# Patient Record
Sex: Female | Born: 1935 | Race: White | Hispanic: No | Smoking: Former smoker
Health system: Southern US, Community
[De-identification: ages and names within clinical notes are randomized; demographics above are authoritative.]

## PROBLEM LIST (undated history)

## (undated) DIAGNOSIS — G2581 Restless legs syndrome: Secondary | ICD-10-CM

## (undated) DIAGNOSIS — Z8719 Personal history of other diseases of the digestive system: Secondary | ICD-10-CM

## (undated) DIAGNOSIS — G629 Polyneuropathy, unspecified: Secondary | ICD-10-CM

## (undated) DIAGNOSIS — I2510 Atherosclerotic heart disease of native coronary artery without angina pectoris: Secondary | ICD-10-CM

## (undated) DIAGNOSIS — I219 Acute myocardial infarction, unspecified: Secondary | ICD-10-CM

## (undated) DIAGNOSIS — H409 Unspecified glaucoma: Secondary | ICD-10-CM

## (undated) DIAGNOSIS — E785 Hyperlipidemia, unspecified: Secondary | ICD-10-CM

## (undated) DIAGNOSIS — E039 Hypothyroidism, unspecified: Secondary | ICD-10-CM

## (undated) DIAGNOSIS — I509 Heart failure, unspecified: Secondary | ICD-10-CM

## (undated) DIAGNOSIS — M1990 Unspecified osteoarthritis, unspecified site: Secondary | ICD-10-CM

## (undated) DIAGNOSIS — N6019 Diffuse cystic mastopathy of unspecified breast: Secondary | ICD-10-CM

## (undated) DIAGNOSIS — I4891 Unspecified atrial fibrillation: Secondary | ICD-10-CM

## (undated) DIAGNOSIS — I255 Ischemic cardiomyopathy: Secondary | ICD-10-CM

## (undated) DIAGNOSIS — L309 Dermatitis, unspecified: Secondary | ICD-10-CM

## (undated) DIAGNOSIS — I10 Essential (primary) hypertension: Secondary | ICD-10-CM

## (undated) DIAGNOSIS — I499 Cardiac arrhythmia, unspecified: Secondary | ICD-10-CM

## (undated) DIAGNOSIS — I5042 Chronic combined systolic (congestive) and diastolic (congestive) heart failure: Secondary | ICD-10-CM

## (undated) DIAGNOSIS — K2970 Gastritis, unspecified, without bleeding: Secondary | ICD-10-CM

## (undated) DIAGNOSIS — N393 Stress incontinence (female) (male): Secondary | ICD-10-CM

## (undated) DIAGNOSIS — M810 Age-related osteoporosis without current pathological fracture: Secondary | ICD-10-CM

## (undated) DIAGNOSIS — G4700 Insomnia, unspecified: Secondary | ICD-10-CM

## (undated) DIAGNOSIS — R519 Headache, unspecified: Secondary | ICD-10-CM

## (undated) DIAGNOSIS — K219 Gastro-esophageal reflux disease without esophagitis: Secondary | ICD-10-CM

## (undated) DIAGNOSIS — C8590 Non-Hodgkin lymphoma, unspecified, unspecified site: Secondary | ICD-10-CM

## (undated) DIAGNOSIS — B029 Zoster without complications: Secondary | ICD-10-CM

## (undated) DIAGNOSIS — D126 Benign neoplasm of colon, unspecified: Secondary | ICD-10-CM

## (undated) DIAGNOSIS — K635 Polyp of colon: Secondary | ICD-10-CM

## (undated) DIAGNOSIS — N952 Postmenopausal atrophic vaginitis: Secondary | ICD-10-CM

## (undated) DIAGNOSIS — F419 Anxiety disorder, unspecified: Secondary | ICD-10-CM

## (undated) HISTORY — DX: Ischemic cardiomyopathy: I25.5

## (undated) HISTORY — DX: Zoster without complications: B02.9

## (undated) HISTORY — DX: Chronic combined systolic (congestive) and diastolic (congestive) heart failure: I50.42

## (undated) HISTORY — DX: Unspecified glaucoma: H40.9

## (undated) HISTORY — DX: Hyperlipidemia, unspecified: E78.5

## (undated) HISTORY — DX: Atherosclerotic heart disease of native coronary artery without angina pectoris: I25.10

## (undated) HISTORY — DX: Polyp of colon: K63.5

## (undated) HISTORY — DX: Unspecified atrial fibrillation: I48.91

## (undated) HISTORY — PX: COLONOSCOPY: SHX174

## (undated) HISTORY — DX: Essential (primary) hypertension: I10

## (undated) HISTORY — DX: Stress incontinence (female) (male): N39.3

## (undated) HISTORY — PX: PTCA: SHX146

## (undated) HISTORY — PX: CORONARY ARTERY BYPASS GRAFT: SHX141

## (undated) HISTORY — PX: TOTAL ABDOMINAL HYSTERECTOMY W/ BILATERAL SALPINGOOPHORECTOMY: SHX83

## (undated) HISTORY — PX: OTHER SURGICAL HISTORY: SHX169

## (undated) HISTORY — DX: Postmenopausal atrophic vaginitis: N95.2

## (undated) HISTORY — DX: Gastro-esophageal reflux disease without esophagitis: K21.9

## (undated) HISTORY — DX: Restless legs syndrome: G25.81

## (undated) HISTORY — DX: Insomnia, unspecified: G47.00

## (undated) HISTORY — PX: MOHS SURGERY: SUR867

## (undated) HISTORY — PX: CORONARY ANGIOPLASTY WITH STENT PLACEMENT: SHX49

## (undated) HISTORY — DX: Non-Hodgkin lymphoma, unspecified, unspecified site: C85.90

## (undated) HISTORY — PX: APPENDECTOMY: SHX54

## (undated) HISTORY — DX: Personal history of other diseases of the digestive system: Z87.19

## (undated) HISTORY — DX: Unspecified osteoarthritis, unspecified site: M19.90

## (undated) HISTORY — DX: Diffuse cystic mastopathy of unspecified breast: N60.19

---
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
## 2003-08-22 DIAGNOSIS — C8590 Non-Hodgkin lymphoma, unspecified, unspecified site: Secondary | ICD-10-CM

## 2003-08-22 HISTORY — DX: Non-Hodgkin lymphoma, unspecified, unspecified site: C85.90

## 2004-05-21 ENCOUNTER — Ambulatory Visit: Payer: Self-pay | Admitting: Oncology

## 2004-06-21 ENCOUNTER — Ambulatory Visit: Payer: Self-pay | Admitting: Oncology

## 2004-07-21 ENCOUNTER — Ambulatory Visit: Payer: Self-pay | Admitting: Oncology

## 2004-08-21 ENCOUNTER — Ambulatory Visit: Payer: Self-pay | Admitting: Oncology

## 2004-10-18 ENCOUNTER — Ambulatory Visit: Payer: Self-pay | Admitting: Oncology

## 2004-10-25 ENCOUNTER — Ambulatory Visit: Payer: Self-pay | Admitting: Oncology

## 2004-11-19 ENCOUNTER — Ambulatory Visit: Payer: Self-pay | Admitting: Oncology

## 2004-12-23 ENCOUNTER — Ambulatory Visit: Payer: Self-pay | Admitting: Unknown Physician Specialty

## 2005-01-24 ENCOUNTER — Ambulatory Visit: Payer: Self-pay | Admitting: Oncology

## 2005-02-18 ENCOUNTER — Ambulatory Visit: Payer: Self-pay | Admitting: Oncology

## 2005-03-03 ENCOUNTER — Ambulatory Visit: Payer: Self-pay | Admitting: Internal Medicine

## 2005-04-13 ENCOUNTER — Ambulatory Visit: Payer: Self-pay | Admitting: Internal Medicine

## 2005-04-18 ENCOUNTER — Ambulatory Visit: Payer: Self-pay | Admitting: Oncology

## 2005-04-21 ENCOUNTER — Ambulatory Visit: Payer: Self-pay | Admitting: Oncology

## 2005-05-21 ENCOUNTER — Ambulatory Visit: Payer: Self-pay | Admitting: Oncology

## 2005-06-21 ENCOUNTER — Ambulatory Visit: Payer: Self-pay | Admitting: Oncology

## 2005-07-21 ENCOUNTER — Ambulatory Visit: Payer: Self-pay | Admitting: Oncology

## 2005-10-03 ENCOUNTER — Ambulatory Visit: Payer: Self-pay | Admitting: Oncology

## 2005-10-19 ENCOUNTER — Ambulatory Visit: Payer: Self-pay | Admitting: Oncology

## 2005-11-20 ENCOUNTER — Ambulatory Visit: Payer: Self-pay | Admitting: Oncology

## 2005-12-19 ENCOUNTER — Ambulatory Visit: Payer: Self-pay | Admitting: Oncology

## 2006-01-19 ENCOUNTER — Ambulatory Visit: Payer: Self-pay | Admitting: Oncology

## 2006-03-23 ENCOUNTER — Ambulatory Visit: Payer: Self-pay | Admitting: Oncology

## 2006-04-18 ENCOUNTER — Ambulatory Visit: Payer: Self-pay | Admitting: Internal Medicine

## 2006-04-21 ENCOUNTER — Ambulatory Visit: Payer: Self-pay | Admitting: Oncology

## 2006-04-26 ENCOUNTER — Ambulatory Visit: Payer: Self-pay | Admitting: Internal Medicine

## 2006-06-08 ENCOUNTER — Ambulatory Visit: Payer: Self-pay | Admitting: Oncology

## 2006-06-14 ENCOUNTER — Ambulatory Visit: Payer: Self-pay | Admitting: Oncology

## 2006-06-21 ENCOUNTER — Ambulatory Visit: Payer: Self-pay | Admitting: Oncology

## 2006-09-07 ENCOUNTER — Ambulatory Visit: Payer: Self-pay | Admitting: Oncology

## 2006-09-21 ENCOUNTER — Ambulatory Visit: Payer: Self-pay | Admitting: Oncology

## 2006-10-20 ENCOUNTER — Ambulatory Visit: Payer: Self-pay | Admitting: Oncology

## 2006-10-24 ENCOUNTER — Encounter: Payer: Self-pay | Admitting: Oncology

## 2006-11-20 ENCOUNTER — Encounter: Payer: Self-pay | Admitting: Oncology

## 2006-12-10 ENCOUNTER — Ambulatory Visit: Payer: Self-pay | Admitting: Neurosurgery

## 2006-12-18 ENCOUNTER — Ambulatory Visit: Payer: Self-pay | Admitting: Neurosurgery

## 2007-02-13 ENCOUNTER — Ambulatory Visit: Payer: Self-pay | Admitting: Oncology

## 2007-02-19 ENCOUNTER — Ambulatory Visit: Payer: Self-pay | Admitting: Oncology

## 2007-04-30 ENCOUNTER — Ambulatory Visit: Payer: Self-pay | Admitting: Internal Medicine

## 2007-05-22 ENCOUNTER — Ambulatory Visit: Payer: Self-pay | Admitting: Oncology

## 2007-06-05 ENCOUNTER — Ambulatory Visit: Payer: Self-pay | Admitting: Oncology

## 2007-06-07 ENCOUNTER — Ambulatory Visit: Payer: Self-pay | Admitting: Oncology

## 2007-06-22 ENCOUNTER — Ambulatory Visit: Payer: Self-pay | Admitting: Oncology

## 2007-06-25 ENCOUNTER — Ambulatory Visit: Payer: Self-pay | Admitting: Neurosurgery

## 2007-11-20 ENCOUNTER — Ambulatory Visit: Payer: Self-pay | Admitting: Oncology

## 2007-12-06 ENCOUNTER — Ambulatory Visit: Payer: Self-pay | Admitting: Oncology

## 2007-12-20 ENCOUNTER — Ambulatory Visit: Payer: Self-pay | Admitting: Oncology

## 2008-01-20 ENCOUNTER — Ambulatory Visit: Payer: Self-pay | Admitting: Oncology

## 2008-03-21 ENCOUNTER — Ambulatory Visit: Payer: Self-pay | Admitting: Oncology

## 2008-04-13 ENCOUNTER — Ambulatory Visit: Payer: Self-pay | Admitting: Oncology

## 2008-04-21 ENCOUNTER — Ambulatory Visit: Payer: Self-pay | Admitting: Oncology

## 2008-05-01 ENCOUNTER — Ambulatory Visit: Payer: Self-pay | Admitting: Internal Medicine

## 2008-07-21 ENCOUNTER — Ambulatory Visit: Payer: Self-pay | Admitting: Oncology

## 2008-07-29 ENCOUNTER — Ambulatory Visit: Payer: Self-pay | Admitting: Oncology

## 2008-08-03 ENCOUNTER — Ambulatory Visit: Payer: Self-pay | Admitting: Oncology

## 2008-08-21 ENCOUNTER — Ambulatory Visit: Payer: Self-pay | Admitting: Oncology

## 2008-09-04 ENCOUNTER — Ambulatory Visit: Payer: Self-pay | Admitting: Oncology

## 2008-09-10 ENCOUNTER — Ambulatory Visit: Payer: Self-pay | Admitting: Unknown Physician Specialty

## 2008-09-21 ENCOUNTER — Ambulatory Visit: Payer: Self-pay | Admitting: Oncology

## 2008-09-24 ENCOUNTER — Ambulatory Visit: Payer: Self-pay | Admitting: Oncology

## 2008-10-19 ENCOUNTER — Ambulatory Visit: Payer: Self-pay | Admitting: Oncology

## 2008-11-19 ENCOUNTER — Ambulatory Visit: Payer: Self-pay | Admitting: Oncology

## 2008-12-19 ENCOUNTER — Ambulatory Visit: Payer: Self-pay | Admitting: Oncology

## 2009-02-18 ENCOUNTER — Ambulatory Visit: Payer: Self-pay | Admitting: Oncology

## 2009-03-01 ENCOUNTER — Ambulatory Visit: Payer: Self-pay | Admitting: Oncology

## 2009-03-03 ENCOUNTER — Ambulatory Visit: Payer: Self-pay | Admitting: Oncology

## 2009-03-21 ENCOUNTER — Ambulatory Visit: Payer: Self-pay | Admitting: Oncology

## 2009-03-25 ENCOUNTER — Ambulatory Visit: Payer: Self-pay | Admitting: Unknown Physician Specialty

## 2009-05-24 ENCOUNTER — Ambulatory Visit: Payer: Self-pay | Admitting: Internal Medicine

## 2009-06-21 ENCOUNTER — Ambulatory Visit: Payer: Self-pay | Admitting: Oncology

## 2009-07-01 ENCOUNTER — Ambulatory Visit: Payer: Self-pay | Admitting: Oncology

## 2009-07-21 ENCOUNTER — Ambulatory Visit: Payer: Self-pay | Admitting: Oncology

## 2009-10-19 ENCOUNTER — Ambulatory Visit: Payer: Self-pay | Admitting: Oncology

## 2009-10-29 ENCOUNTER — Ambulatory Visit: Payer: Self-pay | Admitting: Oncology

## 2009-11-19 ENCOUNTER — Ambulatory Visit: Payer: Self-pay | Admitting: Oncology

## 2010-01-19 ENCOUNTER — Ambulatory Visit: Payer: Self-pay | Admitting: Oncology

## 2010-02-14 ENCOUNTER — Ambulatory Visit: Payer: Self-pay | Admitting: Oncology

## 2010-02-17 ENCOUNTER — Ambulatory Visit: Payer: Self-pay | Admitting: Oncology

## 2010-02-18 ENCOUNTER — Ambulatory Visit: Payer: Self-pay | Admitting: Oncology

## 2010-05-21 ENCOUNTER — Ambulatory Visit: Payer: Self-pay | Admitting: Oncology

## 2010-05-26 ENCOUNTER — Ambulatory Visit: Payer: Self-pay | Admitting: Internal Medicine

## 2010-06-03 ENCOUNTER — Ambulatory Visit: Payer: Self-pay | Admitting: Oncology

## 2010-06-21 ENCOUNTER — Ambulatory Visit: Payer: Self-pay | Admitting: Oncology

## 2011-01-19 ENCOUNTER — Ambulatory Visit: Payer: Self-pay | Admitting: Oncology

## 2011-01-20 ENCOUNTER — Ambulatory Visit: Payer: Self-pay | Admitting: Oncology

## 2011-06-02 ENCOUNTER — Ambulatory Visit: Payer: Self-pay | Admitting: Internal Medicine

## 2011-07-18 ENCOUNTER — Ambulatory Visit: Payer: Self-pay | Admitting: Oncology

## 2011-07-20 ENCOUNTER — Ambulatory Visit: Payer: Self-pay | Admitting: Oncology

## 2011-07-22 ENCOUNTER — Ambulatory Visit: Payer: Self-pay | Admitting: Oncology

## 2011-07-24 HISTORY — PX: LYMPH NODE BIOPSY: SHX201

## 2011-08-22 ENCOUNTER — Ambulatory Visit: Payer: Self-pay | Admitting: Oncology

## 2011-08-30 LAB — CBC CANCER CENTER
Basophil %: 0.8 %
Eosinophil #: 0.3 x10 3/mm (ref 0.0–0.7)
HGB: 13.6 g/dL (ref 12.0–16.0)
Lymphocyte #: 2.2 x10 3/mm (ref 1.0–3.6)
Lymphocyte %: 38.9 %
Neutrophil #: 2.7 x10 3/mm (ref 1.4–6.5)
Neutrophil %: 48.3 %
Platelet: 140 x10 3/mm — ABNORMAL LOW (ref 150–440)
RBC: 4.13 10*6/uL (ref 3.80–5.20)
WBC: 5.7 x10 3/mm (ref 3.6–11.0)

## 2011-08-30 LAB — COMPREHENSIVE METABOLIC PANEL
Albumin: 3.8 g/dL (ref 3.4–5.0)
BUN: 14 mg/dL (ref 7–18)
Bilirubin,Total: 0.5 mg/dL (ref 0.2–1.0)
Chloride: 106 mmol/L (ref 98–107)
Creatinine: 0.67 mg/dL (ref 0.60–1.30)
EGFR (African American): >60
Glucose: 141 mg/dL — ABNORMAL HIGH (ref 65–99)
SGOT(AST): 13 U/L — ABNORMAL LOW (ref 15–37)
SGPT (ALT): 17 U/L
Sodium: 143 mmol/L (ref 136–145)
Total Protein: 6.3 g/dL — ABNORMAL LOW (ref 6.4–8.2)

## 2011-09-06 LAB — CBC CANCER CENTER
Basophil #: 0 x10 3/mm (ref 0.0–0.1)
Eosinophil #: 0.2 x10 3/mm (ref 0.0–0.7)
HCT: 40.8 % (ref 35.0–47.0)
Lymphocyte #: 2.3 x10 3/mm (ref 1.0–3.6)
Lymphocyte %: 34.3 %
MCH: 32.7 pg (ref 26.0–34.0)
MCHC: 34.4 g/dL (ref 32.0–36.0)
MCV: 95 fL (ref 80–100)
Monocyte #: 0.7 x10 3/mm (ref 0.0–0.7)
Neutrophil #: 3.5 x10 3/mm (ref 1.4–6.5)
Platelet: 161 x10 3/mm (ref 150–440)
RDW: 12.9 % (ref 11.5–14.5)

## 2011-09-13 LAB — CBC CANCER CENTER
Basophil #: 0.1 x10 3/mm (ref 0.0–0.1)
Eosinophil %: 2.9 %
HGB: 13.8 g/dL (ref 12.0–16.0)
Lymphocyte #: 1.9 x10 3/mm (ref 1.0–3.6)
Lymphocyte %: 27.9 %
MCH: 33.5 pg (ref 26.0–34.0)
MCV: 96 fL (ref 80–100)
Monocyte #: 0.5 x10 3/mm (ref 0.0–0.7)
Platelet: 140 x10 3/mm — ABNORMAL LOW (ref 150–440)
RDW: 13.4 % (ref 11.5–14.5)
WBC: 6.7 x10 3/mm (ref 3.6–11.0)

## 2011-09-20 LAB — CBC CANCER CENTER
Basophil #: 0 x10 3/mm (ref 0.0–0.1)
Basophil %: 0.7 %
Eosinophil #: 0.2 x10 3/mm (ref 0.0–0.7)
Eosinophil %: 3.1 %
HCT: 39.2 % (ref 35.0–47.0)
HGB: 13.8 g/dL (ref 12.0–16.0)
Lymphocyte #: 1.8 x10 3/mm (ref 1.0–3.6)
Lymphocyte %: 28.9 %
MCH: 33.6 pg (ref 26.0–34.0)
MCHC: 35.3 g/dL (ref 32.0–36.0)
MCV: 95 fL (ref 80–100)
Monocyte #: 0.6 x10 3/mm (ref 0.0–0.7)
Neutrophil #: 3.5 x10 3/mm (ref 1.4–6.5)
Neutrophil %: 58 %
Platelet: 133 x10 3/mm — ABNORMAL LOW (ref 150–440)
RBC: 4.12 10*6/uL (ref 3.80–5.20)
RDW: 13.3 % (ref 11.5–14.5)

## 2011-09-22 ENCOUNTER — Ambulatory Visit: Payer: Self-pay | Admitting: Oncology

## 2011-11-01 ENCOUNTER — Ambulatory Visit: Payer: Self-pay | Admitting: Oncology

## 2011-11-01 LAB — CBC CANCER CENTER
Basophil #: 0 x10 3/mm (ref 0.0–0.1)
Eosinophil #: 0.2 x10 3/mm (ref 0.0–0.7)
HCT: 38.8 % (ref 35.0–47.0)
HGB: 13.6 g/dL (ref 12.0–16.0)
Lymphocyte %: 34.4 %
MCH: 33.6 pg (ref 26.0–34.0)
MCHC: 35 g/dL (ref 32.0–36.0)
MCV: 96 fL (ref 80–100)
Neutrophil %: 54.9 %
Platelet: 151 x10 3/mm (ref 150–440)
RBC: 4.03 10*6/uL (ref 3.80–5.20)

## 2011-11-01 LAB — COMPREHENSIVE METABOLIC PANEL
Albumin: 3.9 g/dL (ref 3.4–5.0)
Anion Gap: 9 (ref 7–16)
Bilirubin,Total: 0.6 mg/dL (ref 0.2–1.0)
Calcium, Total: 8.7 mg/dL (ref 8.5–10.1)
Chloride: 107 mmol/L (ref 98–107)
EGFR (African American): >60
Glucose: 141 mg/dL — ABNORMAL HIGH (ref 65–99)
Osmolality: 290 (ref 275–301)
Potassium: 3.7 mmol/L (ref 3.5–5.1)
SGOT(AST): 15 U/L (ref 15–37)
Sodium: 143 mmol/L (ref 136–145)

## 2011-11-01 LAB — TSH: Thyroid Stimulating Horm: 2.49 u[IU]/mL

## 2011-11-01 LAB — MAGNESIUM: Magnesium: 2.1 mg/dL

## 2011-11-01 LAB — SEDIMENTATION RATE: Erythrocyte Sed Rate: 4 mm/hr (ref 0–30)

## 2011-11-20 ENCOUNTER — Ambulatory Visit: Payer: Self-pay | Admitting: Oncology

## 2011-12-20 ENCOUNTER — Ambulatory Visit: Payer: Self-pay | Admitting: Oncology

## 2012-01-03 LAB — COMPREHENSIVE METABOLIC PANEL
Albumin: 3.8 g/dL (ref 3.4–5.0)
Alkaline Phosphatase: 66 U/L (ref 50–136)
Anion Gap: 5 — ABNORMAL LOW (ref 7–16)
BUN: 14 mg/dL (ref 7–18)
Calcium, Total: 8.5 mg/dL (ref 8.5–10.1)
Co2: 27 mmol/L (ref 21–32)
Creatinine: 0.73 mg/dL (ref 0.60–1.30)
EGFR (African American): >60
EGFR (Non-African Amer.): >60
Osmolality: 287 (ref 275–301)
SGOT(AST): 19 U/L (ref 15–37)
SGPT (ALT): 19 U/L
Total Protein: 6.2 g/dL — ABNORMAL LOW (ref 6.4–8.2)

## 2012-01-03 LAB — CBC CANCER CENTER
Basophil #: 0.1 x10 3/mm (ref 0.0–0.1)
Eosinophil %: 3.7 %
HCT: 39.5 % (ref 35.0–47.0)
HGB: 13.8 g/dL (ref 12.0–16.0)
Lymphocyte #: 1.5 x10 3/mm (ref 1.0–3.6)
MCH: 33.3 pg (ref 26.0–34.0)
MCHC: 35 g/dL (ref 32.0–36.0)
MCV: 95 fL (ref 80–100)
Monocyte #: 0.4 x10 3/mm (ref 0.2–0.9)
Neutrophil #: 2.6 x10 3/mm (ref 1.4–6.5)
Neutrophil %: 55.2 %
Platelet: 137 x10 3/mm — ABNORMAL LOW (ref 150–440)
RBC: 4.15 10*6/uL (ref 3.80–5.20)
RDW: 12.6 % (ref 11.5–14.5)
WBC: 4.7 x10 3/mm (ref 3.6–11.0)

## 2012-01-20 ENCOUNTER — Ambulatory Visit: Payer: Self-pay | Admitting: Oncology

## 2012-02-23 ENCOUNTER — Ambulatory Visit: Payer: Self-pay | Admitting: Internal Medicine

## 2012-05-06 ENCOUNTER — Ambulatory Visit: Payer: Self-pay | Admitting: Oncology

## 2012-05-08 ENCOUNTER — Ambulatory Visit: Payer: Self-pay | Admitting: Oncology

## 2012-05-08 LAB — CBC CANCER CENTER
Basophil %: 1 %
Eosinophil #: 0.1 x10 3/mm (ref 0.0–0.7)
Eosinophil %: 2.6 %
HCT: 40.7 % (ref 35.0–47.0)
HGB: 13.8 g/dL (ref 12.0–16.0)
Lymphocyte #: 1.6 x10 3/mm (ref 1.0–3.6)
Lymphocyte %: 29.2 %
MCH: 32.8 pg (ref 26.0–34.0)
MCHC: 33.9 g/dL (ref 32.0–36.0)
MCV: 97 fL (ref 80–100)
Monocyte #: 0.4 x10 3/mm (ref 0.2–0.9)
Monocyte %: 6.9 %
Neutrophil #: 3.3 x10 3/mm (ref 1.4–6.5)
Neutrophil %: 60.3 %
Platelet: 134 x10 3/mm — ABNORMAL LOW (ref 150–440)
RBC: 4.21 10*6/uL (ref 3.80–5.20)
RDW: 13 % (ref 11.5–14.5)
WBC: 5.4 x10 3/mm (ref 3.6–11.0)

## 2012-05-08 LAB — COMPREHENSIVE METABOLIC PANEL
Anion Gap: 6 — ABNORMAL LOW (ref 7–16)
BUN: 18 mg/dL (ref 7–18)
Calcium, Total: 8.7 mg/dL (ref 8.5–10.1)
Chloride: 107 mmol/L (ref 98–107)
Co2: 29 mmol/L (ref 21–32)
EGFR (Non-African Amer.): 54 — ABNORMAL LOW
Osmolality: 288 (ref 275–301)
Potassium: 3.5 mmol/L (ref 3.5–5.1)
SGPT (ALT): 21 U/L (ref 12–78)
Sodium: 142 mmol/L (ref 136–145)

## 2012-05-08 LAB — LACTATE DEHYDROGENASE: LDH: 153 U/L (ref 81–234)

## 2012-05-21 ENCOUNTER — Ambulatory Visit: Payer: Self-pay | Admitting: Oncology

## 2012-06-26 ENCOUNTER — Ambulatory Visit: Payer: Self-pay | Admitting: Internal Medicine

## 2012-08-07 ENCOUNTER — Ambulatory Visit: Payer: Self-pay | Admitting: Oncology

## 2012-08-07 LAB — CBC CANCER CENTER
Basophil #: 0.1 x10 3/mm (ref 0.0–0.1)
Eosinophil #: 0.5 x10 3/mm (ref 0.0–0.7)
Eosinophil %: 7.8 %
Lymphocyte #: 2.2 x10 3/mm (ref 1.0–3.6)
MCHC: 34.9 g/dL (ref 32.0–36.0)
MCV: 96 fL (ref 80–100)
Monocyte %: 7.9 %
Neutrophil #: 3 x10 3/mm (ref 1.4–6.5)
Platelet: 119 x10 3/mm — ABNORMAL LOW (ref 150–440)
RBC: 4.36 10*6/uL (ref 3.80–5.20)
WBC: 6.3 x10 3/mm (ref 3.6–11.0)

## 2012-08-07 LAB — COMPREHENSIVE METABOLIC PANEL
Anion Gap: 10 (ref 7–16)
BUN: 16 mg/dL (ref 7–18)
Bilirubin,Total: 0.7 mg/dL (ref 0.2–1.0)
Calcium, Total: 8.5 mg/dL (ref 8.5–10.1)
Chloride: 105 mmol/L (ref 98–107)
EGFR (African American): >60
Potassium: 3.4 mmol/L — ABNORMAL LOW (ref 3.5–5.1)
SGOT(AST): 14 U/L — ABNORMAL LOW (ref 15–37)
Total Protein: 6.3 g/dL — ABNORMAL LOW (ref 6.4–8.2)

## 2012-08-07 LAB — LACTATE DEHYDROGENASE: LDH: 169 U/L (ref 81–246)

## 2012-08-18 DIAGNOSIS — I219 Acute myocardial infarction, unspecified: Secondary | ICD-10-CM

## 2012-08-18 HISTORY — DX: Acute myocardial infarction, unspecified: I21.9

## 2012-08-19 ENCOUNTER — Inpatient Hospital Stay: Payer: Self-pay | Admitting: Cardiovascular Disease

## 2012-08-19 DIAGNOSIS — I2510 Atherosclerotic heart disease of native coronary artery without angina pectoris: Secondary | ICD-10-CM

## 2012-08-19 DIAGNOSIS — I2109 ST elevation (STEMI) myocardial infarction involving other coronary artery of anterior wall: Secondary | ICD-10-CM

## 2012-08-19 HISTORY — PX: CARDIAC CATHETERIZATION: SHX172

## 2012-08-19 LAB — COMPREHENSIVE METABOLIC PANEL
Albumin: 4 g/dL (ref 3.4–5.0)
Alkaline Phosphatase: 79 U/L (ref 50–136)
Anion Gap: 11 (ref 7–16)
BUN: 18 mg/dL (ref 7–18)
Calcium, Total: 9.1 mg/dL (ref 8.5–10.1)
Chloride: 101 mmol/L (ref 98–107)
Co2: 23 mmol/L (ref 21–32)
Glucose: 151 mg/dL — ABNORMAL HIGH (ref 65–99)
Osmolality: 275 (ref 275–301)
Potassium: 3.6 mmol/L (ref 3.5–5.1)
SGOT(AST): 104 U/L — ABNORMAL HIGH (ref 15–37)
SGPT (ALT): 36 U/L (ref 12–78)
Total Protein: 6.8 g/dL (ref 6.4–8.2)

## 2012-08-19 LAB — CBC WITH DIFFERENTIAL/PLATELET
Basophil #: 0 10*3/uL (ref 0.0–0.1)
Basophil %: 0.4 %
Eosinophil %: 0.1 %
HGB: 13.4 g/dL (ref 12.0–16.0)
Lymphocyte %: 19.7 %
MCH: 33.4 pg (ref 26.0–34.0)
Monocyte #: 1 x10 3/mm — ABNORMAL HIGH (ref 0.2–0.9)
Monocyte %: 9 %
Neutrophil %: 70.8 %
Platelet: 144 10*3/uL — ABNORMAL LOW (ref 150–440)
RBC: 4.02 10*6/uL (ref 3.80–5.20)
WBC: 11 10*3/uL (ref 3.6–11.0)

## 2012-08-19 LAB — CBC
HGB: 15.3 g/dL (ref 12.0–16.0)
MCHC: 34.2 g/dL (ref 32.0–36.0)
RDW: 13.3 % (ref 11.5–14.5)
WBC: 16 10*3/uL — ABNORMAL HIGH (ref 3.6–11.0)

## 2012-08-19 LAB — TROPONIN I: Troponin-I: 18 ng/mL — ABNORMAL HIGH

## 2012-08-19 LAB — CK TOTAL AND CKMB (NOT AT ARMC)
CK-MB: 19.4 ng/mL — ABNORMAL HIGH (ref 0.5–3.6)
CK-MB: 17 ng/mL — ABNORMAL HIGH (ref 0.5–3.6)
CK-MB: 16.2 ng/mL — ABNORMAL HIGH (ref 0.5–3.6)

## 2012-08-20 ENCOUNTER — Other Ambulatory Visit: Payer: Self-pay | Admitting: Cardiovascular Disease

## 2012-08-20 DIAGNOSIS — I2510 Atherosclerotic heart disease of native coronary artery without angina pectoris: Secondary | ICD-10-CM

## 2012-08-20 HISTORY — DX: Atherosclerotic heart disease of native coronary artery without angina pectoris: I25.10

## 2012-08-20 LAB — BASIC METABOLIC PANEL
Anion Gap: 8 (ref 7–16)
BUN: 14 mg/dL (ref 7–18)
Calcium, Total: 7.9 mg/dL — ABNORMAL LOW (ref 8.5–10.1)
Chloride: 101 mmol/L (ref 98–107)
Co2: 24 mmol/L (ref 21–32)
Creatinine: 0.76 mg/dL (ref 0.60–1.30)
Glucose: 113 mg/dL — ABNORMAL HIGH (ref 65–99)
Osmolality: 268 (ref 275–301)
Sodium: 133 mmol/L — ABNORMAL LOW (ref 136–145)

## 2012-08-21 ENCOUNTER — Ambulatory Visit: Payer: Self-pay | Admitting: Oncology

## 2012-08-21 LAB — CBC WITH DIFFERENTIAL/PLATELET
Eosinophil #: 0 10*3/uL (ref 0.0–0.7)
HGB: 12.2 g/dL (ref 12.0–16.0)
Lymphocyte %: 22.1 %
MCHC: 35.6 g/dL (ref 32.0–36.0)
Monocyte %: 12.8 %
RBC: 3.63 10*6/uL — ABNORMAL LOW (ref 3.80–5.20)
RDW: 13.1 % (ref 11.5–14.5)

## 2012-08-21 LAB — BASIC METABOLIC PANEL
Anion Gap: 6 — ABNORMAL LOW (ref 7–16)
BUN: 9 mg/dL (ref 7–18)
Calcium, Total: 8.4 mg/dL — ABNORMAL LOW (ref 8.5–10.1)
Chloride: 109 mmol/L — ABNORMAL HIGH (ref 98–107)
EGFR (Non-African Amer.): >60
Sodium: 142 mmol/L (ref 136–145)

## 2012-08-22 DIAGNOSIS — I2109 ST elevation (STEMI) myocardial infarction involving other coronary artery of anterior wall: Secondary | ICD-10-CM

## 2012-08-22 DIAGNOSIS — I369 Nonrheumatic tricuspid valve disorder, unspecified: Secondary | ICD-10-CM

## 2012-08-22 LAB — CBC WITH DIFFERENTIAL/PLATELET
Basophil #: 0 10*3/uL (ref 0.0–0.1)
Basophil %: 0.5 %
Eosinophil #: 0.1 10*3/uL (ref 0.0–0.7)
HGB: 12.2 g/dL (ref 12.0–16.0)
Lymphocyte #: 1.6 10*3/uL (ref 1.0–3.6)
Monocyte #: 0.7 x10 3/mm (ref 0.2–0.9)
Monocyte %: 10.5 %
Neutrophil #: 3.9 10*3/uL (ref 1.4–6.5)
Neutrophil %: 61.3 %
Platelet: 139 10*3/uL — ABNORMAL LOW (ref 150–440)
RBC: 3.64 10*6/uL — ABNORMAL LOW (ref 3.80–5.20)
RDW: 13 % (ref 11.5–14.5)

## 2012-08-23 ENCOUNTER — Encounter: Payer: Self-pay | Admitting: *Deleted

## 2012-08-23 ENCOUNTER — Other Ambulatory Visit: Payer: Self-pay | Admitting: Cardiovascular Disease

## 2012-08-23 ENCOUNTER — Telehealth: Payer: Self-pay

## 2012-08-23 LAB — BASIC METABOLIC PANEL
BUN: 8 mg/dL (ref 7–18)
Calcium, Total: 8.3 mg/dL — ABNORMAL LOW (ref 8.5–10.1)
Chloride: 108 mmol/L — ABNORMAL HIGH (ref 98–107)
Creatinine: 0.73 mg/dL (ref 0.60–1.30)
EGFR (African American): >60
EGFR (Non-African Amer.): >60
Glucose: 106 mg/dL — ABNORMAL HIGH (ref 65–99)

## 2012-08-23 MED ORDER — SPIRONOLACTONE 25 MG PO TABS: 25.0000 mg | ORAL_TABLET | Freq: Every day | ORAL | Status: DC

## 2012-08-23 MED ORDER — AMIODARONE HCL 200 MG PO TABS: 200.0000 mg | ORAL_TABLET | Freq: Two times a day (BID) | ORAL | Status: DC

## 2012-08-23 MED ORDER — LISINOPRIL 2.5 MG PO TABS: 2.5000 mg | ORAL_TABLET | Freq: Every day | ORAL | Status: DC

## 2012-08-23 MED ORDER — ATORVASTATIN CALCIUM 40 MG PO TABS: 40.0000 mg | ORAL_TABLET | Freq: Every day | ORAL | Status: DC

## 2012-08-23 MED ORDER — AMIODARONE HCL 200 MG PO TABS: 200.0000 mg | ORAL_TABLET | Freq: Every day | ORAL | Status: DC

## 2012-08-23 MED ORDER — TICAGRELOR 90 MG PO TABS: 90.0000 mg | ORAL_TABLET | Freq: Two times a day (BID) | ORAL | Status: DC

## 2012-08-23 MED ORDER — CARVEDILOL 3.125 MG PO TABS: 3.1250 mg | ORAL_TABLET | Freq: Two times a day (BID) | ORAL | Status: DC

## 2012-08-23 NOTE — Telephone Encounter (Signed)
TCM call D/c today 08/23/12 Will try #1 TCM 08/26/12

## 2012-08-23 NOTE — Telephone Encounter (Signed)
Message copied by [place], Anavictoria Wilk E on Fri Aug 23, 2012 12:40 PM ------      Message from: Oneida Arenas      Created: Fri Aug 23, 2012 12:35 PM      Regarding: tcm       Pt being dx today from [facility]

## 2012-08-24 ENCOUNTER — Ambulatory Visit: Payer: Self-pay | Admitting: Oncology

## 2012-08-25 ENCOUNTER — Telehealth: Payer: Self-pay | Admitting: Nurse Practitioner

## 2012-08-25 NOTE — Telephone Encounter (Signed)
Received a call from Jeanne Pittman, son of Jeanne Pittman on the evening of 1/4.  Pt was recently d/c'd following MI (per pt, records not available at time of phone call), and was placed on coreg 3.125mg  bid and lisinopril 2.5mg  daily.  Her BP has been running in the 90's and she has been feeling fatigued and dizzy.  I recommended that they reduce her coreg to 1/2 tab bid and that if her systolic bp is <46mmHg prior to a dose, that that dose be held.  Cont lisinopril as Rx for now, though this may need to be d/c'd if pressures remain soft.  Son verbalized understanding and will call back if he has any additional questions.

## 2012-08-26 ENCOUNTER — Encounter: Payer: Self-pay | Admitting: Cardiovascular Disease

## 2012-08-26 ENCOUNTER — Ambulatory Visit (INDEPENDENT_AMBULATORY_CARE_PROVIDER_SITE_OTHER): Payer: Medicare Other | Admitting: Cardiovascular Disease

## 2012-08-26 VITALS — BP 140/70 | HR 68 | Ht 62.0 in | Wt 127.2 lb

## 2012-08-26 DIAGNOSIS — I509 Heart failure, unspecified: Secondary | ICD-10-CM

## 2012-08-26 DIAGNOSIS — I2510 Atherosclerotic heart disease of native coronary artery without angina pectoris: Secondary | ICD-10-CM

## 2012-08-26 DIAGNOSIS — I5022 Chronic systolic (congestive) heart failure: Secondary | ICD-10-CM

## 2012-08-26 DIAGNOSIS — I4891 Unspecified atrial fibrillation: Secondary | ICD-10-CM

## 2012-08-26 MED ORDER — FUROSEMIDE 20 MG PO TABS: 20.0000 mg | ORAL_TABLET | Freq: Every day | ORAL | Status: DC

## 2012-08-26 MED ORDER — LOSARTAN POTASSIUM 25 MG PO TABS: 12.5000 mg | ORAL_TABLET | Freq: Every day | ORAL | Status: DC

## 2012-08-26 NOTE — Telephone Encounter (Signed)
TCM call: dtr in law says pt is "not feeling well" C/O orthopnea, palpitations and diarrhea x 2 days afebrile BP is better since decreasing coreg by 1/2 (See [facility] note 08/24/12) BP this am=123/52 HR=57-65 BPM Has appt Wednesday 1/8 for hospital f/u Says pt has to "prop up" at hs to keep from feeling as if she is "sufficating" Unsure as to if pt in atrial fib or NSR Advised to come in today versus waiting until Wednesday 1/8 appt made with Dr. Kirke Corin today at 1100 dtr in law verb understanding

## 2012-08-26 NOTE — Telephone Encounter (Signed)
Pt daughter in law calling concerning pt BP. Pt is also experiencing SOB.

## 2012-08-26 NOTE — Patient Instructions (Addendum)
Labs today.  Stop Lisinopril.  Start Losartan 12.5 mg (1/2 tablet of 25 mg) once daily.  Start Furosemide (Lasix) 20 mg once daily.  Follow up in 1 week.

## 2012-08-27 ENCOUNTER — Encounter: Payer: Self-pay | Admitting: Cardiovascular Disease

## 2012-08-27 DIAGNOSIS — I5022 Chronic systolic (congestive) heart failure: Secondary | ICD-10-CM | POA: Insufficient documentation

## 2012-08-27 DIAGNOSIS — I2510 Atherosclerotic heart disease of native coronary artery without angina pectoris: Secondary | ICD-10-CM | POA: Insufficient documentation

## 2012-08-27 DIAGNOSIS — I4891 Unspecified atrial fibrillation: Secondary | ICD-10-CM | POA: Insufficient documentation

## 2012-08-27 LAB — CBC WITH DIFFERENTIAL/PLATELET
Basos: 1 % (ref 0–3)
Eos: 3 % (ref 0–5)
Eosinophils Absolute: 0.2 10*3/uL (ref 0.0–0.4)
HCT: 39.4 % (ref 34.0–46.6)
Immature Grans (Abs): 0.1 10*3/uL (ref 0.0–0.1)
Lymphocytes Absolute: 1.7 10*3/uL (ref 0.7–3.1)
MCH: 32.5 pg (ref 26.6–33.0)
MCV: 95 fL (ref 79–97)
Monocytes Absolute: 0.6 10*3/uL (ref 0.1–0.9)
Neutrophils Relative %: 60 % (ref 40–74)
RBC: 4.16 x10E6/uL (ref 3.77–5.28)
RDW: 14 % (ref 12.3–15.4)

## 2012-08-27 LAB — BASIC METABOLIC PANEL
BUN/Creatinine Ratio: 10 — ABNORMAL LOW (ref 11–26)
Calcium: 9.3 mg/dL (ref 8.6–10.2)
Creatinine, Ser: 0.9 mg/dL (ref 0.57–1.00)
GFR calc non Af Amer: 63 mL/min/{1.73_m2} (ref >59)
Potassium: 5.2 mmol/L (ref 3.5–5.2)
Sodium: 140 mmol/L (ref 134–144)

## 2012-08-27 LAB — HEPATIC FUNCTION PANEL
Albumin: 4.2 g/dL (ref 3.5–4.8)
Alkaline Phosphatase: 69 IU/L (ref 39–117)
Bilirubin, Direct: 0.21 mg/dL (ref 0.00–0.40)
Total Protein: 6.3 g/dL (ref 6.0–8.5)

## 2012-08-27 NOTE — Assessment & Plan Note (Signed)
She is having symptoms of orthopnea and PND waking her up from sleep and associated with back discomfort similar to her presentation with myocardial infarction. I suspect that this is due to fluid overload. Thus, I will start her today on furosemide 20 mg once daily. I will also check routine labs today given that she is on spironolactone.  Ejection fraction was 25-35%. I will try to slowly advance her heart failure medications. She is having dry cough likely related to lisinopril. Thus, I will switch this to losartan 12.5 mg once daily.

## 2012-08-27 NOTE — Assessment & Plan Note (Addendum)
The patient suffered a major anterior myocardial infarction with significant LV systolic dysfunction. Continue dual antiplatelet therapy. She seems to be having mostly nocturnal angina likely due to fluid overload. She also seems to be depressed which is likely related to her recent myocardial infarction. If there's no improvement upon followup, an SSRI will be considered.

## 2012-08-27 NOTE — Assessment & Plan Note (Signed)
She is currently in sinus rhythm on amiodarone. She is complaining of nausea which might be related to amiodarone. I will check her liver profile to make sure that there is no evidence of liver toxicity. I would consider decreasing the dose of amiodarone 200 mg once daily upon followup. The patient was not started on anticoagulation due to the need for dual antiplatelet therapy and presence of mild thrombocytopenia. If she develops recurrent atrial fibrillation or if there is evidence of left ventricular thrombus, I will switch her to warfarin in the near future.

## 2012-08-27 NOTE — Progress Notes (Signed)
HPI  This is a pleasant 78 year old female who is here today for followup visit. She presented on December 30 with chest pain of 2 days' duration. She was noted to have anterior ST elevation with Q waves. She was in A. fib with RVR and was and cardiogenic shock requiring levophed. I performed emergent cardiac catheterization which showed an occluded mid LAD and 95% stenosis in the proximal RCA. 2 drug-eluting stents were placed in the mid LAD and 1 drug-eluting stent to the proximal RCA. She was given IV amiodarone and subsequently converted to sinus rhythm after RCA PCI. Ejection fraction was 25-35% with akinesis of the mid distal anterior, apical and distal inferior wall. She had sluggish flow in the LAD post PCI likely due to late presentation. She did reasonably well. She had recurrent atrial fibrillation that required IV amiodarone again. Since hospital discharge, she continues to complain of sudden episodes of dyspnea, palpitations and back discomfort happening mainly while she is asleep around 2 or 3:00 in the morning. This wakes her up from sleep. She also has to urinate frequently at night. She is still very weak. Her blood pressure was running low and the dose of Coreg was decreased to half a tablet twice daily.  Allergies  Allergen Reactions  . Ace Inhibitors   . Benadryl (Diphenhydramine Hcl)   . Codeine   . Hydrocodone      Current Outpatient Prescriptions on File Prior to Visit  Medication Sig Dispense Refill  . amiodarone (PACERONE) 200 MG tablet Take 1 tablet (200 mg total) by mouth 2 (two) times daily.  60 tablet  6  . aspirin 81 MG tablet Take 81 mg by mouth daily.      [name] atorvastatin (LIPITOR) 40 MG tablet Take 1 tablet (40 mg total) by mouth at bedtime.  30 tablet  6  . carvedilol (COREG) 3.125 MG tablet Takes 1/2 tablet twice daily.      [name] LORazepam (ATIVAN) 0.5 MG tablet Take 0.5 mg by mouth as needed.      [name] omeprazole (PRILOSEC) 20 MG capsule Take 20 mg by mouth 2  (two) times daily.      [name] oxybutynin (DITROPAN-XL) 10 MG 24 hr tablet Take 10 mg by mouth as needed.      [name] spironolactone (ALDACTONE) 25 MG tablet Take 1 tablet (25 mg total) by mouth daily.  30 tablet  6  . Ticagrelor (BRILINTA) 90 MG TABS tablet Take 1 tablet (90 mg total) by mouth 2 (two) times daily.  60 tablet  6  . furosemide (LASIX) 20 MG tablet Take 1 tablet (20 mg total) by mouth daily.  30 tablet  3  . losartan (COZAAR) 25 MG tablet Take 0.5 tablets (12.5 mg total) by mouth daily.  30 tablet  6     Past Medical History  Diagnosis Date  . Urinary incontinence   . Restless leg syndrome   . Non Hodgkin's lymphoma   . Chronic systolic heart failure     EF 41-32% due to ischemic cardiomyopathy with akinesis of mid distal anterior, apical and distal inferior wall  . Coronary artery disease 08/20/12    ST elevation myocardial infarction with late presentation and cardiogenic shock. Cardiac catheterization showed occluded mid LAD and 95% stenosis in proximal RCA. She had 2 drug-eluting stent placements to the mid LAD and one drug-eluting stent placement to the proximal RCA. Ejection fraction was 25%.  . A-fib     A. fib with RVR in  the setting of myocardial infarction. Converted to sinus rhythm with amiodarone.     Past Surgical History  Procedure Date  . Cardiac catheterization 08/19/2012    [name]; [facility]     Family History  Problem Relation Age of Onset  . Heart attack Father   . Heart disease Brother      History   Social History  . Marital Status: Divorced    Spouse Name: N/A    Number of Children: N/A  . Years of Education: N/A   Occupational History  . Not on file.   Social History Main Topics  . Smoking status: Never Smoker   . Smokeless tobacco: Not on file  . Alcohol Use: No  . Drug Use: No  . Sexually Active:    Other Topics Concern  . Not on file   Social History Narrative  . No narrative on file     PHYSICAL EXAM   BP 140/70  Pulse 68   Ht 5\' 2"  (1.575 m)  Wt 127 lb 4 oz (57.72 kg)  BMI 23.27 kg/m2 Constitutional: She is oriented to person, place, and time. She appears well-developed and well-nourished. No distress.  HENT: No nasal discharge.  Head: Normocephalic and atraumatic.  Eyes: Pupils are equal and round. Right eye exhibits no discharge. Left eye exhibits no discharge.  Neck: Normal range of motion. Neck supple. No JVD present. No thyromegaly present.  Cardiovascular: Normal rate, regular rhythm, normal heart sounds. Exam reveals no gallop and no friction rub. No murmur heard.  Pulmonary/Chest: Effort normal and breath sounds normal. No stridor. No respiratory distress. She has no wheezes. She has no rales. She exhibits no tenderness.  Abdominal: Soft. Bowel sounds are normal. She exhibits no distension. There is no tenderness. There is no rebound and no guarding.  Musculoskeletal: Normal range of motion. She exhibits no edema and no tenderness.  Neurological: She is alert and oriented to person, place, and time. Coordination normal.  Skin: Skin is warm and dry. No rash noted. She is not diaphoretic. No erythema. No pallor.  Psychiatric: She has a normal mood and affect. Her behavior is normal. Judgment and thought content normal.     EKG: Sinus  Rhythm  -Right atrial enlargement.   -Old anterior infarct  -  Nonspecific T-abnormality.   Low voltage with rightward P-axis and rotation -possible pulmonary disease.   ABNORMAL    ASSESSMENT AND PLAN

## 2012-08-28 ENCOUNTER — Encounter: Payer: Medicare Other | Admitting: Nurse Practitioner

## 2012-09-06 ENCOUNTER — Ambulatory Visit (INDEPENDENT_AMBULATORY_CARE_PROVIDER_SITE_OTHER): Payer: Medicare Other | Admitting: Cardiovascular Disease

## 2012-09-06 ENCOUNTER — Encounter: Payer: Self-pay | Admitting: Cardiovascular Disease

## 2012-09-06 VITALS — BP 112/68 | HR 62 | Ht 62.0 in | Wt 121.8 lb

## 2012-09-06 DIAGNOSIS — I5022 Chronic systolic (congestive) heart failure: Secondary | ICD-10-CM

## 2012-09-06 DIAGNOSIS — I4891 Unspecified atrial fibrillation: Secondary | ICD-10-CM

## 2012-09-06 DIAGNOSIS — I25810 Atherosclerosis of coronary artery bypass graft(s) without angina pectoris: Secondary | ICD-10-CM

## 2012-09-06 DIAGNOSIS — I2510 Atherosclerotic heart disease of native coronary artery without angina pectoris: Secondary | ICD-10-CM

## 2012-09-06 MED ORDER — AMIODARONE HCL 200 MG PO TABS: 200.0000 mg | ORAL_TABLET | Freq: Every day | ORAL | Status: DC

## 2012-09-06 NOTE — Patient Instructions (Addendum)
Labs today.  Decrease Amiodarone to 200 mg once daily.  Needs a life vest.  Refer to cardiac rehab.  Follow up in 1 month.

## 2012-09-07 LAB — BASIC METABOLIC PANEL
BUN/Creatinine Ratio: 22 (ref 11–26)
BUN: 24 mg/dL (ref 8–27)
Chloride: 96 mmol/L — ABNORMAL LOW (ref 97–108)
GFR calc Af Amer: 57 mL/min/{1.73_m2} — ABNORMAL LOW (ref >59)
Sodium: 134 mmol/L (ref 134–144)

## 2012-09-10 ENCOUNTER — Other Ambulatory Visit: Payer: Self-pay

## 2012-09-10 DIAGNOSIS — I509 Heart failure, unspecified: Secondary | ICD-10-CM

## 2012-09-10 MED ORDER — FUROSEMIDE 20 MG PO TABS: 20.0000 mg | ORAL_TABLET | ORAL | Status: DC

## 2012-09-14 NOTE — Progress Notes (Signed)
HPI  This is a pleasant 78 year old female who is here today for followup visit. She presented on December 30 with chest pain of 2 days' duration. She was noted to have anterior ST elevation with Q waves. She was in A. fib with RVR and was and cardiogenic shock requiring levophed. I performed emergent cardiac catheterization which showed an occluded mid LAD and 95% stenosis in the proximal RCA. 2 drug-eluting stents were placed in the mid LAD and 1 drug-eluting stent to the proximal RCA. She was given IV amiodarone and subsequently converted to sinus rhythm after RCA PCI. Ejection fraction was 25-30% with akinesis of the mid distal anterior, apical and distal inferior wall. She had sluggish flow in the LAD post PCI likely due to late presentation. She did reasonably well. She had recurrent atrial fibrillation that required IV amiodarone again. During her last visit, she had symptoms suggestive of orthopnea and PND. Thus, I started her on small dose furosemide 20 mg once daily. Overall she is feeling better. She denies exertional chest pain. Dyspnea improved. No lower extremity edema.  Allergies  Allergen Reactions  . Ace Inhibitors   . Benadryl (Diphenhydramine Hcl)   . Codeine   . Hydrocodone      Current Outpatient Prescriptions on File Prior to Visit  Medication Sig Dispense Refill  . amiodarone (PACERONE) 200 MG tablet Take 1 tablet (200 mg total) by mouth daily.  60 tablet  6  . aspirin 81 MG tablet Take 81 mg by mouth daily.      [name] atorvastatin (LIPITOR) 40 MG tablet Take 1 tablet (40 mg total) by mouth at bedtime.  30 tablet  6  . carvedilol (COREG) 3.125 MG tablet Takes 1/2 tablet twice daily.      [name] losartan (COZAAR) 25 MG tablet Take 0.5 tablets (12.5 mg total) by mouth daily.  30 tablet  6  . spironolactone (ALDACTONE) 25 MG tablet Take 1 tablet (25 mg total) by mouth daily.  30 tablet  6  . Ticagrelor (BRILINTA) 90 MG TABS tablet Take 1 tablet (90 mg total) by mouth 2 (two)  times daily.  60 tablet  6  . furosemide (LASIX) 20 MG tablet Take 1 tablet (20 mg total) by mouth every other day.  30 tablet  3     Past Medical History  Diagnosis Date  . Urinary incontinence   . Restless leg syndrome   . Non Hodgkin's lymphoma   . Chronic systolic heart failure     EF 16-10% due to ischemic cardiomyopathy with akinesis of mid distal anterior, apical and distal inferior wall  . Coronary artery disease 08/20/12    ST elevation myocardial infarction with late presentation and cardiogenic shock. Cardiac catheterization showed occluded mid LAD and 95% stenosis in proximal RCA. She had 2 drug-eluting stent placements to the mid LAD and one drug-eluting stent placement to the proximal RCA. Ejection fraction was 25%.  . A-fib     A. fib with RVR in the setting of myocardial infarction. Converted to sinus rhythm with amiodarone.     Past Surgical History  Procedure Date  . Cardiac catheterization 08/19/2012    [name]; [facility]     Family History  Problem Relation Age of Onset  . Heart attack Father   . Heart disease Brother      History   Social History  . Marital Status: Divorced    Spouse Name: N/A    Number of Children: N/A  . Years of  Education: N/A   Occupational History  . Not on file.   Social History Main Topics  . Smoking status: Never Smoker   . Smokeless tobacco: Not on file  . Alcohol Use: No  . Drug Use: No  . Sexually Active:    Other Topics Concern  . Not on file   Social History Narrative  . No narrative on file     PHYSICAL EXAM   BP 112/68  Pulse 62  Ht 5\' 2"  (1.575 m)  Wt 121 lb 12 oz (55.225 kg)  BMI 22.27 kg/m2 Constitutional: She is oriented to person, place, and time. She appears well-developed and well-nourished. No distress.  HENT: No nasal discharge.  Head: Normocephalic and atraumatic.  Eyes: Pupils are equal and round. Right eye exhibits no discharge. Left eye exhibits no discharge.  Neck: Normal range of  motion. Neck supple. No JVD present. No thyromegaly present.  Cardiovascular: Normal rate, regular rhythm, normal heart sounds. Exam reveals no gallop and no friction rub. No murmur heard.  Pulmonary/Chest: Effort normal and breath sounds normal. No stridor. No respiratory distress. She has no wheezes. She has no rales. She exhibits no tenderness.  Abdominal: Soft. Bowel sounds are normal. She exhibits no distension. There is no tenderness. There is no rebound and no guarding.  Musculoskeletal: Normal range of motion. She exhibits no edema and no tenderness.  Neurological: She is alert and oriented to person, place, and time. Coordination normal.  Skin: Skin is warm and dry. No rash noted. She is not diaphoretic. No erythema. No pallor.  Psychiatric: She has a normal mood and affect. Her behavior is normal. Judgment and thought content normal.      ASSESSMENT AND PLAN

## 2012-09-14 NOTE — Assessment & Plan Note (Signed)
Continue dual antiplatelet therapy. No symptoms suggestive of recurrent angina. She is actually feeling significantly better since her last visit 2 weeks ago. I will refer her to cardiac rehabilitation.

## 2012-09-14 NOTE — Assessment & Plan Note (Signed)
Symptoms of orthopnea and PND improved significantly after starting  furosemide 20 mg once daily. I will request basic metabolic profile today given that she is also on spironolactone. Dry cough resolved after switching her from lisinopril to losartan. Ejection fraction was 25-30% with akinesia is of the mid distal anterior and apical walls. Due to that, she is at high risk for ventricular arrhythmia and sudden cardiac death. Thus, I recommend a life vest until we recheck her ejection fraction and determine if an ICD is needed.

## 2012-09-14 NOTE — Assessment & Plan Note (Signed)
She is currently in sinus rhythm on amiodarone. I will decrease the dose to 200 mg once daily. The patient was not started on anticoagulation due to the need for dual antiplatelet therapy and presence of mild thrombocytopenia. If she develops recurrent atrial fibrillation or if there is evidence of left ventricular thrombus, I will switch her to warfarin in the near future. If she continues to be in sinus rhythm upon followup, I will consider stopping amiodarone after 6 months.

## 2012-09-19 ENCOUNTER — Encounter: Payer: Self-pay | Admitting: Cardiovascular Disease

## 2012-09-21 ENCOUNTER — Encounter: Payer: Self-pay | Admitting: Cardiovascular Disease

## 2012-09-23 ENCOUNTER — Ambulatory Visit (INDEPENDENT_AMBULATORY_CARE_PROVIDER_SITE_OTHER): Payer: Medicare Other

## 2012-09-23 DIAGNOSIS — I509 Heart failure, unspecified: Secondary | ICD-10-CM

## 2012-09-24 ENCOUNTER — Other Ambulatory Visit: Payer: Medicare Other

## 2012-09-24 LAB — BASIC METABOLIC PANEL
BUN/Creatinine Ratio: 19 (ref 11–26)
GFR calc Af Amer: 61 mL/min/{1.73_m2} (ref >59)
GFR calc non Af Amer: 53 mL/min/{1.73_m2} — ABNORMAL LOW (ref >59)
Glucose: 94 mg/dL (ref 65–99)
Potassium: 4.8 mmol/L (ref 3.5–5.2)

## 2012-10-07 ENCOUNTER — Encounter: Payer: Self-pay | Admitting: Cardiovascular Disease

## 2012-10-07 ENCOUNTER — Ambulatory Visit (INDEPENDENT_AMBULATORY_CARE_PROVIDER_SITE_OTHER): Payer: Medicare Other | Admitting: Cardiovascular Disease

## 2012-10-07 VITALS — BP 130/68 | HR 65 | Ht 62.0 in | Wt 121.0 lb

## 2012-10-07 DIAGNOSIS — R0609 Other forms of dyspnea: Secondary | ICD-10-CM

## 2012-10-07 DIAGNOSIS — I2510 Atherosclerotic heart disease of native coronary artery without angina pectoris: Secondary | ICD-10-CM

## 2012-10-07 DIAGNOSIS — I4891 Unspecified atrial fibrillation: Secondary | ICD-10-CM

## 2012-10-07 DIAGNOSIS — I5022 Chronic systolic (congestive) heart failure: Secondary | ICD-10-CM

## 2012-10-07 DIAGNOSIS — R0600 Dyspnea, unspecified: Secondary | ICD-10-CM

## 2012-10-07 MED ORDER — AMIODARONE HCL 200 MG PO TABS: 200.0000 mg | ORAL_TABLET | Freq: Every day | ORAL | Status: DC

## 2012-10-07 MED ORDER — CARVEDILOL 3.125 MG PO TABS: 3.1250 mg | ORAL_TABLET | Freq: Two times a day (BID) | ORAL | Status: DC

## 2012-10-07 MED ORDER — LOSARTAN POTASSIUM 25 MG PO TABS: 25.0000 mg | ORAL_TABLET | Freq: Every day | ORAL | Status: DC

## 2012-10-07 NOTE — Assessment & Plan Note (Signed)
She continues to be in normal sinus rhythm. Decrease amiodarone to 200 mg once daily. I will likely start this medication in few months given that her A. fib was in the setting of myocardial infarction and cardiogenic shock.

## 2012-10-07 NOTE — Progress Notes (Signed)
HPI  This is a pleasant 78 year old female who is here today for followup visit. She presented on December 28 with chest pain of 2 days' duration. She was noted to have anterior ST elevation with Q waves. She was in A. fib with RVR and was and cardiogenic shock requiring levophed. I performed emergent cardiac catheterization which showed an occluded mid LAD and 95% stenosis in the proximal RCA. 2 drug-eluting stents were placed in the mid LAD and 1 drug-eluting stent to the proximal RCA. She was given IV amiodarone and subsequently converted to sinus rhythm after RCA PCI. Ejection fraction was 25-30% with akinesis of the mid distal anterior, apical and distal inferior wall. She had sluggish flow in the LAD post PCI likely due to late presentation. She did reasonably well. She had recurrent atrial fibrillation that required IV amiodarone again. She had mild fluid overload post hospital discharge and was started on furosemide. She is now attending cardiac rehabilitation and wearing a life vest. She denies any chest pain. She still complains of dyspnea with minimal activities that things are improving. No palpitations.  Allergies  Allergen Reactions  . Ace Inhibitors   . Benadryl (Diphenhydramine Hcl)   . Codeine   . Hydrocodone      Current Outpatient Prescriptions on File Prior to Visit  Medication Sig Dispense Refill  . aspirin 81 MG tablet Take 81 mg by mouth daily.      [name] atorvastatin (LIPITOR) 40 MG tablet Take 1 tablet (40 mg total) by mouth at bedtime.  30 tablet  6  . furosemide (LASIX) 20 MG tablet Take 1 tablet (20 mg total) by mouth every other day.  30 tablet  3  . omeprazole (PRILOSEC) 40 MG capsule Take 40 mg by mouth 2 (two) times daily.      [name] spironolactone (ALDACTONE) 25 MG tablet Take 1 tablet (25 mg total) by mouth daily.  30 tablet  6  . Ticagrelor (BRILINTA) 90 MG TABS tablet Take 1 tablet (90 mg total) by mouth 2 (two) times daily.  60 tablet  6   No current  facility-administered medications on file prior to visit.     Past Medical History  Diagnosis Date  . Urinary incontinence   . Restless leg syndrome   . Non Hodgkin's lymphoma   . Chronic systolic heart failure     EF 16-10% due to ischemic cardiomyopathy with akinesis of mid distal anterior, apical and distal inferior wall  . Coronary artery disease 08/20/12    ST elevation myocardial infarction with late presentation and cardiogenic shock. Cardiac catheterization showed occluded mid LAD and 95% stenosis in proximal RCA. She had 2 drug-eluting stent placements to the mid LAD and one drug-eluting stent placement to the proximal RCA. Ejection fraction was 25%.  . A-fib     A. fib with RVR in the setting of myocardial infarction. Converted to sinus rhythm with amiodarone.     Past Surgical History  Procedure Laterality Date  . Cardiac catheterization  08/19/2012    [name]; [facility]     Family History  Problem Relation Age of Onset  . Heart attack Father   . Heart disease Brother      History   Social History  . Marital Status: Divorced    Spouse Name: N/A    Number of Children: N/A  . Years of Education: N/A   Occupational History  . Not on file.   Social History Main Topics  . Smoking status: Never  Smoker   . Smokeless tobacco: Not on file  . Alcohol Use: No  . Drug Use: No  . Sexually Active:    Other Topics Concern  . Not on file   Social History Narrative  . No narrative on file     PHYSICAL EXAM   BP 130/68  Pulse 65  Ht 5\' 2"  (1.575 m)  Wt 121 lb (54.885 kg)  BMI 22.13 kg/m2 Constitutional: She is oriented to person, place, and time. She appears well-developed and well-nourished. No distress.  HENT: No nasal discharge.  Head: Normocephalic and atraumatic.  Eyes: Pupils are equal and round. Right eye exhibits no discharge. Left eye exhibits no discharge.  Neck: Normal range of motion. Neck supple. No JVD present. No thyromegaly present.    Cardiovascular: Normal rate, regular rhythm, normal heart sounds. Exam reveals no gallop and no friction rub. No murmur heard.  Pulmonary/Chest: Effort normal and breath sounds normal. No stridor. No respiratory distress. She has no wheezes. She has no rales. She exhibits no tenderness.  Abdominal: Soft. Bowel sounds are normal. She exhibits no distension. There is no tenderness. There is no rebound and no guarding.  Musculoskeletal: Normal range of motion. She exhibits no edema and no tenderness.  Neurological: She is alert and oriented to person, place, and time. Coordination normal.  Skin: Skin is warm and dry. No rash noted. She is not diaphoretic. No erythema. No pallor.  Psychiatric: She has a normal mood and affect. Her behavior is normal. Judgment and thought content normal.    JYN:WGNFA  Rhythm  Low voltage in precordial leads.   -Anterior infarct -age undetermined.   -  Negative T-waves  -Possible  Anterolateral  ischemia.   ABNORMAL   ASSESSMENT AND PLAN

## 2012-10-07 NOTE — Assessment & Plan Note (Signed)
She has no symptoms of angina. Continue medical therapy. She is having significant bruising on Brilinta. I will consider switching this to Plavix in few months.

## 2012-10-07 NOTE — Patient Instructions (Addendum)
Your physician has requested that you have an echocardiogram. Echocardiography is a painless test that uses sound waves to create images of your heart. It provides your doctor with information about the size and shape of your heart and how well your heart's chambers and valves are working. This procedure takes approximately one hour. There are no restrictions for this procedure.  Decrease Amiodarone to 200 mg once daily.  Increase Carvedilol to 3.125 mg twice daily.  Increase Losartan to 25 mg once daily.   Follow up in 1 month.

## 2012-10-07 NOTE — Assessment & Plan Note (Signed)
I will increase Coreg to 3.125 mg twice daily and losartan to 25 mg once daily. Continue other medications. She is on Lasix 20 mg every other day and appears to be euvolemic. I will request an echocardiogram to evaluate LV systolic function. His ejection fraction is still less than 35%, I will refer her for an ICD placement. She is currently wearing a life vest.

## 2012-10-14 ENCOUNTER — Emergency Department: Payer: Self-pay | Admitting: Emergency Medicine

## 2012-10-14 LAB — TROPONIN I
Troponin-I: <0.02 ng/mL
Troponin-I: 0.02 ng/mL

## 2012-10-14 LAB — PROTIME-INR
INR: 1
Prothrombin Time: 13.1 secs (ref 11.5–14.7)

## 2012-10-14 LAB — COMPREHENSIVE METABOLIC PANEL
Albumin: 4.2 g/dL (ref 3.4–5.0)
Alkaline Phosphatase: 79 U/L (ref 50–136)
Anion Gap: 7 (ref 7–16)
Bilirubin,Total: 0.8 mg/dL (ref 0.2–1.0)
Calcium, Total: 8.9 mg/dL (ref 8.5–10.1)
Chloride: 108 mmol/L — ABNORMAL HIGH (ref 98–107)
Co2: 25 mmol/L (ref 21–32)
Creatinine: 1.2 mg/dL (ref 0.60–1.30)
EGFR (Non-African Amer.): 44 — ABNORMAL LOW
Glucose: 85 mg/dL (ref 65–99)
Osmolality: 282 (ref 275–301)
SGOT(AST): 25 U/L (ref 15–37)
SGPT (ALT): 33 U/L (ref 12–78)
Sodium: 140 mmol/L (ref 136–145)
Total Protein: 7 g/dL (ref 6.4–8.2)

## 2012-10-14 LAB — CBC
HGB: 13.1 g/dL (ref 12.0–16.0)
MCHC: 34.7 g/dL (ref 32.0–36.0)
MCV: 96 fL (ref 80–100)
Platelet: 173 10*3/uL (ref 150–440)
RBC: 3.93 10*6/uL (ref 3.80–5.20)
WBC: 6 10*3/uL (ref 3.6–11.0)

## 2012-10-14 LAB — CK TOTAL AND CKMB (NOT AT ARMC)
CK, Total: 35 U/L (ref 21–215)
CK-MB: <0.5 ng/mL — ABNORMAL LOW (ref 0.5–3.6)

## 2012-10-14 LAB — APTT: Activated PTT: 24.3 secs (ref 23.6–35.9)

## 2012-10-15 ENCOUNTER — Other Ambulatory Visit (INDEPENDENT_AMBULATORY_CARE_PROVIDER_SITE_OTHER): Payer: Medicare Other

## 2012-10-15 ENCOUNTER — Other Ambulatory Visit: Payer: Self-pay

## 2012-10-15 DIAGNOSIS — R0600 Dyspnea, unspecified: Secondary | ICD-10-CM

## 2012-10-15 DIAGNOSIS — I4891 Unspecified atrial fibrillation: Secondary | ICD-10-CM

## 2012-10-15 DIAGNOSIS — R0609 Other forms of dyspnea: Secondary | ICD-10-CM

## 2012-10-19 ENCOUNTER — Encounter: Payer: Self-pay | Admitting: Cardiovascular Disease

## 2012-11-07 ENCOUNTER — Encounter: Payer: Self-pay | Admitting: *Deleted

## 2012-11-11 ENCOUNTER — Ambulatory Visit: Payer: Medicare Other | Admitting: Cardiovascular Disease

## 2012-11-15 ENCOUNTER — Ambulatory Visit (INDEPENDENT_AMBULATORY_CARE_PROVIDER_SITE_OTHER): Payer: Medicare Other | Admitting: Cardiovascular Disease

## 2012-11-15 ENCOUNTER — Encounter: Payer: Self-pay | Admitting: Cardiovascular Disease

## 2012-11-15 VITALS — BP 102/56 | HR 63 | Ht 62.0 in | Wt 121.0 lb

## 2012-11-15 DIAGNOSIS — R0602 Shortness of breath: Secondary | ICD-10-CM

## 2012-11-15 DIAGNOSIS — I4891 Unspecified atrial fibrillation: Secondary | ICD-10-CM

## 2012-11-15 DIAGNOSIS — I5022 Chronic systolic (congestive) heart failure: Secondary | ICD-10-CM

## 2012-11-15 DIAGNOSIS — I2510 Atherosclerotic heart disease of native coronary artery without angina pectoris: Secondary | ICD-10-CM

## 2012-11-15 NOTE — Assessment & Plan Note (Signed)
She is doing very well and has no symptoms suggestive of recurrent angina. Continue medical therapy. I plan on treating her with dual antiplatelet therapy for one year. Brilinta will be stopped at the end of December of 2014.  She continues to attend cardiac rehabilitation and has been doing very well

## 2012-11-15 NOTE — Progress Notes (Signed)
HPI  This is a pleasant 78 year old female who is here today for followup visit. She presented on December 28 with chest pain of 2 days' duration. She was noted to have anterior ST elevation with Q waves. She was in A. fib with RVR and was in cardiogenic shock. I performed emergent cardiac catheterization which showed an occluded mid LAD and 95% stenosis in the proximal RCA. 2 drug-eluting stents were placed in the mid LAD and 1 drug-eluting stent to the proximal RCA. She was given IV amiodarone and subsequently converted to sinus rhythm after RCA PCI. Ejection fraction was 25-30% with akinesis of the mid distal anterior, apical and distal inferior wall. She had sluggish flow in the LAD post PCI likely due to late presentation. She did reasonably well. She had recurrent atrial fibrillation that required IV amiodarone again. She had mild fluid overload post hospital discharge and was started on furosemide.  She is feeling significantly better and denies any chest pain or significant dyspnea. She feels tired with overexertion but has been attending cardiac rehabilitation. She was taken off life vest after repeat echocardiogram showed significant improvement in LV systolic function with an ejection fraction of 35-40%.  Allergies  Allergen Reactions  . Ace Inhibitors   . Benadryl (Diphenhydramine Hcl)   . Codeine   . Hydrocodone      Current Outpatient Prescriptions on File Prior to Visit  Medication Sig Dispense Refill  . acetaminophen (TYLENOL) 325 MG tablet Take 650 mg by mouth every 6 (six) hours as needed for pain.      [name] amiodarone (PACERONE) 200 MG tablet Take 1 tablet (200 mg total) by mouth daily.  30 tablet  2  . aspirin 81 MG tablet Take 81 mg by mouth daily.      [name] atorvastatin (LIPITOR) 40 MG tablet Take 1 tablet (40 mg total) by mouth at bedtime.  30 tablet  6  . carvedilol (COREG) 3.125 MG tablet Take 1 tablet (3.125 mg total) by mouth 2 (two) times daily with a meal. Takes 1/2  tablet twice daily.  60 tablet  6  . fexofenadine (ALLEGRA) 180 MG tablet Take 180 mg by mouth daily as needed.      [name] LORazepam (ATIVAN) 0.5 MG tablet Take 0.5 mg by mouth every 8 (eight) hours.      [name] losartan (COZAAR) 25 MG tablet Take 1 tablet (25 mg total) by mouth daily.  30 tablet  6  . omeprazole (PRILOSEC) 40 MG capsule Take 40 mg by mouth 2 (two) times daily.      . Oxybutynin Chloride (DITROPAN PO) Take by mouth as needed.      [name] spironolactone (ALDACTONE) 25 MG tablet Take 1 tablet (25 mg total) by mouth daily.  30 tablet  6  . Ticagrelor (BRILINTA) 90 MG TABS tablet Take 1 tablet (90 mg total) by mouth 2 (two) times daily.  60 tablet  6   No current facility-administered medications on file prior to visit.     Past Medical History  Diagnosis Date  . Urinary incontinence   . Restless leg syndrome   . Non Hodgkin's lymphoma   . Chronic systolic heart failure     EF 40-98% due to ischemic cardiomyopathy with akinesis of mid distal anterior, apical and distal inferior wall  . Coronary artery disease 08/20/12    ST elevation myocardial infarction with late presentation and cardiogenic shock. Cardiac catheterization showed occluded mid LAD and 95% stenosis in proximal RCA. She  had 2 drug-eluting stent placements to the mid LAD and one drug-eluting stent placement to the proximal RCA. Ejection fraction was 25%.  . A-fib     A. fib with RVR in the setting of myocardial infarction. Converted to sinus rhythm with amiodarone.  . Hyperlipidemia   . Hypertension   . Atrophic vaginitis   . Insomnia   . Osteoarthritis   . Urinary, incontinence, stress female   . History of gastritis   . Migraine headache   . Fibrocystic breast disease   . Glaucoma   . GERD (gastroesophageal reflux disease)   . Polyposis of colon      Past Surgical History  Procedure Laterality Date  . Cardiac catheterization  08/19/2012    [name]; Jarek Longton  . Total abdominal hysterectomy w/ bilateral  salpingoophorectomy    . Coronary angioplasty with stent placement       Family History  Problem Relation Age of Onset  . Heart attack Father   . Heart disease Brother      History   Social History  . Marital Status: Divorced    Spouse Name: N/A    Number of Children: N/A  . Years of Education: N/A   Occupational History  . Not on file.   Social History Main Topics  . Smoking status: Never Smoker   . Smokeless tobacco: Not on file  . Alcohol Use: No  . Drug Use: No  . Sexually Active:    Other Topics Concern  . Not on file   Social History Narrative  . No narrative on file     PHYSICAL EXAM   BP 102/56  Pulse 63  Ht 5\' 2"  (1.575 m)  Wt 121 lb (54.885 kg)  BMI 22.13 kg/m2 Constitutional: She is oriented to person, place, and time. She appears well-developed and well-nourished. No distress.  HENT: No nasal discharge.  Head: Normocephalic and atraumatic.  Eyes: Pupils are equal and round. Right eye exhibits no discharge. Left eye exhibits no discharge.  Neck: Normal range of motion. Neck supple. No JVD present. No thyromegaly present.  Cardiovascular: Normal rate, regular rhythm, normal heart sounds. Exam reveals no gallop and no friction rub. No murmur heard.  Pulmonary/Chest: Effort normal and breath sounds normal. No stridor. No respiratory distress. She has no wheezes. She has no rales. She exhibits no tenderness.  Abdominal: Soft. Bowel sounds are normal. She exhibits no distension. There is no tenderness. There is no rebound and no guarding.  Musculoskeletal: Normal range of motion. She exhibits no edema and no tenderness.  Neurological: She is alert and oriented to person, place, and time. Coordination normal.  Skin: Skin is warm and dry. No rash noted. She is not diaphoretic. No erythema. No pallor.  Psychiatric: She has a normal mood and affect. Her behavior is normal. Judgment and thought content normal.    ZOX:WRUEA  Rhythm  -Poor R-wave  progression -may be secondary to pulmonary disease   consider old anterior infarct.   -  Nonspecific T-abnormality.  ABNORMAL   ASSESSMENT AND PLAN

## 2012-11-15 NOTE — Assessment & Plan Note (Signed)
She is currently New York Heart Association class II. Repeat echocardiogram showed improvement in her ejection fraction to 35-40%. She appears to be euvolemic. I asked her to use Lasix only as needed and not regularly. Continue other medications.

## 2012-11-15 NOTE — Patient Instructions (Addendum)
Take Lasix only as needed.  Continue other medications.  Follow up in 3 months.

## 2012-11-15 NOTE — Assessment & Plan Note (Signed)
This was only in the setting of acute myocardial infarction. She has been maintaining in sinus rhythm with amiodarone. I will plan to continue amiodarone for 12 months and likely discontinue the medication after that. We'll check liver and thyroid panel in the near future.

## 2012-11-19 ENCOUNTER — Encounter: Payer: Self-pay | Admitting: Cardiovascular Disease

## 2012-12-27 ENCOUNTER — Other Ambulatory Visit: Payer: Self-pay | Admitting: Cardiovascular Disease

## 2013-02-03 ENCOUNTER — Ambulatory Visit: Payer: Self-pay | Admitting: Oncology

## 2013-02-05 ENCOUNTER — Ambulatory Visit: Payer: Self-pay | Admitting: Oncology

## 2013-02-05 LAB — CBC CANCER CENTER
Basophil #: 0 x10 3/mm (ref 0.0–0.1)
Basophil %: 0.9 %
Eosinophil #: 0.1 x10 3/mm (ref 0.0–0.7)
Eosinophil %: 2.2 %
HGB: 12.5 g/dL (ref 12.0–16.0)
Lymphocyte %: 36.8 %
MCH: 34.5 pg — ABNORMAL HIGH (ref 26.0–34.0)
MCHC: 35 g/dL (ref 32.0–36.0)
MCV: 98 fL (ref 80–100)
Monocyte #: 0.3 x10 3/mm (ref 0.2–0.9)
Monocyte %: 6.3 %
Neutrophil #: 2.7 x10 3/mm (ref 1.4–6.5)
Neutrophil %: 53.8 %
RBC: 3.62 10*6/uL — ABNORMAL LOW (ref 3.80–5.20)

## 2013-02-14 ENCOUNTER — Encounter: Payer: Self-pay | Admitting: Cardiovascular Disease

## 2013-02-14 ENCOUNTER — Ambulatory Visit (INDEPENDENT_AMBULATORY_CARE_PROVIDER_SITE_OTHER): Payer: Medicare Other | Admitting: Cardiovascular Disease

## 2013-02-14 VITALS — BP 108/58 | HR 56 | Ht 62.0 in | Wt 117.5 lb

## 2013-02-14 DIAGNOSIS — R5383 Other fatigue: Secondary | ICD-10-CM

## 2013-02-14 DIAGNOSIS — I2510 Atherosclerotic heart disease of native coronary artery without angina pectoris: Secondary | ICD-10-CM

## 2013-02-14 DIAGNOSIS — I5022 Chronic systolic (congestive) heart failure: Secondary | ICD-10-CM

## 2013-02-14 DIAGNOSIS — R5381 Other malaise: Secondary | ICD-10-CM

## 2013-02-14 DIAGNOSIS — I4891 Unspecified atrial fibrillation: Secondary | ICD-10-CM

## 2013-02-14 MED ORDER — PANTOPRAZOLE SODIUM 40 MG PO TBEC: 40.0000 mg | DELAYED_RELEASE_TABLET | Freq: Two times a day (BID) | ORAL | Status: DC

## 2013-02-14 MED ORDER — AMIODARONE HCL 200 MG PO TABS: ORAL_TABLET | ORAL | Status: DC

## 2013-02-14 MED ORDER — CLOPIDOGREL BISULFATE 75 MG PO TABS: 75.0000 mg | ORAL_TABLET | Freq: Every day | ORAL | Status: DC

## 2013-02-14 NOTE — Patient Instructions (Addendum)
Stop Brilinta Stop Omeprazole  Start Plavix 75 mg once daily.  Start Protonix 40 mg twice daily.   Decrease Amiodarone to 1/2 tablet once daily   Follow up in 3 months.

## 2013-02-14 NOTE — Progress Notes (Signed)
HPI  This is a pleasant 78 year old female who is here today for followup visit. She presented on December 28 with chest pain of 2 days' duration. She was noted to have anterior ST elevation with Q waves. She was in A. fib with RVR and was in cardiogenic shock. I performed emergent cardiac catheterization which showed an occluded mid LAD and 95% stenosis in the proximal RCA. 2 drug-eluting stents were placed in the mid LAD and 1 drug-eluting stent to the proximal RCA. She was given IV amiodarone and subsequently converted to sinus rhythm after RCA PCI. Ejection fraction was 25-30% with akinesis of the mid distal anterior, apical and distal inferior wall. She had sluggish flow in the LAD post PCI likely due to late presentation. She did reasonably well.  She had mild fluid overload post hospital discharge and was started on furosemide.  She was taken off life vest after repeat echocardiogram showed significant improvement in LV systolic function with an ejection fraction of 35-40%. Overall, she has been doing reasonably well. She denies any chest discomfort. No significant dyspnea. She does have occasional palpitations. She complains of fatigue and no energy. She takes Lasix as needed now. She complains of extensive bruising in the upper and lower extremities.  Allergies  Allergen Reactions  . Ace Inhibitors   . Benadryl (Diphenhydramine Hcl)   . Codeine   . Hydrocodone      Current Outpatient Prescriptions on File Prior to Visit  Medication Sig Dispense Refill  . acetaminophen (TYLENOL) 325 MG tablet Take 650 mg by mouth every 6 (six) hours as needed for pain.      [name] amiodarone (PACERONE) 200 MG tablet TAKE 1 TABLET BY MOUTH EVERY DAY  30 tablet  5  . aspirin 81 MG tablet Take 81 mg by mouth daily.      [name] atorvastatin (LIPITOR) 40 MG tablet Take 1 tablet (40 mg total) by mouth at bedtime.  30 tablet  6  . carvedilol (COREG) 3.125 MG tablet Take 1 tablet (3.125 mg total) by mouth 2 (two)  times daily with a meal. Takes 1/2 tablet twice daily.  60 tablet  6  . fexofenadine (ALLEGRA) 180 MG tablet Take 180 mg by mouth daily as needed.      . furosemide (LASIX) 20 MG tablet Take 20 mg by mouth daily as needed.   30 tablet  3  . LORazepam (ATIVAN) 0.5 MG tablet Take 0.5 mg by mouth every 8 (eight) hours.      [name] losartan (COZAAR) 25 MG tablet Take 1 tablet (25 mg total) by mouth daily.  30 tablet  6  . omeprazole (PRILOSEC) 40 MG capsule Take 40 mg by mouth 2 (two) times daily.      . Oxybutynin Chloride (DITROPAN PO) Take by mouth as needed.      [name] spironolactone (ALDACTONE) 25 MG tablet Take 1 tablet (25 mg total) by mouth daily.  30 tablet  6  . Ticagrelor (BRILINTA) 90 MG TABS tablet Take 1 tablet (90 mg total) by mouth 2 (two) times daily.  60 tablet  6   No current facility-administered medications on file prior to visit.     Past Medical History  Diagnosis Date  . Urinary incontinence   . Restless leg syndrome   . Non Hodgkin's lymphoma   . Chronic systolic heart failure     EF 09-81% due to ischemic cardiomyopathy with akinesis of mid distal anterior, apical and distal inferior wall  .  Coronary artery disease 08/20/12    ST elevation myocardial infarction with late presentation and cardiogenic shock. Cardiac catheterization showed occluded mid LAD and 95% stenosis in proximal RCA. She had 2 drug-eluting stent placements to the mid LAD and one drug-eluting stent placement to the proximal RCA. Ejection fraction was 25%.  . A-fib     A. fib with RVR in the setting of myocardial infarction. Converted to sinus rhythm with amiodarone.  . Hyperlipidemia   . Hypertension   . Atrophic vaginitis   . Insomnia   . Osteoarthritis   . Urinary, incontinence, stress female   . History of gastritis   . Migraine headache   . Fibrocystic breast disease   . Glaucoma   . GERD (gastroesophageal reflux disease)   . Polyposis of colon      Past Surgical History  Procedure  Laterality Date  . Cardiac catheterization  08/19/2012    [name]; Devlin Mcveigh  . Total abdominal hysterectomy w/ bilateral salpingoophorectomy    . Coronary angioplasty with stent placement       Family History  Problem Relation Age of Onset  . Heart attack Father   . Heart disease Brother      History   Social History  . Marital Status: Divorced    Spouse Name: N/A    Number of Children: N/A  . Years of Education: N/A   Occupational History  . Not on file.   Social History Main Topics  . Smoking status: Never Smoker   . Smokeless tobacco: Not on file  . Alcohol Use: No  . Drug Use: No  . Sexually Active:    Other Topics Concern  . Not on file   Social History Narrative  . No narrative on file     PHYSICAL EXAM   BP 108/58  Pulse 56  Ht 5\' 2"  (1.575 m)  Wt 117 lb 8 oz (53.298 kg)  BMI 21.49 kg/m2 Constitutional: She is oriented to person, place, and time. She appears well-developed and well-nourished. No distress.  HENT: No nasal discharge.  Head: Normocephalic and atraumatic.  Eyes: Pupils are equal and round. Right eye exhibits no discharge. Left eye exhibits no discharge.  Neck: Normal range of motion. Neck supple. No JVD present. No thyromegaly present.  Cardiovascular: Normal rate, regular rhythm, normal heart sounds. Exam reveals no gallop and no friction rub. No murmur heard.  Pulmonary/Chest: Effort normal and breath sounds normal. No stridor. No respiratory distress. She has no wheezes. She has no rales. She exhibits no tenderness.  Abdominal: Soft. Bowel sounds are normal. She exhibits no distension. There is no tenderness. There is no rebound and no guarding.  Musculoskeletal: Normal range of motion. She exhibits no edema and no tenderness.  Neurological: She is alert and oriented to person, place, and time. Coordination normal.  Skin: Skin is warm and dry. Extensive bruising especially in the upper extremities. She is not diaphoretic. No erythema. No  pallor.  Psychiatric: She has a normal mood and affect. Her behavior is normal. Judgment and thought content normal.    ZOX:WRUEA  Bradycardia  -Left bundle branch block.   -Left atrial enlargement.   ABNORMAL    ASSESSMENT AND PLAN

## 2013-02-17 ENCOUNTER — Encounter: Payer: Self-pay | Admitting: Cardiovascular Disease

## 2013-02-17 NOTE — Assessment & Plan Note (Signed)
Overall she seems to be stable with no convincing symptoms of angina. Continue medical therapy. She is having extensive bruising. Given that her myocardial infarction was 6 months ago, I will switch Brilinta to Plavix 75 mg once daily. Continue aspirin 81 mg once daily. I also switched omeprazole to Protonix due to interaction with Plavix.

## 2013-02-17 NOTE — Assessment & Plan Note (Signed)
She continues to be in normal sinus rhythm with mildly bradycardic. She has not had recurrent documented episodes since her myocardial infarction. I will decrease the dose of amiodarone 100 mg once daily. She will need a followup liver and thyroid profile during next visit.

## 2013-02-17 NOTE — Assessment & Plan Note (Signed)
She appears to be euvolemic and uses Lasix as needed. She is on spironolactone. I will obtain routine labs during her 3 months followup visit. Most recent ejection fraction was 35-40%. However, given the progression of her EKG changes to complete left bundle branch block, I will plan on repeating her echocardiogram in 3 months to see if there is an indication for ICD/CRT. Continue other heart failure medications. The dosages cannot be increased at this time due to relatively no blood pressure. I might consider adding digoxin at some point if she continues to complain of fatigue. This has to be balanced against the risk of bradycardia while she is on amiodarone.

## 2013-02-18 ENCOUNTER — Ambulatory Visit: Payer: Self-pay | Admitting: Oncology

## 2013-03-03 ENCOUNTER — Telehealth: Payer: Self-pay | Admitting: *Deleted

## 2013-03-03 NOTE — Telephone Encounter (Signed)
Pt aware of Dr. Odessa Fleming recommendations. Pt will come to the [facility] office on Wednesday 03/05/13 at 9:30 AM for BMET and CBC blood work and nurse visit for BP check, scheduled by Bishop Dublin CMA. Pt aware.

## 2013-03-03 NOTE — Telephone Encounter (Signed)
Per verbal order from Dr Colin Mulders Crespo--STOP Spironolactone, Check CBC and BMP and recheck BP on Wednesday.

## 2013-03-03 NOTE — Telephone Encounter (Signed)
Dr. Kirke Corin changed her medication to Protonics 75mg   And she is feeling lightheaded

## 2013-03-03 NOTE — Telephone Encounter (Signed)
Left pt a message to call back. 

## 2013-03-03 NOTE — Telephone Encounter (Signed)
Will forward to dr. Kirke Corin

## 2013-03-03 NOTE — Telephone Encounter (Signed)
Patient call c/o of light headed, she thinks that new medication ordered Protonix  40 mg twice a day is making her being lightheaded. Pt states that yesterday at noon when she felt lightheaded took her BP and  the Systolic pressure was in the 80's, today a noon again had the same symptoms, her BP is 84/46. Pt takes Carvedilol 3.125 mg twice a day, Losartan 25 mg once a day, Spironolactone 25 mg once a day, Amiodarone 100 mg once a day; Pt take all this medications in the morning.

## 2013-03-04 ENCOUNTER — Other Ambulatory Visit: Payer: Medicare Other

## 2013-03-05 ENCOUNTER — Ambulatory Visit (INDEPENDENT_AMBULATORY_CARE_PROVIDER_SITE_OTHER): Payer: Medicare Other

## 2013-03-05 ENCOUNTER — Other Ambulatory Visit: Payer: Self-pay

## 2013-03-05 VITALS — BP 104/58 | HR 58 | Ht 62.0 in | Wt 118.0 lb

## 2013-03-05 DIAGNOSIS — I4891 Unspecified atrial fibrillation: Secondary | ICD-10-CM

## 2013-03-05 DIAGNOSIS — I5022 Chronic systolic (congestive) heart failure: Secondary | ICD-10-CM

## 2013-03-05 DIAGNOSIS — I2510 Atherosclerotic heart disease of native coronary artery without angina pectoris: Secondary | ICD-10-CM

## 2013-03-05 DIAGNOSIS — Z79899 Other long term (current) drug therapy: Secondary | ICD-10-CM

## 2013-03-05 DIAGNOSIS — R42 Dizziness and giddiness: Secondary | ICD-10-CM

## 2013-03-05 NOTE — Addendum Note (Signed)
Addended by: Migdalia Dk on: 03/05/2013 02:23 PM   Modules accepted: Orders

## 2013-03-05 NOTE — Progress Notes (Addendum)
Continue to hold Spironolactone. Decrease Losartan to 12.5 mg once daily. I might add Digoxin once I review her labs. ----- Message ----- From: Luan Moore, RN Sent: 03/05/2013 10:01 AM To: Iran Ouch, MD  Called pt advised of Dr Jari Sportsman recommendations.  Pt verbalized understanding.  Advised I will call back after lab results received with any further recommendations.

## 2013-03-05 NOTE — Progress Notes (Signed)
Pt of Dr Jari Sportsman seen in office today for weakness, lightheadedness and dizziness onset 02/27/13.  Pt called office on 7/14 see telephone message with c/o dizziness.  Dr Graciela Husbands advised pt to stop Spironolactone which she d/c on 03/04/13 and to come into office for CBC and BMP and BP check on 03/05/13.  Pt came into office for BP check today BP 104/58 and HR 58 and regular.  Pt also brought in a log of BP's from home 7/11 BP in am 84/42, 7/14 BP 1:30p 84/46, 4p 89/44, 9:30p 97/43, 7/15 7:30a 105/51 before meds, 9a 100/44 takes meds, 1:30p 88/41, 7:30p 114/53, 7/16 7:30a 108/54, 9a 98/48.  Pt states she feels "ok" first thing in the am, but becomes lightheaded and dizzy after taking morning medications.  Pt is currently taking Amiodarone 100mg  daily, Coreg 3.125 BID, Losartan 25mg  QD, Furosemide 20mg  prn and is currently holding Spironolactone. Advised pt I would have Dr Kirke Corin review and advise of any medication changes or recommendations.

## 2013-03-06 LAB — CBC WITH DIFFERENTIAL/PLATELET
Basophils Absolute: 0 10*3/uL (ref 0.0–0.2)
Eosinophils Absolute: 0.1 10*3/uL (ref 0.0–0.4)
HCT: 36.8 % (ref 34.0–46.6)
Immature Granulocytes: 0 % (ref 0–2)
Lymphocytes Absolute: 1.8 10*3/uL (ref 0.7–3.1)
MCH: 33.2 pg — ABNORMAL HIGH (ref 26.6–33.0)
MCHC: 34 g/dL (ref 31.5–35.7)
MCV: 98 fL — ABNORMAL HIGH (ref 79–97)
Monocytes Absolute: 0.2 10*3/uL (ref 0.1–0.9)
RDW: 14.2 % (ref 12.3–15.4)

## 2013-03-06 LAB — BASIC METABOLIC PANEL
BUN/Creatinine Ratio: 21 (ref 11–26)
CO2: 20 mmol/L (ref 18–29)
Chloride: 101 mmol/L (ref 97–108)
GFR calc Af Amer: 57 mL/min/{1.73_m2} — ABNORMAL LOW (ref >59)
Sodium: 138 mmol/L (ref 134–144)

## 2013-03-12 ENCOUNTER — Other Ambulatory Visit: Payer: Self-pay | Admitting: Cardiovascular Disease

## 2013-03-12 ENCOUNTER — Telehealth: Payer: Self-pay

## 2013-03-12 NOTE — Telephone Encounter (Signed)
Request from [facility] for colon/EGD clearance for procedure scheduled for 04/10/13 with propofol anesthesia.  Per Dr Kirke Corin pt needs an office visit for evaluation.  Attempted to contact pt and schedule. N/A.

## 2013-03-19 NOTE — Telephone Encounter (Signed)
LMTCB

## 2013-03-19 NOTE — Telephone Encounter (Signed)
Spoke with pt advised Dr Kirke Corin wants to see pt for colonoscopy clearance.  Scheduled appt for 04/07/13 at 11am.  Pt aware, pt states she is going to call [facility] to r/s procedure to later date d/t conflict in son's schedule.

## 2013-04-07 ENCOUNTER — Ambulatory Visit (INDEPENDENT_AMBULATORY_CARE_PROVIDER_SITE_OTHER): Payer: Medicare Other | Admitting: Cardiovascular Disease

## 2013-04-07 ENCOUNTER — Encounter: Payer: Self-pay | Admitting: Cardiovascular Disease

## 2013-04-07 VITALS — BP 137/94 | HR 59 | Ht 62.0 in | Wt 118.8 lb

## 2013-04-07 DIAGNOSIS — I2510 Atherosclerotic heart disease of native coronary artery without angina pectoris: Secondary | ICD-10-CM

## 2013-04-07 DIAGNOSIS — R0609 Other forms of dyspnea: Secondary | ICD-10-CM

## 2013-04-07 DIAGNOSIS — R0789 Other chest pain: Secondary | ICD-10-CM

## 2013-04-07 DIAGNOSIS — R0600 Dyspnea, unspecified: Secondary | ICD-10-CM

## 2013-04-07 DIAGNOSIS — R42 Dizziness and giddiness: Secondary | ICD-10-CM

## 2013-04-07 DIAGNOSIS — E785 Hyperlipidemia, unspecified: Secondary | ICD-10-CM | POA: Insufficient documentation

## 2013-04-07 DIAGNOSIS — Z01810 Encounter for preprocedural cardiovascular examination: Secondary | ICD-10-CM

## 2013-04-07 DIAGNOSIS — I5022 Chronic systolic (congestive) heart failure: Secondary | ICD-10-CM

## 2013-04-07 DIAGNOSIS — I4891 Unspecified atrial fibrillation: Secondary | ICD-10-CM

## 2013-04-07 NOTE — Assessment & Plan Note (Signed)
The patient is in need of surveillance EGD/colonoscopy with propofol. Given her myocardial infarction in December of 2013 which required 2 drug-eluting stent placements, I favor no discontinuation of dual antiplatelet therapy until December of 2014 unless these procedures are urgent.  She is at an overall moderate risk for cardiovascular complications. If these procedures are routine, I recommend postponing until December of this year.

## 2013-04-07 NOTE — Assessment & Plan Note (Signed)
Continue small dose amiodarone 100 mg once daily. Check thyroid and liver profile today.

## 2013-04-07 NOTE — Progress Notes (Signed)
HPI  This is a pleasant 78 year old female who is here today for followup visit. She presented on August 17, 2012 with chest pain of 2 days' duration. She was noted to have anterior ST elevation with Q waves. She was in A. fib with RVR and was in cardiogenic shock. I performed emergent cardiac catheterization which showed an occluded mid LAD and 95% stenosis in the proximal RCA. 2 drug-eluting stents were placed in the mid LAD and 1 drug-eluting stent to the proximal RCA. Ejection fraction was 25-30% with akinesis of the mid distal anterior, apical and distal inferior wall. She had sluggish flow in the LAD post PCI likely due to late presentation. She did reasonably well.  She had mild fluid overload post hospital discharge and was started on furosemide.  She was taken off life vest after repeat echocardiogram showed significant improvement in LV systolic function with an ejection fraction of 35-40%. Recently, she had symptomatic hypotension which required discontinuation of spironolactone in decreasing the dose of losartan to 12.5 mg once daily. Overall, she is feeling better and has been monitoring her blood pressure at home with excellent readings. She has known history of non-Hodgkin's lymphoma. She also has previous gastric and colon polyps which requires surveillance with an EGD/colonoscopy which is due now.   Allergies  Allergen Reactions  . Ace Inhibitors   . Benadryl [Diphenhydramine Hcl]   . Codeine   . Hydrocodone      Current Outpatient Prescriptions on File Prior to Visit  Medication Sig Dispense Refill  . acetaminophen (TYLENOL) 325 MG tablet Take 650 mg by mouth every 6 (six) hours as needed for pain.      [name] amiodarone (PACERONE) 200 MG tablet TAKE 1/2 TABLET BY MOUTH EVERY DAY  30 tablet  5  . aspirin 81 MG tablet Take 81 mg by mouth daily.      [name] atorvastatin (LIPITOR) 40 MG tablet Take 1 tablet (40 mg total) by mouth at bedtime.  30 tablet  6  . clopidogrel (PLAVIX) 75  MG tablet Take 1 tablet (75 mg total) by mouth daily.  30 tablet  6  . fexofenadine (ALLEGRA) 180 MG tablet Take 180 mg by mouth daily as needed.      . furosemide (LASIX) 20 MG tablet Take 20 mg by mouth daily as needed.   30 tablet  3  . LORazepam (ATIVAN) 0.5 MG tablet Take 0.5 mg by mouth every 8 (eight) hours.      [name] losartan (COZAAR) 25 MG tablet Take 12.5 mg by mouth daily.      . Oxybutynin Chloride (DITROPAN PO) Take by mouth as needed.      . pantoprazole (PROTONIX) 40 MG tablet Take 1 tablet (40 mg total) by mouth 2 (two) times daily.  60 tablet  6   No current facility-administered medications on file prior to visit.     Past Medical History  Diagnosis Date  . Urinary incontinence   . Restless leg syndrome   . Non Hodgkin's lymphoma   . Chronic systolic heart failure     EF 16-10% due to ischemic cardiomyopathy with akinesis of mid distal anterior, apical and distal inferior wall  . Coronary artery disease 08/20/12    ST elevation myocardial infarction with late presentation and cardiogenic shock. Cardiac catheterization showed occluded mid LAD and 95% stenosis in proximal RCA. She had 2 drug-eluting stent placements to the mid LAD and one drug-eluting stent placement to the proximal RCA. Ejection fraction  was 25%.  . A-fib     A. fib with RVR in the setting of myocardial infarction. Converted to sinus rhythm with amiodarone.  . Hyperlipidemia   . Hypertension   . Atrophic vaginitis   . Insomnia   . Osteoarthritis   . Urinary, incontinence, stress female   . History of gastritis   . Migraine headache   . Fibrocystic breast disease   . Glaucoma   . GERD (gastroesophageal reflux disease)   . Polyposis of colon      Past Surgical History  Procedure Laterality Date  . Cardiac catheterization  08/19/2012    [name]; [facility]  . Total abdominal hysterectomy w/ bilateral salpingoophorectomy    . Coronary angioplasty with stent placement       Family History  Problem  Relation Age of Onset  . Heart attack Father   . Heart disease Brother      History   Social History  . Marital Status: Divorced    Spouse Name: N/A    Number of Children: N/A  . Years of Education: N/A   Occupational History  . Not on file.   Social History Main Topics  . Smoking status: Never Smoker   . Smokeless tobacco: Not on file  . Alcohol Use: No  . Drug Use: No  . Sexual Activity:    Other Topics Concern  . Not on file   Social History Narrative  . No narrative on file     PHYSICAL EXAM   BP 137/94  Pulse 59  Ht 5\' 2"  (1.575 m)  Wt 118 lb 12 oz (53.865 kg)  BMI 21.71 kg/m2 Constitutional: She is oriented to person, place, and time. She appears well-developed and well-nourished. No distress.  HENT: No nasal discharge.  Head: Normocephalic and atraumatic.  Eyes: Pupils are equal and round. Right eye exhibits no discharge. Left eye exhibits no discharge.  Neck: Normal range of motion. Neck supple. No JVD present. No thyromegaly present.  Cardiovascular: Normal rate, regular rhythm, normal heart sounds. Exam reveals no gallop and no friction rub. No murmur heard.  Pulmonary/Chest: Effort normal and breath sounds normal. No stridor. No respiratory distress. She has no wheezes. She has no rales. She exhibits no tenderness.  Abdominal: Soft. Bowel sounds are normal. She exhibits no distension. There is no tenderness. There is no rebound and no guarding.  Musculoskeletal: Normal range of motion. She exhibits no edema and no tenderness.  Neurological: She is alert and oriented to person, place, and time. Coordination normal.  Skin: Skin is warm and dry. Extensive bruising especially in the upper extremities. She is not diaphoretic. No erythema. No pallor.  Psychiatric: She has a normal mood and affect. Her behavior is normal. Judgment and thought content normal.    AVW:[id number]  Bradycardia  -Left bundle branch block.   -Left atrial enlargement.   ABNORMAL     ASSESSMENT AND PLAN

## 2013-04-07 NOTE — Assessment & Plan Note (Signed)
I will check liver and lipid profile today. Continue treatment with atorvastatin.

## 2013-04-07 NOTE — Patient Instructions (Addendum)
Labs today.   Continue same medications.   Keep appointment for echo in September.   Follow up in 3 months.

## 2013-04-07 NOTE — Assessment & Plan Note (Signed)
Most recent ejection fraction was 35-40%. However, she has progressed to complete left bundle branch block which might indicate negative remodeling. Due to that, I will obtain an echocardiogram next month to reevaluate his ejection fraction. Continue small dose carvedilol and losartan. She uses Lasix as needed usually twice weekly.

## 2013-04-07 NOTE — Assessment & Plan Note (Signed)
No clear symptoms of angina. Continue medical therapy.

## 2013-04-08 LAB — BASIC METABOLIC PANEL
BUN: 22 mg/dL (ref 8–27)
CO2: 21 mmol/L (ref 18–29)
Calcium: 9.4 mg/dL (ref 8.6–10.2)
Creatinine, Ser: 0.98 mg/dL (ref 0.57–1.00)
GFR calc non Af Amer: 57 mL/min/{1.73_m2} — ABNORMAL LOW (ref >59)

## 2013-04-08 LAB — LIPID PANEL
Cholesterol, Total: 136 mg/dL (ref 100–199)
HDL: 54 mg/dL (ref >39)
LDL Calculated: 58 mg/dL (ref 0–99)
Triglycerides: 120 mg/dL (ref 0–149)
VLDL Cholesterol Cal: 24 mg/dL (ref 5–40)

## 2013-04-08 LAB — TSH: TSH: 6.31 u[IU]/mL — ABNORMAL HIGH (ref 0.450–4.500)

## 2013-04-08 LAB — HEPATIC FUNCTION PANEL: Total Protein: 5.8 g/dL — ABNORMAL LOW (ref 6.0–8.5)

## 2013-04-09 ENCOUNTER — Telehealth: Payer: Self-pay

## 2013-04-09 NOTE — Telephone Encounter (Signed)
Pt aware of results.  She will keep her appt for an ECHO on 9/25.  Inquired about her upcoming colonoscopy and if we had "talked to Dr. Mechele Collin".  Per Dr. Jari Sportsman note, he would prefer that she wait until December for her GI procedures.

## 2013-04-09 NOTE — Telephone Encounter (Signed)
Message copied by Marilynne Halsted on Wed Apr 09, 2013  9:35 AM ------      Message from: Lorine Bears A      Created: Tue Apr 08, 2013  5:58 PM       Inform patient that labs were normal except for mildly elevated TSH which could be due to treatment with Amiodarone. I recommend repeat TSH, free T3 and T4 in 1 month. Cholesterol was excellent.       I am forwarding these results to Dr. Judithann Sheen as well. ------

## 2013-04-17 NOTE — Telephone Encounter (Signed)
The patient called back today regarding if Dr. Mechele Collin and Dr. Kirke Corin had spoken regarding her procedure, or if Dr. Markham Jordan had been in touch with our office. I explained to the patient that according to her chart, there is no record of this. I spoke with Angelica Chessman, RN and she states there has been no further communication between our office and Dr. Markham Jordan. I have left a message on the patient's home voice mail per her request with this information.

## 2013-04-23 ENCOUNTER — Other Ambulatory Visit: Payer: Self-pay | Admitting: Cardiovascular Disease

## 2013-05-02 ENCOUNTER — Other Ambulatory Visit (INDEPENDENT_AMBULATORY_CARE_PROVIDER_SITE_OTHER): Payer: Medicare Other

## 2013-05-02 ENCOUNTER — Other Ambulatory Visit: Payer: Self-pay

## 2013-05-02 DIAGNOSIS — R0609 Other forms of dyspnea: Secondary | ICD-10-CM

## 2013-05-02 DIAGNOSIS — R0789 Other chest pain: Secondary | ICD-10-CM

## 2013-05-02 DIAGNOSIS — R0600 Dyspnea, unspecified: Secondary | ICD-10-CM

## 2013-05-06 ENCOUNTER — Telehealth: Payer: Self-pay

## 2013-05-06 NOTE — Telephone Encounter (Signed)
Spoke w/ pt.  She is aware of results.  

## 2013-05-06 NOTE — Telephone Encounter (Signed)
Message copied by Marilynne Halsted on Tue May 06, 2013  5:09 PM ------      Message from: Lorine Bears A      Created: Tue May 06, 2013  8:06 AM       Inform patient that echo was fine. EF improved to 45-50%. Continue same medications. ------

## 2013-05-14 ENCOUNTER — Other Ambulatory Visit: Payer: Self-pay | Admitting: Cardiovascular Disease

## 2013-05-15 ENCOUNTER — Other Ambulatory Visit: Payer: Medicare Other

## 2013-05-16 ENCOUNTER — Ambulatory Visit: Payer: Medicare Other | Admitting: Cardiovascular Disease

## 2013-06-27 ENCOUNTER — Ambulatory Visit: Payer: Self-pay | Admitting: Internal Medicine

## 2013-07-24 ENCOUNTER — Encounter: Payer: Self-pay | Admitting: *Deleted

## 2013-07-25 ENCOUNTER — Encounter: Payer: Self-pay | Admitting: Cardiovascular Disease

## 2013-07-25 ENCOUNTER — Ambulatory Visit (INDEPENDENT_AMBULATORY_CARE_PROVIDER_SITE_OTHER): Payer: Medicare Other | Admitting: Cardiovascular Disease

## 2013-07-25 VITALS — BP 134/71 | HR 56 | Ht 59.0 in | Wt 119.5 lb

## 2013-07-25 DIAGNOSIS — I4891 Unspecified atrial fibrillation: Secondary | ICD-10-CM

## 2013-07-25 DIAGNOSIS — I2510 Atherosclerotic heart disease of native coronary artery without angina pectoris: Secondary | ICD-10-CM

## 2013-07-25 DIAGNOSIS — I5022 Chronic systolic (congestive) heart failure: Secondary | ICD-10-CM

## 2013-07-25 NOTE — Progress Notes (Signed)
Primary care physician: Dr. Judithann Sheen  HPI  This is a pleasant 78 year old female who is here today for followup visit. She presented on August 17, 2012 with chest pain of 2 days' duration. She was noted to have anterior ST elevation with Q waves. She was in A. fib with RVR and was in cardiogenic shock. I performed emergent cardiac catheterization which showed an occluded mid LAD and 95% stenosis in the proximal RCA. 2 drug-eluting stents were placed in the mid LAD and 1 drug-eluting stent to the proximal RCA. Ejection fraction was 25-30% with akinesis of the mid distal anterior, apical and distal inferior wall. She had sluggish flow in the LAD post PCI likely due to late presentation. Most recent ejection fraction was 45-50% in September. She has known history of non-Hodgkin's lymphoma. She also has previous gastric and colon polyps which requires surveillance with an EGD/colonoscopy which is due now.  She has been doing well from a cardiac standpoint he denies any chest pain, dyspnea or palpitations.   Allergies  Allergen Reactions  . Ace Inhibitors   . Benadryl [Diphenhydramine Hcl]   . Codeine   . Hydrocodone      Current Outpatient Prescriptions on File Prior to Visit  Medication Sig Dispense Refill  . acetaminophen (TYLENOL) 325 MG tablet Take 650 mg by mouth every 6 (six) hours as needed for pain.      [name] amiodarone (PACERONE) 200 MG tablet TAKE 1/2 TABLET BY MOUTH EVERY DAY  30 tablet  5  . aspirin 81 MG tablet Take 81 mg by mouth daily.      [name] atorvastatin (LIPITOR) 40 MG tablet TAKE 1 TABLET BY MOUTH AT BEDTIME  30 tablet  6  . carvedilol (COREG) 3.125 MG tablet TAKE 1 TABLET BY MOUTH TWICE A DAY WITH FOOD  60 tablet  6  . clopidogrel (PLAVIX) 75 MG tablet Take 1 tablet (75 mg total) by mouth daily.  30 tablet  6  . fexofenadine (ALLEGRA) 180 MG tablet Take 180 mg by mouth daily as needed.      . furosemide (LASIX) 20 MG tablet Take 20 mg by mouth daily as needed.   30 tablet  3    . LORazepam (ATIVAN) 0.5 MG tablet Take 0.5 mg by mouth every 8 (eight) hours.      [name] losartan (COZAAR) 25 MG tablet Take 12.5 mg by mouth daily.      . Oxybutynin Chloride (DITROPAN PO) Take by mouth as needed.      . pantoprazole (PROTONIX) 40 MG tablet Take 1 tablet (40 mg total) by mouth 2 (two) times daily.  60 tablet  6  . pregabalin (LYRICA) 50 MG capsule Take 50 mg by mouth as needed.       No current facility-administered medications on file prior to visit.     Past Medical History  Diagnosis Date  . Urinary incontinence   . Restless leg syndrome   . Non Hodgkin's lymphoma   . Chronic systolic heart failure     EF 95-62% due to ischemic cardiomyopathy with akinesis of mid distal anterior, apical and distal inferior wall  . Coronary artery disease 08/20/12    ST elevation myocardial infarction with late presentation and cardiogenic shock. Cardiac catheterization showed occluded mid LAD and 95% stenosis in proximal RCA. She had 2 drug-eluting stent placements to the mid LAD and one drug-eluting stent placement to the proximal RCA. Ejection fraction was 25%.  . A-fib     A. fib  with RVR in the setting of myocardial infarction. Converted to sinus rhythm with amiodarone.  . Hyperlipidemia   . Hypertension   . Atrophic vaginitis   . Insomnia   . Osteoarthritis   . Urinary, incontinence, stress female   . History of gastritis   . Migraine headache   . Fibrocystic breast disease   . Glaucoma   . GERD (gastroesophageal reflux disease)   . Polyposis of colon      Past Surgical History  Procedure Laterality Date  . Cardiac catheterization  08/19/2012    [name]; [facility]  . Total abdominal hysterectomy w/ bilateral salpingoophorectomy    . Coronary angioplasty with stent placement       Family History  Problem Relation Age of Onset  . Heart attack Father   . Heart disease Brother      History   Social History  . Marital Status: Divorced    Spouse Name: N/A    Number  of Children: N/A  . Years of Education: N/A   Occupational History  . Not on file.   Social History Main Topics  . Smoking status: Never Smoker   . Smokeless tobacco: Not on file  . Alcohol Use: No  . Drug Use: No  . Sexual Activity:    Other Topics Concern  . Not on file   Social History Narrative  . No narrative on file     PHYSICAL EXAM   BP 134/71  Pulse 56  Ht 4\' 11"  (1.499 m)  Wt 119 lb 8 oz (54.205 kg)  BMI 24.12 kg/m2 Constitutional: She is oriented to person, place, and time. She appears well-developed and well-nourished. No distress.  HENT: No nasal discharge.  Head: Normocephalic and atraumatic.  Eyes: Pupils are equal and round. Right eye exhibits no discharge. Left eye exhibits no discharge.  Neck: Normal range of motion. Neck supple. No JVD present. No thyromegaly present.  Cardiovascular: Normal rate, regular rhythm, normal heart sounds. Exam reveals no gallop and no friction rub. No murmur heard.  Pulmonary/Chest: Effort normal and breath sounds normal. No stridor. No respiratory distress. She has no wheezes. She has no rales. She exhibits no tenderness.  Abdominal: Soft. Bowel sounds are normal. She exhibits no distension. There is no tenderness. There is no rebound and no guarding.  Musculoskeletal: Normal range of motion. She exhibits no edema and no tenderness.  Neurological: She is alert and oriented to person, place, and time. Coordination normal.  Skin: Skin is warm and dry. Extensive bruising especially in the upper extremities. She is not diaphoretic. No erythema. No pallor.  Psychiatric: She has a normal mood and affect. Her behavior is normal. Judgment and thought content normal.    WUJ:[id number]  Bradycardia  Nonspecific ST and T wave changes  ABNORMAL    ASSESSMENT AND PLAN

## 2013-07-25 NOTE — Patient Instructions (Signed)
Continue same medications.  Stop Plavix 7 days before colonoscopy and resume after the procedure after you check with Dr. Mechele Collin.   Your physician wants you to follow-up in: 6 months.  You will receive a reminder letter in the mail two months in advance. If you don't receive a letter, please call our office to schedule the follow-up appointment.

## 2013-07-26 ENCOUNTER — Encounter: Payer: Self-pay | Admitting: Cardiovascular Disease

## 2013-07-26 NOTE — Assessment & Plan Note (Signed)
Most recent ejection fraction improved to 45-50%. She appears to be euvolemic. She takes Lasix as needed. Continue same medications.       

## 2013-07-26 NOTE — Assessment & Plan Note (Signed)
She is doing very well with no recurrent angina. She is due for colonoscopy. Plavix can be stopped 7 days before the procedure after January 1. I want her to continue on dual antiplatelet therapy long-term. Plavix should be resumed after the procedure it is safe from a bleeding standpoint.

## 2013-07-26 NOTE — Assessment & Plan Note (Signed)
She is maintaining in sinus rhythm on small dose amiodarone. Your TSH was slightly abnormal. I will plan on repeat this in about 6 months. She has no symptoms of thyroid disease. I will likely take her off amiodarone next year given that the A-fib happened in the setting of acute myocardial infarction and cardiogenic shock.

## 2013-07-27 ENCOUNTER — Other Ambulatory Visit: Payer: Self-pay | Admitting: Cardiovascular Disease

## 2013-07-28 ENCOUNTER — Other Ambulatory Visit: Payer: Self-pay | Admitting: *Deleted

## 2013-07-28 MED ORDER — LOSARTAN POTASSIUM 25 MG PO TABS: 12.5000 mg | ORAL_TABLET | Freq: Every day | ORAL | Status: DC

## 2013-07-28 NOTE — Telephone Encounter (Signed)
Requested Prescriptions   Signed Prescriptions Disp Refills  . losartan (COZAAR) 25 MG tablet 30 tablet 3    Sig: Take 0.5 tablets (12.5 mg total) by mouth daily.    Authorizing Provider: Lorine Bears A    Ordering User: Kendrick Fries

## 2013-08-06 ENCOUNTER — Ambulatory Visit: Payer: Self-pay | Admitting: Oncology

## 2013-08-06 LAB — LACTATE DEHYDROGENASE: LDH: 147 U/L (ref 81–246)

## 2013-08-06 LAB — CBC CANCER CENTER
Basophil #: 0.1 x10 3/mm (ref 0.0–0.1)
Basophil %: 1.1 %
Eosinophil %: 2.4 %
HCT: 37.2 % (ref 35.0–47.0)
HGB: 12.5 g/dL (ref 12.0–16.0)
Lymphocyte #: 2.3 x10 3/mm (ref 1.0–3.6)
Lymphocyte %: 37.3 %
MCH: 32.7 pg (ref 26.0–34.0)
MCHC: 33.6 g/dL (ref 32.0–36.0)
MCV: 97 fL (ref 80–100)
Monocyte #: 0.6 x10 3/mm (ref 0.2–0.9)
Monocyte %: 9.4 %
Neutrophil %: 49.8 %
WBC: 6 x10 3/mm (ref 3.6–11.0)

## 2013-08-06 LAB — COMPREHENSIVE METABOLIC PANEL
Alkaline Phosphatase: 74 U/L
Anion Gap: 8 (ref 7–16)
BUN: 20 mg/dL — ABNORMAL HIGH (ref 7–18)
Calcium, Total: 8.7 mg/dL (ref 8.5–10.1)
Co2: 29 mmol/L (ref 21–32)
Creatinine: 1.04 mg/dL (ref 0.60–1.30)
EGFR (African American): >60
EGFR (Non-African Amer.): 52 — ABNORMAL LOW
Osmolality: 287 (ref 275–301)
Potassium: 3.4 mmol/L — ABNORMAL LOW (ref 3.5–5.1)
SGOT(AST): 14 U/L — ABNORMAL LOW (ref 15–37)
Sodium: 142 mmol/L (ref 136–145)
Total Protein: 6.2 g/dL — ABNORMAL LOW (ref 6.4–8.2)

## 2013-08-21 ENCOUNTER — Ambulatory Visit: Payer: Self-pay | Admitting: Oncology

## 2013-09-16 ENCOUNTER — Other Ambulatory Visit: Payer: Self-pay | Admitting: Cardiovascular Disease

## 2013-11-03 ENCOUNTER — Telehealth: Payer: Self-pay | Admitting: *Deleted

## 2013-11-03 NOTE — Telephone Encounter (Signed)
Faxed sx clearance form to [facility]

## 2013-11-07 ENCOUNTER — Telehealth: Payer: Self-pay | Admitting: *Deleted

## 2013-11-07 NOTE — Telephone Encounter (Signed)
Colonoscopy clearance faxed second time

## 2013-11-23 ENCOUNTER — Other Ambulatory Visit: Payer: Self-pay | Admitting: Cardiovascular Disease

## 2013-12-12 ENCOUNTER — Other Ambulatory Visit: Payer: Self-pay | Admitting: Cardiovascular Disease

## 2013-12-21 ENCOUNTER — Other Ambulatory Visit: Payer: Self-pay | Admitting: Cardiovascular Disease

## 2013-12-26 ENCOUNTER — Ambulatory Visit: Payer: Self-pay | Admitting: Unknown Physician Specialty

## 2013-12-29 LAB — PATHOLOGY REPORT

## 2013-12-30 DIAGNOSIS — E785 Hyperlipidemia, unspecified: Secondary | ICD-10-CM | POA: Insufficient documentation

## 2013-12-30 DIAGNOSIS — M810 Age-related osteoporosis without current pathological fracture: Secondary | ICD-10-CM | POA: Insufficient documentation

## 2013-12-30 DIAGNOSIS — I4891 Unspecified atrial fibrillation: Secondary | ICD-10-CM | POA: Insufficient documentation

## 2013-12-30 DIAGNOSIS — E039 Hypothyroidism, unspecified: Secondary | ICD-10-CM | POA: Insufficient documentation

## 2013-12-30 DIAGNOSIS — E559 Vitamin D deficiency, unspecified: Secondary | ICD-10-CM | POA: Insufficient documentation

## 2013-12-30 DIAGNOSIS — I10 Essential (primary) hypertension: Secondary | ICD-10-CM | POA: Insufficient documentation

## 2013-12-30 DIAGNOSIS — I7090 Unspecified atherosclerosis: Secondary | ICD-10-CM | POA: Insufficient documentation

## 2014-02-04 ENCOUNTER — Ambulatory Visit: Payer: Self-pay | Admitting: Oncology

## 2014-02-06 ENCOUNTER — Ambulatory Visit: Payer: Medicare Other | Admitting: Cardiovascular Disease

## 2014-02-08 ENCOUNTER — Other Ambulatory Visit: Payer: Self-pay | Admitting: Cardiovascular Disease

## 2014-02-09 ENCOUNTER — Ambulatory Visit: Payer: Self-pay | Admitting: Oncology

## 2014-02-09 LAB — CBC CANCER CENTER
BASOS ABS: 0.1 x10 3/mm (ref 0.0–0.1)
Basophil %: 1.1 %
Eosinophil #: 0.2 x10 3/mm (ref 0.0–0.7)
Eosinophil %: 2.4 %
HCT: 43.2 % (ref 35.0–47.0)
HGB: 14.6 g/dL (ref 12.0–16.0)
Lymphocyte #: 2.2 x10 3/mm (ref 1.0–3.6)
Lymphocyte %: 34.4 %
MCH: 33.3 pg (ref 26.0–34.0)
MCHC: 33.7 g/dL (ref 32.0–36.0)
MCV: 99 fL (ref 80–100)
MONOS PCT: 7 %
Monocyte #: 0.5 x10 3/mm (ref 0.2–0.9)
Neutrophil #: 3.6 x10 3/mm (ref 1.4–6.5)
Neutrophil %: 55.1 %
PLATELETS: 151 x10 3/mm (ref 150–440)
RBC: 4.37 10*6/uL (ref 3.80–5.20)
RDW: 13.7 % (ref 11.5–14.5)
WBC: 6.5 x10 3/mm (ref 3.6–11.0)

## 2014-02-09 LAB — COMPREHENSIVE METABOLIC PANEL
ALBUMIN: 4.1 g/dL (ref 3.4–5.0)
ANION GAP: 8 (ref 7–16)
Alkaline Phosphatase: 75 U/L
BUN: 20 mg/dL — ABNORMAL HIGH (ref 7–18)
Bilirubin,Total: 0.9 mg/dL (ref 0.2–1.0)
CALCIUM: 9.3 mg/dL (ref 8.5–10.1)
CHLORIDE: 102 mmol/L (ref 98–107)
Co2: 30 mmol/L (ref 21–32)
Creatinine: 1.19 mg/dL (ref 0.60–1.30)
EGFR (Non-African Amer.): 44 — ABNORMAL LOW
GFR CALC AF AMER: 51 — AB
Glucose: 122 mg/dL — ABNORMAL HIGH (ref 65–99)
OSMOLALITY: 283 (ref 275–301)
POTASSIUM: 4.3 mmol/L (ref 3.5–5.1)
SGOT(AST): 13 U/L — ABNORMAL LOW (ref 15–37)
SGPT (ALT): 23 U/L (ref 12–78)
SODIUM: 140 mmol/L (ref 136–145)
Total Protein: 7 g/dL (ref 6.4–8.2)

## 2014-02-16 ENCOUNTER — Ambulatory Visit (INDEPENDENT_AMBULATORY_CARE_PROVIDER_SITE_OTHER): Payer: Medicare Other | Admitting: Cardiovascular Disease

## 2014-02-16 ENCOUNTER — Encounter: Payer: Self-pay | Admitting: Cardiovascular Disease

## 2014-02-16 VITALS — BP 110/60 | HR 55 | Ht 61.0 in | Wt 113.2 lb

## 2014-02-16 DIAGNOSIS — I2510 Atherosclerotic heart disease of native coronary artery without angina pectoris: Secondary | ICD-10-CM

## 2014-02-16 DIAGNOSIS — R079 Chest pain, unspecified: Secondary | ICD-10-CM

## 2014-02-16 DIAGNOSIS — I5022 Chronic systolic (congestive) heart failure: Secondary | ICD-10-CM

## 2014-02-16 DIAGNOSIS — I4891 Unspecified atrial fibrillation: Secondary | ICD-10-CM

## 2014-02-16 DIAGNOSIS — I209 Angina pectoris, unspecified: Secondary | ICD-10-CM

## 2014-02-16 DIAGNOSIS — E785 Hyperlipidemia, unspecified: Secondary | ICD-10-CM

## 2014-02-16 DIAGNOSIS — I480 Paroxysmal atrial fibrillation: Secondary | ICD-10-CM

## 2014-02-16 DIAGNOSIS — I25119 Atherosclerotic heart disease of native coronary artery with unspecified angina pectoris: Secondary | ICD-10-CM

## 2014-02-16 NOTE — Patient Instructions (Signed)
Stop Amiodarone.   Continue other medications.   Your physician wants you to follow-up in: 6 months.  You will receive a reminder letter in the mail two months in advance. If you don't receive a letter, please call our office to schedule the follow-up appointment.

## 2014-02-16 NOTE — Assessment & Plan Note (Signed)
She is maintaining in sinus rhythm on small dose amiodarone. A-fib happened in the setting of acute myocardial infarction and cardiogenic shock. Thus, I do think it's worth taking her off amiodarone with watchful waiting especially with the side effect profile of amiodarone. If she develops future atrial fibrillation, she might require resumption of antiarrhythmic medication and also consideration for long-term anticoagulation.

## 2014-02-16 NOTE — Assessment & Plan Note (Signed)
She is doing very well with no symptoms suggestive of angina. Continue medical therapy. I might consider stopping Plavix December of this year given that her myocardial infarction and stenting was in December of 2013.

## 2014-02-16 NOTE — Assessment & Plan Note (Signed)
Lab Results  Component Value Date   HDL 54 04/07/2013   LDLCALC 58 04/07/2013   TRIG 120 04/07/2013   CHOLHDL 2.5 04/07/2013   Continue treatment with atorvastatin. She will require a fasting lipid and liver profile in 6 months if not done by PCP.

## 2014-02-16 NOTE — Assessment & Plan Note (Signed)
Most recent ejection fraction improved to 45-50%. She appears to be euvolemic. She takes Lasix as needed. Continue same medications.       

## 2014-02-16 NOTE — Progress Notes (Signed)
Primary care physician: Dr. Doy Hutching  HPI  [name] who is here today for followup visit. She presented on August 17, 2012 with chest pain of 2 days' duration. She was noted to have anterior ST elevation with Q waves. She was in A. fib with RVR and was in cardiogenic shock. I performed emergent cardiac catheterization which showed an occluded mid LAD and 95% stenosis in the proximal RCA. 2 drug-eluting stents were placed in the mid LAD and 1 drug-eluting stent to the proximal RCA. Ejection fraction was 25-30% with akinesis of the mid distal anterior, apical and distal inferior wall. She had sluggish flow in the LAD post PCI likely due to late presentation. Most recent ejection fraction was 45-50% in September, 2014. She has known history of non-Hodgkin's lymphoma. She also has previous gastric and colon polyps which requires surveillance with an EGD/colonoscopy .  She had colonoscopy done in may with removal of one polyp. She has been doing well from a cardiac standpoint he denies any chest pain, dyspnea or palpitations.   Allergies  Allergen Reactions  . Ace Inhibitors   . Benadryl [Diphenhydramine Hcl]   . Codeine   . Hydrocodone      Current Outpatient Prescriptions on File Prior to Visit  Medication Sig Dispense Refill  . amiodarone (PACERONE) 200 MG tablet TAKE 1/2 TABLET BY MOUTH EVERY DAY  30 tablet  5  . aspirin 81 MG tablet Take 81 mg by mouth daily.      [name] atorvastatin (LIPITOR) 40 MG tablet TAKE 1 TABLET BY MOUTH AT BEDTIME  30 tablet  6  . carvedilol (COREG) 3.125 MG tablet TAKE 1 TABLET BY MOUTH TWICE A DAY WITH FOOD  60 tablet  3  . Cholecalciferol (VITAMIN D-3) 5000 UNITS TABS Take by mouth daily.      . clopidogrel (PLAVIX) 75 MG tablet TAKE 1 TABLET BY MOUTH DAILY  30 tablet  3  . fexofenadine (ALLEGRA) 180 MG tablet Take 180 mg by mouth daily as needed.      . furosemide (LASIX) 20 MG tablet Take 20 mg by mouth daily as needed.   30 tablet  3   . LORazepam (ATIVAN) 0.5 MG tablet Take 0.5 mg by mouth every 8 (eight) hours.      [name] losartan (COZAAR) 25 MG tablet Take 0.5 tablets (12.5 mg total) by mouth daily.  30 tablet  3  . Oxybutynin Chloride (DITROPAN PO) Take by mouth as needed.      . pantoprazole (PROTONIX) 40 MG tablet TAKE 1 TABLET BY MOUTH TWICE A DAY  60 tablet  6  . pregabalin (LYRICA) 50 MG capsule Take 50 mg by mouth as needed.       No current facility-administered medications on file prior to visit.     Past Medical History  Diagnosis Date  . Urinary incontinence   . Restless leg syndrome   . Non Hodgkin's lymphoma   . Chronic systolic heart failure     EF was 25-35% post MI but improved to 45-50% in 04/2013  . Coronary artery disease 08/20/12    ST elevation myocardial infarction with late presentation and cardiogenic shock. Cardiac catheterization showed occluded mid LAD and 95% stenosis in proximal RCA. She had 2 drug-eluting stent placements to the mid LAD and one drug-eluting stent placement to the proximal RCA. Ejection fraction was 25%.  . A-fib     A. fib with RVR in the setting of myocardial infarction. Converted  to sinus rhythm with amiodarone.  . Hyperlipidemia   . Hypertension   . Atrophic vaginitis   . Insomnia   . Osteoarthritis   . Urinary, incontinence, stress female   . History of gastritis   . Migraine headache   . Fibrocystic breast disease   . Glaucoma   . GERD (gastroesophageal reflux disease)   . Polyposis of colon      Past Surgical History  Procedure Laterality Date  . Cardiac catheterization  08/19/2012    [name]; [facility]  . Total abdominal hysterectomy w/ bilateral salpingoophorectomy    . Coronary angioplasty with stent placement    . Colonoscopy       Family History  Problem Relation Age of Onset  . Heart attack Father   . Heart disease Brother      History   Social History  . Marital Status: Divorced    Spouse Name: N/A    Number of Children: N/A  . Years of  Education: N/A   Occupational History  . Not on file.   Social History Main Topics  . Smoking status: Never Smoker   . Smokeless tobacco: Not on file  . Alcohol Use: No  . Drug Use: No  . Sexual Activity: Not on file   Other Topics Concern  . Not on file   Social History Narrative  . No narrative on file     PHYSICAL EXAM   BP 110/60  Pulse 55  Ht 5\' 1"  (1.549 m)  Wt 113 lb 4 oz (51.37 kg)  BMI 21.41 kg/m2 Constitutional: She is oriented to person, place, and time. She appears well-developed and well-nourished. No distress.  HENT: No nasal discharge.  Head: Normocephalic and atraumatic.  Eyes: Pupils are equal and round. Right eye exhibits no discharge. Left eye exhibits no discharge.  Neck: Normal range of motion. Neck supple. No JVD present. No thyromegaly present.  Cardiovascular: Normal rate, regular rhythm, normal heart sounds. Exam reveals no gallop and no friction rub. No murmur heard.  Pulmonary/Chest: Effort normal and breath sounds normal. No stridor. No respiratory distress. She has no wheezes. She has no rales. She exhibits no tenderness.  Abdominal: Soft. Bowel sounds are normal. She exhibits no distension. There is no tenderness. There is no rebound and no guarding.  Musculoskeletal: Normal range of motion. She exhibits no edema and no tenderness.  Neurological: She is alert and oriented to person, place, and time. Coordination normal.  Skin: Skin is warm and dry. Extensive bruising especially in the upper extremities. She is not diaphoretic. No erythema. No pallor.  Psychiatric: She has a normal mood and affect. Her behavior is normal. Judgment and thought content normal.    EKG: Sinus  Bradycardia  -Left axis -anterior fascicular block.   -Anterior infarct -age undetermined.   ABNORMAL    ASSESSMENT AND PLAN

## 2014-02-18 ENCOUNTER — Ambulatory Visit: Payer: Self-pay | Admitting: Oncology

## 2014-04-23 ENCOUNTER — Other Ambulatory Visit: Payer: Self-pay | Admitting: Cardiovascular Disease

## 2014-05-06 ENCOUNTER — Other Ambulatory Visit: Payer: Self-pay | Admitting: Cardiovascular Disease

## 2014-05-29 ENCOUNTER — Other Ambulatory Visit: Payer: Self-pay | Admitting: *Deleted

## 2014-05-29 MED ORDER — FUROSEMIDE 20 MG PO TABS: 20.0000 mg | ORAL_TABLET | Freq: Every day | ORAL | Status: DC | PRN

## 2014-06-10 ENCOUNTER — Other Ambulatory Visit: Payer: Self-pay | Admitting: *Deleted

## 2014-06-10 MED ORDER — CLOPIDOGREL BISULFATE 75 MG PO TABS: ORAL_TABLET | ORAL | Status: DC

## 2014-07-24 ENCOUNTER — Ambulatory Visit: Payer: Medicare Other | Admitting: Cardiovascular Disease

## 2014-07-31 ENCOUNTER — Ambulatory Visit (INDEPENDENT_AMBULATORY_CARE_PROVIDER_SITE_OTHER): Payer: Medicare Other | Admitting: Cardiovascular Disease

## 2014-07-31 ENCOUNTER — Encounter: Payer: Self-pay | Admitting: Cardiovascular Disease

## 2014-07-31 VITALS — BP 100/60 | HR 61 | Ht 62.0 in | Wt 116.2 lb

## 2014-07-31 DIAGNOSIS — I5022 Chronic systolic (congestive) heart failure: Secondary | ICD-10-CM

## 2014-07-31 DIAGNOSIS — E785 Hyperlipidemia, unspecified: Secondary | ICD-10-CM

## 2014-07-31 DIAGNOSIS — R0602 Shortness of breath: Secondary | ICD-10-CM

## 2014-07-31 DIAGNOSIS — I2510 Atherosclerotic heart disease of native coronary artery without angina pectoris: Secondary | ICD-10-CM

## 2014-07-31 DIAGNOSIS — I480 Paroxysmal atrial fibrillation: Secondary | ICD-10-CM

## 2014-07-31 NOTE — Assessment & Plan Note (Signed)
Continue treatment with atorvastatin with a target LDL of less than 70. 

## 2014-07-31 NOTE — Patient Instructions (Signed)
Continue same medications.   Your physician wants you to follow-up in: 6 months.  You will receive a reminder letter in the mail two months in advance. If you don't receive a letter, please call our office to schedule the follow-up appointment.  

## 2014-07-31 NOTE — Assessment & Plan Note (Signed)
She is maintaining in sinus rhythm off amiodarone. A-fib happened in the setting of acute myocardial infarction and cardiogenic shock. If she develops future atrial fibrillation, she might require resumption of antiarrhythmic medication and also consideration for long-term anticoagulation.

## 2014-07-31 NOTE — Assessment & Plan Note (Signed)
Most recent ejection fraction improved to 45-50%. She appears to be euvolemic. She takes Lasix as needed. Continue same medications.       

## 2014-07-31 NOTE — Progress Notes (Signed)
Primary care physician: Dr. Doy Hutching  HPI  [name] who is here today for followup visit. She presented on August 17, 2012 with chest pain of 2 days' duration. She was noted to have anterior ST elevation with Q waves. She was in A. fib with RVR and was in cardiogenic shock. I performed emergent cardiac catheterization which showed an occluded mid LAD and 95% stenosis in the proximal RCA. 2 drug-eluting stents were placed in the mid LAD and 1 drug-eluting stent to the proximal RCA. Ejection fraction was 25-30% with akinesis of the mid distal anterior, apical and distal inferior wall. She had sluggish flow in the LAD post PCI likely due to late presentation. Most recent ejection fraction was 45-50% in September, 2014. She has known history of non-Hodgkin's lymphoma.  She has been doing well from a cardiac standpoint he denies any chest pain or palpitations. I stopped amiodarone during last visit with no evidence of recurrent arrhythmia.   Allergies  Allergen Reactions  . Ace Inhibitors   . Benadryl [Diphenhydramine Hcl]   . Codeine   . Hydrocodone      Current Outpatient Prescriptions on File Prior to Visit  Medication Sig Dispense Refill  . acetaminophen (TYLENOL) 650 MG CR tablet Take 650 mg by mouth every 8 (eight) hours as needed for pain.    [name] aspirin 81 MG tablet Take 81 mg by mouth daily.    [name] atorvastatin (LIPITOR) 40 MG tablet TAKE 1 TABLET BY MOUTH AT BEDTIME 30 tablet 6  . carvedilol (COREG) 3.125 MG tablet TAKE 1 TABLET BY MOUTH TWICE A DAY WITH FOOD 60 tablet 3  . Cholecalciferol (VITAMIN D-3) 5000 UNITS TABS Take by mouth daily.    . clopidogrel (PLAVIX) 75 MG tablet TAKE 1 TABLET BY MOUTH DAILY 30 tablet 3  . fexofenadine (ALLEGRA) 180 MG tablet Take 180 mg by mouth daily as needed.    . furosemide (LASIX) 20 MG tablet Take 1 tablet (20 mg total) by mouth daily as needed. 30 tablet 3  . LORazepam (ATIVAN) 0.5 MG tablet Take 0.5 mg by mouth every  8 (eight) hours.    [name] losartan (COZAAR) 25 MG tablet Take 0.5 tablets (12.5 mg total) by mouth daily. 30 tablet 3  . Oxybutynin Chloride (DITROPAN PO) Take by mouth as needed.    . pantoprazole (PROTONIX) 40 MG tablet TAKE 1 TABLET BY MOUTH TWICE A DAY 60 tablet 6  . pregabalin (LYRICA) 50 MG capsule Take 50 mg by mouth as needed.     No current facility-administered medications on file prior to visit.     Past Medical History  Diagnosis Date  . Urinary incontinence   . Restless leg syndrome   . Non Hodgkin's lymphoma   . Chronic systolic heart failure     EF was 25-35% post MI but improved to 45-50% in 04/2013  . Coronary artery disease 08/20/12    ST elevation myocardial infarction with late presentation and cardiogenic shock. Cardiac catheterization showed occluded mid LAD and 95% stenosis in proximal RCA. She had 2 drug-eluting stent placements to the mid LAD and one drug-eluting stent placement to the proximal RCA. Ejection fraction was 25%.  . A-fib     A. fib with RVR in the setting of myocardial infarction. Converted to sinus rhythm with amiodarone.  . Hyperlipidemia   . Hypertension   . Atrophic vaginitis   . Insomnia   . Osteoarthritis   . Urinary, incontinence, stress female   .  History of gastritis   . Migraine headache   . Fibrocystic breast disease   . Glaucoma   . GERD (gastroesophageal reflux disease)   . Polyposis of colon   . Glaucoma      Past Surgical History  Procedure Laterality Date  . Cardiac catheterization  08/19/2012    [name]; [facility]  . Total abdominal hysterectomy w/ bilateral salpingoophorectomy    . Coronary angioplasty with stent placement    . Colonoscopy       Family History  Problem Relation Age of Onset  . Heart attack Father   . Heart disease Brother      History   Social History  . Marital Status: Divorced    Spouse Name: N/A    Number of Children: N/A  . Years of Education: N/A   Occupational History  . Not on file.    Social History Main Topics  . Smoking status: Never Smoker   . Smokeless tobacco: Not on file  . Alcohol Use: No  . Drug Use: No  . Sexual Activity: Not on file   Other Topics Concern  . Not on file   Social History Narrative     PHYSICAL EXAM   BP 100/60 mmHg  Pulse 61  Ht 5\' 2"  (1.575 m)  Wt 116 lb 4 oz (52.731 kg)  BMI 21.26 kg/m2 Constitutional: She is oriented to person, place, and time. She appears well-developed and well-nourished. No distress.  HENT: No nasal discharge.  Head: Normocephalic and atraumatic.  Eyes: Pupils are equal and round. Right eye exhibits no discharge. Left eye exhibits no discharge.  Neck: Normal range of motion. Neck supple. No JVD present. No thyromegaly present.  Cardiovascular: Normal rate, regular rhythm, normal heart sounds. Exam reveals no gallop and no friction rub. No murmur heard.  Pulmonary/Chest: Effort normal and breath sounds normal. No stridor. No respiratory distress. She has no wheezes. She has no rales. She exhibits no tenderness.  Abdominal: Soft. Bowel sounds are normal. She exhibits no distension. There is no tenderness. There is no rebound and no guarding.  Musculoskeletal: Normal range of motion. She exhibits no edema and no tenderness.  Neurological: She is alert and oriented to person, place, and time. Coordination normal.  Skin: Skin is warm and dry. Extensive bruising especially in the upper extremities. She is not diaphoretic. No erythema. No pallor.  Psychiatric: She has a normal mood and affect. Her behavior is normal. Judgment and thought content normal.    EKG: Sinus  Rhythm  -Left axis -anterior fascicular block.   -Anterior infarct -age undetermined.   ABNORMAL    ASSESSMENT AND PLAN

## 2014-07-31 NOTE — Assessment & Plan Note (Signed)
She is doing very well with no symptoms suggestive of angina. Continue medical therapy. Plavix is optional at the present time. However, given that she has to overlapped drug-eluting stents in the LAD and one drug-eluting stent in the RCA, I elected to continue dual antiplatelet therapy.

## 2014-08-03 ENCOUNTER — Other Ambulatory Visit: Payer: Self-pay | Admitting: Cardiovascular Disease

## 2014-08-30 ENCOUNTER — Other Ambulatory Visit: Payer: Self-pay | Admitting: Cardiovascular Disease

## 2014-09-04 ENCOUNTER — Ambulatory Visit: Payer: Self-pay | Admitting: Oncology

## 2014-09-07 LAB — CBC CANCER CENTER
Basophil #: 0.1 x10 3/mm (ref 0.0–0.1)
Basophil %: 1.1 %
EOS PCT: 2.9 %
Eosinophil #: 0.2 x10 3/mm (ref 0.0–0.7)
HCT: 40 % (ref 35.0–47.0)
HGB: 13.6 g/dL (ref 12.0–16.0)
Lymphocyte #: 2.5 x10 3/mm (ref 1.0–3.6)
Lymphocyte %: 41 %
MCH: 32.2 pg (ref 26.0–34.0)
MCHC: 33.9 g/dL (ref 32.0–36.0)
MCV: 95 fL (ref 80–100)
MONOS PCT: 7.2 %
Monocyte #: 0.4 x10 3/mm (ref 0.2–0.9)
NEUTROS ABS: 2.9 x10 3/mm (ref 1.4–6.5)
Neutrophil %: 47.8 %
Platelet: 147 x10 3/mm — ABNORMAL LOW (ref 150–440)
RBC: 4.22 10*6/uL (ref 3.80–5.20)
RDW: 13.7 % (ref 11.5–14.5)
WBC: 6.1 x10 3/mm (ref 3.6–11.0)

## 2014-09-07 LAB — COMPREHENSIVE METABOLIC PANEL
ALBUMIN: 3.8 g/dL (ref 3.4–5.0)
ANION GAP: 6 — AB (ref 7–16)
Alkaline Phosphatase: 75 U/L
BUN: 19 mg/dL — AB (ref 7–18)
Bilirubin,Total: 0.8 mg/dL (ref 0.2–1.0)
Calcium, Total: 8.8 mg/dL (ref 8.5–10.1)
Chloride: 106 mmol/L (ref 98–107)
Co2: 29 mmol/L (ref 21–32)
Creatinine: 1.01 mg/dL (ref 0.60–1.30)
EGFR (African American): >60
EGFR (Non-African Amer.): 56 — ABNORMAL LOW
Glucose: 109 mg/dL — ABNORMAL HIGH (ref 65–99)
Osmolality: 284 (ref 275–301)
POTASSIUM: 4 mmol/L (ref 3.5–5.1)
SGOT(AST): 11 U/L — ABNORMAL LOW (ref 15–37)
SGPT (ALT): 22 U/L
Sodium: 141 mmol/L (ref 136–145)
TOTAL PROTEIN: 6.5 g/dL (ref 6.4–8.2)

## 2014-09-07 LAB — LACTATE DEHYDROGENASE: LDH: 146 U/L (ref 81–246)

## 2014-09-16 ENCOUNTER — Other Ambulatory Visit: Payer: Self-pay | Admitting: Cardiovascular Disease

## 2014-09-21 ENCOUNTER — Ambulatory Visit: Payer: Self-pay | Admitting: Oncology

## 2014-09-22 ENCOUNTER — Ambulatory Visit: Payer: Self-pay | Admitting: Internal Medicine

## 2014-10-04 ENCOUNTER — Other Ambulatory Visit: Payer: Self-pay | Admitting: Cardiovascular Disease

## 2014-12-04 ENCOUNTER — Other Ambulatory Visit: Payer: Self-pay | Admitting: Oncology

## 2014-12-04 DIAGNOSIS — C8298 Follicular lymphoma, unspecified, lymph nodes of multiple sites: Secondary | ICD-10-CM

## 2014-12-11 NOTE — Discharge Summary (Signed)
PATIENT NAME:  Jeanne Pittman, Jeanne Pittman MR#:  [id number] DATE OF BIRTH:  1935/04/01  DATE OF ADMISSION:  08/19/2012 DATE OF DISCHARGE:  08/23/2012  PRIMARY CARE PHYSICIAN: Fulton Reek, MD    DISCHARGE DIAGNOSES:  1. Anterior ST elevation myocardial infarction with late presentation.  2. Cardiogenic shock.  3. New systolic heart failure with an ejection fraction of 25 to 35% due to anterior myocardial infarction.  4. Atrial fibrillation with rapid ventricular response.  5. History of non-Hodgkin's lymphoma.   HISTORY OF PRESENT ILLNESS AND HOSPITAL COURSE: This is a 80 year old female with no previous cardiac history. She presented with chest pain that started 48 hours before she sought medical attention. In the Emergency Room, she was noted to be in atrial fibrillation with rapid ventricular response with a heart rate of 170. She was hypotensive. She was given 15 mg of IV diltiazem. ECG showed improvement in tachycardia, but it showed anterior ST elevation with extensive Q waves. The patient was taken to the cardiac catheterization lab emergently where cardiac catheterization was performed. It showed an occluded mid LAD with 95% stenosis in the proximal RCA. She underwent an angioplasty and 2 drug-eluting stent placements to the mid LAD with restoration of TIMI 2 flow. There was poor flow in the LAD likely due to hypotension and late presentation. Due to continued hemodynamic instability and shock requiring Levophed, I decided to intervene in the right coronary artery, which was not the culprit for ST elevation myocardial infarction. I placed a drug-eluting stent in the proximal segment with quick improvement in hemodynamics after that. She was given IV amiodarone which helped convert her to normal sinus rhythm which, in turn, improved her hypotension. She was taken off Levophed. The patient was observed in the Intensive Care Unit and was continued on amiodarone drip. She was treated with Brilinta and  aspirin. She was transferred to telemetry and had recurrent atrial fibrillation with rapid ventricular response. She was started again on IV amiodarone and converted quickly to normal sinus rhythm. Echocardiogram showed an ejection fraction of 25 to 35% with no evidence of left ventricular or apical thrombus. The patient was able to walk in the hallway with physical therapy without any chest pain or desaturation. She will be discharged home in stable condition. Outpatient cardiac rehab will be recommended. She is to follow up within 5 to 7 days.  MEDICATIONS:  1. Allegra 180 mg once daily.  2. Omeprazole 40 mg daily.  3. Lorazepam 0.5 mg at bedtime as needed.  4. Aspirin 81 mg once daily.  5. Amiodarone 200 mg twice daily.  6. Atorvastatin 40 mg at bedtime.  7. Carvedilol 3.125 mg twice daily.  8. Lisinopril 2.5 mg once daily.  9. Spironolactone 25 mg once daily. 10. Brilinta 90 mg twice daily.   I elected not to place the patient on warfarin due to the need for dual antiplatelet therapy and borderline thrombocytopenia. If she develops recurrent atrial fibrillation or evidence of left ventricular thrombus, I will consider switching her from Brilinta to warfarin and likely adding Plavix instead.   ____________________________ Mertie Clause. Fletcher Anon, MD maa:cb D: 08/23/2012 12:37:00 ET T: 08/23/2012 13:40:08 ET JOB#: [id number]  cc: Rogue Jury A. Fletcher Anon, MD, <Dictator> Leonie Douglas. Doy Hutching, MD Rogue Jury Ferne Reus MD ELECTRONICALLY SIGNED 09/10/2012 17:40

## 2014-12-11 NOTE — H&P (Signed)
PATIENT NAME:  Jeanne Pittman, Jeanne Pittman MR#:  [id number] DATE OF BIRTH:  1935/04/21  DATE OF ADMISSION:  08/19/2012  PRIMARY CARE PHYSICIAN:  Fulton Reek, MD and Forest Gleason, MD from oncology.   CHIEF COMPLAINT: Chest pain and weakness.   HISTORY OF PRESENT ILLNESS: This is a pleasant 80 year old female with history of non-Hodgkin's lymphoma which has been treated who presented to the Emergency Room with chest pain. The chest pain actually started on Saturday and described as substernal heaviness and tightness radiating to her shoulder.  It has been severe in nature and intermittent since then. The patient thought that she was having a flu and did not seek medical attention. However, today she felt significantly worse with increased shortness of breath, chest pain and extreme fatigue and dizziness. She came to the Emergency Room and was noted to be in atrial fibrillation with rapid ventricular response with a heart rate of 170 beats per minute on presentation. She was noted to have a new left bundle branch block. She was given 15 mg of IV diltiazem which improved the tachycardia. It was evident in the subsequent EKG that there was significant ST elevation in the anterior leads with Q waves. Thus a code STEMI was called. I evaluated the patient at the bedside in the Emergency Room. She was in significant distress and discomfort. She was tachycardic and hypotensive. I gave her 4000 units of heparin as well as aspirin and 2.5 mg of IV Lopressor to improve her tachycardia before taking her to the Cath Lab. An emergent consent was obtained. The patient was taken to the cardiac cath lab for emergent angiography and possible angioplasty.   PAST MEDICAL HISTORY: 1.  Non-Hodgkin's lymphoma.  2.  Urinary incontinence.  3.  Restless leg syndrome.   The patient has no previous history of hypertension, hyperlipidemia or diabetes.   HOME MEDICATIONS: 1.  Allegra 180 mg once daily.  2.  Omeprazole 40 mg once daily.   3.  Lyrica 50 mg at bedtime.  4.  Oxybutynin 10 mg daily.  5.  Lorazepam 0.5 mg as needed.   ALLERGIES: 1.  BENADRYL. 2.  CODEINE.   3.  QUAIFENESIN. 4.  HYDROCODONE. 5.  ACE INHIBITOR WHICH CAUSED COUGH.  SOCIAL HISTORY: Negative for smoking, alcohol or recreational drug use.   FAMILY HISTORY: Negative for premature coronary artery disease.   REVIEW OF SYSTEMS: A 10 point review of systems was performed. It is negative other than what is mentioned in the history of present illness.   PHYSICAL EXAMINATION:  VITAL SIGNS: Blood pressure was ranging anywhere from 60 systolic to 010 with a diastolic pressure of 40 to 50. Heart rate was also ranging from 110 to 160 due to atrial fibrillation with rapid ventricular response. She was mildly tachypneic and afebrile.  Oxygen saturation 98% on 2 liters nasal cannula.  GENERAL: The patient was in significant distress and uncomfortable due to 4 pain. She appeared to be at her stated age.  HEENT: Normocephalic, atraumatic.  NECK: No JVD or carotid bruits.  RESPIRATORY: Normal respiratory effort with no use of accessory muscles. Auscultation reveals normal breath sounds.  CARDIOVASCULAR: Normal PMI. She was tachycardic with irregular  rhythm. No gallops or murmurs are appreciated.  ABDOMEN: Benign, nontender and nondistended.  EXTREMITIES: No clubbing, cyanosis or edema.  SKIN: Warm and dry with no rash.  PSYCHIATRIC: She is alert, oriented x 3 with normal mood and affect.   LABORATORY AND DIAGNOSTIC DATA:  Her ECG showed atrial fibrillation  with rapid ventricular response. There was left bundle branch block on initial EKG when she was tachycardic. Subsequently, she was noted to have anterior ST elevation with Q waves.   IMPRESSION: 1.  Anterior ST elevation myocardial infarction.  2.  Cardiogenic shock.  3.  Atrial fibrillation with rapid ventricular response.  4.  History of non-Hodgkin's lymphoma.   RECOMMENDATIONS: The patient was taken  to the cardiac catheterization lab emergently. She was in critical condition and unstable with severe tachycardia and hypotension. Her hemodynamics improved after giving her IV metoprolol.   Cardiac catheterization was done which showed an occluded mid LAD, as well as a 95% hazy stenosis in the proximal RCA suggestive of another plaque rupture. She underwent emergent angioplasty and 2 drug-eluting stent placements to the mid LAD, which established TIMI-II flow. I also decided to intervene in the right coronary artery given her significant hemodynamic instability. A drug-eluting stent was placed to the proximal RCA. The patient improved significantly after intervening on the right coronary artery and I was able to wean her off Levophed which was started for significant hypotension. I also decided to start her on IV amiodarone bolus followed by drip to try to maintain sinus rhythm. After PCI of the RCA, she stayed in sinus rhythm and I elected to keep her on IV amiodarone. The patient was still in critical condition.  Left ventricular angiography showed an ejection fraction of 25% with akinesis of the mid distal anterior wall, apex and distal inferior wall. Unfortunately, she presented late with anterior myocardial infarction. The onset of symptoms was more than 48 hours before presentation. For now, we will continue dual antiplatelet therapy. I also elected to start her on Integrilin for 12 hours due to poor flow in the stented LAD. We will attempt to start a small dose carvedilol and ACE inhibitor subsequently if her blood pressure allows. Continue amiodarone for atrial fibrillation. The patient will continue to be in critical condition for up to 48 hours.    ____________________________ Mertie Clause Fletcher Anon, MD maa:ct D: 08/20/2012 08:02:00 ET T: 08/20/2012 08:25:25 ET JOB#: [id number]  cc: Rogue Jury A. Fletcher Anon, MD, <Dictator> Leonie Douglas. Doy Hutching, MD Martie Lee. Oliva Bustard, MD Rogue Jury Ferne Reus MD ELECTRONICALLY SIGNED  09/10/2012 17:39

## 2014-12-21 ENCOUNTER — Telehealth: Payer: Self-pay | Admitting: *Deleted

## 2014-12-21 NOTE — Telephone Encounter (Signed)
Spoke w/ pt.  She reports that her BP generally runs 120/62, HR has been in the 90s. Pt states that she has not felt good for a few weeks now, has no energy and cannot get out of the bed.  She is adamant that her sx are r/t Plavix.  She states that Dr. Fletcher Anon had mentioned potentially stopping this at her next ov, but she states that is too long to wait.  Pt attempted to contact her PCP regarding her sx, but states that she was on hold for over 30 mins and did not get an appt. She would like for me to make Dr. Fletcher Anon aware and "see if he thinks I'm overmedicated".

## 2014-12-21 NOTE — Telephone Encounter (Signed)
I doubt that the fatigue is related to Plavix. Her heart rate is usually in 50-60s so being in 90s is unusual for her. She might be in A-fib. Bring her to see Thurmond Butts or at least an ECG.

## 2014-12-21 NOTE — Telephone Encounter (Signed)
Pt states she is a bit weak, falling and stumbling a lot.  Heart rate was a bit high, does not remember bp reading much. Would like someone to call her and see if we can help her out. She has an appointment June 9th but would like to know what can she do in the mean time.  She states last time she was in that doctor said "this time she would come they would take her off the Plavix"  Please advise.

## 2014-12-22 NOTE — Telephone Encounter (Signed)
Left message for pt to call back  °

## 2014-12-22 NOTE — Telephone Encounter (Signed)
Spoke w/ pt.  Advised her of Dr. Tyrell Antonio recommendation. She states that she feels that her sx are r/t Plavix or an interaction of one of her meds w/ the Plavix.  Offered her nurse appt to come in for EKG, but she prefers to see an MD. She will call her PCPs office to see if they can work her in for an appt.  Asked her to call back if she is unable to be seen.

## 2014-12-25 ENCOUNTER — Other Ambulatory Visit: Payer: Self-pay | Admitting: Internal Medicine

## 2014-12-25 DIAGNOSIS — R1030 Lower abdominal pain, unspecified: Secondary | ICD-10-CM

## 2014-12-31 ENCOUNTER — Ambulatory Visit
Admission: RE | Admit: 2014-12-31 | Discharge: 2014-12-31 | Disposition: A | Discharge status: Home / Self Care | Payer: Medicare Other | Source: Ambulatory Visit | Attending: Internal Medicine | Admitting: Internal Medicine

## 2014-12-31 DIAGNOSIS — R1030 Lower abdominal pain, unspecified: Secondary | ICD-10-CM

## 2015-01-01 ENCOUNTER — Other Ambulatory Visit: Payer: Self-pay | Admitting: Cardiovascular Disease

## 2015-01-01 ENCOUNTER — Ambulatory Visit
Admission: RE | Admit: 2015-01-01 | Discharge: 2015-01-01 | Disposition: A | Discharge status: Home / Self Care | Payer: Medicare Other | Source: Ambulatory Visit | Attending: Internal Medicine | Admitting: Internal Medicine

## 2015-01-01 DIAGNOSIS — R1030 Lower abdominal pain, unspecified: Secondary | ICD-10-CM | POA: Diagnosis present

## 2015-01-01 DIAGNOSIS — K8020 Calculus of gallbladder without cholecystitis without obstruction: Secondary | ICD-10-CM | POA: Insufficient documentation

## 2015-01-04 DIAGNOSIS — Z87898 Personal history of other specified conditions: Secondary | ICD-10-CM | POA: Insufficient documentation

## 2015-01-04 DIAGNOSIS — Z85828 Personal history of other malignant neoplasm of skin: Secondary | ICD-10-CM | POA: Insufficient documentation

## 2015-01-28 ENCOUNTER — Encounter: Payer: Self-pay | Admitting: Cardiovascular Disease

## 2015-01-28 ENCOUNTER — Ambulatory Visit (INDEPENDENT_AMBULATORY_CARE_PROVIDER_SITE_OTHER): Payer: Medicare Other | Admitting: Cardiovascular Disease

## 2015-01-28 VITALS — BP 120/60 | HR 68 | Ht 62.0 in | Wt 115.5 lb

## 2015-01-28 DIAGNOSIS — R5383 Other fatigue: Secondary | ICD-10-CM | POA: Diagnosis not present

## 2015-01-28 DIAGNOSIS — E785 Hyperlipidemia, unspecified: Secondary | ICD-10-CM

## 2015-01-28 DIAGNOSIS — I480 Paroxysmal atrial fibrillation: Secondary | ICD-10-CM

## 2015-01-28 DIAGNOSIS — I5022 Chronic systolic (congestive) heart failure: Secondary | ICD-10-CM | POA: Diagnosis not present

## 2015-01-28 DIAGNOSIS — I2510 Atherosclerotic heart disease of native coronary artery without angina pectoris: Secondary | ICD-10-CM | POA: Diagnosis not present

## 2015-01-28 MED ORDER — ATORVASTATIN CALCIUM 10 MG PO TABS: 10.0000 mg | ORAL_TABLET | Freq: Every day | ORAL | Status: DC

## 2015-01-28 NOTE — Assessment & Plan Note (Signed)
Please doing reasonably well with no symptoms suggestive of angina. Continue medical therapy. It has been more than 2 years since her myocardial infarction and stenting. Thus, I decided to discontinue treatment with Plavix. Continue lifelong therapy with aspirin.

## 2015-01-28 NOTE — Assessment & Plan Note (Signed)
Given that stopping atorvastatin did not change any of her symptoms, I recommend resuming atorvastatin. I decided to resume at a lower dose of 10 mg daily given her reported myalgia.

## 2015-01-28 NOTE — Progress Notes (Signed)
Primary care physician: Dr. Doy Hutching  HPI  This is a pleasant 80 year old female who is here today for followup visit. She presented on August 17, 2012 with chest pain of 2 days' duration. She was noted to have anterior ST elevation with Q waves. She was in A. fib with RVR and was in cardiogenic shock. I performed emergent cardiac catheterization which showed an occluded mid LAD and 95% stenosis in the proximal RCA. 2 drug-eluting stents were placed in the mid LAD and 1 drug-eluting stent to the proximal RCA. Ejection fraction was 25-30% with akinesis of the mid distal anterior, apical and distal inferior wall. She had sluggish flow in the LAD post PCI likely due to late presentation. Most recent ejection fraction was 45-50% in September, 2014. She has known history of non-Hodgkin's lymphoma.  She has been doing well from a cardiac standpoint he denies any chest pain or palpitations. Her partner of 27 years died in 2022-12-08. Since then, she has been under significant stress and she seems to be depressed. She complains of chronic fatigue and dyspnea. She was taken off atorvastatin to see if it was contributing to her symptoms. However, she had no change in symptoms since then. She denies any chest pain. He continues to complain of excessive bruising with Plavix.   Allergies  Allergen Reactions  . Ace Inhibitors   . Benadryl [Diphenhydramine Hcl]   . Codeine   . Hydrocodone      Current Outpatient Prescriptions on File Prior to Visit  Medication Sig Dispense Refill  . acetaminophen (TYLENOL) 650 MG CR tablet Take 650 mg by mouth every 8 (eight) hours as needed for pain.    [name] aspirin 81 MG tablet Take 81 mg by mouth daily.    . carvedilol (COREG) 3.125 MG tablet TAKE 1 TABLET BY MOUTH TWICE A DAY WITH FOOD 60 tablet 3  . Cholecalciferol (VITAMIN D-3) 5000 UNITS TABS Take by mouth daily.    . clopidogrel (PLAVIX) 75 MG tablet TAKE 1 TABLET BY MOUTH DAILY 30 tablet 3  . fexofenadine (ALLEGRA) 180  MG tablet Take 180 mg by mouth daily as needed.    . furosemide (LASIX) 20 MG tablet Take 1 tablet (20 mg total) by mouth daily as needed. 30 tablet 3  . latanoprost (XALATAN) 0.005 % ophthalmic solution Place 1 drop into both eyes at bedtime.     [name] LORazepam (ATIVAN) 0.5 MG tablet Take 0.5 mg by mouth every 8 (eight) hours.    [name] losartan (COZAAR) 25 MG tablet Take 0.5 tablets (12.5 mg total) by mouth daily. 30 tablet 3  . Oxybutynin Chloride (DITROPAN PO) Take by mouth as needed.    . pantoprazole (PROTONIX) 40 MG tablet TAKE 1 TABLET BY MOUTH TWICE A DAY 60 tablet 3  . pregabalin (LYRICA) 50 MG capsule Take 50 mg by mouth as needed.    . timolol (TIMOPTIC) 0.5 % ophthalmic solution Place 1 drop into both eyes daily.      No current facility-administered medications on file prior to visit.     Past Medical History  Diagnosis Date  . Urinary incontinence   . Restless leg syndrome   . Non Hodgkin's lymphoma   . Chronic systolic heart failure     EF was 25-35% post MI but improved to 45-50% in 04/2013  . Coronary artery disease 08/20/12    ST elevation myocardial infarction with late presentation and cardiogenic shock. Cardiac catheterization showed occluded mid LAD and 95% stenosis in proximal RCA.  She had 2 drug-eluting stent placements to the mid LAD and one drug-eluting stent placement to the proximal RCA. Ejection fraction was 25%.  . A-fib     A. fib with RVR in the setting of myocardial infarction. Converted to sinus rhythm with amiodarone.  . Hyperlipidemia   . Hypertension   . Atrophic vaginitis   . Insomnia   . Osteoarthritis   . Urinary, incontinence, stress female   . History of gastritis   . Migraine headache   . Fibrocystic breast disease   . Glaucoma   . GERD (gastroesophageal reflux disease)   . Polyposis of colon   . Glaucoma      Past Surgical History  Procedure Laterality Date  . Cardiac catheterization  08/19/2012    [name]; [facility]  . Total abdominal  hysterectomy w/ bilateral salpingoophorectomy    . Coronary angioplasty with stent placement    . Colonoscopy       Family History  Problem Relation Age of Onset  . Heart attack Father   . Heart disease Brother      History   Social History  . Marital Status: Divorced    Spouse Name: N/A  . Number of Children: N/A  . Years of Education: N/A   Occupational History  . Not on file.   Social History Main Topics  . Smoking status: Never Smoker   . Smokeless tobacco: Not on file  . Alcohol Use: No  . Drug Use: No  . Sexual Activity: Not on file   Other Topics Concern  . Not on file   Social History Narrative     PHYSICAL EXAM   BP 120/60 mmHg  Ht 5\' 2"  (1.575 m)  Wt 115 lb 8 oz (52.39 kg)  BMI 21.12 kg/m2 Constitutional: She is oriented to person, place, and time. She appears well-developed and well-nourished. No distress.  HENT: No nasal discharge.  Head: Normocephalic and atraumatic.  Eyes: Pupils are equal and round. Right eye exhibits no discharge. Left eye exhibits no discharge.  Neck: Normal range of motion. Neck supple. No JVD present. No thyromegaly present.  Cardiovascular: Normal rate, regular rhythm, normal heart sounds. Exam reveals no gallop and no friction rub. No murmur heard.  Pulmonary/Chest: Effort normal and breath sounds normal. No stridor. No respiratory distress. She has no wheezes. She has no rales. She exhibits no tenderness.  Abdominal: Soft. Bowel sounds are normal. She exhibits no distension. There is no tenderness. There is no rebound and no guarding.  Musculoskeletal: Normal range of motion. She exhibits no edema and no tenderness.  Neurological: She is alert and oriented to person, place, and time. Coordination normal.  Skin: Skin is warm and dry. Extensive bruising especially in the upper extremities. She is not diaphoretic. No erythema. No pallor.  Psychiatric: She has a normal mood and affect. Her behavior is normal. Judgment and  thought content normal.    EKG: Sinus  Rhythm  -Anterior infarct -age undetermined.   ABNORMAL     ASSESSMENT AND PLAN

## 2015-01-28 NOTE — Assessment & Plan Note (Signed)
Most recent ejection fraction improved to 45-50%. She appears to be euvolemic. She takes Lasix as needed. Continue same medications.

## 2015-01-28 NOTE — Patient Instructions (Signed)
Medication Instructions:  Your physician has recommended you make the following change in your medication:  1)STOP taking plavix 2) START taking atorvastatin 10mg  once a day   Labwork: none  Testing/Procedures: none  Follow-Up: Your physician wants you to follow-up in: six months with Dr. Fletcher Anon.  You will receive a reminder letter in the mail two months in advance. If you don't receive a letter, please call our office to schedule the follow-up appointment.   Any Other Special Instructions Will Be Listed Below (If Applicable).

## 2015-03-03 ENCOUNTER — Other Ambulatory Visit: Payer: Self-pay | Admitting: *Deleted

## 2015-03-03 DIAGNOSIS — C8590 Non-Hodgkin lymphoma, unspecified, unspecified site: Secondary | ICD-10-CM

## 2015-03-04 ENCOUNTER — Ambulatory Visit
Admission: RE | Admit: 2015-03-04 | Discharge: 2015-03-04 | Disposition: A | Discharge status: Home / Self Care | Payer: Medicare Other | Source: Ambulatory Visit | Attending: Oncology | Admitting: Oncology

## 2015-03-04 DIAGNOSIS — Z79899 Other long term (current) drug therapy: Secondary | ICD-10-CM | POA: Insufficient documentation

## 2015-03-04 DIAGNOSIS — C8590 Non-Hodgkin lymphoma, unspecified, unspecified site: Secondary | ICD-10-CM | POA: Diagnosis not present

## 2015-03-04 LAB — GLUCOSE, CAPILLARY: Glucose-Capillary: 93 mg/dL (ref 65–99)

## 2015-03-04 MED ORDER — FLUDEOXYGLUCOSE F - 18 (FDG) INJECTION
12.2000 | Freq: Once | INTRAVENOUS | Status: AC | PRN
  Administered 2015-03-04: 12.2 via INTRAVENOUS

## 2015-03-05 ENCOUNTER — Other Ambulatory Visit: Payer: Self-pay | Admitting: *Deleted

## 2015-03-05 DIAGNOSIS — C8590 Non-Hodgkin lymphoma, unspecified, unspecified site: Secondary | ICD-10-CM

## 2015-03-08 ENCOUNTER — Inpatient Hospital Stay: Payer: Medicare Other

## 2015-03-08 ENCOUNTER — Inpatient Hospital Stay: Payer: Medicare Other | Attending: Oncology | Admitting: Oncology

## 2015-03-08 VITALS — BP 130/71 | HR 62 | Temp 96.0°F | Wt 116.6 lb

## 2015-03-08 DIAGNOSIS — I5022 Chronic systolic (congestive) heart failure: Secondary | ICD-10-CM | POA: Diagnosis not present

## 2015-03-08 DIAGNOSIS — E785 Hyperlipidemia, unspecified: Secondary | ICD-10-CM | POA: Diagnosis not present

## 2015-03-08 DIAGNOSIS — N6019 Diffuse cystic mastopathy of unspecified breast: Secondary | ICD-10-CM | POA: Diagnosis not present

## 2015-03-08 DIAGNOSIS — I4891 Unspecified atrial fibrillation: Secondary | ICD-10-CM | POA: Diagnosis not present

## 2015-03-08 DIAGNOSIS — I2510 Atherosclerotic heart disease of native coronary artery without angina pectoris: Secondary | ICD-10-CM | POA: Insufficient documentation

## 2015-03-08 DIAGNOSIS — C8590 Non-Hodgkin lymphoma, unspecified, unspecified site: Secondary | ICD-10-CM | POA: Insufficient documentation

## 2015-03-08 DIAGNOSIS — R531 Weakness: Secondary | ICD-10-CM

## 2015-03-08 DIAGNOSIS — G2581 Restless legs syndrome: Secondary | ICD-10-CM | POA: Diagnosis not present

## 2015-03-08 DIAGNOSIS — Z8669 Personal history of other diseases of the nervous system and sense organs: Secondary | ICD-10-CM | POA: Insufficient documentation

## 2015-03-08 DIAGNOSIS — M545 Low back pain: Secondary | ICD-10-CM

## 2015-03-08 DIAGNOSIS — R32 Unspecified urinary incontinence: Secondary | ICD-10-CM | POA: Insufficient documentation

## 2015-03-08 DIAGNOSIS — I252 Old myocardial infarction: Secondary | ICD-10-CM | POA: Insufficient documentation

## 2015-03-08 DIAGNOSIS — M1990 Unspecified osteoarthritis, unspecified site: Secondary | ICD-10-CM | POA: Diagnosis not present

## 2015-03-08 LAB — CBC WITH DIFFERENTIAL/PLATELET
BASOS ABS: 0.1 10*3/uL (ref 0–0.1)
Basophils Relative: 1 %
Eosinophils Absolute: 0.2 10*3/uL (ref 0–0.7)
Eosinophils Relative: 3 %
HCT: 38.4 % (ref 35.0–47.0)
Hemoglobin: 12.9 g/dL (ref 12.0–16.0)
LYMPHS ABS: 2.6 10*3/uL (ref 1.0–3.6)
LYMPHS PCT: 44 %
MCH: 31.8 pg (ref 26.0–34.0)
MCHC: 33.6 g/dL (ref 32.0–36.0)
MCV: 94.6 fL (ref 80.0–100.0)
Monocytes Absolute: 0.5 10*3/uL (ref 0.2–0.9)
Monocytes Relative: 8 %
NEUTROS PCT: 44 %
Neutro Abs: 2.6 10*3/uL (ref 1.4–6.5)
Platelets: 131 10*3/uL — ABNORMAL LOW (ref 150–440)
RBC: 4.06 MIL/uL (ref 3.80–5.20)
RDW: 13.7 % (ref 11.5–14.5)
WBC: 5.9 10*3/uL (ref 3.6–11.0)

## 2015-03-08 LAB — COMPREHENSIVE METABOLIC PANEL
ALT: 12 U/L — AB (ref 14–54)
ANION GAP: 5 (ref 5–15)
AST: 15 U/L (ref 15–41)
Albumin: 4.1 g/dL (ref 3.5–5.0)
Alkaline Phosphatase: 58 U/L (ref 38–126)
BUN: 20 mg/dL (ref 6–20)
CALCIUM: 8.6 mg/dL — AB (ref 8.9–10.3)
CO2: 28 mmol/L (ref 22–32)
Chloride: 104 mmol/L (ref 101–111)
Creatinine, Ser: 0.82 mg/dL (ref 0.44–1.00)
GFR calc Af Amer: >60 mL/min (ref >60)
GFR calc non Af Amer: >60 mL/min (ref >60)
GLUCOSE: 93 mg/dL (ref 65–99)
Potassium: 4 mmol/L (ref 3.5–5.1)
SODIUM: 137 mmol/L (ref 135–145)
Total Bilirubin: 0.8 mg/dL (ref 0.3–1.2)
Total Protein: 6.4 g/dL — ABNORMAL LOW (ref 6.5–8.1)

## 2015-03-08 LAB — LACTATE DEHYDROGENASE: LDH: 125 U/L (ref 98–192)

## 2015-03-08 MED ORDER — LORAZEPAM 1 MG PO TABS: 1.0000 mg | ORAL_TABLET | Freq: Once | ORAL | Status: DC

## 2015-03-08 NOTE — Progress Notes (Signed)
Patient does have living will. Never smoked. 

## 2015-03-09 ENCOUNTER — Encounter: Payer: Self-pay | Admitting: Oncology

## 2015-03-09 NOTE — Progress Notes (Signed)
[place] @ [facility] [phone]  Fax:([phone]) [place]: 12-21-1934  MR#: [id number]  KZL#:[id number]  Patient Care Team: [name], MD as PCP - General (Unknown Physician Specialty)  CHIEF COMPLAINT:  Chief Complaint  Patient presents with  . 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  Rituxan once a week started in January of 2013 3.  Status post Zevalin therapy in 2014 Patient had acute MI in December of 2014 Also had upper and lower endoscopy done in May of 2015     Lymphoma in remission    80 year old lady with low-grade lymphoma INTERVAL HISTORY: Jeanne Pittman came today further follow-up is noticed puffiness in the left axillary area. Patient also had a recent PET scan done.  Continues to feel somewhat weak and tired.  No nausea.  No vomiting.  No diarrhea.  No significant weight loss.  No night sweats.  No chills or fever.  REVIEW OF SYSTEMS:   GENERAL:  Feels weak and tired  No fevers, sweats or weight loss. PERFORMANCE STATUS (ECOG):01 HEENT:  No visual changes, runny nose, sore throat, mouth sores or tenderness. Lungs: No shortness of breath or cough.  No hemoptysis. Cardiac:  No chest pain, palpitations, orthopnea, or PND. GI:  No nausea, vomiting, diarrhea, constipation, melena or hematochezia. GU:  No urgency, frequency, dysuria, or hematuria. Musculoskeletal: Pain radiating down to left lower extremity.. Extremities:  No pain or swelling. Skin:  No rashes or skin changes. Neuro:  numbness or weakness, balance or coordination issues.  Occasional headache Endocrine:  No diabetes, thyroid issues, hot  flashes or night sweats. Psych:  No mood changes, depression or anxiety. Pain:  No focal pain. Review of systems:  All other systems reviewed and found to be negative. As per HPI. Otherwise, a complete review of systems is negatve.  PAST MEDICAL HISTORY: Past Medical History  Diagnosis Date  . Urinary incontinence   . Restless leg syndrome   . Non Hodgkin's lymphoma   . Chronic systolic heart failure     EF was 25-35% post MI but improved to 45-50% in 04/2013  . Coronary artery disease 08/20/12    ST elevation myocardial infarction with late presentation and cardiogenic shock. Cardiac catheterization showed occluded mid LAD and 95% stenosis in proximal RCA. She had 2 drug-eluting stent placements to the mid LAD and one drug-eluting stent placement to the proximal RCA. Ejection fraction was 25%.  . A-fib     A. fib with RVR in the setting of myocardial infarction. Converted to sinus rhythm with amiodarone.  . Hyperlipidemia   . Hypertension   . Atrophic vaginitis   . Insomnia   . Osteoarthritis   . Urinary, incontinence, stress female   . History of gastritis   . Migraine headache   . Fibrocystic breast disease   . Glaucoma   . GERD (gastroesophageal reflux disease)   . Polyposis of colon   . Glaucoma     PAST SURGICAL HISTORY: Past Surgical History  Procedure Laterality Date  . Cardiac catheterization  08/19/2012    [name]; [facility]  . Total abdominal hysterectomy w/ bilateral salpingoophorectomy    . Coronary angioplasty with  stent placement    . Colonoscopy      FAMILY HISTORY Family History  Problem Relation Age of Onset  . Heart attack Father   . Heart disease Brother     ADVANCED DIRECTIVES:  Patient does have advance healthcare directive, Patient   does not desire to make any changes HEALTH MAINTENANCE: History  Substance Use Topics  . Smoking status: Never Smoker   . Smokeless tobacco: Not on file  . Alcohol Use: No      Allergies  Allergen Reactions    . Ace Inhibitors   . Benadryl [Diphenhydramine Hcl]   . Codeine   . Hydrocodone     Current Outpatient Prescriptions  Medication Sig Dispense Refill  . acetaminophen (TYLENOL) 650 MG CR tablet Take 650 mg by mouth every 8 (eight) hours as needed for pain.    [name] aspirin 81 MG tablet Take 81 mg by mouth daily.    [name] atorvastatin (LIPITOR) 10 MG tablet Take 1 tablet (10 mg total) by mouth daily. 30 tablet 5  . carvedilol (COREG) 3.125 MG tablet TAKE 1 TABLET BY MOUTH TWICE A DAY WITH FOOD 60 tablet 3  . Cholecalciferol (VITAMIN D-3) 5000 UNITS TABS Take by mouth daily.    . fexofenadine (ALLEGRA) 180 MG tablet Take 180 mg by mouth daily as needed.    . furosemide (LASIX) 20 MG tablet Take 1 tablet (20 mg total) by mouth daily as needed. 30 tablet 3  . latanoprost (XALATAN) 0.005 % ophthalmic solution Place 1 drop into both eyes at bedtime.     [name] LORazepam (ATIVAN) 0.5 MG tablet Take 0.5 mg by mouth every 8 (eight) hours.    [name] losartan (COZAAR) 25 MG tablet Take 0.5 tablets (12.5 mg total) by mouth daily. 30 tablet 3  . Oxybutynin Chloride (DITROPAN PO) Take by mouth as needed.    . pantoprazole (PROTONIX) 40 MG tablet TAKE 1 TABLET BY MOUTH TWICE A DAY 60 tablet 3  . pregabalin (LYRICA) 50 MG capsule Take 50 mg by mouth as needed.    . timolol (TIMOPTIC) 0.5 % ophthalmic solution Place 1 drop into both eyes daily.     [name] LORazepam (ATIVAN) 1 MG tablet Take 1 tablet (1 mg total) by mouth once. Take 1 hour prior to MRI on 7/25 1 tablet 0   No current facility-administered medications for this visit.    OBJECTIVE:  Filed Vitals:   03/08/15 1540  BP: 130/71  Pulse: 62  Temp: 96 F (35.6 C)     Body mass index is 21.33 kg/(m^2).    ECOG FS:1 - Symptomatic but completely ambulatory  PHYSICAL EXAM: GENERAL:  Well developed, well nourished, sitting comfortably in the exam room in no acute distress. MENTAL STATUS:  Alert and oriented to person, place and time. HEAD:  Normocephalic,  atraumatic, face symmetric, no Cushingoid features.  ENT:  Oropharynx clear without lesion.  Tongue normal. Mucous membranes moist.  RESPIRATORY:  Clear to auscultation without rales, wheezes or rhonchi. CARDIOVASCULAR:  Regular rate and rhythm without murmur, rub or gallop.  ABDOMEN:  Soft, non-tender, with active bowel sounds, and no hepatosplenomegaly.  No masses. BACK:  No CVA tenderness.  No tenderness on percussion of the back or rib cage. SKIN:  No rashes, ulcers or lesions. EXTREMITIES: No edema, no skin discoloration or tenderness.  No palpable cords. LYMPH NODES: Patient had palpable left axillary lymph node.  Small palpable right axillary lymph node.  Small palpable inguinal lymph node.  Left  axillary lymph node is approximately 2 cm NEUROLOGICAL: Unremarkable. PSYCH:  Appropriate.   LAB RESULTS:  Appointment on 03/08/2015  Component Date Value Ref Range Status  . WBC 03/08/2015 5.9  3.6 - 11.0 K/uL Final  . RBC 03/08/2015 4.06  3.80 - 5.20 MIL/uL Final  . Hemoglobin 03/08/2015 12.9  12.0 - 16.0 g/dL Final  . HCT 03/08/2015 38.4  35.0 - 47.0 % Final  . MCV 03/08/2015 94.6  80.0 - 100.0 fL Final  . MCH 03/08/2015 31.8  26.0 - 34.0 pg Final  . MCHC 03/08/2015 33.6  32.0 - 36.0 g/dL Final  . RDW 03/08/2015 13.7  11.5 - 14.5 % Final  . Platelets 03/08/2015 131* 150 - 440 K/uL Final  . Neutrophils Relative % 03/08/2015 44   Final  . Neutro Abs 03/08/2015 2.6  1.4 - 6.5 K/uL Final  . Lymphocytes Relative 03/08/2015 44   Final  . Lymphs Abs 03/08/2015 2.6  1.0 - 3.6 K/uL Final  . Monocytes Relative 03/08/2015 8   Final  . Monocytes Absolute 03/08/2015 0.5  0.2 - 0.9 K/uL Final  . Eosinophils Relative 03/08/2015 3   Final  . Eosinophils Absolute 03/08/2015 0.2  0 - 0.7 K/uL Final  . Basophils Relative 03/08/2015 1   Final  . Basophils Absolute 03/08/2015 0.1  0 - 0.1 K/uL Final  . Sodium 03/08/2015 137  135 - 145 mmol/L Final  . Potassium 03/08/2015 4.0  3.5 - 5.1 mmol/L  Final  . Chloride 03/08/2015 104  101 - 111 mmol/L Final  . CO2 03/08/2015 28  22 - 32 mmol/L Final  . Glucose, Bld 03/08/2015 93  65 - 99 mg/dL Final  . BUN 03/08/2015 20  6 - 20 mg/dL Final  . Creatinine, Ser 03/08/2015 0.82  0.44 - 1.00 mg/dL Final  . Calcium 03/08/2015 8.6* 8.9 - 10.3 mg/dL Final  . Total Protein 03/08/2015 6.4* 6.5 - 8.1 g/dL Final  . Albumin 03/08/2015 4.1  3.5 - 5.0 g/dL Final  . AST 03/08/2015 15  15 - 41 U/L Final  . ALT 03/08/2015 12* 14 - 54 U/L Final  . Alkaline Phosphatase 03/08/2015 58  38 - 126 U/L Final  . Total Bilirubin 03/08/2015 0.8  0.3 - 1.2 mg/dL Final  . GFR calc non Af Amer 03/08/2015 >60  >60 mL/min Final  . GFR calc Af Amer 03/08/2015 >60  >60 mL/min Final   Comment: (NOTE) The eGFR has been calculated using the CKD EPI equation. This calculation has not been validated in all clinical situations. eGFR's persistently <60 mL/min signify possible Chronic Kidney Disease.   . Anion gap 03/08/2015 5  5 - 15 Final  . LDH 03/08/2015 125  98 - 192 U/L Final      STUDIES: Nm Pet Image Restag (ps) Skull Base To Thigh  03/04/2015   CLINICAL DATA:  Subsequent treatment strategy for lymphoma. Non-Hodgkin's lymphoma.  EXAM: NUCLEAR MEDICINE PET SKULL BASE TO THIGH  TECHNIQUE: 12.2 mCi F-18 FDG was injected intravenously. Full-ring PET imaging was performed from the skull base to thigh after the radiotracer. CT data was obtained and used for attenuation correction and anatomic localization.  FASTING BLOOD GLUCOSE:  Value: 93 mg/dl  COMPARISON:  PET-CT 02/04/2014  FINDINGS: NECK  Mild increase in size and metabolic activity of the cervical lymph nodes. Example lymph node measures 8 mm in the left level IIa position with SUV max equal 3.0.  CHEST  Interval increase in size and metabolic activity of bilateral  axillary adenopathy. For example 17 mm short axis left axillary lymph node is increased from 13 mm on prior and now has SUV max equal 3.9 compared to 1.9.   No suspicious pulmonary nodules  ABDOMEN/PELVIS  Interval increase in metabolic activity of periaortic lymphadenopathy as well as increased size. For example lymph node left of the aorta on image 152, series 4 measures 14 mm compared to 10 mm with SUV max equal 3.9 compared to 3.1. The spleen is normal metabolic activity and does not appear enlarged. No abnormal metabolic activity liver.  There is increased metabolic activity of internal and external iliac lymph nodes. For example right external iliac lymph node measures 13 mm short axis increased from 10 mm SUV max equal 2.5 compared to 2.0.  SKELETON  No abnormal marrow activity.  IMPRESSION: 1. Interval increase in size and metabolic activity of cervical lymph nodes, axillary lymph nodes, retroperitoneal lymph nodes and iliac lymph nodes. The most dramatic increases is within the axillary lymph nodes. 2. No evidence of marrow involvement.  Normal spleen.   Electronically Signed   By: Suzy Bouchard M.D.   On: 03/04/2015 10:54    ASSESSMENT: Recurrent lymphoma with recent PET scan showing increase in the activity. Patient does have palpable left axillary lymph node Low back pain pain radiating down to left lower extremity may not be related to lymphoma.  MEDICAL DECISION MAKING:  Scan has been reviewed. I discussed findings of the PET scan and our recommendation would be either observation or biopsy of left axillary lymph node to see whether there is any change in pathology Patient has puffiness in the left axillary area may be related to an large lymph node Low back pain is probably related to disc disease Patient was advised to continue Lyrica on a daily basis MRI scan of lumbosacral spine and if needed and particularly if lymphoma is the cause of the pain has been ruled out and pain clinic appointment All lab data has been reviewed  Patient expressed understanding and was in agreement with this plan. She also understands that She can call  clinic at any time with any questions, concerns, or complaints.    No matching staging information was found for the patient.  Forest Gleason, MD   03/09/2015 10:56 AM

## 2015-03-10 ENCOUNTER — Encounter: Payer: Self-pay | Admitting: General Surgery

## 2015-03-11 ENCOUNTER — Ambulatory Visit (INDEPENDENT_AMBULATORY_CARE_PROVIDER_SITE_OTHER): Payer: Medicare Other | Admitting: General Surgery

## 2015-03-11 ENCOUNTER — Encounter: Payer: Self-pay | Admitting: General Surgery

## 2015-03-11 VITALS — BP 118/60 | HR 66 | Resp 14 | Ht 61.0 in | Wt 114.0 lb

## 2015-03-11 DIAGNOSIS — I2510 Atherosclerotic heart disease of native coronary artery without angina pectoris: Secondary | ICD-10-CM

## 2015-03-11 DIAGNOSIS — C8590 Non-Hodgkin lymphoma, unspecified, unspecified site: Secondary | ICD-10-CM

## 2015-03-11 NOTE — Progress Notes (Signed)
Patient ID: Jeanne Pittman, female   DOB: 1934/10/01, 80 y.o.   MRN: [id number]  Chief Complaint  Patient presents with  . Other    axillary node    HPI Jeanne Pittman is a 80 y.o. female.  Here today for evaluation of a left axillary lymph node. She states she had some left axillary pain and back pain and Dr. Oliva Bustard ordered a PET scan. She has a known history of Non Hodgkins lymphoma. She has an MRI of her back scheduled for Monday 03-15-15. She denies any significant weight loss and does have occasional night sweats. She was seen in 2012 for right axillary node which showed to be a recurrence of the lymphoma.   HPI  Past Medical History  Diagnosis Date  . Urinary incontinence   . Restless leg syndrome   . Chronic systolic heart failure     EF was 25-35% post MI but improved to 45-50% in 04/2013  . Coronary artery disease 08/20/12    ST elevation myocardial infarction with late presentation and cardiogenic shock. Cardiac catheterization showed occluded mid LAD and 95% stenosis in proximal RCA. She had 2 drug-eluting stent placements to the mid LAD and one drug-eluting stent placement to the proximal RCA. Ejection fraction was 25%.  . A-fib     A. fib with RVR in the setting of myocardial infarction. Converted to sinus rhythm with amiodarone.  . Hyperlipidemia   . Hypertension   . Atrophic vaginitis   . Insomnia   . Osteoarthritis   . Urinary, incontinence, stress female   . History of gastritis   . Migraine headache   . Fibrocystic breast disease   . Glaucoma   . GERD (gastroesophageal reflux disease)   . Polyposis of colon   . Glaucoma   . Non Hodgkin's lymphoma 2005    reoccurance 2007 and 2012    Past Surgical History  Procedure Laterality Date  . Cardiac catheterization  08/19/2012    [name]; [facility]  . Total abdominal hysterectomy w/ bilateral salpingoophorectomy    . Coronary angioplasty with stent placement    . Colonoscopy    . Abdominal hysterectomy  1956  .  Lymph node biopsy Right 07-24-11    Dr Bary Castilla    Family History  Problem Relation Age of Onset  . Heart attack Father   . Heart disease Brother     Social History History  Substance Use Topics  . Smoking status: Never Smoker   . Smokeless tobacco: Never Used  . Alcohol Use: No    Allergies  Allergen Reactions  . Ace Inhibitors   . Benadryl [Diphenhydramine Hcl]   . Codeine   . Hydrocodone     Current Outpatient Prescriptions  Medication Sig Dispense Refill  . acetaminophen (TYLENOL) 650 MG CR tablet Take 650 mg by mouth every 8 (eight) hours as needed for pain.    [name] aspirin 81 MG tablet Take 81 mg by mouth daily.    [name] atorvastatin (LIPITOR) 10 MG tablet Take 1 tablet (10 mg total) by mouth daily. 30 tablet 5  . carvedilol (COREG) 3.125 MG tablet TAKE 1 TABLET BY MOUTH TWICE A DAY WITH FOOD 60 tablet 3  . Cholecalciferol (VITAMIN D-3) 5000 UNITS TABS Take by mouth daily.    . fexofenadine (ALLEGRA) 180 MG tablet Take 180 mg by mouth daily as needed.    . furosemide (LASIX) 20 MG tablet Take 1 tablet (20 mg total) by mouth daily as needed. 30 tablet 3  .  latanoprost (XALATAN) 0.005 % ophthalmic solution Place 1 drop into both eyes at bedtime.     [name] LORazepam (ATIVAN) 0.5 MG tablet Take 0.5 mg by mouth every 8 (eight) hours.    [name] LORazepam (ATIVAN) 1 MG tablet Take 1 tablet (1 mg total) by mouth once. Take 1 hour prior to MRI on 7/25 1 tablet 0  . losartan (COZAAR) 25 MG tablet Take 0.5 tablets (12.5 mg total) by mouth daily. 30 tablet 3  . Oxybutynin Chloride (DITROPAN PO) Take by mouth as needed.    . pantoprazole (PROTONIX) 40 MG tablet TAKE 1 TABLET BY MOUTH TWICE A DAY 60 tablet 3  . pregabalin (LYRICA) 50 MG capsule Take 50 mg by mouth as needed.    . timolol (TIMOPTIC) 0.5 % ophthalmic solution Place 1 drop into both eyes daily.      No current facility-administered medications for this visit.    Review of Systems Review of Systems  Constitutional: Positive for  appetite change.  Respiratory: Negative.   Cardiovascular: Negative.     Blood pressure 118/60, pulse 66, resp. rate 14, height 5\' 1"  (1.549 m), weight 114 lb (51.71 kg).  Physical Exam Physical Exam  Constitutional: She is oriented to person, place, and time. She appears well-developed and well-nourished.  HENT:  Mouth/Throat: Oropharynx is clear and moist.  Eyes: Conjunctivae are normal. No scleral icterus.  Neck: Neck supple.  Cardiovascular: Normal rate, regular rhythm and normal heart sounds.   Pulses:      Femoral pulses are 2+ on the right side, and 2+ on the left side. Pulmonary/Chest: Effort normal and breath sounds normal.  Lymphadenopathy:    She has no cervical adenopathy.    She has axillary adenopathy.       Right: No inguinal adenopathy present.       Left: No inguinal adenopathy present.  2 cm right axillary node and a 3 cm left axillary node  Neurological: She is alert and oriented to person, place, and time.  Skin: Skin is warm and dry.  Psychiatric: She has a normal mood and affect.    Data Reviewed Medical oncology notes. Personal phone contact with medical oncology.  Assessment    Recurrent lymphoma.    Plan    Formal node excision is requested by pathology due to possible change in cell type. The procedure was reviewed with the patient. Her last biopsy was a core biopsy, but due to the request for medical oncology a formal excisional biopsy will be performed under anesthesia.     Excisional lymph node biopsy at [facility].  Patient wishes to talk with son and then call back to arrange a date for surgery.   Patient is scheduled for surgery at [facility] on 03/18/15. She will pre admit at the hospital on 03/15/15 at 7:30 am. Patient is aware of dates, time, and instructions.   PCP:  [place] Reek Dr. Zenia Resides, Forest Gleason 03/13/2015, 7:53 AM

## 2015-03-11 NOTE — Patient Instructions (Addendum)
Excisional biopsy left axillary lymph node at [facility] with SDS.  The patient is aware to call back for any questions or concerns.  Patient is scheduled for surgery at [facility] on 03/18/15. She will pre admit at the hospital on 03/15/15 at 7:30 am. Patient is aware of dates, time, and instructions.

## 2015-03-13 NOTE — H&P (Signed)
Patient ID: KALLEN DELATORRE, female DOB: September 12, 1934, 80 y.o. MRN: [id number]  Chief Complaint   Patient presents with   .  Other     axillary node    HPI  Jeanne Pittman is a 80 y.o. female. Here today for evaluation of a left axillary lymph node. She states she had some left axillary pain and back pain and Dr. Oliva Bustard ordered a PET scan. She has a known history of Non Hodgkins lymphoma. She has an MRI of her back scheduled for Monday 03-15-15.  She denies any significant weight loss and does have occasional night sweats.  She was seen in 2012 for right axillary node which showed to be a recurrence of the lymphoma.  HPI  Past Medical History   Diagnosis  Date   .  Urinary incontinence    .  Restless leg syndrome    .  Chronic systolic heart failure      EF was 25-35% post MI but improved to 45-50% in 04/2013   .  Coronary artery disease  08/20/12     ST elevation myocardial infarction with late presentation and cardiogenic shock. Cardiac catheterization showed occluded mid LAD and 95% stenosis in proximal RCA. She had 2 drug-eluting stent placements to the mid LAD and one drug-eluting stent placement to the proximal RCA. Ejection fraction was 25%.   .  A-fib      A. fib with RVR in the setting of myocardial infarction. Converted to sinus rhythm with amiodarone.   .  Hyperlipidemia    .  Hypertension    .  Atrophic vaginitis    .  Insomnia    .  Osteoarthritis    .  Urinary, incontinence, stress female    .  History of gastritis    .  Migraine headache    .  Fibrocystic breast disease    .  Glaucoma    .  GERD (gastroesophageal reflux disease)    .  Polyposis of colon    .  Glaucoma    .  Non Hodgkin's lymphoma  2005     reoccurance 2007 and 2012    Past Surgical History   Procedure  Laterality  Date   .  Cardiac catheterization   08/19/2012     [name]; [facility]   .  Total abdominal hysterectomy w/ bilateral salpingoophorectomy     .  Coronary angioplasty with stent placement     .   Colonoscopy     .  Abdominal hysterectomy   1956   .  Lymph node biopsy  Right  07-24-11     Dr Bary Castilla    Family History   Problem  Relation  Age of Onset   .  Heart attack  Father    .  Heart disease  Brother     Social History  History   Substance Use Topics   .  Smoking status:  Never Smoker   .  Smokeless tobacco:  Never Used   .  Alcohol Use:  No    Allergies   Allergen  Reactions   .  Ace Inhibitors    .  Benadryl [Diphenhydramine Hcl]    .  Codeine    .  Hydrocodone     Current Outpatient Prescriptions   Medication  Sig  Dispense  Refill   .  acetaminophen (TYLENOL) 650 MG CR tablet  Take 650 mg by mouth every 8 (eight) hours as needed for pain.     [name]  aspirin 81 MG tablet  Take 81 mg by mouth daily.     [name]  atorvastatin (LIPITOR) 10 MG tablet  Take 1 tablet (10 mg total) by mouth daily.  30 tablet  5   .  carvedilol (COREG) 3.125 MG tablet  TAKE 1 TABLET BY MOUTH TWICE A DAY WITH FOOD  60 tablet  3   .  Cholecalciferol (VITAMIN D-3) 5000 UNITS TABS  Take by mouth daily.     .  fexofenadine (ALLEGRA) 180 MG tablet  Take 180 mg by mouth daily as needed.     .  furosemide (LASIX) 20 MG tablet  Take 1 tablet (20 mg total) by mouth daily as needed.  30 tablet  3   .  latanoprost (XALATAN) 0.005 % ophthalmic solution  Place 1 drop into both eyes at bedtime.     [name]  LORazepam (ATIVAN) 0.5 MG tablet  Take 0.5 mg by mouth every 8 (eight) hours.     [name]  LORazepam (ATIVAN) 1 MG tablet  Take 1 tablet (1 mg total) by mouth once. Take 1 hour prior to MRI on 7/25  1 tablet  0   .  losartan (COZAAR) 25 MG tablet  Take 0.5 tablets (12.5 mg total) by mouth daily.  30 tablet  3   .  Oxybutynin Chloride (DITROPAN PO)  Take by mouth as needed.     .  pantoprazole (PROTONIX) 40 MG tablet  TAKE 1 TABLET BY MOUTH TWICE A DAY  60 tablet  3   .  pregabalin (LYRICA) 50 MG capsule  Take 50 mg by mouth as needed.     .  timolol (TIMOPTIC) 0.5 % ophthalmic solution  Place 1 drop into both eyes daily.       No current facility-administered medications for this visit.    Review of Systems  Review of Systems  Constitutional: Positive for appetite change.  Respiratory: Negative.  Cardiovascular: Negative.   Blood pressure 118/60, pulse 66, resp. rate 14, height 5\' 1"  (1.549 m), weight 114 lb (51.71 kg).  Physical Exam  Physical Exam  Constitutional: She is oriented to person, place, and time. She appears well-developed and well-nourished.  HENT:  Mouth/Throat: Oropharynx is clear and moist.  Eyes: Conjunctivae are normal. No scleral icterus.  Neck: Neck supple.  Cardiovascular: Normal rate, regular rhythm and normal heart sounds.  Pulses:  Femoral pulses are 2+ on the right side, and 2+ on the left side.  Pulmonary/Chest: Effort normal and breath sounds normal.  Lymphadenopathy:  She has no cervical adenopathy.  She has axillary adenopathy.  Right: No inguinal adenopathy present.  Left: No inguinal adenopathy present.  2 cm right axillary node and a 3 cm left axillary node  Neurological: She is alert and oriented to person, place, and time.  Skin: Skin is warm and dry.  Psychiatric: She has a normal mood and affect.   Data Reviewed  Medical oncology notes. Personal phone contact with medical oncology.  Assessment   Recurrent lymphoma.   Plan   Formal node excision is requested by pathology due to possible change in cell type. The procedure was reviewed with the patient. Her last biopsy was a core biopsy, but due to the request for medical oncology a formal excisional biopsy will be performed under anesthesia.  Excisional lymph node biopsy at [facility].  Patient wishes to talk with son and then call back to arrange a date for surgery.  Patient is scheduled for surgery at [facility] on  03/18/15. She will pre admit at the hospital on 03/15/15 at 7:30 am. Patient is aware of dates, time, and instructions.  PCP: Fulton Reek  Dr. Zenia Resides, [place] Gleason  03/13/2015, 7:53 AM

## 2015-03-15 ENCOUNTER — Encounter
Admission: RE | Admit: 2015-03-15 | Discharge: 2015-03-15 | Disposition: A | Discharge status: Home / Self Care | Payer: Medicare Other | Source: Ambulatory Visit | Attending: General Surgery | Admitting: General Surgery

## 2015-03-15 ENCOUNTER — Ambulatory Visit
Admission: RE | Admit: 2015-03-15 | Discharge: 2015-03-15 | Disposition: A | Discharge status: Home / Self Care | Payer: Medicare Other | Source: Ambulatory Visit | Attending: Oncology | Admitting: Oncology

## 2015-03-15 DIAGNOSIS — M545 Low back pain: Secondary | ICD-10-CM

## 2015-03-15 DIAGNOSIS — M5186 Other intervertebral disc disorders, lumbar region: Secondary | ICD-10-CM | POA: Diagnosis not present

## 2015-03-15 DIAGNOSIS — M4806 Spinal stenosis, lumbar region: Secondary | ICD-10-CM | POA: Diagnosis not present

## 2015-03-15 DIAGNOSIS — M5386 Other specified dorsopathies, lumbar region: Secondary | ICD-10-CM | POA: Insufficient documentation

## 2015-03-15 DIAGNOSIS — G544 Lumbosacral root disorders, not elsewhere classified: Secondary | ICD-10-CM | POA: Diagnosis not present

## 2015-03-15 DIAGNOSIS — R590 Localized enlarged lymph nodes: Secondary | ICD-10-CM | POA: Insufficient documentation

## 2015-03-15 DIAGNOSIS — M5387 Other specified dorsopathies, lumbosacral region: Secondary | ICD-10-CM | POA: Diagnosis not present

## 2015-03-15 DIAGNOSIS — M47896 Other spondylosis, lumbar region: Secondary | ICD-10-CM | POA: Diagnosis not present

## 2015-03-15 HISTORY — DX: Acute myocardial infarction, unspecified: I21.9

## 2015-03-15 MED ORDER — GADOBENATE DIMEGLUMINE 529 MG/ML IV SOLN
10.0000 mL | Freq: Once | INTRAVENOUS | Status: AC | PRN
Start: 2015-03-15 — End: 2015-03-15
  Administered 2015-03-15: 10 mL via INTRAVENOUS

## 2015-03-15 NOTE — Patient Instructions (Signed)
  Your procedure is scheduled on: Thursday March 18, 2015  Report to Same Day Surgery. To find out your arrival time please call [phone] between 1PM - 3PM on Wednesday March 17, 2015.  Remember: Instructions that are not followed completely may result in serious medical risk, up to and including death, or upon the discretion of your surgeon and anesthesiologist your surgery may need to be rescheduled.    __x__ 1. Do not eat food or drink liquids after midnight. No gum chewing or hard candies.     ____ 2. No Alcohol for 24 hours before or after surgery.   ____ 3. Bring all medications with you on the day of surgery if instructed.    __x__ 4. Notify your doctor if there is any change in your medical condition     (cold, fever, infections).     Do not wear jewelry, make-up, hairpins, clips or nail polish.  Do not wear lotions, powders, or perfumes. You may wear deodorant.  Do not shave 48 hours prior to surgery. Men may shave face and neck.  Do not bring valuables to the hospital.    [facility] is not responsible for any belongings or valuables.               Contacts, dentures or bridgework may not be worn into surgery.  Leave your suitcase in the car. After surgery it may be brought to your room.  For patients admitted to the hospital, discharge time is determined by your treatment team.   Patients discharged the day of surgery will not be allowed to drive home.    Please read over the following fact sheets that you were given:   [facility] Preparing for Surgery  __x__ Take these medicines the morning of surgery with A SIP OF WATER:    1. carvedilol (COREG)  2. losartan (COZAAR)   3. pantoprazole (PROTONIX)  4.LORazepam (ATIVAN) If needed, pt choice    ____ Fleet Enema (as directed)   __x__ Use CHG Soap as directed  ____ Use inhalers on the day of surgery  ____ Stop metformin 2 days prior to surgery    ____ Take 1/2 of usual insulin dose the night before surgery  and none on the morning of surgery.   ____ Stop Coumadin/Plavix/aspirin on does not apply.  ____ Stop Anti-inflammatories on does not apply   __x__ Stop supplementsGinkgo Biloba  until after surgery.    ____ Bring C-Pap to the hospital.

## 2015-03-18 ENCOUNTER — Ambulatory Visit
Admission: RE | Admit: 2015-03-18 | Discharge: 2015-03-18 | Disposition: A | Discharge status: Home / Self Care | Payer: Medicare Other | Source: Ambulatory Visit | Attending: General Surgery | Admitting: General Surgery

## 2015-03-18 ENCOUNTER — Encounter: Payer: Self-pay | Admitting: *Deleted

## 2015-03-18 ENCOUNTER — Ambulatory Visit: Payer: Medicare Other | Admitting: Anesthesiology

## 2015-03-18 ENCOUNTER — Encounter
Admission: RE | Disposition: A | Discharge status: Home / Self Care | Payer: Self-pay | Source: Ambulatory Visit | Attending: General Surgery

## 2015-03-18 DIAGNOSIS — Z885 Allergy status to narcotic agent status: Secondary | ICD-10-CM | POA: Diagnosis not present

## 2015-03-18 DIAGNOSIS — Z7982 Long term (current) use of aspirin: Secondary | ICD-10-CM | POA: Diagnosis not present

## 2015-03-18 DIAGNOSIS — I5022 Chronic systolic (congestive) heart failure: Secondary | ICD-10-CM | POA: Diagnosis not present

## 2015-03-18 DIAGNOSIS — C8590 Non-Hodgkin lymphoma, unspecified, unspecified site: Secondary | ICD-10-CM

## 2015-03-18 DIAGNOSIS — K219 Gastro-esophageal reflux disease without esophagitis: Secondary | ICD-10-CM | POA: Diagnosis not present

## 2015-03-18 DIAGNOSIS — G43909 Migraine, unspecified, not intractable, without status migrainosus: Secondary | ICD-10-CM | POA: Diagnosis not present

## 2015-03-18 DIAGNOSIS — M1990 Unspecified osteoarthritis, unspecified site: Secondary | ICD-10-CM | POA: Insufficient documentation

## 2015-03-18 DIAGNOSIS — I4891 Unspecified atrial fibrillation: Secondary | ICD-10-CM | POA: Insufficient documentation

## 2015-03-18 DIAGNOSIS — I2510 Atherosclerotic heart disease of native coronary artery without angina pectoris: Secondary | ICD-10-CM | POA: Insufficient documentation

## 2015-03-18 DIAGNOSIS — Z8249 Family history of ischemic heart disease and other diseases of the circulatory system: Secondary | ICD-10-CM | POA: Diagnosis not present

## 2015-03-18 DIAGNOSIS — H409 Unspecified glaucoma: Secondary | ICD-10-CM | POA: Diagnosis not present

## 2015-03-18 DIAGNOSIS — Z90710 Acquired absence of both cervix and uterus: Secondary | ICD-10-CM | POA: Diagnosis not present

## 2015-03-18 DIAGNOSIS — I10 Essential (primary) hypertension: Secondary | ICD-10-CM | POA: Diagnosis not present

## 2015-03-18 DIAGNOSIS — Z86010 Personal history of colonic polyps: Secondary | ICD-10-CM | POA: Diagnosis not present

## 2015-03-18 DIAGNOSIS — E785 Hyperlipidemia, unspecified: Secondary | ICD-10-CM | POA: Insufficient documentation

## 2015-03-18 DIAGNOSIS — G2581 Restless legs syndrome: Secondary | ICD-10-CM | POA: Diagnosis not present

## 2015-03-18 DIAGNOSIS — Z955 Presence of coronary angioplasty implant and graft: Secondary | ICD-10-CM | POA: Diagnosis not present

## 2015-03-18 DIAGNOSIS — R32 Unspecified urinary incontinence: Secondary | ICD-10-CM | POA: Insufficient documentation

## 2015-03-18 DIAGNOSIS — G4700 Insomnia, unspecified: Secondary | ICD-10-CM | POA: Insufficient documentation

## 2015-03-18 DIAGNOSIS — Z79899 Other long term (current) drug therapy: Secondary | ICD-10-CM | POA: Insufficient documentation

## 2015-03-18 DIAGNOSIS — N6019 Diffuse cystic mastopathy of unspecified breast: Secondary | ICD-10-CM | POA: Diagnosis not present

## 2015-03-18 DIAGNOSIS — C8510 Unspecified B-cell lymphoma, unspecified site: Secondary | ICD-10-CM | POA: Insufficient documentation

## 2015-03-18 DIAGNOSIS — Z888 Allergy status to other drugs, medicaments and biological substances status: Secondary | ICD-10-CM | POA: Diagnosis not present

## 2015-03-18 HISTORY — PX: AXILLARY LYMPH NODE BIOPSY: SHX5737

## 2015-03-18 SURGERY — AXILLARY LYMPH NODE BIOPSY
Anesthesia: General | Site: Axilla | Laterality: Left | Wound class: Clean

## 2015-03-18 MED ORDER — LIDOCAINE HCL (CARDIAC) 20 MG/ML IV SOLN
INTRAVENOUS | Status: DC | PRN
  Administered 2015-03-18: 50 mg via INTRAVENOUS

## 2015-03-18 MED ORDER — BUPIVACAINE-EPINEPHRINE (PF) 0.5% -1:200000 IJ SOLN
INTRAMUSCULAR | Status: AC
  Filled 2015-03-18: qty 30

## 2015-03-18 MED ORDER — OXYCODONE HCL 5 MG/5ML PO SOLN: 5.0000 mg | Freq: Once | ORAL | Status: DC | PRN

## 2015-03-18 MED ORDER — PROPOFOL 10 MG/ML IV BOLUS
INTRAVENOUS | Status: DC | PRN
  Administered 2015-03-18: 170 mg via INTRAVENOUS

## 2015-03-18 MED ORDER — BUPIVACAINE HCL (PF) 0.5 % IJ SOLN
INTRAMUSCULAR | Status: AC
  Filled 2015-03-18: qty 30

## 2015-03-18 MED ORDER — FENTANYL CITRATE (PF) 100 MCG/2ML IJ SOLN
INTRAMUSCULAR | Status: AC
  Administered 2015-03-18: 25 ug via INTRAVENOUS
  Filled 2015-03-18: qty 2

## 2015-03-18 MED ORDER — BUPIVACAINE-EPINEPHRINE 0.5% -1:200000 IJ SOLN
INTRAMUSCULAR | Status: DC | PRN
  Administered 2015-03-18: 10 mL

## 2015-03-18 MED ORDER — FENTANYL CITRATE (PF) 100 MCG/2ML IJ SOLN
25.0000 ug | INTRAMUSCULAR | Status: DC | PRN
  Administered 2015-03-18 (×4): 25 ug via INTRAVENOUS

## 2015-03-18 MED ORDER — LACTATED RINGERS IV SOLN
INTRAVENOUS | Status: DC
  Administered 2015-03-18 (×2): via INTRAVENOUS

## 2015-03-18 MED ORDER — OXYCODONE HCL 5 MG PO TABS: 5.0000 mg | ORAL_TABLET | Freq: Once | ORAL | Status: DC | PRN

## 2015-03-18 MED ORDER — FENTANYL CITRATE (PF) 100 MCG/2ML IJ SOLN
INTRAMUSCULAR | Status: DC | PRN
  Administered 2015-03-18: 50 ug via INTRAVENOUS

## 2015-03-18 SURGICAL SUPPLY — 29 items
APPLIER CLIP 11 MED OPEN (CLIP) ×3
BLADE SURG 15 STRL SS SAFETY (BLADE) ×3 IMPLANT
CANISTER SUCT 1200ML W/VALVE (MISCELLANEOUS) ×3 IMPLANT
CHLORAPREP W/TINT 26ML (MISCELLANEOUS) ×3 IMPLANT
CLIP APPLIE 11 MED OPEN (CLIP) ×1 IMPLANT
CLOSURE WOUND 1/2 X4 (GAUZE/BANDAGES/DRESSINGS) ×1
DRAPE LAPAROTOMY TRNSV 106X77 (MISCELLANEOUS) ×3 IMPLANT
DRESSING TELFA 4X3 1S ST N-ADH (GAUZE/BANDAGES/DRESSINGS) ×3 IMPLANT
DRSG TEGADERM 4X4.75 (GAUZE/BANDAGES/DRESSINGS) ×3 IMPLANT
GLOVE BIO SURGEON STRL SZ7.5 (GLOVE) ×3 IMPLANT
GLOVE INDICATOR 8.0 STRL GRN (GLOVE) ×3 IMPLANT
GOWN STRL REUS W/ TWL LRG LVL3 (GOWN DISPOSABLE) ×2 IMPLANT
GOWN STRL REUS W/TWL LRG LVL3 (GOWN DISPOSABLE) ×4
KIT RM TURNOVER STRD PROC AR (KITS) ×3 IMPLANT
LABEL OR SOLS (LABEL) ×3 IMPLANT
MARGIN MAP 10MM (MISCELLANEOUS) ×3 IMPLANT
NDL SAFETY 25GX1.5 (NEEDLE) ×3 IMPLANT
NS IRRIG 500ML POUR BTL (IV SOLUTION) ×3 IMPLANT
PACK BASIN MINOR ARMC (MISCELLANEOUS) ×3 IMPLANT
PAD GROUND ADULT SPLIT (MISCELLANEOUS) ×3 IMPLANT
SHEARS FOC LG CVD HARMONIC 17C (MISCELLANEOUS) ×3 IMPLANT
STRIP CLOSURE SKIN 1/2X4 (GAUZE/BANDAGES/DRESSINGS) ×2 IMPLANT
SUT VIC AB 2-0 CT1 27 (SUTURE) ×2
SUT VIC AB 2-0 CT1 TAPERPNT 27 (SUTURE) ×1 IMPLANT
SUT VIC AB 3-0 54X BRD REEL (SUTURE) ×1 IMPLANT
SUT VIC AB 3-0 BRD 54 (SUTURE) ×2
SUT VIC AB 4-0 PS2 18 (SUTURE) ×3 IMPLANT
SWABSTK COMLB BENZOIN TINCTURE (MISCELLANEOUS) ×3 IMPLANT
SYR CONTROL 10ML (SYRINGE) ×3 IMPLANT

## 2015-03-18 NOTE — Anesthesia Procedure Notes (Signed)
Procedure Name: LMA Insertion Date/Time: 03/18/2015 3:10 PM Performed by: Eliberto Ivory Pre-anesthesia Checklist: Patient identified, Patient being monitored, Timeout performed, Emergency Drugs available and Suction available Patient Re-evaluated:Patient Re-evaluated prior to inductionOxygen Delivery Method: Circle system utilized Preoxygenation: Pre-oxygenation with 100% oxygen Intubation Type: IV induction Ventilation: Mask ventilation without difficulty LMA: LMA inserted LMA Size: 3.0 Tube type: Oral Number of attempts: 1 Placement Confirmation: positive ETCO2 and breath sounds checked- equal and bilateral Tube secured with: Tape Dental Injury: Teeth and Oropharynx as per pre-operative assessment

## 2015-03-18 NOTE — Anesthesia Preprocedure Evaluation (Signed)
Anesthesia Evaluation  Patient identified by MRN, date of birth, ID band Patient awake    Reviewed: Allergy & Precautions, H&P , NPO status , Patient's Chart, lab work & pertinent test results, reviewed documented beta blocker date and time   History of Anesthesia Complications Negative for: history of anesthetic complications  Airway Mallampati: III  TM Distance: >3 FB Neck ROM: limited    Dental no notable dental hx. (+) Teeth Intact   Pulmonary neg pulmonary ROS, former smoker,  breath sounds clear to auscultation  Pulmonary exam normal       Cardiovascular Exercise Tolerance: Good hypertension, - angina+ CAD, + Past MI, + Cardiac Stents and +CHF - Orthopnea, - PND and - DOE Normal cardiovascular exam+ Valvular Problems/Murmurs Rhythm:regular Rate:Normal     Neuro/Psych  Headaches, negative psych ROS   GI/Hepatic Neg liver ROS, GERD-  Controlled,  Endo/Other  Hypothyroidism   Renal/GU negative Renal ROS  negative genitourinary   Musculoskeletal  (+) Arthritis -,   Abdominal   Peds  Hematology negative hematology ROS (+)   Anesthesia Other Findings Past Medical History:   Urinary incontinence                                         Restless leg syndrome                                        Chronic systolic heart failure                                 Comment:EF was 25-35% post MI but improved to 45-50% in              04/2013   Coronary artery disease                         08/20/12       Comment:ST elevation myocardial infarction with late               presentation and cardiogenic shock. Cardiac               catheterization showed occluded mid LAD and 95%              stenosis in proximal RCA. She had 2               drug-eluting stent placements to the mid LAD               and one drug-eluting stent placement to the               proximal RCA. Ejection fraction was 25%.   A-fib                                                           Comment:A. fib with RVR in the setting of myocardial               infarction. Converted to sinus rhythm with  amiodarone.   Hyperlipidemia                                               Hypertension                                                 Atrophic vaginitis                                           Insomnia                                                     Osteoarthritis                                               Urinary, incontinence, stress female                         History of gastritis                                         Migraine headache                                            Glaucoma                                                     GERD (gastroesophageal reflux disease)                       Polyposis of colon                                           Glaucoma                                                     Non Hodgkin's lymphoma                          2005           Comment:reoccurance 2007 and 2012   Myocardial infarction  08/18/12     Patient reports cardiac clearance for this procedure   Reproductive/Obstetrics negative OB ROS                             Anesthesia Physical Anesthesia Plan  ASA: III  Anesthesia Plan: General LMA   Post-op Pain Management:    Induction:   Airway Management Planned:   Additional Equipment:   Intra-op Plan:   Post-operative Plan:   Informed Consent: I have reviewed the patients History and Physical, chart, labs and discussed the procedure including the risks, benefits and alternatives for the proposed anesthesia with the patient or authorized representative who has indicated his/her understanding and acceptance.   Dental Advisory Given  Plan Discussed with: Anesthesiologist, CRNA and Surgeon  Anesthesia Plan Comments:         Anesthesia Quick Evaluation

## 2015-03-18 NOTE — H&P (Signed)
No change in clinical history or exam.  For left axillary node biopsy.

## 2015-03-18 NOTE — Op Note (Signed)
Preoperative diagnosis: Recurrent lymphoma.  Postoperative diagnosis: Same.  Operative procedure: Left axillary node biopsy.  Operating surgeon: Hervey Ard, M.D.  Anesthesia: Gen. by LMA, Marcaine 0.5% with 1-200,000 of epinephrine, 20 mL local infiltration.  Estimated blood loss: Less than 5 mL.  Clinical note: This [age] woman has had recurrent lymphadenopathy in formal node excision has been requested by her treating oncologist.  Operative note: With the patient of adequate general anesthesia and a small roll behind the left shoulder the left excellent chest was prepped with chlor prep and draped. A transverse incision in the axilla was made over the palpable node. Skin was incised sharply and the remaining dissection completed with electrocautery. The node was excised and sent fresh for pathology. This was approximately 3 cm in diameter. After assuring good hemostasis the wound was closed in layers with interrupted 2-0 Vicryls figure-of-eight sutures to the axillary envelope and a second layer to the adipose tissue. The skin was closed with a running 4-0 Vicryls subcuticular suture. Marcaine was infiltrated at the beginning the procedure for postoperative algesia.  In joint, Steri-Strips, Telfa and Tegaderm dressing was applied.  The patient tolerated the procedure well was taken to recovery in stable condition.

## 2015-03-18 NOTE — Transfer of Care (Signed)
Immediate Anesthesia Transfer of Care Note  Patient: Jeanne Pittman  Procedure(s) Performed: Procedure(s): AXILLARY LYMPH NODE BIOPSY (Left)  Patient Location: PACU  Anesthesia Type:General  Level of Consciousness: Alert, Awake, Oriented  Airway & Oxygen Therapy: Patient Spontanous Breathing  Post-op Assessment: Report given to RN  Post vital signs: Reviewed and stable  Last Vitals:  Filed Vitals:   03/18/15 1544  BP: 139/70  Pulse: 78  Temp: 36.4 C  Resp: 17    Complications: No apparent anesthesia complications

## 2015-03-18 NOTE — Discharge Instructions (Signed)

## 2015-03-18 NOTE — Anesthesia Postprocedure Evaluation (Signed)
  Anesthesia Post-op Note  Patient: Jeanne Pittman  Procedure(s) Performed: Procedure(s): AXILLARY LYMPH NODE BIOPSY (Left)  Anesthesia type:General LMA  Patient location: PACU  Post pain: Pain level controlled  Post assessment: Post-op Vital signs reviewed, Patient's Cardiovascular Status Stable, Respiratory Function Stable, Patent Airway and No signs of Nausea or vomiting  Post vital signs: Reviewed and stable  Last Vitals:  Filed Vitals:   03/18/15 1727  BP: 147/52  Pulse: 62  Temp:   Resp: 14    Level of consciousness: awake, alert  and patient cooperative  Complications: No apparent anesthesia complications

## 2015-03-19 ENCOUNTER — Encounter: Payer: Self-pay | Admitting: General Surgery

## 2015-03-24 ENCOUNTER — Ambulatory Visit (INDEPENDENT_AMBULATORY_CARE_PROVIDER_SITE_OTHER): Payer: Medicare Other | Admitting: General Surgery

## 2015-03-24 ENCOUNTER — Encounter: Payer: Self-pay | Admitting: General Surgery

## 2015-03-24 VITALS — BP 134/64 | HR 66 | Resp 14 | Ht 61.0 in | Wt 115.0 lb

## 2015-03-24 DIAGNOSIS — C8590 Non-Hodgkin lymphoma, unspecified, unspecified site: Secondary | ICD-10-CM

## 2015-03-24 LAB — SURGICAL PATHOLOGY

## 2015-03-24 NOTE — Patient Instructions (Signed)
May apply heat top the area for comfort. The patient is aware to call back for any questions or concerns.

## 2015-03-24 NOTE — Progress Notes (Signed)
Patient ID: HONI Pittman, female   DOB: June 02, 1935, 80 y.o.   MRN: [id number]  Chief Complaint  Patient presents with  . Routine Post Op    left axillary node biopsy    HPI Jeanne Pittman is a 80 y.o. female here today for her post op left axillary node biopsy done on 03/16/15. She states she is doing well.   HPI  Past Medical History  Diagnosis Date  . Urinary incontinence   . Restless leg syndrome   . Chronic systolic heart failure     EF was 25-35% post MI but improved to 45-50% in 04/2013  . Coronary artery disease 08/20/12    ST elevation myocardial infarction with late presentation and cardiogenic shock. Cardiac catheterization showed occluded mid LAD and 95% stenosis in proximal RCA. She had 2 drug-eluting stent placements to the mid LAD and one drug-eluting stent placement to the proximal RCA. Ejection fraction was 25%.  . A-fib     A. fib with RVR in the setting of myocardial infarction. Converted to sinus rhythm with amiodarone.  . Hyperlipidemia   . Hypertension   . Atrophic vaginitis   . Insomnia   . Osteoarthritis   . Urinary, incontinence, stress female   . History of gastritis   . Migraine headache   . Glaucoma   . GERD (gastroesophageal reflux disease)   . Polyposis of colon   . Glaucoma   . Non Hodgkin's lymphoma 2005    reoccurance 2007 and 2012  . Myocardial infarction 08/18/12    Past Surgical History  Procedure Laterality Date  . Cardiac catheterization  08/19/2012    [name]; [facility]  . Total abdominal hysterectomy w/ bilateral salpingoophorectomy    . Colonoscopy    . Abdominal hysterectomy  1956  . Lymph node biopsy Right 07-24-11    Dr Bary Castilla  . Coronary angioplasty with stent placement      x3 stents  . Appendectomy    . Axillary lymph node biopsy Left 03/18/2015    Procedure: AXILLARY LYMPH NODE BIOPSY;  Surgeon: [name], MD;  Location: [name];  Service: General;  Laterality: Left;    Family History  Problem Relation Age of  Onset  . Heart attack Father   . Heart disease Brother     Social History History  Substance Use Topics  . Smoking status: Never Smoker   . Smokeless tobacco: Never Used  . Alcohol Use: No    Allergies  Allergen Reactions  . Ace Inhibitors   . Benadryl [Diphenhydramine Hcl]   . Codeine   . Hydrocodone     Current Outpatient Prescriptions  Medication Sig Dispense Refill  . acetaminophen (TYLENOL) 650 MG CR tablet Take 650 mg by mouth every 8 (eight) hours as needed for pain.    [name] aspirin 81 MG tablet Take 81 mg by mouth every morning.     [name] atorvastatin (LIPITOR) 10 MG tablet Take 1 tablet (10 mg total) by mouth daily. (Patient taking differently: Take 10 mg by mouth daily at 3 pm. ) 30 tablet 5  . carvedilol (COREG) 3.125 MG tablet TAKE 1 TABLET BY MOUTH TWICE A DAY WITH FOOD 60 tablet 3  . Cholecalciferol (VITAMIN D-3) 5000 UNITS TABS Take 2,000 Int'l Units by mouth every morning.     . fexofenadine (ALLEGRA) 180 MG tablet Take 180 mg by mouth daily as needed.    . furosemide (LASIX) 20 MG tablet Take 1 tablet (20 mg total) by mouth daily  as needed. 30 tablet 3  . Ginkgo Biloba 60 MG TABS Take 1 tablet by mouth every morning.    . latanoprost (XALATAN) 0.005 % ophthalmic solution Place 1 drop into both eyes at bedtime.     [name] LORazepam (ATIVAN) 0.5 MG tablet Take 0.5 mg by mouth every 8 (eight) hours as needed for anxiety.     [name] LORazepam (ATIVAN) 1 MG tablet Take 1 tablet (1 mg total) by mouth once. Take 1 hour prior to MRI on 7/25 1 tablet 0  . losartan (COZAAR) 25 MG tablet Take 0.5 tablets (12.5 mg total) by mouth daily. (Patient taking differently: Take 12.5 mg by mouth daily after breakfast. ) 30 tablet 3  . Oxybutynin Chloride (DITROPAN PO) Take 1 tablet by mouth as needed.     . pantoprazole (PROTONIX) 40 MG tablet TAKE 1 TABLET BY MOUTH TWICE A DAY 60 tablet 3  . pregabalin (LYRICA) 50 MG capsule Take 50 mg by mouth at bedtime as needed (restless leg).     . timolol  (TIMOPTIC) 0.5 % ophthalmic solution Place 1 drop into both eyes 2 (two) times daily.      No current facility-administered medications for this visit.    Review of Systems Review of Systems  Constitutional: Negative.   Respiratory: Negative.   Cardiovascular: Negative.     Blood pressure 134/64, pulse 66, resp. rate 14, height 5\' 1"  (1.549 m), weight 115 lb (52.164 kg).  Physical Exam Physical Exam  Constitutional: She is oriented to person, place, and time. She appears well-developed and well-nourished.  Pulmonary/Chest:    Lymphadenopathy:  Steri strip intact left axilla, minimal seroma  Neurological: She is alert and oriented to person, place, and time.  Skin: Skin is warm and dry.  Psychiatric: Her behavior is normal.    Data Reviewed Final report is pending, low-grade lymphoma suspected on initial assessment.  Assessment    Doing well status post left axillary node biopsy.    Plan    Local heat application to help resolve residual soreness in the axilla has been encouraged.    Follow up as needed.   PCP:  Fulton Reek Dr Coralee Pesa 03/25/2015, 7:43 AM

## 2015-03-25 ENCOUNTER — Inpatient Hospital Stay: Payer: Medicare Other | Attending: Oncology | Admitting: Oncology

## 2015-03-25 VITALS — BP 135/68 | HR 62 | Temp 96.5°F | Wt 115.4 lb

## 2015-03-25 DIAGNOSIS — C8290 Follicular lymphoma, unspecified, unspecified site: Secondary | ICD-10-CM | POA: Insufficient documentation

## 2015-03-25 DIAGNOSIS — R32 Unspecified urinary incontinence: Secondary | ICD-10-CM | POA: Diagnosis not present

## 2015-03-25 DIAGNOSIS — I2510 Atherosclerotic heart disease of native coronary artery without angina pectoris: Secondary | ICD-10-CM | POA: Insufficient documentation

## 2015-03-25 DIAGNOSIS — Z7982 Long term (current) use of aspirin: Secondary | ICD-10-CM | POA: Insufficient documentation

## 2015-03-25 DIAGNOSIS — M4806 Spinal stenosis, lumbar region: Secondary | ICD-10-CM | POA: Insufficient documentation

## 2015-03-25 DIAGNOSIS — K5730 Diverticulosis of large intestine without perforation or abscess without bleeding: Secondary | ICD-10-CM | POA: Insufficient documentation

## 2015-03-25 DIAGNOSIS — M419 Scoliosis, unspecified: Secondary | ICD-10-CM | POA: Diagnosis not present

## 2015-03-25 DIAGNOSIS — M545 Low back pain: Secondary | ICD-10-CM | POA: Diagnosis not present

## 2015-03-25 DIAGNOSIS — K219 Gastro-esophageal reflux disease without esophagitis: Secondary | ICD-10-CM | POA: Insufficient documentation

## 2015-03-25 DIAGNOSIS — Z79899 Other long term (current) drug therapy: Secondary | ICD-10-CM | POA: Insufficient documentation

## 2015-03-25 DIAGNOSIS — Z86010 Personal history of colonic polyps: Secondary | ICD-10-CM | POA: Diagnosis not present

## 2015-03-25 DIAGNOSIS — G8929 Other chronic pain: Secondary | ICD-10-CM | POA: Diagnosis not present

## 2015-03-25 DIAGNOSIS — R531 Weakness: Secondary | ICD-10-CM | POA: Insufficient documentation

## 2015-03-25 DIAGNOSIS — G2581 Restless legs syndrome: Secondary | ICD-10-CM

## 2015-03-25 DIAGNOSIS — I4891 Unspecified atrial fibrillation: Secondary | ICD-10-CM

## 2015-03-25 DIAGNOSIS — I5022 Chronic systolic (congestive) heart failure: Secondary | ICD-10-CM | POA: Diagnosis not present

## 2015-03-25 DIAGNOSIS — M79662 Pain in left lower leg: Secondary | ICD-10-CM | POA: Diagnosis not present

## 2015-03-25 DIAGNOSIS — I252 Old myocardial infarction: Secondary | ICD-10-CM | POA: Insufficient documentation

## 2015-03-25 DIAGNOSIS — R599 Enlarged lymph nodes, unspecified: Secondary | ICD-10-CM | POA: Insufficient documentation

## 2015-03-25 DIAGNOSIS — M1990 Unspecified osteoarthritis, unspecified site: Secondary | ICD-10-CM | POA: Diagnosis not present

## 2015-03-25 DIAGNOSIS — I10 Essential (primary) hypertension: Secondary | ICD-10-CM | POA: Diagnosis not present

## 2015-03-25 DIAGNOSIS — E785 Hyperlipidemia, unspecified: Secondary | ICD-10-CM

## 2015-03-25 DIAGNOSIS — Z9221 Personal history of antineoplastic chemotherapy: Secondary | ICD-10-CM | POA: Diagnosis not present

## 2015-03-25 DIAGNOSIS — R5383 Other fatigue: Secondary | ICD-10-CM | POA: Diagnosis not present

## 2015-03-25 DIAGNOSIS — Z8669 Personal history of other diseases of the nervous system and sense organs: Secondary | ICD-10-CM

## 2015-03-25 DIAGNOSIS — C8590 Non-Hodgkin lymphoma, unspecified, unspecified site: Secondary | ICD-10-CM

## 2015-03-25 NOTE — Progress Notes (Signed)
Patient has Healthcare POA.  Never smoked.  Patient has sore lymph node under left arm she would like assessed by MD.

## 2015-04-03 ENCOUNTER — Encounter: Payer: Self-pay | Admitting: Oncology

## 2015-04-03 NOTE — Progress Notes (Signed)
[place] @ [facility] [phone]  Fax:([phone]) [place]: 12-Apr-1935  MR#: [id number]  BOF#:[id number]  Patient Care Team: [name], MD as PCP - General (Unknown Physician Specialty) [name], MD (General Surgery) Forest Gleason, MD (Oncology)  CHIEF COMPLAINT:  Chief Complaint  Patient presents with  . 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  Rituxan once a week started in January of 2013 3.  Status post Zevalin therapy in 2014 Patient had acute MI in December of 2014 Also had upper and lower endoscopy done in May of 2015     Lymphoma in remission    80 year old lady with low-grade lymphoma August, 2016 Biopsy from the left axillary lymph node was consistent with small lymphocytic lymphoma however other subgroup of low-grade malignancy cannot be ruled out.  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  Patient remains asymptomatic except for puffiness in the left axillary area Enlargement of lymph nodes. INTERVAL HISTORY: Jeanne Pittman came today further follow-up is noticed puffiness in the left axillary area. Patient also had a recent PET scan done.  Continues to feel somewhat weak and tired.  No nausea.  No vomiting.  No diarrhea.  No significant weight loss.  No night sweats.  No chills or fever. Patient is here for to discuss the results of the lymph node biopsy REVIEW OF SYSTEMS:   GENERAL:  Feels weak and tired  No fevers, sweats or weight loss. PERFORMANCE STATUS (ECOG):01 HEENT:  No visual changes, runny nose, sore throat, mouth sores or tenderness. Lungs: No  shortness of breath or cough.  No hemoptysis. Cardiac:  No chest pain, palpitations, orthopnea, or PND. GI:  No nausea, vomiting, diarrhea, constipation, melena or hematochezia. GU:  No urgency, frequency, dysuria, or hematuria. Musculoskeletal: Pain radiating down to left lower extremity.. Extremities:  No pain or swelling. Skin:  No rashes or skin changes. Neuro:  numbness or weakness, balance or coordination issues.  Occasional headache Endocrine:  No diabetes, thyroid issues, hot flashes or night sweats. Psych:  No mood changes, depression or anxiety. Pain:  No focal pain. Review of systems:  All other systems reviewed and found to be negative. As per HPI. Otherwise, a complete review of systems is negatve.  PAST MEDICAL HISTORY: Past Medical History  Diagnosis Date  . Urinary incontinence   . Restless leg syndrome   . Chronic systolic heart failure     EF was 25-35% post MI but improved to 45-50% in 04/2013  . Coronary artery disease 08/20/12    ST elevation myocardial infarction with late presentation and cardiogenic shock. Cardiac catheterization showed occluded mid LAD and 95% stenosis in proximal RCA. She had 2 drug-eluting stent placements to the mid LAD and one drug-eluting stent placement to the proximal RCA. Ejection fraction was 25%.  . A-fib     A. fib with RVR in the setting of myocardial infarction. Converted to sinus rhythm with amiodarone.  . Hyperlipidemia   . Hypertension   . Atrophic vaginitis   . Insomnia   . Osteoarthritis   . Urinary, incontinence, stress female   . History of gastritis   .  Migraine headache   . Glaucoma   . GERD (gastroesophageal reflux disease)   . Polyposis of colon   . Glaucoma   . Non Hodgkin's lymphoma 2005    reoccurance 2007 and 2012  . Myocardial infarction 08/18/12    PAST SURGICAL HISTORY: Past Surgical History  Procedure Laterality Date  . Cardiac catheterization  08/19/2012    [name]; [facility]  . Total abdominal  hysterectomy w/ bilateral salpingoophorectomy    . Colonoscopy    . Abdominal hysterectomy  1956  . Lymph node biopsy Right 07-24-11    Dr Bary Castilla  . Coronary angioplasty with stent placement      x3 stents  . Appendectomy    . Axillary lymph node biopsy Left 03/18/2015    Procedure: AXILLARY LYMPH NODE BIOPSY;  Surgeon: [name], MD;  Location: [name];  Service: General;  Laterality: Left;    FAMILY HISTORY Family History  Problem Relation Age of Onset  . Heart attack Father   . Heart disease Brother     ADVANCED DIRECTIVES:  Patient does have advance healthcare directive, Patient   does not desire to make any changes HEALTH MAINTENANCE: Social History  Substance Use Topics  . Smoking status: Never Smoker   . Smokeless tobacco: Never Used  . Alcohol Use: No      Allergies  Allergen Reactions  . Ace Inhibitors   . Benadryl [Diphenhydramine Hcl]   . Codeine   . Hydrocodone     Current Outpatient Prescriptions  Medication Sig Dispense Refill  . acetaminophen (TYLENOL) 650 MG CR tablet Take 650 mg by mouth every 8 (eight) hours as needed for pain.    [name] aspirin 81 MG tablet Take 81 mg by mouth every morning.     [name] atorvastatin (LIPITOR) 10 MG tablet Take 1 tablet (10 mg total) by mouth daily. (Patient taking differently: Take 10 mg by mouth daily at 3 pm. ) 30 tablet 5  . carvedilol (COREG) 3.125 MG tablet TAKE 1 TABLET BY MOUTH TWICE A DAY WITH FOOD 60 tablet 3  . Cholecalciferol (VITAMIN D-3) 5000 UNITS TABS Take 2,000 Int'l Units by mouth every morning.     . fexofenadine (ALLEGRA) 180 MG tablet Take 180 mg by mouth daily as needed.    . furosemide (LASIX) 20 MG tablet Take 1 tablet (20 mg total) by mouth daily as needed. 30 tablet 3  . Ginkgo Biloba 60 MG TABS Take 1 tablet by mouth every morning.    . latanoprost (XALATAN) 0.005 % ophthalmic solution Place 1 drop into both eyes at bedtime.     [name] LORazepam (ATIVAN) 0.5 MG tablet Take 0.5 mg by mouth every 8  (eight) hours as needed for anxiety.     [name] LORazepam (ATIVAN) 1 MG tablet Take 1 tablet (1 mg total) by mouth once. Take 1 hour prior to MRI on 7/25 1 tablet 0  . losartan (COZAAR) 25 MG tablet Take 0.5 tablets (12.5 mg total) by mouth daily. (Patient taking differently: Take 12.5 mg by mouth daily after breakfast. ) 30 tablet 3  . Oxybutynin Chloride (DITROPAN PO) Take 1 tablet by mouth as needed.     . pantoprazole (PROTONIX) 40 MG tablet TAKE 1 TABLET BY MOUTH TWICE A DAY 60 tablet 3  . pregabalin (LYRICA) 50 MG capsule Take 50 mg by mouth at bedtime as needed (restless leg).     . timolol (TIMOPTIC) 0.5 % ophthalmic solution Place 1 drop into both eyes 2 (two) times  daily.      No current facility-administered medications for this visit.    OBJECTIVE:  Filed Vitals:   03/25/15 1511  BP: 135/68  Pulse: 62  Temp: 96.5 F (35.8 C)     Body mass index is 21.81 kg/(m^2).    ECOG FS:1 - Symptomatic but completely ambulatory  PHYSICAL EXAM: GENERAL:  Well developed, well nourished, sitting comfortably in the exam room in no acute distress. MENTAL STATUS:  Alert and oriented to person, place and time. HEAD:  Normocephalic, atraumatic, face symmetric, no Cushingoid features.  ENT:  Oropharynx clear without lesion.  Tongue normal. Mucous membranes moist.  RESPIRATORY:  Clear to auscultation without rales, wheezes or rhonchi. CARDIOVASCULAR:  Regular rate and rhythm without murmur, rub or gallop.  ABDOMEN:  Soft, non-tender, with active bowel sounds, and no hepatosplenomegaly.  No masses. BACK:  No CVA tenderness.  No tenderness on percussion of the back or rib cage. SKIN:  No rashes, ulcers or lesions. EXTREMITIES: No edema, no skin discoloration or tenderness.  No palpable cords. LYMPH NODES: Patient had palpable left axillary lymph node.  Small palpable right axillary lymph node.  Small palpable inguinal lymph node.  Left axillary lymph node is approximately 2 cm NEUROLOGICAL:  Unremarkable. PSYCH:  Appropriate.   LAB RESULTS:  No visits with results within 2 Day(s) from this visit. Latest known visit with results is:  Admission on 03/18/2015, Discharged on 03/18/2015  Component Date Value Ref Range Status  . SURGICAL PATHOLOGY 03/18/2015    Final                   Value:Surgical Pathology CASE: [id number] PATIENT: Jeanne Pittman Surgical Pathology Report     SPECIMEN SUBMITTED: A. Lymph node, left, axillary, biopsy  CLINICAL HISTORY: None provided  PRE-OPERATIVE DIAGNOSIS: Lymphoma  POST-OPERATIVE DIAGNOSIS: Lymphoma     DIAGNOSIS: A. LYMPH NODE, LEFT AXILLA; EXCISIONAL BIOPSY: - LOW-GRADE B-CELL LYMPHOMA, IMMUNOPHENOTYPICALLY MOST CONSISTENT WITH CLL/SLL, SEE COMMENT.  Comment: Small B cell non-Hodgkin lymphoma is present, with predominantly diffuse pattern and rare residual follicles. Small monocytoid lymphocytes are seen focally.  Immunophenotyping by flow cytometry demonstrated a CD5-positive, CD23-positive clonal B cell population with dim expression of CD20, CD38, and kappa light chains. The abnormal cell population represented 52% of cells analyzed (essentially all of the B cells). No other clonal population was detected. There was no loss of, or aberrant expression of, the pan T cell antigens. See separa                         te report from [facility], accession no. [id number].  Immunohistochemistry was performed, and the neoplastic cells are negative for cyclin D1. Ki-67 is positive in fewer than 30% of cells. CD20 shows a predominantly diffuse staining pattern, with admixed vaguely nodular areas. CD3 stains background T cells. CD5 staining is weak and is generally similar to CD3 staining; the neoplastic cells are not highlighted by this stain. All controls stained appropriately. The pathology report and slides from the right axillary lymph node biopsy ([id number],  07/2011) were reviewed. The neoplastic lymphocytes at that time were negative for CD5. The node was small, and a diffuse component was not present.  In the current sample, there is no evidence of mantle cell lymphoma, follicular lymphoma, or diffuse large B cell lymphoma. The lymphoma is difficult to sub-classify. The differential includes CLL/SLL and nodal marginal zone lymphoma.  Although the nodal morphology and previous history are not typical, the immunophenotype (CD5 and CD23 positive, with dim CD20 and dim kappa) is most consistent with CLL/SLL.  NON-HODGKIN LYMPHOMA/LYMPHOID NEOPLASMS: Biopsy, Resection Non-Hodgkin Lymphoma/Lymphoid Neoplasms, Biopsy, Resection Cancer Case Summary SPECIMEN Specimen: Lymph node(s) Procedure:     Biopsy Tumor Site:    Lymph node(s) Specify Site(s):    left axilla TUMOR Histologic Type:    Mature B-Cell Neoplasms Chronic lymphocytic leukemia / small lymphocytic lymphoma ACCESSORY FINIDINGS Lymph-Vascular Invasion: Not identified SPECIAL STUDIES Immunophenotyping:  Performed, see separate report See report    GROSS DESCRIPTION: A. Labeled: Left axillary lymph node Tissue Fragment(s): 1 Measurement: 3.8 x 2.8 x 1.5 cm Comment: Pink red lymph node candidate covered by adipose tissue, which on sectioning has a pale tan fleshy appearance. A touch prep is made, and a portion of the specimen is sent for                          flow cytometry studies.  Representative section submitted in cassette(s): 1-5    Final Diagnosis performed by Bryan Lemma, MD.  Electronically signed 03/24/2015 11:40:46AM    The electronic signature indicates that the named Attending Pathologist has evaluated the specimen  Technical component performed at [facility], [address] [place], [place] [address] Lab: [phone] Dir: Darrick Penna. Evette Doffing, MD  Professional component performed at [facility], [facility], [place], [place], [place] [address] Lab: [phone] Dir: Dellia Nims. Rubinas, MD        STUDIES: Mr Lumbar Spine W Wo Contrast  03/15/2015   CLINICAL DATA:  Chronic low back pain extending into the left leg with tingling. Weakness in the left leg. Non-Hodgkin's lymphoma.  EXAM: MRI LUMBAR SPINE WITHOUT AND WITH CONTRAST; MRI SACRUM/SACROILIAC JOINTS WITHOUT AND WITH CONTRAST  TECHNIQUE: Multiplanar and multiecho pulse sequences of the lumbar spine were obtained without and with intravenous contrast.  Multiplanar multi sequence MR images through the pelvis and sacroiliac joints were obtained before and after IV contrast.  CONTRAST:  106m MULTIHANCE GADOBENATE DIMEGLUMINE 529 MG/ML IV SOLN  COMPARISON:  03/04/2015 PET-CT  FINDINGS: MR LUMBAR SPINE FINDINGS:  The lowest lumbar type non-rib-bearing vertebra is labeled as L5. The conus medullaris appears normal. Conus level: L1.  Dextroconvex lumbar scoliosis observed with mild rotary component.  Pathologic retroperitoneal/periaortic lymph nodes are present.  Marrow heterogeneity is present. Although this can be caused by marrow infiltrative processes, the most common causes include anemia, smoking, obesity, or advancing age. Degenerative endplate findings are present at L1-2 and L2-3 with loss of intervertebral disc height. Diffuse disc desiccation in the lumbar spine with some relative sparing of L5-S1.  Bilateral renal fluid signal intensity lesions favor cysts. No significant abnormal epidural enhancement.  Additional findings at individual levels are as follows:  T12-L1: Unremarkable.  L1-2: Mild bilateral foraminal stenosis and mild displacement of the right L1 nerve in the lateral extraforaminal space due to intervertebral spurring, disc bulge, and facet arthropathy.  L2-3: Mild left and borderline right foraminal stenosis and borderline central narrowing of the thecal sac due to intervertebral spurring, disc bulge, facet arthropathy, and a left  paracentral disc protrusion. The disc bulge mildly displaces the right L2 nerve in the lateral extraforaminal space.  L3-4: Mild to moderate left and mild right foraminal stenosis and borderline central narrowing of the thecal sac due to disc bulge, intervertebral spurring, and facet arthropathy.  L4-5: Mild right and borderline left foraminal stenosis  with mild bilateral subarticular lateral recess stenosis and borderline central narrowing of the thecal sac due to disc bulge, facet arthropathy, and intervertebral spurring.  L5-S1: Moderate right and mild left foraminal stenosis due to disc bulge, intervertebral spurring, and facet spurring.  MR SACRUM/SACROILIAC JOINTS FINDINGS:  Note is made of scattered pelvic adenopathy along the nodal chains. This was shown on recent PET-CT.  Sacroiliac joints normal. Aside from the underlying mild marrow heterogeneity, the sacrum and coccyx appear unremarkable, without significant abnormal marrow edema or abnormal marrow enhancement different from that in the remainder of the lumbar spine and pelvis.  Sigmoid colon diverticulosis.  IMPRESSION: 1. Pathologic retroperitoneal and pelvic adenopathy noted, as shown on recent PET-CT. 2. Lumbar spondylosis and degenerative disc disease, causing moderate impingement at L5-S1; mild to moderate impingement at L3-4; and mild impingement at L1-2, L2-3, and L4-5, as detailed above. 3. Sacroiliac joints normal. 4. Marrow heterogeneity is present. Although this can be caused by marrow infiltrative processes, the most common causes include anemia, smoking, obesity, or advancing age. The lack of elevated marrow activity on the PET-CT from 11 days ago argues against this being marrow infiltration by tumor.   Electronically Signed   By: Van Clines M.D.   On: 03/15/2015 16:09   Mr Sacrum/si Joints W Wo Contrast  03/15/2015   CLINICAL DATA:  Chronic low back pain extending into the left leg with tingling. Weakness in the left leg.  Non-Hodgkin's lymphoma.  EXAM: MRI LUMBAR SPINE WITHOUT AND WITH CONTRAST; MRI SACRUM/SACROILIAC JOINTS WITHOUT AND WITH CONTRAST  TECHNIQUE: Multiplanar and multiecho pulse sequences of the lumbar spine were obtained without and with intravenous contrast.  Multiplanar multi sequence MR images through the pelvis and sacroiliac joints were obtained before and after IV contrast.  CONTRAST:  34m MULTIHANCE GADOBENATE DIMEGLUMINE 529 MG/ML IV SOLN  COMPARISON:  03/04/2015 PET-CT  FINDINGS: MR LUMBAR SPINE FINDINGS:  The lowest lumbar type non-rib-bearing vertebra is labeled as L5. The conus medullaris appears normal. Conus level: L1.  Dextroconvex lumbar scoliosis observed with mild rotary component.  Pathologic retroperitoneal/periaortic lymph nodes are present.  Marrow heterogeneity is present. Although this can be caused by marrow infiltrative processes, the most common causes include anemia, smoking, obesity, or advancing age. Degenerative endplate findings are present at L1-2 and L2-3 with loss of intervertebral disc height. Diffuse disc desiccation in the lumbar spine with some relative sparing of L5-S1.  Bilateral renal fluid signal intensity lesions favor cysts. No significant abnormal epidural enhancement.  Additional findings at individual levels are as follows:  T12-L1: Unremarkable.  L1-2: Mild bilateral foraminal stenosis and mild displacement of the right L1 nerve in the lateral extraforaminal space due to intervertebral spurring, disc bulge, and facet arthropathy.  L2-3: Mild left and borderline right foraminal stenosis and borderline central narrowing of the thecal sac due to intervertebral spurring, disc bulge, facet arthropathy, and a left paracentral disc protrusion. The disc bulge mildly displaces the right L2 nerve in the lateral extraforaminal space.  L3-4: Mild to moderate left and mild right foraminal stenosis and borderline central narrowing of the thecal sac due to disc bulge, intervertebral  spurring, and facet arthropathy.  L4-5: Mild right and borderline left foraminal stenosis with mild bilateral subarticular lateral recess stenosis and borderline central narrowing of the thecal sac due to disc bulge, facet arthropathy, and intervertebral spurring.  L5-S1: Moderate right and mild left foraminal stenosis due to disc bulge, intervertebral spurring, and facet spurring.  MR SACRUM/SACROILIAC JOINTS FINDINGS:  Note is  made of scattered pelvic adenopathy along the nodal chains. This was shown on recent PET-CT.  Sacroiliac joints normal. Aside from the underlying mild marrow heterogeneity, the sacrum and coccyx appear unremarkable, without significant abnormal marrow edema or abnormal marrow enhancement different from that in the remainder of the lumbar spine and pelvis.  Sigmoid colon diverticulosis.  IMPRESSION: 1. Pathologic retroperitoneal and pelvic adenopathy noted, as shown on recent PET-CT. 2. Lumbar spondylosis and degenerative disc disease, causing moderate impingement at L5-S1; mild to moderate impingement at L3-4; and mild impingement at L1-2, L2-3, and L4-5, as detailed above. 3. Sacroiliac joints normal. 4. Marrow heterogeneity is present. Although this can be caused by marrow infiltrative processes, the most common causes include anemia, smoking, obesity, or advancing age. The lack of elevated marrow activity on the PET-CT from 11 days ago argues against this being marrow infiltration by tumor.   Electronically Signed   By: Van Clines M.D.   On: 03/15/2015 16:09    ASSESSMENT: Recurrent lymphoma with recent PET scan showing increase in the activity. Patient does have palpable left axillary lymph node Low back pain pain radiating down to left lower extremity may not be related to lymphoma.  Biopsy of the left axillary lymph node which has been progressively getting bigger shows small lymphocytic lymphoma. All previous pathology has been reviewed with pathologist.  Patient has a  low-grade B-cell malignancy which just changed the immunophenotyping character off and on MEDICAL DECISION MAKING:  Situation was discussed with cytopathologist Dr. Gregor Hams I discussed situation with patient and her son that presently patient is asymptomatic and we would follow this patient carefully without any intervention Down the road E patient's lymph node continues to get bigger than possibility of rituximab can be considered for symptomatic relief.  Patient may not need addition of chemotherapy like bendamustine unless very symptomatic Total duration of visit was35 minutes.  50% or more time was spent in counseling patient and family regarding prognosis and options of treatment and available resources  Patient expressed understanding and was in agreement with this plan. She also understands that She can call clinic at any time with any questions, concerns, or complaints.    No matching staging information was found for the patient.  Forest Gleason, MD   04/03/2015 4:47 PM

## 2015-04-12 ENCOUNTER — Other Ambulatory Visit: Payer: Self-pay | Admitting: *Deleted

## 2015-04-12 MED ORDER — LOSARTAN POTASSIUM 25 MG PO TABS: 12.5000 mg | ORAL_TABLET | Freq: Every day | ORAL | Status: DC

## 2015-04-22 ENCOUNTER — Encounter: Payer: Self-pay | Admitting: Oncology

## 2015-04-22 ENCOUNTER — Inpatient Hospital Stay: Payer: Medicare Other | Attending: Oncology

## 2015-04-22 ENCOUNTER — Inpatient Hospital Stay (HOSPITAL_BASED_OUTPATIENT_CLINIC_OR_DEPARTMENT_OTHER): Payer: Medicare Other | Admitting: Oncology

## 2015-04-22 VITALS — BP 130/70 | HR 67 | Temp 96.1°F | Wt 115.5 lb

## 2015-04-22 DIAGNOSIS — I2510 Atherosclerotic heart disease of native coronary artery without angina pectoris: Secondary | ICD-10-CM | POA: Diagnosis not present

## 2015-04-22 DIAGNOSIS — Z9221 Personal history of antineoplastic chemotherapy: Secondary | ICD-10-CM | POA: Insufficient documentation

## 2015-04-22 DIAGNOSIS — C8590 Non-Hodgkin lymphoma, unspecified, unspecified site: Secondary | ICD-10-CM

## 2015-04-22 DIAGNOSIS — E785 Hyperlipidemia, unspecified: Secondary | ICD-10-CM | POA: Diagnosis not present

## 2015-04-22 DIAGNOSIS — G4700 Insomnia, unspecified: Secondary | ICD-10-CM | POA: Diagnosis not present

## 2015-04-22 DIAGNOSIS — Z79899 Other long term (current) drug therapy: Secondary | ICD-10-CM

## 2015-04-22 DIAGNOSIS — Z86010 Personal history of colonic polyps: Secondary | ICD-10-CM | POA: Diagnosis not present

## 2015-04-22 DIAGNOSIS — I4891 Unspecified atrial fibrillation: Secondary | ICD-10-CM | POA: Insufficient documentation

## 2015-04-22 DIAGNOSIS — I252 Old myocardial infarction: Secondary | ICD-10-CM

## 2015-04-22 DIAGNOSIS — C9110 Chronic lymphocytic leukemia of B-cell type not having achieved remission: Secondary | ICD-10-CM | POA: Diagnosis present

## 2015-04-22 DIAGNOSIS — I10 Essential (primary) hypertension: Secondary | ICD-10-CM | POA: Diagnosis not present

## 2015-04-22 DIAGNOSIS — M1990 Unspecified osteoarthritis, unspecified site: Secondary | ICD-10-CM

## 2015-04-22 DIAGNOSIS — N952 Postmenopausal atrophic vaginitis: Secondary | ICD-10-CM | POA: Insufficient documentation

## 2015-04-22 DIAGNOSIS — Z8669 Personal history of other diseases of the nervous system and sense organs: Secondary | ICD-10-CM | POA: Insufficient documentation

## 2015-04-22 DIAGNOSIS — Z7982 Long term (current) use of aspirin: Secondary | ICD-10-CM

## 2015-04-22 DIAGNOSIS — K219 Gastro-esophageal reflux disease without esophagitis: Secondary | ICD-10-CM

## 2015-04-22 DIAGNOSIS — G2581 Restless legs syndrome: Secondary | ICD-10-CM | POA: Insufficient documentation

## 2015-04-22 DIAGNOSIS — R5383 Other fatigue: Secondary | ICD-10-CM | POA: Diagnosis not present

## 2015-04-22 DIAGNOSIS — R32 Unspecified urinary incontinence: Secondary | ICD-10-CM | POA: Diagnosis not present

## 2015-04-22 DIAGNOSIS — C859A Non-Hodgkin lymphoma, unspecified, in remission: Secondary | ICD-10-CM

## 2015-04-22 DIAGNOSIS — I5022 Chronic systolic (congestive) heart failure: Secondary | ICD-10-CM | POA: Insufficient documentation

## 2015-04-22 LAB — CBC WITH DIFFERENTIAL/PLATELET
BASOS ABS: 0.1 10*3/uL (ref 0–0.1)
Basophils Relative: 1 %
EOS ABS: 0.2 10*3/uL (ref 0–0.7)
Eosinophils Relative: 3 %
HCT: 37.9 % (ref 35.0–47.0)
Hemoglobin: 12.9 g/dL (ref 12.0–16.0)
LYMPHS PCT: 45 %
Lymphs Abs: 3.1 10*3/uL (ref 1.0–3.6)
MCH: 31.6 pg (ref 26.0–34.0)
MCHC: 34.1 g/dL (ref 32.0–36.0)
MCV: 92.7 fL (ref 80.0–100.0)
MONO ABS: 0.5 10*3/uL (ref 0.2–0.9)
Monocytes Relative: 8 %
Neutro Abs: 3 10*3/uL (ref 1.4–6.5)
Neutrophils Relative %: 43 %
PLATELETS: 159 10*3/uL (ref 150–440)
RBC: 4.09 MIL/uL (ref 3.80–5.20)
RDW: 13.5 % (ref 11.5–14.5)
WBC: 6.9 10*3/uL (ref 3.6–11.0)

## 2015-04-22 LAB — COMPREHENSIVE METABOLIC PANEL
ALT: 12 U/L — ABNORMAL LOW (ref 14–54)
AST: 15 U/L (ref 15–41)
Albumin: 4 g/dL (ref 3.5–5.0)
Alkaline Phosphatase: 60 U/L (ref 38–126)
Anion gap: 3 — ABNORMAL LOW (ref 5–15)
BUN: 21 mg/dL — AB (ref 6–20)
CHLORIDE: 108 mmol/L (ref 101–111)
CO2: 28 mmol/L (ref 22–32)
Calcium: 8.9 mg/dL (ref 8.9–10.3)
Creatinine, Ser: 0.89 mg/dL (ref 0.44–1.00)
Glucose, Bld: 98 mg/dL (ref 65–99)
Potassium: 3.6 mmol/L (ref 3.5–5.1)
SODIUM: 139 mmol/L (ref 135–145)
Total Bilirubin: 0.8 mg/dL (ref 0.3–1.2)
Total Protein: 6.6 g/dL (ref 6.5–8.1)

## 2015-04-22 NOTE — Progress Notes (Signed)
Patient does not have living will.  Never smoked. 

## 2015-04-23 ENCOUNTER — Encounter: Payer: Self-pay | Admitting: Oncology

## 2015-04-23 NOTE — Progress Notes (Signed)
[place] @ [facility] Telephone:([phone]) [phone]  Fax:(336) [place]: 28-Feb-1935  MR#: [id number]  WNU#:[id number]  Patient Care Team: [name], MD as PCP - General (Unknown Physician Specialty) [name], MD (General Surgery) Forest Gleason, MD (Oncology)  CHIEF COMPLAINT:  Chief Complaint  Patient presents with  . 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  Rituxan once a week started in January of 2013 3.  Status post Zevalin therapy in 2014 Patient had acute MI in December of 2014 Also had upper and lower endoscopy done in May of 2015     4.  Biopsy of the left axillary lymph node (July, 2016)  Surgical Pathology  CASE: [id number]  DIAGNOSIS:  A. LYMPH NODE, LEFT AXILLA; EXCISIONAL BIOPSY:  - LOW-GRADE B-CELL LYMPHOMA, IMMUNOPHENOTYPICALLY MOST CONSISTENT WITH  CLL/SLL, SEE COMMENT.            80 year old lady with low-grade lymphoma INTERVAL HISTORY: Mrs. Birchmeier came today further follow-up is noticed puffiness in the left axillary area. Patient also had a recent PET scan done.  Continues to feel somewhat weak and tired.  No nausea.  No vomiting.  No diarrhea.  No significant weight loss.  No night sweats.  No chills or fever. April 22, 2015 Patient is here for ongoing evaluation regarding low-grade lymphoma.  Recent biopsy is consistent with CLL.  Patient has gone through different forms of low-grade lymphoma including follicular lymphoma marginal zone lymphoma.  At present time feeling somewhat fatigued.  No chills.  No fever.  No nausea or vomiting.   REVIEW OF SYSTEMS:   GENERAL:  Feels weak and tired  No fevers, sweats  or weight loss. PERFORMANCE STATUS (ECOG):01 HEENT:  No visual changes, runny nose, sore throat, mouth sores or tenderness. Lungs: No shortness of breath or cough.  No hemoptysis. Cardiac:  No chest pain, palpitations, orthopnea, or PND. GI:  No nausea, vomiting, diarrhea, constipation, melena or hematochezia. GU:  No urgency, frequency, dysuria, or hematuria. Musculoskeletal: Pain radiating down to left lower extremity.. Extremities:  No pain or swelling. Skin:  No rashes or skin changes. Neuro:  numbness or weakness, balance or coordination issues.  Occasional headache Endocrine:  No diabetes, thyroid issues, hot flashes or night sweats. Psych:  No mood changes, depression or anxiety. Pain:  No focal pain. Review of systems:  All other systems reviewed and found to be negative. As per HPI. Otherwise, a complete review of systems is negatve.  PAST MEDICAL HISTORY: Past Medical History  Diagnosis Date  . Urinary incontinence   . Restless leg syndrome   . Chronic systolic heart failure     EF was 25-35% post MI but improved to 45-50% in 04/2013  . Coronary artery disease 08/20/12    ST elevation myocardial infarction with late presentation and cardiogenic shock. Cardiac catheterization showed occluded mid LAD and 95% stenosis in proximal RCA. She had 2 drug-eluting stent placements to the mid LAD and one drug-eluting stent placement to the proximal RCA. Ejection fraction was 25%.  . A-fib     A. fib with RVR in the setting of myocardial infarction. Converted to sinus rhythm with amiodarone.  . Hyperlipidemia   .  Hypertension   . Atrophic vaginitis   . Insomnia   . Osteoarthritis   . Urinary, incontinence, stress female   . History of gastritis   . Migraine headache   . Glaucoma   . GERD (gastroesophageal reflux disease)   . Polyposis of colon   . Glaucoma   . Non Hodgkin's lymphoma 2005    reoccurance 2007 and 2012  . Myocardial infarction 08/18/12    PAST SURGICAL  HISTORY: Past Surgical History  Procedure Laterality Date  . Cardiac catheterization  08/19/2012    [name]; [facility]  . Total abdominal hysterectomy w/ bilateral salpingoophorectomy    . Colonoscopy    . Abdominal hysterectomy  1956  . Lymph node biopsy Right 07-24-11    Dr Bary Castilla  . Coronary angioplasty with stent placement      x3 stents  . Appendectomy    . Axillary lymph node biopsy Left 03/18/2015    Procedure: AXILLARY LYMPH NODE BIOPSY;  Surgeon: [name], MD;  Location: [name];  Service: General;  Laterality: Left;    FAMILY HISTORY Family History  Problem Relation Age of Onset  . Heart attack Father   . Heart disease Brother     ADVANCED DIRECTIVES:  Patient does have advance healthcare directive, Patient   does not desire to make any changes HEALTH MAINTENANCE: Social History  Substance Use Topics  . Smoking status: Never Smoker   . Smokeless tobacco: Never Used  . Alcohol Use: No      Allergies  Allergen Reactions  . Ace Inhibitors   . Benadryl [Diphenhydramine Hcl]   . Codeine   . Hydrocodone     Current Outpatient Prescriptions  Medication Sig Dispense Refill  . acetaminophen (TYLENOL) 650 MG CR tablet Take 650 mg by mouth every 8 (eight) hours as needed for pain.    [name] aspirin 81 MG tablet Take 81 mg by mouth every morning.     [name] atorvastatin (LIPITOR) 10 MG tablet Take 1 tablet (10 mg total) by mouth daily. (Patient taking differently: Take 10 mg by mouth daily at 3 pm. ) 30 tablet 5  . carvedilol (COREG) 3.125 MG tablet TAKE 1 TABLET BY MOUTH TWICE A DAY WITH FOOD 60 tablet 3  . Cholecalciferol (VITAMIN D-3) 5000 UNITS TABS Take 2,000 Int'l Units by mouth every morning.     . fexofenadine (ALLEGRA) 180 MG tablet Take 180 mg by mouth daily as needed.    . furosemide (LASIX) 20 MG tablet Take 1 tablet (20 mg total) by mouth daily as needed. 30 tablet 3  . Ginkgo Biloba 60 MG TABS Take 1 tablet by mouth every morning.    . latanoprost (XALATAN)  0.005 % ophthalmic solution Place 1 drop into both eyes at bedtime.     [name] LORazepam (ATIVAN) 0.5 MG tablet Take 0.5 mg by mouth every 8 (eight) hours as needed for anxiety.     [name] LORazepam (ATIVAN) 1 MG tablet Take 1 tablet (1 mg total) by mouth once. Take 1 hour prior to MRI on 7/25 1 tablet 0  . losartan (COZAAR) 25 MG tablet Take 0.5 tablets (12.5 mg total) by mouth daily after breakfast. 30 tablet 3  . Oxybutynin Chloride (DITROPAN PO) Take 1 tablet by mouth as needed.     . pantoprazole (PROTONIX) 40 MG tablet TAKE 1 TABLET BY MOUTH TWICE A DAY 60 tablet 3  . pregabalin (LYRICA) 50 MG capsule Take 50 mg by mouth at bedtime as needed (restless leg).     [name]  timolol (TIMOPTIC) 0.5 % ophthalmic solution Place 1 drop into both eyes 2 (two) times daily.      No current facility-administered medications for this visit.    OBJECTIVE:  Filed Vitals:   04/22/15 1617  BP: 130/70  Pulse: 67  Temp: 96.1 F (35.6 C)     Body mass index is 21.83 kg/(m^2).    ECOG FS:1 - Symptomatic but completely ambulatory  PHYSICAL EXAM: GENERAL:  Well developed, well nourished, sitting comfortably in the exam room in no acute distress. MENTAL STATUS:  Alert and oriented to person, place and time. HEAD:  Normocephalic, atraumatic, face symmetric, no Cushingoid features.  ENT:  Oropharynx clear without lesion.  Tongue normal. Mucous membranes moist.  RESPIRATORY:  Clear to auscultation without rales, wheezes or rhonchi. CARDIOVASCULAR:  Regular rate and rhythm without murmur, rub or gallop.  ABDOMEN:  Soft, non-tender, with active bowel sounds, and no hepatosplenomegaly.  No masses. BACK:  No CVA tenderness.  No tenderness on percussion of the back or rib cage. SKIN:  No rashes, ulcers or lesions. EXTREMITIES: No edema, no skin discoloration or tenderness.  No palpable cords. LYMPH NODES: Patient had palpable left axillary lymph node.  Small palpable right axillary lymph node.  Small palpable inguinal lymph  node.  Left axillary lymph node is approximately 2 cm NEUROLOGICAL: Unremarkable. PSYCH:  Appropriate.   LAB RESULTS:  Appointment on 04/22/2015  Component Date Value Ref Range Status  . WBC 04/22/2015 6.9  3.6 - 11.0 K/uL Final  . RBC 04/22/2015 4.09  3.80 - 5.20 MIL/uL Final  . Hemoglobin 04/22/2015 12.9  12.0 - 16.0 g/dL Final  . HCT 04/22/2015 37.9  35.0 - 47.0 % Final  . MCV 04/22/2015 92.7  80.0 - 100.0 fL Final  . MCH 04/22/2015 31.6  26.0 - 34.0 pg Final  . MCHC 04/22/2015 34.1  32.0 - 36.0 g/dL Final  . RDW 04/22/2015 13.5  11.5 - 14.5 % Final  . Platelets 04/22/2015 159  150 - 440 K/uL Final  . Neutrophils Relative % 04/22/2015 43   Final  . Neutro Abs 04/22/2015 3.0  1.4 - 6.5 K/uL Final  . Lymphocytes Relative 04/22/2015 45   Final  . Lymphs Abs 04/22/2015 3.1  1.0 - 3.6 K/uL Final  . Monocytes Relative 04/22/2015 8   Final  . Monocytes Absolute 04/22/2015 0.5  0.2 - 0.9 K/uL Final  . Eosinophils Relative 04/22/2015 3   Final  . Eosinophils Absolute 04/22/2015 0.2  0 - 0.7 K/uL Final  . Basophils Relative 04/22/2015 1   Final  . Basophils Absolute 04/22/2015 0.1  0 - 0.1 K/uL Final  . Sodium 04/22/2015 139  135 - 145 mmol/L Final  . Potassium 04/22/2015 3.6  3.5 - 5.1 mmol/L Final  . Chloride 04/22/2015 108  101 - 111 mmol/L Final  . CO2 04/22/2015 28  22 - 32 mmol/L Final  . Glucose, Bld 04/22/2015 98  65 - 99 mg/dL Final  . BUN 04/22/2015 21* 6 - 20 mg/dL Final  . Creatinine, Ser 04/22/2015 0.89  0.44 - 1.00 mg/dL Final  . Calcium 04/22/2015 8.9  8.9 - 10.3 mg/dL Final  . Total Protein 04/22/2015 6.6  6.5 - 8.1 g/dL Final  . Albumin 04/22/2015 4.0  3.5 - 5.0 g/dL Final  . AST 04/22/2015 15  15 - 41 U/L Final  . ALT 04/22/2015 12* 14 - 54 U/L Final  . Alkaline Phosphatase 04/22/2015 60  38 - 126 U/L Final  . Total  Bilirubin 04/22/2015 0.8  0.3 - 1.2 mg/dL Final  . GFR calc non Af Amer 04/22/2015 >60  >60 mL/min Final  . GFR calc Af Amer 04/22/2015 >60  >60  mL/min Final   Comment: (NOTE) The eGFR has been calculated using the CKD EPI equation. This calculation has not been validated in all clinical situations. eGFR's persistently <60 mL/min signify possible Chronic Kidney Disease.   . Anion gap 04/22/2015 3* 5 - 15 Final      STUDIES: No results found.  ASSESSMENT: Recurrent lymphoma with recent PET scan showing increase in the activity. Patient does have palpable left axillary lymph node Low back pain pain radiating down to left lower extremity may not be related to lymphoma.  MEDICAL DECISION MAKING:   Biopsies consistent with CLL-SLL At this point in time will continue to do observation.  The patient progresses then possibility of i  IBRUTANIB  can be considered.  Patient expressed understanding and was in agreement with this plan. She also understands that She can call clinic at any time with any questions, concerns, or complaints.    No matching staging information was found for the patient.  Forest Gleason, MD   04/23/2015 9:35 AM

## 2015-06-02 ENCOUNTER — Other Ambulatory Visit: Payer: Self-pay | Admitting: *Deleted

## 2015-06-02 MED ORDER — CARVEDILOL 3.125 MG PO TABS: 3.1250 mg | ORAL_TABLET | Freq: Two times a day (BID) | ORAL | Status: DC

## 2015-06-24 ENCOUNTER — Inpatient Hospital Stay: Payer: Medicare Other | Attending: Oncology

## 2015-06-24 ENCOUNTER — Inpatient Hospital Stay (HOSPITAL_BASED_OUTPATIENT_CLINIC_OR_DEPARTMENT_OTHER): Payer: Medicare Other | Admitting: Oncology

## 2015-06-24 ENCOUNTER — Encounter: Payer: Self-pay | Admitting: Oncology

## 2015-06-24 VITALS — BP 129/69 | HR 66 | Temp 96.6°F | Wt 116.2 lb

## 2015-06-24 DIAGNOSIS — G4700 Insomnia, unspecified: Secondary | ICD-10-CM | POA: Diagnosis not present

## 2015-06-24 DIAGNOSIS — Z8669 Personal history of other diseases of the nervous system and sense organs: Secondary | ICD-10-CM | POA: Insufficient documentation

## 2015-06-24 DIAGNOSIS — R32 Unspecified urinary incontinence: Secondary | ICD-10-CM | POA: Insufficient documentation

## 2015-06-24 DIAGNOSIS — Z9221 Personal history of antineoplastic chemotherapy: Secondary | ICD-10-CM | POA: Insufficient documentation

## 2015-06-24 DIAGNOSIS — N952 Postmenopausal atrophic vaginitis: Secondary | ICD-10-CM | POA: Diagnosis not present

## 2015-06-24 DIAGNOSIS — K219 Gastro-esophageal reflux disease without esophagitis: Secondary | ICD-10-CM | POA: Insufficient documentation

## 2015-06-24 DIAGNOSIS — C8590 Non-Hodgkin lymphoma, unspecified, unspecified site: Secondary | ICD-10-CM | POA: Diagnosis present

## 2015-06-24 DIAGNOSIS — M545 Low back pain: Secondary | ICD-10-CM

## 2015-06-24 DIAGNOSIS — Z86010 Personal history of colonic polyps: Secondary | ICD-10-CM | POA: Diagnosis not present

## 2015-06-24 DIAGNOSIS — E785 Hyperlipidemia, unspecified: Secondary | ICD-10-CM

## 2015-06-24 DIAGNOSIS — R5383 Other fatigue: Secondary | ICD-10-CM | POA: Diagnosis not present

## 2015-06-24 DIAGNOSIS — R599 Enlarged lymph nodes, unspecified: Secondary | ICD-10-CM | POA: Insufficient documentation

## 2015-06-24 DIAGNOSIS — Z79899 Other long term (current) drug therapy: Secondary | ICD-10-CM

## 2015-06-24 DIAGNOSIS — I10 Essential (primary) hypertension: Secondary | ICD-10-CM | POA: Diagnosis not present

## 2015-06-24 DIAGNOSIS — I4891 Unspecified atrial fibrillation: Secondary | ICD-10-CM | POA: Diagnosis not present

## 2015-06-24 DIAGNOSIS — I252 Old myocardial infarction: Secondary | ICD-10-CM | POA: Insufficient documentation

## 2015-06-24 DIAGNOSIS — G2581 Restless legs syndrome: Secondary | ICD-10-CM | POA: Insufficient documentation

## 2015-06-24 DIAGNOSIS — M1990 Unspecified osteoarthritis, unspecified site: Secondary | ICD-10-CM | POA: Diagnosis not present

## 2015-06-24 DIAGNOSIS — I5022 Chronic systolic (congestive) heart failure: Secondary | ICD-10-CM | POA: Insufficient documentation

## 2015-06-24 DIAGNOSIS — R531 Weakness: Secondary | ICD-10-CM | POA: Insufficient documentation

## 2015-06-24 DIAGNOSIS — I2510 Atherosclerotic heart disease of native coronary artery without angina pectoris: Secondary | ICD-10-CM | POA: Insufficient documentation

## 2015-06-24 DIAGNOSIS — Z7982 Long term (current) use of aspirin: Secondary | ICD-10-CM | POA: Insufficient documentation

## 2015-06-24 DIAGNOSIS — C859A Non-Hodgkin lymphoma, unspecified, in remission: Secondary | ICD-10-CM

## 2015-06-24 LAB — COMPREHENSIVE METABOLIC PANEL
ALT: 11 U/L — ABNORMAL LOW (ref 14–54)
AST: 20 U/L (ref 15–41)
Albumin: 4.1 g/dL (ref 3.5–5.0)
Alkaline Phosphatase: 59 U/L (ref 38–126)
Anion gap: 4 — ABNORMAL LOW (ref 5–15)
BUN: 21 mg/dL — ABNORMAL HIGH (ref 6–20)
CHLORIDE: 106 mmol/L (ref 101–111)
CO2: 28 mmol/L (ref 22–32)
Calcium: 8.4 mg/dL — ABNORMAL LOW (ref 8.9–10.3)
Creatinine, Ser: 0.98 mg/dL (ref 0.44–1.00)
GFR calc Af Amer: >60 mL/min (ref >60)
GFR calc non Af Amer: 54 mL/min — ABNORMAL LOW (ref >60)
GLUCOSE: 96 mg/dL (ref 65–99)
POTASSIUM: 3.9 mmol/L (ref 3.5–5.1)
SODIUM: 138 mmol/L (ref 135–145)
Total Bilirubin: 0.8 mg/dL (ref 0.3–1.2)
Total Protein: 6.5 g/dL (ref 6.5–8.1)

## 2015-06-24 LAB — CBC WITH DIFFERENTIAL/PLATELET
BASOS ABS: 0.1 10*3/uL (ref 0–0.1)
Basophils Relative: 1 %
Eosinophils Absolute: 0.2 10*3/uL (ref 0–0.7)
Eosinophils Relative: 3 %
HEMATOCRIT: 39.4 % (ref 35.0–47.0)
Hemoglobin: 13.4 g/dL (ref 12.0–16.0)
Lymphocytes Relative: 48 %
Lymphs Abs: 3 10*3/uL (ref 1.0–3.6)
MCH: 31.6 pg (ref 26.0–34.0)
MCHC: 33.9 g/dL (ref 32.0–36.0)
MCV: 93 fL (ref 80.0–100.0)
Monocytes Absolute: 0.5 10*3/uL (ref 0.2–0.9)
Monocytes Relative: 8 %
NEUTROS ABS: 2.5 10*3/uL (ref 1.4–6.5)
Neutrophils Relative %: 40 %
Platelets: 141 10*3/uL — ABNORMAL LOW (ref 150–440)
RBC: 4.23 MIL/uL (ref 3.80–5.20)
RDW: 13.9 % (ref 11.5–14.5)
WBC: 6.2 10*3/uL (ref 3.6–11.0)

## 2015-06-24 LAB — LACTATE DEHYDROGENASE: LDH: 126 U/L (ref 98–192)

## 2015-06-24 NOTE — Progress Notes (Signed)
Patient would like to get flu shot.

## 2015-06-25 ENCOUNTER — Encounter: Payer: Self-pay | Admitting: Oncology

## 2015-06-25 NOTE — Progress Notes (Signed)
[place] @ [facility] Telephone:(336) [phone]  Fax:(336) [place]: 03-Oct-1934  MR#: [id number]  CWU#:[id number]  Patient Care Team: [name], MD as PCP - General (Unknown Physician Specialty) [name], MD (General Surgery) Forest Gleason, MD (Oncology)  CHIEF COMPLAINT:  Chief Complaint  Patient presents with  . 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  Rituxan once a week started in January of 2013 3.  Status post Zevalin therapy i2010 No further therapy since 2013 Patient had acute MI in December of 2014 Also had upper and lower endoscopy done in May of 2015     4.  Biopsy of the left axillary lymph node (July, 2016)  Surgical Pathology  CASE: [id number]  DIAGNOSIS:  A. LYMPH NODE, LEFT AXILLA; EXCISIONAL BIOPSY:  - LOW-GRADE B-CELL LYMPHOMA, IMMUNOPHENOTYPICALLY MOST CONSISTENT WITH  CLL/SLL, SEE COMMENT.            80 year old lady with low-grade lymphoma INTERVAL HISTORY: Jeanne Pittman came today further follow-up is noticed puffiness in the left axillary area. Patient also had a recent PET scan done.  Continues to feel somewhat weak and tired.  No nausea.  No vomiting.  No diarrhea.  No significant weight loss.  No night sweats.  No chills or fever. April 22, 2015 Patient is here for ongoing evaluation regarding low-grade lymphoma.  Recent biopsy is consistent with CLL.  Patient has gone through different forms of low-grade lymphoma including follicular lymphoma marginal zone lymphoma.  At present time feeling somewhat fatigued.  No chills.  No fever.  No nausea or vomiting.   REVIEW OF SYSTEMS:   GENERAL:  Feels weak and  tired  No fevers, sweats or weight loss. PERFORMANCE STATUS (ECOG):01 HEENT:  No visual changes, runny nose, sore throat, mouth sores or tenderness. Lungs: No shortness of breath or cough.  No hemoptysis. Cardiac:  No chest pain, palpitations, orthopnea, or PND. GI:  No nausea, vomiting, diarrhea, constipation, melena or hematochezia. GU:  No urgency, frequency, dysuria, or hematuria. Musculoskeletal: Pain radiating down to left lower extremity.. Extremities:  No pain or swelling. Skin:  No rashes or skin changes. Neuro:  numbness or weakness, balance or coordination issues.  Occasional headache Endocrine:  No diabetes, thyroid issues, hot flashes or night sweats. Psych:  No mood changes, depression or anxiety. Pain:  No focal pain. Review of systems:  All other systems reviewed and found to be negative. As per HPI. Otherwise, a complete review of systems is negatve.  PAST MEDICAL HISTORY: Past Medical History  Diagnosis Date  . Urinary incontinence   . Restless leg syndrome   . Chronic systolic heart failure (HCC)     EF was 25-35% post MI but improved to 45-50% in 04/2013  . Coronary artery disease 08/20/12    ST elevation myocardial infarction with late presentation and cardiogenic shock. Cardiac catheterization showed occluded mid LAD and 95% stenosis in proximal RCA. She had 2 drug-eluting stent placements to the mid LAD and one drug-eluting stent placement to the proximal RCA. Ejection fraction was 25%.  . A-fib ([place])     A. fib with RVR in the setting of myocardial infarction. Converted to sinus rhythm with amiodarone.  [name]  Hyperlipidemia   . Hypertension   . Atrophic vaginitis   . Insomnia   . Osteoarthritis   . Urinary, incontinence, stress female   . History of gastritis   . Migraine headache   . Glaucoma   . GERD (gastroesophageal reflux disease)   . Polyposis of colon   . Glaucoma   . Non Hodgkin's lymphoma ([place]) 2005    reoccurance 2007 and 2012  . Myocardial  infarction [facility]) 08/18/12    PAST SURGICAL HISTORY: Past Surgical History  Procedure Laterality Date  . Cardiac catheterization  08/19/2012    [name]; [place]  . Total abdominal hysterectomy w/ bilateral salpingoophorectomy    . Colonoscopy    . Abdominal hysterectomy  1956  . Lymph node biopsy Right 07-24-11    Dr Bary Castilla  . Coronary angioplasty with stent placement      x3 stents  . Appendectomy    . Axillary lymph node biopsy Left 03/18/2015    Procedure: AXILLARY LYMPH NODE BIOPSY;  Surgeon: [name], MD;  Location: [name];  Service: General;  Laterality: Left;    FAMILY HISTORY Family History  Problem Relation Age of Onset  . Heart attack Father   . Heart disease Brother     ADVANCED DIRECTIVES:  Patient does have advance healthcare directive, Patient   does not desire to make any changes HEALTH MAINTENANCE: Social History  Substance Use Topics  . Smoking status: Never Smoker   . Smokeless tobacco: Never Used  . Alcohol Use: No      Allergies  Allergen Reactions  . Ace Inhibitors   . Benadryl [Diphenhydramine Hcl]   . Codeine   . Hydrocodone     Current Outpatient Prescriptions  Medication Sig Dispense Refill  . acetaminophen (TYLENOL) 650 MG CR tablet Take 650 mg by mouth every 8 (eight) hours as needed for pain.    [name] aspirin 81 MG tablet Take 81 mg by mouth every morning.     [name] atorvastatin (LIPITOR) 10 MG tablet Take 1 tablet (10 mg total) by mouth daily. (Patient taking differently: Take 10 mg by mouth daily at 3 pm. ) 30 tablet 5  . carvedilol (COREG) 3.125 MG tablet Take 1 tablet (3.125 mg total) by mouth 2 (two) times daily with a meal. 60 tablet 3  . Cholecalciferol (VITAMIN D-3) 5000 UNITS TABS Take 2,000 Int'l Units by mouth every morning.     . fexofenadine (ALLEGRA) 180 MG tablet Take 180 mg by mouth daily as needed.    . furosemide (LASIX) 20 MG tablet Take 1 tablet (20 mg total) by mouth daily as needed. 30 tablet 3  . Ginkgo Biloba 60 MG  TABS Take 1 tablet by mouth every morning.    . latanoprost (XALATAN) 0.005 % ophthalmic solution Place 1 drop into both eyes at bedtime.     [name] LORazepam (ATIVAN) 0.5 MG tablet Take 0.5 mg by mouth every 8 (eight) hours as needed for anxiety.     [name] LORazepam (ATIVAN) 1 MG tablet Take 1 tablet (1 mg total) by mouth once. Take 1 hour prior to MRI on 7/25 1 tablet 0  . losartan (COZAAR) 25 MG tablet Take 0.5 tablets (12.5 mg total) by mouth daily after breakfast. 30 tablet 3  . Oxybutynin Chloride (DITROPAN PO) Take 1 tablet by mouth as needed.     . pantoprazole (PROTONIX) 40 MG tablet TAKE 1 TABLET BY MOUTH TWICE A DAY 60 tablet 3  . pregabalin (LYRICA) 50 MG capsule  Take 50 mg by mouth at bedtime as needed (restless leg).     . timolol (TIMOPTIC) 0.5 % ophthalmic solution Place 1 drop into both eyes 2 (two) times daily.      No current facility-administered medications for this visit.    OBJECTIVE:  Filed Vitals:   06/24/15 1519  BP: 129/69  Pulse: 66  Temp: 96.6 F (35.9 C)     Body mass index is 21.96 kg/(m^2).    ECOG FS:1 - Symptomatic but completely ambulatory  PHYSICAL EXAM: GENERAL:  Well developed, well nourished, sitting comfortably in the exam room in no acute distress. MENTAL STATUS:  Alert and oriented to person, place and time. HEAD:  Normocephalic, atraumatic, face symmetric, no Cushingoid features.  ENT:  Oropharynx clear without lesion.  Tongue normal. Mucous membranes moist.  RESPIRATORY:  Clear to auscultation without rales, wheezes or rhonchi. CARDIOVASCULAR:  Regular rate and rhythm without murmur, rub or gallop.  ABDOMEN:  Soft, non-tender, with active bowel sounds, and no hepatosplenomegaly.  No masses. BACK:  No CVA tenderness.  No tenderness on percussion of the back or rib cage. SKIN:  No rashes, ulcers or lesions. EXTREMITIES: No edema, no skin discoloration or tenderness.  No palpable cords. LYMPH NODES: Patient had palpable left axillary lymph node.   Small palpable right axillary lymph node.  Small palpable inguinal lymph node.  Left axillary lymph node is approximately 2 cm NEUROLOGICAL: Unremarkable. PSYCH:  Appropriate.   LAB RESULTS:  Appointment on 06/24/2015  Component Date Value Ref Range Status  . WBC 06/24/2015 6.2  3.6 - 11.0 K/uL Final  . RBC 06/24/2015 4.23  3.80 - 5.20 MIL/uL Final  . Hemoglobin 06/24/2015 13.4  12.0 - 16.0 g/dL Final  . HCT 06/24/2015 39.4  35.0 - 47.0 % Final  . MCV 06/24/2015 93.0  80.0 - 100.0 fL Final  . MCH 06/24/2015 31.6  26.0 - 34.0 pg Final  . MCHC 06/24/2015 33.9  32.0 - 36.0 g/dL Final  . RDW 06/24/2015 13.9  11.5 - 14.5 % Final  . Platelets 06/24/2015 141* 150 - 440 K/uL Final  . Neutrophils Relative % 06/24/2015 40   Final  . Neutro Abs 06/24/2015 2.5  1.4 - 6.5 K/uL Final  . Lymphocytes Relative 06/24/2015 48   Final  . Lymphs Abs 06/24/2015 3.0  1.0 - 3.6 K/uL Final  . Monocytes Relative 06/24/2015 8   Final  . Monocytes Absolute 06/24/2015 0.5  0.2 - 0.9 K/uL Final  . Eosinophils Relative 06/24/2015 3   Final  . Eosinophils Absolute 06/24/2015 0.2  0 - 0.7 K/uL Final  . Basophils Relative 06/24/2015 1   Final  . Basophils Absolute 06/24/2015 0.1  0 - 0.1 K/uL Final  . Sodium 06/24/2015 138  135 - 145 mmol/L Final  . Potassium 06/24/2015 3.9  3.5 - 5.1 mmol/L Final  . Chloride 06/24/2015 106  101 - 111 mmol/L Final  . CO2 06/24/2015 28  22 - 32 mmol/L Final  . Glucose, Bld 06/24/2015 96  65 - 99 mg/dL Final  . BUN 06/24/2015 21* 6 - 20 mg/dL Final  . Creatinine, Ser 06/24/2015 0.98  0.44 - 1.00 mg/dL Final  . Calcium 06/24/2015 8.4* 8.9 - 10.3 mg/dL Final  . Total Protein 06/24/2015 6.5  6.5 - 8.1 g/dL Final  . Albumin 06/24/2015 4.1  3.5 - 5.0 g/dL Final  . AST 06/24/2015 20  15 - 41 U/L Final  . ALT 06/24/2015 11* 14 - 54 U/L Final  .  Alkaline Phosphatase 06/24/2015 59  38 - 126 U/L Final  . Total Bilirubin 06/24/2015 0.8  0.3 - 1.2 mg/dL Final  . GFR calc non Af Amer  06/24/2015 54* >60 mL/min Final  . GFR calc Af Amer 06/24/2015 >60  >60 mL/min Final   Comment: (NOTE) The eGFR has been calculated using the CKD EPI equation. This calculation has not been validated in all clinical situations. eGFR's persistently <60 mL/min signify possible Chronic Kidney Disease.   . Anion gap 06/24/2015 4* 5 - 15 Final  . LDH 06/24/2015 126  98 - 192 U/L Final      ASSESSMENT: Recurrent lymphoma with recent PET scan showing increase in the activity. Patient does have palpable left axillary lymph node Low back pain pain radiating down to left lower extremity may not be related to lymphoma.  MEDICAL DECISION MAKING:   Biopsies consistent with CLL-SLL There are small residual follicles present.  His CD5 positive CD20 positive Last PET scan was showing progressive disease but patient remains asymptomatic  PET scan in April or before if there is any lymph node enlargement. Patient is getting cardiac evaluation on ongoing basis At this point in time will continue to do observation.  The patient progresses then possibility of i  IBRUTANIB  can be considered.  Patient expressed understanding and was in agreement with this plan. She also understands that She can call clinic at any time with any questions, concerns, or complaints.    No matching staging information was found for the patient.  Forest Gleason, MD   06/25/2015 7:16 PM

## 2015-07-21 ENCOUNTER — Other Ambulatory Visit: Payer: Self-pay

## 2015-07-21 MED ORDER — LOSARTAN POTASSIUM 25 MG PO TABS: 12.5000 mg | ORAL_TABLET | Freq: Every day | ORAL | Status: DC

## 2015-07-22 ENCOUNTER — Ambulatory Visit (INDEPENDENT_AMBULATORY_CARE_PROVIDER_SITE_OTHER): Payer: Medicare Other | Admitting: Cardiovascular Disease

## 2015-07-22 ENCOUNTER — Encounter: Payer: Self-pay | Admitting: Cardiovascular Disease

## 2015-07-22 VITALS — BP 130/70 | HR 62 | Ht 61.0 in | Wt 118.0 lb

## 2015-07-22 DIAGNOSIS — I5022 Chronic systolic (congestive) heart failure: Secondary | ICD-10-CM | POA: Diagnosis not present

## 2015-07-22 DIAGNOSIS — E785 Hyperlipidemia, unspecified: Secondary | ICD-10-CM | POA: Diagnosis not present

## 2015-07-22 DIAGNOSIS — I2510 Atherosclerotic heart disease of native coronary artery without angina pectoris: Secondary | ICD-10-CM | POA: Diagnosis not present

## 2015-07-22 DIAGNOSIS — I480 Paroxysmal atrial fibrillation: Secondary | ICD-10-CM

## 2015-07-22 MED ORDER — ROSUVASTATIN CALCIUM 5 MG PO TABS: 5.0000 mg | ORAL_TABLET | Freq: Every day | ORAL | Status: DC

## 2015-07-22 NOTE — Patient Instructions (Addendum)
Medication Instructions:  Your physician has recommended you make the following change in your medication:  STOP atorvastatin START taking crestor 5mg  once per day.   Labwork: Liver and lipid profile in 6 weeks. Nothing to eat or drink after midnight the evening before your labs.   Testing/Procedures: none  Follow-Up: Your physician wants you to follow-up in: 6 months with Dr. Fletcher Anon.  You will receive a reminder letter in the mail two months in advance. If you don't receive a letter, please call our office to schedule the follow-up appointment.   Any Other Special Instructions Will Be Listed Below (If Applicable).     If you need a refill on your cardiac medications before your next appointment, please call your pharmacy.

## 2015-07-22 NOTE — Progress Notes (Signed)
Primary care physician: Dr. Doy Hutching  HPI  This is a pleasant 80 year old female who is here today for followup visit regarding coronary artery disease. She presented on August 17, 2012 with chest pain of 2 days' duration. She was noted to have anterior ST elevation with Q waves. She was in A. fib with RVR and was in cardiogenic shock. I performed emergent cardiac catheterization which showed an occluded mid LAD and 95% stenosis in the proximal RCA. 2 drug-eluting stents were placed in the mid LAD and 1 drug-eluting stent to the proximal RCA. Ejection fraction was 25-30% with akinesis of the mid distal anterior, apical and distal inferior wall. She had sluggish flow in the LAD post PCI likely due to late presentation. Most recent ejection fraction was 45-50% in September, 2014. She has known history of non-Hodgkin's lymphoma which is being observed. She has been doing well from a cardiac standpoint he denies any chest pain or palpitations. She needs to have eye surgery done to be able to raise her eyebrows. She complains of muscle aches with atorvastatin.   Allergies  Allergen Reactions  . Ace Inhibitors   . Benadryl [Diphenhydramine Hcl]   . Codeine   . Hydrocodone      Current Outpatient Prescriptions on File Prior to Visit  Medication Sig Dispense Refill  . acetaminophen (TYLENOL) 650 MG CR tablet Take 650 mg by mouth every 8 (eight) hours as needed for pain.    [name] aspirin 81 MG tablet Take 81 mg by mouth every morning.     [name] atorvastatin (LIPITOR) 10 MG tablet Take 1 tablet (10 mg total) by mouth daily. (Patient taking differently: Take 10 mg by mouth daily at 3 pm. ) 30 tablet 5  . carvedilol (COREG) 3.125 MG tablet Take 1 tablet (3.125 mg total) by mouth 2 (two) times daily with a meal. 60 tablet 3  . Cholecalciferol (VITAMIN D-3) 5000 UNITS TABS Take 2,000 Int'l Units by mouth every morning.     . fexofenadine (ALLEGRA) 180 MG tablet Take 180 mg by mouth daily as needed.    .  furosemide (LASIX) 20 MG tablet Take 1 tablet (20 mg total) by mouth daily as needed. 30 tablet 3  . Ginkgo Biloba 60 MG TABS Take 1 tablet by mouth every morning.    . latanoprost (XALATAN) 0.005 % ophthalmic solution Place 1 drop into both eyes at bedtime.     [name] LORazepam (ATIVAN) 0.5 MG tablet Take 0.5 mg by mouth every 8 (eight) hours as needed for anxiety.     [name] losartan (COZAAR) 25 MG tablet Take 0.5 tablets (12.5 mg total) by mouth daily after breakfast. 30 tablet 6  . Oxybutynin Chloride (DITROPAN PO) Take 1 tablet by mouth as needed.     . pantoprazole (PROTONIX) 40 MG tablet TAKE 1 TABLET BY MOUTH TWICE A DAY 60 tablet 3  . pregabalin (LYRICA) 50 MG capsule Take 50 mg by mouth at bedtime as needed (restless leg).     . timolol (TIMOPTIC) 0.5 % ophthalmic solution Place 1 drop into both eyes 2 (two) times daily.      No current facility-administered medications on file prior to visit.     Past Medical History  Diagnosis Date  . Urinary incontinence   . Restless leg syndrome   . Chronic systolic heart failure (HCC)     EF was 25-35% post MI but improved to 45-50% in 04/2013  . Coronary artery disease 08/20/12    ST elevation myocardial  infarction with late presentation and cardiogenic shock. Cardiac catheterization showed occluded mid LAD and 95% stenosis in proximal RCA. She had 2 drug-eluting stent placements to the mid LAD and one drug-eluting stent placement to the proximal RCA. Ejection fraction was 25%.  . A-fib ([place])     A. fib with RVR in the setting of myocardial infarction. Converted to sinus rhythm with amiodarone.  . Hyperlipidemia   . Hypertension   . Atrophic vaginitis   . Insomnia   . Osteoarthritis   . Urinary, incontinence, stress female   . History of gastritis   . Migraine headache   . Glaucoma   . GERD (gastroesophageal reflux disease)   . Polyposis of colon   . Glaucoma   . Non Hodgkin's lymphoma ([place]) 2005    reoccurance 2007 and 2012  . Myocardial  infarction [facility]) 08/18/12     Past Surgical History  Procedure Laterality Date  . Cardiac catheterization  08/19/2012    [name]; [facility]  . Total abdominal hysterectomy w/ bilateral salpingoophorectomy    . Colonoscopy    . Abdominal hysterectomy  1956  . Lymph node biopsy Right 07-24-11    Dr Bary Castilla  . Coronary angioplasty with stent placement      x3 stents  . Appendectomy    . Axillary lymph node biopsy Left 03/18/2015    Procedure: AXILLARY LYMPH NODE BIOPSY;  Surgeon: [name], MD;  Location: [name];  Service: General;  Laterality: Left;     Family History  Problem Relation Age of Onset  . Heart attack Father   . Heart disease Brother      Social History   Social History  . Marital Status: Divorced    Spouse Name: N/A  . Number of Children: N/A  . Years of Education: N/A   Occupational History  . Not on file.   Social History Main Topics  . Smoking status: Never Smoker   . Smokeless tobacco: Never Used  . Alcohol Use: No  . Drug Use: No  . Sexual Activity: Yes    Birth Control/ Protection: Surgical   Other Topics Concern  . Not on file   Social History Narrative     PHYSICAL EXAM   BP 130/70 mmHg  Pulse 62  Ht 5\' 1"  (1.549 m)  Wt 118 lb (53.524 kg)  BMI 22.31 kg/m2 Constitutional: She is oriented to person, place, and time. She appears well-developed and well-nourished. No distress.  HENT: No nasal discharge.  Head: Normocephalic and atraumatic.  Eyes: Pupils are equal and round. Right eye exhibits no discharge. Left eye exhibits no discharge.  Neck: Normal range of motion. Neck supple. No JVD present. No thyromegaly present.  Cardiovascular: Normal rate, regular rhythm, normal heart sounds. Exam reveals no gallop and no friction rub. No murmur heard.  Pulmonary/Chest: Effort normal and breath sounds normal. No stridor. No respiratory distress. She has no wheezes. She has no rales. She exhibits no tenderness.  Abdominal: Soft. Bowel  sounds are normal. She exhibits no distension. There is no tenderness. There is no rebound and no guarding.  Musculoskeletal: Normal range of motion. She exhibits no edema and no tenderness.  Neurological: She is alert and oriented to person, place, and time. Coordination normal.  Skin: Skin is warm and dry. Extensive bruising especially in the upper extremities. She is not diaphoretic. No erythema. No pallor.  Psychiatric: She has a normal mood and affect. Her behavior is normal. Judgment and thought content normal.  EKG: Normal sinus rhythm with old anterior infarct.  ASSESSMENT AND PLAN

## 2015-07-25 NOTE — Assessment & Plan Note (Signed)
She is doing well overall with no symptoms suggestive of angina. Continue medical therapy. She is no longer on Plavix. Continue lifelong treatment with low-dose aspirin. There is no contraindication for eye surgery from a cardiac standpoint. No ischemic workup is needed. Given that she had 2 previous drug-eluting stents, aspirin 81 mg once daily should be continued unless absolutely necessary to hold this.

## 2015-07-25 NOTE — Assessment & Plan Note (Signed)
There is no evidence of fluid overload and she takes furosemide only as needed. Continue low-dose losartan and carvedilol.

## 2015-07-25 NOTE — Assessment & Plan Note (Signed)
She reports myalgia with atorvastatin. Thus, I switched a small dose rosuvastatin. Repeat fasting lipid and liver profile in 6 weeks.

## 2015-07-28 ENCOUNTER — Other Ambulatory Visit: Payer: Self-pay

## 2015-07-28 MED ORDER — PANTOPRAZOLE SODIUM 40 MG PO TBEC: 40.0000 mg | DELAYED_RELEASE_TABLET | Freq: Two times a day (BID) | ORAL | Status: DC

## 2015-07-28 NOTE — Telephone Encounter (Signed)
Refill sent for pantoprazole 40 mg  

## 2015-08-26 ENCOUNTER — Telehealth: Payer: Self-pay

## 2015-08-26 NOTE — Telephone Encounter (Signed)
Coronary artery disease - [place] Hampshire, MD at 07/25/2015 12:59 PM     Status: Written Related Problem: Coronary artery disease   Expand All Collapse All   She is doing well overall with no symptoms suggestive of angina. Continue medical therapy. She is no longer on Plavix. Continue lifelong treatment with low-dose aspirin. There is no contraindication for eye surgery from a cardiac standpoint. No ischemic workup is needed. Given that she had 2 previous drug-eluting stents, aspirin 81 mg once daily should be continued unless absolutely necessary to hold this.        Left detailed message regarding 81mg  aspirin before surgery. Left call back number if questions. Instructed pt to have [place] contact us if need for cardiac clearance

## 2015-08-26 NOTE — Telephone Encounter (Signed)
This encounter was created in error - please disregard.

## 2015-08-26 NOTE — Telephone Encounter (Signed)
Pt called, states that at her last encounter her and Dr; Fletcher Anon discussed medications to hold for her eye surgery. States her surgery is in March. She would like a copy of the letter that was sent to Dr. Vickki Muff at [facility], instructing what medicines she should hold. Please call. States she will be away from 11-1. Okay to leave detailed msg.

## 2015-09-02 ENCOUNTER — Other Ambulatory Visit: Payer: Medicare Other

## 2015-09-02 DIAGNOSIS — E785 Hyperlipidemia, unspecified: Secondary | ICD-10-CM

## 2015-09-03 LAB — LIPID PANEL
Chol/HDL Ratio: 3.4 ratio units (ref 0.0–4.4)
Cholesterol, Total: 172 mg/dL (ref 100–199)
HDL: 51 mg/dL (ref >39)
LDL CALC: 99 mg/dL (ref 0–99)
Triglycerides: 111 mg/dL (ref 0–149)
VLDL Cholesterol Cal: 22 mg/dL (ref 5–40)

## 2015-09-03 LAB — HEPATIC FUNCTION PANEL
ALK PHOS: 70 IU/L (ref 39–117)
ALT: 11 IU/L (ref 0–32)
AST: 17 IU/L (ref 0–40)
Albumin: 4.2 g/dL (ref 3.5–4.8)
BILIRUBIN, DIRECT: 0.11 mg/dL (ref 0.00–0.40)
Bilirubin Total: 0.4 mg/dL (ref 0.0–1.2)
Total Protein: 6.3 g/dL (ref 6.0–8.5)

## 2015-09-06 ENCOUNTER — Other Ambulatory Visit: Payer: Self-pay

## 2015-09-06 MED ORDER — ROSUVASTATIN CALCIUM 10 MG PO TABS: 10.0000 mg | ORAL_TABLET | Freq: Every day | ORAL | Status: DC

## 2015-10-06 ENCOUNTER — Other Ambulatory Visit: Payer: Self-pay | Admitting: Internal Medicine

## 2015-10-06 DIAGNOSIS — M79604 Pain in right leg: Secondary | ICD-10-CM

## 2015-10-07 ENCOUNTER — Other Ambulatory Visit: Payer: Self-pay | Admitting: Internal Medicine

## 2015-10-07 DIAGNOSIS — Z1231 Encounter for screening mammogram for malignant neoplasm of breast: Secondary | ICD-10-CM

## 2015-10-08 ENCOUNTER — Ambulatory Visit
Admission: RE | Admit: 2015-10-08 | Discharge: 2015-10-08 | Disposition: A | Discharge status: Home / Self Care | Payer: Medicare Other | Source: Ambulatory Visit | Attending: Internal Medicine | Admitting: Internal Medicine

## 2015-10-08 DIAGNOSIS — R600 Localized edema: Secondary | ICD-10-CM | POA: Insufficient documentation

## 2015-10-08 DIAGNOSIS — M79604 Pain in right leg: Secondary | ICD-10-CM

## 2015-10-08 DIAGNOSIS — Z8572 Personal history of non-Hodgkin lymphomas: Secondary | ICD-10-CM | POA: Insufficient documentation

## 2015-10-18 ENCOUNTER — Other Ambulatory Visit: Payer: Self-pay | Admitting: *Deleted

## 2015-10-18 MED ORDER — CARVEDILOL 3.125 MG PO TABS: 3.1250 mg | ORAL_TABLET | Freq: Two times a day (BID) | ORAL | Status: DC

## 2015-10-25 ENCOUNTER — Ambulatory Visit
Admission: RE | Admit: 2015-10-25 | Discharge: 2015-10-25 | Disposition: A | Discharge status: Home / Self Care | Payer: Medicare Other | Source: Ambulatory Visit | Attending: Internal Medicine | Admitting: Internal Medicine

## 2015-10-25 ENCOUNTER — Other Ambulatory Visit: Payer: Self-pay | Admitting: Internal Medicine

## 2015-10-25 DIAGNOSIS — Z1231 Encounter for screening mammogram for malignant neoplasm of breast: Secondary | ICD-10-CM

## 2015-11-23 ENCOUNTER — Ambulatory Visit
Admission: RE | Admit: 2015-11-23 | Discharge: 2015-11-23 | Disposition: A | Discharge status: Home / Self Care | Payer: Medicare Other | Source: Ambulatory Visit | Attending: Oncology | Admitting: Oncology

## 2015-11-23 DIAGNOSIS — C8590 Non-Hodgkin lymphoma, unspecified, unspecified site: Secondary | ICD-10-CM

## 2015-11-23 DIAGNOSIS — R590 Localized enlarged lymph nodes: Secondary | ICD-10-CM | POA: Diagnosis not present

## 2015-11-23 DIAGNOSIS — I2510 Atherosclerotic heart disease of native coronary artery without angina pectoris: Secondary | ICD-10-CM | POA: Diagnosis not present

## 2015-11-23 DIAGNOSIS — Z8572 Personal history of non-Hodgkin lymphomas: Secondary | ICD-10-CM | POA: Diagnosis not present

## 2015-11-23 LAB — GLUCOSE, CAPILLARY: Glucose-Capillary: 79 mg/dL (ref 65–99)

## 2015-11-23 MED ORDER — FLUDEOXYGLUCOSE F - 18 (FDG) INJECTION
12.0800 | Freq: Once | INTRAVENOUS | Status: AC | PRN
  Administered 2015-11-23: 12.08 via INTRAVENOUS

## 2015-11-25 ENCOUNTER — Other Ambulatory Visit: Payer: Medicare Other

## 2015-11-25 ENCOUNTER — Ambulatory Visit: Payer: Medicare Other | Admitting: Oncology

## 2015-12-14 ENCOUNTER — Encounter: Payer: Self-pay | Admitting: Oncology

## 2015-12-14 ENCOUNTER — Inpatient Hospital Stay: Payer: Medicare Other | Attending: Oncology

## 2015-12-14 ENCOUNTER — Inpatient Hospital Stay (HOSPITAL_BASED_OUTPATIENT_CLINIC_OR_DEPARTMENT_OTHER): Payer: Medicare Other | Admitting: Oncology

## 2015-12-14 VITALS — BP 146/81 | HR 69 | Temp 95.9°F | Resp 18 | Wt 115.1 lb

## 2015-12-14 DIAGNOSIS — K219 Gastro-esophageal reflux disease without esophagitis: Secondary | ICD-10-CM | POA: Diagnosis not present

## 2015-12-14 DIAGNOSIS — Z8669 Personal history of other diseases of the nervous system and sense organs: Secondary | ICD-10-CM

## 2015-12-14 DIAGNOSIS — I4891 Unspecified atrial fibrillation: Secondary | ICD-10-CM | POA: Insufficient documentation

## 2015-12-14 DIAGNOSIS — E785 Hyperlipidemia, unspecified: Secondary | ICD-10-CM

## 2015-12-14 DIAGNOSIS — I10 Essential (primary) hypertension: Secondary | ICD-10-CM

## 2015-12-14 DIAGNOSIS — C8590 Non-Hodgkin lymphoma, unspecified, unspecified site: Secondary | ICD-10-CM

## 2015-12-14 DIAGNOSIS — I2510 Atherosclerotic heart disease of native coronary artery without angina pectoris: Secondary | ICD-10-CM | POA: Insufficient documentation

## 2015-12-14 DIAGNOSIS — R32 Unspecified urinary incontinence: Secondary | ICD-10-CM | POA: Insufficient documentation

## 2015-12-14 DIAGNOSIS — Z86010 Personal history of colonic polyps: Secondary | ICD-10-CM | POA: Insufficient documentation

## 2015-12-14 DIAGNOSIS — Z79899 Other long term (current) drug therapy: Secondary | ICD-10-CM

## 2015-12-14 DIAGNOSIS — I5022 Chronic systolic (congestive) heart failure: Secondary | ICD-10-CM | POA: Insufficient documentation

## 2015-12-14 DIAGNOSIS — N952 Postmenopausal atrophic vaginitis: Secondary | ICD-10-CM | POA: Diagnosis not present

## 2015-12-14 DIAGNOSIS — I252 Old myocardial infarction: Secondary | ICD-10-CM

## 2015-12-14 DIAGNOSIS — G2581 Restless legs syndrome: Secondary | ICD-10-CM | POA: Diagnosis not present

## 2015-12-14 DIAGNOSIS — Z9221 Personal history of antineoplastic chemotherapy: Secondary | ICD-10-CM

## 2015-12-14 DIAGNOSIS — Z806 Family history of leukemia: Secondary | ICD-10-CM | POA: Insufficient documentation

## 2015-12-14 DIAGNOSIS — Z7982 Long term (current) use of aspirin: Secondary | ICD-10-CM | POA: Diagnosis not present

## 2015-12-14 DIAGNOSIS — C9110 Chronic lymphocytic leukemia of B-cell type not having achieved remission: Secondary | ICD-10-CM

## 2015-12-14 DIAGNOSIS — M1990 Unspecified osteoarthritis, unspecified site: Secondary | ICD-10-CM

## 2015-12-14 DIAGNOSIS — G4700 Insomnia, unspecified: Secondary | ICD-10-CM | POA: Insufficient documentation

## 2015-12-14 LAB — CBC WITH DIFFERENTIAL/PLATELET
Basophils Absolute: 0.1 10*3/uL (ref 0–0.1)
Basophils Relative: 1 %
Eosinophils Absolute: 0.2 10*3/uL (ref 0–0.7)
Eosinophils Relative: 2 %
HEMATOCRIT: 38.7 % (ref 35.0–47.0)
HEMOGLOBIN: 13.4 g/dL (ref 12.0–16.0)
LYMPHS ABS: 3.2 10*3/uL (ref 1.0–3.6)
LYMPHS PCT: 50 %
MCH: 32.3 pg (ref 26.0–34.0)
MCHC: 34.7 g/dL (ref 32.0–36.0)
MCV: 93.1 fL (ref 80.0–100.0)
MONO ABS: 0.4 10*3/uL (ref 0.2–0.9)
MONOS PCT: 7 %
NEUTROS ABS: 2.5 10*3/uL (ref 1.4–6.5)
NEUTROS PCT: 40 %
Platelets: 133 10*3/uL — ABNORMAL LOW (ref 150–440)
RBC: 4.15 MIL/uL (ref 3.80–5.20)
RDW: 13.7 % (ref 11.5–14.5)
WBC: 6.3 10*3/uL (ref 3.6–11.0)

## 2015-12-14 LAB — COMPREHENSIVE METABOLIC PANEL
ALBUMIN: 4.3 g/dL (ref 3.5–5.0)
ALK PHOS: 55 U/L (ref 38–126)
ALT: 12 U/L — AB (ref 14–54)
AST: 16 U/L (ref 15–41)
Anion gap: 8 (ref 5–15)
BUN: 23 mg/dL — AB (ref 6–20)
CALCIUM: 9.4 mg/dL (ref 8.9–10.3)
CO2: 26 mmol/L (ref 22–32)
CREATININE: 0.88 mg/dL (ref 0.44–1.00)
Chloride: 105 mmol/L (ref 101–111)
GFR calc non Af Amer: >60 mL/min (ref >60)
GLUCOSE: 104 mg/dL — AB (ref 65–99)
Potassium: 3.9 mmol/L (ref 3.5–5.1)
Sodium: 139 mmol/L (ref 135–145)
Total Bilirubin: 0.7 mg/dL (ref 0.3–1.2)
Total Protein: 6.7 g/dL (ref 6.5–8.1)

## 2015-12-14 LAB — SEDIMENTATION RATE: Sed Rate: 8 mm/hr (ref 0–30)

## 2015-12-14 NOTE — Progress Notes (Signed)
Patient states she gets nauseated at times.  Says she is usually fatigued by mid day.

## 2015-12-14 NOTE — Progress Notes (Signed)
[place] @ [facility] [phone]  Fax:([phone]) [place]: 05-06-1935  MR#: [id number]  [id number]  Patient Care Team: [name], MD as PCP - General (Unknown Physician Specialty) [name], MD (General Surgery) Forest Gleason, MD (Oncology)  CHIEF COMPLAINT:  Chief Complaint  Patient presents with  . 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  Rituxan once a week started in January of 2013 3.  Status post Zevalin therapy i2010 No further therapy since 2013 Patient had acute MI in December of 2014 Also had upper and lower endoscopy done in May of 2015     4.  Biopsy of the left axillary lymph node (July, 2016)  Surgical Pathology  CASE: [id number]  DIAGNOSIS:  A. LYMPH NODE, LEFT AXILLA; EXCISIONAL BIOPSY:  - LOW-GRADE B-CELL LYMPHOMA, IMMUNOPHENOTYPICALLY MOST CONSISTENT WITH  CLL/SLL, SEE COMMENT.            81 year old lady with low-grade lymphoma INTERVAL HISTORY: Jeanne Pittman came today further follow-up is noticed puffiness in the left axillary area. Patient also had a recent PET scan done.  Continues to feel somewhat weak and tired.  No nausea.  No vomiting.  No diarrhea.  No significant weight loss.  No night sweats.  No chills or fever. SHE IS  THESE HERE FOR ONGOING EVALUATION REGARDING LOW-GRADE b-CELL LYMPHOMA Continues to feel somewhat weak and tired and fatigued.  No chills no fever  REVIEW OF SYSTEMS:   GENERAL:  Feels weak and tired  No fevers, sweats or weight loss. PERFORMANCE STATUS (ECOG):01 HEENT:  No visual changes, runny nose, sore throat, mouth sores or tenderness. Lungs: No shortness of  breath or cough.  No hemoptysis. Cardiac:  No chest pain, palpitations, orthopnea, or PND. GI:  No nausea, vomiting, diarrhea, constipation, melena or hematochezia. GU:  No urgency, frequency, dysuria, or hematuria. Musculoskeletal: Pain radiating down to left lower extremity.. Extremities:  No pain or swelling. Skin:  No rashes or skin changes. Neuro:  numbness or weakness, balance or coordination issues.  Occasional headache Endocrine:  No diabetes, thyroid issues, hot flashes or night sweats. Psych:  No mood changes, depression or anxiety. Pain:  No focal pain. Review of systems:  All other systems reviewed and found to be negative. As per HPI. Otherwise, a complete review of systems is negatve.  PAST MEDICAL HISTORY: Past Medical History  Diagnosis Date  . Urinary incontinence   . Restless leg syndrome   . Chronic systolic heart failure (HCC)     EF was 25-35% post MI but improved to 45-50% in 04/2013  . Coronary artery disease 08/20/12    ST elevation myocardial infarction with late presentation and cardiogenic shock. Cardiac catheterization showed occluded mid LAD and 95% stenosis in proximal RCA. She had 2 drug-eluting stent placements to the mid LAD and one drug-eluting stent placement to the proximal RCA. Ejection fraction was 25%.  . A-fib ([place])     A. fib with RVR in the setting of myocardial infarction. Converted to sinus rhythm with amiodarone.  . Hyperlipidemia   . Hypertension   . Atrophic vaginitis   . Insomnia   . Osteoarthritis   . Urinary, incontinence, stress female   .  History of gastritis   . Migraine headache   . Glaucoma   . GERD (gastroesophageal reflux disease)   . Polyposis of colon   . Glaucoma   . Non Hodgkin's lymphoma ([place]) 2005    reoccurance 2007 and 2012  . Myocardial infarction [facility]) 08/18/12    PAST SURGICAL HISTORY: Past Surgical History  Procedure Laterality Date  . Cardiac catheterization  08/19/2012    [name]; [facility]  . Total  abdominal hysterectomy w/ bilateral salpingoophorectomy    . Colonoscopy    . Abdominal hysterectomy  1956  . Lymph node biopsy Right 07-24-11    Dr Bary Castilla  . Coronary angioplasty with stent placement      x3 stents  . Appendectomy    . Axillary lymph node biopsy Left 03/18/2015    Procedure: AXILLARY LYMPH NODE BIOPSY;  Surgeon: [name], MD;  Location: [name];  Service: General;  Laterality: Left;    FAMILY HISTORY Family History  Problem Relation Age of Onset  . Heart attack Father   . Heart disease Brother   . Leukemia Mother     ADVANCED DIRECTIVES:  Patient does have advance healthcare directive, Patient   does not desire to make any changes HEALTH MAINTENANCE: Social History  Substance Use Topics  . Smoking status: Never Smoker   . Smokeless tobacco: Never Used  . Alcohol Use: No      Allergies  Allergen Reactions  . Ace Inhibitors   . Benadryl [Diphenhydramine Hcl]   . Codeine   . Hydrocodone     Current Outpatient Prescriptions  Medication Sig Dispense Refill  . acetaminophen (TYLENOL) 650 MG CR tablet Take 650 mg by mouth every 8 (eight) hours as needed for pain.    [name] aspirin 81 MG tablet Take 81 mg by mouth every morning.     . carvedilol (COREG) 3.125 MG tablet Take 1 tablet (3.125 mg total) by mouth 2 (two) times daily with a meal. 60 tablet 3  . Cholecalciferol (VITAMIN D-3) 5000 UNITS TABS Take 2,000 Int'l Units by mouth every morning.     . fexofenadine (ALLEGRA) 180 MG tablet Take 180 mg by mouth daily as needed.    . furosemide (LASIX) 20 MG tablet Take 1 tablet (20 mg total) by mouth daily as needed. 30 tablet 3  . Ginkgo Biloba 60 MG TABS Take 1 tablet by mouth every morning.    . latanoprost (XALATAN) 0.005 % ophthalmic solution Place 1 drop into both eyes at bedtime.     [name] LORazepam (ATIVAN) 0.5 MG tablet Take 0.5 mg by mouth every 8 (eight) hours as needed for anxiety.     [name] losartan (COZAAR) 25 MG tablet Take 0.5 tablets (12.5 mg  total) by mouth daily after breakfast. 30 tablet 6  . Oxybutynin Chloride (DITROPAN PO) Take 1 tablet by mouth as needed.     . pantoprazole (PROTONIX) 40 MG tablet Take 1 tablet (40 mg total) by mouth 2 (two) times daily. 60 tablet 3  . pregabalin (LYRICA) 50 MG capsule Take 50 mg by mouth at bedtime as needed (restless leg).     . rosuvastatin (CRESTOR) 10 MG tablet Take 1 tablet (10 mg total) by mouth daily. 30 tablet 5  . timolol (TIMOPTIC) 0.5 % ophthalmic solution Place 1 drop into both eyes 2 (two) times daily.      No current facility-administered medications for this visit.    OBJECTIVE:  Filed Vitals:   12/14/15 1521  BP: 146/81  Pulse: 69  Temp: 95.9 F (35.5 C)  Resp: 18     Body mass index is 21.76 kg/(m^2).    ECOG FS:1 - Symptomatic but completely ambulatory  PHYSICAL EXAM: GENERAL:  Well developed, well nourished, sitting comfortably in the exam room in no acute distress. MENTAL STATUS:  Alert and oriented to person, place and time. HEAD:  Normocephalic, atraumatic, face symmetric, no Cushingoid features.  ENT:  Oropharynx clear without lesion.  Tongue normal. Mucous membranes moist.  RESPIRATORY:  Clear to auscultation without rales, wheezes or rhonchi. CARDIOVASCULAR:  Regular rate and rhythm without murmur, rub or gallop.  ABDOMEN:  Soft, non-tender, with active bowel sounds, and no hepatosplenomegaly.  No masses. BACK:  No CVA tenderness.  No tenderness on percussion of the back or rib cage. SKIN:  No rashes, ulcers or lesions. EXTREMITIES: No edema, no skin discoloration or tenderness.  No palpable cords. LYMPH NODES: Patient had palpable left axillary lymph node.  Small palpable right axillary lymph node.  Small palpable inguinal lymph node.  Left axillary lymph node is approximately 2 cm NEUROLOGICAL: Unremarkable. PSYCH:  Appropriate.   LAB RESULTS:  Appointment on 12/14/2015  Component Date Value Ref Range Status  . WBC 12/14/2015 6.3  3.6 - 11.0  K/uL Final  . RBC 12/14/2015 4.15  3.80 - 5.20 MIL/uL Final  . Hemoglobin 12/14/2015 13.4  12.0 - 16.0 g/dL Final  . HCT 12/14/2015 38.7  35.0 - 47.0 % Final  . MCV 12/14/2015 93.1  80.0 - 100.0 fL Final  . MCH 12/14/2015 32.3  26.0 - 34.0 pg Final  . MCHC 12/14/2015 34.7  32.0 - 36.0 g/dL Final  . RDW 12/14/2015 13.7  11.5 - 14.5 % Final  . Platelets 12/14/2015 133* 150 - 440 K/uL Final  . Neutrophils Relative % 12/14/2015 40   Final  . Neutro Abs 12/14/2015 2.5  1.4 - 6.5 K/uL Final  . Lymphocytes Relative 12/14/2015 50   Final  . Lymphs Abs 12/14/2015 3.2  1.0 - 3.6 K/uL Final  . Monocytes Relative 12/14/2015 7   Final  . Monocytes Absolute 12/14/2015 0.4  0.2 - 0.9 K/uL Final  . Eosinophils Relative 12/14/2015 2   Final  . Eosinophils Absolute 12/14/2015 0.2  0 - 0.7 K/uL Final  . Basophils Relative 12/14/2015 1   Final  . Basophils Absolute 12/14/2015 0.1  0 - 0.1 K/uL Final   No abnormal osseous hypermetabolism.  IMPRESSION: 1. Stable size and FDG metabolism involving small to mildly enlarged lymph nodes in the left neck, axillary/subpectoral regions and retroperitoneum. 2. Coronary artery calcification.   ASSESSMENT: Recurrent lymphoma with recent PET scan showing increase in the activity. Patient does have palpable left axillary lymph node Low back pain pain radiating down to left lower extremity may not be related to lymphoma.  MEDICAL DECISION MAKING:   Biopsies consistent with CLL-SLL There are small residual follicles present.  His CD5 positive CD20 positive Pet SCAN HAS BEEN REVIEWED INDEPENDENTLY AND THERE IS NO EVIDENCE OF PROGRESSING ENLARGEMENT OF LYMPH NODES.  ALL LAB DATA HAS BEEN REVIEWED CONTINUE   OBSERVATION AND REPEAT pet SCAN IN 6 MONTHS OR BEFORE IF NEEDED pATIENT IS FULLY AWARE OF MY RETIREMENT PLAN AND MY ASSOCIATE WIL FOLLOW UP ON PATIENT.  sHE WAS ADVISED TO GIVE [place] A CALL IF   SHE  FIND ANY PALPABLE LYMPH NODE Patient is getting cardiac  evaluation on ongoing basis At this point in time will continue to do observation.  The patient  progresses then possibility of i  IBRUTANIB  can be considered.  Patient expressed understanding and was in agreement with this plan. She also understands that She can call clinic at any time with any questions, concerns, or complaints.    No matching staging information was found for the patient.  Forest Gleason, MD   12/14/2015 3:29 PM

## 2015-12-20 ENCOUNTER — Encounter: Payer: Self-pay | Admitting: Oncology

## 2015-12-28 ENCOUNTER — Ambulatory Visit (INDEPENDENT_AMBULATORY_CARE_PROVIDER_SITE_OTHER): Payer: Medicare Other | Admitting: Cardiovascular Disease

## 2015-12-28 ENCOUNTER — Encounter: Payer: Self-pay | Admitting: Cardiovascular Disease

## 2015-12-28 VITALS — BP 126/70 | HR 64 | Ht 62.0 in | Wt 116.5 lb

## 2015-12-28 DIAGNOSIS — I5022 Chronic systolic (congestive) heart failure: Secondary | ICD-10-CM

## 2015-12-28 DIAGNOSIS — I2510 Atherosclerotic heart disease of native coronary artery without angina pectoris: Secondary | ICD-10-CM | POA: Diagnosis not present

## 2015-12-28 DIAGNOSIS — I480 Paroxysmal atrial fibrillation: Secondary | ICD-10-CM

## 2015-12-28 NOTE — Patient Instructions (Signed)
Medication Instructions: Continue same medications.   Labwork: None.   Procedures/Testing: None.   Follow-Up: 6 months with Dr. Aubry Tucholski.   Any Additional Special Instructions Will Be Listed Below (If Applicable).     If you need a refill on your cardiac medications before your next appointment, please call your pharmacy.   

## 2015-12-28 NOTE — Progress Notes (Signed)
Cardiology Office Note   Date:  12/28/2015   ID:  Jeanne Pittman, DOB 1935-02-18, MRN [id number]  PCP:  Jeanne Crouch, MD  Cardiologist:   Kathlyn Sacramento, MD   Chief Complaint  Patient presents with  . other    6 month follow up. Meds reviewed by the patient verbally. Pt. c/o feeling tired more than usal.       History of Present Illness: Jeanne Pittman is a 81 y.o. female who presents for a followup visit regarding coronary artery disease. She presented on August 17, 2012 with chest pain of 2 days' duration. She was noted to have anterior ST elevation with Q waves. She was in A. fib with RVR and was in cardiogenic shock. I performed emergent cardiac catheterization which showed an occluded mid LAD and 95% stenosis in the proximal RCA. 2 drug-eluting stents were placed in the mid LAD and 1 drug-eluting stent to the proximal RCA. Ejection fraction was 25-30% with akinesis of the mid distal anterior, apical and distal inferior wall. She had sluggish flow in the LAD post PCI likely due to late presentation. Most recent ejection fraction was 45-50% in September, 2014. She has known history of non-Hodgkin's lymphoma which is being observed. She has been doing well from a cardiac standpoint he denies any chest pain or palpitations. She continues to complain of fatigue. During last visit, I switched her from atorvastatin to rosuvastatin due to myalgia. She is tolerating rosuvastatin with mild aching. The dose was increased from 5 to 10 mg.    Past Medical History  Diagnosis Date  . Urinary incontinence   . Restless leg syndrome   . Chronic systolic heart failure (HCC)     EF was 25-35% post MI but improved to 45-50% in 04/2013  . Coronary artery disease 08/20/12    ST elevation myocardial infarction with late presentation and cardiogenic shock. Cardiac catheterization showed occluded mid LAD and 95% stenosis in proximal RCA. She had 2 drug-eluting stent placements to the mid LAD and  one drug-eluting stent placement to the proximal RCA. Ejection fraction was 25%.  . A-fib ([place])     A. fib with RVR in the setting of myocardial infarction. Converted to sinus rhythm with amiodarone.  . Hyperlipidemia   . Hypertension   . Atrophic vaginitis   . Insomnia   . Osteoarthritis   . Urinary, incontinence, stress female   . History of gastritis   . Migraine headache   . Glaucoma   . GERD (gastroesophageal reflux disease)   . Polyposis of colon   . Glaucoma   . Non Hodgkin's lymphoma ([name]) 2005    reoccurance 2007 and 2012  . Myocardial infarction [facility]) 08/18/12    Past Surgical History  Procedure Laterality Date  . Cardiac catheterization  08/19/2012    [name]; Jeanne Pittman  . Total abdominal hysterectomy w/ bilateral salpingoophorectomy    . Colonoscopy    . Abdominal hysterectomy  1956  . Lymph node biopsy Right 07-24-11    Dr Jeanne Pittman  . Coronary angioplasty with stent placement      x3 stents  . Appendectomy    . Axillary lymph node biopsy Left 03/18/2015    Procedure: AXILLARY LYMPH NODE BIOPSY;  Surgeon: Jeanne Bellow, MD;  Location: [name];  Service: General;  Laterality: Left;     Current Outpatient Prescriptions  Medication Sig Dispense Refill  . acetaminophen (TYLENOL) 650 MG CR tablet Take 650 mg by mouth every 8 (eight) hours  as needed for pain.    [name] aspirin 81 MG tablet Take 81 mg by mouth every morning.     . carvedilol (COREG) 3.125 MG tablet Take 1 tablet (3.125 mg total) by mouth 2 (two) times daily with a meal. 60 tablet 3  . Cholecalciferol (VITAMIN D-3) 5000 UNITS TABS Take 2,000 Int'l Units by mouth every morning.     . fexofenadine (ALLEGRA) 180 MG tablet Take 180 mg by mouth daily as needed.    . furosemide (LASIX) 20 MG tablet Take 1 tablet (20 mg total) by mouth daily as needed. 30 tablet 3  . Ginkgo Biloba 60 MG TABS Take 1 tablet by mouth every morning.    . latanoprost (XALATAN) 0.005 % ophthalmic solution Place 1 drop into both eyes at  bedtime.     [name] LORazepam (ATIVAN) 0.5 MG tablet Take 0.5 mg by mouth every 8 (eight) hours as needed for anxiety.     [name] losartan (COZAAR) 25 MG tablet Take 0.5 tablets (12.5 mg total) by mouth daily after breakfast. 30 tablet 6  . Oxybutynin Chloride (DITROPAN PO) Take 1 tablet by mouth as needed.     . pantoprazole (PROTONIX) 40 MG tablet Take 1 tablet (40 mg total) by mouth 2 (two) times daily. 60 tablet 3  . pregabalin (LYRICA) 50 MG capsule Take 50 mg by mouth at bedtime as needed (restless leg).     . rosuvastatin (CRESTOR) 10 MG tablet Take 1 tablet (10 mg total) by mouth daily. 30 tablet 5  . timolol (TIMOPTIC) 0.5 % ophthalmic solution Place 1 drop into both eyes 2 (two) times daily.      No current facility-administered medications for this visit.    Allergies:   Ace inhibitors; Benadryl; Codeine; and Hydrocodone    Social History:  The patient  reports that she has never smoked. She has never used smokeless tobacco. She reports that she does not drink alcohol or use illicit drugs.   Family History:  The patient's family history includes Heart attack in her father; Heart disease in her brother; Leukemia in her mother.    ROS:  Please see the history of present illness.   Otherwise, review of systems are positive for none.   All other systems are reviewed and negative.    PHYSICAL EXAM: VS:  BP 126/70 mmHg  Pulse 64  Ht 5\' 2"  (1.575 m)  Wt 116 lb 8 oz (52.844 kg)  BMI 21.30 kg/m2 , BMI Body mass index is 21.3 kg/(m^2). GEN: Well nourished, well developed, in no acute distress HEENT: normal Neck: no JVD, carotid bruits, or masses Cardiac: RRR; no murmurs, rubs, or gallops,no edema  Respiratory:  clear to auscultation bilaterally, normal work of breathing GI: soft, nontender, nondistended, + BS MS: no deformity or atrophy Skin: warm and dry, no rash Neuro:  Strength and sensation are intact Psych: euthymic mood, full affect   EKG:  EKG is ordered today. The ekg ordered  today demonstrates normal sinus rhythm with possible old anterior infarct and no significant ST or T wave changes.   Recent Labs: 12/14/2015: ALT 12*; BUN 23*; Creatinine, Ser 0.88; Hemoglobin 13.4; Platelets 133*; Potassium 3.9; Sodium 139    Lipid Panel    Component Value Date/Time   CHOL 172 09/02/2015 0824   TRIG 111 09/02/2015 0824   HDL 51 09/02/2015 0824   CHOLHDL 3.4 09/02/2015 0824   LDLCALC 99 09/02/2015 0824      Wt Readings from Last 3 Encounters:  12/28/15 116 lb 8 oz (52.844 kg)  12/14/15 115 lb 1.3 oz (52.2 kg)  07/22/15 118 lb (53.524 kg)        ASSESSMENT AND PLAN:  1.  Coronary artery disease involving native coronary arteries without angina: She is overall doing well with no anginal symptoms. Continue medical therapy and low-dose aspirin.  2. Hyperlipidemia: She is tolerating rosuvastatin. Most recent LDL was 99. The dose was increased after that to 10 mg daily. Recent labs showed normal lipid profile.  3. Ischemic cardiomyopathy: Most recent ejection fraction was 45%. Continue low-dose carvedilol and losartan. I might consider a follow-up echocardiogram.  4. Fatigue: This is a nonspecific complaint. Her recent labs were unremarkable. Continue to follow-up with primary care physician.    Disposition:   FU with me in 6 months  Signed,  Kathlyn Sacramento, MD  12/28/2015 2:16 PM    [place]

## 2016-01-09 ENCOUNTER — Other Ambulatory Visit: Payer: Self-pay | Admitting: Cardiovascular Disease

## 2016-03-01 ENCOUNTER — Encounter: Payer: Self-pay | Admitting: *Deleted

## 2016-03-02 ENCOUNTER — Encounter: Payer: Self-pay | Admitting: *Deleted

## 2016-03-02 ENCOUNTER — Ambulatory Visit
Admission: RE | Admit: 2016-03-02 | Discharge: 2016-03-02 | Disposition: A | Discharge status: Home / Self Care | Payer: Medicare Other | Source: Ambulatory Visit | Attending: Unknown Physician Specialty | Admitting: Unknown Physician Specialty

## 2016-03-02 ENCOUNTER — Encounter
Admission: RE | Disposition: A | Discharge status: Home / Self Care | Payer: Self-pay | Source: Ambulatory Visit | Attending: Unknown Physician Specialty

## 2016-03-02 ENCOUNTER — Other Ambulatory Visit: Payer: Self-pay

## 2016-03-02 ENCOUNTER — Ambulatory Visit: Payer: Medicare Other | Admitting: Anesthesiology

## 2016-03-02 DIAGNOSIS — G43909 Migraine, unspecified, not intractable, without status migrainosus: Secondary | ICD-10-CM | POA: Insufficient documentation

## 2016-03-02 DIAGNOSIS — Z85828 Personal history of other malignant neoplasm of skin: Secondary | ICD-10-CM | POA: Diagnosis not present

## 2016-03-02 DIAGNOSIS — D120 Benign neoplasm of cecum: Secondary | ICD-10-CM | POA: Insufficient documentation

## 2016-03-02 DIAGNOSIS — R12 Heartburn: Secondary | ICD-10-CM | POA: Diagnosis not present

## 2016-03-02 DIAGNOSIS — Z7982 Long term (current) use of aspirin: Secondary | ICD-10-CM | POA: Insufficient documentation

## 2016-03-02 DIAGNOSIS — Z1211 Encounter for screening for malignant neoplasm of colon: Secondary | ICD-10-CM | POA: Insufficient documentation

## 2016-03-02 DIAGNOSIS — I4891 Unspecified atrial fibrillation: Secondary | ICD-10-CM | POA: Insufficient documentation

## 2016-03-02 DIAGNOSIS — I5022 Chronic systolic (congestive) heart failure: Secondary | ICD-10-CM | POA: Diagnosis not present

## 2016-03-02 DIAGNOSIS — I499 Cardiac arrhythmia, unspecified: Secondary | ICD-10-CM | POA: Insufficient documentation

## 2016-03-02 DIAGNOSIS — Z90710 Acquired absence of both cervix and uterus: Secondary | ICD-10-CM | POA: Insufficient documentation

## 2016-03-02 DIAGNOSIS — Z79899 Other long term (current) drug therapy: Secondary | ICD-10-CM | POA: Diagnosis not present

## 2016-03-02 DIAGNOSIS — K219 Gastro-esophageal reflux disease without esophagitis: Secondary | ICD-10-CM | POA: Diagnosis not present

## 2016-03-02 DIAGNOSIS — I252 Old myocardial infarction: Secondary | ICD-10-CM | POA: Diagnosis not present

## 2016-03-02 DIAGNOSIS — Z8572 Personal history of non-Hodgkin lymphomas: Secondary | ICD-10-CM | POA: Insufficient documentation

## 2016-03-02 DIAGNOSIS — E785 Hyperlipidemia, unspecified: Secondary | ICD-10-CM | POA: Insufficient documentation

## 2016-03-02 DIAGNOSIS — G629 Polyneuropathy, unspecified: Secondary | ICD-10-CM | POA: Insufficient documentation

## 2016-03-02 DIAGNOSIS — H409 Unspecified glaucoma: Secondary | ICD-10-CM | POA: Diagnosis not present

## 2016-03-02 DIAGNOSIS — M1990 Unspecified osteoarthritis, unspecified site: Secondary | ICD-10-CM | POA: Diagnosis not present

## 2016-03-02 DIAGNOSIS — Z885 Allergy status to narcotic agent status: Secondary | ICD-10-CM | POA: Insufficient documentation

## 2016-03-02 DIAGNOSIS — Z86010 Personal history of colonic polyps: Secondary | ICD-10-CM | POA: Diagnosis not present

## 2016-03-02 DIAGNOSIS — R32 Unspecified urinary incontinence: Secondary | ICD-10-CM | POA: Diagnosis not present

## 2016-03-02 DIAGNOSIS — I2510 Atherosclerotic heart disease of native coronary artery without angina pectoris: Secondary | ICD-10-CM | POA: Diagnosis not present

## 2016-03-02 DIAGNOSIS — N6019 Diffuse cystic mastopathy of unspecified breast: Secondary | ICD-10-CM | POA: Insufficient documentation

## 2016-03-02 DIAGNOSIS — Z955 Presence of coronary angioplasty implant and graft: Secondary | ICD-10-CM | POA: Insufficient documentation

## 2016-03-02 DIAGNOSIS — Z87891 Personal history of nicotine dependence: Secondary | ICD-10-CM | POA: Diagnosis not present

## 2016-03-02 DIAGNOSIS — Z888 Allergy status to other drugs, medicaments and biological substances status: Secondary | ICD-10-CM | POA: Insufficient documentation

## 2016-03-02 DIAGNOSIS — G4700 Insomnia, unspecified: Secondary | ICD-10-CM | POA: Diagnosis not present

## 2016-03-02 DIAGNOSIS — D122 Benign neoplasm of ascending colon: Secondary | ICD-10-CM | POA: Insufficient documentation

## 2016-03-02 DIAGNOSIS — D124 Benign neoplasm of descending colon: Secondary | ICD-10-CM | POA: Insufficient documentation

## 2016-03-02 DIAGNOSIS — G2581 Restless legs syndrome: Secondary | ICD-10-CM | POA: Diagnosis not present

## 2016-03-02 DIAGNOSIS — I110 Hypertensive heart disease with heart failure: Secondary | ICD-10-CM | POA: Insufficient documentation

## 2016-03-02 HISTORY — DX: Cardiac arrhythmia, unspecified: I49.9

## 2016-03-02 HISTORY — DX: Polyneuropathy, unspecified: G62.9

## 2016-03-02 HISTORY — PX: COLONOSCOPY WITH PROPOFOL: SHX5780

## 2016-03-02 HISTORY — PX: ESOPHAGOGASTRODUODENOSCOPY (EGD) WITH PROPOFOL: SHX5813

## 2016-03-02 SURGERY — COLONOSCOPY WITH PROPOFOL
Anesthesia: General

## 2016-03-02 MED ORDER — EPHEDRINE SULFATE 50 MG/ML IJ SOLN
INTRAMUSCULAR | Status: DC | PRN
  Administered 2016-03-02 (×2): 5 mg via INTRAVENOUS

## 2016-03-02 MED ORDER — PROPOFOL 500 MG/50ML IV EMUL
INTRAVENOUS | Status: DC | PRN
  Administered 2016-03-02: 100 ug/kg/min via INTRAVENOUS

## 2016-03-02 MED ORDER — SODIUM CHLORIDE 0.9 % IV SOLN
INTRAVENOUS | Status: DC
Start: 2016-03-02 — End: 2016-03-02

## 2016-03-02 MED ORDER — SODIUM CHLORIDE 0.9 % IV SOLN
INTRAVENOUS | Status: DC
  Administered 2016-03-02: 1000 mL via INTRAVENOUS

## 2016-03-02 MED ORDER — MIDAZOLAM HCL 2 MG/2ML IJ SOLN
INTRAMUSCULAR | Status: DC | PRN
  Administered 2016-03-02: 1 mg via INTRAVENOUS

## 2016-03-02 MED ORDER — CARVEDILOL 3.125 MG PO TABS: 3.1250 mg | ORAL_TABLET | Freq: Two times a day (BID) | ORAL | Status: DC

## 2016-03-02 MED ORDER — FENTANYL CITRATE (PF) 100 MCG/2ML IJ SOLN
INTRAMUSCULAR | Status: DC | PRN
  Administered 2016-03-02: 50 ug via INTRAVENOUS

## 2016-03-02 MED ORDER — LIDOCAINE HCL (CARDIAC) 20 MG/ML IV SOLN
INTRAVENOUS | Status: DC | PRN
  Administered 2016-03-02: 30 mg via INTRAVENOUS

## 2016-03-02 NOTE — Transfer of Care (Signed)
Immediate Anesthesia Transfer of Care Note  Patient: Jeanne Pittman  Procedure(s) Performed: Procedure(s): COLONOSCOPY WITH PROPOFOL (N/A) ESOPHAGOGASTRODUODENOSCOPY (EGD) WITH PROPOFOL (N/A)  Patient Location: Endoscopy Unit  Anesthesia Type:General  Level of Consciousness: awake, alert , oriented and patient cooperative  Airway & Oxygen Therapy: Patient Spontanous Breathing and Patient connected to nasal cannula oxygen  Post-op Assessment: Report given to RN, Post -op Vital signs reviewed and stable and Patient moving all extremities X 4  Post vital signs: Reviewed and stable  Last Vitals:  Filed Vitals:   03/02/16 1335  BP: 147/56  Pulse: 59  Temp: 36.4 C  Resp: 16    Last Pain: There were no vitals filed for this visit.       Complications: No apparent anesthesia complications

## 2016-03-02 NOTE — Telephone Encounter (Signed)
Refill sent for carvedilol 3.125 mg

## 2016-03-02 NOTE — H&P (Signed)
Primary Care Physician:  [name], MD Primary Gastroenterologist:  Dr. Vira Agar  Pre-Procedure History & Physical: HPI:  Jeanne Pittman is a 81 y.o. female is here for an endoscopy and colonoscopy.   Past Medical History  Diagnosis Date  . Urinary incontinence   . Restless leg syndrome   . Chronic systolic heart failure (HCC)     EF was 25-35% post MI but improved to 45-50% in 04/2013  . Coronary artery disease 08/20/12    ST elevation myocardial infarction with late presentation and cardiogenic shock. Cardiac catheterization showed occluded mid LAD and 95% stenosis in proximal RCA. She had 2 drug-eluting stent placements to the mid LAD and one drug-eluting stent placement to the proximal RCA. Ejection fraction was 25%.  . A-fib ([place])     A. fib with RVR in the setting of myocardial infarction. Converted to sinus rhythm with amiodarone.  . Hyperlipidemia   . Hypertension   . Atrophic vaginitis   . Insomnia   . Osteoarthritis   . Urinary, incontinence, stress female   . History of gastritis   . Migraine headache   . Glaucoma   . GERD (gastroesophageal reflux disease)   . Polyposis of colon   . Glaucoma   . Non Hodgkin's lymphoma ([place]) 2005    reoccurance 2007 and 2012  . Myocardial infarction ([place]) 08/18/12  . Dysrhythmia   . CHF (congestive heart failure) ([place])   . Fibrocystic breast disease   . Polyneuropathy [facility])     Past Surgical History  Procedure Laterality Date  . Cardiac catheterization  08/19/2012    [name]; [place]  . Total abdominal hysterectomy w/ bilateral salpingoophorectomy    . Colonoscopy    . Abdominal hysterectomy  1956  . Lymph node biopsy Right 07-24-11    Dr Bary Castilla  . Coronary angioplasty with stent placement      x3 stents  . Appendectomy    . Axillary lymph node biopsy Left 03/18/2015    Procedure: AXILLARY LYMPH NODE BIOPSY;  Surgeon: [name], MD;  Location: [name];  Service: General;  Laterality: Left;  . Ptca    .  Coronary artery bypass graft    . Skin cancer removal      Prior to Admission medications   Medication Sig Start Date End Date Taking? Authorizing Provider  aspirin 81 MG tablet Take 81 mg by mouth every morning.    Yes Historical Provider, MD  carvedilol (COREG) 3.125 MG tablet Take 1 tablet (3.125 mg total) by mouth 2 (two) times daily with a meal. 03/02/16  Yes Wellington Hampshire, MD  Cholecalciferol (VITAMIN D-3) 5000 UNITS TABS Take 2,000 Int'l Units by mouth every morning.    Yes Historical Provider, MD  fexofenadine (ALLEGRA) 180 MG tablet Take 180 mg by mouth daily as needed.   Yes Historical Provider, MD  furosemide (LASIX) 20 MG tablet Take 1 tablet (20 mg total) by mouth daily as needed. 05/29/14  Yes Wellington Hampshire, MD  Ginkgo Biloba 60 MG TABS Take 1 tablet by mouth every morning.   Yes Historical Provider, MD  latanoprost (XALATAN) 0.005 % ophthalmic solution Place 1 drop into both eyes at bedtime.  07/15/14  Yes Historical Provider, MD  LORazepam (ATIVAN) 0.5 MG tablet Take 0.5 mg by mouth every 8 (eight) hours as needed for anxiety.    Yes Historical Provider, MD  losartan (COZAAR) 25 MG tablet Take 0.5 tablets (12.5 mg total) by mouth daily after breakfast. 07/21/15  Yes Wellington Hampshire, MD  Oxybutynin Chloride (DITROPAN PO) Take 1 tablet by mouth as needed.    Yes Historical Provider, MD  pantoprazole (PROTONIX) 40 MG tablet TAKE 1 TABLET (40 MG TOTAL) BY MOUTH 2 (TWO) TIMES DAILY. 01/10/16  Yes Wellington Hampshire, MD  pregabalin (LYRICA) 50 MG capsule Take 50 mg by mouth at bedtime as needed (restless leg).    Yes Historical Provider, MD  rosuvastatin (CRESTOR) 10 MG tablet Take 1 tablet (10 mg total) by mouth daily. 09/06/15  Yes Wellington Hampshire, MD  timolol (TIMOPTIC) 0.5 % ophthalmic solution Place 1 drop into both eyes 2 (two) times daily.  06/15/14  Yes Historical Provider, MD  acetaminophen (TYLENOL) 650 MG CR tablet Take 650 mg by mouth every 8 (eight) hours as needed for  pain.    Historical Provider, MD    Allergies as of 02/04/2016 - Review Complete 12/28/2015  Allergen Reaction Noted  . Ace inhibitors  08/23/2012  . Benadryl [diphenhydramine hcl]  08/23/2012  . Codeine  08/23/2012  . Hydrocodone  08/23/2012    Family History  Problem Relation Age of Onset  . Heart attack Father   . Heart disease Brother   . Leukemia Mother     Social History   Social History  . Marital Status: Divorced    Spouse Name: N/A  . Number of Children: N/A  . Years of Education: N/A   Occupational History  . Not on file.   Social History Main Topics  . Smoking status: Former Smoker    Types: Cigarettes  . Smokeless tobacco: Never Used  . Alcohol Use: No  . Drug Use: No  . Sexual Activity: Yes    Birth Control/ Protection: Surgical   Other Topics Concern  . Not on file   Social History Narrative    Review of Systems: See HPI, otherwise negative ROS  Physical Exam: BP 147/56 mmHg  Pulse 59  Temp(Src) 97.5 F (36.4 C) (Tympanic)  Resp 16  Ht 5\' 1"  (1.549 m)  Wt 52.164 kg (115 lb)  BMI 21.74 kg/m2  SpO2 100% General:   Alert,  pleasant and cooperative in NAD Head:  Normocephalic and atraumatic. Neck:  Supple; no masses or thyromegaly. Lungs:  Clear throughout to auscultation.    Heart:  Regular rate and rhythm. Abdomen:  Soft, nontender and nondistended. Normal bowel sounds, without guarding, and without rebound.   Neurologic:  Alert and  oriented x4;  grossly normal neurologically.  Impression/Plan: Jeanne Pittman is here for an endoscopy and colonoscopy to be performed for Reflux, PH colon polyps  Risks, benefits, limitations, and alternatives regarding  endoscopy and colonoscopy have been reviewed with the patient.  Questions have been answered.  All parties agreeable.   Gaylyn Cheers, MD  03/02/2016, 2:09 PM

## 2016-03-02 NOTE — Op Note (Signed)
[facility] Gastroenterology Patient Name: Jeanne Pittman Procedure Date: 03/02/2016 2:11 PM MRN: [id number] Account #: [id number] Date of Birth: Jun 22, 1935 Admit Type: Outpatient Age: 81 Room: [facility] ENDO ROOM 4 Gender: Female Note Status: Finalized Procedure:            Colonoscopy Indications:          Personal history of colonic polyps Providers:            Manya Silvas, MD Referring MD:         Leonie Douglas. Doy Hutching, MD (Referring MD) Medicines:            Propofol per Anesthesia Complications:        No immediate complications. Procedure:            Pre-Anesthesia Assessment:                       - After reviewing the risks and benefits, the patient                        was deemed in satisfactory condition to undergo the                        procedure.                       After obtaining informed consent, the colonoscope was                        passed under direct vision. Throughout the procedure,                        the patient's blood pressure, pulse, and oxygen                        saturations were monitored continuously. The                        Colonoscope was introduced through the anus and                        advanced to the the cecum, identified by appendiceal                        orifice and ileocecal valve. The colonoscopy was                        somewhat difficult due to polyp on a turn. The patient                        tolerated the procedure well. The quality of the bowel                        preparation was excellent. Findings:      A 25 mm polyp was found in the distal ascending colon. The polyp was       sessile. The polyp was removed with a hot snare. It was removed by       pieces. Resection and retrieval were complete. Argon usd to cauterize       the site before clipping.      A diminutive polyp was found in the  cecum. The polyp was       semi-pedunculated. The polyp was removed with a jumbo cold forceps.    Resection and retrieval were complete.      A diminutive polyp was found in the descending colon. The polyp was       sessile. The polyp was removed with a jumbo cold forceps. Resection and       retrieval were complete.      A diminutive polyp was found in the cecum. The polyp was sessile. The       polyp was removed with a jumbo cold forceps. Resection and retrieval       were complete. Impression:           - One 25 mm polyp in the distal ascending colon,                        removed with a hot snare. Resected and retrieved.                       - One diminutive polyp in the cecum, removed with a                        jumbo cold forceps. Resected and retrieved.                       - One diminutive polyp in the descending colon, removed                        with a jumbo cold forceps. Resected and retrieved.                       - One diminutive polyp in the cecum, removed with a                        jumbo cold forceps. Resected and retrieved. Recommendation:       - Await pathology results. Manya Silvas, MD 03/02/2016 3:09:20 PM This report has been signed electronically. Number of Addenda: 0 Note Initiated On: 03/02/2016 2:11 PM Scope Withdrawal Time: 0 hours 32 minutes 40 seconds  Total Procedure Duration: 0 hours 41 minutes 0 seconds       [facility]

## 2016-03-02 NOTE — Anesthesia Preprocedure Evaluation (Signed)
Anesthesia Evaluation  Patient identified by MRN, date of birth, ID band Patient awake    Reviewed: Allergy & Precautions, NPO status , Patient's Chart, lab work & pertinent test results, reviewed documented beta blocker date and time   Airway Mallampati: II  TM Distance: >3 FB     Dental  (+) Chipped   Pulmonary former smoker,           Cardiovascular hypertension, Pt. on medications and Pt. on home beta blockers + CAD, + Past MI, + Cardiac Stents, + CABG and +CHF  + dysrhythmias Atrial Fibrillation      Neuro/Psych  Headaches,  Neuromuscular disease    GI/Hepatic GERD  Controlled,  Endo/Other  Hypothyroidism   Renal/GU      Musculoskeletal  (+) Arthritis ,   Abdominal   Peds  Hematology   Anesthesia Other Findings Stents X 2. Last EF 50%.  Reproductive/Obstetrics                             Anesthesia Physical Anesthesia Plan  ASA: III  Anesthesia Plan: General   Post-op Pain Management:    Induction: Intravenous  Airway Management Planned: Nasal Cannula  Additional Equipment:   Intra-op Plan:   Post-operative Plan:   Informed Consent: I have reviewed the patients History and Physical, chart, labs and discussed the procedure including the risks, benefits and alternatives for the proposed anesthesia with the patient or authorized representative who has indicated his/her understanding and acceptance.     Plan Discussed with: CRNA  Anesthesia Plan Comments:         Anesthesia Quick Evaluation

## 2016-03-02 NOTE — Anesthesia Postprocedure Evaluation (Signed)
Anesthesia Post Note  Patient: KAYONNA STEFANICH  Procedure(s) Performed: Procedure(s) (LRB): COLONOSCOPY WITH PROPOFOL (N/A) ESOPHAGOGASTRODUODENOSCOPY (EGD) WITH PROPOFOL (N/A)  Patient location during evaluation: Endoscopy Anesthesia Type: General Level of consciousness: awake and alert Pain management: pain level controlled Vital Signs Assessment: post-procedure vital signs reviewed and stable Respiratory status: spontaneous breathing, nonlabored ventilation, respiratory function stable and patient connected to nasal cannula oxygen Cardiovascular status: blood pressure returned to baseline and stable Postop Assessment: no signs of nausea or vomiting Anesthetic complications: no    Last Vitals:  Filed Vitals:   03/02/16 1550 03/02/16 1600  BP: 149/67 108/68  Pulse: 60 56  Temp:    Resp: 16 19    Last Pain: There were no vitals filed for this visit.               Joylyn Duggin S

## 2016-03-02 NOTE — Op Note (Signed)
[facility] Gastroenterology Patient Name: Jeanne Pittman Procedure Date: 03/02/2016 2:11 PM MRN: [id number] Account #: [id number] Date of Birth: 1935/04/08 Admit Type: Outpatient Age: 81 Room: [facility] ENDO ROOM 4 Gender: Female Note Status: Finalized Procedure:            Upper GI endoscopy Indications:          Heartburn Providers:            Manya Silvas, MD Referring MD:         Leonie Douglas. Doy Hutching, MD (Referring MD) Medicines:            Propofol per Anesthesia Complications:        No immediate complications. Procedure:            Pre-Anesthesia Assessment:                       - After reviewing the risks and benefits, the patient                        was deemed in satisfactory condition to undergo the                        procedure.                       After obtaining informed consent, the endoscope was                        passed under direct vision. Throughout the procedure,                        the patient's blood pressure, pulse, and oxygen                        saturations were monitored continuously. The Endoscope                        was introduced through the mouth, and advanced to the                        second part of duodenum. The upper GI endoscopy was                        accomplished without difficulty. The patient tolerated                        the procedure well. Findings:      The examined esophagus was normal.      The stomach was normal. Minimal eryhema      The examined duodenum was normal. Impression:           - Normal esophagus.                       - Normal stomach.                       - Normal examined duodenum.                       - No specimens collected. Recommendation:       - The findings and recommendations were discussed with  the patient's family. Manya Silvas, MD 03/02/2016 2:22:49 PM This report has been signed electronically. Number of Addenda: 0 Note Initiated On:  03/02/2016 2:11 PM      [facility]

## 2016-03-02 NOTE — Anesthesia Procedure Notes (Signed)
Performed by: COOK-MARTIN, Nyron Mozer Pre-anesthesia Checklist: Patient identified, Emergency Drugs available, Suction available, Patient being monitored and Timeout performed Patient Re-evaluated:Patient Re-evaluated prior to inductionOxygen Delivery Method: Nasal cannula Preoxygenation: Pre-oxygenation with 100% oxygen Intubation Type: IV induction Airway Equipment and Method: Bite block Placement Confirmation: positive ETCO2 and CO2 detector     

## 2016-03-03 ENCOUNTER — Other Ambulatory Visit: Payer: Self-pay

## 2016-03-03 MED ORDER — CARVEDILOL 3.125 MG PO TABS: 3.1250 mg | ORAL_TABLET | Freq: Two times a day (BID) | ORAL | Status: DC

## 2016-03-03 NOTE — Telephone Encounter (Signed)
Refill sent for Carvedilol 3.125 mg

## 2016-03-05 ENCOUNTER — Encounter: Payer: Self-pay | Admitting: Unknown Physician Specialty

## 2016-03-06 LAB — SURGICAL PATHOLOGY

## 2016-03-11 ENCOUNTER — Other Ambulatory Visit: Payer: Self-pay | Admitting: Cardiovascular Disease

## 2016-03-13 ENCOUNTER — Other Ambulatory Visit: Payer: Self-pay

## 2016-03-13 MED ORDER — CARVEDILOL 3.125 MG PO TABS: 3.1250 mg | ORAL_TABLET | Freq: Two times a day (BID) | ORAL | 3 refills | Status: DC

## 2016-03-14 ENCOUNTER — Other Ambulatory Visit: Payer: Self-pay | Admitting: *Deleted

## 2016-03-14 MED ORDER — CARVEDILOL 3.125 MG PO TABS
3.1250 mg | ORAL_TABLET | Freq: Two times a day (BID) | ORAL | 3 refills | Status: DC
Start: 2016-03-14 — End: 2016-08-28

## 2016-06-08 ENCOUNTER — Other Ambulatory Visit: Payer: Self-pay

## 2016-06-08 DIAGNOSIS — C859A Non-Hodgkin lymphoma, unspecified, in remission: Secondary | ICD-10-CM

## 2016-06-08 DIAGNOSIS — C8590 Non-Hodgkin lymphoma, unspecified, unspecified site: Secondary | ICD-10-CM

## 2016-06-14 ENCOUNTER — Inpatient Hospital Stay: Payer: Medicare Other | Admitting: Internal Medicine

## 2016-06-14 ENCOUNTER — Inpatient Hospital Stay: Payer: Medicare Other

## 2016-06-22 ENCOUNTER — Inpatient Hospital Stay: Payer: Medicare Other | Attending: Internal Medicine

## 2016-06-22 ENCOUNTER — Inpatient Hospital Stay (HOSPITAL_BASED_OUTPATIENT_CLINIC_OR_DEPARTMENT_OTHER): Payer: Medicare Other | Admitting: Internal Medicine

## 2016-06-22 VITALS — BP 158/79 | HR 67 | Temp 96.7°F | Resp 18 | Wt 117.0 lb

## 2016-06-22 DIAGNOSIS — Z8719 Personal history of other diseases of the digestive system: Secondary | ICD-10-CM

## 2016-06-22 DIAGNOSIS — Z7982 Long term (current) use of aspirin: Secondary | ICD-10-CM

## 2016-06-22 DIAGNOSIS — E785 Hyperlipidemia, unspecified: Secondary | ICD-10-CM

## 2016-06-22 DIAGNOSIS — I4891 Unspecified atrial fibrillation: Secondary | ICD-10-CM | POA: Insufficient documentation

## 2016-06-22 DIAGNOSIS — I10 Essential (primary) hypertension: Secondary | ICD-10-CM | POA: Diagnosis not present

## 2016-06-22 DIAGNOSIS — Z87891 Personal history of nicotine dependence: Secondary | ICD-10-CM | POA: Insufficient documentation

## 2016-06-22 DIAGNOSIS — C8384 Other non-follicular lymphoma, lymph nodes of axilla and upper limb: Secondary | ICD-10-CM

## 2016-06-22 DIAGNOSIS — I252 Old myocardial infarction: Secondary | ICD-10-CM | POA: Insufficient documentation

## 2016-06-22 DIAGNOSIS — I2510 Atherosclerotic heart disease of native coronary artery without angina pectoris: Secondary | ICD-10-CM

## 2016-06-22 DIAGNOSIS — Z8669 Personal history of other diseases of the nervous system and sense organs: Secondary | ICD-10-CM | POA: Insufficient documentation

## 2016-06-22 DIAGNOSIS — G2581 Restless legs syndrome: Secondary | ICD-10-CM

## 2016-06-22 DIAGNOSIS — Z9221 Personal history of antineoplastic chemotherapy: Secondary | ICD-10-CM | POA: Insufficient documentation

## 2016-06-22 DIAGNOSIS — Z79899 Other long term (current) drug therapy: Secondary | ICD-10-CM | POA: Insufficient documentation

## 2016-06-22 DIAGNOSIS — Z23 Encounter for immunization: Secondary | ICD-10-CM

## 2016-06-22 DIAGNOSIS — N6019 Diffuse cystic mastopathy of unspecified breast: Secondary | ICD-10-CM | POA: Diagnosis not present

## 2016-06-22 DIAGNOSIS — N952 Postmenopausal atrophic vaginitis: Secondary | ICD-10-CM | POA: Insufficient documentation

## 2016-06-22 DIAGNOSIS — M545 Low back pain: Secondary | ICD-10-CM | POA: Insufficient documentation

## 2016-06-22 DIAGNOSIS — I5022 Chronic systolic (congestive) heart failure: Secondary | ICD-10-CM | POA: Insufficient documentation

## 2016-06-22 DIAGNOSIS — M1990 Unspecified osteoarthritis, unspecified site: Secondary | ICD-10-CM | POA: Diagnosis not present

## 2016-06-22 DIAGNOSIS — G8929 Other chronic pain: Secondary | ICD-10-CM | POA: Diagnosis not present

## 2016-06-22 DIAGNOSIS — G4700 Insomnia, unspecified: Secondary | ICD-10-CM | POA: Insufficient documentation

## 2016-06-22 DIAGNOSIS — C8584 Other specified types of non-Hodgkin lymphoma, lymph nodes of axilla and upper limb: Secondary | ICD-10-CM

## 2016-06-22 DIAGNOSIS — Z86010 Personal history of colonic polyps: Secondary | ICD-10-CM | POA: Insufficient documentation

## 2016-06-22 DIAGNOSIS — K219 Gastro-esophageal reflux disease without esophagitis: Secondary | ICD-10-CM | POA: Insufficient documentation

## 2016-06-22 DIAGNOSIS — R32 Unspecified urinary incontinence: Secondary | ICD-10-CM | POA: Diagnosis not present

## 2016-06-22 DIAGNOSIS — G629 Polyneuropathy, unspecified: Secondary | ICD-10-CM | POA: Insufficient documentation

## 2016-06-22 DIAGNOSIS — C8304 Small cell B-cell lymphoma, lymph nodes of axilla and upper limb: Secondary | ICD-10-CM

## 2016-06-22 DIAGNOSIS — C8590 Non-Hodgkin lymphoma, unspecified, unspecified site: Secondary | ICD-10-CM

## 2016-06-22 LAB — COMPREHENSIVE METABOLIC PANEL
ALK PHOS: 59 U/L (ref 38–126)
ALT: 12 U/L — ABNORMAL LOW (ref 14–54)
ANION GAP: 5 (ref 5–15)
AST: 17 U/L (ref 15–41)
Albumin: 4.3 g/dL (ref 3.5–5.0)
BILIRUBIN TOTAL: 0.7 mg/dL (ref 0.3–1.2)
BUN: 23 mg/dL — ABNORMAL HIGH (ref 6–20)
CALCIUM: 9 mg/dL (ref 8.9–10.3)
CO2: 27 mmol/L (ref 22–32)
Chloride: 105 mmol/L (ref 101–111)
Creatinine, Ser: 0.78 mg/dL (ref 0.44–1.00)
Glucose, Bld: 105 mg/dL — ABNORMAL HIGH (ref 65–99)
POTASSIUM: 3.6 mmol/L (ref 3.5–5.1)
Sodium: 137 mmol/L (ref 135–145)
TOTAL PROTEIN: 6.7 g/dL (ref 6.5–8.1)

## 2016-06-22 LAB — SEDIMENTATION RATE: SED RATE: 7 mm/h (ref 0–30)

## 2016-06-22 LAB — CBC WITH DIFFERENTIAL/PLATELET
BASOS PCT: 1 %
Basophils Absolute: 0.1 10*3/uL (ref 0–0.1)
EOS ABS: 0.1 10*3/uL (ref 0–0.7)
Eosinophils Relative: 2 %
HEMATOCRIT: 37.7 % (ref 35.0–47.0)
HEMOGLOBIN: 13.1 g/dL (ref 12.0–16.0)
LYMPHS ABS: 3.5 10*3/uL (ref 1.0–3.6)
Lymphocytes Relative: 54 %
MCH: 32.5 pg (ref 26.0–34.0)
MCHC: 34.8 g/dL (ref 32.0–36.0)
MCV: 93.5 fL (ref 80.0–100.0)
MONO ABS: 0.5 10*3/uL (ref 0.2–0.9)
MONOS PCT: 8 %
NEUTROS ABS: 2.3 10*3/uL (ref 1.4–6.5)
NEUTROS PCT: 35 %
Platelets: 127 10*3/uL — ABNORMAL LOW (ref 150–440)
RBC: 4.03 MIL/uL (ref 3.80–5.20)
RDW: 13.5 % (ref 11.5–14.5)
WBC: 6.5 10*3/uL (ref 3.6–11.0)

## 2016-06-22 MED ORDER — INFLUENZA VAC SPLIT QUAD 0.5 ML IM SUSY: 0.5000 mL | PREFILLED_SYRINGE | Freq: Once | INTRAMUSCULAR | Status: DC

## 2016-06-22 NOTE — Progress Notes (Signed)
Patient is here for follow up  

## 2016-06-22 NOTE — Assessment & Plan Note (Addendum)
#   LOW Grade lymphoma- B-cell lymphoma. Status post multiple lines of therapy in the past. Reviewed the most recent PET scan in April 2017- did show low-grade activity. I agree with surveillance at this time.  # Discussed multiple treatment options available for the treatment of low-grade lymphoma at this time including but not limited to prednisone; repeat Rituxan; Gazyva; and ibrutinib etc.   # follow up in mid-April 2017 PET scan prior/ labs- CBC/CMP/LDH. Patient's son agree with the plan.  # 25 minutes face-to-face with the patient discussing the above plan of care; more than 50% of time spent on prognosis/ natural history; counseling and coordination.

## 2016-06-22 NOTE — Progress Notes (Signed)
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;  Rituxan once a week started in January of 2013  # July 2016-RECURRENCE- LYMPH NODE, LEFT AXILLA; EXCISIONAL BIOPSY:  - LOW-GRADE B-CELL LYMPHOMA, IMMUNOPHENOTYPICALLY MOST CONSISTENT WITH CLL/SLL,   # acute MI in December of 2014; #  upper and lower endoscopy done in May of 2015     Lymphoma in remission [facility])    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 I reviewed the patient's prior charts/pertinent labs/imaging in detail; findings are summarized above.     INTERVAL HISTORY:  Jeanne Pittman 81 y.o.  female pleasant patient above history of Low-grade lymphoma/B cell non-Hodgkin type is here for follow-up accompanied by her son.  Patient denies any unusual weight gain or weight loss. Denies any lumps or bumps. No fevers or chills. Has chronic mild low back pain.   REVIEW OF SYSTEMS:  A complete 10 point review of system is done which is negative except mentioned above/history of present illness.   PAST MEDICAL  HISTORY :  Past Medical History:  Diagnosis Date  . A-fib (HCC)    A. fib with RVR in the setting of myocardial infarction. Converted to sinus rhythm with amiodarone.  . Atrophic vaginitis   . CHF (congestive heart failure) ([place])   . Chronic systolic heart failure (HCC)    EF was 25-35% post MI but improved to 45-50% in 04/2013  . Coronary artery disease 08/20/12   ST elevation myocardial infarction with late presentation and cardiogenic shock. Cardiac catheterization showed occluded mid LAD and 95% stenosis in proximal RCA. She had 2 drug-eluting stent placements to the mid LAD and one drug-eluting stent placement to the proximal RCA. Ejection fraction was 25%.  [name] Dysrhythmia   . Fibrocystic breast disease   . GERD (gastroesophageal reflux disease)   . Glaucoma   . Glaucoma   . History of gastritis   . Hyperlipidemia   . Hypertension   . Insomnia   . Migraine headache   . Myocardial infarction 08/18/12  . Non Hodgkin's lymphoma ([place]) 2005   reoccurance 2007 and 2012  . Osteoarthritis   . Polyneuropathy ([place])   . Polyposis of colon   . Restless leg syndrome   . Urinary incontinence   . Urinary, incontinence, stress female     PAST SURGICAL HISTORY :   Past Surgical History:  Procedure Laterality Date  . ABDOMINAL HYSTERECTOMY  1956  . APPENDECTOMY    . AXILLARY LYMPH NODE BIOPSY Left 03/18/2015   Procedure: AXILLARY LYMPH NODE BIOPSY;  Surgeon: [name], MD;  Location: [name];  Service: General;  Laterality: Left;  . CARDIAC CATHETERIZATION  08/19/2012   [name]; ARIDA  . COLONOSCOPY    . COLONOSCOPY WITH PROPOFOL N/A 03/02/2016   Procedure: COLONOSCOPY WITH PROPOFOL;  Surgeon: Manya Silvas, MD;  Location: [facility] ENDOSCOPY;  Service: Endoscopy;  Laterality: N/A;  . CORONARY ANGIOPLASTY WITH STENT PLACEMENT     x3 stents  . CORONARY ARTERY BYPASS GRAFT    . ESOPHAGOGASTRODUODENOSCOPY (EGD) WITH PROPOFOL N/A 03/02/2016   Procedure: ESOPHAGOGASTRODUODENOSCOPY (EGD)  WITH PROPOFOL;  Surgeon: Manya Silvas, MD;  Location: [facility]OSCOPY;  Service: Endoscopy;  Laterality: N/A;  . LYMPH NODE BIOPSY Right 07-24-11   Dr Bary Castilla  . PTCA    . skin cancer removal    . TOTAL ABDOMINAL HYSTERECTOMY W/ BILATERAL SALPINGOOPHORECTOMY      FAMILY HISTORY :   Family History  Problem Relation Age of Onset  . Heart attack Father   . Heart disease Brother   . Leukemia Mother     SOCIAL HISTORY:   Social History  Substance Use Topics  . Smoking status: Former Smoker    Types: Cigarettes  . Smokeless tobacco: Never Used  . Alcohol use No    ALLERGIES:  is allergic to ace inhibitors; benadryl [diphenhydramine hcl]; codeine; hydrocodone; and guaifenesin & derivatives.  MEDICATIONS:  Current Outpatient Prescriptions  Medication Sig Dispense Refill  . acetaminophen (TYLENOL) 650 MG CR tablet Take 650 mg by mouth every 8 (eight) hours as needed for pain.    [name] aspirin 81 MG tablet Take 81 mg by mouth every morning.     . carvedilol (COREG) 3.125 MG tablet Take 1 tablet (3.125 mg total) by mouth 2 (two) times daily with a meal. 60 tablet 3  . Cholecalciferol (VITAMIN D-3) 5000 UNITS TABS Take 2,000 Int'l Units by mouth every morning.     . fexofenadine (ALLEGRA) 180 MG tablet Take 180 mg by mouth daily as needed.    . furosemide (LASIX) 20 MG tablet Take 1 tablet (20 mg total) by mouth daily as needed. 30 tablet 3  . Ginkgo Biloba 60 MG TABS Take 1 tablet by mouth every morning.    . latanoprost (XALATAN) 0.005 % ophthalmic solution Place 1 drop into both eyes at bedtime.     [name] LORazepam (ATIVAN) 0.5 MG tablet Take 0.5 mg by mouth every 8 (eight) hours as needed for anxiety.     [name] losartan (COZAAR) 25 MG tablet Take 0.5 tablets (12.5 mg total) by mouth daily after breakfast. 30 tablet 6  . pantoprazole (PROTONIX) 40 MG tablet TAKE 1 TABLET (40 MG TOTAL) BY MOUTH 2 (TWO) TIMES DAILY. 60 tablet 6  . pregabalin (LYRICA) 50 MG capsule Take 50 mg by mouth at bedtime  as needed (restless leg).     . rosuvastatin (CRESTOR) 10 MG tablet TAKE 1 TABLET (10 MG TOTAL) BY MOUTH DAILY. 30 tablet 3  . Oxybutynin Chloride (DITROPAN PO) Take 1 tablet by mouth as needed.     . timolol (TIMOPTIC) 0.5 % ophthalmic solution Place 1 drop into both eyes 2 (two) times daily.      Current Facility-Administered Medications  Medication Dose Route Frequency Provider Last Rate Last Dose  . Influenza vac split quadrivalent PF (FLUARIX) injection 0.5 mL  0.5 mL Intramuscular Once Cammie Sickle, MD        PHYSICAL EXAMINATION: ECOG PERFORMANCE STATUS: 0 - Asymptomatic  BP (!) 158/79 (BP Location: Left Arm, Patient Position: Sitting)  Pulse 67   Temp (!) 96.7 F (35.9 C) (Tympanic)   Resp 18   Wt 117 lb (53.1 kg)   BMI 22.11 kg/m   Filed Weights   06/22/16 1600  Weight: 117 lb (53.1 kg)    GENERAL: Well-nourished well-developed; Alert, no distress and comfortable.   Accompanied by her son. ES: no pallor or icterus OROPHARYNX: no thrush or ulceration; good dentition  NECK: supple, no masses felt LYMPH:  no palpable lymphadenopathy in the cervical,  or inguinal regions. Bilateral axillary adenopathy 1-2 cm in size.  LUNGS: clear to auscultation and  No wheeze or crackles HEART/CVS: regular rate & rhythm and no murmurs; No lower extremity edema ABDOMEN:abdomen soft, non-tender and normal bowel sounds Musculoskeletal:no cyanosis of digits and no clubbing  PSYCH: alert & oriented x 3 with fluent speech NEURO: no focal motor/sensory deficits SKIN:  no rashes or significant lesions  LABORATORY DATA:  I have reviewed the data as listed    Component Value Date/Time   NA 137 06/22/2016 1512   NA 141 09/07/2014 1449   K 3.6 06/22/2016 1512   K 4.0 09/07/2014 1449   CL 105 06/22/2016 1512   CL 106 09/07/2014 1449   CO2 27 06/22/2016 1512   CO2 29 09/07/2014 1449   GLUCOSE 105 (H) 06/22/2016 1512   GLUCOSE 109 (H) 09/07/2014 1449   BUN 23 (H) 06/22/2016 1512    BUN 19 (H) 09/07/2014 1449   CREATININE 0.78 06/22/2016 1512   CREATININE 1.01 09/07/2014 1449   CALCIUM 9.0 06/22/2016 1512   CALCIUM 8.8 09/07/2014 1449   PROT 6.7 06/22/2016 1512   PROT 6.3 09/02/2015 0824   PROT 6.5 09/07/2014 1449   ALBUMIN 4.3 06/22/2016 1512   ALBUMIN 4.2 09/02/2015 0824   ALBUMIN 3.8 09/07/2014 1449   AST 17 06/22/2016 1512   AST 11 (L) 09/07/2014 1449   ALT 12 (L) 06/22/2016 1512   ALT 22 09/07/2014 1449   ALKPHOS 59 06/22/2016 1512   ALKPHOS 75 09/07/2014 1449   BILITOT 0.7 06/22/2016 1512   BILITOT 0.4 09/02/2015 0824   BILITOT 0.8 09/07/2014 1449   GFRNONAA >60 06/22/2016 1512   GFRNONAA 56 (L) 09/07/2014 1449   GFRNONAA 44 (L) 02/09/2014 1351   GFRAA >60 06/22/2016 1512   GFRAA >60 09/07/2014 1449   GFRAA 51 (L) 02/09/2014 1351    No results found for: SPEP, UPEP  Lab Results  Component Value Date   WBC 6.5 06/22/2016   NEUTROABS 2.3 06/22/2016   HGB 13.1 06/22/2016   HCT 37.7 06/22/2016   MCV 93.5 06/22/2016   PLT 127 (L) 06/22/2016      Chemistry      Component Value Date/Time   NA 137 06/22/2016 1512   NA 141 09/07/2014 1449   K 3.6 06/22/2016 1512   K 4.0 09/07/2014 1449   CL 105 06/22/2016 1512   CL 106 09/07/2014 1449   CO2 27 06/22/2016 1512   CO2 29 09/07/2014 1449   BUN 23 (H) 06/22/2016 1512   BUN 19 (H) 09/07/2014 1449   CREATININE 0.78 06/22/2016 1512   CREATININE 1.01 09/07/2014 1449      Component Value Date/Time   CALCIUM 9.0 06/22/2016 1512   CALCIUM 8.8 09/07/2014 1449   ALKPHOS 59 06/22/2016 1512   ALKPHOS 75 09/07/2014 1449   AST 17 06/22/2016 1512   AST 11 (L) 09/07/2014 1449   ALT 12 (L) 06/22/2016 1512   ALT 22 09/07/2014 1449   BILITOT 0.7  06/22/2016 1512   BILITOT 0.4 09/02/2015 0824   BILITOT 0.8 09/07/2014 1449       RADIOGRAPHIC STUDIES: I have personally reviewed the radiological images as listed and agreed with the findings in the report. No results found.   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 Status post multiple lines of therapy in the past. Reviewed the most recent PET scan in April 2017- did show low-grade activity. I agree with surveillance at this time.  # Discussed multiple treatment options available for the treatment of low-grade lymphoma at this time including but not limited to prednisone; repeat Rituxan; Gazyva; and ibrutinib etc.   # follow up in mid-April 2017 PET scan prior/ labs- CBC/CMP/LDH. Patient's son agree with the plan.  # 25 minutes face-to-face with the patient discussing the above plan of care; more than 50% of time spent on prognosis/ natural history; counseling and coordination.   Orders Placed This Encounter  Procedures  . NM PET Image Restag (PS) Skull Base To Thigh    Standing Status:   Future    Standing Expiration Date:   08/22/2017    Order Specific Question:   Reason for Exam (SYMPTOM  OR DIAGNOSIS REQUIRED)    Answer:   low grade lymhoma    Order Specific Question:   Preferred imaging location?    Answer:   [facility]   All questions were answered. The patient knows to call the clinic with any problems, questions or concerns.      Cammie Sickle, MD 06/22/2016 5:11 PM

## 2016-06-26 ENCOUNTER — Encounter: Payer: Self-pay | Admitting: Cardiovascular Disease

## 2016-06-26 ENCOUNTER — Ambulatory Visit (INDEPENDENT_AMBULATORY_CARE_PROVIDER_SITE_OTHER): Payer: Medicare Other | Admitting: Cardiovascular Disease

## 2016-06-26 VITALS — BP 130/54 | HR 65 | Ht 61.5 in | Wt 117.5 lb

## 2016-06-26 DIAGNOSIS — I2510 Atherosclerotic heart disease of native coronary artery without angina pectoris: Secondary | ICD-10-CM

## 2016-06-26 DIAGNOSIS — I5022 Chronic systolic (congestive) heart failure: Secondary | ICD-10-CM | POA: Diagnosis not present

## 2016-06-26 DIAGNOSIS — Z23 Encounter for immunization: Secondary | ICD-10-CM | POA: Diagnosis not present

## 2016-06-26 NOTE — Progress Notes (Signed)
Cardiology Office Note   Date:  06/26/2016   ID:  Jeanne Pittman, DOB 17-Mar-1935, MRN [id number]  PCP:  [name], MD  Cardiologist:   Kathlyn Sacramento, MD   Chief Complaint  Patient presents with  . Coronary Artery Disease    Pt c/o fatigue  . PAFIB  . CSHF      History of Present Illness: Jeanne Pittman is a 81 y.o. female who presents for a followup visit regarding coronary artery disease and chronic systolic heart failure due to nonischemic cardiomyopathy. She had anterior ST elevation myocardial infarction in December 2013 with late presentation complicated by cardiogenic shock. Emergent cardiac catheterization showed an occluded mid LAD and 95% stenosis in the proximal RCA. 2 drug-eluting stents were placed in the mid LAD and 1 drug-eluting stent to the proximal RCA. Ejection fraction was 25-30% with akinesis of the mid distal anterior, apical and distal inferior wall.  Most recent ejection fraction was 45-50% in September, 2014. She has known history of non-Hodgkin's lymphoma which is being observed. She has known history of myalgia with atorvastatin but she has been tolerating rosuvastatin. She has been doing extremely well with no chest pain or shortness of breath. No palpitations.  Past Medical History:  Diagnosis Date  . A-fib (HCC)    A. fib with RVR in the setting of myocardial infarction. Converted to sinus rhythm with amiodarone.  . Atrophic vaginitis   . CHF (congestive heart failure) ([place])   . Chronic systolic heart failure (HCC)    EF was 25-35% post MI but improved to 45-50% in 04/2013  . Coronary artery disease 08/20/12   ST elevation myocardial infarction with late presentation and cardiogenic shock. Cardiac catheterization showed occluded mid LAD and 95% stenosis in proximal RCA. She had 2 drug-eluting stent placements to the mid LAD and one drug-eluting stent placement to the proximal RCA. Ejection fraction was 25%.  [name] Dysrhythmia   . Fibrocystic  breast disease   . GERD (gastroesophageal reflux disease)   . Glaucoma   . Glaucoma   . History of gastritis   . Hyperlipidemia   . Hypertension   . Insomnia   . Migraine headache   . Myocardial infarction 08/18/12  . Non Hodgkin's lymphoma ([place]) 2005   reoccurance 2007 and 2012  . Osteoarthritis   . Polyneuropathy ([place])   . Polyposis of colon   . Restless leg syndrome   . Urinary incontinence   . Urinary, incontinence, stress female     Past Surgical History:  Procedure Laterality Date  . ABDOMINAL HYSTERECTOMY  1956  . APPENDECTOMY    . AXILLARY LYMPH NODE BIOPSY Left 03/18/2015   Procedure: AXILLARY LYMPH NODE BIOPSY;  Surgeon: [name], MD;  Location: [name];  Service: General;  Laterality: Left;  . CARDIAC CATHETERIZATION  08/19/2012   [name]; Lorretta Kerce  . COLONOSCOPY    . COLONOSCOPY WITH PROPOFOL N/A 03/02/2016   Procedure: COLONOSCOPY WITH PROPOFOL;  Surgeon: Manya Silvas, MD;  Location: [facility] ENDOSCOPY;  Service: Endoscopy;  Laterality: N/A;  . CORONARY ANGIOPLASTY WITH STENT PLACEMENT     x3 stents  . CORONARY ARTERY BYPASS GRAFT    . ESOPHAGOGASTRODUODENOSCOPY (EGD) WITH PROPOFOL N/A 03/02/2016   Procedure: ESOPHAGOGASTRODUODENOSCOPY (EGD) WITH PROPOFOL;  Surgeon: Manya Silvas, MD;  Location: [facility] ENDOSCOPY;  Service: Endoscopy;  Laterality: N/A;  . LYMPH NODE BIOPSY Right 07-24-11   Dr Bary Castilla  . PTCA    . skin cancer removal    .  TOTAL ABDOMINAL HYSTERECTOMY W/ BILATERAL SALPINGOOPHORECTOMY       Current Outpatient Prescriptions  Medication Sig Dispense Refill  . acetaminophen (TYLENOL) 650 MG CR tablet Take 650 mg by mouth every 8 (eight) hours as needed for pain.    [name] aspirin 81 MG tablet Take 81 mg by mouth every morning.     . carvedilol (COREG) 3.125 MG tablet Take 1 tablet (3.125 mg total) by mouth 2 (two) times daily with a meal. 60 tablet 3  . Cholecalciferol (VITAMIN D-3) 5000 UNITS TABS Take 2,000 Int'l Units by mouth every morning.       . fexofenadine (ALLEGRA) 180 MG tablet Take 180 mg by mouth daily as needed.    . furosemide (LASIX) 20 MG tablet Take 1 tablet (20 mg total) by mouth daily as needed. 30 tablet 3  . Ginkgo Biloba 60 MG TABS Take 1 tablet by mouth every morning.    . latanoprost (XALATAN) 0.005 % ophthalmic solution Place 1 drop into both eyes at bedtime.     [name] LORazepam (ATIVAN) 0.5 MG tablet Take 0.5 mg by mouth every 8 (eight) hours as needed for anxiety.     [name] losartan (COZAAR) 25 MG tablet Take 0.5 tablets (12.5 mg total) by mouth daily after breakfast. 30 tablet 6  . Oxybutynin Chloride (DITROPAN PO) Take 1 tablet by mouth as needed.     . pantoprazole (PROTONIX) 40 MG tablet TAKE 1 TABLET (40 MG TOTAL) BY MOUTH 2 (TWO) TIMES DAILY. 60 tablet 6  . pregabalin (LYRICA) 50 MG capsule Take 50 mg by mouth at bedtime as needed (restless leg).     . rosuvastatin (CRESTOR) 10 MG tablet TAKE 1 TABLET (10 MG TOTAL) BY MOUTH DAILY. 30 tablet 3  . timolol (TIMOPTIC) 0.5 % ophthalmic solution Place 1 drop into both eyes 2 (two) times daily.      No current facility-administered medications for this visit.    Facility-Administered Medications Ordered in Other Visits  Medication Dose Route Frequency Provider Last Rate Last Dose  . Influenza vac split quadrivalent PF (FLUARIX) injection 0.5 mL  0.5 mL Intramuscular Once Cammie Sickle, MD        Allergies:   Ace inhibitors; Benadryl [diphenhydramine hcl]; Codeine; Hydrocodone; and Guaifenesin & derivatives    Social History:  The patient  reports that she has quit smoking. Her smoking use included Cigarettes. She has never used smokeless tobacco. She reports that she does not drink alcohol or use drugs.   Family History:  The patient's family history includes Heart attack in her father; Heart disease in her brother; Leukemia in her mother.    ROS:  Please see the history of present illness.   Otherwise, review of systems are positive for none.   All other  systems are reviewed and negative.    PHYSICAL EXAM: VS:  BP (!) 130/54   Pulse 65   Ht 5' 1.5" (1.562 m)   Wt 117 lb 8 oz (53.3 kg)   SpO2 96%   BMI 21.84 kg/m  , BMI Body mass index is 21.84 kg/m. GEN: Well nourished, well developed, in no acute distress  HEENT: normal  Neck: no JVD, carotid bruits, or masses Cardiac: RRR; no murmurs, rubs, or gallops,no edema  Respiratory:  clear to auscultation bilaterally, normal work of breathing GI: soft, nontender, nondistended, + BS MS: no deformity or atrophy  Skin: warm and dry, no rash Neuro:  Strength and sensation are intact Psych: euthymic mood, full affect  EKG:  EKG is ordered today. The ekg ordered today demonstrates normal sinus rhythm with possible old anterior infarct .  Recent Labs: 06/22/2016: ALT 12; BUN 23; Creatinine, Ser 0.78; Hemoglobin 13.1; Platelets 127; Potassium 3.6; Sodium 137    Lipid Panel    Component Value Date/Time   CHOL 172 09/02/2015 0824   TRIG 111 09/02/2015 0824   HDL 51 09/02/2015 0824   CHOLHDL 3.4 09/02/2015 0824   LDLCALC 99 09/02/2015 0824      Wt Readings from Last 3 Encounters:  06/26/16 117 lb 8 oz (53.3 kg)  06/22/16 117 lb (53.1 kg)  03/02/16 115 lb (52.2 kg)        ASSESSMENT AND PLAN:  1.  Coronary artery disease involving native coronary arteries without angina: She is overall doing well with no anginal symptoms. Continue medical therapy and low-dose aspirin.  2. Hyperlipidemia: She is tolerating rosuvastatin.  I reviewed most recent lipid profile in July which showed improvement in LDL to 70. Continue current dose of 10 mg once daily.  3. Ischemic cardiomyopathy: Most recent ejection fraction was 45 to 50 %. Continue low-dose carvedilol and losartan. She is euvolemic.  Disposition:   FU with me in 12 months  Signed,  Kathlyn Sacramento, MD  06/26/2016 1:31 PM    [place]

## 2016-06-26 NOTE — Patient Instructions (Signed)
Medication Instructions: Continue same medications.   Labwork: None.   Procedures/Testing: None.   Follow-Up: 1 year with Dr. Arida.   Any Additional Special Instructions Will Be Listed Below (If Applicable).     If you need a refill on your cardiac medications before your next appointment, please call your pharmacy.   

## 2016-08-23 ENCOUNTER — Other Ambulatory Visit: Payer: Self-pay

## 2016-08-23 MED ORDER — ROSUVASTATIN CALCIUM 10 MG PO TABS: ORAL_TABLET | ORAL | 3 refills | Status: DC

## 2016-08-28 ENCOUNTER — Other Ambulatory Visit: Payer: Self-pay

## 2016-08-28 MED ORDER — PANTOPRAZOLE SODIUM 40 MG PO TBEC: 40.0000 mg | DELAYED_RELEASE_TABLET | Freq: Two times a day (BID) | ORAL | 6 refills | Status: DC

## 2016-08-28 MED ORDER — CARVEDILOL 3.125 MG PO TABS: 3.1250 mg | ORAL_TABLET | Freq: Two times a day (BID) | ORAL | 6 refills | Status: DC

## 2016-09-18 ENCOUNTER — Other Ambulatory Visit: Payer: Self-pay

## 2016-09-18 MED ORDER — LOSARTAN POTASSIUM 25 MG PO TABS: 12.5000 mg | ORAL_TABLET | Freq: Every day | ORAL | 6 refills | Status: DC

## 2016-09-29 ENCOUNTER — Other Ambulatory Visit: Payer: Self-pay | Admitting: Internal Medicine

## 2016-09-29 DIAGNOSIS — Z1239 Encounter for other screening for malignant neoplasm of breast: Secondary | ICD-10-CM

## 2016-09-29 DIAGNOSIS — R109 Unspecified abdominal pain: Secondary | ICD-10-CM

## 2016-10-04 ENCOUNTER — Ambulatory Visit
Admission: RE | Admit: 2016-10-04 | Discharge: 2016-10-04 | Disposition: A | Discharge status: Home / Self Care | Payer: Medicare Other | Source: Ambulatory Visit | Attending: Internal Medicine | Admitting: Internal Medicine

## 2016-10-04 DIAGNOSIS — N200 Calculus of kidney: Secondary | ICD-10-CM | POA: Diagnosis not present

## 2016-10-04 DIAGNOSIS — R109 Unspecified abdominal pain: Secondary | ICD-10-CM

## 2016-10-10 ENCOUNTER — Encounter: Payer: Self-pay | Admitting: Urology

## 2016-10-10 ENCOUNTER — Ambulatory Visit (INDEPENDENT_AMBULATORY_CARE_PROVIDER_SITE_OTHER): Payer: Medicare Other | Admitting: Urology

## 2016-10-10 VITALS — BP 148/76 | HR 69 | Ht 61.0 in | Wt 118.0 lb

## 2016-10-10 DIAGNOSIS — R350 Frequency of micturition: Secondary | ICD-10-CM | POA: Diagnosis not present

## 2016-10-10 DIAGNOSIS — N200 Calculus of kidney: Secondary | ICD-10-CM | POA: Diagnosis not present

## 2016-10-10 DIAGNOSIS — N3941 Urge incontinence: Secondary | ICD-10-CM

## 2016-10-10 LAB — URINALYSIS, COMPLETE
Bilirubin, UA: NEGATIVE
Glucose, UA: NEGATIVE
Ketones, UA: NEGATIVE
NITRITE UA: NEGATIVE
PH UA: 6.5 (ref 5.0–7.5)
Protein, UA: NEGATIVE
RBC UA: NEGATIVE
SPEC GRAV UA: 1.01 (ref 1.005–1.030)
Urobilinogen, Ur: 0.2 mg/dL (ref 0.2–1.0)

## 2016-10-10 LAB — MICROSCOPIC EXAMINATION

## 2016-10-10 MED ORDER — OXYBUTYNIN CHLORIDE ER 10 MG PO TB24: 10.0000 mg | ORAL_TABLET | Freq: Every day | ORAL | 3 refills | Status: DC

## 2016-10-10 NOTE — Progress Notes (Signed)
10/10/2016 3:46 PM   Jeanne Pittman 15-May-1935 [id number]  Referring provider: [name], MD [facility] [place], [place] [address]  Chief Complaint  Patient presents with  . Nephrolithiasis    New Patient    HPI: 82 year old female who presents for further evaluation of bilateral flank pain and flank pain.  Today, she denies any overt flank pain. She has diffuse chronic low back pain across her upper and lower back. She was previously followed by a chiropractor for this. She states that over the past several weeks to months, her pain is been increasingly severe and limits her daily activity. She is concerned it might be her kidneys.  She underwent further workup with her PCP, Dr. Doy Hutching, on 09/28/2016 at which time her urinalysis showed only 4-10 white blood cells, otherwise unremarkable. She ultimately grew mixed flora, 20-50k CFU.  No microscopic hematuria.   Renal ultrasound on 09/24/2016 showed a small 3.7 mm nonobstructing right mid pole calculus, otherwise no hydroureteronephrosis or any concerning lesions other than small simple cyst.  Several previous CT scans were reviewed and no stones were identified on any previous imaging.  Past medical history significant for A. fib, CHF, recurrent non-Hodgkin's lymphoma.  Most recent PET scan on 11/2015 shows stable low activity enlarged lymph nodes in her neck, axillary, subpectoral, and retroperitoneum. She is due for another PET scan in April 2018.  Today, she also complains of a baseline daytime and nighttime urinary urgency, frequency, and urge incontinence. She denies any significant stress incontinence. This has been going on for several years but is worsened. She does take Lasix as needed which is partially once per week which exacerbates her symptoms.  She gets up every 2-3 hours at night to void which is bothersome to her. She does snore but has never been diagnosed with sleep apnea. She has had  a few rare UTIs.  She does drink water as her primary beverage throughout the day as well as a small amount just before bed.  She has tried oxybutynin about 3 years ago in the past which was helpful. She has not been taking this for the past several years due to other medical issues. She does have chronic dry eye and dry mouth.  She did have an episode of dysuria a few weeks ago which resolved with Pyridium. No gross hematuria.   PMH: Past Medical History:  Diagnosis Date  . A-fib (HCC)    A. fib with RVR in the setting of myocardial infarction. Converted to sinus rhythm with amiodarone.  . Atrophic vaginitis   . CHF (congestive heart failure) ([place])   . Chronic systolic heart failure (HCC)    EF was 25-35% post MI but improved to 45-50% in 04/2013  . Coronary artery disease 08/20/12   ST elevation myocardial infarction with late presentation and cardiogenic shock. Cardiac catheterization showed occluded mid LAD and 95% stenosis in proximal RCA. She had 2 drug-eluting stent placements to the mid LAD and one drug-eluting stent placement to the proximal RCA. Ejection fraction was 25%.  [name] Dysrhythmia   . Fibrocystic breast disease   . GERD (gastroesophageal reflux disease)   . Glaucoma   . Glaucoma   . History of gastritis   . Hyperlipidemia   . Hypertension   . Insomnia   . Migraine headache   . Myocardial infarction 08/18/12  . Non Hodgkin's lymphoma ([place]) 2005   reoccurance 2007 and 2012  . Osteoarthritis   . Polyneuropathy (  [place])   . Polyposis of colon   . Restless leg syndrome   . Urinary incontinence   . Urinary, incontinence, stress female     Surgical History: Past Surgical History:  Procedure Laterality Date  . ABDOMINAL HYSTERECTOMY  1956  . APPENDECTOMY    . AXILLARY LYMPH NODE BIOPSY Left 03/18/2015   Procedure: AXILLARY LYMPH NODE BIOPSY;  Surgeon: [name], MD;  Location: [name];  Service: General;  Laterality: Left;  . CARDIAC CATHETERIZATION   08/19/2012   [name]; [place]  . COLONOSCOPY    . COLONOSCOPY WITH PROPOFOL N/A 03/02/2016   Procedure: COLONOSCOPY WITH PROPOFOL;  Surgeon: Manya Silvas, MD;  Location: [facility] ENDOSCOPY;  Service: Endoscopy;  Laterality: N/A;  . CORONARY ANGIOPLASTY WITH STENT PLACEMENT     x3 stents  . CORONARY ARTERY BYPASS GRAFT    . ESOPHAGOGASTRODUODENOSCOPY (EGD) WITH PROPOFOL N/A 03/02/2016   Procedure: ESOPHAGOGASTRODUODENOSCOPY (EGD) WITH PROPOFOL;  Surgeon: Manya Silvas, MD;  [facility] ENDOSCOPY;  Service: Endoscopy;  Laterality: N/A;  . LYMPH NODE BIOPSY Right 07-24-11   Dr Bary Castilla  . PTCA    . skin cancer removal    . TOTAL ABDOMINAL HYSTERECTOMY W/ BILATERAL SALPINGOOPHORECTOMY      Home Medications:  Allergies as of 10/10/2016      Reactions   Ace Inhibitors    Benadryl [diphenhydramine Hcl]    Codeine    Hydrocodone    Guaifenesin & Derivatives Rash      Medication List       Accurate as of 10/10/16  3:46 PM. Always use your most recent med list.          acetaminophen 650 MG CR tablet Commonly known as:  TYLENOL Take 650 mg by mouth every 8 (eight) hours as needed for pain.   aspirin 81 MG tablet Take 81 mg by mouth every morning.   carvedilol 3.125 MG tablet Commonly known as:  COREG Take 1 tablet (3.125 mg total) by mouth 2 (two) times daily with a meal.   fexofenadine 180 MG tablet Commonly known as:  ALLEGRA Take 180 mg by mouth daily as needed.   furosemide 20 MG tablet Commonly known as:  LASIX Take 1 tablet (20 mg total) by mouth daily as needed.   latanoprost 0.005 % ophthalmic solution Commonly known as:  XALATAN Place 1 drop into both eyes at bedtime.   LORazepam 0.5 MG tablet Commonly known as:  ATIVAN Take 0.5 mg by mouth every 8 (eight) hours as needed for anxiety.   losartan 25 MG tablet Commonly known as:  COZAAR Take 0.5 tablets (12.5 mg total) by mouth daily after breakfast.   oxybutynin 10 MG 24 hr tablet Commonly known as:   DITROPAN-XL Take 1 tablet (10 mg total) by mouth daily.   pantoprazole 40 MG tablet Commonly known as:  PROTONIX Take 1 tablet (40 mg total) by mouth 2 (two) times daily.   pregabalin 50 MG capsule Commonly known as:  LYRICA Take 50 mg by mouth at bedtime as needed (restless leg).   rosuvastatin 10 MG tablet Commonly known as:  CRESTOR TAKE 1 TABLET (10 MG TOTAL) BY MOUTH DAILY.   timolol 0.5 % ophthalmic solution Commonly known as:  TIMOPTIC Place 1 drop into both eyes 2 (two) times daily.   Vitamin D-3 5000 units Tabs Take 2,000 Int'l Units by mouth every morning.       Allergies:  Allergies  Allergen Reactions  . Ace Inhibitors   .  Benadryl [Diphenhydramine Hcl]   . Codeine   . Hydrocodone   . Guaifenesin & Derivatives Rash    Family History: Family History  Problem Relation Age of Onset  . Heart attack Father   . Heart disease Brother   . Leukemia Mother   . Bladder Cancer Neg Hx   . Prostate cancer Neg Hx   . Kidney cancer Neg Hx     Social History:  reports that she has quit smoking. Her smoking use included Cigarettes. She has never used smokeless tobacco. She reports that she does not drink alcohol or use drugs.  ROS: UROLOGY Frequent Urination?: Yes Hard to postpone urination?: Yes Burning/pain with urination?: Yes Get up at night to urinate?: Yes Leakage of urine?: Yes Urine stream starts and stops?: No Trouble starting stream?: No Do you have to strain to urinate?: No Blood in urine?: No Urinary tract infection?: Yes Sexually transmitted disease?: No Injury to kidneys or bladder?: No Painful intercourse?: No Weak stream?: No Currently pregnant?: No Vaginal bleeding?: No Last menstrual period?: n  Gastrointestinal Nausea?: Yes Vomiting?: No Indigestion/heartburn?: Yes Diarrhea?: No Constipation?: No  Constitutional Fever: No Night sweats?: No Weight loss?: No Fatigue?: Yes  Skin Skin rash/lesions?: No Itching?:  No  Eyes Blurred vision?: No Double vision?: No  Ears/Nose/Throat Sore throat?: No Sinus problems?: Yes  Hematologic/Lymphatic Swollen glands?: Yes Easy bruising?: Yes  Cardiovascular Leg swelling?: No Chest pain?: No  Respiratory Cough?: No Shortness of breath?: No  Endocrine Excessive thirst?: No  Musculoskeletal Back pain?: Yes Joint pain?: Yes  Neurological Headaches?: Yes Dizziness?: No  Psychologic Depression?: No Anxiety?: No  Physical Exam: BP (!) 148/76   Pulse 69   Ht 5\' 1"  (1.549 m)   Wt 118 lb (53.5 kg)   BMI 22.30 kg/m   Constitutional:  Alert and oriented, No acute distress.  Accompanied by her son today. HEENT: [place] AT, moist mucus membranes.  Trachea midline, no masses. Cardiovascular: No clubbing, cyanosis, or edema. Respiratory: Normal respiratory effort, no increased work of breathing. GI: Abdomen is soft, nontender, nondistended, no abdominal masses. GU: No CVA tenderness.  Skin: No rashes, bruises or suspicious lesions. MSK: Tenderness in her paraspinous musculature and particularly overlying her SI joints, reproducible with deep palpation. Neurologic: Grossly intact, no focal deficits, moving all 4 extremities. Psychiatric: Normal mood and affect.  Laboratory Data: Lab Results  Component Value Date   WBC 6.5 06/22/2016   HGB 13.1 06/22/2016   HCT 37.7 06/22/2016   MCV 93.5 06/22/2016   PLT 127 (L) 06/22/2016    Lab Results  Component Value Date   CREATININE 0.78 06/22/2016   Urinalysis UA today shows 6-10 white blood cells, otherwise negative. No blood.  Pertinent Imaging: CLINICAL DATA:  82 y/o  F; bilateral flank pain.  EXAM: RENAL / URINARY TRACT ULTRASOUND COMPLETE  COMPARISON:  11/23/2015 PET-CT.  FINDINGS: Right Kidney:  Length: 9.2 cm. Echogenic focus with distal acoustic shadowing within the mid kidney measuring 3.7 mm compatible with stone. No mass identified.  Left Kidney:  Length: 9.0 cm.  Lower pole 1.1 cm anechoic well-circumscribed focus with enhanced through transmission compatible with simple cyst.  Bladder:  Normal.  Bilateral ureteral jets seen.  IMPRESSION: Right kidney interpolar nonobstructing stone.  No hydronephrosis.   Electronically Signed   By: Kristine Garbe M.D.   On: 10/04/2016 16:10  Renal ultrasound reviewed personally today with the patient. This is compared to previous CT scans including  PET 11/2015.  Assessment & Plan:  1. Nephrolithiasis Possible nonobstructing right 4 mm stone on renal ultrasound but not on any previous cross-sectional imaging. This may or may not represent a stone as RUS is not the gold standard for identifying kidney stones. In addition, the diffuse pain that she is experiencing is not consistent with renal colic and this is highly unlikely as the cause of her discomfort. Plan to review next CT PET to assess for presence or absence of stone due 11/2016. - Urinalysis, Complete  2. Urinary frequency Discussed behavioral modification UA today not worrisome for UTI, will recheck urine culture  Requests refill for oxybutinin - will prescribed 10 mg XL daily  Common side effects including dry eye, mouth and constipation Reassess in 3 months  3. Urge incontinence of urine As above   Return in about 3 months (around 01/07/2017) for PVR, recheck OAB symptoms.  Hollice Espy, MD  [facility] [address] [place], [place] [address] [phone]

## 2016-11-02 ENCOUNTER — Ambulatory Visit
Admission: RE | Admit: 2016-11-02 | Discharge: 2016-11-02 | Disposition: A | Discharge status: Home / Self Care | Payer: Medicare Other | Source: Ambulatory Visit | Attending: Internal Medicine | Admitting: Internal Medicine

## 2016-11-02 DIAGNOSIS — Z1239 Encounter for other screening for malignant neoplasm of breast: Secondary | ICD-10-CM

## 2016-11-02 DIAGNOSIS — Z1231 Encounter for screening mammogram for malignant neoplasm of breast: Secondary | ICD-10-CM | POA: Diagnosis not present

## 2016-12-01 ENCOUNTER — Encounter
Admission: RE | Admit: 2016-12-01 | Discharge: 2016-12-01 | Disposition: A | Discharge status: Home / Self Care | Payer: Medicare Other | Source: Ambulatory Visit | Attending: Internal Medicine | Admitting: Internal Medicine

## 2016-12-01 DIAGNOSIS — C8304 Small cell B-cell lymphoma, lymph nodes of axilla and upper limb: Secondary | ICD-10-CM

## 2016-12-01 DIAGNOSIS — C8384 Other non-follicular lymphoma, lymph nodes of axilla and upper limb: Secondary | ICD-10-CM | POA: Diagnosis present

## 2016-12-01 LAB — GLUCOSE, CAPILLARY: Glucose-Capillary: 89 mg/dL (ref 65–99)

## 2016-12-01 MED ORDER — FLUDEOXYGLUCOSE F - 18 (FDG) INJECTION
11.9600 | Freq: Once | INTRAVENOUS | Status: AC | PRN
  Administered 2016-12-01: 11.96 via INTRAVENOUS

## 2016-12-06 ENCOUNTER — Other Ambulatory Visit: Payer: Self-pay | Admitting: *Deleted

## 2016-12-06 DIAGNOSIS — C8384 Other non-follicular lymphoma, lymph nodes of axilla and upper limb: Secondary | ICD-10-CM

## 2016-12-06 DIAGNOSIS — C8304 Small cell B-cell lymphoma, lymph nodes of axilla and upper limb: Secondary | ICD-10-CM

## 2016-12-07 ENCOUNTER — Other Ambulatory Visit: Payer: Medicare Other

## 2016-12-07 ENCOUNTER — Inpatient Hospital Stay: Payer: Medicare Other | Attending: Internal Medicine

## 2016-12-07 ENCOUNTER — Ambulatory Visit: Payer: Medicare Other | Admitting: Internal Medicine

## 2016-12-07 ENCOUNTER — Inpatient Hospital Stay (HOSPITAL_BASED_OUTPATIENT_CLINIC_OR_DEPARTMENT_OTHER): Payer: Medicare Other | Admitting: Internal Medicine

## 2016-12-07 VITALS — BP 142/70 | HR 59 | Temp 97.8°F | Resp 18 | Ht 61.0 in | Wt 117.4 lb

## 2016-12-07 DIAGNOSIS — G2581 Restless legs syndrome: Secondary | ICD-10-CM | POA: Insufficient documentation

## 2016-12-07 DIAGNOSIS — N952 Postmenopausal atrophic vaginitis: Secondary | ICD-10-CM

## 2016-12-07 DIAGNOSIS — M1990 Unspecified osteoarthritis, unspecified site: Secondary | ICD-10-CM | POA: Insufficient documentation

## 2016-12-07 DIAGNOSIS — C8384 Other non-follicular lymphoma, lymph nodes of axilla and upper limb: Secondary | ICD-10-CM | POA: Diagnosis present

## 2016-12-07 DIAGNOSIS — I4891 Unspecified atrial fibrillation: Secondary | ICD-10-CM | POA: Insufficient documentation

## 2016-12-07 DIAGNOSIS — K219 Gastro-esophageal reflux disease without esophagitis: Secondary | ICD-10-CM | POA: Diagnosis not present

## 2016-12-07 DIAGNOSIS — G629 Polyneuropathy, unspecified: Secondary | ICD-10-CM | POA: Insufficient documentation

## 2016-12-07 DIAGNOSIS — R590 Localized enlarged lymph nodes: Secondary | ICD-10-CM

## 2016-12-07 DIAGNOSIS — I5022 Chronic systolic (congestive) heart failure: Secondary | ICD-10-CM | POA: Diagnosis not present

## 2016-12-07 DIAGNOSIS — Z87891 Personal history of nicotine dependence: Secondary | ICD-10-CM

## 2016-12-07 DIAGNOSIS — I2510 Atherosclerotic heart disease of native coronary artery without angina pectoris: Secondary | ICD-10-CM

## 2016-12-07 DIAGNOSIS — Z806 Family history of leukemia: Secondary | ICD-10-CM | POA: Diagnosis not present

## 2016-12-07 DIAGNOSIS — I252 Old myocardial infarction: Secondary | ICD-10-CM | POA: Insufficient documentation

## 2016-12-07 DIAGNOSIS — Z79899 Other long term (current) drug therapy: Secondary | ICD-10-CM | POA: Insufficient documentation

## 2016-12-07 DIAGNOSIS — C8304 Small cell B-cell lymphoma, lymph nodes of axilla and upper limb: Secondary | ICD-10-CM

## 2016-12-07 DIAGNOSIS — E785 Hyperlipidemia, unspecified: Secondary | ICD-10-CM | POA: Insufficient documentation

## 2016-12-07 DIAGNOSIS — Z7982 Long term (current) use of aspirin: Secondary | ICD-10-CM | POA: Insufficient documentation

## 2016-12-07 LAB — CBC WITH DIFFERENTIAL/PLATELET
BASOS ABS: 0.1 10*3/uL (ref 0–0.1)
Basophils Relative: 1 %
EOS PCT: 2 %
Eosinophils Absolute: 0.2 10*3/uL (ref 0–0.7)
HEMATOCRIT: 37.9 % (ref 35.0–47.0)
Hemoglobin: 13.2 g/dL (ref 12.0–16.0)
LYMPHS PCT: 48 %
Lymphs Abs: 4.2 10*3/uL — ABNORMAL HIGH (ref 1.0–3.6)
MCH: 32.7 pg (ref 26.0–34.0)
MCHC: 34.8 g/dL (ref 32.0–36.0)
MCV: 94.1 fL (ref 80.0–100.0)
MONO ABS: 0.5 10*3/uL (ref 0.2–0.9)
MONOS PCT: 6 %
NEUTROS ABS: 3.8 10*3/uL (ref 1.4–6.5)
Neutrophils Relative %: 43 %
Platelets: 149 10*3/uL — ABNORMAL LOW (ref 150–440)
RBC: 4.03 MIL/uL (ref 3.80–5.20)
RDW: 14.2 % (ref 11.5–14.5)
WBC: 8.8 10*3/uL (ref 3.6–11.0)

## 2016-12-07 LAB — LACTATE DEHYDROGENASE: LDH: 117 U/L (ref 98–192)

## 2016-12-07 LAB — COMPREHENSIVE METABOLIC PANEL
ALT: 11 U/L — ABNORMAL LOW (ref 14–54)
ANION GAP: 4 — AB (ref 5–15)
AST: 15 U/L (ref 15–41)
Albumin: 4.1 g/dL (ref 3.5–5.0)
Alkaline Phosphatase: 59 U/L (ref 38–126)
BILIRUBIN TOTAL: 1 mg/dL (ref 0.3–1.2)
BUN: 23 mg/dL — AB (ref 6–20)
CO2: 27 mmol/L (ref 22–32)
Calcium: 9.2 mg/dL (ref 8.9–10.3)
Chloride: 106 mmol/L (ref 101–111)
Creatinine, Ser: 0.94 mg/dL (ref 0.44–1.00)
GFR, EST NON AFRICAN AMERICAN: 56 mL/min — AB (ref >60)
Glucose, Bld: 101 mg/dL — ABNORMAL HIGH (ref 65–99)
POTASSIUM: 4.3 mmol/L (ref 3.5–5.1)
Sodium: 137 mmol/L (ref 135–145)
TOTAL PROTEIN: 6.8 g/dL (ref 6.5–8.1)

## 2016-12-07 NOTE — Progress Notes (Signed)
Patient here for follow-up for h/o lymphoma.  Pt c/o chronic low back pain. Made worse with "sitting for prolong periods, heavy house work and getting up and down from floor."  Described pain as "achy."

## 2016-12-07 NOTE — Assessment & Plan Note (Addendum)
#   LOW Grade lymphoma- B-cell lymphoma. Status post multiple lines of therapy in the past. April 2018- PET scan shows small lymphadenopathy above and below diaphragm. Low FDG activity. No concerns for any obvious progression/ asymptomatic- we will continue surveillance.  #I can Discussed multiple treatment options available for the treatment of low-grade lymphoma at this time including but not limited to prednisone; repeat Rituxan; Gazyva; and ibrutinib etc. discussed pros and cons of each treatment option at length.  # Follow-up in approximately 6 months with labs.  # I reviewed the blood work- with the patient in detail; also reviewed the imaging independently [as summarized above]; and with the patient in detail.

## 2016-12-07 NOTE — Progress Notes (Signed)
[place] OFFICE PROGRESS NOTE  Patient Care Team: [name], MD as PCP - General (Unknown Physician Specialty) [name], MD (General Surgery) Forest Gleason, MD (Oncology) Wellington Hampshire, MD as Consulting Physician (Cardiology)  Cancer Staging No matching staging information was found for the patient.    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;  Rituxan once a week started in January of 2013  # July 2016-RECURRENCE- LYMPH NODE, LEFT AXILLA; EXCISIONAL BIOPSY:  - LOW-GRADE B-CELL LYMPHOMA, IMMUNOPHENOTYPICALLY MOST CONSISTENT WITH CLL/SLL, PET April 2018- STABLE/slight progression  # acute MI in December of 2014; #  upper and lower endoscopy done in May of 2015     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  female pleasant patient above history of Low-grade lymphoma/B cell non-Hodgkin type is here for follow-up accompanied by her son.  Patient is chronic mild back pain not any worse. Patient denies any unusual weight gain or weight loss. Denies any lumps or bumps. No fevers or chills.   REVIEW OF SYSTEMS:  A complete 10 point review of system is done which is negative except mentioned above/history of present illness.   PAST MEDICAL HISTORY :  Past Medical History:  Diagnosis Date  . A-fib (HCC)    A. fib with RVR in the setting of myocardial infarction. Converted to sinus rhythm with amiodarone.  . Atrophic vaginitis   . CHF (congestive heart failure) ([place])   . Chronic systolic heart failure (HCC)     EF was 25-35% post MI but improved to 45-50% in 04/2013  . Coronary artery disease 08/20/12   ST elevation myocardial infarction with late presentation and cardiogenic shock. Cardiac catheterization showed occluded mid LAD and 95% stenosis in proximal RCA. She had 2 drug-eluting stent placements to the mid LAD and one drug-eluting stent placement to the proximal RCA. Ejection fraction was 25%.  [name] Dysrhythmia   . Fibrocystic breast disease   . GERD (gastroesophageal reflux disease)   . Glaucoma   . Glaucoma   . History of gastritis   . Hyperlipidemia   . Hypertension   . Insomnia   . Migraine headache   . Myocardial infarction 08/18/12  . Non Hodgkin's lymphoma ([place]) 2005   reoccurance 2007 and 2012  . Osteoarthritis   . Polyneuropathy ([place])   . Polyposis of colon   . Restless leg syndrome   . Urinary incontinence   . Urinary, incontinence, stress female     PAST SURGICAL HISTORY :   Past Surgical History:  Procedure Laterality Date  . ABDOMINAL HYSTERECTOMY  1956  . APPENDECTOMY    . AXILLARY LYMPH NODE BIOPSY Left 03/18/2015   Procedure: AXILLARY LYMPH NODE BIOPSY;  Surgeon: [name], MD;  Location: [name];  Service: General;  Laterality: Left;  . CARDIAC CATHETERIZATION  08/19/2012   [name]; [place]  . COLONOSCOPY    . COLONOSCOPY WITH PROPOFOL N/A 03/02/2016   Procedure: COLONOSCOPY WITH PROPOFOL;  Surgeon: Manya Silvas, MD;  Location[facility] END[facility]COPY;  Service:  Endoscopy;  Laterality: N/A;  . CORONARY ANGIOPLASTY WITH STENT PLACEMENT     x3 stents  . CORONARY ARTERY BYPASS GRAFT    . ESOPHAGOGASTRODUODENOSCOPY (EGD) WITH PROPOFOL N/A 03/02/2016   Procedure: ESOPHAGOGASTRODUODENOSCOPY (EGD) WITH PROPOFOL;  Surgeon: Manya Silvas, MD;  Location: [facility] ENDOSCOPY;  Service: Endoscopy;  Laterality: N/A;  . LYMPH NODE BIOPSY Right 07-24-11   Dr Bary Castilla  . PTCA    . skin cancer removal    . TOTAL ABDOMINAL HYSTERECTOMY W/ BILATERAL SALPINGOOPHORECTOMY       FAMILY HISTORY :   Family History  Problem Relation Age of Onset  . Heart attack Father   . Heart disease Brother   . Leukemia Mother   . Bladder Cancer Neg Hx   . Prostate cancer Neg Hx   . Kidney cancer Neg Hx     SOCIAL HISTORY:   Social History  Substance Use Topics  . Smoking status: Former Smoker    Types: Cigarettes  . Smokeless tobacco: Never Used  . Alcohol use No    ALLERGIES:  is allergic to ace inhibitors; benadryl [diphenhydramine hcl]; codeine; hydrocodone; and guaifenesin & derivatives.  MEDICATIONS:  Current Outpatient Prescriptions  Medication Sig Dispense Refill  . aspirin 81 MG tablet Take 81 mg by mouth every morning.     . carvedilol (COREG) 3.125 MG tablet Take 1 tablet (3.125 mg total) by mouth 2 (two) times daily with a meal. 60 tablet 6  . Cholecalciferol (VITAMIN D-3) 5000 UNITS TABS Take 2,000 Int'l Units by mouth every morning.     . fexofenadine (ALLEGRA) 180 MG tablet Take 180 mg by mouth daily as needed.    . furosemide (LASIX) 20 MG tablet Take 1 tablet (20 mg total) by mouth daily as needed. 30 tablet 3  . latanoprost (XALATAN) 0.005 % ophthalmic solution Place 1 drop into both eyes at bedtime.     [name] LORazepam (ATIVAN) 0.5 MG tablet Take 0.5 mg by mouth every 8 (eight) hours as needed for anxiety.     [name] losartan (COZAAR) 25 MG tablet Take 0.5 tablets (12.5 mg total) by mouth daily after breakfast. 30 tablet 6  . oxybutynin (DITROPAN-XL) 10 MG 24 hr tablet Take 1 tablet (10 mg total) by mouth daily. 30 tablet 3  . pantoprazole (PROTONIX) 40 MG tablet Take 1 tablet (40 mg total) by mouth 2 (two) times daily. 60 tablet 6  . pregabalin (LYRICA) 50 MG capsule Take 50 mg by mouth at bedtime as needed (restless leg).     . rosuvastatin (CRESTOR) 10 MG tablet TAKE 1 TABLET (10 MG TOTAL) BY MOUTH DAILY. 30 tablet 3  . timolol (TIMOPTIC) 0.5 % ophthalmic solution Place 1 drop into both eyes 2 (two) times daily.     [name] acetaminophen (TYLENOL) 650 MG CR  tablet Take 650 mg by mouth every 8 (eight) hours as needed for pain.     No current facility-administered medications for this visit.     PHYSICAL EXAMINATION: ECOG PERFORMANCE STATUS: 0 - Asymptomatic  BP (!) 142/70 (Patient Position: Sitting)   Pulse (!) 59   Temp 97.8 F (36.6 C) (Tympanic)   Resp 18   Ht 5\' 1"  (1.549 m)   Wt 117 lb 6.4 oz (53.3 kg)   BMI 22.18 kg/m   Filed Weights   12/07/16 1141  Weight: 117 lb 6.4 oz (53.3 kg)    GENERAL: Well-nourished well-developed; Alert, no distress and comfortable.   Accompanied by her son. ES: no pallor  or icterus OROPHARYNX: no thrush or ulceration; good dentition  NECK: supple, no masses felt LYMPH:  no palpable lymphadenopathy in the cervical,  or inguinal regions. Bilateral axillary adenopathy 1-2 cm in size.  LUNGS: clear to auscultation and  No wheeze or crackles HEART/CVS: regular rate & rhythm and no murmurs; No lower extremity edema ABDOMEN:abdomen soft, non-tender and normal bowel sounds Musculoskeletal:no cyanosis of digits and no clubbing  PSYCH: alert & oriented x 3 with fluent speech NEURO: no focal motor/sensory deficits SKIN:  no rashes or significant lesions  LABORATORY DATA:  I have reviewed the data as listed    Component Value Date/Time   NA 137 12/07/2016 1105   NA 141 09/07/2014 1449   K 4.3 12/07/2016 1105   K 4.0 09/07/2014 1449   CL 106 12/07/2016 1105   CL 106 09/07/2014 1449   CO2 27 12/07/2016 1105   CO2 29 09/07/2014 1449   GLUCOSE 101 (H) 12/07/2016 1105   GLUCOSE 109 (H) 09/07/2014 1449   BUN 23 (H) 12/07/2016 1105   BUN 19 (H) 09/07/2014 1449   CREATININE 0.94 12/07/2016 1105   CREATININE 1.01 09/07/2014 1449   CALCIUM 9.2 12/07/2016 1105   CALCIUM 8.8 09/07/2014 1449   PROT 6.8 12/07/2016 1105   PROT 6.3 09/02/2015 0824   PROT 6.5 09/07/2014 1449   ALBUMIN 4.1 12/07/2016 1105   ALBUMIN 4.2 09/02/2015 0824   ALBUMIN 3.8 09/07/2014 1449   AST 15 12/07/2016 1105   AST 11 (L)  09/07/2014 1449   ALT 11 (L) 12/07/2016 1105   ALT 22 09/07/2014 1449   ALKPHOS 59 12/07/2016 1105   ALKPHOS 75 09/07/2014 1449   BILITOT 1.0 12/07/2016 1105   BILITOT 0.4 09/02/2015 0824   BILITOT 0.8 09/07/2014 1449   GFRNONAA 56 (L) 12/07/2016 1105   GFRNONAA 56 (L) 09/07/2014 1449   GFRNONAA 44 (L) 02/09/2014 1351   GFRAA >60 12/07/2016 1105   GFRAA >60 09/07/2014 1449   GFRAA 51 (L) 02/09/2014 1351    No results found for: SPEP, UPEP  Lab Results  Component Value Date   WBC 8.8 12/07/2016   NEUTROABS 3.8 12/07/2016   HGB 13.2 12/07/2016   HCT 37.9 12/07/2016   MCV 94.1 12/07/2016   PLT 149 (L) 12/07/2016      Chemistry      Component Value Date/Time   NA 137 12/07/2016 1105   NA 141 09/07/2014 1449   K 4.3 12/07/2016 1105   K 4.0 09/07/2014 1449   CL 106 12/07/2016 1105   CL 106 09/07/2014 1449   CO2 27 12/07/2016 1105   CO2 29 09/07/2014 1449   BUN 23 (H) 12/07/2016 1105   BUN 19 (H) 09/07/2014 1449   CREATININE 0.94 12/07/2016 1105   CREATININE 1.01 09/07/2014 1449      Component Value Date/Time   CALCIUM 9.2 12/07/2016 1105   CALCIUM 8.8 09/07/2014 1449   ALKPHOS 59 12/07/2016 1105   ALKPHOS 75 09/07/2014 1449   AST 15 12/07/2016 1105   AST 11 (L) 09/07/2014 1449   ALT 11 (L) 12/07/2016 1105   ALT 22 09/07/2014 1449   BILITOT 1.0 12/07/2016 1105   BILITOT 0.4 09/02/2015 0824   BILITOT 0.8 09/07/2014 1449     IMPRESSION: 1. Continued scattered adenopathy in the neck, chest, abdomen, and pelvis, similar to minimally increased in size, and similar to minimally increased in metabolic activity, currently [place] 3 and [place] 4. 2. Other imaging findings of potential clinical significance: Coronary, aortic  arch, and branch vessel atherosclerotic vascular disease. Aortoiliac atherosclerotic vascular disease. Hepatic cysts.   Electronically Signed   By: Van Clines M.D.   On: 12/01/2016 13:49  RADIOGRAPHIC STUDIES: I have  personally reviewed the radiological images as listed and agreed with the findings in the report. No results found.   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 Status post multiple lines of therapy in the past. April 2018- PET scan shows small lymphadenopathy above and below diaphragm. Low FDG activity. No concerns for any obvious progression/ asymptomatic- we will continue surveillance.  #I can Discussed multiple treatment options available for the treatment of low-grade lymphoma at this time including but not limited to prednisone; repeat Rituxan; Gazyva; and ibrutinib etc. discussed pros and cons of each treatment option at length.  # Follow-up in approximately 6 months with labs.  # I reviewed the blood work- with the patient in detail; also reviewed the imaging independently [as summarized above]; and with the patient in detail.    Orders Placed This Encounter  Procedures  . Comprehensive metabolic panel    Standing Status:   Future    Standing Expiration Date:   12/07/2017  . CBC with Differential/Platelet    Standing Status:   Future    Standing Expiration Date:   12/07/2017  . Lactate dehydrogenase    Standing Status:   Future    Standing Expiration Date:   12/07/2017   All questions were answered. The patient knows to call the clinic with any problems, questions or concerns.      Cammie Sickle, MD 12/10/2016 7:30 PM

## 2016-12-12 ENCOUNTER — Encounter: Payer: Self-pay | Admitting: Internal Medicine

## 2017-01-10 ENCOUNTER — Ambulatory Visit: Payer: Medicare Other | Admitting: Urology

## 2017-03-19 ENCOUNTER — Other Ambulatory Visit: Payer: Self-pay | Admitting: Cardiovascular Disease

## 2017-03-23 ENCOUNTER — Telehealth: Payer: Self-pay | Admitting: Cardiovascular Disease

## 2017-03-23 NOTE — Telephone Encounter (Addendum)
She needs to come in to talk to Dr. Fletcher Anon about her meds.  Her last appt was in November 2017.

## 2017-03-23 NOTE — Telephone Encounter (Signed)
Pt states she wakes up during the night, with her mouth "totally dry" Her PCP told her this may be coming from her medication.

## 2017-04-09 ENCOUNTER — Encounter: Payer: Self-pay | Admitting: Cardiovascular Disease

## 2017-04-09 ENCOUNTER — Ambulatory Visit (INDEPENDENT_AMBULATORY_CARE_PROVIDER_SITE_OTHER): Payer: Medicare Other | Admitting: Cardiovascular Disease

## 2017-04-09 VITALS — BP 146/62 | HR 72 | Ht 61.0 in | Wt 111.5 lb

## 2017-04-09 DIAGNOSIS — I209 Angina pectoris, unspecified: Secondary | ICD-10-CM | POA: Diagnosis not present

## 2017-04-09 DIAGNOSIS — I25118 Atherosclerotic heart disease of native coronary artery with other forms of angina pectoris: Secondary | ICD-10-CM

## 2017-04-09 DIAGNOSIS — R0602 Shortness of breath: Secondary | ICD-10-CM | POA: Diagnosis not present

## 2017-04-09 DIAGNOSIS — E785 Hyperlipidemia, unspecified: Secondary | ICD-10-CM

## 2017-04-09 NOTE — Patient Instructions (Addendum)
Medication Instructions:  Your physician recommends that you continue on your current medications as directed. Please refer to the Current Medication list given to you today.   Labwork: none  Testing/Procedures: Your physician has requested that you have an echocardiogram. Echocardiography is a painless test that uses sound waves to create images of your heart. It provides your doctor with information about the size and shape of your heart and how well your heart's chambers and valves are working. This procedure takes approximately one hour. There are no restrictions for this procedure.  Your physician has requested that you have a lexiscan myoview. For further information please visit [email or web address]. Please follow instruction sheet, as given.  [place]  Your caregiver has ordered a Stress Test with nuclear imaging. The purpose of this test is to evaluate the blood supply to your heart muscle. This procedure is referred to as a "Non-Invasive Stress Test." This is because other than having an IV started in your vein, nothing is inserted or "invades" your body. Cardiac stress tests are done to find areas of poor blood flow to the heart by determining the extent of coronary artery disease (CAD). Some patients exercise on a treadmill, which naturally increases the blood flow to your heart, while others who are  unable to walk on a treadmill due to physical limitations have a pharmacologic/chemical stress agent called Lexiscan . This medicine will mimic walking on a treadmill by temporarily increasing your coronary blood flow.   Please note: these test may take anywhere between 2-4 hours to complete  PLEASE REPORT TO [place] AT THE FIRST DESK WILL DIRECT YOU WHERE TO GO  Date of Procedure:_____08/29/18_________  Arrival Time for Procedure:_______07:45 AM_____________  Instructions regarding medication:    _X_:  Hold betablocker (CARVEDILOL) THE night  before procedure and morning of procedure  _X_:  Hold other medications as follows:___________FUROSEMIDE________   PLEASE NOTIFY THE OFFICE AT LEAST 24 HOURS IN ADVANCE IF YOU ARE UNABLE TO KEEP YOUR APPOINTMENT.  [phone] AND  PLEASE NOTIFY NUCLEAR MEDICINE AT [facility] AT LEAST 24 HOURS IN ADVANCE IF YOU ARE UNABLE TO KEEP YOUR APPOINTMENT. [phone]  How to prepare for your Myoview test:  1. Do not eat or drink after midnight 2. No caffeine for 24 hours prior to test 3. No smoking 24 hours prior to test. 4. Your medication may be taken with water.  If your doctor stopped a medication because of this test, do not take that medication. 5. Ladies, please do not wear dresses.  Skirts or pants are appropriate. Please wear a short sleeve shirt. 6. No perfume, cologne or lotion. 7. Wear comfortable walking shoes. No heels!         Follow-Up: Your physician recommends that you schedule a follow-up appointment in: [place].    Cardiac Nuclear Scan A cardiac nuclear scan is a test that measures blood flow to the heart when a person is resting and when he or she is exercising. The test looks for problems such as:  Not enough blood reaching a portion of the heart.  The heart muscle not working normally.  You may need this test if:  You have heart disease.  You have had abnormal lab results.  You have had heart surgery or angioplasty.  You have chest pain.  You have shortness of breath.  In this test, a radioactive dye (tracer) is injected into your bloodstream. After the tracer has traveled to your heart, an  imaging device is used to measure how much of the tracer is absorbed by or distributed to various areas of your heart. This procedure is usually done at a hospital and takes 2-4 hours. Tell a health care provider about:  Any allergies you have.  All medicines you are taking, including vitamins, herbs, eye drops, creams, and over-the-counter  medicines.  Any problems you or family members have had with the use of anesthetic medicines.  Any blood disorders you have.  Any surgeries you have had.  Any medical conditions you have.  Whether you are pregnant or may be pregnant. What are the risks? Generally, this is a safe procedure. However, problems may occur, including:  Serious chest pain and heart attack. This is only a risk if the stress portion of the test is done.  Rapid heartbeat.  Sensation of warmth in your chest. This usually passes quickly.  What happens before the procedure?  Ask your health care provider about changing or stopping your regular medicines. This is especially important if you are taking diabetes medicines or blood thinners.  Remove your jewelry on the day of the procedure. What happens during the procedure?  An IV tube will be inserted into one of your veins.  Your health care provider will inject a small amount of radioactive tracer through the tube.  You will wait for 20-40 minutes while the tracer travels through your bloodstream.  Your heart activity will be monitored with an electrocardiogram (ECG).  You will lie down on an exam table.  Images of your heart will be taken for about 15-20 minutes.  You may be asked to exercise on a treadmill or stationary bike. While you exercise, your heart's activity will be monitored with an ECG, and your blood pressure will be checked. If you are unable to exercise, you may be given a medicine to increase blood flow to parts of your heart.  When blood flow to your heart has peaked, a tracer will again be injected through the IV tube.  After 20-40 minutes, you will get back on the exam table and have more images taken of your heart.  When the procedure is over, your IV tube will be removed. The procedure may vary among health care providers and hospitals. Depending on the type of tracer used, scans may need to be repeated 3-4 hours later. What  happens after the procedure?  Unless your health care provider tells you otherwise, you may return to your normal schedule, including diet, activities, and medicines.  Unless your health care provider tells you otherwise, you may increase your fluid intake. This will help flush the contrast dye from your body. Drink enough fluid to keep your urine clear or pale yellow.  It is up to you to get your test results. Ask your health care provider, or the department that is doing the test, when your results will be ready. Summary  A cardiac nuclear scan measures the blood flow to the heart when a person is resting and when he or she is exercising.  You may need this test if you are at risk for heart disease.  Tell your health care provider if you are pregnant.  Unless your health care provider tells you otherwise, increase your fluid intake. This will help flush the contrast dye from your body. Drink enough fluid to keep your urine clear or pale yellow. This information is not intended to replace advice given to you by your health care provider. Make sure you discuss  any questions you have with your health care provider. Document Released: 09/01/2004 Document Revised: 08/09/2016 Document Reviewed: 07/16/2013 Elsevier Interactive Patient Education  2017 [employer].   Echocardiogram An echocardiogram, or echocardiography, uses sound waves (ultrasound) to produce an image of your heart. The echocardiogram is simple, painless, obtained within a short period of time, and offers valuable information to your health care provider. The images from an echocardiogram can provide information such as:  Evidence of coronary artery disease (CAD).  Heart size.  Heart muscle function.  Heart valve function.  Aneurysm detection.  Evidence of a past heart attack.  Fluid buildup around the heart.  Heart muscle thickening.  Assess heart valve function.  Tell a health care provider about:  Any  allergies you have.  All medicines you are taking, including vitamins, herbs, eye drops, creams, and over-the-counter medicines.  Any problems you or family members have had with anesthetic medicines.  Any blood disorders you have.  Any surgeries you have had.  Any medical conditions you have.  Whether you are pregnant or may be pregnant. What happens before the procedure? No special preparation is needed. Eat and drink normally. What happens during the procedure?  In order to produce an image of your heart, gel will be applied to your chest and a wand-like tool (transducer) will be moved over your chest. The gel will help transmit the sound waves from the transducer. The sound waves will harmlessly bounce off your heart to allow the heart images to be captured in real-time motion. These images will then be recorded.  You may need an IV to receive a medicine that improves the quality of the pictures. What happens after the procedure? You may return to your normal schedule including diet, activities, and medicines, unless your health care provider tells you otherwise. This information is not intended to replace advice given to you by your health care provider. Make sure you discuss any questions you have with your health care provider. Document Released: 08/04/2000 Document Revised: 03/25/2016 Document Reviewed: 04/14/2013 Elsevier Interactive Patient Education  2017 [employer].

## 2017-04-09 NOTE — Progress Notes (Signed)
Cardiology Office Note   Date:  04/09/2017   ID:  Jeanne Pittman, DOB 31-Jul-1935, MRN [id number]  PCP:  [name], MD  Cardiologist:   Kathlyn Sacramento, MD   Chief Complaint  Patient presents with  . other    Patient c/o mouth dryness, no engery and to discuss medications. meds reviewed verbally with patient.       History of Present Illness: Jeanne Pittman is a 82 y.o. female who presents for a followup visit regarding coronary artery disease and chronic systolic heart failure due to ischemic cardiomyopathy. She had anterior ST elevation myocardial infarction in December 2013 with late presentation complicated by cardiogenic shock. Emergent cardiac catheterization showed an occluded mid LAD and 95% stenosis in the proximal RCA. 2 drug-eluting stents were placed in the mid LAD and 1 drug-eluting stent to the proximal RCA. Ejection fraction was 25-30% with akinesis of the mid distal anterior, apical and distal inferior wall.  Most recent ejection fraction was 45-50% in September, 2014. She has known history of non-Hodgkin's lymphoma which is being observed. She has known history of myalgia with atorvastatin but she has been tolerating rosuvastatin.  She is here for evaluation of worsening exertional dyspnea and fatigue. She had also few episodes of substernal chest tightness at rest. She feels that by known time, she has no energy left and she has to sit on the chair and relax. She reports insomnia with inability to sleep even with lorazepam. She denies unusual stress but she might be somewhat depressed.  She also complains of dry mouth over the last few months. She thinks it might be coming from some of her heart medications.  Past Medical History:  Diagnosis Date  . A-fib (HCC)    A. fib with RVR in the setting of myocardial infarction. Converted to sinus rhythm with amiodarone.  . Atrophic vaginitis   . CHF (congestive heart failure) ([place])   . Chronic systolic heart  failure (HCC)    EF was 25-35% post MI but improved to 45-50% in 04/2013  . Coronary artery disease 08/20/12   ST elevation myocardial infarction with late presentation and cardiogenic shock. Cardiac catheterization showed occluded mid LAD and 95% stenosis in proximal RCA. She had 2 drug-eluting stent placements to the mid LAD and one drug-eluting stent placement to the proximal RCA. Ejection fraction was 25%.  [name] Dysrhythmia   . Fibrocystic breast disease   . GERD (gastroesophageal reflux disease)   . Glaucoma   . Glaucoma   . History of gastritis   . Hyperlipidemia   . Hypertension   . Insomnia   . Migraine headache   . Myocardial infarction ([place]) 08/18/12  . Non Hodgkin's lymphoma ([place]) 2005   reoccurance 2007 and 2012  . Osteoarthritis   . Polyneuropathy   . Polyposis of colon   . Restless leg syndrome   . Urinary incontinence   . Urinary, incontinence, stress female     Past Surgical History:  Procedure Laterality Date  . ABDOMINAL HYSTERECTOMY  1956  . APPENDECTOMY    . AXILLARY LYMPH NODE BIOPSY Left 03/18/2015   Procedure: AXILLARY LYMPH NODE BIOPSY;  Surgeon: [name], MD;  Location: [name];  Service: General;  Laterality: Left;  . CARDIAC CATHETERIZATION  08/19/2012   [name]; [facility]  . COLONOSCOPY    . COLONOSCOPY WITH PROPOFOL N/A 03/02/2016   Procedure: COLONOSCOPY WITH PROPOFOL;  Surgeon: Manya Silvas, MD;  Location: [facility] ENDOSCOPY;  Service: Endoscopy;  Laterality: N/A;  . CORONARY ANGIOPLASTY WITH STENT PLACEMENT     x3 stents  . CORONARY ARTERY BYPASS GRAFT    . ESOPHAGOGASTRODUODENOSCOPY (EGD) WITH PROPOFOL N/A 03/02/2016   Procedure: ESOPHAGOGASTRODUODENOSCOPY (EGD) WITH PROPOFOL;  Surgeon: Manya Silvas, MD;  Location: [facility] ENDOSCOPY;  Service: Endoscopy;  Laterality: N/A;  . LYMPH NODE BIOPSY Right 07-24-11   Dr Bary Castilla  . PTCA    . skin cancer removal    . TOTAL ABDOMINAL HYSTERECTOMY W/ BILATERAL SALPINGOOPHORECTOMY       Current  Outpatient Prescriptions  Medication Sig Dispense Refill  . acetaminophen (TYLENOL) 650 MG CR tablet Take 650 mg by mouth every 8 (eight) hours as needed for pain.    [name] aspirin 81 MG tablet Take 81 mg by mouth every morning.     . carvedilol (COREG) 3.125 MG tablet Take 1 tablet (3.125 mg total) by mouth 2 (two) times daily with a meal. 60 tablet 6  . Cholecalciferol (VITAMIN D-3) 5000 UNITS TABS Take 2,000 Int'l Units by mouth every morning.     . fexofenadine (ALLEGRA) 180 MG tablet Take 180 mg by mouth daily as needed.    . furosemide (LASIX) 20 MG tablet Take 1 tablet (20 mg total) by mouth daily as needed. 30 tablet 3  . latanoprost (XALATAN) 0.005 % ophthalmic solution Place 1 drop into both eyes at bedtime.     [name] LORazepam (ATIVAN) 0.5 MG tablet Take 0.5 mg by mouth every 8 (eight) hours as needed for anxiety.     [name] losartan (COZAAR) 25 MG tablet Take 0.5 tablets (12.5 mg total) by mouth daily after breakfast. 30 tablet 6  . oxybutynin (DITROPAN-XL) 10 MG 24 hr tablet Take 1 tablet (10 mg total) by mouth daily. 30 tablet 3  . pantoprazole (PROTONIX) 40 MG tablet Take 1 tablet (40 mg total) by mouth 2 (two) times daily. 60 tablet 6  . pregabalin (LYRICA) 50 MG capsule Take 50 mg by mouth at bedtime as needed (restless leg).     . rosuvastatin (CRESTOR) 10 MG tablet TAKE 1 TABLET(10 MG) BY MOUTH DAILY 30 tablet 0  . timolol (TIMOPTIC) 0.5 % ophthalmic solution Place 1 drop into both eyes 2 (two) times daily.      No current facility-administered medications for this visit.     Allergies:   Ace inhibitors; Benadryl [diphenhydramine hcl]; Codeine; Hydrocodone; and Guaifenesin & derivatives    Social History:  The patient  reports that she has quit smoking. Her smoking use included Cigarettes. She has never used smokeless tobacco. She reports that she does not drink alcohol or use drugs.   Family History:  The patient's family history includes Heart attack in her father; Heart disease in  her brother; Leukemia in her mother.    ROS:  Please see the history of present illness.   Otherwise, review of systems are positive for none.   All other systems are reviewed and negative.    PHYSICAL EXAM: VS:  BP (!) 146/62 (BP Location: Left Arm, Patient Position: Sitting, Cuff Size: Small)   Ht 5\' 1"  (1.549 m)   Wt 111 lb 8 oz (50.6 kg)   BMI 21.07 kg/m  , BMI Body mass index is 21.07 kg/m. GEN: Well nourished, well developed, in no acute distress  HEENT: normal  Neck: no JVD, carotid bruits, or masses Cardiac: RRR; no murmurs, rubs, or gallops,no edema  Respiratory:  clear to auscultation bilaterally, normal work of breathing GI: soft, nontender, nondistended, +  BS MS: no deformity or atrophy  Skin: warm and dry, no rash Neuro:  Strength and sensation are intact Psych: euthymic mood, full affect   EKG:  EKG is ordered today. The ekg ordered today demonstrates normal sinus rhythm with possible old anterior infarct . LVH with repolarization abnormalities.  Recent Labs: 12/07/2016: ALT 11; BUN 23; Creatinine, Ser 0.94; Hemoglobin 13.2; Platelets 149; Potassium 4.3; Sodium 137    Lipid Panel    Component Value Date/Time   CHOL 172 09/02/2015 0824   TRIG 111 09/02/2015 0824   HDL 51 09/02/2015 0824   CHOLHDL 3.4 09/02/2015 0824   LDLCALC 99 09/02/2015 0824      Wt Readings from Last 3 Encounters:  04/09/17 111 lb 8 oz (50.6 kg)  12/07/16 117 lb 6.4 oz (53.3 kg)  10/10/16 118 lb (53.5 kg)        ASSESSMENT AND PLAN:  1.  Coronary artery disease involving native coronary arteries With other forms of angina:  She reports intermittent episodes of substernal chest tightness at rest and worsening exertional dyspnea and fatigue. I think we have to exclude angina equivalent. Thus, I requested a pharmacologic nuclear stress test for evaluation. She is not able to exercise on a treadmill. I also requested an echocardiogram to evaluate diastolic function and pulmonary  pressure.  2. Hyperlipidemia: She is tolerating rosuvastatin with LDL of 70.  3. Ischemic cardiomyopathy: Most recent ejection fraction was 45 to 50 %. Continue low-dose carvedilol and losartan. She is euvolemic.  4. Dry mouth: I explained to her that none of her cardiac medications cause that. The most likely culprit is Oxybutynin which she started in March for hyperactive bladder.   5. Possible depression: Her affect seems to be flat and this might be contributing to some of her physical manifestations.   Disposition:   FU with me in one month.  Signed,  Kathlyn Sacramento, MD  04/09/2017 3:40 PM    [facility]

## 2017-04-17 ENCOUNTER — Ambulatory Visit (INDEPENDENT_AMBULATORY_CARE_PROVIDER_SITE_OTHER): Payer: Medicare Other

## 2017-04-17 ENCOUNTER — Other Ambulatory Visit: Payer: Self-pay

## 2017-04-17 DIAGNOSIS — R0602 Shortness of breath: Secondary | ICD-10-CM

## 2017-04-18 ENCOUNTER — Encounter
Admission: RE | Admit: 2017-04-18 | Discharge: 2017-04-18 | Disposition: A | Discharge status: Home / Self Care | Payer: Medicare Other | Source: Ambulatory Visit | Attending: Cardiovascular Disease | Admitting: Cardiovascular Disease

## 2017-04-18 DIAGNOSIS — R0602 Shortness of breath: Secondary | ICD-10-CM | POA: Insufficient documentation

## 2017-04-18 LAB — NM MYOCAR MULTI W/SPECT W/WALL MOTION / EF
CHL CUP NUCLEAR SDS: 1
CHL CUP STRESS STAGE 1 GRADE: 0 %
CHL CUP STRESS STAGE 1 HR: 62 {beats}/min
CHL CUP STRESS STAGE 2 GRADE: 0 %
CHL CUP STRESS STAGE 2 HR: 63 {beats}/min
CHL CUP STRESS STAGE 3 GRADE: 0 %
CHL CUP STRESS STAGE 4 GRADE: 0 %
CHL CUP STRESS STAGE 4 HR: 80 {beats}/min
CHL CUP STRESS STAGE 4 SBP: 124 mmHg
CHL CUP STRESS STAGE 4 SPEED: 0 mph
Estimated workload: 1 METS
LV dias vol: 70 mL (ref 46–106)
LVSYSVOL: 28 mL
NUC STRESS TID: 1.06
Peak HR: 81 {beats}/min
Percent HR: 66 %
Percent of predicted max HR: 57 %
Rest HR: 64 {beats}/min
SRS: 24
SSS: 19
Stage 1 Speed: 0 mph
Stage 2 Speed: 0 mph
Stage 3 HR: 81 {beats}/min
Stage 3 Speed: 0 mph
Stage 4 DBP: 57 mmHg

## 2017-04-18 MED ORDER — TECHNETIUM TC 99M TETROFOSMIN IV KIT
13.0000 | PACK | Freq: Once | INTRAVENOUS | Status: AC | PRN
  Administered 2017-04-18: 12.483 via INTRAVENOUS

## 2017-04-18 MED ORDER — TECHNETIUM TC 99M TETROFOSMIN IV KIT
28.8540 | PACK | Freq: Once | INTRAVENOUS | Status: AC | PRN
  Administered 2017-04-18: 28.854 via INTRAVENOUS

## 2017-04-18 MED ORDER — REGADENOSON 0.4 MG/5ML IV SOLN
0.4000 mg | Freq: Once | INTRAVENOUS | Status: AC
  Administered 2017-04-18: 0.4 mg via INTRAVENOUS

## 2017-05-10 ENCOUNTER — Encounter: Payer: Self-pay | Admitting: Nurse Practitioner

## 2017-05-10 ENCOUNTER — Ambulatory Visit (INDEPENDENT_AMBULATORY_CARE_PROVIDER_SITE_OTHER): Payer: Medicare Other | Admitting: Nurse Practitioner

## 2017-05-10 VITALS — BP 124/60 | HR 61 | Ht 61.0 in | Wt 113.5 lb

## 2017-05-10 DIAGNOSIS — Z23 Encounter for immunization: Secondary | ICD-10-CM

## 2017-05-10 DIAGNOSIS — I5042 Chronic combined systolic (congestive) and diastolic (congestive) heart failure: Secondary | ICD-10-CM | POA: Diagnosis not present

## 2017-05-10 DIAGNOSIS — I2510 Atherosclerotic heart disease of native coronary artery without angina pectoris: Secondary | ICD-10-CM | POA: Diagnosis not present

## 2017-05-10 DIAGNOSIS — E785 Hyperlipidemia, unspecified: Secondary | ICD-10-CM

## 2017-05-10 DIAGNOSIS — I10 Essential (primary) hypertension: Secondary | ICD-10-CM | POA: Diagnosis not present

## 2017-05-10 DIAGNOSIS — I255 Ischemic cardiomyopathy: Secondary | ICD-10-CM

## 2017-05-10 NOTE — Patient Instructions (Signed)
Medication Instructions:  Your physician recommends that you continue on your current medications as directed. Please refer to the Current Medication list given to you today.   Labwork: none  Testing/Procedures: none  Follow-Up: Your physician wants you to follow-up in: 4-6 months with Dr. Fletcher Anon.  You will receive a reminder letter in the mail two months in advance. If you don't receive a letter, please call our office to schedule the follow-up appointment.   Any Other Special Instructions Will Be Listed Below (If Applicable).     If you need a refill on your cardiac medications before your next appointment, please call your pharmacy.

## 2017-05-10 NOTE — Progress Notes (Signed)
Office Visit    Patient Name: Jeanne Pittman Date of Encounter: 05/10/2017  Primary Care Provider:  [name], MD Primary Cardiologist:  Jerilynn Mages. Fletcher Anon, MD   Chief Complaint    82 year old female with a history of CAD status post myocardial infarction and stenting of LAD and right coronary artery 2013, hypertension, hyperlipidemia, ischemic cardiomyopathy, and non-Hodgkin's lymphoma, who presents for follow-up after recent testing related to fatigue.   Past Medical History    Past Medical History:  Diagnosis Date  . A-fib ([place])    07/2012: A. fib with RVR in the setting of myocardial infarction. Converted to sinus rhythm with amiodarone. No recurrence.  . Atrophic vaginitis   . Chronic combined systolic (congestive) and diastolic (congestive) heart failure ([place])    a. EF was 25-35% post MI but improved to 45-50% in 04/2013; b. 03/2017 Echo: EF 40-45%, mod focal/basal hypertrophy of septum. Sev mid-apicalanteroseptal, ant, and apical HK. Gr1 DD, mild AI/MR, mod TR, PASP 63mHg.  [name] Coronary artery disease    a. 34/1937 STEMI complicated by cardiogenic shock. Cath/PCI: LAD 160m (DESx2), RCA 95p(DES). EF 25%; b. 03/2017 MV: EF 57%, fixed apical, periapical, mid to distal anteroseptal defect. No ischemia.  . Fibrocystic breast disease   . GERD (gastroesophageal reflux disease)   . Glaucoma   . History of gastritis   . Hyperlipidemia   . Hypertension   . Insomnia   . Ischemic cardiomyopathy    a. 07/2012 EF 25-35% following MI-->Improved to 45-50%;  b. 03/2017 Echo: EF 40-45%.  . Migraine headache   . Myocardial infarction ([place]) 08/18/12  . Non Hodgkin's lymphoma ([place]) 2005   reoccurance 2007 and 2012  . Osteoarthritis   . Polyneuropathy   . Polyposis of colon   . Restless leg syndrome   . Urinary, incontinence, stress female    Past Surgical History:  Procedure Laterality Date  . ABDOMINAL HYSTERECTOMY  1956  . APPENDECTOMY    . AXILLARY LYMPH NODE BIOPSY Left 03/18/2015     Procedure: AXILLARY LYMPH NODE BIOPSY;  Surgeon: [name], MD;  Location: [name];  Service: General;  Laterality: Left;  . CARDIAC CATHETERIZATION  08/19/2012   [name]; [place]  . COLONOSCOPY    . COLONOSCOPY WITH PROPOFOL N/A 03/02/2016   Procedure: COLONOSCOPY WITH PROPOFOL;  Surgeon: Manya Silvas, MD;  Location: [facility] ENDOSCOPY;  Service: Endoscopy;  Laterality: N/A;  . CORONARY ANGIOPLASTY WITH STENT PLACEMENT     x3 stents  . CORONARY ARTERY BYPASS GRAFT    . ESOPHAGOGASTRODUODENOSCOPY (EGD) WITH PROPOFOL N/A 03/02/2016   Procedure: ESOPHAGOGASTRODUODENOSCOPY (EGD) WITH PROPOFOL;  Surgeon: Manya Silvas, MD;  Location: [facility] ENDOSCOPY;  Service: Endoscopy;  Laterality: N/A;  . LYMPH NODE BIOPSY Right 07-24-11   Dr Bary Castilla  . PTCA    . skin cancer removal    . TOTAL ABDOMINAL HYSTERECTOMY W/ BILATERAL SALPINGOOPHORECTOMY      Allergies  Allergies  Allergen Reactions  . Ace Inhibitors   . Benadryl [Diphenhydramine Hcl]   . Codeine   . Hydrocodone   . Guaifenesin & Derivatives Rash    History of Present Illness    82 year old female with the above complex past medical history including coronary artery disease status post anterior ST segment elevation myocardial infarction in December 2013, competent by cardiogenic shock requiring 2 drug-eluting stents to LAD and a drug-eluting stent to the right coronary artery. EF was initially depressed at 25-30% but subsequently recovered by echo in September 2014, showing  EF 45-50%. Other history includes hypertension, hyperlipidemia, GERD, and lymphoma. She was recently evaluated by Dr. Fletcher Anon with complaints of fatigue and intermittent indigestion-like chest discomfort. Repeat echocardiography was undertaken and showed an EF of 40-45% without any significant valvular disease. She did have pulmonary hypertension with a pulmonary artery systolic pressure of 40 mmHg. Stress testing was also undertaken and did show a large area of prior  infarct but no evidence of ischemia. In that setting, continued medical therapy was recommended.   Since her last visit, Mrs. Scheib says that she continues to have fatigue, especially later in the day. Her energy levels are generally pretty good in the morning. On further questioning, she says that it's been this way for greater than 1 year and that symptoms haven't necessarily changed recently. Her indigestion symptoms are most likely it to occur after a large evening meal. They do not typically occur otherwise. She is on chronic Protonix and says that it worked for a long time but she isn't sure that is working now. She does have follow-up with gastroenterology. She denies dyspnea on exertion, PND, orthopnea, dizziness, syncope, edema, or early satiety.   Home Medications    Prior to Admission medications   Medication Sig Start Date End Date Taking? Authorizing Provider  acetaminophen (TYLENOL) 650 MG CR tablet Take 650 mg by mouth every 8 (eight) hours as needed for pain.   Yes [provider]  aspirin 81 MG tablet Take 81 mg by mouth every morning.    Yes [provider]  carvedilol (COREG) 3.125 MG tablet Take 1 tablet (3.125 mg total) by mouth 2 (two) times daily with a meal. 08/28/16  Yes Wellington Hampshire, MD  Cholecalciferol (VITAMIN D-3) 5000 UNITS TABS Take 2,000 Int'l Units by mouth every morning.    Yes [provider]  fexofenadine (ALLEGRA) 180 MG tablet Take 180 mg by mouth daily as needed.   Yes [provider]  furosemide (LASIX) 20 MG tablet Take 1 tablet (20 mg total) by mouth daily as needed. 05/29/14  Yes Wellington Hampshire, MD  latanoprost (XALATAN) 0.005 % ophthalmic solution Place 1 drop into both eyes at bedtime.  07/15/14  Yes [provider]  LORazepam (ATIVAN) 0.5 MG tablet Take 0.5 mg by mouth every 8 (eight) hours as needed for anxiety.    Yes [provider]  losartan (COZAAR) 25 MG tablet Take 0.5 tablets (12.5 mg  total) by mouth daily after breakfast. 09/18/16  Yes Wellington Hampshire, MD  oxybutynin (DITROPAN-XL) 10 MG 24 hr tablet Take 1 tablet (10 mg total) by mouth daily. 10/10/16  Yes Hollice Espy, MD  pantoprazole (PROTONIX) 40 MG tablet Take 1 tablet (40 mg total) by mouth 2 (two) times daily. 08/28/16  Yes Wellington Hampshire, MD  pregabalin (LYRICA) 50 MG capsule Take 50 mg by mouth at bedtime as needed (restless leg).    Yes [provider]  rosuvastatin (CRESTOR) 10 MG tablet TAKE 1 TABLET(10 MG) BY MOUTH DAILY 03/19/17  Yes Wellington Hampshire, MD  timolol (TIMOPTIC) 0.5 % ophthalmic solution Place 1 drop into both eyes 2 (two) times daily.  06/15/14  Yes [provider]    Review of Systems    As outlined above, she continues to feel afternoon and evening fatigue though her energy levels are good in the morning. She does have indigestion-like symptoms after a large evening meal and thus she tries to make smaller evening meals. She denies dyspnea, PND, orthopnea, dizziness,  syncope, edema, or early satiety. All other systems reviewed and are otherwise negative except as noted above.  Physical Exam    VS:  BP 124/60 (BP Location: Left Arm, Patient Position: Sitting, Cuff Size: Normal)   Pulse 61   Ht 5\' 1"  (1.549 m)   Wt 113 lb 8 oz (51.5 kg)   BMI 21.45 kg/m  , BMI Body mass index is 21.45 kg/m. GEN: Well nourished, well developed, in no acute distress.  HEENT: normal.  Neck: Supple, no JVD, carotid bruits, or masses. Cardiac: RRR, no murmurs, rubs, or gallops. No clubbing, cyanosis, edema.  Radials/DP/PT 2+ and equal bilaterally.  Respiratory:  Respirations regular and unlabored, clear to auscultation bilaterally. GI: Soft, nontender, nondistended, BS + x 4. MS: no deformity or atrophy. Skin: warm and dry, no rash. Neuro:  Strength and sensation are intact. Psych: Normal affect.  Accessory Clinical Findings    Recent echocardiogram and stress test reviewed with patient  in detail and updated in the past medical history above.   Assessment & Plan    1.  Coronary artery disease: Status post prior anterior ST elevation MI in December 2013 with stenting of the LAD and right coronary artery. She recently complained of intermittent chest discomfort, which now she further describes as occurring generally only after eating a large evening meal. She considers the symptoms to be similar to prior episodes of indigestion. Regardless, stress testing was undertaken in August and did not show any evidence of ischemia. She remains on aspirin, beta blocker, ARB, and statin therapy.    2. Ischemic cardiomyopathy/chronic combined systolic and diastolic heart failure: Patient is euvolemic on exam her weight has been stable for some time. She does not have any significant dyspnea on exertion. Recent echocardiogram shows stable LV dysfunction, with an EF of 40-45%. She remains on beta blocker and ARB therapy. She has Lasix prescribed on an as-needed basis, and she has not used this recently.  3. Essential hypertension: Blood pressure stable today on beta blocker and ARB therapy.  4. Hyperlipidemia: She remains on statin therapy. LDL was 68 in February 2018.  5. Disposition: Patient will follow up with Dr. Fletcher Anon in 3 months or sooner if necessary.   Murray Hodgkins, NP 05/10/2017, 4:57 PM

## 2017-05-28 ENCOUNTER — Other Ambulatory Visit: Payer: Self-pay | Admitting: Cardiovascular Disease

## 2017-06-07 ENCOUNTER — Inpatient Hospital Stay (HOSPITAL_BASED_OUTPATIENT_CLINIC_OR_DEPARTMENT_OTHER): Payer: Medicare Other | Admitting: Internal Medicine

## 2017-06-07 ENCOUNTER — Inpatient Hospital Stay: Payer: Medicare Other | Attending: Internal Medicine

## 2017-06-07 ENCOUNTER — Other Ambulatory Visit: Payer: Self-pay

## 2017-06-07 VITALS — BP 145/70 | HR 65 | Temp 97.6°F | Resp 16 | Ht 61.0 in | Wt 111.2 lb

## 2017-06-07 DIAGNOSIS — Z9221 Personal history of antineoplastic chemotherapy: Secondary | ICD-10-CM

## 2017-06-07 DIAGNOSIS — Z79899 Other long term (current) drug therapy: Secondary | ICD-10-CM | POA: Diagnosis not present

## 2017-06-07 DIAGNOSIS — R5383 Other fatigue: Secondary | ICD-10-CM

## 2017-06-07 DIAGNOSIS — K219 Gastro-esophageal reflux disease without esophagitis: Secondary | ICD-10-CM | POA: Insufficient documentation

## 2017-06-07 DIAGNOSIS — E785 Hyperlipidemia, unspecified: Secondary | ICD-10-CM | POA: Insufficient documentation

## 2017-06-07 DIAGNOSIS — N952 Postmenopausal atrophic vaginitis: Secondary | ICD-10-CM | POA: Diagnosis not present

## 2017-06-07 DIAGNOSIS — M1990 Unspecified osteoarthritis, unspecified site: Secondary | ICD-10-CM | POA: Diagnosis not present

## 2017-06-07 DIAGNOSIS — Z87891 Personal history of nicotine dependence: Secondary | ICD-10-CM | POA: Insufficient documentation

## 2017-06-07 DIAGNOSIS — I700 Atherosclerosis of aorta: Secondary | ICD-10-CM | POA: Diagnosis not present

## 2017-06-07 DIAGNOSIS — I255 Ischemic cardiomyopathy: Secondary | ICD-10-CM

## 2017-06-07 DIAGNOSIS — I2510 Atherosclerotic heart disease of native coronary artery without angina pectoris: Secondary | ICD-10-CM | POA: Diagnosis not present

## 2017-06-07 DIAGNOSIS — K769 Liver disease, unspecified: Secondary | ICD-10-CM | POA: Insufficient documentation

## 2017-06-07 DIAGNOSIS — Z7982 Long term (current) use of aspirin: Secondary | ICD-10-CM

## 2017-06-07 DIAGNOSIS — M549 Dorsalgia, unspecified: Secondary | ICD-10-CM | POA: Diagnosis not present

## 2017-06-07 DIAGNOSIS — Z806 Family history of leukemia: Secondary | ICD-10-CM

## 2017-06-07 DIAGNOSIS — I252 Old myocardial infarction: Secondary | ICD-10-CM | POA: Insufficient documentation

## 2017-06-07 DIAGNOSIS — C8334 Diffuse large B-cell lymphoma, lymph nodes of axilla and upper limb: Secondary | ICD-10-CM | POA: Diagnosis present

## 2017-06-07 DIAGNOSIS — C8304 Small cell B-cell lymphoma, lymph nodes of axilla and upper limb: Secondary | ICD-10-CM

## 2017-06-07 DIAGNOSIS — G2581 Restless legs syndrome: Secondary | ICD-10-CM | POA: Diagnosis not present

## 2017-06-07 DIAGNOSIS — N393 Stress incontinence (female) (male): Secondary | ICD-10-CM | POA: Insufficient documentation

## 2017-06-07 DIAGNOSIS — C8384 Other non-follicular lymphoma, lymph nodes of axilla and upper limb: Secondary | ICD-10-CM

## 2017-06-07 DIAGNOSIS — I10 Essential (primary) hypertension: Secondary | ICD-10-CM | POA: Insufficient documentation

## 2017-06-07 DIAGNOSIS — D696 Thrombocytopenia, unspecified: Secondary | ICD-10-CM | POA: Insufficient documentation

## 2017-06-07 DIAGNOSIS — G8929 Other chronic pain: Secondary | ICD-10-CM | POA: Insufficient documentation

## 2017-06-07 DIAGNOSIS — I5042 Chronic combined systolic (congestive) and diastolic (congestive) heart failure: Secondary | ICD-10-CM | POA: Diagnosis not present

## 2017-06-07 DIAGNOSIS — I4891 Unspecified atrial fibrillation: Secondary | ICD-10-CM | POA: Insufficient documentation

## 2017-06-07 DIAGNOSIS — G629 Polyneuropathy, unspecified: Secondary | ICD-10-CM

## 2017-06-07 DIAGNOSIS — G4700 Insomnia, unspecified: Secondary | ICD-10-CM | POA: Insufficient documentation

## 2017-06-07 LAB — CBC WITH DIFFERENTIAL/PLATELET
BASOS ABS: 0.1 10*3/uL (ref 0–0.1)
Basophils Relative: 1 %
EOS PCT: 2 %
Eosinophils Absolute: 0.1 10*3/uL (ref 0–0.7)
HCT: 39 % (ref 35.0–47.0)
HEMOGLOBIN: 13.5 g/dL (ref 12.0–16.0)
LYMPHS ABS: 4.5 10*3/uL — AB (ref 1.0–3.6)
LYMPHS PCT: 54 %
MCH: 33 pg (ref 26.0–34.0)
MCHC: 34.6 g/dL (ref 32.0–36.0)
MCV: 95.4 fL (ref 80.0–100.0)
MONO ABS: 0.4 10*3/uL (ref 0.2–0.9)
MONOS PCT: 5 %
NEUTROS ABS: 3.1 10*3/uL (ref 1.4–6.5)
Neutrophils Relative %: 38 %
PLATELETS: 146 10*3/uL — AB (ref 150–440)
RBC: 4.09 MIL/uL (ref 3.80–5.20)
RDW: 14.1 % (ref 11.5–14.5)
WBC: 8.2 10*3/uL (ref 3.6–11.0)

## 2017-06-07 LAB — COMPREHENSIVE METABOLIC PANEL
ALBUMIN: 4.3 g/dL (ref 3.5–5.0)
ALT: 11 U/L — ABNORMAL LOW (ref 14–54)
ANION GAP: 11 (ref 5–15)
AST: 18 U/L (ref 15–41)
Alkaline Phosphatase: 59 U/L (ref 38–126)
BILIRUBIN TOTAL: 1.1 mg/dL (ref 0.3–1.2)
BUN: 20 mg/dL (ref 6–20)
CO2: 24 mmol/L (ref 22–32)
Calcium: 9.4 mg/dL (ref 8.9–10.3)
Chloride: 103 mmol/L (ref 101–111)
Creatinine, Ser: 0.89 mg/dL (ref 0.44–1.00)
GFR calc Af Amer: >60 mL/min (ref >60)
GFR calc non Af Amer: 60 mL/min — ABNORMAL LOW (ref >60)
GLUCOSE: 118 mg/dL — AB (ref 65–99)
POTASSIUM: 4.1 mmol/L (ref 3.5–5.1)
SODIUM: 138 mmol/L (ref 135–145)
Total Protein: 7.3 g/dL (ref 6.5–8.1)

## 2017-06-07 LAB — LACTATE DEHYDROGENASE: LDH: 126 U/L (ref 98–192)

## 2017-06-07 LAB — TSH: TSH: 2.978 u[IU]/mL (ref 0.350–4.500)

## 2017-06-07 NOTE — Assessment & Plan Note (Addendum)
#   LOW Grade lymphoma- B-cell lymphoma. Status post multiple lines of therapy in the past. April 2018- PET scan shows small lymphadenopathy above and below diaphragm. Low FDG activity.   #I again discussed multiple treatment options available for the treatment of low-grade lymphoma at this time including but not limited to prednisone; repeat Rituxan; Gazyva; and ibrutinib etc. discussed pros and cons of each treatment option at length. Given the absence of any symptoms I would recommend continued surveillance at this time.  # Fatigue-? Etiology; check TSH.   # Mild thrombocytopenia- monitor for now.  # Follow-up in approximately 6 months with labs;PET scan prior.  will call if TSH is abnormal-  [phone]

## 2017-06-07 NOTE — Progress Notes (Signed)
Patient here for follow-up for h/o lymphoma.  Pt c/o chronic low back pain. Made worse with "sitting for prolong periods, heavy house work and getting up and down from floor."  Described pain as "achy."  [place] NOTE  Patient Care Team: [name], MD as PCP - General (Unknown Physician Specialty) Bary Castilla, Forest Gleason, MD (General Surgery) Forest Gleason, MD (Oncology) Wellington Hampshire, MD as Consulting Physician (Cardiology)  Cancer Staging No matching staging information was found for the patient.    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;  Rituxan once a week started in January of 2013  # July 2016-RECURRENCE- LYMPH NODE, LEFT AXILLA; EXCISIONAL BIOPSY:  - LOW-GRADE B-CELL LYMPHOMA, IMMUNOPHENOTYPICALLY MOST CONSISTENT WITH CLL/SLL, PET April 2018- STABLE/slight progression  # acute MI in December of 2014; #  upper and lower endoscopy done in May of 2015     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  female pleasant patient above history of Low-grade lymphoma/B cell non-Hodgkin type is here for follow-up accompanied by her son.  Patient is chronic mild back pain not any worse. Patient denies any unusual weight gain or weight loss. Denies any lumps or bumps. No fevers or chills.   REVIEW OF SYSTEMS:  A complete 10 point review of system is done which is negative except mentioned above/history of present illness.   PAST MEDICAL HISTORY :  Past Medical History:  Diagnosis Date  . A-fib ([place])     07/2012: A. fib with RVR in the setting of myocardial infarction. Converted to sinus rhythm with amiodarone. No recurrence.  . Atrophic vaginitis   . Chronic combined systolic (congestive) and diastolic (congestive) heart failure ([place])    a. EF was 25-35% post MI but improved to 45-50% in 04/2013; b. 03/2017 Echo: EF 40-45%, mod focal/basal hypertrophy of septum. Sev mid-apicalanteroseptal, ant, and apical HK. Gr1 DD, mild AI/MR, mod TR, PASP 41mHg.  [name] Coronary artery disease    a. 40/9811 STEMI complicated by cardiogenic shock. Cath/PCI: LAD 142m (DESx2), RCA 95p(DES). EF 25%; b. 03/2017 MV: EF 57%, fixed apical, periapical, mid to distal anteroseptal defect. No ischemia.  . Fibrocystic breast disease   . GERD (gastroesophageal reflux disease)   . Glaucoma   . History of gastritis   . Hyperlipidemia   . Hypertension   . Insomnia   . Ischemic cardiomyopathy    a. 07/2012 EF 25-35% following MI-->Improved to 45-50%;  b. 03/2017 Echo: EF 40-45%.  . Migraine headache   . Myocardial infarction ([place]) 08/18/12  . Non Hodgkin's lymphoma ([place]) 2005   reoccurance 2007 and 2012  . Osteoarthritis   . Polyneuropathy   . Polyposis of colon   . Restless leg syndrome   . Urinary, incontinence, stress female     PAST SURGICAL HISTORY :   Past Surgical History:  Procedure Laterality Date  . ABDOMINAL HYSTERECTOMY  1956  . APPENDECTOMY    . AXILLARY LYMPH NODE BIOPSY Left 03/18/2015   Procedure: AXILLARY LYMPH NODE BIOPSY;  Surgeon: [name], MD;  Location: [name];  Service: General;  Laterality: Left;  . CARDIAC CATHETERIZATION  08/19/2012   [name]; [place]  . COLONOSCOPY    . COLONOSCOPY WITH PROPOFOL N/A 03/02/2016   Procedure: COLONOSCOPY WITH PROPOFOL;  Surgeon: Manya Silvas, MD;  Location: [facility] ENDOSCOPY;  Service: Endoscopy;  Laterality: N/A;  . CORONARY ANGIOPLASTY WITH STENT PLACEMENT     x3 stents  . CORONARY ARTERY BYPASS GRAFT    . ESOPHAGOGASTRODUODENOSCOPY (EGD) WITH  PROPOFOL N/A 03/02/2016   Procedure: ESOPHAGOGASTRODUODENOSCOPY (EGD) WITH PROPOFOL;  Surgeon: Manya Silvas, MD;  Location: [facility] ENDOSCOPY;  Service: Endoscopy;  Laterality: N/A;  . LYMPH NODE BIOPSY Right 07-24-11   Dr Bary Castilla  . PTCA    . skin cancer removal    . TOTAL ABDOMINAL HYSTERECTOMY W/ BILATERAL SALPINGOOPHORECTOMY      FAMILY HISTORY :   Family History  Problem Relation Age of Onset  . Heart attack Father   . Heart disease Brother   . Leukemia Mother   . Bladder Cancer Neg Hx   . Prostate cancer Neg Hx   . Kidney cancer Neg Hx     SOCIAL HISTORY:   Social History  Substance Use Topics  . Smoking status: Former Smoker    Types: Cigarettes  . Smokeless tobacco: Never Used  . Alcohol use No    ALLERGIES:  is allergic to ace inhibitors; benadryl [diphenhydramine hcl]; codeine; hydrocodone; and guaifenesin & derivatives.  MEDICATIONS:  Current Outpatient Prescriptions  Medication Sig Dispense Refill  . acetaminophen (TYLENOL) 650 MG CR tablet Take 650 mg by mouth every 8 (eight) hours as needed for pain.    [name] aspirin 81 MG tablet Take 81 mg by mouth every morning.     . carvedilol (COREG) 3.125 MG tablet Take 1 tablet (3.125 mg total) by mouth 2 (two) times daily with a meal. 60 tablet 6  . Cholecalciferol (VITAMIN D-3) 5000 UNITS TABS Take 2,000 Int'l Units by mouth every morning.     . latanoprost (XALATAN) 0.005 % ophthalmic solution Place 1 drop into both eyes at bedtime.     [name] LORazepam (ATIVAN) 0.5 MG tablet Take 0.5 mg by mouth every 8 (eight) hours as needed for anxiety.     [name] losartan (COZAAR) 25 MG tablet Take 0.5 tablets (12.5 mg total) by mouth daily after breakfast. 30 tablet 6  . oxybutynin (DITROPAN-XL) 10 MG 24 hr tablet Take 1 tablet (10 mg total) by mouth daily. (Patient taking differently: Take 10 mg by mouth daily. ) 30 tablet 3  . pantoprazole (PROTONIX) 40 MG tablet Take 1 tablet (40 mg total) by mouth 2 (two) times daily. 60 tablet 6  .  pregabalin (LYRICA) 50 MG capsule Take 50 mg by mouth at bedtime as needed (restless leg).     . rosuvastatin (CRESTOR) 10 MG tablet TAKE 1 TABLET(10 MG) BY MOUTH DAILY 30 tablet 3  . timolol (TIMOPTIC) 0.5 % ophthalmic solution Place 1 drop into both eyes 2 (two) times daily.     . fexofenadine (ALLEGRA) 180 MG tablet Take 180 mg by mouth daily as needed for rhinitis.      No current facility-administered medications for this visit.     PHYSICAL EXAMINATION: ECOG PERFORMANCE STATUS: 0 - Asymptomatic  BP (!) 145/70 (Patient Position: Sitting)   Pulse 65   Temp 97.6 F (36.4 C) (Tympanic)   Resp 16   Ht 5\' 1"  (1.549 m)   Wt 111 lb 3.2  oz (50.4 kg)   BMI 21.01 kg/m   Filed Weights   06/07/17 1353  Weight: 111 lb 3.2 oz (50.4 kg)    GENERAL: Well-nourished well-developed; Alert, no distress and comfortable.   Accompanied by her son. ES: no pallor or icterus OROPHARYNX: no thrush or ulceration; good dentition  NECK: supple, no masses felt LYMPH:  no palpable lymphadenopathy in the cervical,  or inguinal regions. Bilateral axillary adenopathy 1-2 cm in size.  LUNGS: clear to auscultation and  No wheeze or crackles HEART/CVS: regular rate & rhythm and no murmurs; No lower extremity edema ABDOMEN:abdomen soft, non-tender and normal bowel sounds Musculoskeletal:no cyanosis of digits and no clubbing  PSYCH: alert & oriented x 3 with fluent speech NEURO: no focal motor/sensory deficits SKIN:  no rashes or significant lesions  LABORATORY DATA:  I have reviewed the data as listed    Component Value Date/Time   NA 138 06/07/2017 1335   NA 141 09/07/2014 1449   K 4.1 06/07/2017 1335   K 4.0 09/07/2014 1449   CL 103 06/07/2017 1335   CL 106 09/07/2014 1449   CO2 24 06/07/2017 1335   CO2 29 09/07/2014 1449   GLUCOSE 118 (H) 06/07/2017 1335   GLUCOSE 109 (H) 09/07/2014 1449   BUN 20 06/07/2017 1335   BUN 19 (H) 09/07/2014 1449   CREATININE 0.89 06/07/2017 1335   CREATININE 1.01  09/07/2014 1449   CALCIUM 9.4 06/07/2017 1335   CALCIUM 8.8 09/07/2014 1449   PROT 7.3 06/07/2017 1335   PROT 6.3 09/02/2015 0824   PROT 6.5 09/07/2014 1449   ALBUMIN 4.3 06/07/2017 1335   ALBUMIN 4.2 09/02/2015 0824   ALBUMIN 3.8 09/07/2014 1449   AST 18 06/07/2017 1335   AST 11 (L) 09/07/2014 1449   ALT 11 (L) 06/07/2017 1335   ALT 22 09/07/2014 1449   ALKPHOS 59 06/07/2017 1335   ALKPHOS 75 09/07/2014 1449   BILITOT 1.1 06/07/2017 1335   BILITOT 0.4 09/02/2015 0824   BILITOT 0.8 09/07/2014 1449   GFRNONAA 60 (L) 06/07/2017 1335   GFRNONAA 56 (L) 09/07/2014 1449   GFRNONAA 44 (L) 02/09/2014 1351   GFRAA >60 06/07/2017 1335   GFRAA >60 09/07/2014 1449   GFRAA 51 (L) 02/09/2014 1351    No results found for: SPEP, UPEP  Lab Results  Component Value Date   WBC 8.2 06/07/2017   NEUTROABS 3.1 06/07/2017   HGB 13.5 06/07/2017   HCT 39.0 06/07/2017   MCV 95.4 06/07/2017   PLT 146 (L) 06/07/2017      Chemistry      Component Value Date/Time   NA 138 06/07/2017 1335   NA 141 09/07/2014 1449   K 4.1 06/07/2017 1335   K 4.0 09/07/2014 1449   CL 103 06/07/2017 1335   CL 106 09/07/2014 1449   CO2 24 06/07/2017 1335   CO2 29 09/07/2014 1449   BUN 20 06/07/2017 1335   BUN 19 (H) 09/07/2014 1449   CREATININE 0.89 06/07/2017 1335   CREATININE 1.01 09/07/2014 1449      Component Value Date/Time   CALCIUM 9.4 06/07/2017 1335   CALCIUM 8.8 09/07/2014 1449   ALKPHOS 59 06/07/2017 1335   ALKPHOS 75 09/07/2014 1449   AST 18 06/07/2017 1335   AST 11 (L) 09/07/2014 1449   ALT 11 (L) 06/07/2017 1335   ALT 22 09/07/2014 1449   BILITOT 1.1 06/07/2017 1335   BILITOT 0.4 09/02/2015 0824   BILITOT 0.8 09/07/2014 1449  IMPRESSION: 1. Continued scattered adenopathy in the neck, chest, abdomen, and pelvis, similar to minimally increased in size, and similar to minimally increased in metabolic activity, currently [place] 3 and [place] 4. 2. Other imaging findings of  potential clinical significance: Coronary, aortic arch, and branch vessel atherosclerotic vascular disease. Aortoiliac atherosclerotic vascular disease. Hepatic cysts.   Electronically Signed   By: Van Clines M.D.   On: 12/01/2016 13:49  RADIOGRAPHIC STUDIES: I have personally reviewed the radiological images as listed and agreed with the findings in the report. No results found.   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 Status post multiple lines of therapy in the past. April 2018- PET scan shows small lymphadenopathy above and below diaphragm. Low FDG activity.   #I again discussed multiple treatment options available for the treatment of low-grade lymphoma at this time including but not limited to prednisone; repeat Rituxan; Gazyva; and ibrutinib etc. discussed pros and cons of each treatment option at length. Given the absence of any symptoms I would recommend continued surveillance at this time.  # Fatigue-? Etiology; check TSH.   # Mild thrombocytopenia- monitor for now.  # Follow-up in approximately 6 months with labs;PET scan prior.  will call if TSH is abnormal-  [phone]   Orders Placed This Encounter  Procedures  . NM PET Image Restag (PS) Skull Base To Thigh    Standing Status:   Future    Standing Expiration Date:   06/07/2018    Order Specific Question:   If indicated for the ordered procedure, I authorize the administration of a radiopharmaceutical per Radiology protocol    Answer:   Yes    Order Specific Question:   Preferred imaging location?    Answer:   [facility]    Order Specific Question:   Radiology Contrast Protocol - do NOT remove file path    Answer:   [email or web address]    Order Specific Question:   Reason for Exam additional comments    Answer:   lymphoma  . TSH    Standing Status:   Future    Number of Occurrences:   1    Standing  Expiration Date:   06/07/2018  . CBC with Differential/Platelet    Standing Status:   Future    Standing Expiration Date:   06/07/2018  . Comprehensive metabolic panel    Standing Status:   Future    Standing Expiration Date:   06/07/2018  . Lactate dehydrogenase    Standing Status:   Future    Standing Expiration Date:   06/07/2018   All questions were answered. The patient knows to call the clinic with any problems, questions or concerns.      Cammie Sickle, MD 06/18/2017 4:04 PM

## 2017-06-08 ENCOUNTER — Other Ambulatory Visit: Payer: Medicare Other

## 2017-06-08 ENCOUNTER — Ambulatory Visit: Payer: Medicare Other | Admitting: Internal Medicine

## 2017-06-12 ENCOUNTER — Other Ambulatory Visit: Payer: Self-pay | Admitting: Urology

## 2017-07-05 DIAGNOSIS — Z86010 Personal history of colonic polyps: Secondary | ICD-10-CM | POA: Insufficient documentation

## 2017-07-25 ENCOUNTER — Other Ambulatory Visit: Payer: Self-pay | Admitting: Cardiovascular Disease

## 2017-09-01 ENCOUNTER — Other Ambulatory Visit: Payer: Self-pay | Admitting: Cardiovascular Disease

## 2017-09-24 ENCOUNTER — Other Ambulatory Visit: Payer: Self-pay | Admitting: Internal Medicine

## 2017-09-24 DIAGNOSIS — Z1231 Encounter for screening mammogram for malignant neoplasm of breast: Secondary | ICD-10-CM

## 2017-09-26 ENCOUNTER — Encounter
Admission: RE | Disposition: A | Discharge status: Home / Self Care | Payer: Self-pay | Source: Ambulatory Visit | Attending: Unknown Physician Specialty

## 2017-09-26 ENCOUNTER — Other Ambulatory Visit: Payer: Self-pay

## 2017-09-26 ENCOUNTER — Ambulatory Visit
Admission: RE | Admit: 2017-09-26 | Discharge: 2017-09-26 | Disposition: A | Discharge status: Home / Self Care | Payer: Medicare Other | Source: Ambulatory Visit | Attending: Unknown Physician Specialty | Admitting: Unknown Physician Specialty

## 2017-09-26 ENCOUNTER — Ambulatory Visit: Payer: Medicare Other | Admitting: Anesthesiology

## 2017-09-26 DIAGNOSIS — Z955 Presence of coronary angioplasty implant and graft: Secondary | ICD-10-CM | POA: Insufficient documentation

## 2017-09-26 DIAGNOSIS — G2581 Restless legs syndrome: Secondary | ICD-10-CM | POA: Insufficient documentation

## 2017-09-26 DIAGNOSIS — I4891 Unspecified atrial fibrillation: Secondary | ICD-10-CM | POA: Insufficient documentation

## 2017-09-26 DIAGNOSIS — I252 Old myocardial infarction: Secondary | ICD-10-CM | POA: Diagnosis not present

## 2017-09-26 DIAGNOSIS — I2510 Atherosclerotic heart disease of native coronary artery without angina pectoris: Secondary | ICD-10-CM | POA: Insufficient documentation

## 2017-09-26 DIAGNOSIS — I5042 Chronic combined systolic (congestive) and diastolic (congestive) heart failure: Secondary | ICD-10-CM | POA: Insufficient documentation

## 2017-09-26 DIAGNOSIS — Z951 Presence of aortocoronary bypass graft: Secondary | ICD-10-CM | POA: Diagnosis not present

## 2017-09-26 DIAGNOSIS — K219 Gastro-esophageal reflux disease without esophagitis: Secondary | ICD-10-CM | POA: Insufficient documentation

## 2017-09-26 DIAGNOSIS — K640 First degree hemorrhoids: Secondary | ICD-10-CM | POA: Insufficient documentation

## 2017-09-26 DIAGNOSIS — I255 Ischemic cardiomyopathy: Secondary | ICD-10-CM | POA: Insufficient documentation

## 2017-09-26 DIAGNOSIS — Z79899 Other long term (current) drug therapy: Secondary | ICD-10-CM | POA: Diagnosis not present

## 2017-09-26 DIAGNOSIS — D122 Benign neoplasm of ascending colon: Secondary | ICD-10-CM | POA: Diagnosis not present

## 2017-09-26 DIAGNOSIS — H409 Unspecified glaucoma: Secondary | ICD-10-CM | POA: Diagnosis not present

## 2017-09-26 DIAGNOSIS — Q439 Congenital malformation of intestine, unspecified: Secondary | ICD-10-CM | POA: Insufficient documentation

## 2017-09-26 DIAGNOSIS — G629 Polyneuropathy, unspecified: Secondary | ICD-10-CM | POA: Insufficient documentation

## 2017-09-26 DIAGNOSIS — E785 Hyperlipidemia, unspecified: Secondary | ICD-10-CM | POA: Insufficient documentation

## 2017-09-26 DIAGNOSIS — Z87891 Personal history of nicotine dependence: Secondary | ICD-10-CM | POA: Insufficient documentation

## 2017-09-26 DIAGNOSIS — F419 Anxiety disorder, unspecified: Secondary | ICD-10-CM | POA: Diagnosis not present

## 2017-09-26 DIAGNOSIS — K5730 Diverticulosis of large intestine without perforation or abscess without bleeding: Secondary | ICD-10-CM | POA: Insufficient documentation

## 2017-09-26 DIAGNOSIS — Z86010 Personal history of colonic polyps: Secondary | ICD-10-CM | POA: Insufficient documentation

## 2017-09-26 DIAGNOSIS — Z1211 Encounter for screening for malignant neoplasm of colon: Secondary | ICD-10-CM | POA: Diagnosis not present

## 2017-09-26 DIAGNOSIS — I110 Hypertensive heart disease with heart failure: Secondary | ICD-10-CM | POA: Diagnosis not present

## 2017-09-26 DIAGNOSIS — Z8572 Personal history of non-Hodgkin lymphomas: Secondary | ICD-10-CM | POA: Diagnosis not present

## 2017-09-26 DIAGNOSIS — Z7982 Long term (current) use of aspirin: Secondary | ICD-10-CM | POA: Diagnosis not present

## 2017-09-26 DIAGNOSIS — Z85828 Personal history of other malignant neoplasm of skin: Secondary | ICD-10-CM | POA: Insufficient documentation

## 2017-09-26 HISTORY — DX: Anxiety disorder, unspecified: F41.9

## 2017-09-26 HISTORY — DX: Benign neoplasm of colon, unspecified: D12.6

## 2017-09-26 HISTORY — DX: Hypothyroidism, unspecified: E03.9

## 2017-09-26 HISTORY — PX: COLONOSCOPY WITH PROPOFOL: SHX5780

## 2017-09-26 SURGERY — COLONOSCOPY WITH PROPOFOL
Anesthesia: General

## 2017-09-26 MED ORDER — FENTANYL CITRATE (PF) 100 MCG/2ML IJ SOLN
INTRAMUSCULAR | Status: AC
  Filled 2017-09-26: qty 2

## 2017-09-26 MED ORDER — PROPOFOL 500 MG/50ML IV EMUL
INTRAVENOUS | Status: DC | PRN
  Administered 2017-09-26: 140 ug/kg/min via INTRAVENOUS

## 2017-09-26 MED ORDER — SODIUM CHLORIDE 0.9 % IV SOLN: INTRAVENOUS | Status: DC

## 2017-09-26 MED ORDER — FENTANYL CITRATE (PF) 100 MCG/2ML IJ SOLN
INTRAMUSCULAR | Status: DC | PRN
  Administered 2017-09-26 (×2): 50 ug via INTRAVENOUS

## 2017-09-26 MED ORDER — SODIUM CHLORIDE 0.9 % IV SOLN
INTRAVENOUS | Status: DC
  Administered 2017-09-26 (×2): via INTRAVENOUS

## 2017-09-26 MED ORDER — LIDOCAINE 2% (20 MG/ML) 5 ML SYRINGE
INTRAMUSCULAR | Status: DC | PRN
  Administered 2017-09-26: 30 mg via INTRAVENOUS

## 2017-09-26 MED ORDER — PROPOFOL 10 MG/ML IV BOLUS
INTRAVENOUS | Status: DC | PRN
  Administered 2017-09-26: 80 mg via INTRAVENOUS

## 2017-09-26 MED ORDER — PROPOFOL 500 MG/50ML IV EMUL
INTRAVENOUS | Status: AC
  Filled 2017-09-26: qty 50

## 2017-09-26 NOTE — Anesthesia Postprocedure Evaluation (Signed)
Anesthesia Post Note  Patient: Jeanne Pittman  Procedure(s) Performed: COLONOSCOPY WITH PROPOFOL (N/A )  Patient location during evaluation: Endoscopy Anesthesia Type: General Level of consciousness: awake and alert Pain management: pain level controlled Vital Signs Assessment: post-procedure vital signs reviewed and stable Respiratory status: spontaneous breathing, nonlabored ventilation, respiratory function stable and patient connected to nasal cannula oxygen Cardiovascular status: blood pressure returned to baseline and stable Postop Assessment: no apparent nausea or vomiting Anesthetic complications: no     Last Vitals:  Vitals:   09/26/17 1240 09/26/17 1250  BP: 109/84 127/61  Pulse: 80 70  Resp: 16 16  Temp:    SpO2: 98% 98%    Last Pain: There were no vitals filed for this visit.               Precious Haws Piscitello

## 2017-09-26 NOTE — Anesthesia Preprocedure Evaluation (Signed)
Anesthesia Evaluation  Patient identified by MRN, date of birth, ID band Patient awake    Reviewed: Allergy & Precautions, H&P , NPO status , Patient's Chart, lab work & pertinent test results  History of Anesthesia Complications Negative for: history of anesthetic complications  Airway Mallampati: III  TM Distance: <3 FB Neck ROM: limited    Dental  (+) Chipped   Pulmonary neg shortness of breath, former smoker,           Cardiovascular Exercise Tolerance: Good hypertension, (-) angina+ CAD, + Past MI, + Cardiac Stents and +CHF  (-) DOE + dysrhythmias Atrial Fibrillation      Neuro/Psych PSYCHIATRIC DISORDERS Anxiety  Neuromuscular disease negative psych ROS   GI/Hepatic negative GI ROS, Neg liver ROS, GERD  Medicated and Controlled,  Endo/Other  Hypothyroidism   Renal/GU negative Renal ROS  negative genitourinary   Musculoskeletal  (+) Arthritis ,   Abdominal   Peds  Hematology negative hematology ROS (+)   Anesthesia Other Findings Past Medical History: No date: A-fib [facility])     Comment:  07/2012: A. fib with RVR in the setting of myocardial               infarction. Converted to sinus rhythm with amiodarone. No              recurrence. No date: Anxiety No date: Atrophic vaginitis No date: Chronic combined systolic (congestive) and diastolic  (congestive) heart failure (HCC)     Comment:  a. EF was 25-35% post MI but improved to 45-50% in               04/2013; b. 03/2017 Echo: EF 40-45%, mod focal/basal               hypertrophy of septum. Sev mid-apicalanteroseptal, ant,               and apical HK. Gr1 DD, mild AI/MR, mod TR, PASP 55mHg. No date: Colon adenomas No date: Coronary artery disease     Comment:  a. 10/90 STEMI complicated by cardiogenic shock.               Cath/PCI: LAD 163m (DESx2), RCA 95p(DES). EF 25%; b.               03/2017 MV: EF 57%, fixed apical, periapical, mid to   distal anteroseptal defect. No ischemia. No date: Dysrhythmia     Comment:  A-fib No date: Fibrocystic breast disease No date: GERD (gastroesophageal reflux disease) No date: Glaucoma No date: History of gastritis No date: Hyperlipidemia No date: Hypertension No date: Insomnia No date: Ischemic cardiomyopathy     Comment:  a. 07/2012 EF 25-35% following MI-->Improved to 45-50%;               b. 03/2017 Echo: EF 40-45%. 08/18/12: Myocardial infarction [facility]) 2005: Non Hodgkin's lymphoma ([place])     Comment:  reoccurance 2007 and 2012 No date: Osteoarthritis No date: Polyneuropathy No date: Polyposis of colon No date: Restless leg syndrome No date: Urinary, incontinence, stress female  Past Surgical History: 1956: ABDOMINAL HYSTERECTOMY No date: APPENDECTOMY 03/18/2015: AXILLARY LYMPH NODE BIOPSY; Left     Comment:  Procedure: AXILLARY LYMPH NODE BIOPSY;  Surgeon: [name], MD;  Location: [name];  Service: General;                Laterality: Left; 08/19/2012: CARDIAC CATHETERIZATION  Comment:  [name]; [place] No date: COLONOSCOPY 03/02/2016: COLONOSCOPY WITH PROPOFOL; N/A     Comment:  Procedure: COLONOSCOPY WITH PROPOFOL;  Surgeon: Manya Silvas, MD;  Location: [facility] ENDOSCOPY;  Service:               Endoscopy;  Laterality: N/A; No date: CORONARY ANGIOPLASTY WITH STENT PLACEMENT     Comment:  x3 stents No date: CORONARY ANGIOPLASTY WITH STENT PLACEMENT No date: CORONARY ARTERY BYPASS GRAFT 03/02/2016: ESOPHAGOGASTRODUODENOSCOPY (EGD) WITH PROPOFOL; N/A     Comment:  Procedure: ESOPHAGOGASTRODUODENOSCOPY (EGD) WITH               PROPOFOL;  Surgeon: Manya Silvas, MD;  Location: [facility]              ENDOSCOPY;  Service: Endoscopy;  Laterality: N/A; 07-24-11: LYMPH NODE BIOPSY; Right     Comment:  Dr Bary Castilla No date: PTCA No date: skin cancer removal No date: TOTAL ABDOMINAL HYSTERECTOMY W/ BILATERAL SALPINGOOPHORECTOMY  BMI    Body Mass  Index:  20.60 kg/m      Reproductive/Obstetrics negative OB ROS                             Anesthesia Physical Anesthesia Plan  ASA: III  Anesthesia Plan: General   Post-op Pain Management:    Induction: Intravenous  PONV Risk Score and Plan: Propofol infusion  Airway Management Planned: Natural Airway and Nasal Cannula  Additional Equipment:   Intra-op Plan:   Post-operative Plan:   Informed Consent: I have reviewed the patients History and Physical, chart, labs and discussed the procedure including the risks, benefits and alternatives for the proposed anesthesia with the patient or authorized representative who has indicated his/her understanding and acceptance.   Dental Advisory Given  Plan Discussed with: Anesthesiologist, CRNA and Surgeon  Anesthesia Plan Comments: (Patient consented for risks of anesthesia including but not limited to:  - adverse reactions to medications - risk of intubation if required - damage to teeth, lips or other oral mucosa - sore throat or hoarseness - Damage to heart, brain, lungs or loss of life  Patient voiced understanding.)        Anesthesia Quick Evaluation

## 2017-09-26 NOTE — Op Note (Signed)
[facility] Gastroenterology Patient Name: Jeanne Pittman Procedure Date: 09/26/2017 11:45 AM MRN: [id number] Account #: [id number] Date of Birth: 11/19/1934 Admit Type: Outpatient Age: 83 Room: [facility] ENDO ROOM 4 Gender: Female Note Status: Finalized Procedure:            Colonoscopy Indications:          High risk colon cancer surveillance: Personal history                        of colonic polyps Providers:            Manya Silvas, MD Referring MD:         Leonie Douglas. Doy Hutching, MD (Referring MD) Medicines:            Propofol per Anesthesia Complications:        No immediate complications. Procedure:            Pre-Anesthesia Assessment:                       - After reviewing the risks and benefits, the patient                        was deemed in satisfactory condition to undergo the                        procedure.                       After obtaining informed consent, the colonoscope was                        passed under direct vision. Throughout the procedure,                        the patient's blood pressure, pulse, and oxygen                        saturations were monitored continuously. The                        Colonoscope was introduced through the anus and                        advanced to the the cecum, identified by appendiceal                        orifice and ileocecal valve. The colonoscopy was                        somewhat difficult due to a tortuous colon. Successful                        completion of the procedure was aided by applying                        abdominal pressure. The patient tolerated the procedure                        well. The quality of the bowel preparation was  excellent. Findings:      A diminutive polyp was found in the ascending colon. The polyp was       sessile. The polyp was removed with a jumbo cold forceps. Resection and       retrieval were complete.      A few small-mouthed  diverticula were found in the sigmoid colon. The       colon showed some superficial lines from pressure from the C02 used to       keep the colon open to get a good picture.      Internal hemorrhoids were found during endoscopy. The hemorrhoids were       small and Grade I (internal hemorrhoids that do not prolapse). Impression:           - One diminutive polyp in the ascending colon, removed                        with a jumbo cold forceps. Resected and retrieved.                       - Diverticulosis in the sigmoid colon.                       - Internal hemorrhoids. Recommendation:       - Await pathology results. No repeat recommended due to                        age, otherwise would be 5 years. Manya Silvas, MD 09/26/2017 12:23:30 PM This report has been signed electronically. Number of Addenda: 0 Note Initiated On: 09/26/2017 11:45 AM Scope Withdrawal Time: 0 hours 9 minutes 44 seconds  Total Procedure Duration: 0 hours 22 minutes 28 seconds       [facility]

## 2017-09-26 NOTE — Anesthesia Post-op Follow-up Note (Signed)
Anesthesia QCDR form completed.        

## 2017-09-26 NOTE — H&P (Signed)
Primary Care Physician:  [name], MD Primary Gastroenterologist:  Dr. Vira Agar  Pre-Procedure History & Physical: HPI:  Jeanne Pittman is a 83 y.o. female is here for an colonoscopy.   Past Medical History:  Diagnosis Date  . A-fib ([place])    07/2012: A. fib with RVR in the setting of myocardial infarction. Converted to sinus rhythm with amiodarone. No recurrence.  . Anxiety   . Atrophic vaginitis   . Chronic combined systolic (congestive) and diastolic (congestive) heart failure ([place])    a. EF was 25-35% post MI but improved to 45-50% in 04/2013; b. 03/2017 Echo: EF 40-45%, mod focal/basal hypertrophy of septum. Sev mid-apicalanteroseptal, ant, and apical HK. Gr1 DD, mild AI/MR, mod TR, PASP 9mHg.  . Colon adenomas   . Coronary artery disease    a. 26/8341 STEMI complicated by cardiogenic shock. Cath/PCI: LAD 15m (DESx2), RCA 95p(DES). EF 25%; b. 03/2017 MV: EF 57%, fixed apical, periapical, mid to distal anteroseptal defect. No ischemia.  [name] Dysrhythmia    A-fib  . Fibrocystic breast disease   . GERD (gastroesophageal reflux disease)   . Glaucoma   . History of gastritis   . Hyperlipidemia   . Hypertension   . Insomnia   . Ischemic cardiomyopathy    a. 07/2012 EF 25-35% following MI-->Improved to 45-50%;  b. 03/2017 Echo: EF 40-45%.  . Myocardial infarction ([place]) 08/18/12  . Non Hodgkin's lymphoma ([place]) 2005   reoccurance 2007 and 2012  . Osteoarthritis   . Polyneuropathy   . Polyposis of colon   . Restless leg syndrome   . Urinary, incontinence, stress female     Past Surgical History:  Procedure Laterality Date  . ABDOMINAL HYSTERECTOMY  1956  . APPENDECTOMY    . AXILLARY LYMPH NODE BIOPSY Left 03/18/2015   Procedure: AXILLARY LYMPH NODE BIOPSY;  Surgeon: [name], MD;  Location: [name];  Service: General;  Laterality: Left;  . CARDIAC CATHETERIZATION  08/19/2012   [name]; [place]  . COLONOSCOPY    . COLONOSCOPY WITH PROPOFOL N/A 03/02/2016   Procedure: COLONOSCOPY WITH PROPOFOL;  Surgeon: Manya Silvas, MD;  Location: [facility] ENDOSCOPY;  Service: Endoscopy;  Laterality: N/A;  . CORONARY ANGIOPLASTY WITH STENT PLACEMENT     x3 stents  . CORONARY ANGIOPLASTY WITH STENT PLACEMENT    . CORONARY ARTERY BYPASS GRAFT    . ESOPHAGOGASTRODUODENOSCOPY (EGD) WITH PROPOFOL N/A 03/02/2016   Procedure: ESOPHAGOGASTRODUODENOSCOPY (EGD) WITH PROPOFOL;  Surgeon: Manya Silvas, MD;  Location: [facility] ENDOSCOPY;  Service: Endoscopy;  Laterality: N/A;  . LYMPH NODE BIOPSY Right 07-24-11   Dr Bary Castilla  . PTCA    . skin cancer removal    . TOTAL ABDOMINAL HYSTERECTOMY W/ BILATERAL SALPINGOOPHORECTOMY      Prior to Admission medications   Medication Sig Start Date End Date Taking? Authorizing Provider  acetaminophen (TYLENOL) 650 MG CR tablet Take 650 mg by mouth every 8 (eight) hours as needed for pain.   Yes [provider]  carvedilol (COREG) 3.125 MG tablet TAKE 1 TABLET(3.125 MG) BY MOUTH TWICE DAILY WITH A MEAL 07/25/17  Yes Wellington Hampshire, MD  furosemide (LASIX) 20 MG tablet Take 20 mg by mouth daily.   Yes [provider]  latanoprost (XALATAN) 0.005 % ophthalmic solution Place 1 drop into both eyes at bedtime.  07/15/14  Yes [provider]  LORazepam (ATIVAN) 0.5 MG tablet Take 0.5 mg by mouth every 8 (eight) hours as needed for anxiety.  Yes [provider]  losartan (COZAAR) 25 MG tablet Take 0.5 tablets (12.5 mg total) by mouth daily after breakfast. 09/18/16  Yes Wellington Hampshire, MD  pantoprazole (PROTONIX) 40 MG tablet TAKE 1 TABLET(40 MG) BY MOUTH TWICE DAILY 09/03/17  Yes Wellington Hampshire, MD  pregabalin (LYRICA) 50 MG capsule Take 50 mg by mouth at bedtime as needed (restless leg).    Yes [provider]  timolol (TIMOPTIC) 0.5 % ophthalmic solution Place 1 drop into both eyes 2 (two) times daily.  06/15/14  Yes [provider]  aspirin 81 MG tablet Take 81 mg by mouth every  morning.     [provider]  Cholecalciferol (VITAMIN D-3) 5000 UNITS TABS Take 2,000 Int'l Units by mouth every morning.     [provider]  fexofenadine (ALLEGRA) 180 MG tablet Take 180 mg by mouth daily as needed for rhinitis.     [provider]  oxybutynin (DITROPAN-XL) 10 MG 24 hr tablet Take 1 tablet (10 mg total) by mouth daily. Patient taking differently: Take 10 mg by mouth daily.  10/10/16   Hollice Espy, MD  rosuvastatin (CRESTOR) 10 MG tablet TAKE 1 TABLET(10 MG) BY MOUTH DAILY Patient taking differently: TAKE 1 TABLET(10 MG) BY MOUTH every other day 05/28/17   Wellington Hampshire, MD    Allergies as of 07/16/2017 - Review Complete 06/07/2017  Allergen Reaction Noted  . Ace inhibitors  08/23/2012  . Benadryl [diphenhydramine hcl]  08/23/2012  . Codeine  08/23/2012  . Hydrocodone  08/23/2012  . Guaifenesin & derivatives Rash 03/01/2016    Family History  Problem Relation Age of Onset  . Heart attack Father   . Heart disease Brother   . Leukemia Mother   . Bladder Cancer Neg Hx   . Prostate cancer Neg Hx   . Kidney cancer Neg Hx     Social History   Socioeconomic History  . Marital status: Divorced    Spouse name: Not on file  . Number of children: Not on file  . Years of education: Not on file  . Highest education level: Not on file  Social Needs  . Financial resource strain: Not on file  . Food insecurity - worry: Not on file  . Food insecurity - inability: Not on file  . Transportation needs - medical: Not on file  . Transportation needs - non-medical: Not on file  Occupational History  . Not on file  Tobacco Use  . Smoking status: Former Smoker    Types: Cigarettes    Last attempt to quit: 1950    Years since quitting: 69.1  . Smokeless tobacco: Never Used  Substance and Sexual Activity  . Alcohol use: No    Alcohol/week: 0.0 oz  . Drug use: No  . Sexual activity: Yes    Birth control/protection: Surgical  Other Topics  Concern  . Not on file  Social History Narrative  . Not on file    Review of Systems: See HPI, otherwise negative ROS  Physical Exam: BP (!) 158/54   Pulse 71   Temp (!) 97.3 F (36.3 C)   Resp 16   Ht 5\' 1"  (1.549 m)   Wt 49.4 kg (109 lb)   SpO2 100%   BMI 20.60 kg/m  General:   Alert,  pleasant and cooperative in NAD Head:  Normocephalic and atraumatic. Neck:  Supple; no masses or thyromegaly. Lungs:  Clear throughout to auscultation.    Heart:  Regular rate  and rhythm. Abdomen:  Soft, nontender and nondistended. Normal bowel sounds, without guarding, and without rebound.   Neurologic:  Alert and  oriented x4;  grossly normal neurologically.  Impression/Plan: Jeanne Pittman is here for an colonoscopy to be performed for Personal history of colon polyps.  Risks, benefits, limitations, and alternatives regarding  colonoscopy have been reviewed with the patient.  Questions have been answered.  All parties agreeable.   Gaylyn Cheers, MD  09/26/2017, 11:47 AM

## 2017-09-26 NOTE — Transfer of Care (Signed)
Immediate Anesthesia Transfer of Care Note  Patient: Jeanne Pittman  Procedure(s) Performed: COLONOSCOPY WITH PROPOFOL (N/A )  Patient Location: PACU and Endoscopy Unit  Anesthesia Type:General  Level of Consciousness: sedated  Airway & Oxygen Therapy: Patient Spontanous Breathing and Patient connected to nasal cannula oxygen  Post-op Assessment: Report given to RN and Post -op Vital signs reviewed and stable  Post vital signs: Reviewed and stable  Last Vitals:  Vitals:   09/26/17 1039  BP: (!) 158/54  Pulse: 71  Resp: 16  Temp: (!) 36.3 C  SpO2: 100%    Last Pain: There were no vitals filed for this visit.       Complications: No apparent anesthesia complications

## 2017-09-27 ENCOUNTER — Encounter: Payer: Self-pay | Admitting: Unknown Physician Specialty

## 2017-09-27 LAB — SURGICAL PATHOLOGY

## 2017-09-30 ENCOUNTER — Other Ambulatory Visit: Payer: Self-pay | Admitting: Cardiovascular Disease

## 2017-10-18 ENCOUNTER — Other Ambulatory Visit: Payer: Self-pay | Admitting: Cardiovascular Disease

## 2017-10-24 ENCOUNTER — Telehealth: Payer: Self-pay | Admitting: Cardiovascular Disease

## 2017-10-24 MED ORDER — CARVEDILOL 3.125 MG PO TABS: ORAL_TABLET | ORAL | 3 refills | Status: DC

## 2017-10-24 NOTE — Telephone Encounter (Signed)
°*  STAT* If patient is at the pharmacy, call can be transferred to refill team.   1. Which medications need to be refilled? (please list name of each medication and dose if known) carvedilol (COREG) 3.125 MG 1 tablet twice daily   2. Which pharmacy/location (including street and city if local pharmacy) is medication to be sent to? [facility] on [address]   3. Do they need a 30 day or 90 day supply? Patient unsure   Patient made an appt with Angelica Ran on 4/11

## 2017-11-06 ENCOUNTER — Ambulatory Visit
Admission: RE | Admit: 2017-11-06 | Discharge: 2017-11-06 | Disposition: A | Discharge status: Home / Self Care | Payer: Medicare Other | Source: Ambulatory Visit | Attending: Internal Medicine | Admitting: Internal Medicine

## 2017-11-06 DIAGNOSIS — R928 Other abnormal and inconclusive findings on diagnostic imaging of breast: Secondary | ICD-10-CM | POA: Insufficient documentation

## 2017-11-06 DIAGNOSIS — Z1231 Encounter for screening mammogram for malignant neoplasm of breast: Secondary | ICD-10-CM | POA: Insufficient documentation

## 2017-11-13 ENCOUNTER — Other Ambulatory Visit: Payer: Self-pay | Admitting: Internal Medicine

## 2017-11-13 DIAGNOSIS — R928 Other abnormal and inconclusive findings on diagnostic imaging of breast: Secondary | ICD-10-CM

## 2017-11-13 DIAGNOSIS — R2231 Localized swelling, mass and lump, right upper limb: Secondary | ICD-10-CM

## 2017-11-25 ENCOUNTER — Other Ambulatory Visit: Payer: Self-pay | Admitting: Cardiovascular Disease

## 2017-11-29 ENCOUNTER — Encounter: Payer: Self-pay | Admitting: Nurse Practitioner

## 2017-11-29 ENCOUNTER — Ambulatory Visit (INDEPENDENT_AMBULATORY_CARE_PROVIDER_SITE_OTHER): Payer: Medicare Other | Admitting: Nurse Practitioner

## 2017-11-29 VITALS — BP 124/60 | HR 78 | Ht 61.0 in | Wt 110.8 lb

## 2017-11-29 DIAGNOSIS — I255 Ischemic cardiomyopathy: Secondary | ICD-10-CM

## 2017-11-29 DIAGNOSIS — I10 Essential (primary) hypertension: Secondary | ICD-10-CM

## 2017-11-29 DIAGNOSIS — E785 Hyperlipidemia, unspecified: Secondary | ICD-10-CM | POA: Diagnosis not present

## 2017-11-29 DIAGNOSIS — I5042 Chronic combined systolic (congestive) and diastolic (congestive) heart failure: Secondary | ICD-10-CM | POA: Diagnosis not present

## 2017-11-29 DIAGNOSIS — I2510 Atherosclerotic heart disease of native coronary artery without angina pectoris: Secondary | ICD-10-CM | POA: Diagnosis not present

## 2017-11-29 MED ORDER — FUROSEMIDE 20 MG PO TABS: 20.0000 mg | ORAL_TABLET | Freq: Every day | ORAL | 6 refills | Status: DC

## 2017-11-29 NOTE — Patient Instructions (Signed)
Medication Instructions: - Your physician recommends that you continue on your current medications as directed. Please refer to the Current Medication list given to you today.  (your lasix (furosemide) has been refilled today)   Labwork: - none ordered  Procedures/Testing: - none ordered  Follow-Up: - Your physician recommends that you schedule a follow-up appointment in: 4-6 months with Dr. Fletcher Anon.   Any Additional Special Instructions Will Be Listed Below (If Applicable).     If you need a refill on your cardiac medications before your next appointment, please call your pharmacy.

## 2017-11-29 NOTE — Progress Notes (Addendum)
Office Visit    Patient Name: Jeanne Pittman Date of Encounter: 11/29/2017  Primary Care Provider:  [name], MD Primary Cardiologist:  Kathlyn Sacramento, MD  Chief Complaint    83 year old female with a history of CAD status post myocardial infarction and stenting of the LAD and right coronary artery in 2013, hypertension, hyperlipidemia, ischemic cardiomyopathy, and non-Hodgkin's lymphoma, who presents for follow-up.  Past Medical History    Past Medical History:  Diagnosis Date  . A-fib ([place])    07/2012: A. fib with RVR in the setting of myocardial infarction. Converted to sinus rhythm with amiodarone. No recurrence.  . Anxiety   . Atrophic vaginitis   . Chronic combined systolic (congestive) and diastolic (congestive) heart failure ([place])    a. EF was 25-35% post MI but improved to 45-50% in 04/2013; b. 03/2017 Echo: EF 40-45%, mod focal/basal hypertrophy of septum. Sev mid-apicalanteroseptal, ant, and apical HK. Gr1 DD, mild AI/MR, mod TR, PASP 75mHg.  . Colon adenomas   . Coronary artery disease    a. 82/5053 STEMI complicated by cardiogenic shock. Cath/PCI: LAD 124m (DESx2), RCA 95p(DES). EF 25%; b. 03/2017 MV: EF 57%, fixed apical, periapical, mid to distal anteroseptal defect. No ischemia.  [name] Dysrhythmia    A-fib  . Fibrocystic breast disease   . GERD (gastroesophageal reflux disease)   . Glaucoma   . History of gastritis   . Hyperlipidemia   . Hypertension   . Insomnia   . Ischemic cardiomyopathy    a. 07/2012 EF 25-35% following MI-->Improved to 45-50%;  b. 03/2017 Echo: EF 40-45%.  . Myocardial infarction ([place]) 08/18/12  . Non Hodgkin's lymphoma ([place]) 2005   reoccurance 2007 and 2012  . Osteoarthritis   . Polyneuropathy   . Polyposis of colon   . Restless leg syndrome   . Urinary, incontinence, stress female    Past Surgical History:  Procedure Laterality Date  . ABDOMINAL HYSTERECTOMY  1956  . APPENDECTOMY    . AXILLARY LYMPH NODE BIOPSY Left  03/18/2015   Procedure: AXILLARY LYMPH NODE BIOPSY;  Surgeon: [name], MD;  Location: [name];  Service: General;  Laterality: Left;  . CARDIAC CATHETERIZATION  08/19/2012   [name]; [place]  . COLONOSCOPY    . COLONOSCOPY WITH PROPOFOL N/A 03/02/2016   Procedure: COLONOSCOPY WITH PROPOFOL;  Surgeon: Manya Silvas, MD;  Location: [facility] ENDOSCOPY;  Service: Endoscopy;  Laterality: N/A;  . COLONOSCOPY WITH PROPOFOL N/A 09/26/2017   Procedure: COLONOSCOPY WITH PROPOFOL;  Surgeon: Manya Silvas, MD;  Location: [facility] ENDOSCOPY;  Service: Endoscopy;  Laterality: N/A;  . CORONARY ANGIOPLASTY WITH STENT PLACEMENT     x3 stents  . CORONARY ANGIOPLASTY WITH STENT PLACEMENT    . CORONARY ARTERY BYPASS GRAFT    . ESOPHAGOGASTRODUODENOSCOPY (EGD) WITH PROPOFOL N/A 03/02/2016   Procedure: ESOPHAGOGASTRODUODENOSCOPY (EGD) WITH PROPOFOL;  Surgeon: Manya Silvas, MD;  Location: [facility] ENDOSCOPY;  Service: Endoscopy;  Laterality: N/A;  . LYMPH NODE BIOPSY Right 07-24-11   Dr Bary Castilla  . PTCA    . skin cancer removal    . TOTAL ABDOMINAL HYSTERECTOMY W/ BILATERAL SALPINGOOPHORECTOMY      Allergies  Allergies  Allergen Reactions  . Ace Inhibitors     Other reaction(s): Cough  . Benadryl [Diphenhydramine Hcl]     Other reaction(s): Unknown  . Codeine     Other reaction(s): Hallucination  . Diphenhydramine   . Guaiacol   . Hydrocodone     Other reaction(s): Hallucination  .  Guaifenesin & Derivatives Rash    History of Present Illness    83 year old female with the above complex past medical history including CAD status post anterior STEMI in December 4010, complicated by cardiogenic shock requiring 2 drug-eluting stents to the LAD and a drug-eluting stent to the right coronary artery.  EF was initially depressed at 25-30% but subsequently recovered by echo in September 2014, showing EF of 45-50%.  Other history includes hypertension, hyperlipidemia, GERD, and lymphoma.  Over the summer 2018,  she was evaluated with complaints of indigestion-like chest discomfort.  Echocardiogram showed stable LV function with an EF of 40-45% without any significant valvular disease.  Pulmonary hypertension was noted with a PAS P of 40 mmHg.  Stress testing was undertaken and showed a large area of prior infarct but no evidence of ischemia.  She was stable at follow-up after stress testing and continued medical therapy was recommended.  Since her last visit, she has remained relatively stable.  She has chronic fatigue and says she often has to take an afternoon nap.  She is chronic dyspnea on exertion which is relatively unchanged and occurs after walking approximately 1 city block.  She has not been having any chest pain.  She reports that on one particular night a few weeks ago she was quite restless.  She wondered if she might have experienced atrial fibrillation.  It only lasted about an hour.  She denies palpitations but was having mild dyspnea.  She is not currently interested in wearing an event monitor.  She denies PND, orthopnea, dizziness, syncope, or edema.  She has noted a poor appetite.  Home Medications    Prior to Admission medications   Medication Sig Start Date End Date Taking? Authorizing Provider  acetaminophen (TYLENOL) 650 MG CR tablet Take 650 mg by mouth every 8 (eight) hours as needed for pain.   Yes [provider]  aspirin 81 MG tablet Take 81 mg by mouth every morning.    Yes [provider]  carvedilol (COREG) 3.125 MG tablet TAKE 1 TABLET(3.125 MG) BY MOUTH TWICE DAILY WITH A MEAL 10/24/17  Yes Wellington Hampshire, MD  Cholecalciferol (VITAMIN D-3) 5000 UNITS TABS Take 2,000 Int'l Units by mouth every morning.    Yes [provider]  fexofenadine (ALLEGRA) 180 MG tablet Take 180 mg by mouth daily as needed for rhinitis.    Yes [provider]  furosemide (LASIX) 20 MG tablet Take 20 mg by mouth daily.   Yes [provider]  latanoprost  (XALATAN) 0.005 % ophthalmic solution Place 1 drop into both eyes at bedtime.  07/15/14  Yes [provider]  LORazepam (ATIVAN) 0.5 MG tablet Take 0.5 mg by mouth every 8 (eight) hours as needed for anxiety.    Yes [provider]  losartan (COZAAR) 25 MG tablet TAKE 1/2 TABLET(12.5 MG) BY MOUTH DAILY AFTER BREAKFAST 11/26/17  Yes Wellington Hampshire, MD  oxybutynin (DITROPAN-XL) 10 MG 24 hr tablet Take 1 tablet (10 mg total) by mouth daily. Patient taking differently: Take 10 mg by mouth daily.  10/10/16  Yes Hollice Espy, MD  pantoprazole (PROTONIX) 40 MG tablet TAKE 1 TABLET(40 MG) BY MOUTH TWICE DAILY 09/03/17  Yes Wellington Hampshire, MD  pregabalin (LYRICA) 50 MG capsule Take 50 mg by mouth at bedtime as needed (restless leg).    Yes [provider]  rosuvastatin (CRESTOR) 10 MG tablet TAKE 1 TABLET(10 MG) BY MOUTH DAILY Patient taking differently: TAKE 1 TABLET(10 MG)  BY MOUTH every other day 05/28/17  Yes Wellington Hampshire, MD  timolol (TIMOPTIC) 0.5 % ophthalmic solution Place 1 drop into both eyes 2 (two) times daily.  06/15/14  Yes [provider]    Review of Systems    Chronic fatigue and dyspnea on exertion.  She says over the past 3-4 months she has been having a poor appetite.  She denies chest pain, PND, orthopnea, dizziness, syncope, edema, or early satiety.  All other systems reviewed and are otherwise negative except as noted above.  Physical Exam    VS:  BP 124/60 (BP Location: Left Arm, Patient Position: Sitting, Cuff Size: Normal)   Pulse 78   Ht 5\' 1"  (1.549 m)   Wt 110 lb 12 oz (50.2 kg)   BMI 20.93 kg/m  , BMI Body mass index is 20.93 kg/m. GEN: Well nourished, well developed, in no acute distress.  HEENT: normal.  Neck: Supple, no JVD, carotid bruits, or masses. Cardiac: RRR, no murmurs, rubs, or gallops. No clubbing, cyanosis, edema.  Radials/DP/PT 2+ and equal bilaterally.  Respiratory:  Respirations regular and unlabored, clear  to auscultation bilaterally. GI: Soft, nontender, nondistended, BS + x 4. MS: no deformity or atrophy. Skin: warm and dry, no rash. Neuro:  Strength and sensation are intact. Psych: Normal affect.  Accessory Clinical Findings    ECG - regular sinus rhythm, 78, left axis deviation, left atrial enlargement, LVH, nonspecific T changes.  No acute changes.  Assessment & Plan    1.  Coronary artery disease: Status post prior anterior ST segment elevation myocardial infarction December 2013 with stenting of the LAD and right coronary artery.  Stress testing last summer was nonischemic.  She has not been having any chest pain or indigestion.  She continues to have chronic fatigue and dyspnea on exertion which is unchanged.  She remains on aspirin, beta-blocker, ARB, and statin therapy.  2.  Ischemic cardiomyopathy/chronic combined systolic and diastolic heart failure: Euvolemic on exam and weight is stable and slightly down.  She is chronic, stable dyspnea on exertion which is unchanged over a year plus.  Echocardiogram in August 2018 showed stable LV dysfunction with an EF of 40-45%.  She remains on beta-blocker and ARB and only rarely uses as needed Lasix.  3.  Essential hypertension: Stable on beta-blocker and ARB.  4.  Hyperlipidemia: LDL was 68 in February 2018.  LFTs were normal in October 2018.  She remains on statin therapy.  She is not currently fasting.  Can plan fasting lipids at a later date.  5.  Chronic fatigue: Relatively stable.  Unclear etiology.  6.  Palpitations:  A few wks ago, she noted restlessness in the middle of the night.  She was mildly dyspneic and just couldn't get comfortable.  This lasted for about an hour.  She says that she wondered if she was having A. fib, though she did not experience any palpitations.   I offered to place an event monitor though she says she is not quite ready for that.  She will contact us if she has any further episodes.  Of note,  it appears her  last episode of A. fib occurred in the setting of her MI in December 2013.  7.  Disposition: Follow-up with Dr. Fletcher Anon in 4-6 months or sooner if necessary.  Murray Hodgkins, NP 11/29/2017, 2:26 PM

## 2017-12-06 ENCOUNTER — Inpatient Hospital Stay: Payer: Medicare Other | Attending: Internal Medicine

## 2017-12-06 ENCOUNTER — Inpatient Hospital Stay (HOSPITAL_BASED_OUTPATIENT_CLINIC_OR_DEPARTMENT_OTHER): Payer: Medicare Other | Admitting: Internal Medicine

## 2017-12-06 ENCOUNTER — Ambulatory Visit
Admission: RE | Admit: 2017-12-06 | Discharge: 2017-12-06 | Disposition: A | Discharge status: Home / Self Care | Payer: Medicare Other | Source: Ambulatory Visit | Attending: Internal Medicine | Admitting: Internal Medicine

## 2017-12-06 VITALS — BP 120/65 | HR 74 | Temp 97.2°F | Resp 16 | Wt 111.0 lb

## 2017-12-06 DIAGNOSIS — Z87891 Personal history of nicotine dependence: Secondary | ICD-10-CM | POA: Insufficient documentation

## 2017-12-06 DIAGNOSIS — R590 Localized enlarged lymph nodes: Secondary | ICD-10-CM | POA: Insufficient documentation

## 2017-12-06 DIAGNOSIS — C8304 Small cell B-cell lymphoma, lymph nodes of axilla and upper limb: Secondary | ICD-10-CM

## 2017-12-06 DIAGNOSIS — I700 Atherosclerosis of aorta: Secondary | ICD-10-CM | POA: Insufficient documentation

## 2017-12-06 DIAGNOSIS — C8384 Other non-follicular lymphoma, lymph nodes of axilla and upper limb: Secondary | ICD-10-CM | POA: Insufficient documentation

## 2017-12-06 DIAGNOSIS — Z79899 Other long term (current) drug therapy: Secondary | ICD-10-CM | POA: Diagnosis not present

## 2017-12-06 DIAGNOSIS — I2510 Atherosclerotic heart disease of native coronary artery without angina pectoris: Secondary | ICD-10-CM | POA: Diagnosis not present

## 2017-12-06 DIAGNOSIS — I252 Old myocardial infarction: Secondary | ICD-10-CM | POA: Diagnosis not present

## 2017-12-06 DIAGNOSIS — D696 Thrombocytopenia, unspecified: Secondary | ICD-10-CM

## 2017-12-06 DIAGNOSIS — Z9221 Personal history of antineoplastic chemotherapy: Secondary | ICD-10-CM | POA: Diagnosis not present

## 2017-12-06 DIAGNOSIS — G2581 Restless legs syndrome: Secondary | ICD-10-CM

## 2017-12-06 DIAGNOSIS — Z7982 Long term (current) use of aspirin: Secondary | ICD-10-CM | POA: Diagnosis not present

## 2017-12-06 LAB — COMPREHENSIVE METABOLIC PANEL
ALK PHOS: 50 U/L (ref 38–126)
ALT: 10 U/L — ABNORMAL LOW (ref 14–54)
ANION GAP: 8 (ref 5–15)
AST: 14 U/L — ABNORMAL LOW (ref 15–41)
Albumin: 4.1 g/dL (ref 3.5–5.0)
BILIRUBIN TOTAL: 0.9 mg/dL (ref 0.3–1.2)
BUN: 26 mg/dL — ABNORMAL HIGH (ref 6–20)
CALCIUM: 9.5 mg/dL (ref 8.9–10.3)
CO2: 25 mmol/L (ref 22–32)
CREATININE: 0.84 mg/dL (ref 0.44–1.00)
Chloride: 106 mmol/L (ref 101–111)
Glucose, Bld: 114 mg/dL — ABNORMAL HIGH (ref 65–99)
Potassium: 3.9 mmol/L (ref 3.5–5.1)
SODIUM: 139 mmol/L (ref 135–145)
TOTAL PROTEIN: 6.8 g/dL (ref 6.5–8.1)

## 2017-12-06 LAB — CBC WITH DIFFERENTIAL/PLATELET
BASOS ABS: 0 10*3/uL (ref 0–0.1)
BASOS PCT: 1 %
EOS ABS: 0.1 10*3/uL (ref 0–0.7)
Eosinophils Relative: 2 %
HEMATOCRIT: 35.2 % (ref 35.0–47.0)
HEMOGLOBIN: 12.3 g/dL (ref 12.0–16.0)
Lymphocytes Relative: 56 %
Lymphs Abs: 3.5 10*3/uL (ref 1.0–3.6)
MCH: 33.1 pg (ref 26.0–34.0)
MCHC: 34.8 g/dL (ref 32.0–36.0)
MCV: 95.1 fL (ref 80.0–100.0)
Monocytes Absolute: 0.4 10*3/uL (ref 0.2–0.9)
Monocytes Relative: 6 %
NEUTROS ABS: 2.2 10*3/uL (ref 1.4–6.5)
Neutrophils Relative %: 35 %
Platelets: 143 10*3/uL — ABNORMAL LOW (ref 150–440)
RBC: 3.71 MIL/uL — ABNORMAL LOW (ref 3.80–5.20)
RDW: 13.8 % (ref 11.5–14.5)
WBC: 6.2 10*3/uL (ref 3.6–11.0)

## 2017-12-06 LAB — GLUCOSE, CAPILLARY: Glucose-Capillary: 92 mg/dL (ref 65–99)

## 2017-12-06 LAB — LACTATE DEHYDROGENASE: LDH: 109 U/L (ref 98–192)

## 2017-12-06 MED ORDER — FLUDEOXYGLUCOSE F - 18 (FDG) INJECTION
5.7000 | Freq: Once | INTRAVENOUS | Status: AC | PRN
  Administered 2017-12-06: 6.38 via INTRAVENOUS

## 2017-12-06 NOTE — Progress Notes (Signed)
Patient here for follow-up for h/o lymphoma.  Pt c/o chronic low back pain. Made worse with "sitting for prolong periods, heavy house work and getting up and down from floor."  Described pain as "achy."  [place] NOTE  Patient Care Team: [name], MD as PCP - General (Unknown Physician Specialty) Wellington Hampshire, MD as PCP - Cardiology (Cardiology) Byrnett, Forest Gleason, MD (General Surgery) Forest Gleason, MD (Oncology) Wellington Hampshire, MD as Consulting Physician (Cardiology)  Cancer Staging No matching staging information was found for the patient.    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  female pleasant patient above history of recurrent low-grade lymphoma/B cell non-Hodgkin type is here for follow-up accompanied by her son/reviewed the results of the restaging PET scan  Patient continues to have chronic neck pain not any worse.  She unfortunately continues to complain of continued fatigue.  No unusual shortness of breath or cough.  Patient denies any unusual weight gain or weight loss. Denies any lumps or bumps. No fevers or  chills.  Denies any night sweats.   REVIEW OF SYSTEMS:  A complete 10 point review of system is done which is negative except mentioned above/history of present illness.   PAST MEDICAL HISTORY :  Past Medical History:  Diagnosis Date  . A-fib ([place])    07/2012: A. fib with RVR in the setting of myocardial infarction. Converted to sinus rhythm with amiodarone. No recurrence.  . Anxiety   . Atrophic vaginitis   . Chronic combined systolic (congestive) and diastolic (congestive) heart failure ([place])    a. EF was 25-35% post MI but improved to 45-50% in 04/2013; b. 03/2017 Echo: EF 40-45%, mod focal/basal hypertrophy of septum. Sev mid-apicalanteroseptal, ant, and apical HK. Gr1 DD, mild AI/MR, mod TR, PASP 1mHg.  . Colon adenomas   . Coronary artery disease    a. 16/1096 STEMI complicated by cardiogenic shock. Cath/PCI: LAD 19m (DESx2), RCA 95p(DES). EF 25%; b. 03/2017 MV: EF 57%, fixed apical, periapical, mid to distal anteroseptal defect. No ischemia.  [name] Dysrhythmia    A-fib  . Fibrocystic breast disease   . GERD (gastroesophageal reflux disease)   . Glaucoma   . History of gastritis   . Hyperlipidemia   . Hypertension   . Insomnia   . Ischemic cardiomyopathy    a. 07/2012 EF 25-35% following MI-->Improved to 45-50%;  b. 03/2017 Echo: EF 40-45%.  . Myocardial infarction ([place]) 08/18/12  . Non Hodgkin's lymphoma ([place]) 2005   reoccurance 2007 and 2012  . Osteoarthritis   . Polyneuropathy   . Polyposis of colon   . Restless  leg syndrome   . Urinary, incontinence, stress female     PAST SURGICAL HISTORY :   Past Surgical History:  Procedure Laterality Date  . ABDOMINAL HYSTERECTOMY  1956  . APPENDECTOMY    . AXILLARY LYMPH NODE BIOPSY Left 03/18/2015   Procedure: AXILLARY LYMPH NODE BIOPSY;  Surgeon: [name], MD;  Location: [name];  Service: General;  Laterality: Left;  . CARDIAC CATHETERIZATION  08/19/2012   [name]; [place]  . COLONOSCOPY    . COLONOSCOPY WITH PROPOFOL  N/A 03/02/2016   Procedure: COLONOSCOPY WITH PROPOFOL;  Surgeon: Manya Silvas, MD;  Location: [facility] ENDOSCOPY;  Service: Endoscopy;  Laterality: N/A;  . COLONOSCOPY WITH PROPOFOL N/A 09/26/2017   Procedure: COLONOSCOPY WITH PROPOFOL;  Surgeon: Manya Silvas, MD;  Location[facility] END[facility]COPY;  Service: Endoscopy;  Laterality: N/A;  . CORONARY ANGIOPLASTY WITH STENT PLACEMENT     x3 stents  . CORONARY ANGIOPLASTY WITH STENT PLACEMENT    . CORONARY ARTERY BYPASS GRAFT    . ESOPHAGOGASTRODUODENOSCOPY (EGD) WITH PROPOFOL N/A 03/02/2016   Procedure: ESOPHAGOGASTRODUODENOSCOPY (EGD) WITH PROPOFOL;  Surgeon: Manya Silvas, MD;  Location: [facility] ENDOSCOPY;  Service: Endoscopy;  Laterality: N/A;  . LYMPH NODE BIOPSY Right 07-24-11   Dr Bary Castilla  . PTCA    . skin cancer removal    . TOTAL ABDOMINAL HYSTERECTOMY W/ BILATERAL SALPINGOOPHORECTOMY      FAMILY HISTORY :   Family History  Problem Relation Age of Onset  . Heart attack Father   . Heart disease Brother   . Leukemia Mother   . Bladder Cancer Neg Hx   . Prostate cancer Neg Hx   . Kidney cancer Neg Hx   . Breast cancer Neg Hx     SOCIAL HISTORY:   Social History   Tobacco Use  . Smoking status: Former Smoker    Types: Cigarettes    Last attempt to quit: 1950    Years since quitting: 69.3  . Smokeless tobacco: Never Used  Substance Use Topics  . Alcohol use: No    Alcohol/week: 0.0 oz  . Drug use: No    ALLERGIES:  is allergic to ace inhibitors; benadryl [diphenhydramine hcl]; codeine; diphenhydramine; guaiacol; hydrocodone; and guaifenesin & derivatives.  MEDICATIONS:  Current Outpatient Medications  Medication Sig Dispense Refill  . acetaminophen (TYLENOL) 650 MG CR tablet Take 650 mg by mouth every 8 (eight) hours as needed for pain.    [name] aspirin 81 MG tablet Take 81 mg by mouth every morning.     . carvedilol (COREG) 3.125 MG tablet TAKE 1 TABLET(3.125 MG) BY MOUTH TWICE DAILY WITH A MEAL 60 tablet 3  . Cholecalciferol  (VITAMIN D-3) 5000 UNITS TABS Take 2,000 Int'l Units by mouth every morning.     . fexofenadine (ALLEGRA) 180 MG tablet Take 180 mg by mouth daily as needed for rhinitis.     . furosemide (LASIX) 20 MG tablet Take 1 tablet (20 mg total) by mouth daily. 30 tablet 6  . latanoprost (XALATAN) 0.005 % ophthalmic solution Place 1 drop into both eyes at bedtime.     [name] LORazepam (ATIVAN) 0.5 MG tablet Take 0.5 mg by mouth every 8 (eight) hours as needed for anxiety.     [name] losartan (COZAAR) 25 MG tablet TAKE 1/2 TABLET(12.5 MG) BY MOUTH DAILY AFTER BREAKFAST 30 tablet 0  . oxybutynin (DITROPAN-XL) 10 MG 24 hr tablet Take 1 tablet (10 mg total) by mouth daily. (Patient taking differently: Take 10 mg by mouth  daily. ) 30 tablet 3  . pantoprazole (PROTONIX) 40 MG tablet TAKE 1 TABLET(40 MG) BY MOUTH TWICE DAILY 60 tablet 3  . pregabalin (LYRICA) 50 MG capsule Take 50 mg by mouth at bedtime as needed (restless leg).     . rosuvastatin (CRESTOR) 10 MG tablet TAKE 1 TABLET(10 MG) BY MOUTH DAILY (Patient taking differently: TAKE 1 TABLET(10 MG) BY MOUTH every other day) 30 tablet 3  . timolol (TIMOPTIC) 0.5 % ophthalmic solution Place 1 drop into both eyes 2 (two) times daily.      No current facility-administered medications for this visit.     PHYSICAL EXAMINATION: ECOG PERFORMANCE STATUS: 0 - Asymptomatic  BP 120/65 (BP Location: Left Arm, Patient Position: Sitting)   Pulse 74   Temp (!) 97.2 F (36.2 C) (Tympanic)   Resp 16   Wt 111 lb (50.3 kg)   BMI 20.97 kg/m   Filed Weights   12/06/17 1523  Weight: 111 lb (50.3 kg)    GENERAL: Well-nourished well-developed; Alert, no distress and comfortable.   Accompanied by her son. ES: no pallor or icterus OROPHARYNX: no thrush or ulceration; good dentition  NECK: supple, no masses felt LYMPH:  no palpable lymphadenopathy in the cervical,  or inguinal regions. Bilateral axillary adenopathy 1-2 cm in size.  LUNGS: clear to auscultation and  No wheeze  or crackles HEART/CVS: regular rate & rhythm and no murmurs; No lower extremity edema ABDOMEN:abdomen soft, non-tender and normal bowel sounds Musculoskeletal:no cyanosis of digits and no clubbing  PSYCH: alert & oriented x 3 with fluent speech NEURO: no focal motor/sensory deficits SKIN:  no rashes or significant lesions  LABORATORY DATA:  I have reviewed the data as listed    Component Value Date/Time   NA 139 12/06/2017 1450   NA 141 09/07/2014 1449   K 3.9 12/06/2017 1450   K 4.0 09/07/2014 1449   CL 106 12/06/2017 1450   CL 106 09/07/2014 1449   CO2 25 12/06/2017 1450   CO2 29 09/07/2014 1449   GLUCOSE 114 (H) 12/06/2017 1450   GLUCOSE 109 (H) 09/07/2014 1449   BUN 26 (H) 12/06/2017 1450   BUN 19 (H) 09/07/2014 1449   CREATININE 0.84 12/06/2017 1450   CREATININE 1.01 09/07/2014 1449   CALCIUM 9.5 12/06/2017 1450   CALCIUM 8.8 09/07/2014 1449   PROT 6.8 12/06/2017 1450   PROT 6.3 09/02/2015 0824   PROT 6.5 09/07/2014 1449   ALBUMIN 4.1 12/06/2017 1450   ALBUMIN 4.2 09/02/2015 0824   ALBUMIN 3.8 09/07/2014 1449   AST 14 (L) 12/06/2017 1450   AST 11 (L) 09/07/2014 1449   ALT 10 (L) 12/06/2017 1450   ALT 22 09/07/2014 1449   ALKPHOS 50 12/06/2017 1450   ALKPHOS 75 09/07/2014 1449   BILITOT 0.9 12/06/2017 1450   BILITOT 0.4 09/02/2015 0824   BILITOT 0.8 09/07/2014 1449   GFRNONAA >60 12/06/2017 1450   GFRNONAA 56 (L) 09/07/2014 1449   GFRNONAA 44 (L) 02/09/2014 1351   GFRAA >60 12/06/2017 1450   GFRAA >60 09/07/2014 1449   GFRAA 51 (L) 02/09/2014 1351    No results found for: SPEP, UPEP  Lab Results  Component Value Date   WBC 6.2 12/06/2017   NEUTROABS 2.2 12/06/2017   HGB 12.3 12/06/2017   HCT 35.2 12/06/2017   MCV 95.1 12/06/2017   PLT 143 (L) 12/06/2017      Chemistry      Component Value Date/Time   NA 139 12/06/2017 1450  NA 141 09/07/2014 1449   K 3.9 12/06/2017 1450   K 4.0 09/07/2014 1449   CL 106 12/06/2017 1450   CL 106 09/07/2014  1449   CO2 25 12/06/2017 1450   CO2 29 09/07/2014 1449   BUN 26 (H) 12/06/2017 1450   BUN 19 (H) 09/07/2014 1449   CREATININE 0.84 12/06/2017 1450   CREATININE 1.01 09/07/2014 1449      Component Value Date/Time   CALCIUM 9.5 12/06/2017 1450   CALCIUM 8.8 09/07/2014 1449   ALKPHOS 50 12/06/2017 1450   ALKPHOS 75 09/07/2014 1449   AST 14 (L) 12/06/2017 1450   AST 11 (L) 09/07/2014 1449   ALT 10 (L) 12/06/2017 1450   ALT 22 09/07/2014 1449   BILITOT 0.9 12/06/2017 1450   BILITOT 0.4 09/02/2015 0824   BILITOT 0.8 09/07/2014 1449     IMPRESSION: 1. Continued scattered adenopathy in the neck, chest, abdomen, and pelvis, similar to minimally increased in size, and similar to minimally increased in metabolic activity, currently [place] 3 and Deauville 4. 2. Other imaging findings of potential clinical significance: Coronary, aortic arch, and branch vessel atherosclerotic vascular disease. Aortoiliac atherosclerotic vascular disease. Hepatic cysts.   Electronically Signed   By: Van Clines M.D.   On: 12/01/2016 13:49  RADIOGRAPHIC STUDIES: I have personally reviewed the radiological images as listed and agreed with the findings in the report. Nm Pet Image Restag (ps) Skull Base To Thigh  Result Date: 12/06/2017 CLINICAL DATA:  Subsequent treatment strategy for lymphoma. EXAM: NUCLEAR MEDICINE PET SKULL BASE TO THIGH TECHNIQUE: 6.4 mCi F-18 FDG was injected intravenously. Full-ring PET imaging was performed from the skull base to thigh after the radiotracer. CT data was obtained and used for attenuation correction and anatomic localization. Fasting blood glucose: 92 mg/dl COMPARISON:  12/01/2016. FINDINGS: Mediastinal blood pool activity: SUV max 2.4 NECK: No hypermetabolic lymph nodes in the neck. A 9 mm short axis lymph node inferior to the left parotid gland has an SUV max of 2.7, similar. Incidental CT findings: None. CHEST: Mediastinal lymph nodes measure up to 1.7 cm  in the low right paratracheal station, with an SUV max of 3.1, similar. Numerous small to enlarged bilateral subpectoral and axillary lymph nodes. Index left subpectoral lymph node measures 1.4 cm in short axis (CT image 56) with an SUV max of 3.4, stable. No hypermetabolic pulmonary nodules. Incidental CT findings: Atherosclerotic calcification of the arterial vasculature, including three-vessel involvement of the coronary arteries. Heart is mildly enlarged. No pericardial or pleural effusion. ABDOMEN/PELVIS: No abnormal hypermetabolism in the liver, adrenal glands, spleen or pancreas. There are hypermetabolic enlarged lymph nodes throughout the abdominal and pelvic retroperitoneum. Index left periaortic lymph node measures 1.8 cm (CT image 145), stable, with an SUV max of 3.6, previously 4.3. Index left external iliac lymph node measures 1.3 cm (image 189) with an SUV max of 3.3, similar. Incidental CT findings: No acute findings. SKELETON: No abnormal osseous hypermetabolism. Incidental CT findings: Degenerative changes in the spine. IMPRESSION: 1. Mildly hypermetabolic left neck, mediastinal, axillary and abdominal/pelvic retroperitoneal adenopathy, similar to 12/01/2016. 2. Persistent hypermetabolism associated with a left parotid nodule. 3. Aortic atherosclerosis (ICD10-170.0). Three-vessel coronary artery calcification. Electronically Signed   By: Lorin Picket M.D.   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 Status post multiple lines of therapy in the past. April 2019- PET scan shows small lymphadenopathy above and  below diaphragm. Low FDG activity.   #I again discussed multiple treatment options available for the treatment of low-grade lymphoma at this time including but not limited to prednisone; repeat Rituxan; Gazyva; and ibrutinib etc; Also rituxan- Lenalidomide.   However, given the absence of any  symptoms I would recommend continued surveillance at this time.  # Mild thrombocytopenia- 143 monitor for now.  # fatigue- ? Etiology; TSH normal.  Referral to [facility] care program.   # Follow-up in approximately 6 months with labs. Care program.   # I reviewed the blood work- with the patient in detail; also reviewed the imaging independently [as summarized above]; and with the patient in detail.     Orders Placed This Encounter  Procedures  . CBC with Differential/Platelet    Standing Status:   Future    Standing Expiration Date:   12/07/2018  . Comprehensive metabolic panel    Standing Status:   Future    Standing Expiration Date:   12/07/2018  . Lactate dehydrogenase    Standing Status:   Future    Standing Expiration Date:   12/07/2018  . AMB REFERRAL TO [facility] CARE    Referral Priority:   Routine    Referral Type:   Consultation    Number of Visits Requested:   1   All questions were answered. The patient knows to call the clinic with any problems, questions or concerns.      Cammie Sickle, MD 12/06/2017 3:56 PM

## 2017-12-06 NOTE — Assessment & Plan Note (Addendum)
#   LOW Grade lymphoma- B-cell lymphoma. Status post multiple lines of therapy in the past. April 2019- PET scan shows small lymphadenopathy above and below diaphragm. Low FDG activity.   #I again discussed multiple treatment options available for the treatment of low-grade lymphoma at this time including but not limited to prednisone; repeat Rituxan; Gazyva; and ibrutinib etc; Also rituxan- Lenalidomide.   However, given the absence of any symptoms I would recommend continued surveillance at this time.  # Mild thrombocytopenia- 143 monitor for now.  # fatigue- ? Etiology; TSH normal.  Referral to [facility] care program.   # Follow-up in approximately 6 months with labs. Care program.   # I reviewed the blood work- with the patient in detail; also reviewed the imaging independently [as summarized above]; and with the patient in detail.

## 2017-12-07 ENCOUNTER — Other Ambulatory Visit: Payer: Medicare Other

## 2017-12-07 ENCOUNTER — Ambulatory Visit: Payer: Medicare Other | Admitting: Internal Medicine

## 2018-01-14 ENCOUNTER — Other Ambulatory Visit: Payer: Self-pay | Admitting: Cardiovascular Disease

## 2018-01-15 NOTE — Telephone Encounter (Signed)
Called patient.  Patient verified that she is currently taking Crestor Every other day.  I will send in enough to get patient to her next appointment.

## 2018-01-15 NOTE — Telephone Encounter (Signed)
Please advise if patient should be taking medication eery day or every other day. Patient reported at most recent office visit she was only taking medication every other day.

## 2018-01-15 NOTE — Telephone Encounter (Signed)
Please verify with her if she is currently only taking 1 tablet every other day. If so, it is ok to refill for 1 tablet every other day until she is seen in follow up by Dr. Fletcher Anon.   Thanks!

## 2018-02-24 ENCOUNTER — Other Ambulatory Visit: Payer: Self-pay | Admitting: Cardiovascular Disease

## 2018-03-10 ENCOUNTER — Other Ambulatory Visit: Payer: Self-pay | Admitting: Cardiovascular Disease

## 2018-03-27 ENCOUNTER — Other Ambulatory Visit: Payer: Self-pay | Admitting: Cardiovascular Disease

## 2018-04-04 ENCOUNTER — Encounter: Payer: Self-pay | Admitting: Cardiovascular Disease

## 2018-04-04 ENCOUNTER — Ambulatory Visit (INDEPENDENT_AMBULATORY_CARE_PROVIDER_SITE_OTHER): Payer: Medicare Other | Admitting: Cardiovascular Disease

## 2018-04-04 VITALS — BP 124/60 | HR 61 | Ht 62.0 in | Wt 107.0 lb

## 2018-04-04 DIAGNOSIS — I2510 Atherosclerotic heart disease of native coronary artery without angina pectoris: Secondary | ICD-10-CM | POA: Diagnosis not present

## 2018-04-04 DIAGNOSIS — I255 Ischemic cardiomyopathy: Secondary | ICD-10-CM

## 2018-04-04 DIAGNOSIS — E785 Hyperlipidemia, unspecified: Secondary | ICD-10-CM

## 2018-04-04 NOTE — Progress Notes (Signed)
Cardiology Office Note   Date:  04/04/2018   ID:  Jeanne Pittman, DOB 09-25-1934, MRN [id number]  PCP:  [name], MD  Cardiologist:   Kathlyn Sacramento, MD   Chief Complaint  Patient presents with  . other    4-6 month f/u c/o sob. Meds reviewed verbally with pt.      History of Present Illness: Jeanne Pittman is a 83 y.o. female who presents for a followup visit regarding coronary artery disease and chronic systolic heart failure due to ischemic cardiomyopathy. She had anterior ST elevation myocardial infarction in December 2013 with late presentation complicated by cardiogenic shock. Emergent cardiac catheterization showed an occluded mid LAD and 95% stenosis in the proximal RCA. 2 drug-eluting stents were placed in the mid LAD and 1 drug-eluting stent to the proximal RCA. Ejection fraction was 25-30% with akinesis of the mid distal anterior, apical and distal inferior wall.  Most recent ejection fraction was 45-50% in September, 2014. She has known history of non-Hodgkin's lymphoma which is being observed. She has known history of myalgia with atorvastatin but she has been tolerating rosuvastatin.  She had worsening fatigue and exertional dyspnea last year.  Carlton Adam Myoview was done in August 2018 which showed evidence of prior anterior infarct without significant ischemia. She had episodes of depression but overall has improved and started attending cardiac rehab extended program twice weekly.  She has rare episodes of palpitations.  She was found to have few lymph nodes which are currently being observed.  Past Medical History:  Diagnosis Date  . A-fib ([place])    07/2012: A. fib with RVR in the setting of myocardial infarction. Converted to sinus rhythm with amiodarone. No recurrence.  . Anxiety   . Atrophic vaginitis   . Chronic combined systolic (congestive) and diastolic (congestive) heart failure ([place])    a. EF was 25-35% post MI but improved to 45-50% in 04/2013;  b. 03/2017 Echo: EF 40-45%, mod focal/basal hypertrophy of septum. Sev mid-apicalanteroseptal, ant, and apical HK. Gr1 DD, mild AI/MR, mod TR, PASP 80mHg.  . Colon adenomas   . Coronary artery disease    a. 37/9024 STEMI complicated by cardiogenic shock. Cath/PCI: LAD 18m (DESx2), RCA 95p(DES). EF 25%; b. 03/2017 MV: EF 57%, fixed apical, periapical, mid to distal anteroseptal defect. No ischemia.  [name] Dysrhythmia    A-fib  . Fibrocystic breast disease   . GERD (gastroesophageal reflux disease)   . Glaucoma   . History of gastritis   . Hyperlipidemia   . Hypertension   . Insomnia   . Ischemic cardiomyopathy    a. 07/2012 EF 25-35% following MI-->Improved to 45-50%;  b. 03/2017 Echo: EF 40-45%.  . Myocardial infarction ([place]) 08/18/12  . Non Hodgkin's lymphoma ([place]) 2005   reoccurance 2007 and 2012  . Osteoarthritis   . Polyneuropathy   . Polyposis of colon   . Restless leg syndrome   . Urinary, incontinence, stress female     Past Surgical History:  Procedure Laterality Date  . ABDOMINAL HYSTERECTOMY  1956  . APPENDECTOMY    . AXILLARY LYMPH NODE BIOPSY Left 03/18/2015   Procedure: AXILLARY LYMPH NODE BIOPSY;  Surgeon: [name], MD;  Location: [name];  Service: General;  Laterality: Left;  . CARDIAC CATHETERIZATION  08/19/2012   [name]; [facility]  . COLONOSCOPY    . COLONOSCOPY WITH PROPOFOL N/A 03/02/2016   Procedure: COLONOSCOPY WITH PROPOFOL;  Surgeon: Manya Silvas, MD;  Location: [facility] ENDOSCOPY;  Service:  Endoscopy;  Laterality: N/A;  . COLONOSCOPY WITH PROPOFOL N/A 09/26/2017   Procedure: COLONOSCOPY WITH PROPOFOL;  Surgeon: Manya Silvas, MD;  Location: [facility] ENDOSCOPY;  Service: Endoscopy;  Laterality: N/A;  . CORONARY ANGIOPLASTY WITH STENT PLACEMENT     x3 stents  . CORONARY ANGIOPLASTY WITH STENT PLACEMENT    . CORONARY ARTERY BYPASS GRAFT    . ESOPHAGOGASTRODUODENOSCOPY (EGD) WITH PROPOFOL N/A 03/02/2016   Procedure: ESOPHAGOGASTRODUODENOSCOPY (EGD) WITH  PROPOFOL;  Surgeon: Manya Silvas, MD;  Location: [facility] ENDOSCOPY;  Service: Endoscopy;  Laterality: N/A;  . LYMPH NODE BIOPSY Right 07-24-11   Dr Bary Castilla  . MOHS SURGERY     bilateral shoulders  . PTCA    . skin cancer removal    . TOTAL ABDOMINAL HYSTERECTOMY W/ BILATERAL SALPINGOOPHORECTOMY       Current Outpatient Medications  Medication Sig Dispense Refill  . acetaminophen (TYLENOL) 650 MG CR tablet Take 650 mg by mouth every 8 (eight) hours as needed for pain.    [name] aspirin 81 MG tablet Take 81 mg by mouth every morning.     . carvedilol (COREG) 3.125 MG tablet TAKE 1 TABLET(3.125 MG) BY MOUTH TWICE DAILY WITH A MEAL 60 tablet 3  . Cholecalciferol (VITAMIN D-3) 5000 UNITS TABS Take 2,000 Int'l Units by mouth every morning.     . fexofenadine (ALLEGRA) 180 MG tablet Take 180 mg by mouth daily as needed for rhinitis.     . Fluocinolone Acetonide Scalp 0.01 % OIL as needed.    . furosemide (LASIX) 20 MG tablet Take 1 tablet (20 mg total) by mouth daily. (Patient taking differently: Take 20 mg by mouth daily as needed. ) 30 tablet 6  . gabapentin (NEURONTIN) 300 MG capsule Take 300 mg by mouth as needed.    . latanoprost (XALATAN) 0.005 % ophthalmic solution Place 1 drop into both eyes at bedtime.     [name] LORazepam (ATIVAN) 0.5 MG tablet Take 0.5 mg by mouth every 8 (eight) hours as needed for anxiety.     [name] losartan (COZAAR) 25 MG tablet TAKE 1/2 TABLET(12.5 MG) BY MOUTH DAILY AFTER BREAKFAST (Patient taking differently: Take 25 mg by mouth daily. ) 15 tablet 8  . mupirocin ointment (BACTROBAN) 2 % Apply topically as needed.    [name] oxybutynin (DITROPAN-XL) 10 MG 24 hr tablet Take 1 tablet (10 mg total) by mouth daily. (Patient taking differently: Take 10 mg by mouth daily. ) 30 tablet 3  . pantoprazole (PROTONIX) 40 MG tablet TAKE 1 TABLET(40 MG) BY MOUTH TWICE DAILY 60 tablet 0  . rosuvastatin (CRESTOR) 10 MG tablet TAKE 1 TABLET(10 MG) BY MOUTH every other day 45 tablet 0  . timolol  (TIMOPTIC) 0.5 % ophthalmic solution Place 1 drop into both eyes 2 (two) times daily.     [name] triamcinolone ointment (KENALOG) 0.1 % as needed.     No current facility-administered medications for this visit.     Allergies:   Ace inhibitors; Benadryl [diphenhydramine hcl]; Codeine; Diphenhydramine; Guaiacol; Hydrocodone; and Guaifenesin & derivatives    Social History:  The patient  reports that she quit smoking about 69 years ago. Her smoking use included cigarettes. She has never used smokeless tobacco. She reports that she does not drink alcohol or use drugs.   Family History:  The patient's family history includes Heart attack in her father; Heart disease in her brother; Leukemia in her mother.    ROS:  Please see the history of present illness.  Otherwise, review of systems are positive for none.   All other systems are reviewed and negative.    PHYSICAL EXAM: VS:  BP 124/60 (BP Location: Left Arm, Patient Position: Sitting, Cuff Size: Normal)   Pulse 61   Ht 5\' 2"  (1.575 m)   Wt 107 lb (48.5 kg)   BMI 19.57 kg/m  , BMI Body mass index is 19.57 kg/m. GEN: Well nourished, well developed, in no acute distress  HEENT: normal  Neck: no JVD, carotid bruits, or masses Cardiac: RRR; no murmurs, rubs, or gallops,no edema  Respiratory:  clear to auscultation bilaterally, normal work of breathing GI: soft, nontender, nondistended, + BS MS: no deformity or atrophy  Skin: warm and dry, no rash Neuro:  Strength and sensation are intact Psych: euthymic mood, full affect   EKG:  EKG is ordered today. The ekg ordered today demonstrates normal sinus rhythm with old anterior infarct, LVH with repolarization abnormalities.   Recent Labs: 06/07/2017: TSH 2.978 12/06/2017: ALT 10; BUN 26; Creatinine, Ser 0.84; Hemoglobin 12.3; Platelets 143; Potassium 3.9; Sodium 139    Lipid Panel    Component Value Date/Time   CHOL 172 09/02/2015 0824   TRIG 111 09/02/2015 0824   HDL 51 09/02/2015  0824   CHOLHDL 3.4 09/02/2015 0824   LDLCALC 99 09/02/2015 0824      Wt Readings from Last 3 Encounters:  04/04/18 107 lb (48.5 kg)  12/06/17 111 lb (50.3 kg)  11/29/17 110 lb 12 oz (50.2 kg)        ASSESSMENT AND PLAN:  1.  Coronary artery disease involving native coronary arteries without angina: She is doing well overall with improvement in symptoms especially with starting an exercise program.  Carlton Adam Myoview last year showed no ischemia.  Continue medical therapy.   The patient is thinking about having an eye surgery to improve her vision.  If absolutely needed to stop aspirin and that can be done for 5 days.  If bleeding risk is not too high, I recommend continuing low-dose aspirin without interruption.  2. Hyperlipidemia: She is tolerating rosuvastatin.  Most recent LDL was 67.  3. Ischemic cardiomyopathy: Most recent ejection fraction was 45 to 50 %. Continue low-dose carvedilol and losartan. She is euvolemic.  4.  Palpitations: She reports improvement in symptoms overall without intervention.   Disposition:   FU with me in 6 months  Signed,  Kathlyn Sacramento, MD  04/04/2018 11:01 AM    [place]

## 2018-04-04 NOTE — Patient Instructions (Signed)
Medication Instructions: Your physician recommends that you continue on your current medications as directed. Please refer to the Current Medication list given to you today.  If you need a refill on your cardiac medications before your next appointment, please call your pharmacy.   Follow-Up: Your physician wants you to follow-up in 6 months with Dr. Arida. You will receive a reminder letter in the mail two months in advance. If you don't receive a letter, please call our office at [phone] to schedule this follow-up appointment.  Thank you for choosing [facility]!    

## 2018-04-12 ENCOUNTER — Other Ambulatory Visit: Payer: Self-pay | Admitting: Cardiovascular Disease

## 2018-04-12 DIAGNOSIS — R0602 Shortness of breath: Secondary | ICD-10-CM

## 2018-04-12 DIAGNOSIS — I5022 Chronic systolic (congestive) heart failure: Secondary | ICD-10-CM

## 2018-04-19 ENCOUNTER — Other Ambulatory Visit: Payer: Self-pay | Admitting: *Deleted

## 2018-04-19 DIAGNOSIS — I5022 Chronic systolic (congestive) heart failure: Secondary | ICD-10-CM

## 2018-04-19 DIAGNOSIS — R0602 Shortness of breath: Secondary | ICD-10-CM

## 2018-04-29 ENCOUNTER — Other Ambulatory Visit: Payer: Self-pay | Admitting: Cardiovascular Disease

## 2018-05-31 ENCOUNTER — Other Ambulatory Visit: Payer: Self-pay | Admitting: Cardiovascular Disease

## 2018-06-07 ENCOUNTER — Inpatient Hospital Stay: Payer: Medicare Other | Attending: Internal Medicine

## 2018-06-07 ENCOUNTER — Inpatient Hospital Stay (HOSPITAL_BASED_OUTPATIENT_CLINIC_OR_DEPARTMENT_OTHER): Payer: Medicare Other | Admitting: Internal Medicine

## 2018-06-07 VITALS — BP 136/62 | HR 69 | Temp 97.1°F | Resp 16 | Wt 108.0 lb

## 2018-06-07 DIAGNOSIS — R5383 Other fatigue: Secondary | ICD-10-CM

## 2018-06-07 DIAGNOSIS — D696 Thrombocytopenia, unspecified: Secondary | ICD-10-CM | POA: Insufficient documentation

## 2018-06-07 DIAGNOSIS — C8384 Other non-follicular lymphoma, lymph nodes of axilla and upper limb: Secondary | ICD-10-CM

## 2018-06-07 DIAGNOSIS — C8304 Small cell B-cell lymphoma, lymph nodes of axilla and upper limb: Secondary | ICD-10-CM

## 2018-06-07 DIAGNOSIS — C8388 Other non-follicular lymphoma, lymph nodes of multiple sites: Secondary | ICD-10-CM

## 2018-06-07 DIAGNOSIS — F5089 Other specified eating disorder: Secondary | ICD-10-CM | POA: Insufficient documentation

## 2018-06-07 LAB — COMPREHENSIVE METABOLIC PANEL
ALBUMIN: 4.1 g/dL (ref 3.5–5.0)
ALK PHOS: 52 U/L (ref 38–126)
ALT: 11 U/L (ref 0–44)
AST: 16 U/L (ref 15–41)
Anion gap: 8 (ref 5–15)
BILIRUBIN TOTAL: 1 mg/dL (ref 0.3–1.2)
BUN: 23 mg/dL (ref 8–23)
CO2: 27 mmol/L (ref 22–32)
Calcium: 9.5 mg/dL (ref 8.9–10.3)
Chloride: 104 mmol/L (ref 98–111)
Creatinine, Ser: 0.84 mg/dL (ref 0.44–1.00)
GFR calc Af Amer: >60 mL/min (ref >60)
GLUCOSE: 113 mg/dL — AB (ref 70–99)
POTASSIUM: 4 mmol/L (ref 3.5–5.1)
Sodium: 139 mmol/L (ref 135–145)
TOTAL PROTEIN: 6.8 g/dL (ref 6.5–8.1)

## 2018-06-07 LAB — CBC WITH DIFFERENTIAL/PLATELET
Abs Immature Granulocytes: 0.02 10*3/uL (ref 0.00–0.07)
BASOS PCT: 1 %
Basophils Absolute: 0.1 10*3/uL (ref 0.0–0.1)
EOS ABS: 0.2 10*3/uL (ref 0.0–0.5)
Eosinophils Relative: 2 %
HCT: 36.8 % (ref 36.0–46.0)
Hemoglobin: 12.6 g/dL (ref 12.0–15.0)
IMMATURE GRANULOCYTES: 0 %
Lymphocytes Relative: 54 %
Lymphs Abs: 4.5 10*3/uL — ABNORMAL HIGH (ref 0.7–4.0)
MCH: 32.3 pg (ref 26.0–34.0)
MCHC: 34.2 g/dL (ref 30.0–36.0)
MCV: 94.4 fL (ref 80.0–100.0)
Monocytes Absolute: 0.4 10*3/uL (ref 0.1–1.0)
Monocytes Relative: 5 %
NEUTROS PCT: 38 %
NRBC: 0 % (ref 0.0–0.2)
Neutro Abs: 3.2 10*3/uL (ref 1.7–7.7)
PLATELETS: 147 10*3/uL — AB (ref 150–400)
RBC: 3.9 MIL/uL (ref 3.87–5.11)
RDW: 13.5 % (ref 11.5–15.5)
WBC: 8.4 10*3/uL (ref 4.0–10.5)

## 2018-06-07 LAB — LACTATE DEHYDROGENASE: LDH: 107 U/L (ref 98–192)

## 2018-06-07 NOTE — Progress Notes (Signed)
Patient here for follow-up for h/o lymphoma.  Pt c/o chronic low back pain. Made worse with "sitting for prolong periods, heavy house work and getting up and down from floor."  Described pain as "achy."  [place] NOTE  Patient Care Team: [name], MD as PCP - General (Unknown Physician Specialty) Wellington Hampshire, MD as PCP - Cardiology (Cardiology) Byrnett, Forest Gleason, MD (General Surgery) Forest Gleason, MD (Inactive) (Oncology) Wellington Hampshire, MD as Consulting Physician (Cardiology)  Cancer Staging No matching staging information was found for the patient.    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;  Rituxan once a week started in January of 2013  # July 2016-RECURRENCE- LYMPH NODE, LEFT AXILLA; EXCISIONAL BIOPSY:  - LOW-GRADE B-CELL LYMPHOMA, IMMUNOPHENOTYPICALLY MOST CONSISTENT WITH CLL/SLL, PET April 2018- STABLE/slight progression  # acute MI in December of 2014; #  upper and lower endoscopy done in May of 2015 ---------------------------------------------------   DIAGNOSIS: MARGINAL ZONE LYMPHOMA  STAGE:   IV   ;GOALS: control  CURRENT/MOST RECENT THERAPY : surveillance       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  female pleasant patient above history of recurrent low-grade lymphoma/B cell non-Hodgkin type is here for follow-up accompanied by her son.  Patient has poor appetite.  Continues to complain of fatigue.  Mild weight loss.  Denies any significant night sweats.  Denies any  new lumps or bumps.  No abdominal pain nausea vomiting.  Review of Systems  Constitutional: Positive for malaise/fatigue and weight loss. Negative for chills, diaphoresis and fever.  HENT: Negative for nosebleeds and sore throat.   Eyes: Negative for double vision.  Respiratory: Negative for cough, hemoptysis, sputum production, shortness of breath and wheezing.   Cardiovascular: Negative for chest pain, palpitations, orthopnea and leg swelling.  Gastrointestinal: Negative for abdominal pain, blood in stool, constipation, diarrhea, heartburn, melena, nausea and vomiting.  Genitourinary: Negative for dysuria, frequency and urgency.  Musculoskeletal: Negative for back pain and joint pain.  Skin: Negative.  Negative for itching and rash.  Neurological: Negative for dizziness, tingling, focal weakness, weakness and headaches.  Endo/Heme/Allergies: Does not bruise/bleed easily.  Psychiatric/Behavioral: Negative for depression. The patient is not nervous/anxious and does not have insomnia.     PAST MEDICAL HISTORY :  Past Medical History:  Diagnosis Date  . A-fib ([place])    07/2012: A. fib with RVR in the setting of myocardial infarction. Converted to sinus rhythm with amiodarone. No recurrence.  . Anxiety   . Atrophic vaginitis   . Chronic combined systolic (congestive) and diastolic (congestive) heart failure ([place])    a. EF was 25-35% post MI but improved to 45-50% in 04/2013; b. 03/2017 Echo: EF 40-45%, mod focal/basal hypertrophy of septum. Sev mid-apicalanteroseptal, ant, and apical HK. Gr1 DD, mild AI/MR, mod TR, PASP 89mHg.  . Colon adenomas   . Coronary artery disease    a. 98/9211 STEMI complicated by cardiogenic shock. Cath/PCI: LAD 170m (DESx2), RCA 95p(DES). EF 25%; b. 03/2017 MV: EF 57%, fixed apical, periapical,  mid to distal anteroseptal defect. No ischemia.  [name] Dysrhythmia    A-fib  . Fibrocystic breast disease   . GERD (gastroesophageal reflux disease)   . Glaucoma   . History  of gastritis   . Hyperlipidemia   . Hypertension   . Insomnia   . Ischemic cardiomyopathy    a. 07/2012 EF 25-35% following MI-->Improved to 45-50%;  b. 03/2017 Echo: EF 40-45%.  . Myocardial infarction ([place]) 08/18/12  . Non Hodgkin's lymphoma ([place]) 2005   reoccurance 2007 and 2012  . Osteoarthritis   . Polyneuropathy   . Polyposis of colon   . Restless leg syndrome   . Urinary, incontinence, stress female     PAST SURGICAL HISTORY :   Past Surgical History:  Procedure Laterality Date  . ABDOMINAL HYSTERECTOMY  1956  . APPENDECTOMY    . AXILLARY LYMPH NODE BIOPSY Left 03/18/2015   Procedure: AXILLARY LYMPH NODE BIOPSY;  Surgeon: [name], MD;  Location: [name];  Service: General;  Laterality: Left;  . CARDIAC CATHETERIZATION  08/19/2012   [name]; [facility]  . COLONOSCOPY    . COLONOSCOPY WITH PROPOFOL N/A 03/02/2016   Procedure: COLONOSCOPY WITH PROPOFOL;  Surgeon: Manya Silvas, MD;  Location: [facility] ENDOSCOPY;  Service: Endoscopy;  Laterality: N/A;  . COLONOSCOPY WITH PROPOFOL N/A 09/26/2017   Procedure: COLONOSCOPY WITH PROPOFOL;  Surgeon: Manya Silvas, MD;  Location: [facility] ENDOSCOPY;  Service: Endoscopy;  Laterality: N/A;  . CORONARY ANGIOPLASTY WITH STENT PLACEMENT     x3 stents  . CORONARY ANGIOPLASTY WITH STENT PLACEMENT    . CORONARY ARTERY BYPASS GRAFT    . ESOPHAGOGASTRODUODENOSCOPY (EGD) WITH PROPOFOL N/A 03/02/2016   Procedure: ESOPHAGOGASTRODUODENOSCOPY (EGD) WITH PROPOFOL;  Surgeon: Manya Silvas, MD;  Location: [facility] ENDOSCOPY;  Service: Endoscopy;  Laterality: N/A;  . LYMPH NODE BIOPSY Right 07-24-11   Dr Bary Castilla  . MOHS SURGERY     bilateral shoulders  . PTCA    . skin cancer removal    . TOTAL ABDOMINAL HYSTERECTOMY W/ BILATERAL SALPINGOOPHORECTOMY      FAMILY HISTORY :   Family History  Problem Relation Age of Onset  . Heart attack Father   . Heart disease Brother   . Leukemia Mother   . Bladder Cancer Neg Hx   . Prostate cancer Neg Hx   .  Kidney cancer Neg Hx   . Breast cancer Neg Hx     SOCIAL HISTORY:   Social History   Tobacco Use  . Smoking status: Former Smoker    Types: Cigarettes    Last attempt to quit: 1950    Years since quitting: 69.8  . Smokeless tobacco: Never Used  Substance Use Topics  . Alcohol use: No    Alcohol/week: 0.0 standard drinks  . Drug use: No    ALLERGIES:  is allergic to ace inhibitors; benadryl [diphenhydramine hcl]; codeine; diphenhydramine; guaiacol; hydrocodone; and guaifenesin & derivatives.  MEDICATIONS:  Current Outpatient Medications  Medication Sig Dispense Refill  . acetaminophen (TYLENOL) 650 MG CR tablet Take 650 mg by mouth every 8 (eight) hours as needed for pain.    [name] aspirin 81 MG tablet Take 81 mg by mouth every morning.     . carvedilol (COREG) 3.125 MG tablet TAKE 1 TABLET BY MOUTH TWICE DAILY WITH A MEAL 60 tablet 6  . Cholecalciferol (VITAMIN D-3) 5000 UNITS TABS Take 2,000 Int'l Units by mouth every morning.     . fexofenadine (ALLEGRA) 180 MG tablet Take 180 mg  by mouth daily as needed for rhinitis.     . Fluocinolone Acetonide Scalp 0.01 % OIL as needed.    . furosemide (LASIX) 20 MG tablet Take 1 tablet (20 mg total) by mouth daily. (Patient taking differently: Take 20 mg by mouth daily as needed. ) 30 tablet 6  . gabapentin (NEURONTIN) 300 MG capsule Take 300 mg by mouth as needed.    . latanoprost (XALATAN) 0.005 % ophthalmic solution Place 1 drop into both eyes at bedtime.     [name] LORazepam (ATIVAN) 0.5 MG tablet Take 0.5 mg by mouth every 8 (eight) hours as needed for anxiety.     [name] losartan (COZAAR) 25 MG tablet TAKE 1/2 TABLET(12.5 MG) BY MOUTH DAILY AFTER BREAKFAST (Patient taking differently: Take 25 mg by mouth daily. ) 15 tablet 8  . mupirocin ointment (BACTROBAN) 2 % Apply topically as needed.    [name] oxybutynin (DITROPAN-XL) 10 MG 24 hr tablet Take 1 tablet (10 mg total) by mouth daily. (Patient taking differently: Take 10 mg by mouth daily. ) 30 tablet 3   . pantoprazole (PROTONIX) 40 MG tablet TAKE 1 TABLET(40 MG) BY MOUTH TWICE DAILY 60 tablet 0  . rosuvastatin (CRESTOR) 10 MG tablet TAKE 1 TABLET(10 MG) BY MOUTH EVERY OTHER DAY 45 tablet 2  . timolol (TIMOPTIC) 0.5 % ophthalmic solution Place 1 drop into both eyes 2 (two) times daily.     [name] triamcinolone ointment (KENALOG) 0.1 % as needed.     No current facility-administered medications for this visit.     PHYSICAL EXAMINATION: ECOG PERFORMANCE STATUS: 0 - Asymptomatic  BP 136/62 (BP Location: Left Arm, Patient Position: Sitting)   Pulse 69   Temp (!) 97.1 F (36.2 C)   Resp 16   Wt 108 lb (49 kg)   BMI 19.75 kg/m   Filed Weights   06/07/18 1351  Weight: 108 lb (49 kg)    Physical Exam  Constitutional: She is oriented to person, place, and time and well-developed, well-nourished, and in no distress.  HENT:  Head: Normocephalic and atraumatic.  Mouth/Throat: Oropharynx is clear and moist. No oropharyngeal exudate.  Eyes: Pupils are equal, round, and reactive to light.  Neck: Normal range of motion. Neck supple.  Cardiovascular: Normal rate and regular rhythm.  Pulmonary/Chest: No respiratory distress. She has no wheezes.  Abdominal: Soft. Bowel sounds are normal. She exhibits no distension and no mass. There is no tenderness. There is no rebound and no guarding.  Musculoskeletal: Normal range of motion. She exhibits no edema or tenderness.  Lymphadenopathy:  Bilateral axillary adenopathy 1-2 cm in size.   Neurological: She is alert and oriented to person, place, and time.  Skin: Skin is warm.  Psychiatric: Affect normal.     LABORATORY DATA:  I have reviewed the data as listed    Component Value Date/Time   NA 139 06/07/2018 1332   NA 141 09/07/2014 1449   K 4.0 06/07/2018 1332   K 4.0 09/07/2014 1449   CL 104 06/07/2018 1332   CL 106 09/07/2014 1449   CO2 27 06/07/2018 1332   CO2 29 09/07/2014 1449   GLUCOSE 113 (H) 06/07/2018 1332   GLUCOSE 109 (H)  09/07/2014 1449   BUN 23 06/07/2018 1332   BUN 19 (H) 09/07/2014 1449   CREATININE 0.84 06/07/2018 1332   CREATININE 1.01 09/07/2014 1449   CALCIUM 9.5 06/07/2018 1332   CALCIUM 8.8 09/07/2014 1449   PROT 6.8 06/07/2018 1332   PROT 6.3 09/02/2015  0824   PROT 6.5 09/07/2014 1449   ALBUMIN 4.1 06/07/2018 1332   ALBUMIN 4.2 09/02/2015 0824   ALBUMIN 3.8 09/07/2014 1449   AST 16 06/07/2018 1332   AST 11 (L) 09/07/2014 1449   ALT 11 06/07/2018 1332   ALT 22 09/07/2014 1449   ALKPHOS 52 06/07/2018 1332   ALKPHOS 75 09/07/2014 1449   BILITOT 1.0 06/07/2018 1332   BILITOT 0.4 09/02/2015 0824   BILITOT 0.8 09/07/2014 1449   GFRNONAA >60 06/07/2018 1332   GFRNONAA 56 (L) 09/07/2014 1449   GFRNONAA 44 (L) 02/09/2014 1351   GFRAA >60 06/07/2018 1332   GFRAA >60 09/07/2014 1449   GFRAA 51 (L) 02/09/2014 1351    No results found for: SPEP, UPEP  Lab Results  Component Value Date   WBC 8.4 06/07/2018   NEUTROABS 3.2 06/07/2018   HGB 12.6 06/07/2018   HCT 36.8 06/07/2018   MCV 94.4 06/07/2018   PLT 147 (L) 06/07/2018      Chemistry      Component Value Date/Time   NA 139 06/07/2018 1332   NA 141 09/07/2014 1449   K 4.0 06/07/2018 1332   K 4.0 09/07/2014 1449   CL 104 06/07/2018 1332   CL 106 09/07/2014 1449   CO2 27 06/07/2018 1332   CO2 29 09/07/2014 1449   BUN 23 06/07/2018 1332   BUN 19 (H) 09/07/2014 1449   CREATININE 0.84 06/07/2018 1332   CREATININE 1.01 09/07/2014 1449      Component Value Date/Time   CALCIUM 9.5 06/07/2018 1332   CALCIUM 8.8 09/07/2014 1449   ALKPHOS 52 06/07/2018 1332   ALKPHOS 75 09/07/2014 1449   AST 16 06/07/2018 1332   AST 11 (L) 09/07/2014 1449   ALT 11 06/07/2018 1332   ALT 22 09/07/2014 1449   BILITOT 1.0 06/07/2018 1332   BILITOT 0.4 09/02/2015 0824   BILITOT 0.8 09/07/2014 1449      RADIOGRAPHIC STUDIES: I have personally reviewed the radiological images as listed and agreed with the findings in the report. No results  found.   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 Status post multiple lines of therapy in the past. April 2019- PET scan shows small lymphadenopathy above and below diaphragm. Low FDG activity; clinically stable.  #Again reviewed the treatment options for the patient including but not limited to-R-CVP; Gazyva based therapy; lenalidomide rituximab etc.  # mild weight loss/poor appetite-clinically do not suspect from underlying lymphoma.  Offered referral to dietitian.  Patient declines.  # Mild thrombocytopenia- 147 monitor for now.  Stable.  # Fatigue-chronic stable.  # DISPOSITION:  # Follow-up in approximately 6 months-MD with labs-cbc/cm/ldh; PET scan. Dr.B  # 25 minutes face-to-face with the patient discussing the above plan of care; more than 50% of time spent on prognosis/ natural history; counseling and coordination.    Orders Placed This Encounter  Procedures  . NM PET Image Restag (PS) Skull Base To Thigh    Standing Status:   Future    Standing Expiration Date:   06/07/2019    Order Specific Question:   ** REASON FOR EXAM (FREE TEXT)    Answer:   lymphoma follow up    Order Specific Question:   If indicated for the ordered procedure, I authorize the administration of a radiopharmaceutical per Radiology protocol    Answer:   Yes    Order Specific Question:   Preferred imaging location?  Answer:   [facility]    Order Specific Question:   Radiology Contrast Protocol - do NOT remove file path    Answer:   [email or web address]  . CBC with Differential/Platelet    Standing Status:   Future    Standing Expiration Date:   06/08/2019  . Comprehensive metabolic panel    Standing Status:   Future    Standing Expiration Date:   06/08/2019  . Lactate dehydrogenase    Standing Status:   Future    Standing Expiration Date:   06/08/2019   All questions were answered. The  patient knows to call the clinic with any problems, questions or concerns.      Cammie Sickle, MD 06/08/2018 11:20 AM

## 2018-06-07 NOTE — Assessment & Plan Note (Addendum)
#   LOW Grade lymphoma- B-cell lymphoma. Status post multiple lines of therapy in the past. April 2019- PET scan shows small lymphadenopathy above and below diaphragm. Low FDG activity; clinically stable.  #Again reviewed the treatment options for the patient including but not limited to-R-CVP; Gazyva based therapy; lenalidomide rituximab etc.  # mild weight loss/poor appetite-clinically do not suspect from underlying lymphoma.  Offered referral to dietitian.  Patient declines.  # Mild thrombocytopenia- 147 monitor for now.  Stable.  # Fatigue-chronic stable.  # DISPOSITION:  # Follow-up in approximately 6 months-MD with labs-cbc/cm/ldh; PET scan. Dr.B  # 25 minutes face-to-face with the patient discussing the above plan of care; more than 50% of time spent on prognosis/ natural history; counseling and coordination.

## 2018-06-11 ENCOUNTER — Encounter: Payer: Medicare Other | Attending: Cardiovascular Disease | Admitting: *Deleted

## 2018-06-11 ENCOUNTER — Encounter: Payer: Self-pay | Admitting: *Deleted

## 2018-06-11 VITALS — Ht 60.0 in | Wt 108.5 lb

## 2018-06-11 DIAGNOSIS — I5022 Chronic systolic (congestive) heart failure: Secondary | ICD-10-CM | POA: Insufficient documentation

## 2018-06-11 DIAGNOSIS — R0602 Shortness of breath: Secondary | ICD-10-CM | POA: Insufficient documentation

## 2018-06-11 NOTE — Progress Notes (Addendum)
Pulmonary Individual Treatment Plan  Patient Details  Name: Jeanne Pittman MRN: [id number] Date of Birth: June 30, 1935 Referring Provider:     Pulmonary Rehab from 06/11/2018 in [facility] Cardiac and Pulmonary Rehab  Referring Provider  Kathlyn Sacramento MD      Initial Encounter Date:    Pulmonary Rehab from 06/11/2018 in [facility] Cardiac and Pulmonary Rehab  Date  06/11/18      Visit Diagnosis: Chronic systolic heart failure ([place])  Patient's Home Medications on Admission:  Current Outpatient Medications:  .  acetaminophen (TYLENOL) 650 MG CR tablet, Take 650 mg by mouth every 8 (eight) hours as needed for pain., Disp: , Rfl:  .  aspirin 81 MG tablet, Take 81 mg by mouth every morning. , Disp: , Rfl:  .  carvedilol (COREG) 3.125 MG tablet, TAKE 1 TABLET BY MOUTH TWICE DAILY WITH A MEAL, Disp: 60 tablet, Rfl: 6 .  Cholecalciferol (VITAMIN D-3) 5000 UNITS TABS, Take 2,000 Int'l Units by mouth every morning. , Disp: , Rfl:  .  fexofenadine (ALLEGRA) 180 MG tablet, Take 180 mg by mouth daily as needed for rhinitis. , Disp: , Rfl:  .  Fluocinolone Acetonide Scalp 0.01 % OIL, as needed., Disp: , Rfl:  .  furosemide (LASIX) 20 MG tablet, Take 1 tablet (20 mg total) by mouth daily. (Patient taking differently: Take 20 mg by mouth daily as needed. ), Disp: 30 tablet, Rfl: 6 .  gabapentin (NEURONTIN) 300 MG capsule, Take 300 mg by mouth as needed., Disp: , Rfl:  .  latanoprost (XALATAN) 0.005 % ophthalmic solution, Place 1 drop into both eyes at bedtime. , Disp: , Rfl:  .  LORazepam (ATIVAN) 0.5 MG tablet, Take 0.5 mg by mouth every 8 (eight) hours as needed for anxiety. , Disp: , Rfl:  .  losartan (COZAAR) 25 MG tablet, TAKE 1/2 TABLET(12.5 MG) BY MOUTH DAILY AFTER BREAKFAST (Patient taking differently: Take 25 mg by mouth daily. ), Disp: 15 tablet, Rfl: 8 .  mupirocin ointment (BACTROBAN) 2 %, Apply topically as needed., Disp: , Rfl:  .  pantoprazole (PROTONIX) 40 MG tablet, TAKE 1 TABLET(40 MG) BY  MOUTH TWICE DAILY, Disp: 60 tablet, Rfl: 0 .  rosuvastatin (CRESTOR) 10 MG tablet, TAKE 1 TABLET(10 MG) BY MOUTH EVERY OTHER DAY, Disp: 45 tablet, Rfl: 2 .  timolol (TIMOPTIC) 0.5 % ophthalmic solution, Place 1 drop into both eyes 2 (two) times daily. , Disp: , Rfl:  .  triamcinolone ointment (KENALOG) 0.1 %, as needed., Disp: , Rfl:  .  oxybutynin (DITROPAN-XL) 10 MG 24 hr tablet, Take 1 tablet (10 mg total) by mouth daily. (Patient not taking: Reported on 06/11/2018), Disp: 30 tablet, Rfl: 3  Past Medical History: Past Medical History:  Diagnosis Date  . A-fib ([place])    07/2012: A. fib with RVR in the setting of myocardial infarction. Converted to sinus rhythm with amiodarone. No recurrence.  . Anxiety   . Atrophic vaginitis   . Chronic combined systolic (congestive) and diastolic (congestive) heart failure ([place])    a. EF was 25-35% post MI but improved to 45-50% in 04/2013; b. 03/2017 Echo: EF 40-45%, mod focal/basal hypertrophy of septum. Sev mid-apicalanteroseptal, ant, and apical HK. Gr1 DD, mild AI/MR, mod TR, PASP 60mg.  . Colon adenomas   . Coronary artery disease    a. 166/4403STEMI complicated by cardiogenic shock. Cath/PCI: LAD 109mDESx2), RCA 95p(DES). EF 25%; b. 03/2017 MV: EF 57%, fixed apical, periapical, mid to distal anteroseptal defect. No ischemia.  .[name]  Dysrhythmia    A-fib  . Fibrocystic breast disease   . GERD (gastroesophageal reflux disease)   . Glaucoma   . History of gastritis   . Hyperlipidemia   . Hypertension   . Insomnia   . Ischemic cardiomyopathy    a. 07/2012 EF 25-35% following MI-->Improved to 45-50%;  b. 03/2017 Echo: EF 40-45%.  . Myocardial infarction ([place]) 08/18/12  . Non Hodgkin's lymphoma ([place]) 2005   reoccurance 2007 and 2012  . Osteoarthritis   . Polyneuropathy   . Polyposis of colon   . Restless leg syndrome   . Urinary, incontinence, stress female     Tobacco Use: Social History   Tobacco Use  Smoking Status Former Smoker  . Types:  Cigarettes  . Last attempt to quit: 1950  . Years since quitting: 69.8  Smokeless Tobacco Never Used    Labs: Recent Review Scientist, physiological    Labs for ITP Cardiac and Pulmonary Rehab Latest Ref Rng & Units 04/07/2013 09/02/2015   Cholestrol 100 - 199 mg/dL 136 172   LDLCALC 0 - 99 mg/dL 58 99   HDL >39 mg/dL 54 51   Trlycerides 0 - 149 mg/dL 120 111       Pulmonary Assessment Scores: Pulmonary Assessment Scores    Row Name 06/11/18 1238         ADL UCSD   ADL Phase  Entry     SOB Score total  75     Rest  1     Walk  2     Stairs  3     Bath  3     Dress  3     Shop  4       CAT Score   CAT Score  22        Pulmonary Function Assessment:   Exercise Target Goals: Exercise Program Goal: Individual exercise prescription set using results from initial 6 min walk test and THRR while considering  patient's activity barriers and safety.   Exercise Prescription Goal: Initial exercise prescription builds to 30-45 minutes a day of aerobic activity, 2-3 days per week.  Home exercise guidelines will be given to patient during program as part of exercise prescription that the participant will acknowledge.  Activity Barriers & Risk Stratification: Activity Barriers & Cardiac Risk Stratification - 06/11/18 1320      Activity Barriers & Cardiac Risk Stratification   Activity Barriers  Decreased Ventricular Function;Deconditioning;Muscular Weakness;Shortness of Breath       6 Minute Walk: 6 Minute Walk    Row Name 06/11/18 1314         6 Minute Walk   Phase  Initial     Distance  1170 feet     Walk Time  6 minutes     # of Rest Breaks  0     MPH  2.22     METS  2.04     RPE  11     Perceived Dyspnea   1     VO2 Peak  7.15     Symptoms  No     Resting HR  68 bpm     Resting BP  122/70     Resting Oxygen Saturation   99 %     Exercise Oxygen Saturation  during 6 min walk  94 %     Max Ex. HR  86 bpm     Max Ex. BP  124/66     2 Minute Post  BP  128/72        Interval HR   1 Minute HR  76     2 Minute HR  83     3 Minute HR  82     4 Minute HR  84     5 Minute HR  84     6 Minute HR  86     2 Minute Post HR  75     Interval Heart Rate?  Yes       Interval Oxygen   Interval Oxygen?  Yes     Baseline Oxygen Saturation %  99 %     1 Minute Oxygen Saturation %  95 %     1 Minute Liters of Oxygen  0 L Room Air     2 Minute Oxygen Saturation %  95 %     2 Minute Liters of Oxygen  0 L     3 Minute Oxygen Saturation %  94 %     3 Minute Liters of Oxygen  0 L     4 Minute Oxygen Saturation %  95 %     4 Minute Liters of Oxygen  0 L     5 Minute Oxygen Saturation %  94 %     5 Minute Liters of Oxygen  0 L     6 Minute Oxygen Saturation %  95 %     6 Minute Liters of Oxygen  0 L     2 Minute Post Oxygen Saturation %  98 %     2 Minute Post Liters of Oxygen  0 L       Oxygen Initial Assessment: Oxygen Initial Assessment - 06/11/18 1250      Home Oxygen   Home Oxygen Device  None    Sleep Oxygen Prescription  None    Home Exercise Oxygen Prescription  None    Home at Rest Exercise Oxygen Prescription  None      Initial 6 min Walk   Oxygen Used  None      Program Oxygen Prescription   Program Oxygen Prescription  None      Intervention   Short Term Goals  To learn and understand importance of monitoring SPO2 with pulse oximeter and demonstrate accurate use of the pulse oximeter.;To learn and understand importance of maintaining oxygen saturations>88%;To learn and demonstrate proper pursed lip breathing techniques or other breathing techniques.    Long  Term Goals  Verbalizes importance of monitoring SPO2 with pulse oximeter and return demonstration;Maintenance of O2 saturations>88%;Exhibits proper breathing techniques, such as pursed lip breathing or other method taught during program session       Oxygen Re-Evaluation:   Oxygen Discharge (Final Oxygen Re-Evaluation):   Initial Exercise Prescription: Initial Exercise  Prescription - 06/11/18 1400      Date of Initial Exercise RX and Referring Provider   Date  06/11/18    Referring Provider  Kathlyn Sacramento MD      Treadmill   MPH  2.2    Grade  0.5    Minutes  15    METs  2.84      NuStep   Level  4    SPM  80    Minutes  15    METs  2.2      REL-XR   Level  2    Speed  50    Minutes  15    METs  2.2  Prescription Details   Frequency (times per week)  3    Duration  Progress to 45 minutes of aerobic exercise without signs/symptoms of physical distress      Intensity   THRR 40-80% of Max Heartrate  96-125    Ratings of Perceived Exertion  11-13    Perceived Dyspnea  0-4      Progression   Progression  Continue to progress workloads to maintain intensity without signs/symptoms of physical distress.      Resistance Training   Training Prescription  Yes    Weight  3 lbs    Reps  10-15       Perform Capillary Blood Glucose checks as needed.  Exercise Prescription Changes:  Exercise Prescription Changes    Row Name 06/11/18 1400             Response to Exercise   Blood Pressure (Admit)  122/70       Blood Pressure (Exercise)  124/66       Blood Pressure (Exit)  128/72       Heart Rate (Admit)  68 bpm       Heart Rate (Exercise)  86 bpm       Heart Rate (Exit)  75 bpm       Oxygen Saturation (Admit)  99 %       Oxygen Saturation (Exercise)  94 %       Oxygen Saturation (Exit)  98 %       Rating of Perceived Exertion (Exercise)  11       Perceived Dyspnea (Exercise)  1       Symptoms  none       Comments  walk test results          Exercise Comments:   Exercise Goals and Review:  Exercise Goals    Row Name 06/11/18 1429             Exercise Goals   Increase Physical Activity  Yes       Intervention  Provide advice, education, support and counseling about physical activity/exercise needs.;Develop an individualized exercise prescription for aerobic and resistive training based on initial evaluation  findings, risk stratification, comorbidities and participant's personal goals.       Expected Outcomes  Short Term: Attend rehab on a regular basis to increase amount of physical activity.;Long Term: Add in home exercise to make exercise part of routine and to increase amount of physical activity.;Long Term: Exercising regularly at least 3-5 days a week.       Increase Strength and Stamina  Yes       Intervention  Provide advice, education, support and counseling about physical activity/exercise needs.;Develop an individualized exercise prescription for aerobic and resistive training based on initial evaluation findings, risk stratification, comorbidities and participant's personal goals.       Expected Outcomes  Short Term: Increase workloads from initial exercise prescription for resistance, speed, and METs.;Short Term: Perform resistance training exercises routinely during rehab and add in resistance training at home;Long Term: Improve cardiorespiratory fitness, muscular endurance and strength as measured by increased METs and functional capacity (6MWT)       Able to understand and use rate of perceived exertion (RPE) scale  Yes       Intervention  Provide education and explanation on how to use RPE scale       Expected Outcomes  Short Term: Able to use RPE daily in rehab to express subjective intensity level;Long Term:  Able  to use RPE to guide intensity level when exercising independently       Able to understand and use Dyspnea scale  Yes       Intervention  Provide education and explanation on how to use Dyspnea scale       Expected Outcomes  Short Term: Able to use Dyspnea scale daily in rehab to express subjective sense of shortness of breath during exertion;Long Term: Able to use Dyspnea scale to guide intensity level when exercising independently       Knowledge and understanding of Target Heart Rate Range (THRR)  Yes       Intervention  Provide education and explanation of THRR including how  the numbers were predicted and where they are located for reference       Expected Outcomes  Short Term: Able to state/look up THRR;Short Term: Able to use daily as guideline for intensity in rehab;Long Term: Able to use THRR to govern intensity when exercising independently       Able to check pulse independently  Yes       Intervention  Provide education and demonstration on how to check pulse in carotid and radial arteries.;Review the importance of being able to check your own pulse for safety during independent exercise       Expected Outcomes  Short Term: Able to explain why pulse checking is important during independent exercise;Long Term: Able to check pulse independently and accurately       Understanding of Exercise Prescription  Yes       Intervention  Provide education, explanation, and written materials on patient's individual exercise prescription       Expected Outcomes  Short Term: Able to explain program exercise prescription;Long Term: Able to explain home exercise prescription to exercise independently          Exercise Goals Re-Evaluation :   Discharge Exercise Prescription (Final Exercise Prescription Changes): Exercise Prescription Changes - 06/11/18 1400      Response to Exercise   Blood Pressure (Admit)  122/70    Blood Pressure (Exercise)  124/66    Blood Pressure (Exit)  128/72    Heart Rate (Admit)  68 bpm    Heart Rate (Exercise)  86 bpm    Heart Rate (Exit)  75 bpm    Oxygen Saturation (Admit)  99 %    Oxygen Saturation (Exercise)  94 %    Oxygen Saturation (Exit)  98 %    Rating of Perceived Exertion (Exercise)  11    Perceived Dyspnea (Exercise)  1    Symptoms  none    Comments  walk test results       Nutrition:  Target Goals: Understanding of nutrition guidelines, daily intake of sodium <1548m, cholesterol <2045m calories 30% from fat and 7% or less from saturated fats, daily to have 5 or more servings of fruits and vegetables.  Biometrics: Pre  Biometrics - 06/11/18 1429      Pre Biometrics   Height  5' (1.524 m)    Weight  108 lb 8 oz (49.2 kg)    Waist Circumference  26 inches    Hip Circumference  35 inches    Waist to Hip Ratio  0.74 %    BMI (Calculated)  21.19    Single Leg Stand  21.44 seconds        Nutrition Therapy Plan and Nutrition Goals: Nutrition Therapy & Goals - 06/11/18 1254      Intervention Plan   Intervention  Prescribe, educate and counsel regarding individualized specific dietary modifications aiming towards targeted core components such as weight, hypertension, lipid management, diabetes, heart failure and other comorbidities.;Nutrition handout(s) given to patient.    Expected Outcomes  Short Term Goal: Understand basic principles of dietary content, such as calories, fat, sodium, cholesterol and nutrients.;Short Term Goal: A plan has been developed with personal nutrition goals set during dietitian appointment.;Long Term Goal: Adherence to prescribed nutrition plan.       Nutrition Assessments: Nutrition Assessments - 06/11/18 1238      MEDFICTS Scores   Pre Score  35       Nutrition Goals Re-Evaluation:   Nutrition Goals Discharge (Final Nutrition Goals Re-Evaluation):   Psychosocial: Target Goals: Acknowledge presence or absence of significant depression and/or stress, maximize coping skills, provide positive support system. Participant is able to verbalize types and ability to use techniques and skills needed for reducing stress and depression.   Initial Review & Psychosocial Screening: Initial Psych Review & Screening - 06/11/18 1251      Initial Review   Current issues with  Current Sleep Concerns;Current Stress Concerns    Source of Stress Concerns  Chronic Illness    Comments  Unable to sleep good every night. Tries not to take her lorazapam at night.  Health continues to be her biggest stressor.  She is able to do things she wants to do, but often needs to stop earlier than she  wants.       Family Dynamics   Good Support System?  No   Limited   Comments  Lives by her self.  Son lives in [place].  Relies on neighbors and friends to take care of each other.       Barriers   Psychosocial barriers to participate in program  The patient should benefit from training in stress management and relaxation.;Psychosocial barriers identified (see note)      Screening Interventions   Interventions  Encouraged to exercise;Program counselor consult;Provide feedback about the scores to participant    Expected Outcomes  Short Term goal: Utilizing psychosocial counselor, staff and physician to assist with identification of specific Stressors or current issues interfering with healing process. Setting desired goal for each stressor or current issue identified.;Long Term Goal: Stressors or current issues are controlled or eliminated.;Short Term goal: Identification and review with participant of any Quality of Life or Depression concerns found by scoring the questionnaire.;Long Term goal: The participant improves quality of Life and PHQ9 Scores as seen by post scores and/or verbalization of changes       Quality of Life Scores:  Scores of 19 and below usually indicate a poorer quality of life in these areas.  A difference of  2-3 points is a clinically meaningful difference.  A difference of 2-3 points in the total score of the Quality of Life Index has been associated with significant improvement in overall quality of life, self-image, physical symptoms, and general health in studies assessing change in quality of life.  PHQ-9: Recent Review Flowsheet Data    Depression screen [facility] 2/9 06/11/2018   Decreased Interest 1   Down, Depressed, Hopeless 1   PHQ - 2 Score 2   Altered sleeping 2   Tired, decreased energy 2   Change in appetite 3   Feeling bad or failure about yourself  1   Trouble concentrating 1   Moving slowly or fidgety/restless 0   Suicidal thoughts 0   PHQ-9 Score  11   Difficult doing  work/chores Not difficult at all     Interpretation of Total Score  Total Score Depression Severity:  1-4 = Minimal depression, 5-9 = Mild depression, 10-14 = Moderate depression, 15-19 = Moderately severe depression, 20-27 = Severe depression   Psychosocial Evaluation and Intervention:   Psychosocial Re-Evaluation:   Psychosocial Discharge (Final Psychosocial Re-Evaluation):   Education: Education Goals: Education classes will be provided on a weekly basis, covering required topics. Participant will state understanding/return demonstration of topics presented.  Learning Barriers/Preferences: Learning Barriers/Preferences - 06/11/18 1255      Learning Barriers/Preferences   Learning Barriers  Sight   wears glasses   Learning Preferences  None       Education Topics:  Initial Evaluation Education: - Verbal, written and demonstration of respiratory meds, oximetry and breathing techniques. Instruction on use of nebulizers and MDIs and importance of monitoring MDI activations.   Pulmonary Rehab from 06/11/2018 in [facility] Cardiac and Pulmonary Rehab  Date  06/11/18  Educator  [facility]  Instruction Review Code  1- Verbalizes Understanding      General Nutrition Guidelines/Fats and Fiber: -Group instruction provided by verbal, written material, models and posters to present the general guidelines for heart healthy nutrition. Gives an explanation and review of dietary fats and fiber.   Controlling Sodium/Reading Food Labels: -Group verbal and written material supporting the discussion of sodium use in heart healthy nutrition. Review and explanation with models, verbal and written materials for utilization of the food label.   Exercise Physiology & General Exercise Guidelines: - Group verbal and written instruction with models to review the exercise physiology of the cardiovascular system and associated critical values. Provides general exercise guidelines with  specific guidelines to those with heart or lung disease.    Aerobic Exercise & Resistance Training: - Gives group verbal and written instruction on the various components of exercise. Focuses on aerobic and resistive training programs and the benefits of this training and how to safely progress through these programs.   Flexibility, Balance, Mind/Body Relaxation: Provides group verbal/written instruction on the benefits of flexibility and balance training, including mind/body exercise modes such as yoga, pilates and tai chi.  Demonstration and skill practice provided.   Stress and Anxiety: - Provides group verbal and written instruction about the health risks of elevated stress and causes of high stress.  Discuss the correlation between heart/lung disease and anxiety and treatment options. Review healthy ways to manage with stress and anxiety.   Depression: - Provides group verbal and written instruction on the correlation between heart/lung disease and depressed mood, treatment options, and the stigmas associated with seeking treatment.   Exercise & Equipment Safety: - Individual verbal instruction and demonstration of equipment use and safety with use of the equipment.   Pulmonary Rehab from 06/11/2018 in [facility] Cardiac and Pulmonary Rehab  Date  06/11/18  Educator  [facility]  Instruction Review Code  1- Verbalizes Understanding      Infection Prevention: - Provides verbal and written material to individual with discussion of infection control including proper hand washing and proper equipment cleaning during exercise session.   Pulmonary Rehab from 06/11/2018 in [facility] Cardiac and Pulmonary Rehab  Date  06/11/18  Educator  [facility]  Instruction Review Code  1- Verbalizes Understanding      Falls Prevention: - Provides verbal and written material to individual with discussion of falls prevention and safety.   Pulmonary Rehab from 06/11/2018 in [facility] Cardiac and Pulmonary Rehab  Date  06/11/18   Educator  [facility]  Instruction Review Code  1- Verbalizes Understanding      Diabetes: - Individual verbal and written instruction to review signs/symptoms of diabetes, desired ranges of glucose level fasting, after meals and with exercise. Advice that pre and post exercise glucose checks will be done for 3 sessions at entry of program.   Chronic Lung Diseases: - Group verbal and written instruction to review updates, respiratory medications, advancements in procedures and treatments. Discuss use of supplemental oxygen including available portable oxygen systems, continuous and intermittent flow rates, concentrators, personal use and safety guidelines. Review proper use of inhaler and spacers. Provide informative websites for self-education.    Energy Conservation: - Provide group verbal and written instruction for methods to conserve energy, plan and organize activities. Instruct on pacing techniques, use of adaptive equipment and posture/positioning to relieve shortness of breath.   Triggers and Exacerbations: - Group verbal and written instruction to review types of environmental triggers and ways to prevent exacerbations. Discuss weather changes, air quality and the benefits of nasal washing. Review warning signs and symptoms to help prevent infections. Discuss techniques for effective airway clearance, coughing, and vibrations.   AED/CPR: - Group verbal and written instruction with the use of models to demonstrate the basic use of the AED with the basic ABC's of resuscitation.   Anatomy and Physiology of the Lungs: - Group verbal and written instruction with the use of models to provide basic lung anatomy and physiology related to function, structure and complications of lung disease.   Anatomy & Physiology of the Heart: - Group verbal and written instruction and models provide basic cardiac anatomy and physiology, with the coronary electrical and arterial systems. Review of Valvular  disease and Heart Failure   Cardiac Medications: - Group verbal and written instruction to review commonly prescribed medications for heart disease. Reviews the medication, class of the drug, and side effects.   Know Your Numbers and Risk Factors: -Group verbal and written instruction about important numbers in your health.  Discussion of what are risk factors and how they play a role in the disease process.  Review of Cholesterol, Blood Pressure, Diabetes, and BMI and the role they play in your overall health.   Sleep Hygiene: -Provides group verbal and written instruction about how sleep can affect your health.  Define sleep hygiene, discuss sleep cycles and impact of sleep habits. Review good sleep hygiene tips.    Other: -Provides group and verbal instruction on various topics (see comments)    Knowledge Questionnaire Score: Knowledge Questionnaire Score - 06/11/18 1239      Knowledge Questionnaire Score   Pre Score  15/18   Reviewed with patient       Core Components/Risk Factors/Patient Goals at Admission: Personal Goals and Risk Factors at Admission - 06/11/18 1255      Core Components/Risk Factors/Patient Goals on Admission    Weight Management  Yes;Weight Maintenance   Feels worse when weight gets too low   Intervention  Weight Management: Develop a combined nutrition and exercise program designed to reach desired caloric intake, while maintaining appropriate intake of nutrient and fiber, sodium and fats, and appropriate energy expenditure required for the weight goal.;Weight Management: Provide education and appropriate resources to help participant work on and attain dietary goals.    Admit Weight  108 lb 8 oz (49.2 kg)    Goal Weight: Short Term  108 lb (49 kg)    Goal Weight: Long Term  108 lb (49 kg)    Expected Outcomes  Short Term: Continue to assess and modify interventions until short term weight is achieved;Long Term: Adherence to nutrition and physical  activity/exercise program aimed toward attainment of established weight goal;Weight Maintenance: Understanding of the daily nutrition guidelines, which includes 25-35% calories from fat, 7% or less cal from saturated fats, less than 275m cholesterol, less than 1.5gm of sodium, & 5 or more servings of fruits and vegetables daily    Improve shortness of breath with ADL's  Yes    Intervention  Provide education, individualized exercise plan and daily activity instruction to help decrease symptoms of SOB with activities of daily living.    Expected Outcomes  Short Term: Improve cardiorespiratory fitness to achieve a reduction of symptoms when performing ADLs;Long Term: Be able to perform more ADLs without symptoms or delay the onset of symptoms    Heart Failure  Yes    Intervention  Provide a combined exercise and nutrition program that is supplemented with education, support and counseling about heart failure. Directed toward relieving symptoms such as shortness of breath, decreased exercise tolerance, and extremity edema.    Expected Outcomes  Improve functional capacity of life;Short term: Attendance in program 2-3 days a week with increased exercise capacity. Reported lower sodium intake. Reported increased fruit and vegetable intake. Reports medication compliance.;Short term: Daily weights obtained and reported for increase. Utilizing diuretic protocols set by physician.;Long term: Adoption of self-care skills and reduction of barriers for early signs and symptoms recognition and intervention leading to self-care maintenance.    Hypertension  Yes    Intervention  Provide education on lifestyle modifcations including regular physical activity/exercise, weight management, moderate sodium restriction and increased consumption of fresh fruit, vegetables, and low fat dairy, alcohol moderation, and smoking cessation.;Monitor prescription use compliance.    Expected Outcomes  Short Term: Continued assessment and  intervention until BP is < 140/941mHG in hypertensive participants. < 130/8034mG in hypertensive participants with diabetes, heart failure or chronic kidney disease.;Long Term: Maintenance of blood pressure at goal levels.    Lipids  Yes    Intervention  Provide education and support for participant on nutrition & aerobic/resistive exercise along with prescribed medications to achieve LDL <63m19mDL >40mg1m Expected Outcomes  Short Term: Participant states understanding of desired cholesterol values and is compliant with medications prescribed. Participant is following exercise prescription and nutrition guidelines.;Long Term: Cholesterol controlled with medications as prescribed, with individualized exercise RX and with personalized nutrition plan. Value goals: LDL < 63mg,23m > 40 mg.       Core Components/Risk Factors/Patient Goals Review:    Core Components/Risk Factors/Patient Goals at Discharge (Final Review):    ITP Comments: ITP Comments    Row Name 06/11/18 1236           ITP Comments  Medical Evaluation completed. Chart sent for review and changes to Dr. Mark MEmily [facility]          Comments: Initial ITP

## 2018-06-11 NOTE — Patient Instructions (Signed)
Patient Instructions  Patient Details  Name: Jeanne Pittman MRN: [id number] Date of Birth: 03-25-1935 Referring Provider:  Wellington Hampshire, MD  Below are your personal goals for exercise, nutrition, and risk factors. Our goal is to help you stay on track towards obtaining and maintaining these goals. We will be discussing your progress on these goals with you throughout the program.  Initial Exercise Prescription: Initial Exercise Prescription - 06/11/18 1400      Date of Initial Exercise RX and Referring Provider   Date  06/11/18    Referring Provider  Kathlyn Sacramento MD      Treadmill   MPH  2.2    Grade  0.5    Minutes  15    METs  2.84      NuStep   Level  4    SPM  80    Minutes  15    METs  2.2      REL-XR   Level  2    Speed  50    Minutes  15    METs  2.2      Prescription Details   Frequency (times per week)  3    Duration  Progress to 45 minutes of aerobic exercise without signs/symptoms of physical distress      Intensity   THRR 40-80% of Max Heartrate  96-125    Ratings of Perceived Exertion  11-13    Perceived Dyspnea  0-4      Progression   Progression  Continue to progress workloads to maintain intensity without signs/symptoms of physical distress.      Resistance Training   Training Prescription  Yes    Weight  3 lbs    Reps  10-15       Exercise Goals: Frequency: Be able to perform aerobic exercise two to three times per week in program working toward 2-5 days per week of home exercise.  Intensity: Work with a perceived exertion of 11 (fairly light) - 15 (hard) while following your exercise prescription.  We will make changes to your prescription with you as you progress through the program.   Duration: Be able to do 30 to 45 minutes of continuous aerobic exercise in addition to a 5 minute warm-up and a 5 minute cool-down routine.   Nutrition Goals: Your personal nutrition goals will be established when you do your nutrition analysis with  the dietician.  The following are general nutrition guidelines to follow: Cholesterol < 200mg /day Sodium < 1500mg /day Fiber: Women over 50 yrs - 21 grams per day  Personal Goals: Personal Goals and Risk Factors at Admission - 06/11/18 1255      Core Components/Risk Factors/Patient Goals on Admission    Weight Management  Yes;Weight Maintenance   Feels worse when weight gets too low   Intervention  Weight Management: Develop a combined nutrition and exercise program designed to reach desired caloric intake, while maintaining appropriate intake of nutrient and fiber, sodium and fats, and appropriate energy expenditure required for the weight goal.;Weight Management: Provide education and appropriate resources to help participant work on and attain dietary goals.    Admit Weight  108 lb 8 oz (49.2 kg)    Goal Weight: Short Term  108 lb (49 kg)    Goal Weight: Long Term  108 lb (49 kg)    Expected Outcomes  Short Term: Continue to assess and modify interventions until short term weight is achieved;Long Term: Adherence to nutrition and physical activity/exercise program aimed  toward attainment of established weight goal;Weight Maintenance: Understanding of the daily nutrition guidelines, which includes 25-35% calories from fat, 7% or less cal from saturated fats, less than 200mg  cholesterol, less than 1.5gm of sodium, & 5 or more servings of fruits and vegetables daily    Improve shortness of breath with ADL's  Yes    Intervention  Provide education, individualized exercise plan and daily activity instruction to help decrease symptoms of SOB with activities of daily living.    Expected Outcomes  Short Term: Improve cardiorespiratory fitness to achieve a reduction of symptoms when performing ADLs;Long Term: Be able to perform more ADLs without symptoms or delay the onset of symptoms    Heart Failure  Yes    Intervention  Provide a combined exercise and nutrition program that is supplemented with  education, support and counseling about heart failure. Directed toward relieving symptoms such as shortness of breath, decreased exercise tolerance, and extremity edema.    Expected Outcomes  Improve functional capacity of life;Short term: Attendance in program 2-3 days a week with increased exercise capacity. Reported lower sodium intake. Reported increased fruit and vegetable intake. Reports medication compliance.;Short term: Daily weights obtained and reported for increase. Utilizing diuretic protocols set by physician.;Long term: Adoption of self-care skills and reduction of barriers for early signs and symptoms recognition and intervention leading to self-care maintenance.    Hypertension  Yes    Intervention  Provide education on lifestyle modifcations including regular physical activity/exercise, weight management, moderate sodium restriction and increased consumption of fresh fruit, vegetables, and low fat dairy, alcohol moderation, and smoking cessation.;Monitor prescription use compliance.    Expected Outcomes  Short Term: Continued assessment and intervention until BP is < 140/69mm HG in hypertensive participants. < 130/58mm HG in hypertensive participants with diabetes, heart failure or chronic kidney disease.;Long Term: Maintenance of blood pressure at goal levels.    Lipids  Yes    Intervention  Provide education and support for participant on nutrition & aerobic/resistive exercise along with prescribed medications to achieve LDL 70mg , HDL >40mg .    Expected Outcomes  Short Term: Participant states understanding of desired cholesterol values and is compliant with medications prescribed. Participant is following exercise prescription and nutrition guidelines.;Long Term: Cholesterol controlled with medications as prescribed, with individualized exercise RX and with personalized nutrition plan. Value goals: LDL < 70mg , HDL > 40 mg.       Tobacco Use Initial Evaluation: Social History    Tobacco Use  Smoking Status Former Smoker  . Types: Cigarettes  . Last attempt to quit: 1950  . Years since quitting: 69.8  Smokeless Tobacco Never Used    Exercise Goals and Review: Exercise Goals    Row Name 06/11/18 1429             Exercise Goals   Increase Physical Activity  Yes       Intervention  Provide advice, education, support and counseling about physical activity/exercise needs.;Develop an individualized exercise prescription for aerobic and resistive training based on initial evaluation findings, risk stratification, comorbidities and participant's personal goals.       Expected Outcomes  Short Term: Attend rehab on a regular basis to increase amount of physical activity.;Long Term: Add in home exercise to make exercise part of routine and to increase amount of physical activity.;Long Term: Exercising regularly at least 3-5 days a week.       Increase Strength and Stamina  Yes       Intervention  Provide advice, education, support  and counseling about physical activity/exercise needs.;Develop an individualized exercise prescription for aerobic and resistive training based on initial evaluation findings, risk stratification, comorbidities and participant's personal goals.       Expected Outcomes  Short Term: Increase workloads from initial exercise prescription for resistance, speed, and METs.;Short Term: Perform resistance training exercises routinely during rehab and add in resistance training at home;Long Term: Improve cardiorespiratory fitness, muscular endurance and strength as measured by increased METs and functional capacity (6MWT)       Able to understand and use rate of perceived exertion (RPE) scale  Yes       Intervention  Provide education and explanation on how to use RPE scale       Expected Outcomes  Short Term: Able to use RPE daily in rehab to express subjective intensity level;Long Term:  Able to use RPE to guide intensity level when exercising  independently       Able to understand and use Dyspnea scale  Yes       Intervention  Provide education and explanation on how to use Dyspnea scale       Expected Outcomes  Short Term: Able to use Dyspnea scale daily in rehab to express subjective sense of shortness of breath during exertion;Long Term: Able to use Dyspnea scale to guide intensity level when exercising independently       Knowledge and understanding of Target Heart Rate Range (THRR)  Yes       Intervention  Provide education and explanation of THRR including how the numbers were predicted and where they are located for reference       Expected Outcomes  Short Term: Able to state/look up THRR;Short Term: Able to use daily as guideline for intensity in rehab;Long Term: Able to use THRR to govern intensity when exercising independently       Able to check pulse independently  Yes       Intervention  Provide education and demonstration on how to check pulse in carotid and radial arteries.;Review the importance of being able to check your own pulse for safety during independent exercise       Expected Outcomes  Short Term: Able to explain why pulse checking is important during independent exercise;Long Term: Able to check pulse independently and accurately       Understanding of Exercise Prescription  Yes       Intervention  Provide education, explanation, and written materials on patient's individual exercise prescription       Expected Outcomes  Short Term: Able to explain program exercise prescription;Long Term: Able to explain home exercise prescription to exercise independently          Copy of goals given to participant.

## 2018-06-11 NOTE — Progress Notes (Signed)
Daily Session Note  Patient Details  Name: Jeanne Pittman MRN: [id number] Date of Birth: 06-Apr-1935 Referring Provider:     Pulmonary Rehab from 06/11/2018 in [facility] Cardiac and Pulmonary Rehab  Referring Provider  Kathlyn Sacramento MD      Encounter Date: 06/11/2018  Check In: Session Check In - 06/11/18 1235      Check-In   Supervising physician immediately available to respond to emergencies  [facility] immediately available ER MD    Physician(s)  Dr. Joni Fears and Reita Cliche    Location  [name]-Cardiac & Pulmonary Rehab    Staff Present  Justin Mend RCP,RRT,BSRT;Jessica Luan Pulling, [place], RCEP, CCRP, Exercise Physiologist    Medication changes reported      No    Fall or balance concerns reported     No    Warm-up and Cool-down  Not performed (comment)   medical evaluation   Resistance Training Performed  No    VAD Patient?  No    PAD/SET Patient?  No      Pain Assessment   Currently in Pain?  No/denies        Exercise Prescription Changes - 06/11/18 1400      Response to Exercise   Blood Pressure (Admit)  122/70    Blood Pressure (Exercise)  124/66    Blood Pressure (Exit)  128/72    Heart Rate (Admit)  68 bpm    Heart Rate (Exercise)  86 bpm    Heart Rate (Exit)  75 bpm    Oxygen Saturation (Admit)  99 %    Oxygen Saturation (Exercise)  94 %    Oxygen Saturation (Exit)  98 %    Rating of Perceived Exertion (Exercise)  11    Perceived Dyspnea (Exercise)  1    Symptoms  none    Comments  walk test results       Social History   Tobacco Use  Smoking Status Former Smoker  . Types: Cigarettes  . Last attempt to quit: 1950  . Years since quitting: 69.8  Smokeless Tobacco Never Used    Goals Met:  Exercise tolerated well Queuing for purse lip breathing No report of cardiac concerns or symptoms Strength training completed today  Goals Unmet:  Not Applicable  Comments: Medical Evaluation completed during CARE graduation.  Service time 1215-1300   Dr. Emily Filbert  is Medical Director for [place] and [facility].

## 2018-06-14 ENCOUNTER — Encounter: Payer: Medicare Other | Admitting: *Deleted

## 2018-06-14 DIAGNOSIS — I5022 Chronic systolic (congestive) heart failure: Secondary | ICD-10-CM | POA: Diagnosis not present

## 2018-06-14 NOTE — Progress Notes (Signed)
Daily Session Note  Patient Details  Name: Jeanne Pittman MRN: [id number] Date of Birth: 1935-05-31 Referring Provider:     Pulmonary Rehab from 06/11/2018 in [facility] Cardiac and Pulmonary Rehab  Referring Provider  Jeanne Sacramento MD      Encounter Date: 06/14/2018  Check In: Session Check In - 06/14/18 1122      Check-In   Supervising physician immediately available to respond to emergencies  [facility] immediately available ER MD    Physician(s)  Drs. Siadecki and Optician, dispensing & Pulmonary Rehab    Staff Present  Renita Papa, RN BSN;Carlo Lorson Luan Pulling, MA, RCEP, CCRP, Exercise Physiologist;Joseph Tessie Fass RCP,RRT,BSRT    Medication changes reported      No    Fall or balance concerns reported     No    Warm-up and Cool-down  Performed as group-led instruction    Resistance Training Performed  Yes    VAD Patient?  No    PAD/SET Patient?  No      Pain Assessment   Currently in Pain?  No/denies          Social History   Tobacco Use  Smoking Status Former Smoker  . Types: Cigarettes  . Last attempt to quit: 1950  . Years since quitting: 69.8  Smokeless Tobacco Never Used    Goals Met:  No report of cardiac concerns or symptoms  Goals Unmet:  Not Applicable  Comments:  Jeanne Pittman did not feel well and left after education. She reported receiving the flu shot yesterday and attributes her symptoms to that. She plans on returning Monday for her first full day of exercise.    Dr. Emily Pittman is Medical Director for [place] and [facility].

## 2018-06-17 DIAGNOSIS — I5022 Chronic systolic (congestive) heart failure: Secondary | ICD-10-CM | POA: Diagnosis not present

## 2018-06-17 NOTE — Progress Notes (Signed)
Daily Session Note  Patient Details  Name: Jeanne Pittman MRN: [id number] Date of Birth: 04/09/1935 Referring Provider:     Pulmonary Rehab from 06/11/2018 in [facility] Cardiac and Pulmonary Rehab  Referring Provider  Kathlyn Sacramento MD      Encounter Date: 06/17/2018  Check In:      Social History   Tobacco Use  Smoking Status Former Smoker  . Types: Cigarettes  . Last attempt to quit: 1950  . Years since quitting: 69.8  Smokeless Tobacco Never Used    Goals Met:  Proper associated with RPD/PD & O2 Sat Independence with exercise equipment Exercise tolerated well Strength training completed today  Goals Unmet:  Not Applicable  Comments: Pt able to follow exercise prescription today without complaint.  Will continue to monitor for progression.    Dr. Emily Filbert is Medical Director for [place] and [facility].

## 2018-06-19 DIAGNOSIS — I5022 Chronic systolic (congestive) heart failure: Secondary | ICD-10-CM

## 2018-06-19 NOTE — Progress Notes (Signed)
Daily Session Note  Patient Details  Name: Jeanne Pittman MRN: [id number] Date of Birth: 24-Sep-1934 Referring Provider:     Pulmonary Rehab from 06/11/2018 in [facility] Cardiac and Pulmonary Rehab  Referring Provider  Kathlyn Sacramento MD      Encounter Date: 06/19/2018  Check In: Session Check In - 06/19/18 1141      Check-In   Supervising physician immediately available to respond to emergencies  [facility] immediately available ER MD    Physician(s)  Dr. Reita Cliche and Quentin Cornwall    Location  [name]-Cardiac & Pulmonary Rehab    Staff Present  Justin Mend Lorre Nick, MA, RCEP, CCRP, Exercise Physiologist;Meredith Sherryll Burger, RN BSN    Medication changes reported      No    Fall or balance concerns reported     No    Warm-up and Cool-down  Performed as group-led Higher education careers adviser Performed  Yes    VAD Patient?  No    PAD/SET Patient?  No      Pain Assessment   Currently in Pain?  No/denies          Social History   Tobacco Use  Smoking Status Former Smoker  . Types: Cigarettes  . Last attempt to quit: 1950  . Years since quitting: 69.8  Smokeless Tobacco Never Used    Goals Met:  Exercise tolerated well No report of cardiac concerns or symptoms Strength training completed today  Goals Unmet:  Not Applicable  Comments: Pt able to follow exercise prescription today without complaint.  Will continue to monitor for progression.    Dr. Emily Filbert is Medical Director for [place] and [facility].

## 2018-06-21 ENCOUNTER — Encounter: Payer: Medicare Other | Attending: Cardiovascular Disease

## 2018-06-21 DIAGNOSIS — R0602 Shortness of breath: Secondary | ICD-10-CM | POA: Insufficient documentation

## 2018-06-21 DIAGNOSIS — I5022 Chronic systolic (congestive) heart failure: Secondary | ICD-10-CM | POA: Insufficient documentation

## 2018-06-24 DIAGNOSIS — R0602 Shortness of breath: Secondary | ICD-10-CM | POA: Diagnosis not present

## 2018-06-24 DIAGNOSIS — I5022 Chronic systolic (congestive) heart failure: Secondary | ICD-10-CM | POA: Diagnosis not present

## 2018-06-24 NOTE — Progress Notes (Signed)
Daily Session Note  Patient Details  Name: Jeanne Pittman MRN: [id number] Date of Birth: [birth year] Referring Provider:     Pulmonary Rehab from 06/11/2018 in [facility] Cardiac and Pulmonary Rehab  Referring Provider  Kathlyn Sacramento MD      Encounter Date: 06/24/2018  Check In: Session Check In - 06/24/18 1350      Check-In   Supervising physician immediately available to respond to emergencies  [facility] immediately available ER MD    Physician(s)  Dr. Corky Downs and [facility]    Location  [name]-Cardiac & Pulmonary Rehab    Staff Present  Justin Mend RCP,RRT,BSRT;Kelly Amedeo Plenty, BS, ACSM CEP, Exercise Physiologist    Medication changes reported      No    Fall or balance concerns reported     No    Warm-up and Cool-down  Performed as group-led instruction    Resistance Training Performed  Yes    VAD Patient?  No    PAD/SET Patient?  No      Pain Assessment   Currently in Pain?  No/denies          Social History   Tobacco Use  Smoking Status Former Smoker  . Types: Cigarettes  . Last attempt to quit: 1950  . Years since quitting: 69.8  Smokeless Tobacco Never Used    Goals Met:  Independence with exercise equipment Exercise tolerated well No report of cardiac concerns or symptoms Strength training completed today  Goals Unmet:  Not Applicable  Comments: Pt able to follow exercise prescription today without complaint.  Will continue to monitor for progression.    Dr. Emily Filbert is Medical Director for [place] and [facility].

## 2018-06-25 NOTE — Progress Notes (Signed)
First full day of exercise!  Patient was oriented to gym and equipment including functions, settings, policies, and procedures.  Patient's individual exercise prescription and treatment plan were reviewed.  All starting workloads were established based on the results of the 6 minute walk test done at initial orientation visit.  The plan for exercise progression was also introduced and progression will be customized based on patient's performance and goals.  ?

## 2018-06-26 ENCOUNTER — Encounter: Payer: Medicare Other | Admitting: *Deleted

## 2018-06-26 DIAGNOSIS — I5022 Chronic systolic (congestive) heart failure: Secondary | ICD-10-CM | POA: Diagnosis not present

## 2018-06-26 NOTE — Progress Notes (Signed)
Daily Session Note  Patient Details  Name: Jeanne Pittman MRN: [id number] Date of Birth: 03/22/1935 Referring Provider:     Pulmonary Rehab from 06/11/2018 in [name] Cardiac and Pulmonary Rehab  Referring Provider  Arida, Muhammad MD      Encounter Date: 06/26/2018  Check In: Session Check In - 06/26/18 1126      Check-In   Supervising physician immediately available to respond to emergencies  [facility] immediately available ER MD    Physician(s)  Drs. Norman and Veronese    Location  [name]-Cardiac & Pulmonary Rehab    Staff Present  Jessica Hawkins, MA, RCEP, CCRP, Exercise Physiologist;Joseph Hood RCP,RRT,BSRT;Meredith Craven, RN BSN    Medication changes reported      No    Fall or balance concerns reported     No    Warm-up and Cool-down  Performed as group-led instruction    Resistance Training Performed  Yes    VAD Patient?  No    PAD/SET Patient?  No      Pain Assessment   Currently in Pain?  No/denies          Social History   Tobacco Use  Smoking Status Former Smoker  . Types: Cigarettes  . Last attempt to quit: 1950  . Years since quitting: 69.8  Smokeless Tobacco Never Used    Goals Met:  Proper associated with RPD/PD & O2 Sat Independence with exercise equipment Using PLB without cueing & demonstrates good technique Exercise tolerated well No report of cardiac concerns or symptoms Strength training completed today  Goals Unmet:  Not Applicable  Comments: Pt able to follow exercise prescription today without complaint.  Will continue to monitor for progression. Reviewed home exercise with pt today.  Pt plans to walk at home for exercise.  Reviewed THR, pulse, RPE, sign and symptoms, and when to call 911 or MD.  Also discussed weather considerations and indoor options.  Pt voiced understanding.    Dr. Mark Miller is Medical Director for [facility] and [facility]. 

## 2018-06-28 ENCOUNTER — Encounter: Payer: Medicare Other | Admitting: *Deleted

## 2018-06-28 DIAGNOSIS — I5022 Chronic systolic (congestive) heart failure: Secondary | ICD-10-CM

## 2018-06-28 NOTE — Progress Notes (Signed)
Daily Session Note  Patient Details  Name: Jeanne Pittman MRN: [id number] Date of Birth: 12-30-1934 Referring Provider:     Pulmonary Rehab from 06/11/2018 in [facility] Cardiac and Pulmonary Rehab  Referring Provider  Kathlyn Sacramento MD      Encounter Date: 06/28/2018  Check In: Session Check In - 06/28/18 1125      Check-In   Supervising physician immediately available to respond to emergencies  [facility] immediately available ER MD    Physician(s)  Drs. Cinda Quest and Optician, dispensing & Pulmonary Rehab    Staff Present  Renita Papa, RN BSN;Precious Segall Luan Pulling, [place], RCEP, CCRP, Exercise Physiologist;Joseph Tessie Fass RCP,RRT,BSRT    Medication changes reported      No    Fall or balance concerns reported     No    Warm-up and Cool-down  Performed as group-led instruction    Resistance Training Performed  Yes    VAD Patient?  No    PAD/SET Patient?  No      Pain Assessment   Currently in Pain?  No/denies          Social History   Tobacco Use  Smoking Status Former Smoker  . Types: Cigarettes  . Last attempt to quit: 1950  . Years since quitting: 69.8  Smokeless Tobacco Never Used    Goals Met:  Proper associated with RPD/PD & O2 Sat Independence with exercise equipment Using PLB without cueing & demonstrates good technique Exercise tolerated well Personal goals reviewed No report of cardiac concerns or symptoms Strength training completed today  Goals Unmet:  Not Applicable  Comments: Pt able to follow exercise prescription today without complaint.  Will continue to monitor for progression.    Dr. Emily Filbert is Medical Director for [place] and [facility]monary [facility].

## 2018-06-30 ENCOUNTER — Other Ambulatory Visit: Payer: Self-pay | Admitting: Cardiovascular Disease

## 2018-07-01 DIAGNOSIS — I5022 Chronic systolic (congestive) heart failure: Secondary | ICD-10-CM | POA: Diagnosis not present

## 2018-07-01 NOTE — Progress Notes (Signed)
Daily Session Note  Patient Details  Name: DELONDA COLEY MRN: [id number] Date of Birth: Dec 24, 1934 Referring Provider:     Pulmonary Rehab from 06/11/2018 in [facility] Cardiac and Pulmonary Rehab  Referring Provider  Kathlyn Sacramento MD      Encounter Date: 07/01/2018  Check In: Session Check In - 07/01/18 1208      Check-In   Supervising physician immediately available to respond to emergencies  [facility] immediately available ER MD    Physician(s)   Dr. Jacqualine Code and [facility]    Location  [name]-Cardiac & Pulmonary Rehab    Staff Present  Justin Mend Jaci Carrel, BS, ACSM CEP, Exercise Physiologist;Laureen Owens Shark, BS, RRT, Respiratory Therapist    Medication changes reported      No    Fall or balance concerns reported     No    Warm-up and Cool-down  Performed as group-led instruction    Resistance Training Performed  Yes    VAD Patient?  No    PAD/SET Patient?  No      Pain Assessment   Currently in Pain?  No/denies          Social History   Tobacco Use  Smoking Status Former Smoker  . Types: Cigarettes  . Last attempt to quit: 1950  . Years since quitting: 69.9  Smokeless Tobacco Never Used    Goals Met:  Independence with exercise equipment Exercise tolerated well No report of cardiac concerns or symptoms Strength training completed today  Goals Unmet:  Not Applicable  Comments: Pt able to follow exercise prescription today without complaint.  Will continue to monitor for progression.    Dr. Emily Filbert is Medical Director for [place] and [facility].

## 2018-07-03 ENCOUNTER — Encounter: Payer: Medicare Other | Admitting: *Deleted

## 2018-07-03 DIAGNOSIS — I5022 Chronic systolic (congestive) heart failure: Secondary | ICD-10-CM

## 2018-07-03 NOTE — Progress Notes (Signed)
Daily Session Note  Patient Details  Name: Jeanne Pittman MRN: [id number] Date of Birth: 09-24-1934 Referring Provider:     Pulmonary Rehab from 06/11/2018 in [facility] Cardiac and Pulmonary Rehab  Referring Provider  Kathlyn Sacramento MD      Encounter Date: 07/03/2018  Check In: Session Check In - 07/03/18 1140      Check-In   Supervising physician immediately available to respond to emergencies  [facility] immediately available ER MD    Physician(s)  Dr. Reita Cliche and Darl Householder    Location  [name]-Cardiac & Pulmonary Rehab    Staff Present  Renita Papa, RN BSN;Jessica Luan Pulling, MA, RCEP, CCRP, Exercise Physiologist;Joseph Tessie Fass RCP,RRT,BSRT    Medication changes reported      No    Fall or balance concerns reported     No    Warm-up and Cool-down  Performed as group-led instruction    Resistance Training Performed  Yes    VAD Patient?  No    PAD/SET Patient?  No      Pain Assessment   Currently in Pain?  No/denies          Social History   Tobacco Use  Smoking Status Former Smoker  . Types: Cigarettes  . Last attempt to quit: 1950  . Years since quitting: 69.9  Smokeless Tobacco Never Used    Goals Met:  Proper associated with RPD/PD & O2 Sat Independence with exercise equipment Using PLB without cueing & demonstrates good technique Exercise tolerated well No report of cardiac concerns or symptoms Strength training completed today  Goals Unmet:  Not Applicable  Comments: Pt able to follow exercise prescription today without complaint.  Will continue to monitor for progression.    Dr. Emily Filbert is Medical Director for [place] and [facility].

## 2018-07-08 DIAGNOSIS — I5022 Chronic systolic (congestive) heart failure: Secondary | ICD-10-CM | POA: Diagnosis not present

## 2018-07-08 NOTE — Progress Notes (Signed)
Daily Session Note  Patient Details  Name: Jeanne Pittman MRN: [id number] Date of Birth: 03-05-1935 Referring Provider:     Pulmonary Rehab from 06/11/2018 in [facility] Cardiac and Pulmonary Rehab  Referring Provider  Kathlyn Sacramento MD      Encounter Date: 07/08/2018  Check In:      Social History   Tobacco Use  Smoking Status Former Smoker  . Types: Cigarettes  . Last attempt to quit: 1950  . Years since quitting: 69.9  Smokeless Tobacco Never Used    Goals Met:  Proper associated with RPD/PD & O2 Sat Independence with exercise equipment Exercise tolerated well Strength training completed today  Goals Unmet:  Not Applicable  Comments: Pt able to follow exercise prescription today without complaint.  Will continue to monitor for progression.    Dr. Emily Filbert is Medical Director for [place] and [facility].

## 2018-07-08 NOTE — Progress Notes (Signed)
Pulmonary Individual Treatment Plan  Patient Details  Name: Jeanne Pittman MRN: [id number] Date of Birth: 09/22/1934 Referring Provider:     Pulmonary Rehab from 06/11/2018 in [facility] Cardiac and Pulmonary Rehab  Referring Provider  Kathlyn Sacramento MD      Initial Encounter Date:    Pulmonary Rehab from 06/11/2018 in [facility] Cardiac and Pulmonary Rehab  Date  06/11/18      Visit Diagnosis: Chronic systolic heart failure ([place])  Patient's Home Medications on Admission:  Current Outpatient Medications:  .  acetaminophen (TYLENOL) 650 MG CR tablet, Take 650 mg by mouth every 8 (eight) hours as needed for pain., Disp: , Rfl:  .  aspirin 81 MG tablet, Take 81 mg by mouth every morning. , Disp: , Rfl:  .  carvedilol (COREG) 3.125 MG tablet, TAKE 1 TABLET BY MOUTH TWICE DAILY WITH A MEAL, Disp: 60 tablet, Rfl: 6 .  Cholecalciferol (VITAMIN D-3) 5000 UNITS TABS, Take 2,000 Int'l Units by mouth every morning. , Disp: , Rfl:  .  fexofenadine (ALLEGRA) 180 MG tablet, Take 180 mg by mouth daily as needed for rhinitis. , Disp: , Rfl:  .  Fluocinolone Acetonide Scalp 0.01 % OIL, as needed., Disp: , Rfl:  .  furosemide (LASIX) 20 MG tablet, Take 1 tablet (20 mg total) by mouth daily. (Patient taking differently: Take 20 mg by mouth daily as needed. ), Disp: 30 tablet, Rfl: 6 .  gabapentin (NEURONTIN) 300 MG capsule, Take 300 mg by mouth as needed., Disp: , Rfl:  .  latanoprost (XALATAN) 0.005 % ophthalmic solution, Place 1 drop into both eyes at bedtime. , Disp: , Rfl:  .  LORazepam (ATIVAN) 0.5 MG tablet, Take 0.5 mg by mouth every 8 (eight) hours as needed for anxiety. , Disp: , Rfl:  .  losartan (COZAAR) 25 MG tablet, TAKE 1/2 TABLET(12.5 MG) BY MOUTH DAILY AFTER BREAKFAST (Patient taking differently: Take 25 mg by mouth daily. ), Disp: 15 tablet, Rfl: 8 .  mupirocin ointment (BACTROBAN) 2 %, Apply topically as needed., Disp: , Rfl:  .  oxybutynin (DITROPAN-XL) 10 MG 24 hr tablet, Take 1 tablet (10 mg  total) by mouth daily. (Patient not taking: Reported on 06/11/2018), Disp: 30 tablet, Rfl: 3 .  pantoprazole (PROTONIX) 40 MG tablet, TAKE 1 TABLET(40 MG) BY MOUTH TWICE DAILY, Disp: 60 tablet, Rfl: 3 .  rosuvastatin (CRESTOR) 10 MG tablet, TAKE 1 TABLET(10 MG) BY MOUTH EVERY OTHER DAY, Disp: 45 tablet, Rfl: 2 .  timolol (TIMOPTIC) 0.5 % ophthalmic solution, Place 1 drop into both eyes 2 (two) times daily. , Disp: , Rfl:  .  triamcinolone ointment (KENALOG) 0.1 %, as needed., Disp: , Rfl:   Past Medical History: Past Medical History:  Diagnosis Date  . A-fib ([place])    07/2012: A. fib with RVR in the setting of myocardial infarction. Converted to sinus rhythm with amiodarone. No recurrence.  . Anxiety   . Atrophic vaginitis   . Chronic combined systolic (congestive) and diastolic (congestive) heart failure ([place])    a. EF was 25-35% post MI but improved to 45-50% in 04/2013; b. 03/2017 Echo: EF 40-45%, mod focal/basal hypertrophy of septum. Sev mid-apicalanteroseptal, ant, and apical HK. Gr1 DD, mild AI/MR, mod TR, PASP 20mg.  . Colon adenomas   . Coronary artery disease    a. 100/8676STEMI complicated by cardiogenic shock. Cath/PCI: LAD [id number]), RCA 95p(DES). EF 25%; b. 03/2017 MV: EF 57%, fixed apical, periapical, mid to distal anteroseptal defect. No ischemia.  .[name]  Dysrhythmia    A-fib  . Fibrocystic breast disease   . GERD (gastroesophageal reflux disease)   . Glaucoma   . History of gastritis   . Hyperlipidemia   . Hypertension   . Insomnia   . Ischemic cardiomyopathy    a. 07/2012 EF 25-35% following MI-->Improved to 45-50%;  b. 03/2017 Echo: EF 40-45%.  . Myocardial infarction ([place]) 08/18/12  . Non Hodgkin's lymphoma ([place]) 2005   reoccurance 2007 and 2012  . Osteoarthritis   . Polyneuropathy   . Polyposis of colon   . Restless leg syndrome   . Urinary, incontinence, stress female     Tobacco Use: Social History   Tobacco Use  Smoking Status Former Smoker  . Types:  Cigarettes  . Last attempt to quit: 1950  . Years since quitting: 69.9  Smokeless Tobacco Never Used    Labs: Recent Chemical engineer    Labs for ITP Cardiac and Pulmonary Rehab Latest Ref Rng & Units 04/07/2013 09/02/2015   Cholestrol 100 - 199 mg/dL 136 172   LDLCALC 0 - 99 mg/dL 58 99   HDL >39 mg/dL 54 51   Trlycerides 0 - 149 mg/dL 120 111       Pulmonary Assessment Scores: Pulmonary Assessment Scores    Row Name 06/11/18 1238         ADL [facility]   ADL Phase  Entry     SOB Score total  75     Rest  1     Walk  2     Stairs  3     Bath  3     Dress  3     Shop  4       CAT Score   CAT Score  22       mMRC Score   mMRC Score  2        Pulmonary Function Assessment:   Exercise Target Goals: Exercise Program Goal: Individual exercise prescription set using results from initial 6 min walk test and THRR while considering  patient's activity barriers and safety.   Exercise Prescription Goal: Initial exercise prescription builds to 30-45 minutes a day of aerobic activity, 2-3 days per week.  Home exercise guidelines will be given to patient during program as part of exercise prescription that the participant will acknowledge.  Activity Barriers & Risk Stratification: Activity Barriers & Cardiac Risk Stratification - 06/11/18 1320      Activity Barriers & Cardiac Risk Stratification   Activity Barriers  Decreased Ventricular Function;Deconditioning;Muscular Weakness;Shortness of Breath       6 Minute Walk: 6 Minute Walk    Row Name 06/11/18 1314         6 Minute Walk   Phase  Initial     Distance  1170 feet     Walk Time  6 minutes     # of Rest Breaks  0     MPH  2.22     METS  2.04     RPE  11     Perceived Dyspnea   1     VO2 Peak  7.15     Symptoms  No     Resting HR  68 bpm     Resting BP  122/70     Resting Oxygen Saturation   99 %     Exercise Oxygen Saturation  during 6 min walk  94 %     Max Ex. HR  86 bpm  Max Ex. BP  124/66      2 Minute Post BP  128/72       Interval HR   1 Minute HR  76     2 Minute HR  83     3 Minute HR  82     4 Minute HR  84     5 Minute HR  84     6 Minute HR  86     2 Minute Post HR  75     Interval Heart Rate?  Yes       Interval Oxygen   Interval Oxygen?  Yes     Baseline Oxygen Saturation %  99 %     1 Minute Oxygen Saturation %  95 %     1 Minute Liters of Oxygen  0 L Room Air     2 Minute Oxygen Saturation %  95 %     2 Minute Liters of Oxygen  0 L     3 Minute Oxygen Saturation %  94 %     3 Minute Liters of Oxygen  0 L     4 Minute Oxygen Saturation %  95 %     4 Minute Liters of Oxygen  0 L     5 Minute Oxygen Saturation %  94 %     5 Minute Liters of Oxygen  0 L     6 Minute Oxygen Saturation %  95 %     6 Minute Liters of Oxygen  0 L     2 Minute Post Oxygen Saturation %  98 %     2 Minute Post Liters of Oxygen  0 L       Oxygen Initial Assessment: Oxygen Initial Assessment - 06/11/18 1250      Home Oxygen   Home Oxygen Device  None    Sleep Oxygen Prescription  None    Home Exercise Oxygen Prescription  None    Home at Rest Exercise Oxygen Prescription  None      Initial 6 min Walk   Oxygen Used  None      Program Oxygen Prescription   Program Oxygen Prescription  None      Intervention   Short Term Goals  To learn and understand importance of monitoring SPO2 with pulse oximeter and demonstrate accurate use of the pulse oximeter.;To learn and understand importance of maintaining oxygen saturations>88%;To learn and demonstrate proper pursed lip breathing techniques or other breathing techniques.    Long  Term Goals  Verbalizes importance of monitoring SPO2 with pulse oximeter and return demonstration;Maintenance of O2 saturations>88%;Exhibits proper breathing techniques, such as pursed lip breathing or other method taught during program session       Oxygen Re-Evaluation: Oxygen Re-Evaluation    Row Name 06/14/18 1127 06/17/18 1628 06/28/18 1205          Program Oxygen Prescription   Program Oxygen Prescription  -  -  None       Home Oxygen   Home Oxygen Device  -  -  None     Sleep Oxygen Prescription  -  -  None     Home Exercise Oxygen Prescription  -  -  None     Home at Rest Exercise Oxygen Prescription  -  -  None       Goals/Expected Outcomes   Short Term Goals  -  To learn and understand importance of monitoring SPO2 with pulse oximeter  and demonstrate accurate use of the pulse oximeter.;To learn and understand importance of maintaining oxygen saturations>88%;To learn and demonstrate proper pursed lip breathing techniques or other breathing techniques.  To learn and understand importance of monitoring SPO2 with pulse oximeter and demonstrate accurate use of the pulse oximeter.;To learn and understand importance of maintaining oxygen saturations>88%;To learn and demonstrate proper pursed lip breathing techniques or other breathing techniques.     Long  Term Goals  -  Verbalizes importance of monitoring SPO2 with pulse oximeter and return demonstration;Maintenance of O2 saturations>88%;Exhibits proper breathing techniques, such as pursed lip breathing or other method taught during program session  Verbalizes importance of monitoring SPO2 with pulse oximeter and return demonstration;Maintenance of O2 saturations>88%;Exhibits proper breathing techniques, such as pursed lip breathing or other method taught during program session     Comments  -  Reviewed PLB technique with pt.  Talked about how it work and it's important to maintaining his exercise saturations.    Manjit is off to a good start in rehab.  She is doing better with her breathing.  She has not gotten a pulse oximeter yet, but is still planning to.   She has been doing well with her PLB and finds it helpful when she is SOB.  She will sit down and use her PLB to recover.       Goals/Expected Outcomes  -  Short: Become more profiecient at using PLB.   Long: Become independent at  using PLB.  Short: Get pulse oximeter  Long: Continue to work on using PLB.         Oxygen Discharge (Final Oxygen Re-Evaluation): Oxygen Re-Evaluation - 06/28/18 1205      Program Oxygen Prescription   Program Oxygen Prescription  None      Home Oxygen   Home Oxygen Device  None    Sleep Oxygen Prescription  None    Home Exercise Oxygen Prescription  None    Home at Rest Exercise Oxygen Prescription  None      Goals/Expected Outcomes   Short Term Goals  To learn and understand importance of monitoring SPO2 with pulse oximeter and demonstrate accurate use of the pulse oximeter.;To learn and understand importance of maintaining oxygen saturations>88%;To learn and demonstrate proper pursed lip breathing techniques or other breathing techniques.    Long  Term Goals  Verbalizes importance of monitoring SPO2 with pulse oximeter and return demonstration;Maintenance of O2 saturations>88%;Exhibits proper breathing techniques, such as pursed lip breathing or other method taught during program session    Comments  Doral is off to a good start in rehab.  She is doing better with her breathing.  She has not gotten a pulse oximeter yet, but is still planning to.   She has been doing well with her PLB and finds it helpful when she is SOB.  She will sit down and use her PLB to recover.      Goals/Expected Outcomes  Short: Get pulse oximeter  Long: Continue to work on using PLB.        Initial Exercise Prescription: Initial Exercise Prescription - 06/11/18 1400      Date of Initial Exercise RX and Referring Provider   Date  06/11/18    Referring Provider  Kathlyn Sacramento MD      Treadmill   MPH  2.2    Grade  0.5    Minutes  15    METs  2.84      NuStep   Level  4  SPM  80    Minutes  15    METs  2.2      REL-XR   Level  2    Speed  50    Minutes  15    METs  2.2      Prescription Details   Frequency (times per week)  3    Duration  Progress to 45 minutes of aerobic exercise  without signs/symptoms of physical distress      Intensity   THRR 40-80% of Max Heartrate  96-125    Ratings of Perceived Exertion  11-13    Perceived Dyspnea  0-4      Progression   Progression  Continue to progress workloads to maintain intensity without signs/symptoms of physical distress.      Resistance Training   Training Prescription  Yes    Weight  3 lbs    Reps  10-15       Perform Capillary Blood Glucose checks as needed.  Exercise Prescription Changes: Exercise Prescription Changes    Row Name 06/11/18 1400 06/25/18 1600 06/26/18 1200         Response to Exercise   Blood Pressure (Admit)  122/70  124/60  -     Blood Pressure (Exercise)  124/66  134/64  -     Blood Pressure (Exit)  128/72  110/62  -     Heart Rate (Admit)  68 bpm  109 bpm  -     Heart Rate (Exercise)  86 bpm  100 bpm  -     Heart Rate (Exit)  75 bpm  77 bpm  -     Oxygen Saturation (Admit)  99 %  92 %  -     Oxygen Saturation (Exercise)  94 %  95 %  -     Oxygen Saturation (Exit)  98 %  98 %  -     Rating of Perceived Exertion (Exercise)  11  13  -     Perceived Dyspnea (Exercise)  1  1  -     Symptoms  none  none  -     Comments  walk test results  -  -     Duration  -  Continue with 45 min of aerobic exercise without signs/symptoms of physical distress.  -     Intensity  -  THRR unchanged  -       Progression   Progression  -  Continue to progress workloads to maintain intensity without signs/symptoms of physical distress.  -     Average METs  -  3.61  -       Resistance Training   Training Prescription  -  Yes  -     Weight  -  3 lbs  -     Reps  -  10-15  -       Interval Training   Interval Training  -  No  -       Treadmill   MPH  -  2.2  -     Grade  -  0.5  -     Minutes  -  15  -     METs  -  2.84  -       NuStep   Level  -  4  -     Minutes  -  15  -     METs  -  3  -  REL-XR   Level  -  2  -     Minutes  -  15  -     METs  -  5  -       Home Exercise Plan    Plans to continue exercise at  -  -  Home (comment) walking     Frequency  -  -  Add 1 additional day to program exercise sessions.     Initial Home Exercises Provided  -  -  06/26/18        Exercise Comments: Exercise Comments    Row Name 06/14/18 1126 06/25/18 1627         Exercise Comments  -  First full day of exercise was 06/17/18 !  Patient was oriented to gym and equipment including functions, settings, policies, and procedures.  Patient's individual exercise prescription and treatment plan were reviewed.  All starting workloads were established based on the results of the 6 minute walk test done at initial orientation visit.  The plan for exercise progression was also introduced and progression will be customized based on patient's performance and goals.         Exercise Goals and Review: Exercise Goals    Row Name 06/11/18 1429             Exercise Goals   Increase Physical Activity  Yes       Intervention  Provide advice, education, support and counseling about physical activity/exercise needs.;Develop an individualized exercise prescription for aerobic and resistive training based on initial evaluation findings, risk stratification, comorbidities and participant's personal goals.       Expected Outcomes  Short Term: Attend rehab on a regular basis to increase amount of physical activity.;Long Term: Add in home exercise to make exercise part of routine and to increase amount of physical activity.;Long Term: Exercising regularly at least 3-5 days a week.       Increase Strength and Stamina  Yes       Intervention  Provide advice, education, support and counseling about physical activity/exercise needs.;Develop an individualized exercise prescription for aerobic and resistive training based on initial evaluation findings, risk stratification, comorbidities and participant's personal goals.       Expected Outcomes  Short Term: Increase workloads from initial exercise  prescription for resistance, speed, and METs.;Short Term: Perform resistance training exercises routinely during rehab and add in resistance training at home;Long Term: Improve cardiorespiratory fitness, muscular endurance and strength as measured by increased METs and functional capacity (6MWT)       Able to understand and use rate of perceived exertion (RPE) scale  Yes       Intervention  Provide education and explanation on how to use RPE scale       Expected Outcomes  Short Term: Able to use RPE daily in rehab to express subjective intensity level;Long Term:  Able to use RPE to guide intensity level when exercising independently       Able to understand and use Dyspnea scale  Yes       Intervention  Provide education and explanation on how to use Dyspnea scale       Expected Outcomes  Short Term: Able to use Dyspnea scale daily in rehab to express subjective sense of shortness of breath during exertion;Long Term: Able to use Dyspnea scale to guide intensity level when exercising independently       Knowledge and understanding of Target Heart Rate Range (THRR)  Yes  Intervention  Provide education and explanation of THRR including how the numbers were predicted and where they are located for reference       Expected Outcomes  Short Term: Able to state/look up THRR;Short Term: Able to use daily as guideline for intensity in rehab;Long Term: Able to use THRR to govern intensity when exercising independently       Able to check pulse independently  Yes       Intervention  Provide education and demonstration on how to check pulse in carotid and radial arteries.;Review the importance of being able to check your own pulse for safety during independent exercise       Expected Outcomes  Short Term: Able to explain why pulse checking is important during independent exercise;Long Term: Able to check pulse independently and accurately       Understanding of Exercise Prescription  Yes       Intervention   Provide education, explanation, and written materials on patient's individual exercise prescription       Expected Outcomes  Short Term: Able to explain program exercise prescription;Long Term: Able to explain home exercise prescription to exercise independently          Exercise Goals Re-Evaluation : Exercise Goals Re-Evaluation    Row Name 06/14/18 1126 06/17/18 1628 06/25/18 1630 06/26/18 1220 06/28/18 1159     Exercise Goal Re-Evaluation   Exercise Goals Review  -  Increase Physical Activity;Increase Strength and Stamina;Understanding of Exercise Prescription;Able to understand and use rate of perceived exertion (RPE) scale;Knowledge and understanding of Target Heart Rate Range (THRR)  Increase Physical Activity;Increase Strength and Stamina;Understanding of Exercise Prescription  Increase Physical Activity;Increase Strength and Stamina;Able to understand and use rate of perceived exertion (RPE) scale;Knowledge and understanding of Target Heart Rate Range (THRR);Able to check pulse independently;Understanding of Exercise Prescription  Increase Physical Activity;Increase Strength and Stamina;Understanding of Exercise Prescription   Comments  -  Reviewed RPE scale, THR and program prescription with pt today.  Pt voiced understanding and was given a copy of goals to take home.   Karon is off to a good start in rehab.  She has been able to pick up the workloads she had gotten up to in [facility].  She has completed three full days of exercise. We will continue to monitor her progression.   Reviewed home exercise with pt today.  Pt plans to walk at home for exercise.  Reviewed THR, pulse, RPE, sign and symptoms, and when to call 911 or MD.  Also discussed weather considerations and indoor options.  Pt voiced understanding.  Jaelynne is off to a good start in rehab.  We just reviewed home exercise and she is supposed to add it in next week.  She has noticed that exercise has helped increase her strength and stamina.     Expected Outcomes  -  Short: Use RPE daily to regulate intensity. Long: Follow program prescription in THR.  Short: Review home exercise guidelines.  Long: Continue to follow program prescription.   Short: Start to walk more at home.  Long: Continue to exercise more independently.   Short: Start walking at home.  Long: Continue to increase strength and stamina.       Discharge Exercise Prescription (Final Exercise Prescription Changes): Exercise Prescription Changes - 06/26/18 1200      Home Exercise Plan   Plans to continue exercise at  Home (comment)   walking   Frequency  Add 1 additional day to program exercise sessions.  Initial Home Exercises Provided  06/26/18       Nutrition:  Target Goals: Understanding of nutrition guidelines, daily intake of sodium <158m, cholesterol <2051m calories 30% from fat and 7% or less from saturated fats, daily to have 5 or more servings of fruits and vegetables.  Biometrics: Pre Biometrics - 06/11/18 1429      Pre Biometrics   Height  5' (1.524 m)    Weight  108 lb 8 oz (49.2 kg)    Waist Circumference  26 inches    Hip Circumference  35 inches    Waist to Hip Ratio  0.74 %    BMI (Calculated)  21.19    Single Leg Stand  21.44 seconds        Nutrition Therapy Plan and Nutrition Goals: Nutrition Therapy & Goals - 06/19/18 1215      Nutrition Therapy   RD appointment deferred  Yes   has gone through the program twice and has been limiting salt and red meat for two years      Nutrition Assessments: Nutrition Assessments - 06/11/18 1238      MEDFICTS Scores   Pre Score  35       Nutrition Goals Re-Evaluation:   Nutrition Goals Discharge (Final Nutrition Goals Re-Evaluation):   Psychosocial: Target Goals: Acknowledge presence or absence of significant depression and/or stress, maximize coping skills, provide positive support system. Participant is able to verbalize types and ability to use techniques and skills needed  for reducing stress and depression.   Initial Review & Psychosocial Screening: Initial Psych Review & Screening - 06/11/18 1251      Initial Review   Current issues with  Current Sleep Concerns;Current Stress Concerns    Source of Stress Concerns  Chronic Illness    Comments  Unable to sleep good every night. Tries not to take her lorazapam at night.  Health continues to be her biggest stressor.  She is able to do things she wants to do, but often needs to stop earlier than she wants.       Family Dynamics   Good Support System?  No   Limited   Comments  Lives by her self.  Son lives in [place] Relies on neighbors and friends to take care of each other.       Barriers   Psychosocial barriers to participate in program  The patient should benefit from training in stress management and relaxation.;Psychosocial barriers identified (see note)      Screening Interventions   Interventions  Encouraged to exercise;Program counselor consult;Provide feedback about the scores to participant    Expected Outcomes  Short Term goal: Utilizing psychosocial counselor, staff and physician to assist with identification of specific Stressors or current issues interfering with healing process. Setting desired goal for each stressor or current issue identified.;Long Term Goal: Stressors or current issues are controlled or eliminated.;Short Term goal: Identification and review with participant of any Quality of Life or Depression concerns found by scoring the questionnaire.;Long Term goal: The participant improves quality of Life and PHQ9 Scores as seen by post scores and/or verbalization of changes       Quality of Life Scores:  Scores of 19 and below usually indicate a poorer quality of life in these areas.  A difference of  2-3 points is a clinically meaningful difference.  A difference of 2-3 points in the total score of the Quality of Life Index has been associated with significant improvement in overall  quality of  life, self-image, physical symptoms, and general health in studies assessing change in quality of life.  PHQ-9: Recent Review Flowsheet Data    Depression screen [facility] 2/9 06/11/2018   Decreased Interest 1   Down, Depressed, Hopeless 1   PHQ - 2 Score 2   Altered sleeping 2   Tired, decreased energy 2   Change in appetite 3   Feeling bad or failure about yourself  1   Trouble concentrating 1   Moving slowly or fidgety/restless 0   Suicidal thoughts 0   PHQ-9 Score 11   Difficult doing work/chores Not difficult at all     Interpretation of Total Score  Total Score Depression Severity:  1-4 = Minimal depression, 5-9 = Mild depression, 10-14 = Moderate depression, 15-19 = Moderately severe depression, 20-27 = Severe depression   Psychosocial Evaluation and Intervention: Psychosocial Evaluation - 06/24/18 1225      Psychosocial Evaluation & Interventions   Interventions  Encouraged to exercise with the program and follow exercise prescription    Comments  Counselor met with Ms. Ebbie Latus) today for initial psychosocial evaluation.  She is an 83 year old who has been in the Cardiac rehab and CARE programs and her Dr. recommended this program.  She has a history of lymphoma - now in remission.  She has a good support system with a son and she lives in a retirement community with good friends.  Kyndal reports sleeping "okay," with the help of medication at times.  She has a "terrible appetite" and reports this has been going on for quite some time.  Stephannie denies a history of depression or anxiety or any current symptoms and her mood is typically positive.  Her health is her primary stressor at this time.  Haru's goal for this program is to "get through it" and to increase her energy.  She will be followed by staff here.     Expected Outcomes  Short:  Fionna will benefit from consistent exercise to increase her energy and improve her sleep and appetite.   Long:  Cybele will continue  positive life style choices to improve her health.    Continue Psychosocial Services   Follow up required by staff       Psychosocial Re-Evaluation: Psychosocial Re-Evaluation    [place] Name 06/28/18 1200             Psychosocial Re-Evaluation   Current issues with  Current Sleep Concerns;Current Stress Concerns       Comments  Aster is off to a good start in rehab.  She has noticed that she sleeps better on the nights that she has rehab. She is struggling with her poor appeptite.  Her doctor is not worried about it yet.  Her health continues to be her biggest stressor.   She has some stress from her neighborhood asking for more money and she is trying to get money to fix her roof.        Expected Outcomes  Short: Continue to exericse more to sleep better.  Long: Continue to practice self care.           Psychosocial Discharge (Final Psychosocial Re-Evaluation): Psychosocial Re-Evaluation - 06/28/18 1200      Psychosocial Re-Evaluation   Current issues with  Current Sleep Concerns;Current Stress Concerns    Comments  Rudie is off to a good start in rehab.  She has noticed that she sleeps better on the nights that she has rehab. She is struggling with her  poor appeptite.  Her doctor is not worried about it yet.  Her health continues to be her biggest stressor.   She has some stress from her neighborhood asking for more money and she is trying to get money to fix her roof.     Expected Outcomes  Short: Continue to exericse more to sleep better.  Long: Continue to practice self care.        Education: Education Goals: Education classes will be provided on a weekly basis, covering required topics. Participant will state understanding/return demonstration of topics presented.  Learning Barriers/Preferences: Learning Barriers/Preferences - 06/11/18 1255      Learning Barriers/Preferences   Learning Barriers  Sight   wears glasses   Learning Preferences  None       Education  Topics:  Initial Evaluation Education: - Verbal, written and demonstration of respiratory meds, oximetry and breathing techniques. Instruction on use of nebulizers and MDIs and importance of monitoring MDI activations.   Pulmonary Rehab from 07/03/2018 in [facility] Cardiac and Pulmonary Rehab  Date  06/11/18  Educator  [facility]  Instruction Review Code  1- Verbalizes Understanding      General Nutrition Guidelines/Fats and Fiber: -Group instruction provided by verbal, written material, models and posters to present the general guidelines for heart healthy nutrition. Gives an explanation and review of dietary fats and fiber.   Pulmonary Rehab from 07/03/2018 in [facility] Cardiac and Pulmonary Rehab  Date  06/26/18  Educator  [facility]  Instruction Review Code  1- Verbalizes Understanding      Controlling Sodium/Reading Food Labels: -Group verbal and written material supporting the discussion of sodium use in heart healthy nutrition. Review and explanation with models, verbal and written materials for utilization of the food label.   Exercise Physiology & General Exercise Guidelines: - Group verbal and written instruction with models to review the exercise physiology of the cardiovascular system and associated critical values. Provides general exercise guidelines with specific guidelines to those with heart or lung disease.    Aerobic Exercise & Resistance Training: - Gives group verbal and written instruction on the various components of exercise. Focuses on aerobic and resistive training programs and the benefits of this training and how to safely progress through these programs.   Flexibility, Balance, Mind/Body Relaxation: Provides group verbal/written instruction on the benefits of flexibility and balance training, including mind/body exercise modes such as yoga, pilates and tai chi.  Demonstration and skill practice provided.   Stress and Anxiety: - Provides group verbal and written instruction  about the health risks of elevated stress and causes of high stress.  Discuss the correlation between heart/lung disease and anxiety and treatment options. Review healthy ways to manage with stress and anxiety.   Pulmonary Rehab from 07/03/2018 in [facility] Cardiac and Pulmonary Rehab  Date  07/03/18  Educator  [facility]  Instruction Review Code  1- Verbalizes Understanding      Depression: - Provides group verbal and written instruction on the correlation between heart/lung disease and depressed mood, treatment options, and the stigmas associated with seeking treatment.   Exercise & Equipment Safety: - Individual verbal instruction and demonstration of equipment use and safety with use of the equipment.   Pulmonary Rehab from 07/03/2018 in [facility] Cardiac and Pulmonary Rehab  Date  06/11/18  Educator  [facility]  Instruction Review Code  1- Verbalizes Understanding      Infection Prevention: - Provides verbal and written material to individual with discussion of infection control including proper hand washing and proper  equipment cleaning during exercise session.   Pulmonary Rehab from 07/03/2018 in [facility] Cardiac and Pulmonary Rehab  Date  06/11/18  Educator  [facility]  Instruction Review Code  1- Verbalizes Understanding      Falls Prevention: - Provides verbal and written material to individual with discussion of falls prevention and safety.   Pulmonary Rehab from 07/03/2018 in [facility] Cardiac and Pulmonary Rehab  Date  06/11/18  Educator  [facility]  Instruction Review Code  1- Verbalizes Understanding      Diabetes: - Individual verbal and written instruction to review signs/symptoms of diabetes, desired ranges of glucose level fasting, after meals and with exercise. Advice that pre and post exercise glucose checks will be done for 3 sessions at entry of program.   Chronic Lung Diseases: - Group verbal and written instruction to review updates, respiratory medications, advancements in procedures and  treatments. Discuss use of supplemental oxygen including available portable oxygen systems, continuous and intermittent flow rates, concentrators, personal use and safety guidelines. Review proper use of inhaler and spacers. Provide informative websites for self-education.    Energy Conservation: - Provide group verbal and written instruction for methods to conserve energy, plan and organize activities. Instruct on pacing techniques, use of adaptive equipment and posture/positioning to relieve shortness of breath.   Pulmonary Rehab from 07/03/2018 in [facility] Cardiac and Pulmonary Rehab  Date  06/19/18  Educator  [facility]  Instruction Review Code  1- Verbalizes Understanding      Triggers and Exacerbations: - Group verbal and written instruction to review types of environmental triggers and ways to prevent exacerbations. Discuss weather changes, air quality and the benefits of nasal washing. Review warning signs and symptoms to help prevent infections. Discuss techniques for effective airway clearance, coughing, and vibrations.   AED/CPR: - Group verbal and written instruction with the use of models to demonstrate the basic use of the AED with the basic ABC's of resuscitation.   Pulmonary Rehab from 07/03/2018 in [facility] Cardiac and Pulmonary Rehab  Date  06/28/18  Educator  [facility]  Instruction Review Code  1- Actuary and Physiology of the Lungs: - Group verbal and written instruction with the use of models to provide basic lung anatomy and physiology related to function, structure and complications of lung disease.   Anatomy & Physiology of the Heart: - Group verbal and written instruction and models provide basic cardiac anatomy and physiology, with the coronary electrical and arterial systems. Review of Valvular disease and Heart Failure   Cardiac Medications: - Group verbal and written instruction to review commonly prescribed medications for heart disease. Reviews  the medication, class of the drug, and side effects.   Pulmonary Rehab from 07/03/2018 in [facility] Cardiac and Pulmonary Rehab  Date  06/14/18  Educator  [facility]  Instruction Review Code  1- Verbalizes Understanding      Know Your Numbers and Risk Factors: -Group verbal and written instruction about important numbers in your health.  Discussion of what are risk factors and how they play a role in the disease process.  Review of Cholesterol, Blood Pressure, Diabetes, and BMI and the role they play in your overall health.   Sleep Hygiene: -Provides group verbal and written instruction about how sleep can affect your health.  Define sleep hygiene, discuss sleep cycles and impact of sleep habits. Review good sleep hygiene tips.    Other: -Provides group and verbal instruction on various topics (see comments)    Knowledge Questionnaire Score:  Knowledge Questionnaire Score - 06/11/18 1239      Knowledge Questionnaire Score   Pre Score  15/18   Reviewed with patient       Core Components/Risk Factors/Patient Goals at Admission: Personal Goals and Risk Factors at Admission - 06/11/18 1255      Core Components/Risk Factors/Patient Goals on Admission    Weight Management  Yes;Weight Maintenance   Feels worse when weight gets too low   Intervention  Weight Management: Develop a combined nutrition and exercise program designed to reach desired caloric intake, while maintaining appropriate intake of nutrient and fiber, sodium and fats, and appropriate energy expenditure required for the weight goal.;Weight Management: Provide education and appropriate resources to help participant work on and attain dietary goals.    Admit Weight  108 lb 8 oz (49.2 kg)    Goal Weight: Short Term  108 lb (49 kg)    Goal Weight: Long Term  108 lb (49 kg)    Expected Outcomes  Short Term: Continue to assess and modify interventions until short term weight is achieved;Long Term: Adherence to nutrition and physical  activity/exercise program aimed toward attainment of established weight goal;Weight Maintenance: Understanding of the daily nutrition guidelines, which includes 25-35% calories from fat, 7% or less cal from saturated fats, less than 217m cholesterol, less than 1.5gm of sodium, & 5 or more servings of fruits and vegetables daily    Improve shortness of breath with ADL's  Yes    Intervention  Provide education, individualized exercise plan and daily activity instruction to help decrease symptoms of SOB with activities of daily living.    Expected Outcomes  Short Term: Improve cardiorespiratory fitness to achieve a reduction of symptoms when performing ADLs;Long Term: Be able to perform more ADLs without symptoms or delay the onset of symptoms    Heart Failure  Yes    Intervention  Provide a combined exercise and nutrition program that is supplemented with education, support and counseling about heart failure. Directed toward relieving symptoms such as shortness of breath, decreased exercise tolerance, and extremity edema.    Expected Outcomes  Improve functional capacity of life;Short term: Attendance in program 2-3 days a week with increased exercise capacity. Reported lower sodium intake. Reported increased fruit and vegetable intake. Reports medication compliance.;Short term: Daily weights obtained and reported for increase. Utilizing diuretic protocols set by physician.;Long term: Adoption of self-care skills and reduction of barriers for early signs and symptoms recognition and intervention leading to self-care maintenance.    Hypertension  Yes    Intervention  Provide education on lifestyle modifcations including regular physical activity/exercise, weight management, moderate sodium restriction and increased consumption of fresh fruit, vegetables, and low fat dairy, alcohol moderation, and smoking cessation.;Monitor prescription use compliance.    Expected Outcomes  Short Term: Continued assessment and  intervention until BP is < 140/911mHG in hypertensive participants. < 130/8030mG in hypertensive participants with diabetes, heart failure or chronic kidney disease.;Long Term: Maintenance of blood pressure at goal levels.    Lipids  Yes    Intervention  Provide education and support for participant on nutrition & aerobic/resistive exercise along with prescribed medications to achieve LDL <17m20mDL >40mg43m Expected Outcomes  Short Term: Participant states understanding of desired cholesterol values and is compliant with medications prescribed. Participant is following exercise prescription and nutrition guidelines.;Long Term: Cholesterol controlled with medications as prescribed, with individualized exercise RX and with personalized nutrition plan. Value goals: LDL < 17mg,32m > 40  mg.       Core Components/Risk Factors/Patient Goals Review:  Goals and Risk Factor Review    Row Name 06/28/18 1203             Core Components/Risk Factors/Patient Goals Review   Personal Goals Review  Weight Management/Obesity;Heart Failure;Hypertension;Improve shortness of breath with ADL's;Lipids       Review  Her weight has been holding steady around 107-108lbs.  She is working on her diet and trying to eat good.  She has not had any signs of heart failure and overall her shortness of breath is getting better.  She is doing well with her blood pressure  and she has been checking them at home.         Expected Outcomes  Short: Continue to work on weight maintenance and not lose.  Long: Continue to monitor risk factors.           Core Components/Risk Factors/Patient Goals at Discharge (Final Review):  Goals and Risk Factor Review - 06/28/18 1203      Core Components/Risk Factors/Patient Goals Review   Personal Goals Review  Weight Management/Obesity;Heart Failure;Hypertension;Improve shortness of breath with ADL's;Lipids    Review  Her weight has been holding steady around 107-108lbs.  She is working on  her diet and trying to eat good.  She has not had any signs of heart failure and overall her shortness of breath is getting better.  She is doing well with her blood pressure  and she has been checking them at home.      Expected Outcomes  Short: Continue to work on weight maintenance and not lose.  Long: Continue to monitor risk factors.        ITP Comments: ITP Comments    Row Name 06/11/18 1236 07/08/18 0851         ITP Comments  Medical Evaluation completed. Chart sent for review and changes to Dr. Emily Filbert Director of [place]. Diagnosis can be found in [facility] encounter 04/19/18  30 day review completed. ITP sent to Dr. Emily Filbert Director of [place]. Continue with ITP unless changes are made by physician.         Comments: 30 day review

## 2018-07-10 ENCOUNTER — Encounter: Payer: Medicare Other | Admitting: *Deleted

## 2018-07-10 DIAGNOSIS — I5022 Chronic systolic (congestive) heart failure: Secondary | ICD-10-CM | POA: Diagnosis not present

## 2018-07-10 NOTE — Progress Notes (Signed)
Daily Session Note  Patient Details  Name: Jeanne Pittman MRN: [id number] Date of Birth: [birth year] Referring Provider:     Pulmonary Rehab from 06/11/2018 in [facility] Cardiac and Pulmonary Rehab  Referring Provider  Kathlyn Sacramento MD      Encounter Date: 07/10/2018  Check In: Session Check In - 07/10/18 1129      Check-In   Supervising physician immediately available to respond to emergencies  [facility] immediately available ER MD    Physician(s)  Drs. Cinda Quest and [facility]    Location  [name]-Cardiac & Pulmonary Rehab    Staff Present  Renita Papa, RN BSN;Mazell Aylesworth Luan Pulling, [place], RCEP, CCRP, Exercise Physiologist;Joseph Tessie Fass RCP,RRT,BSRT    Medication changes reported      No    Fall or balance concerns reported     No    Warm-up and Cool-down  Performed as group-led instruction    Resistance Training Performed  Yes    VAD Patient?  No    PAD/SET Patient?  No      Pain Assessment   Currently in Pain?  No/denies          Social History   Tobacco Use  Smoking Status Former Smoker  . Types: Cigarettes  . Last attempt to quit: 1950  . Years since quitting: 69.9  Smokeless Tobacco Never Used    Goals Met:  Proper associated with RPD/PD & O2 Sat Independence with exercise equipment Using PLB without cueing & demonstrates good technique Exercise tolerated well No report of cardiac concerns or symptoms Strength training completed today  Goals Unmet:  Not Applicable  Comments: Pt able to follow exercise prescription today without complaint.  Will continue to monitor for progression.    Dr. Emily Filbert is Medical Director for [place] and [facility].

## 2018-07-22 ENCOUNTER — Encounter: Payer: Medicare Other | Attending: Cardiovascular Disease

## 2018-07-22 DIAGNOSIS — I5022 Chronic systolic (congestive) heart failure: Secondary | ICD-10-CM | POA: Insufficient documentation

## 2018-07-22 DIAGNOSIS — R0602 Shortness of breath: Secondary | ICD-10-CM | POA: Insufficient documentation

## 2018-07-23 ENCOUNTER — Encounter: Payer: Self-pay | Admitting: *Deleted

## 2018-07-23 DIAGNOSIS — I5022 Chronic systolic (congestive) heart failure: Secondary | ICD-10-CM

## 2018-08-05 DIAGNOSIS — I5022 Chronic systolic (congestive) heart failure: Secondary | ICD-10-CM

## 2018-08-05 DIAGNOSIS — R0602 Shortness of breath: Secondary | ICD-10-CM | POA: Diagnosis not present

## 2018-08-05 NOTE — Progress Notes (Signed)
Pulmonary Individual Treatment Plan  Patient Details  Name: Jeanne Pittman MRN: [id number] Date of Birth: 09/22/1934 Referring Provider:     Pulmonary Rehab from 06/11/2018 in [facility] Cardiac and Pulmonary Rehab  Referring Provider  Kathlyn Sacramento MD      Initial Encounter Date:    Pulmonary Rehab from 06/11/2018 in [facility] Cardiac and Pulmonary Rehab  Date  06/11/18      Visit Diagnosis: Chronic systolic heart failure ([place])  Patient's Home Medications on Admission:  Current Outpatient Medications:  .  acetaminophen (TYLENOL) 650 MG CR tablet, Take 650 mg by mouth every 8 (eight) hours as needed for pain., Disp: , Rfl:  .  aspirin 81 MG tablet, Take 81 mg by mouth every morning. , Disp: , Rfl:  .  carvedilol (COREG) 3.125 MG tablet, TAKE 1 TABLET BY MOUTH TWICE DAILY WITH A MEAL, Disp: 60 tablet, Rfl: 6 .  Cholecalciferol (VITAMIN D-3) 5000 UNITS TABS, Take 2,000 Int'l Units by mouth every morning. , Disp: , Rfl:  .  fexofenadine (ALLEGRA) 180 MG tablet, Take 180 mg by mouth daily as needed for rhinitis. , Disp: , Rfl:  .  Fluocinolone Acetonide Scalp 0.01 % OIL, as needed., Disp: , Rfl:  .  furosemide (LASIX) 20 MG tablet, Take 1 tablet (20 mg total) by mouth daily. (Patient taking differently: Take 20 mg by mouth daily as needed. ), Disp: 30 tablet, Rfl: 6 .  gabapentin (NEURONTIN) 300 MG capsule, Take 300 mg by mouth as needed., Disp: , Rfl:  .  latanoprost (XALATAN) 0.005 % ophthalmic solution, Place 1 drop into both eyes at bedtime. , Disp: , Rfl:  .  LORazepam (ATIVAN) 0.5 MG tablet, Take 0.5 mg by mouth every 8 (eight) hours as needed for anxiety. , Disp: , Rfl:  .  losartan (COZAAR) 25 MG tablet, TAKE 1/2 TABLET(12.5 MG) BY MOUTH DAILY AFTER BREAKFAST (Patient taking differently: Take 25 mg by mouth daily. ), Disp: 15 tablet, Rfl: 8 .  mupirocin ointment (BACTROBAN) 2 %, Apply topically as needed., Disp: , Rfl:  .  oxybutynin (DITROPAN-XL) 10 MG 24 hr tablet, Take 1 tablet (10 mg  total) by mouth daily. (Patient not taking: Reported on 06/11/2018), Disp: 30 tablet, Rfl: 3 .  pantoprazole (PROTONIX) 40 MG tablet, TAKE 1 TABLET(40 MG) BY MOUTH TWICE DAILY, Disp: 60 tablet, Rfl: 3 .  rosuvastatin (CRESTOR) 10 MG tablet, TAKE 1 TABLET(10 MG) BY MOUTH EVERY OTHER DAY, Disp: 45 tablet, Rfl: 2 .  timolol (TIMOPTIC) 0.5 % ophthalmic solution, Place 1 drop into both eyes 2 (two) times daily. , Disp: , Rfl:  .  triamcinolone ointment (KENALOG) 0.1 %, as needed., Disp: , Rfl:   Past Medical History: Past Medical History:  Diagnosis Date  . A-fib ([place])    07/2012: A. fib with RVR in the setting of myocardial infarction. Converted to sinus rhythm with amiodarone. No recurrence.  . Anxiety   . Atrophic vaginitis   . Chronic combined systolic (congestive) and diastolic (congestive) heart failure ([place])    a. EF was 25-35% post MI but improved to 45-50% in 04/2013; b. 03/2017 Echo: EF 40-45%, mod focal/basal hypertrophy of septum. Sev mid-apicalanteroseptal, ant, and apical HK. Gr1 DD, mild AI/MR, mod TR, PASP 20mg.  . Colon adenomas   . Coronary artery disease    a. 100/8676STEMI complicated by cardiogenic shock. Cath/PCI: LAD [id number]), RCA 95p(DES). EF 25%; b. 03/2017 MV: EF 57%, fixed apical, periapical, mid to distal anteroseptal defect. No ischemia.  .[name]  Dysrhythmia    A-fib  . Fibrocystic breast disease   . GERD (gastroesophageal reflux disease)   . Glaucoma   . History of gastritis   . Hyperlipidemia   . Hypertension   . Insomnia   . Ischemic cardiomyopathy    a. 07/2012 EF 25-35% following MI-->Improved to 45-50%;  b. 03/2017 Echo: EF 40-45%.  . Myocardial infarction ([place]) 08/18/12  . Non Hodgkin's lymphoma ([place]) 2005   reoccurance 2007 and 2012  . Osteoarthritis   . Polyneuropathy   . Polyposis of colon   . Restless leg syndrome   . Urinary, incontinence, stress female     Tobacco Use: Social History   Tobacco Use  Smoking Status Former Smoker  . Types:  Cigarettes  . Last attempt to quit: 1950  . Years since quitting: 70.0  Smokeless Tobacco Never Used    Labs: Recent Review Scientist, physiological    Labs for ITP Cardiac and Pulmonary Rehab Latest Ref Rng & Units 04/07/2013 09/02/2015   Cholestrol 100 - 199 mg/dL 136 172   LDLCALC 0 - 99 mg/dL 58 99   HDL >39 mg/dL 54 51   Trlycerides 0 - 149 mg/dL 120 111       Pulmonary Assessment Scores: Pulmonary Assessment Scores    Row Name 06/11/18 1238         ADL [facility]SD   ADL Phase  Entry     SOB Score total  75     Rest  1     Walk  2     Stairs  3     Bath  3     Dress  3     Shop  4       CAT Score   CAT Score  22       mMRC Score   mMRC Score  2        Pulmonary Function Assessment:   Exercise Target Goals: Exercise Program Goal: Individual exercise prescription set using results from initial 6 min walk test and THRR while considering  patient's activity barriers and safety.   Exercise Prescription Goal: Initial exercise prescription builds to 30-45 minutes a day of aerobic activity, 2-3 days per week.  Home exercise guidelines will be given to patient during program as part of exercise prescription that the participant will acknowledge.  Activity Barriers & Risk Stratification: Activity Barriers & Cardiac Risk Stratification - 06/11/18 1320      Activity Barriers & Cardiac Risk Stratification   Activity Barriers  Decreased Ventricular Function;Deconditioning;Muscular Weakness;Shortness of Breath       6 Minute Walk: 6 Minute Walk    Row Name 06/11/18 1314         6 Minute Walk   Phase  Initial     Distance  1170 feet     Walk Time  6 minutes     # of Rest Breaks  0     MPH  2.22     METS  2.04     RPE  11     Perceived Dyspnea   1     VO2 Peak  7.15     Symptoms  No     Resting HR  68 bpm     Resting BP  122/70     Resting Oxygen Saturation   99 %     Exercise Oxygen Saturation  during 6 min walk  94 %     Max Ex. HR  86 bpm  Max Ex. BP  124/66      2 Minute Post BP  128/72       Interval HR   1 Minute HR  76     2 Minute HR  83     3 Minute HR  82     4 Minute HR  84     5 Minute HR  84     6 Minute HR  86     2 Minute Post HR  75     Interval Heart Rate?  Yes       Interval Oxygen   Interval Oxygen?  Yes     Baseline Oxygen Saturation %  99 %     1 Minute Oxygen Saturation %  95 %     1 Minute Liters of Oxygen  0 L Room Air     2 Minute Oxygen Saturation %  95 %     2 Minute Liters of Oxygen  0 L     3 Minute Oxygen Saturation %  94 %     3 Minute Liters of Oxygen  0 L     4 Minute Oxygen Saturation %  95 %     4 Minute Liters of Oxygen  0 L     5 Minute Oxygen Saturation %  94 %     5 Minute Liters of Oxygen  0 L     6 Minute Oxygen Saturation %  95 %     6 Minute Liters of Oxygen  0 L     2 Minute Post Oxygen Saturation %  98 %     2 Minute Post Liters of Oxygen  0 L       Oxygen Initial Assessment: Oxygen Initial Assessment - 06/11/18 1250      Home Oxygen   Home Oxygen Device  None    Sleep Oxygen Prescription  None    Home Exercise Oxygen Prescription  None    Home at Rest Exercise Oxygen Prescription  None      Initial 6 min Walk   Oxygen Used  None      Program Oxygen Prescription   Program Oxygen Prescription  None      Intervention   Short Term Goals  To learn and understand importance of monitoring SPO2 with pulse oximeter and demonstrate accurate use of the pulse oximeter.;To learn and understand importance of maintaining oxygen saturations>88%;To learn and demonstrate proper pursed lip breathing techniques or other breathing techniques.    Long  Term Goals  Verbalizes importance of monitoring SPO2 with pulse oximeter and return demonstration;Maintenance of O2 saturations>88%;Exhibits proper breathing techniques, such as pursed lip breathing or other method taught during program session       Oxygen Re-Evaluation: Oxygen Re-Evaluation    Row Name 06/14/18 1127 06/17/18 1628 06/28/18 1205          Program Oxygen Prescription   Program Oxygen Prescription  -  -  None       Home Oxygen   Home Oxygen Device  -  -  None     Sleep Oxygen Prescription  -  -  None     Home Exercise Oxygen Prescription  -  -  None     Home at Rest Exercise Oxygen Prescription  -  -  None       Goals/Expected Outcomes   Short Term Goals  -  To learn and understand importance of monitoring SPO2 with pulse oximeter  and demonstrate accurate use of the pulse oximeter.;To learn and understand importance of maintaining oxygen saturations>88%;To learn and demonstrate proper pursed lip breathing techniques or other breathing techniques.  To learn and understand importance of monitoring SPO2 with pulse oximeter and demonstrate accurate use of the pulse oximeter.;To learn and understand importance of maintaining oxygen saturations>88%;To learn and demonstrate proper pursed lip breathing techniques or other breathing techniques.     Long  Term Goals  -  Verbalizes importance of monitoring SPO2 with pulse oximeter and return demonstration;Maintenance of O2 saturations>88%;Exhibits proper breathing techniques, such as pursed lip breathing or other method taught during program session  Verbalizes importance of monitoring SPO2 with pulse oximeter and return demonstration;Maintenance of O2 saturations>88%;Exhibits proper breathing techniques, such as pursed lip breathing or other method taught during program session     Comments  -  Reviewed PLB technique with pt.  Talked about how it work and it's important to maintaining his exercise saturations.    Manjit is off to a good start in rehab.  She is doing better with her breathing.  She has not gotten a pulse oximeter yet, but is still planning to.   She has been doing well with her PLB and finds it helpful when she is SOB.  She will sit down and use her PLB to recover.       Goals/Expected Outcomes  -  Short: Become more profiecient at using PLB.   Long: Become independent at  using PLB.  Short: Get pulse oximeter  Long: Continue to work on using PLB.         Oxygen Discharge (Final Oxygen Re-Evaluation): Oxygen Re-Evaluation - 06/28/18 1205      Program Oxygen Prescription   Program Oxygen Prescription  None      Home Oxygen   Home Oxygen Device  None    Sleep Oxygen Prescription  None    Home Exercise Oxygen Prescription  None    Home at Rest Exercise Oxygen Prescription  None      Goals/Expected Outcomes   Short Term Goals  To learn and understand importance of monitoring SPO2 with pulse oximeter and demonstrate accurate use of the pulse oximeter.;To learn and understand importance of maintaining oxygen saturations>88%;To learn and demonstrate proper pursed lip breathing techniques or other breathing techniques.    Long  Term Goals  Verbalizes importance of monitoring SPO2 with pulse oximeter and return demonstration;Maintenance of O2 saturations>88%;Exhibits proper breathing techniques, such as pursed lip breathing or other method taught during program session    Comments  Doral is off to a good start in rehab.  She is doing better with her breathing.  She has not gotten a pulse oximeter yet, but is still planning to.   She has been doing well with her PLB and finds it helpful when she is SOB.  She will sit down and use her PLB to recover.      Goals/Expected Outcomes  Short: Get pulse oximeter  Long: Continue to work on using PLB.        Initial Exercise Prescription: Initial Exercise Prescription - 06/11/18 1400      Date of Initial Exercise RX and Referring Provider   Date  06/11/18    Referring Provider  Kathlyn Sacramento MD      Treadmill   MPH  2.2    Grade  0.5    Minutes  15    METs  2.84      NuStep   Level  4  SPM  80    Minutes  15    METs  2.2      REL-XR   Level  2    Speed  50    Minutes  15    METs  2.2      Prescription Details   Frequency (times per week)  3    Duration  Progress to 45 minutes of aerobic exercise  without signs/symptoms of physical distress      Intensity   THRR 40-80% of Max Heartrate  96-125    Ratings of Perceived Exertion  11-13    Perceived Dyspnea  0-4      Progression   Progression  Continue to progress workloads to maintain intensity without signs/symptoms of physical distress.      Resistance Training   Training Prescription  Yes    Weight  3 lbs    Reps  10-15       Perform Capillary Blood Glucose checks as needed.  Exercise Prescription Changes: Exercise Prescription Changes    Row Name 06/11/18 1400 06/25/18 1600 06/26/18 1200 07/10/18 1500       Response to Exercise   Blood Pressure (Admit)  122/70  124/60  -  128/78    Blood Pressure (Exercise)  124/66  134/64  -  -    Blood Pressure (Exit)  128/72  110/62  -  100/54    Heart Rate (Admit)  68 bpm  109 bpm  -  82 bpm    Heart Rate (Exercise)  86 bpm  100 bpm  -  99 bpm    Heart Rate (Exit)  75 bpm  77 bpm  -  73 bpm    Oxygen Saturation (Admit)  99 %  92 %  -  100 %    Oxygen Saturation (Exercise)  94 %  95 %  -  93 %    Oxygen Saturation (Exit)  98 %  98 %  -  98 %    Rating of Perceived Exertion (Exercise)  11  13  -  15    Perceived Dyspnea (Exercise)  1  1  -  2    Symptoms  none  none  -  none    Comments  walk test results  -  -  -    Duration  -  Continue with 45 min of aerobic exercise without signs/symptoms of physical distress.  -  Continue with 45 min of aerobic exercise without signs/symptoms of physical distress.    Intensity  -  THRR unchanged  -  THRR unchanged      Progression   Progression  -  Continue to progress workloads to maintain intensity without signs/symptoms of physical distress.  -  Continue to progress workloads to maintain intensity without signs/symptoms of physical distress.    Average METs  -  3.61  -  2.94      Resistance Training   Training Prescription  -  Yes  -  Yes    Weight  -  3 lbs  -  3 lbs    Reps  -  10-15  -  10-15      Interval Training   Interval  Training  -  No  -  No      Treadmill   MPH  -  2.2  -  2.2    Grade  -  0.5  -  0.5    Minutes  -  15  -  1    METs  -  2.84  -  2.84      NuStep   Level  -  4  -  5    Minutes  -  15  -  15    METs  -  3  -  2.4      REL-XR   Level  -  2  -  2    Minutes  -  15  -  15    METs  -  5  -  3.6      Home Exercise Plan   Plans to continue exercise at  -  -  Home (comment) walking  Home (comment) walking    Frequency  -  -  Add 1 additional day to program exercise sessions.  Add 1 additional day to program exercise sessions.    Initial Home Exercises Provided  -  -  06/26/18  06/26/18       Exercise Comments: Exercise Comments    Row Name 06/14/18 1126 06/25/18 1627         Exercise Comments  -  First full day of exercise was 06/17/18 !  Patient was oriented to gym and equipment including functions, settings, policies, and procedures.  Patient's individual exercise prescription and treatment plan were reviewed.  All starting workloads were established based on the results of the 6 minute walk test done at initial orientation visit.  The plan for exercise progression was also introduced and progression will be customized based on patient's performance and goals.         Exercise Goals and Review: Exercise Goals    Row Name 06/11/18 1429             Exercise Goals   Increase Physical Activity  Yes       Intervention  Provide advice, education, support and counseling about physical activity/exercise needs.;Develop an individualized exercise prescription for aerobic and resistive training based on initial evaluation findings, risk stratification, comorbidities and participant's personal goals.       Expected Outcomes  Short Term: Attend rehab on a regular basis to increase amount of physical activity.;Long Term: Add in home exercise to make exercise part of routine and to increase amount of physical activity.;Long Term: Exercising regularly at least 3-5 days a week.        Increase Strength and Stamina  Yes       Intervention  Provide advice, education, support and counseling about physical activity/exercise needs.;Develop an individualized exercise prescription for aerobic and resistive training based on initial evaluation findings, risk stratification, comorbidities and participant's personal goals.       Expected Outcomes  Short Term: Increase workloads from initial exercise prescription for resistance, speed, and METs.;Short Term: Perform resistance training exercises routinely during rehab and add in resistance training at home;Long Term: Improve cardiorespiratory fitness, muscular endurance and strength as measured by increased METs and functional capacity (6MWT)       Able to understand and use rate of perceived exertion (RPE) scale  Yes       Intervention  Provide education and explanation on how to use RPE scale       Expected Outcomes  Short Term: Able to use RPE daily in rehab to express subjective intensity level;Long Term:  Able to use RPE to guide intensity level when exercising independently       Able to understand and use Dyspnea scale  Yes       Intervention  Provide education and explanation on how to use Dyspnea scale       Expected Outcomes  Short Term: Able to use Dyspnea scale daily in rehab to express subjective sense of shortness of breath during exertion;Long Term: Able to use Dyspnea scale to guide intensity level when exercising independently       Knowledge and understanding of Target Heart Rate Range (THRR)  Yes       Intervention  Provide education and explanation of THRR including how the numbers were predicted and where they are located for reference       Expected Outcomes  Short Term: Able to state/look up THRR;Short Term: Able to use daily as guideline for intensity in rehab;Long Term: Able to use THRR to govern intensity when exercising independently       Able to check pulse independently  Yes       Intervention  Provide education and  demonstration on how to check pulse in carotid and radial arteries.;Review the importance of being able to check your own pulse for safety during independent exercise       Expected Outcomes  Short Term: Able to explain why pulse checking is important during independent exercise;Long Term: Able to check pulse independently and accurately       Understanding of Exercise Prescription  Yes       Intervention  Provide education, explanation, and written materials on patient's individual exercise prescription       Expected Outcomes  Short Term: Able to explain program exercise prescription;Long Term: Able to explain home exercise prescription to exercise independently          Exercise Goals Re-Evaluation : Exercise Goals Re-Evaluation    Row Name 06/14/18 1126 06/17/18 1628 06/25/18 1630 06/26/18 1220 06/28/18 1159     Exercise Goal Re-Evaluation   Exercise Goals Review  -  Increase Physical Activity;Increase Strength and Stamina;Understanding of Exercise Prescription;Able to understand and use rate of perceived exertion (RPE) scale;Knowledge and understanding of Target Heart Rate Range (THRR)  Increase Physical Activity;Increase Strength and Stamina;Understanding of Exercise Prescription  Increase Physical Activity;Increase Strength and Stamina;Able to understand and use rate of perceived exertion (RPE) scale;Knowledge and understanding of Target Heart Rate Range (THRR);Able to check pulse independently;Understanding of Exercise Prescription  Increase Physical Activity;Increase Strength and Stamina;Understanding of Exercise Prescription   Comments  -  Reviewed RPE scale, THR and program prescription with pt today.  Pt voiced understanding and was given a copy of goals to take home.   Rockell is off to a good start in rehab.  She has been able to pick up the workloads she had gotten up to in [facility].  She has completed three full days of exercise. We will continue to monitor her progression.   Reviewed home  exercise with pt today.  Pt plans to walk at home for exercise.  Reviewed THR, pulse, RPE, sign and symptoms, and when to call 911 or MD.  Also discussed weather considerations and indoor options.  Pt voiced understanding.  Onisha is off to a good start in rehab.  We just reviewed home exercise and she is supposed to add it in next week.  She has noticed that exercise has helped increase her strength and stamina.    Expected Outcomes  -  Short: Use RPE daily to regulate intensity. Long: Follow program prescription in THR.  Short: Review home exercise guidelines.  Long: Continue to follow program prescription.   Short: Start to walk more at home.  Long: Continue to exercise more independently.   Short: Start walking at home.  Long: Continue to increase strength and stamina.    [place] Name 07/10/18 1522 07/23/18 1533           Exercise Goal Re-Evaluation   Exercise Goals Review  Increase Physical Activity;Increase Strength and Stamina;Understanding of Exercise Prescription  -      Comments  Cherina has been doing well in rehab.  She will be out at least through next week as she is having eye surgery on Friday.  She now up to level 5 on the NuStep.  We will continue to monitor her progress.   Out since last review      Expected Outcomes  Short: Increase XR.  Long: Continue to walk more at home.   -         Discharge Exercise Prescription (Final Exercise Prescription Changes): Exercise Prescription Changes - 07/10/18 1500      Response to Exercise   Blood Pressure (Admit)  128/78    Blood Pressure (Exit)  100/54    Heart Rate (Admit)  82 bpm    Heart Rate (Exercise)  99 bpm    Heart Rate (Exit)  73 bpm    Oxygen Saturation (Admit)  100 %    Oxygen Saturation (Exercise)  93 %    Oxygen Saturation (Exit)  98 %    Rating of Perceived Exertion (Exercise)  15    Perceived Dyspnea (Exercise)  2    Symptoms  none    Duration  Continue with 45 min of aerobic exercise without signs/symptoms of physical  distress.    Intensity  THRR unchanged      Progression   Progression  Continue to progress workloads to maintain intensity without signs/symptoms of physical distress.    Average METs  2.94      Resistance Training   Training Prescription  Yes    Weight  3 lbs    Reps  10-15      Interval Training   Interval Training  No      Treadmill   MPH  2.2    Grade  0.5    Minutes  1    METs  2.84      NuStep   Level  5    Minutes  15    METs  2.4      REL-XR   Level  2    Minutes  15    METs  3.6      Home Exercise Plan   Plans to continue exercise at  Home (comment)   walking   Frequency  Add 1 additional day to program exercise sessions.    Initial Home Exercises Provided  06/26/18       Nutrition:  Target Goals: Understanding of nutrition guidelines, daily intake of sodium '1500mg'$ , cholesterol '200mg'$ , calories 30% from fat and 7% or less from saturated fats, daily to have 5 or more servings of fruits and vegetables.  Biometrics: Pre Biometrics - 06/11/18 1429      Pre Biometrics   Height  5' (1.524 m)    Weight  108 lb 8 oz (49.2 kg)    Waist Circumference  26 inches    Hip Circumference  35 inches    Waist to Hip Ratio  0.74 %    BMI (Calculated)  21.19    Single Leg Stand  21.44 seconds        Nutrition Therapy Plan and Nutrition Goals:  Nutrition Therapy & Goals - 06/19/18 1215      Nutrition Therapy   RD appointment deferred  Yes   has gone through the program twice and has been limiting salt and red meat for two years      Nutrition Assessments: Nutrition Assessments - 06/11/18 1238      MEDFICTS Scores   Pre Score  35       Nutrition Goals Re-Evaluation:   Nutrition Goals Discharge (Final Nutrition Goals Re-Evaluation):   Psychosocial: Target Goals: Acknowledge presence or absence of significant depression and/or stress, maximize coping skills, provide positive support system. Participant is able to verbalize types and ability to use  techniques and skills needed for reducing stress and depression.   Initial Review & Psychosocial Screening: Initial Psych Review & Screening - 06/11/18 1251      Initial Review   Current issues with  Current Sleep Concerns;Current Stress Concerns    Source of Stress Concerns  Chronic Illness    Comments  Unable to sleep good every night. Tries not to take her lorazapam at night.  Health continues to be her biggest stressor.  She is able to do things she wants to do, but often needs to stop earlier than she wants.       Family Dynamics   Good Support System?  No   Limited   Comments  Lives by her self.  Son lives in [place].  Relies on neighbors and friends to take care of each other.       Barriers   Psychosocial barriers to participate in program  The patient should benefit from training in stress management and relaxation.;Psychosocial barriers identified (see note)      Screening Interventions   Interventions  Encouraged to exercise;Program counselor consult;Provide feedback about the scores to participant    Expected Outcomes  Short Term goal: Utilizing psychosocial counselor, staff and physician to assist with identification of specific Stressors or current issues interfering with healing process. Setting desired goal for each stressor or current issue identified.;Long Term Goal: Stressors or current issues are controlled or eliminated.;Short Term goal: Identification and review with participant of any Quality of Life or Depression concerns found by scoring the questionnaire.;Long Term goal: The participant improves quality of Life and PHQ9 Scores as seen by post scores and/or verbalization of changes       Quality of Life Scores:  Scores of 19 and below usually indicate a poorer quality of life in these areas.  A difference of  2-3 points is a clinically meaningful difference.  A difference of 2-3 points in the total score of the Quality of Life Index has been associated with  significant improvement in overall quality of life, self-image, physical symptoms, and general health in studies assessing change in quality of life.  PHQ-9: Recent Review Flowsheet Data    Depression screen [facility] 2/9 06/11/2018   Decreased Interest 1   Down, Depressed, Hopeless 1   PHQ - 2 Score 2   Altered sleeping 2   Tired, decreased energy 2   Change in appetite 3   Feeling bad or failure about yourself  1   Trouble concentrating 1   Moving slowly or fidgety/restless 0   Suicidal thoughts 0   PHQ-9 Score 11   Difficult doing work/chores Not difficult at all     Interpretation of Total Score  Total Score Depression Severity:  1-4 = Minimal depression, 5-9 = Mild depression, 10-14 = Moderate depression, 15-19 = Moderately severe depression,  20-27 = Severe depression   Psychosocial Evaluation and Intervention: Psychosocial Evaluation - 06/24/18 1225      Psychosocial Evaluation & Interventions   Interventions  Encouraged to exercise with the program and follow exercise prescription    Comments  Counselor met with Ms. Ebbie Latus) today for initial psychosocial evaluation.  She is an 83 year old who has been in the Cardiac rehab and CARE programs and her Dr. recommended this program.  She has a history of lymphoma - now in remission.  She has a good support system with a son and she lives in a retirement community with good friends.  Merida reports sleeping "okay," with the help of medication at times.  She has a "terrible appetite" and reports this has been going on for quite some time.  Marlo denies a history of depression or anxiety or any current symptoms and her mood is typically positive.  Her health is her primary stressor at this time.  Trulee's goal for this program is to "get through it" and to increase her energy.  She will be followed by staff here.     Expected Outcomes  Short:  Athena will benefit from consistent exercise to increase her energy and improve her sleep and  appetite.   Long:  Amiya will continue positive life style choices to improve her health.    Continue Psychosocial Services   Follow up required by staff       Psychosocial Re-Evaluation: Psychosocial Re-Evaluation    [place] Name 06/28/18 1200             Psychosocial Re-Evaluation   Current issues with  Current Sleep Concerns;Current Stress Concerns       Comments  Myasia is off to a good start in rehab.  She has noticed that she sleeps better on the nights that she has rehab. She is struggling with her poor appeptite.  Her doctor is not worried about it yet.  Her health continues to be her biggest stressor.   She has some stress from her neighborhood asking for more money and she is trying to get money to fix her roof.        Expected Outcomes  Short: Continue to exericse more to sleep better.  Long: Continue to practice self care.           Psychosocial Discharge (Final Psychosocial Re-Evaluation): Psychosocial Re-Evaluation - 06/28/18 1200      Psychosocial Re-Evaluation   Current issues with  Current Sleep Concerns;Current Stress Concerns    Comments  Endiya is off to a good start in rehab.  She has noticed that she sleeps better on the nights that she has rehab. She is struggling with her poor appeptite.  Her doctor is not worried about it yet.  Her health continues to be her biggest stressor.   She has some stress from her neighborhood asking for more money and she is trying to get money to fix her roof.     Expected Outcomes  Short: Continue to exericse more to sleep better.  Long: Continue to practice self care.        Education: Education Goals: Education classes will be provided on a weekly basis, covering required topics. Participant will state understanding/return demonstration of topics presented.  Learning Barriers/Preferences: Learning Barriers/Preferences - 06/11/18 1255      Learning Barriers/Preferences   Learning Barriers  Sight   wears glasses   Learning  Preferences  None       Education Topics:  Initial Evaluation Education: - Verbal, written and demonstration of respiratory meds, oximetry and breathing techniques. Instruction on use of nebulizers and MDIs and importance of monitoring MDI activations.   Pulmonary Rehab from 07/10/2018 in [facility] Cardiac and Pulmonary Rehab  Date  06/11/18  Educator  [facility]  Instruction Review Code  1- Verbalizes Understanding      General Nutrition Guidelines/Fats and Fiber: -Group instruction provided by verbal, written material, models and posters to present the general guidelines for heart healthy nutrition. Gives an explanation and review of dietary fats and fiber.   Pulmonary Rehab from 07/10/2018 in [facility] Cardiac and Pulmonary Rehab  Date  06/26/18  Educator  [facility]  Instruction Review Code  1- Verbalizes Understanding      Controlling Sodium/Reading Food Labels: -Group verbal and written material supporting the discussion of sodium use in heart healthy nutrition. Review and explanation with models, verbal and written materials for utilization of the food label.   Pulmonary Rehab from 07/10/2018 in [facility] Cardiac and Pulmonary Rehab  Date  07/10/18  Educator  [facility]  Instruction Review Code  1- Verbalizes Understanding      Exercise Physiology & General Exercise Guidelines: - Group verbal and written instruction with models to review the exercise physiology of the cardiovascular system and associated critical values. Provides general exercise guidelines with specific guidelines to those with heart or lung disease.    Aerobic Exercise & Resistance Training: - Gives group verbal and written instruction on the various components of exercise. Focuses on aerobic and resistive training programs and the benefits of this training and how to safely progress through these programs.   Flexibility, Balance, Mind/Body Relaxation: Provides group verbal/written instruction on the benefits of flexibility and balance  training, including mind/body exercise modes such as yoga, pilates and tai chi.  Demonstration and skill practice provided.   Stress and Anxiety: - Provides group verbal and written instruction about the health risks of elevated stress and causes of high stress.  Discuss the correlation between heart/lung disease and anxiety and treatment options. Review healthy ways to manage with stress and anxiety.   Pulmonary Rehab from 07/10/2018 in [facility] Cardiac and Pulmonary Rehab  Date  07/03/18  Educator  [facility]  Instruction Review Code  1- Verbalizes Understanding      Depression: - Provides group verbal and written instruction on the correlation between heart/lung disease and depressed mood, treatment options, and the stigmas associated with seeking treatment.   Exercise & Equipment Safety: - Individual verbal instruction and demonstration of equipment use and safety with use of the equipment.   Pulmonary Rehab from 07/10/2018 in [facility] Cardiac and Pulmonary Rehab  Date  06/11/18  Educator  [facility]  Instruction Review Code  1- Verbalizes Understanding      Infection Prevention: - Provides verbal and written material to individual with discussion of infection control including proper hand washing and proper equipment cleaning during exercise session.   Pulmonary Rehab from 07/10/2018 in [facility] Cardiac and Pulmonary Rehab  Date  06/11/18  Educator  [facility]  Instruction Review Code  1- Verbalizes Understanding      Falls Prevention: - Provides verbal and written material to individual with discussion of falls prevention and safety.   Pulmonary Rehab from 07/10/2018 in [facility] Cardiac and Pulmonary Rehab  Date  06/11/18  Educator  [facility]  Instruction Review Code  1- Verbalizes Understanding      Diabetes: - Individual verbal and written instruction to review signs/symptoms of diabetes, desired ranges of glucose level fasting,  after meals and with exercise. Advice that pre and post exercise glucose checks  will be done for 3 sessions at entry of program.   Chronic Lung Diseases: - Group verbal and written instruction to review updates, respiratory medications, advancements in procedures and treatments. Discuss use of supplemental oxygen including available portable oxygen systems, continuous and intermittent flow rates, concentrators, personal use and safety guidelines. Review proper use of inhaler and spacers. Provide informative websites for self-education.    Energy Conservation: - Provide group verbal and written instruction for methods to conserve energy, plan and organize activities. Instruct on pacing techniques, use of adaptive equipment and posture/positioning to relieve shortness of breath.   Pulmonary Rehab from 07/10/2018 in [facility] Cardiac and Pulmonary Rehab  Date  06/19/18  Educator  [facility]  Instruction Review Code  1- Verbalizes Understanding      Triggers and Exacerbations: - Group verbal and written instruction to review types of environmental triggers and ways to prevent exacerbations. Discuss weather changes, air quality and the benefits of nasal washing. Review warning signs and symptoms to help prevent infections. Discuss techniques for effective airway clearance, coughing, and vibrations.   AED/CPR: - Group verbal and written instruction with the use of models to demonstrate the basic use of the AED with the basic ABC's of resuscitation.   Pulmonary Rehab from 07/10/2018 in [facility] Cardiac and Pulmonary Rehab  Date  06/28/18  Educator  [facility]  Instruction Review Code  1- Actuary and Physiology of the Lungs: - Group verbal and written instruction with the use of models to provide basic lung anatomy and physiology related to function, structure and complications of lung disease.   Anatomy & Physiology of the Heart: - Group verbal and written instruction and models provide basic cardiac anatomy and physiology, with the coronary electrical and  arterial systems. Review of Valvular disease and Heart Failure   Cardiac Medications: - Group verbal and written instruction to review commonly prescribed medications for heart disease. Reviews the medication, class of the drug, and side effects.   Pulmonary Rehab from 07/10/2018 in [facility] Cardiac and Pulmonary Rehab  Date  06/14/18  Educator  [facility]  Instruction Review Code  1- Verbalizes Understanding      Know Your Numbers and Risk Factors: -Group verbal and written instruction about important numbers in your health.  Discussion of what are risk factors and how they play a role in the disease process.  Review of Cholesterol, Blood Pressure, Diabetes, and BMI and the role they play in your overall health.   Sleep Hygiene: -Provides group verbal and written instruction about how sleep can affect your health.  Define sleep hygiene, discuss sleep cycles and impact of sleep habits. Review good sleep hygiene tips.    Other: -Provides group and verbal instruction on various topics (see comments)    Knowledge Questionnaire Score: Knowledge Questionnaire Score - 06/11/18 1239      Knowledge Questionnaire Score   Pre Score  15/18   Reviewed with patient       Core Components/Risk Factors/Patient Goals at Admission: Personal Goals and Risk Factors at Admission - 06/11/18 1255      Core Components/Risk Factors/Patient Goals on Admission    Weight Management  Yes;Weight Maintenance   Feels worse when weight gets too low   Intervention  Weight Management: Develop a combined nutrition and exercise program designed to reach desired caloric intake, while maintaining appropriate intake of nutrient and fiber, sodium and fats, and  appropriate energy expenditure required for the weight goal.;Weight Management: Provide education and appropriate resources to help participant work on and attain dietary goals.    Admit Weight  108 lb 8 oz (49.2 kg)    Goal Weight: Short Term  108 lb (49 kg)    Goal  Weight: Long Term  108 lb (49 kg)    Expected Outcomes  Short Term: Continue to assess and modify interventions until short term weight is achieved;Long Term: Adherence to nutrition and physical activity/exercise program aimed toward attainment of established weight goal;Weight Maintenance: Understanding of the daily nutrition guidelines, which includes 25-35% calories from fat, 7% or less cal from saturated fats, less than 286m cholesterol, less than 1.5gm of sodium, & 5 or more servings of fruits and vegetables daily    Improve shortness of breath with ADL's  Yes    Intervention  Provide education, individualized exercise plan and daily activity instruction to help decrease symptoms of SOB with activities of daily living.    Expected Outcomes  Short Term: Improve cardiorespiratory fitness to achieve a reduction of symptoms when performing ADLs;Long Term: Be able to perform more ADLs without symptoms or delay the onset of symptoms    Heart Failure  Yes    Intervention  Provide a combined exercise and nutrition program that is supplemented with education, support and counseling about heart failure. Directed toward relieving symptoms such as shortness of breath, decreased exercise tolerance, and extremity edema.    Expected Outcomes  Improve functional capacity of life;Short term: Attendance in program 2-3 days a week with increased exercise capacity. Reported lower sodium intake. Reported increased fruit and vegetable intake. Reports medication compliance.;Short term: Daily weights obtained and reported for increase. Utilizing diuretic protocols set by physician.;Long term: Adoption of self-care skills and reduction of barriers for early signs and symptoms recognition and intervention leading to self-care maintenance.    Hypertension  Yes    Intervention  Provide education on lifestyle modifcations including regular physical activity/exercise, weight management, moderate sodium restriction and increased  consumption of fresh fruit, vegetables, and low fat dairy, alcohol moderation, and smoking cessation.;Monitor prescription use compliance.    Expected Outcomes  Short Term: Continued assessment and intervention until BP is < 140/958mHG in hypertensive participants. < 130/8078mG in hypertensive participants with diabetes, heart failure or chronic kidney disease.;Long Term: Maintenance of blood pressure at goal levels.    Lipids  Yes    Intervention  Provide education and support for participant on nutrition & aerobic/resistive exercise along with prescribed medications to achieve LDL <78m78mDL >40mg81m Expected Outcomes  Short Term: Participant states understanding of desired cholesterol values and is compliant with medications prescribed. Participant is following exercise prescription and nutrition guidelines.;Long Term: Cholesterol controlled with medications as prescribed, with individualized exercise RX and with personalized nutrition plan. Value goals: LDL < 78mg,21m > 40 mg.       Core Components/Risk Factors/Patient Goals Review:  Goals and Risk Factor Review    Row Name 06/28/18 1203             Core Components/Risk Factors/Patient Goals Review   Personal Goals Review  Weight Management/Obesity;Heart Failure;Hypertension;Improve shortness of breath with ADL's;Lipids       Review  Her weight has been holding steady around 107-108lbs.  She is working on her diet and trying to eat good.  She has not had any signs of heart failure and overall her shortness of breath is getting better.  She is  doing well with her blood pressure  and she has been checking them at home.         Expected Outcomes  Short: Continue to work on weight maintenance and not lose.  Long: Continue to monitor risk factors.           Core Components/Risk Factors/Patient Goals at Discharge (Final Review):  Goals and Risk Factor Review - 06/28/18 1203      Core Components/Risk Factors/Patient Goals Review    Personal Goals Review  Weight Management/Obesity;Heart Failure;Hypertension;Improve shortness of breath with ADL's;Lipids    Review  Her weight has been holding steady around 107-108lbs.  She is working on her diet and trying to eat good.  She has not had any signs of heart failure and overall her shortness of breath is getting better.  She is doing well with her blood pressure  and she has been checking them at home.      Expected Outcomes  Short: Continue to work on weight maintenance and not lose.  Long: Continue to monitor risk factors.        ITP Comments: ITP Comments    Row Name 06/11/18 1236 07/08/18 0851 07/23/18 1533 08/05/18 0857     ITP Comments  Medical Evaluation completed. Chart sent for review and changes to Dr. Emily Filbert Director of [place]. Diagnosis can be found in [facility] encounter 04/19/18  30 day review completed. ITP sent to Dr. Emily Filbert Director of [place]. Continue with ITP unless changes are made by physician.  [name] had scheduled eye surgery on 11/22.  Just waiting for clearance to return post surgery.   30 day review completed. ITP sent to Dr. Emily Filbert Director of [place]. Continue with ITP unless changes are made by physician.       Comments: 30 day review

## 2018-08-05 NOTE — Progress Notes (Signed)
Daily Session Note  Patient Details  Name: Jeanne Pittman MRN: [id number] Date of Birth: [birth year] Referring Provider:     Pulmonary Rehab from 06/11/2018 in [facility] Cardiac and Pulmonary Rehab  Referring Provider  Kathlyn Sacramento MD      Encounter Date: 08/05/2018  Check In:      Social History   Tobacco Use  Smoking Status Former Smoker  . Types: Cigarettes  . Last attempt to quit: 1950  . Years since quitting: 70.0  Smokeless Tobacco Never Used    Goals Met:  Independence with exercise equipment Exercise tolerated well No report of cardiac concerns or symptoms Strength training completed today  Goals Unmet:  Not Applicable  Comments: Pt able to follow exercise prescription today without complaint.  Will continue to monitor for progression.    Dr. Emily Filbert is Medical Director for [place] and [facility].

## 2018-08-07 ENCOUNTER — Encounter: Payer: Medicare Other | Admitting: *Deleted

## 2018-08-07 DIAGNOSIS — I5022 Chronic systolic (congestive) heart failure: Secondary | ICD-10-CM

## 2018-08-07 NOTE — Progress Notes (Signed)
Daily Session Note  Patient Details  Name: Jeanne Pittman MRN: [id number] Date of Birth: 06-Feb-1935 Referring Provider:     Pulmonary Rehab from 06/11/2018 in [facility] Cardiac and Pulmonary Rehab  Referring Provider  Kathlyn Sacramento MD      Encounter Date: 08/07/2018  Check In: Session Check In - 08/07/18 1124      Check-In   Supervising physician immediately available to respond to emergencies  [facility] immediately available ER MD    Physician(s)  Drs. Alyse Low    Location  [name]-Cardiac & Pulmonary Rehab    Staff Present  Alberteen Sam, MA, RCEP, CCRP, Exercise Physiologist;Joseph Alcus Dad, RN BSN    Medication changes reported      No    Fall or balance concerns reported     No    Warm-up and Cool-down  Performed as group-led Higher education careers adviser Performed  Yes    VAD Patient?  No    PAD/SET Patient?  No      Pain Assessment   Currently in Pain?  No/denies          Social History   Tobacco Use  Smoking Status Former Smoker  . Types: Cigarettes  . Last attempt to quit: 1950  . Years since quitting: 70.0  Smokeless Tobacco Never Used    Goals Met:  Proper associated with RPD/PD & O2 Sat Independence with exercise equipment Using PLB without cueing & demonstrates good technique Exercise tolerated well No report of cardiac concerns or symptoms Strength training completed today  Goals Unmet:  Not Applicable  Comments: Pt able to follow exercise prescription today without complaint.  Will continue to monitor for progression.    Dr. Emily Filbert is Medical Director for [place] and [facility].

## 2018-08-16 ENCOUNTER — Encounter: Payer: Medicare Other | Admitting: *Deleted

## 2018-08-16 DIAGNOSIS — I5022 Chronic systolic (congestive) heart failure: Secondary | ICD-10-CM | POA: Diagnosis not present

## 2018-08-16 NOTE — Progress Notes (Signed)
Daily Session Note  Patient Details  Name: Jeanne Pittman MRN: [id number] Date of Birth: 11/18/1934 Referring Provider:     Pulmonary Rehab from 06/11/2018 in [name] Cardiac and Pulmonary Rehab  Referring Provider  Arida, Muhammad MD      Encounter Date: 08/16/2018  Check In: Session Check In - 08/16/18 1116      Check-In   Supervising physician immediately available to respond to emergencies  [facility] immediately available ER MD    Physician(s)  Drs. Paduchowski and Seat    Location  [name]-Cardiac & Pulmonary Rehab    Staff Present  Laureen Brown, BS, RRT, Respiratory Therapist;Jessica Hawkins, MA, RCEP, CCRP, Exercise Physiologist;Meredith Craven, RN BSN    Medication changes reported      No    Fall or balance concerns reported     No    Warm-up and Cool-down  Performed as group-led instruction    Resistance Training Performed  Yes    VAD Patient?  No    PAD/SET Patient?  No      Pain Assessment   Currently in Pain?  No/denies          Social History   Tobacco Use  Smoking Status Former Smoker  . Types: Cigarettes  . Last attempt to quit: 1950  . Years since quitting: 70.0  Smokeless Tobacco Never Used    Goals Met:  Proper associated with RPD/PD & O2 Sat Independence with exercise equipment Using PLB without cueing & demonstrates good technique Exercise tolerated well Personal goals reviewed No report of cardiac concerns or symptoms Strength training completed today  Goals Unmet:  Not Applicable  Comments: Pt able to follow exercise prescription today without complaint.  Will continue to monitor for progression.    Dr. Mark Miller is Medical Director for [facility] and [facility]. 

## 2018-08-19 ENCOUNTER — Encounter: Payer: Medicare Other | Admitting: *Deleted

## 2018-08-19 DIAGNOSIS — I5022 Chronic systolic (congestive) heart failure: Secondary | ICD-10-CM

## 2018-08-19 NOTE — Progress Notes (Signed)
Daily Session Note  Patient Details  Name: Jeanne Pittman MRN: [id number] Date of Birth: 12/09/1934 Referring Provider:     Pulmonary Rehab from 06/11/2018 in [facility] Cardiac and Pulmonary Rehab  Referring Provider  Kathlyn Sacramento MD      Encounter Date: 08/19/2018  Check In: Session Check In - 08/19/18 1153      Check-In   Supervising physician immediately available to respond to emergencies  [facility] immediately available ER MD    Physician(s)  Dr. Cinda Quest and Quentin Cornwall    Location  [name]-Cardiac & Pulmonary Rehab    Staff Present  Jasper Loser BS, Exercise Physiologist;Joseph Alcus Dad, RN BSN    Medication changes reported      No    Fall or balance concerns reported     No    Warm-up and Cool-down  Performed as group-led Higher education careers adviser Performed  Yes    VAD Patient?  No    PAD/SET Patient?  No      Pain Assessment   Currently in Pain?  No/denies          Social History   Tobacco Use  Smoking Status Former Smoker  . Types: Cigarettes  . Last attempt to quit: 1950  . Years since quitting: 70.0  Smokeless Tobacco Never Used    Goals Met:  Proper associated with RPD/PD & O2 Sat Independence with exercise equipment Using PLB without cueing & demonstrates good technique Exercise tolerated well No report of cardiac concerns or symptoms Strength training completed today  Goals Unmet:  Not Applicable  Comments: Pt able to follow exercise prescription today without complaint.  Will continue to monitor for progression.    Dr. Emily Filbert is Medical Director for [place] and [facility].

## 2018-08-23 ENCOUNTER — Encounter: Payer: Medicare Other | Attending: Cardiovascular Disease

## 2018-08-23 DIAGNOSIS — R0602 Shortness of breath: Secondary | ICD-10-CM | POA: Insufficient documentation

## 2018-08-23 DIAGNOSIS — I5022 Chronic systolic (congestive) heart failure: Secondary | ICD-10-CM | POA: Insufficient documentation

## 2018-08-23 NOTE — Progress Notes (Signed)
Daily Session Note  Patient Details  Name: Jeanne Pittman MRN: [id number] Date of Birth: 09/13/1934 Referring Provider:     Pulmonary Rehab from 06/11/2018 in [facility] Cardiac and Pul[facility] Rehab  Referring Provider  Kathlyn Sacramento MD      Encounter Date: 08/23/2018  Check In: Session Check In - 08/23/18 1146      Check-In   Supervising physician immediately available to respond to emergencies  [facility] immediately available ER MD    Physician(s)  Dr. Cinda Quest and Joni Fears    Location  [name]-Cardiac & Pulmonary Rehab    Staff Present  Nyoka Cowden, RN, BSN, MA;Meredith Sherryll Burger, RN BSN;Joseph Tessie Fass RCP,RRT,BSRT    Medication changes reported      No    Fall or balance concerns reported     No    Warm-up and Cool-down  Performed as group-led Higher education careers adviser Performed  Yes    VAD Patient?  No    PAD/SET Patient?  No      Pain Assessment   Currently in Pain?  No/denies          Social History   Tobacco Use  Smoking Status Former Smoker  . Types: Cigarettes  . Last attempt to quit: 1950  . Years since quitting: 70.0  Smokeless Tobacco Never Used    Goals Met:  Independence with exercise equipment Exercise tolerated well No report of cardiac concerns or symptoms Strength training completed today  Goals Unmet:  Not Applicable  Comments: Pt able to follow exercise prescription today without complaint.  Will continue to monitor for progression.    Dr. Emily Filbert is Medical Director for [place] and [facility].

## 2018-08-26 DIAGNOSIS — I5022 Chronic systolic (congestive) heart failure: Secondary | ICD-10-CM | POA: Diagnosis not present

## 2018-08-26 NOTE — Progress Notes (Signed)
Daily Session Note  Patient Details  Name: Jeanne Pittman MRN: [id number] Date of Birth: 1935-05-06 Referring Provider:     Pulmonary Rehab from 06/11/2018 in [facility] Cardiac and Pulmonary Rehab  Referring Provider  Kathlyn Sacramento MD      Encounter Date: 08/26/2018  Check In:      Social History   Tobacco Use  Smoking Status Former Smoker  . Types: Cigarettes  . Last attempt to quit: 1950  . Years since quitting: 70.0  Smokeless Tobacco Never Used    Goals Met:  Independence with exercise equipment Exercise tolerated well No report of cardiac concerns or symptoms Strength training completed today  Goals Unmet:  Not Applicable  Comments: Pt able to follow exercise prescription today without complaint.  Will continue to monitor for progression.    Dr. Emily Filbert is Medical Director for [place] and [facility].

## 2018-08-28 ENCOUNTER — Encounter: Payer: Medicare Other | Admitting: *Deleted

## 2018-08-28 DIAGNOSIS — I5022 Chronic systolic (congestive) heart failure: Secondary | ICD-10-CM

## 2018-08-28 NOTE — Progress Notes (Signed)
Daily Session Note  Patient Details  Name: Jeanne Pittman MRN: [id number] Date of Birth: [birth year] Referring Provider:     Pulmonary Rehab from 06/11/2018 in [facility] Cardiac and Pulmonary Rehab  Referring Provider  Kathlyn Sacramento MD      Encounter Date: 08/28/2018  Check In: Session Check In - 08/28/18 1123      Check-In   Supervising physician immediately available to respond to emergencies  [facility] immediately available ER MD    Physician(s)  Drs. Joycelyn Rua    Location  [name]-Cardiac & Pulmonary Rehab    Staff Present  Renita Papa, RN BSN;Shirel Mallis Luan Pulling, MA, RCEP, CCRP, Exercise Physiologist;Joseph Tessie Fass RCP,RRT,BSRT    Medication changes reported      No    Fall or balance concerns reported     No    Warm-up and Cool-down  Performed as group-led instruction    Resistance Training Performed  Yes    VAD Patient?  No    PAD/SET Patient?  No      Pain Assessment   Currently in Pain?  No/denies          Social History   Tobacco Use  Smoking Status Former Smoker  . Types: Cigarettes  . Last attempt to quit: 1950  . Years since quitting: 70.0  Smokeless Tobacco Never Used    Goals Met:  Proper associated with RPD/PD & O2 Sat Independence with exercise equipment Using PLB without cueing & demonstrates good technique Exercise tolerated well No report of cardiac concerns or symptoms Strength training completed today  Goals Unmet:  Not Applicable  Comments: Pt able to follow exercise prescription today without complaint.  Will continue to monitor for progression.    Dr. Emily Filbert is Medical Director for [place] and [facility].

## 2018-08-30 ENCOUNTER — Encounter: Payer: Medicare Other | Admitting: *Deleted

## 2018-08-30 DIAGNOSIS — I5022 Chronic systolic (congestive) heart failure: Secondary | ICD-10-CM

## 2018-08-30 NOTE — Progress Notes (Signed)
Daily Session Note  Patient Details  Name: Jeanne Pittman MRN: [id number] Date of Birth: 22-May-1935 Referring Provider:     Pulmonary Rehab from 06/11/2018 in [facility]iac and Pulmonary Rehab  Referring Provider  Kathlyn Sacramento MD      Encounter Date: 08/30/2018  Check In: Session Check In - 08/30/18 1125      Check-In   Supervising physician immediately available to respond to emergencies  [facility] immediately available ER MD    Physician(s)  Drs. Forestine Na    Location  [name]-Cardiac & Pulmonary Rehab    Staff Present  Renita Papa, RN BSN;Wynn Alldredge Luan Pulling, MA, RCEP, CCRP, Exercise Physiologist;Joseph Tessie Fass RCP,RRT,BSRT    Medication changes reported      No    Fall or balance concerns reported     No    Warm-up and Cool-down  Performed as group-led instruction    Resistance Training Performed  Yes    VAD Patient?  No    PAD/SET Patient?  No      Pain Assessment   Currently in Pain?  No/denies          Social History   Tobacco Use  Smoking Status Former Smoker  . Types: Cigarettes  . Last attempt to quit: 1950  . Years since quitting: 70.0  Smokeless Tobacco Never Used    Goals Met:  Proper associated with RPD/PD & O2 Sat Independence with exercise equipment Using PLB without cueing & demonstrates good technique Exercise tolerated well No report of cardiac concerns or symptoms Strength training completed today  Goals Unmet:  Not Applicable  Comments: Pt able to follow exercise prescription today without complaint.  Will continue to monitor for progression.    Dr. Emily Filbert is Medical Director for [place] and [facility].

## 2018-09-02 DIAGNOSIS — I5022 Chronic systolic (congestive) heart failure: Secondary | ICD-10-CM

## 2018-09-02 NOTE — Progress Notes (Signed)
Pulmonary Individual Treatment Plan  Patient Details  Name: Jeanne Pittman MRN: [id number] Date of Birth: 09/22/1934 Referring Provider:     Pulmonary Rehab from 06/11/2018 in [facility] Cardiac and Pulmonary Rehab  Referring Provider  Kathlyn Sacramento MD      Initial Encounter Date:    Pulmonary Rehab from 06/11/2018 in [facility] Cardiac and Pulmonary Rehab  Date  06/11/18      Visit Diagnosis: Chronic systolic heart failure ([place])  Patient's Home Medications on Admission:  Current Outpatient Medications:  .  acetaminophen (TYLENOL) 650 MG CR tablet, Take 650 mg by mouth every 8 (eight) hours as needed for pain., Disp: , Rfl:  .  aspirin 81 MG tablet, Take 81 mg by mouth every morning. , Disp: , Rfl:  .  carvedilol (COREG) 3.125 MG tablet, TAKE 1 TABLET BY MOUTH TWICE DAILY WITH A MEAL, Disp: 60 tablet, Rfl: 6 .  Cholecalciferol (VITAMIN D-3) 5000 UNITS TABS, Take 2,000 Int'l Units by mouth every morning. , Disp: , Rfl:  .  fexofenadine (ALLEGRA) 180 MG tablet, Take 180 mg by mouth daily as needed for rhinitis. , Disp: , Rfl:  .  Fluocinolone Acetonide Scalp 0.01 % OIL, as needed., Disp: , Rfl:  .  furosemide (LASIX) 20 MG tablet, Take 1 tablet (20 mg total) by mouth daily. (Patient taking differently: Take 20 mg by mouth daily as needed. ), Disp: 30 tablet, Rfl: 6 .  gabapentin (NEURONTIN) 300 MG capsule, Take 300 mg by mouth as needed., Disp: , Rfl:  .  latanoprost (XALATAN) 0.005 % ophthalmic solution, Place 1 drop into both eyes at bedtime. , Disp: , Rfl:  .  LORazepam (ATIVAN) 0.5 MG tablet, Take 0.5 mg by mouth every 8 (eight) hours as needed for anxiety. , Disp: , Rfl:  .  losartan (COZAAR) 25 MG tablet, TAKE 1/2 TABLET(12.5 MG) BY MOUTH DAILY AFTER BREAKFAST (Patient taking differently: Take 25 mg by mouth daily. ), Disp: 15 tablet, Rfl: 8 .  mupirocin ointment (BACTROBAN) 2 %, Apply topically as needed., Disp: , Rfl:  .  oxybutynin (DITROPAN-XL) 10 MG 24 hr tablet, Take 1 tablet (10 mg  total) by mouth daily. (Patient not taking: Reported on 06/11/2018), Disp: 30 tablet, Rfl: 3 .  pantoprazole (PROTONIX) 40 MG tablet, TAKE 1 TABLET(40 MG) BY MOUTH TWICE DAILY, Disp: 60 tablet, Rfl: 3 .  rosuvastatin (CRESTOR) 10 MG tablet, TAKE 1 TABLET(10 MG) BY MOUTH EVERY OTHER DAY, Disp: 45 tablet, Rfl: 2 .  timolol (TIMOPTIC) 0.5 % ophthalmic solution, Place 1 drop into both eyes 2 (two) times daily. , Disp: , Rfl:  .  triamcinolone ointment (KENALOG) 0.1 %, as needed., Disp: , Rfl:   Past Medical History: Past Medical History:  Diagnosis Date  . A-fib ([place])    07/2012: A. fib with RVR in the setting of myocardial infarction. Converted to sinus rhythm with amiodarone. No recurrence.  . Anxiety   . Atrophic vaginitis   . Chronic combined systolic (congestive) and diastolic (congestive) heart failure ([place])    a. EF was 25-35% post MI but improved to 45-50% in 04/2013; b. 03/2017 Echo: EF 40-45%, mod focal/basal hypertrophy of septum. Sev mid-apicalanteroseptal, ant, and apical HK. Gr1 DD, mild AI/MR, mod TR, PASP 20mg.  . Colon adenomas   . Coronary artery disease    a. 100/8676STEMI complicated by cardiogenic shock. Cath/PCI: LAD [id number]), RCA 95p(DES). EF 25%; b. 03/2017 MV: EF 57%, fixed apical, periapical, mid to distal anteroseptal defect. No ischemia.  .[name]  Dysrhythmia    A-fib  . Fibrocystic breast disease   . GERD (gastroesophageal reflux disease)   . Glaucoma   . History of gastritis   . Hyperlipidemia   . Hypertension   . Insomnia   . Ischemic cardiomyopathy    a. 07/2012 EF 25-35% following MI-->Improved to 45-50%;  b. 03/2017 Echo: EF 40-45%.  . Myocardial infarction ([place]) 08/18/12  . Non Hodgkin's lymphoma ([place]) 2005   reoccurance 2007 and 2012  . Osteoarthritis   . Polyneuropathy   . Polyposis of colon   . Restless leg syndrome   . Urinary, incontinence, stress female     Tobacco Use: Social History   Tobacco Use  Smoking Status Former Smoker  . Types:  Cigarettes  . Last attempt to quit: 1950  . Years since quitting: 70.0  Smokeless Tobacco Never Used    Labs: Recent Review Scientist, physiological    Labs for ITP Cardiac and Pulmonary Rehab Latest Ref Rng & Units 04/07/2013 09/02/2015   Cholestrol 100 - 199 mg/dL 136 172   LDLCALC 0 - 99 mg/dL 58 99   HDL >39 mg/dL 54 51   Trlycerides 0 - 149 mg/dL 120 111       Pulmonary Assessment Scores: Pulmonary Assessment Scores    Row Name 06/11/18 1238         ADL [facility]   ADL Phase  Entry     SOB Score total  75     Rest  1     Walk  2     Stairs  3     Bath  3     Dress  3     Shop  4       CAT Score   CAT Score  22       mMRC Score   mMRC Score  2        Pulmonary Function Assessment:   Exercise Target Goals: Exercise Program Goal: Individual exercise prescription set using results from initial 6 min walk test and THRR while considering  patient's activity barriers and safety.   Exercise Prescription Goal: Initial exercise prescription builds to 30-45 minutes a day of aerobic activity, 2-3 days per week.  Home exercise guidelines will be given to patient during program as part of exercise prescription that the participant will acknowledge.  Activity Barriers & Risk Stratification: Activity Barriers & Cardiac Risk Stratification - 06/11/18 1320      Activity Barriers & Cardiac Risk Stratification   Activity Barriers  Decreased Ventricular Function;Deconditioning;Muscular Weakness;Shortness of Breath       6 Minute Walk: 6 Minute Walk    Row Name 06/11/18 1314         6 Minute Walk   Phase  Initial     Distance  1170 feet     Walk Time  6 minutes     # of Rest Breaks  0     MPH  2.22     METS  2.04     RPE  11     Perceived Dyspnea   1     VO2 Peak  7.15     Symptoms  No     Resting HR  68 bpm     Resting BP  122/70     Resting Oxygen Saturation   99 %     Exercise Oxygen Saturation  during 6 min walk  94 %     Max Ex. HR  86 bpm  Max Ex. BP  124/66      2 Minute Post BP  128/72       Interval HR   1 Minute HR  76     2 Minute HR  83     3 Minute HR  82     4 Minute HR  84     5 Minute HR  84     6 Minute HR  86     2 Minute Post HR  75     Interval Heart Rate?  Yes       Interval Oxygen   Interval Oxygen?  Yes     Baseline Oxygen Saturation %  99 %     1 Minute Oxygen Saturation %  95 %     1 Minute Liters of Oxygen  0 L Room Air     2 Minute Oxygen Saturation %  95 %     2 Minute Liters of Oxygen  0 L     3 Minute Oxygen Saturation %  94 %     3 Minute Liters of Oxygen  0 L     4 Minute Oxygen Saturation %  95 %     4 Minute Liters of Oxygen  0 L     5 Minute Oxygen Saturation %  94 %     5 Minute Liters of Oxygen  0 L     6 Minute Oxygen Saturation %  95 %     6 Minute Liters of Oxygen  0 L     2 Minute Post Oxygen Saturation %  98 %     2 Minute Post Liters of Oxygen  0 L       Oxygen Initial Assessment: Oxygen Initial Assessment - 06/11/18 1250      Home Oxygen   Home Oxygen Device  None    Sleep Oxygen Prescription  None    Home Exercise Oxygen Prescription  None    Home at Rest Exercise Oxygen Prescription  None      Initial 6 min Walk   Oxygen Used  None      Program Oxygen Prescription   Program Oxygen Prescription  None      Intervention   Short Term Goals  To learn and understand importance of monitoring SPO2 with pulse oximeter and demonstrate accurate use of the pulse oximeter.;To learn and understand importance of maintaining oxygen saturations>88%;To learn and demonstrate proper pursed lip breathing techniques or other breathing techniques.    Long  Term Goals  Verbalizes importance of monitoring SPO2 with pulse oximeter and return demonstration;Maintenance of O2 saturations>88%;Exhibits proper breathing techniques, such as pursed lip breathing or other method taught during program session       Oxygen Re-Evaluation: Oxygen Re-Evaluation    Row Name 06/14/18 1127 06/17/18 1628 06/28/18 1205  08/16/18 1155       Program Oxygen Prescription   Program Oxygen Prescription  -  -  None  None      Home Oxygen   Home Oxygen Device  -  -  None  None    Sleep Oxygen Prescription  -  -  None  None    Home Exercise Oxygen Prescription  -  -  None  None    Home at Rest Exercise Oxygen Prescription  -  -  None  None      Goals/Expected Outcomes   Short Term Goals  -  To learn and understand importance of  monitoring SPO2 with pulse oximeter and demonstrate accurate use of the pulse oximeter.;To learn and understand importance of maintaining oxygen saturations>88%;To learn and demonstrate proper pursed lip breathing techniques or other breathing techniques.  To learn and understand importance of monitoring SPO2 with pulse oximeter and demonstrate accurate use of the pulse oximeter.;To learn and understand importance of maintaining oxygen saturations>88%;To learn and demonstrate proper pursed lip breathing techniques or other breathing techniques.  To learn and understand importance of monitoring SPO2 with pulse oximeter and demonstrate accurate use of the pulse oximeter.;To learn and understand importance of maintaining oxygen saturations>88%;To learn and demonstrate proper pursed lip breathing techniques or other breathing techniques.    Long  Term Goals  -  Verbalizes importance of monitoring SPO2 with pulse oximeter and return demonstration;Maintenance of O2 saturations>88%;Exhibits proper breathing techniques, such as pursed lip breathing or other method taught during program session  Verbalizes importance of monitoring SPO2 with pulse oximeter and return demonstration;Maintenance of O2 saturations>88%;Exhibits proper breathing techniques, such as pursed lip breathing or other method taught during program session  Verbalizes importance of monitoring SPO2 with pulse oximeter and return demonstration;Maintenance of O2 saturations>88%;Exhibits proper breathing techniques, such as pursed lip breathing  or other method taught during program session    Comments  -  Reviewed PLB technique with pt.  Talked about how it work and it's important to maintaining his exercise saturations.    Claritza is off to a good start in rehab.  She is doing better with her breathing.  She has not gotten a pulse oximeter yet, but is still planning to.   She has been doing well with her PLB and finds it helpful when she is SOB.  She will sit down and use her PLB to recover.    Kaysha is doing well in rehab.  She uses her PLB routinely and has found it to be helpful when she wakes at night to help her get back to sleep. She has not gotten a new pulse oximeter yet.      Goals/Expected Outcomes  -  Short: Become more profiecient at using PLB.   Long: Become independent at using PLB.  Short: Get pulse oximeter  Long: Continue to work on using PLB.   Short: Get pulse oximeter.  Long: Continue to use PLB routinely.        Oxygen Discharge (Final Oxygen Re-Evaluation): Oxygen Re-Evaluation - 08/16/18 1155      Program Oxygen Prescription   Program Oxygen Prescription  None      Home Oxygen   Home Oxygen Device  None    Sleep Oxygen Prescription  None    Home Exercise Oxygen Prescription  None    Home at Rest Exercise Oxygen Prescription  None      Goals/Expected Outcomes   Short Term Goals  To learn and understand importance of monitoring SPO2 with pulse oximeter and demonstrate accurate use of the pulse oximeter.;To learn and understand importance of maintaining oxygen saturations>88%;To learn and demonstrate proper pursed lip breathing techniques or other breathing techniques.    Long  Term Goals  Verbalizes importance of monitoring SPO2 with pulse oximeter and return demonstration;Maintenance of O2 saturations>88%;Exhibits proper breathing techniques, such as pursed lip breathing or other method taught during program session    Comments  Carleen is doing well in rehab.  She uses her PLB routinely and has found it to be helpful  when she wakes at night to help her get back to sleep. She has not gotten  a new pulse oximeter yet.      Goals/Expected Outcomes  Short: Get pulse oximeter.  Long: Continue to use PLB routinely.        Initial Exercise Prescription: Initial Exercise Prescription - 06/11/18 1400      Date of Initial Exercise RX and Referring Provider   Date  06/11/18    Referring Provider  Kathlyn Sacramento MD      Treadmill   MPH  2.2    Grade  0.5    Minutes  15    METs  2.84      NuStep   Level  4    SPM  80    Minutes  15    METs  2.2      REL-XR   Level  2    Speed  50    Minutes  15    METs  2.2      Prescription Details   Frequency (times per week)  3    Duration  Progress to 45 minutes of aerobic exercise without signs/symptoms of physical distress      Intensity   THRR 40-80% of Max Heartrate  96-125    Ratings of Perceived Exertion  11-13    Perceived Dyspnea  0-4      Progression   Progression  Continue to progress workloads to maintain intensity without signs/symptoms of physical distress.      Resistance Training   Training Prescription  Yes    Weight  3 lbs    Reps  10-15       Perform Capillary Blood Glucose checks as needed.  Exercise Prescription Changes: Exercise Prescription Changes    Row Name 06/11/18 1400 06/25/18 1600 06/26/18 1200 07/10/18 1500 08/06/18 1300     Response to Exercise   Blood Pressure (Admit)  122/70  124/60  -  128/78  122/64   Blood Pressure (Exercise)  124/66  134/64  -  -  -   Blood Pressure (Exit)  128/72  110/62  -  100/54  122/80   Heart Rate (Admit)  68 bpm  109 bpm  -  82 bpm  92 bpm   Heart Rate (Exercise)  86 bpm  100 bpm  -  99 bpm  94 bpm   Heart Rate (Exit)  75 bpm  77 bpm  -  73 bpm  71 bpm   Oxygen Saturation (Admit)  99 %  92 %  -  100 %  99 %   Oxygen Saturation (Exercise)  94 %  95 %  -  93 %  91 %   Oxygen Saturation (Exit)  98 %  98 %  -  98 %  93 %   Rating of Perceived Exertion (Exercise)  11  13  -  15  13    Perceived Dyspnea (Exercise)  1  1  -  2  0   Symptoms  none  none  -  none  none   Comments  walk test results  -  -  -  -   Duration  -  Continue with 45 min of aerobic exercise without signs/symptoms of physical distress.  -  Continue with 45 min of aerobic exercise without signs/symptoms of physical distress.  Continue with 45 min of aerobic exercise without signs/symptoms of physical distress.   Intensity  -  THRR unchanged  -  THRR unchanged  THRR unchanged     Progression   Progression  -  Continue to progress workloads to maintain intensity without signs/symptoms of physical distress.  -  Continue to progress workloads to maintain intensity without signs/symptoms of physical distress.  Continue to progress workloads to maintain intensity without signs/symptoms of physical distress.   Average METs  -  3.61  -  2.94  3.11     Resistance Training   Training Prescription  -  Yes  -  Yes  Yes   Weight  -  3 lbs  -  3 lbs  3 lbs   Reps  -  10-15  -  10-15  10-15     Interval Training   Interval Training  -  No  -  No  No     Treadmill   MPH  -  2.2  -  2.2  2.2   Grade  -  0.5  -  0.5  0.5   Minutes  -  15  -  1  15   METs  -  2.84  -  2.84  2.84     NuStep   Level  -  4  -  5  5   Minutes  -  15  -  15  15   METs  -  3  -  2.4  2.2     REL-XR   Level  -  2  -  2  2   Minutes  -  15  -  15  15   METs  -  5  -  3.6  4.3     Home Exercise Plan   Plans to continue exercise at  -  -  Home (comment) walking  Home (comment) walking  Home (comment) walking   Frequency  -  -  Add 1 additional day to program exercise sessions.  Add 1 additional day to program exercise sessions.  Add 1 additional day to program exercise sessions.   Initial Home Exercises Provided  -  -  06/26/18  06/26/18  06/26/18   Row Name 08/22/18 1600             Response to Exercise   Blood Pressure (Admit)  120/68       Blood Pressure (Exit)  116/64       Heart Rate (Admit)  69 bpm       Heart Rate  (Exercise)  83 bpm       Heart Rate (Exit)  68 bpm       Oxygen Saturation (Admit)  97 %       Oxygen Saturation (Exercise)  95 %       Oxygen Saturation (Exit)  98 %       Rating of Perceived Exertion (Exercise)  12       Perceived Dyspnea (Exercise)  1       Symptoms  none       Duration  Continue with 45 min of aerobic exercise without signs/symptoms of physical distress.       Intensity  THRR unchanged         Progression   Progression  Continue to progress workloads to maintain intensity without signs/symptoms of physical distress.       Average METs  2.7         Resistance Training   Training Prescription  Yes       Weight  3 lb       Reps  10-15  Interval Training   Interval Training  No         Treadmill   MPH  2.2       Grade  0.5       Minutes  15       METs  2.84         NuStep   Level  5       SPM  80       Minutes  15       METs  2.2         REL-XR   Level  2       Speed  50       Minutes  15       METs  3.1         Home Exercise Plan   Plans to continue exercise at  Home (comment) walking       Frequency  Add 1 additional day to program exercise sessions.       Initial Home Exercises Provided  06/26/18          Exercise Comments: Exercise Comments    Row Name 06/14/18 1126 06/25/18 1627         Exercise Comments  -  First full day of exercise was 06/17/18 !  Patient was oriented to gym and equipment including functions, settings, policies, and procedures.  Patient's individual exercise prescription and treatment plan were reviewed.  All starting workloads were established based on the results of the 6 minute walk test done at initial orientation visit.  The plan for exercise progression was also introduced and progression will be customized based on patient's performance and goals.         Exercise Goals and Review: Exercise Goals    Row Name 06/11/18 1429             Exercise Goals   Increase Physical Activity  Yes        Intervention  Provide advice, education, support and counseling about physical activity/exercise needs.;Develop an individualized exercise prescription for aerobic and resistive training based on initial evaluation findings, risk stratification, comorbidities and participant's personal goals.       Expected Outcomes  Short Term: Attend rehab on a regular basis to increase amount of physical activity.;Long Term: Add in home exercise to make exercise part of routine and to increase amount of physical activity.;Long Term: Exercising regularly at least 3-5 days a week.       Increase Strength and Stamina  Yes       Intervention  Provide advice, education, support and counseling about physical activity/exercise needs.;Develop an individualized exercise prescription for aerobic and resistive training based on initial evaluation findings, risk stratification, comorbidities and participant's personal goals.       Expected Outcomes  Short Term: Increase workloads from initial exercise prescription for resistance, speed, and METs.;Short Term: Perform resistance training exercises routinely during rehab and add in resistance training at home;Long Term: Improve cardiorespiratory fitness, muscular endurance and strength as measured by increased METs and functional capacity (6MWT)       Able to understand and use rate of perceived exertion (RPE) scale  Yes       Intervention  Provide education and explanation on how to use RPE scale       Expected Outcomes  Short Term: Able to use RPE daily in rehab to express subjective intensity level;Long Term:  Able to use RPE to guide intensity level when exercising independently  Able to understand and use Dyspnea scale  Yes       Intervention  Provide education and explanation on how to use Dyspnea scale       Expected Outcomes  Short Term: Able to use Dyspnea scale daily in rehab to express subjective sense of shortness of breath during exertion;Long Term: Able to use  Dyspnea scale to guide intensity level when exercising independently       Knowledge and understanding of Target Heart Rate Range (THRR)  Yes       Intervention  Provide education and explanation of THRR including how the numbers were predicted and where they are located for reference       Expected Outcomes  Short Term: Able to state/look up THRR;Short Term: Able to use daily as guideline for intensity in rehab;Long Term: Able to use THRR to govern intensity when exercising independently       Able to check pulse independently  Yes       Intervention  Provide education and demonstration on how to check pulse in carotid and radial arteries.;Review the importance of being able to check your own pulse for safety during independent exercise       Expected Outcomes  Short Term: Able to explain why pulse checking is important during independent exercise;Long Term: Able to check pulse independently and accurately       Understanding of Exercise Prescription  Yes       Intervention  Provide education, explanation, and written materials on patient's individual exercise prescription       Expected Outcomes  Short Term: Able to explain program exercise prescription;Long Term: Able to explain home exercise prescription to exercise independently          Exercise Goals Re-Evaluation : Exercise Goals Re-Evaluation    Row Name 06/14/18 1126 06/17/18 1628 06/25/18 1630 06/26/18 1220 06/28/18 1159     Exercise Goal Re-Evaluation   Exercise Goals Review  -  Increase Physical Activity;Increase Strength and Stamina;Understanding of Exercise Prescription;Able to understand and use rate of perceived exertion (RPE) scale;Knowledge and understanding of Target Heart Rate Range (THRR)  Increase Physical Activity;Increase Strength and Stamina;Understanding of Exercise Prescription  Increase Physical Activity;Increase Strength and Stamina;Able to understand and use rate of perceived exertion (RPE) scale;Knowledge and  understanding of Target Heart Rate Range (THRR);Able to check pulse independently;Understanding of Exercise Prescription  Increase Physical Activity;Increase Strength and Stamina;Understanding of Exercise Prescription   Comments  -  Reviewed RPE scale, THR and program prescription with pt today.  Pt voiced understanding and was given a copy of goals to take home.   Donald is off to a good start in rehab.  She has been able to pick up the workloads she had gotten up to in [facility].  She has completed three full days of exercise. We will continue to monitor her progression.   Reviewed home exercise with pt today.  Pt plans to walk at home for exercise.  Reviewed THR, pulse, RPE, sign and symptoms, and when to call 911 or MD.  Also discussed weather considerations and indoor options.  Pt voiced understanding.  Ada is off to a good start in rehab.  We just reviewed home exercise and she is supposed to add it in next week.  She has noticed that exercise has helped increase her strength and stamina.    Expected Outcomes  -  Short: Use RPE daily to regulate intensity. Long: Follow program prescription in THR.  Short: Review home exercise guidelines.  Long: Continue to follow program prescription.   Short: Start to walk more at home.  Long: Continue to exercise more independently.   Short: Start walking at home.  Long: Continue to increase strength and stamina.    [place] Name 07/10/18 1522 07/23/18 1533 08/06/18 1340 08/16/18 1148 08/22/18 1658     Exercise Goal Re-Evaluation   Exercise Goals Review  Increase Physical Activity;Increase Strength and Stamina;Understanding of Exercise Prescription  -  Increase Physical Activity;Increase Strength and Stamina;Understanding of Exercise Prescription  Increase Physical Activity;Increase Strength and Stamina;Understanding of Exercise Prescription  Increase Physical Activity;Increase Strength and Stamina;Able to understand and use rate of perceived exertion (RPE) scale;Able to  understand and use Dyspnea scale;Knowledge and understanding of Target Heart Rate Range (THRR);Understanding of Exercise Prescription   Comments  Diahn has been doing well in rehab.  She will be out at least through next week as she is having eye surgery on Friday.  She now up to level 5 on the NuStep.  We will continue to monitor her progress.   Out since last review  Litisha returned yesterday after being cleared from her eye surgery.  She was able to come back and do her full workout.  We will continue to monitor her progress.   Lynnox is doing well being back in rehab.  She is doing some of her exercise at home, but has not been doing it during the last two weeks with kids being out of school.  She is feeling stronger and getting her stamina built back up.   Sarely has maintained levels of exercise since last review.  She attended 4 sessions in December.  Regular attendance will enhance her progress.   Expected Outcomes  Short: Increase XR.  Long: Continue to walk more at home.   -  Short: Get back into swing of class and then start to increase workloads.  Long; Continue to increase strength and stamina.   Short: Continue to get back into routine of exercise.  Long: Continue to build strength and stamina.   Short - attend 2-3 times per week Long - improve overall MET level      Discharge Exercise Prescription (Final Exercise Prescription Changes): Exercise Prescription Changes - 08/22/18 1600      Response to Exercise   Blood Pressure (Admit)  120/68    Blood Pressure (Exit)  116/64    Heart Rate (Admit)  69 bpm    Heart Rate (Exercise)  83 bpm    Heart Rate (Exit)  68 bpm    Oxygen Saturation (Admit)  97 %    Oxygen Saturation (Exercise)  95 %    Oxygen Saturation (Exit)  98 %    Rating of Perceived Exertion (Exercise)  12    Perceived Dyspnea (Exercise)  1    Symptoms  none    Duration  Continue with 45 min of aerobic exercise without signs/symptoms of physical distress.    Intensity  THRR  unchanged      Progression   Progression  Continue to progress workloads to maintain intensity without signs/symptoms of physical distress.    Average METs  2.7      Resistance Training   Training Prescription  Yes    Weight  3 lb    Reps  10-15      Interval Training   Interval Training  No      Treadmill   MPH  2.2    Grade  0.5    Minutes  15  METs  2.84      NuStep   Level  5    SPM  80    Minutes  15    METs  2.2      REL-XR   Level  2    Speed  50    Minutes  15    METs  3.1      Home Exercise Plan   Plans to continue exercise at  Home (comment)   walking   Frequency  Add 1 additional day to program exercise sessions.    Initial Home Exercises Provided  06/26/18       Nutrition:  Target Goals: Understanding of nutrition guidelines, daily intake of sodium '1500mg'$ , cholesterol '200mg'$ , calories 30% from fat and 7% or less from saturated fats, daily to have 5 or more servings of fruits and vegetables.  Biometrics: Pre Biometrics - 06/11/18 1429      Pre Biometrics   Height  5' (1.524 m)    Weight  108 lb 8 oz (49.2 kg)    Waist Circumference  26 inches    Hip Circumference  35 inches    Waist to Hip Ratio  0.74 %    BMI (Calculated)  21.19    Single Leg Stand  21.44 seconds        Nutrition Therapy Plan and Nutrition Goals: Nutrition Therapy & Goals - 08/19/18 1226      Nutrition Therapy   Diet  DASH    Protein (specify units)  6oz    Fiber  20 grams    Whole Grain Foods  3 servings   does not typically choose whole grains   Saturated Fats  11 max. grams    Fruits and Vegetables  4 servings/day   8 ideal; she has a fair-poor appetite   Sodium  1500 grams      Personal Nutrition Goals   Nutrition Goal  D/t recent wt loss, be consistent about drinking your protein shakes daily, eat snacks between meals more regularly, and choose fuller fat options for items like yogurt or cottage cheese    Personal Goal #2  Continue to limit sodium intake  and to include a variety of protein sources    Comments  Pt reports recent wt loss of 8-10# over the past couple of months r/t decreased appetite and depression s/p friend passing away. UBW 115#. However she feels she is slowly regaining her appetite. She "gave up" salt in 2015 and sometimes misses the flavor that salt brings to food. She eats 3 meals/day, with breakfast being her largest meal and lunch and supper being "light." Breakfast options, egg, bacon, white toast, oatmeal, cereal, grits. Lunch options: [employer] soup occasionally, tuna or chicken salad, vegetables. Dinner options: soup, 1/2 sandwich, vegetables. Snacks: fruit, yogurt, energy bar, protein shake mid morning. Beverages: soda occasionally, water mostly, coffee, hot tea. She does not eat red meat but enjoys other meats like chicken, fish, shrimp regularly. If she buys canned vegetables she rinses them off with water first. She does not eat out often      Intervention Plan   Intervention  Prescribe, educate and counsel regarding individualized specific dietary modifications aiming towards targeted core components such as weight, hypertension, lipid management, diabetes, heart failure and other comorbidities.    Expected Outcomes  Short Term Goal: Understand basic principles of dietary content, such as calories, fat, sodium, cholesterol and nutrients.;Short Term Goal: A plan has been developed with personal nutrition goals set during dietitian  appointment.;Long Term Goal: Adherence to prescribed nutrition plan.       Nutrition Assessments: Nutrition Assessments - 06/11/18 1238      MEDFICTS Scores   Pre Score  35       Nutrition Goals Re-Evaluation: Nutrition Goals Re-Evaluation    [place] Name 08/19/18 1246             Goals   Nutrition Goal  D/t recent wt loss, be consistent about drinking your protein shakes daily, eat snacks between meals more regularly, and choose fuller fat options for items like yogurt or cottage  cheese       Comment  She has lost 8-10# from her UBW r/t depression and lack of appetite, though she feels it is back on the up-swing. She eats small portions particularly for lunch and supper       Expected Outcome  Be consistent about drinking at least 1 protein shake per day, add at least 1 additional snack in each day and look for snacks that contain healthy fats/ that are full-fat rather than non-fat         Personal Goal #2 Re-Evaluation   Personal Goal #2  Continue to limit sodium intake and to include a variety of protein sources          Nutrition Goals Discharge (Final Nutrition Goals Re-Evaluation): Nutrition Goals Re-Evaluation - 08/19/18 1246      Goals   Nutrition Goal  D/t recent wt loss, be consistent about drinking your protein shakes daily, eat snacks between meals more regularly, and choose fuller fat options for items like yogurt or cottage cheese    Comment  She has lost 8-10# from her UBW r/t depression and lack of appetite, though she feels it is back on the up-swing. She eats small portions particularly for lunch and supper    Expected Outcome  Be consistent about drinking at least 1 protein shake per day, add at least 1 additional snack in each day and look for snacks that contain healthy fats/ that are full-fat rather than non-fat      Personal Goal #2 Re-Evaluation   Personal Goal #2  Continue to limit sodium intake and to include a variety of protein sources       Psychosocial: Target Goals: Acknowledge presence or absence of significant depression and/or stress, maximize coping skills, provide positive support system. Participant is able to verbalize types and ability to use techniques and skills needed for reducing stress and depression.   Initial Review & Psychosocial Screening: Initial Psych Review & Screening - 06/11/18 1251      Initial Review   Current issues with  Current Sleep Concerns;Current Stress Concerns    Source of Stress Concerns  Chronic  Illness    Comments  Unable to sleep good every night. Tries not to take her lorazapam at night.  Health continues to be her biggest stressor.  She is able to do things she wants to do, but often needs to stop earlier than she wants.       Family Dynamics   Good Support System?  No   Limited   Comments  Lives by her self.  Son lives in [place].  Relies on neighbors and friends to take care of each other.       Barriers   Psychosocial barriers to participate in program  The patient should benefit from training in stress management and relaxation.;Psychosocial barriers identified (see note)      Screening Interventions  Interventions  Encouraged to exercise;Program counselor consult;Provide feedback about the scores to participant    Expected Outcomes  Short Term goal: Utilizing psychosocial counselor, staff and physician to assist with identification of specific Stressors or current issues interfering with healing process. Setting desired goal for each stressor or current issue identified.;Long Term Goal: Stressors or current issues are controlled or eliminated.;Short Term goal: Identification and review with participant of any Quality of Life or Depression concerns found by scoring the questionnaire.;Long Term goal: The participant improves quality of Life and PHQ9 Scores as seen by post scores and/or verbalization of changes       Quality of Life Scores:  Scores of 19 and below usually indicate a poorer quality of life in these areas.  A difference of  2-3 points is a clinically meaningful difference.  A difference of 2-3 points in the total score of the Quality of Life Index has been associated with significant improvement in overall quality of life, self-image, physical symptoms, and general health in studies assessing change in quality of life.  PHQ-9: Recent Review Flowsheet Data    Depression screen [facility] 2/9 08/19/2018 06/11/2018   Decreased Interest 0 1   Down, Depressed, Hopeless 0 1    PHQ - 2 Score 0 2   Altered sleeping 2 2   Tired, decreased energy 1 2   Change in appetite 1 3   Feeling bad or failure about yourself  0 1   Trouble concentrating 0 1   Moving slowly or fidgety/restless 0 0   Suicidal thoughts 0 0   PHQ-9 Score 4 11   Difficult doing work/chores Not difficult at all Not difficult at all     Interpretation of Total Score  Total Score Depression Severity:  1-4 = Minimal depression, 5-9 = Mild depression, 10-14 = Moderate depression, 15-19 = Moderately severe depression, 20-27 = Severe depression   Psychosocial Evaluation and Intervention: Psychosocial Evaluation - 06/24/18 1225      Psychosocial Evaluation & Interventions   Interventions  Encouraged to exercise with the program and follow exercise prescription    Comments  Counselor met with Ms. Ebbie Latus) today for initial psychosocial evaluation.  She is an 84 year old who has been in the Cardiac rehab and CARE programs and her Dr. recommended this program.  She has a history of lymphoma - now in remission.  She has a good support system with a son and she lives in a retirement community with good friends.  Akayla reports sleeping "okay," with the help of medication at times.  She has a "terrible appetite" and reports this has been going on for quite some time.  Gyneth denies a history of depression or anxiety or any current symptoms and her mood is typically positive.  Her health is her primary stressor at this time.  Shavonte's goal for this program is to "get through it" and to increase her energy.  She will be followed by staff here.     Expected Outcomes  Short:  Celes will benefit from consistent exercise to increase her energy and improve her sleep and appetite.   Long:  Sharda will continue positive life style choices to improve her health.    Continue Psychosocial Services   Follow up required by staff       Psychosocial Re-Evaluation: Psychosocial Re-Evaluation    [place] Name 06/28/18 1200 08/16/18  1151 08/19/18 1507         Psychosocial Re-Evaluation   Current issues with  Current Sleep Concerns;Current Stress Concerns  Current Sleep Concerns;Current Stress Concerns  Current Sleep Concerns;Current Stress Concerns     Comments  Jaedah is off to a good start in rehab.  She has noticed that she sleeps better on the nights that she has rehab. She is struggling with her poor appeptite.  Her doctor is not worried about it yet.  Her health continues to be her biggest stressor.   She has some stress from her neighborhood asking for more money and she is trying to get money to fix her roof.   Miraya is doing well mentally and in a good palce.  She is sleeping better.  She was able to get the roof fixed and insurance is going to help pay.    Reviewed patient health questionnaire (PHQ-9) with patient for follow up. Previously, patients score indicated signs/symptoms of depression.  Reviewed to see if patient is improving symptom wise while in program.  Score improved and patient states that it is because she is not greiving for the death of her friend that she lost a few months ago. She is doing well in the program and has a positive attitude.     Expected Outcomes  Short: Continue to exericse more to sleep better.  Long: Continue to practice self care.   Short: Continue to exercise for sleep.  Long: Continue to take care of self.   Short: Continue to attend [facility] regularly for regular exercise and social engagement. Long: Continue to improve symptoms and manage a positive mental state     Interventions  -  -  Encouraged to attend Pulmonary Rehabilitation for the exercise;Stress management education     Continue Psychosocial Services   -  -  Follow up required by staff        Psychosocial Discharge (Final Psychosocial Re-Evaluation): Psychosocial Re-Evaluation - 08/19/18 1507      Psychosocial Re-Evaluation   Current issues with  Current Sleep Concerns;Current Stress Concerns    Comments  Reviewed  patient health questionnaire (PHQ-9) with patient for follow up. Previously, patients score indicated signs/symptoms of depression.  Reviewed to see if patient is improving symptom wise while in program.  Score improved and patient states that it is because she is not greiving for the death of her friend that she lost a few months ago. She is doing well in the program and has a positive attitude.    Expected Outcomes  Short: Continue to attend [facility] regularly for regular exercise and social engagement. Long: Continue to improve symptoms and manage a positive mental state    Interventions  Encouraged to attend Pulmonary Rehabilitation for the exercise;Stress management education    Continue Psychosocial Services   Follow up required by staff       Education: Education Goals: Education classes will be provided on a weekly basis, covering required topics. Participant will state understanding/return demonstration of topics presented.  Learning Barriers/Preferences: Learning Barriers/Preferences - 06/11/18 1255      Learning Barriers/Preferences   Learning Barriers  Sight   wears glasses   Learning Preferences  None       Education Topics:  Initial Evaluation Education: - Verbal, written and demonstration of respiratory meds, oximetry and breathing techniques. Instruction on use of nebulizers and MDIs and importance of monitoring MDI activations.   Pulmonary Rehab from 08/30/2018 in [facility] Cardiac and Pulmonary Rehab  Date  06/11/18  Educator  [facility]  Instruction Review Code  1- Verbalizes Understanding      General  Nutrition Guidelines/Fats and Fiber: -Group instruction provided by verbal, written material, models and posters to present the general guidelines for heart healthy nutrition. Gives an explanation and review of dietary fats and fiber.   Pulmonary Rehab from 08/30/2018 in [facility] Cardiac and Pulmonary Rehab  Date  06/26/18  Educator  [facility]  Instruction Review Code  1- Verbalizes  Understanding      Controlling Sodium/Reading Food Labels: -Group verbal and written material supporting the discussion of sodium use in heart healthy nutrition. Review and explanation with models, verbal and written materials for utilization of the food label.   Pulmonary Rehab from 08/30/2018 in [facility] Cardiac and Pulmonary Rehab  Date  07/10/18  Educator  [facility]  Instruction Review Code  1- Verbalizes Understanding      Exercise Physiology & General Exercise Guidelines: - Group verbal and written instruction with models to review the exercise physiology of the cardiovascular system and associated critical values. Provides general exercise guidelines with specific guidelines to those with heart or lung disease.    Aerobic Exercise & Resistance Training: - Gives group verbal and written instruction on the various components of exercise. Focuses on aerobic and resistive training programs and the benefits of this training and how to safely progress through these programs.   Pulmonary Rehab from 08/30/2018 in [facility] Cardiac and Pulmonary Rehab  Date  08/16/18  Educator  [facility]   Instruction Review Code  1- Verbalizes Understanding      Flexibility, Balance, Mind/Body Relaxation: Provides group verbal/written instruction on the benefits of flexibility and balance training, including mind/body exercise modes such as yoga, pilates and tai chi.  Demonstration and skill practice provided.   Stress and Anxiety: - Provides group verbal and written instruction about the health risks of elevated stress and causes of high stress.  Discuss the correlation between heart/lung disease and anxiety and treatment options. Review healthy ways to manage with stress and anxiety.   Pulmonary Rehab from 08/30/2018 in [facility] Cardiac and Pulmonary Rehab  Date  07/03/18  Educator  [facility]  Instruction Review Code  1- Verbalizes Understanding      Depression: - Provides group verbal and written instruction on the  correlation between heart/lung disease and depressed mood, treatment options, and the stigmas associated with seeking treatment.   Exercise & Equipment Safety: - Individual verbal instruction and demonstration of equipment use and safety with use of the equipment.   Pulmonary Rehab from 08/30/2018 in [facility] Cardiac and Pulmonary Rehab  Date  06/11/18  Educator  [facility]  Instruction Review Code  1- Verbalizes Understanding      Infection Prevention: - Provides verbal and written material to individual with discussion of infection control including proper hand washing and proper equipment cleaning during exercise session.   Pulmonary Rehab from 08/30/2018 in [facility] Cardiac and Pulmonary Rehab  Date  06/11/18  Educator  [facility]  Instruction Review Code  1- Verbalizes Understanding      Falls Prevention: - Provides verbal and written material to individual with discussion of falls prevention and safety.   Pulmonary Rehab from 08/30/2018 in [facility] Cardiac and Pulmonary Rehab  Date  06/11/18  Educator  [facility]  Instruction Review Code  1- Verbalizes Understanding      Diabetes: - Individual verbal and written instruction to review signs/symptoms of diabetes, desired ranges of glucose level fasting, after meals and with exercise. Advice that pre and post exercise glucose checks will be done for 3 sessions at entry of program.   Chronic Lung Diseases: -  Group verbal and written instruction to review updates, respiratory medications, advancements in procedures and treatments. Discuss use of supplemental oxygen including available portable oxygen systems, continuous and intermittent flow rates, concentrators, personal use and safety guidelines. Review proper use of inhaler and spacers. Provide informative websites for self-education.    Energy Conservation: - Provide group verbal and written instruction for methods to conserve energy, plan and organize activities. Instruct on pacing techniques, use of adaptive  equipment and posture/positioning to relieve shortness of breath.   Pulmonary Rehab from 08/30/2018 in [facility] Cardiac and Pulmonary Rehab  Date  06/19/18  Educator  [facility]  Instruction Review Code  1- Verbalizes Understanding      Triggers and Exacerbations: - Group verbal and written instruction to review types of environmental triggers and ways to prevent exacerbations. Discuss weather changes, air quality and the benefits of nasal washing. Review warning signs and symptoms to help prevent infections. Discuss techniques for effective airway clearance, coughing, and vibrations.   AED/CPR: - Group verbal and written instruction with the use of models to demonstrate the basic use of the AED with the basic ABC's of resuscitation.   Pulmonary Rehab from 08/30/2018 in [facility] Cardiac and Pulmonary Rehab  Date  06/28/18  Educator  [facility]  Instruction Review Code  1- Actuary and Physiology of the Lungs: - Group verbal and written instruction with the use of models to provide basic lung anatomy and physiology related to function, structure and complications of lung disease.   Anatomy & Physiology of the Heart: - Group verbal and written instruction and models provide basic cardiac anatomy and physiology, with the coronary electrical and arterial systems. Review of Valvular disease and Heart Failure   Pulmonary Rehab from 08/30/2018 in [facility] Cardiac and Pulmonary Rehab  Date  08/23/18  Educator  [facility]  Instruction Review Code  1- Verbalizes Understanding      Cardiac Medications: - Group verbal and written instruction to review commonly prescribed medications for heart disease. Reviews the medication, class of the drug, and side effects.   Pulmonary Rehab from 08/30/2018 in [facility] Cardiac and Pulmonary Rehab  Date  08/30/18  Educator  [facility]  Instruction Review Code  1- Verbalizes Understanding      Know Your Numbers and Risk Factors: -Group verbal and written instruction about  important numbers in your health.  Discussion of what are risk factors and how they play a role in the disease process.  Review of Cholesterol, Blood Pressure, Diabetes, and BMI and the role they play in your overall health.   Pulmonary Rehab from 08/30/2018 in [facility] Cardiac and Pulmonary Rehab  Date  08/07/18  Educator  [facility]  Instruction Review Code  1- Verbalizes Understanding      Sleep Hygiene: -Provides group verbal and written instruction about how sleep can affect your health.  Define sleep hygiene, discuss sleep cycles and impact of sleep habits. Review good sleep hygiene tips.    Pulmonary Rehab from 08/30/2018 in [facility] Cardiac and Pulmonary Rehab  Date  08/28/18  Educator  [facility]  Instruction Review Code  1- Verbalizes Understanding      Other: -Provides group and verbal instruction on various topics (see comments)    Knowledge Questionnaire Score: Knowledge Questionnaire Score - 06/11/18 1239      Knowledge Questionnaire Score   Pre Score  15/18   Reviewed with patient       Core Components/Risk Factors/Patient Goals at Admission: Personal Goals and Risk Factors  at Admission - 06/11/18 1255      Core Components/Risk Factors/Patient Goals on Admission    Weight Management  Yes;Weight Maintenance   Feels worse when weight gets too low   Intervention  Weight Management: Develop a combined nutrition and exercise program designed to reach desired caloric intake, while maintaining appropriate intake of nutrient and fiber, sodium and fats, and appropriate energy expenditure required for the weight goal.;Weight Management: Provide education and appropriate resources to help participant work on and attain dietary goals.    Admit Weight  108 lb 8 oz (49.2 kg)    Goal Weight: Short Term  108 lb (49 kg)    Goal Weight: Long Term  108 lb (49 kg)    Expected Outcomes  Short Term: Continue to assess and modify interventions until short term weight is achieved;Long Term: Adherence to  nutrition and physical activity/exercise program aimed toward attainment of established weight goal;Weight Maintenance: Understanding of the daily nutrition guidelines, which includes 25-35% calories from fat, 7% or less cal from saturated fats, less than '200mg'$  cholesterol, less than 1.5gm of sodium, & 5 or more servings of fruits and vegetables daily    Improve shortness of breath with ADL's  Yes    Intervention  Provide education, individualized exercise plan and daily activity instruction to help decrease symptoms of SOB with activities of daily living.    Expected Outcomes  Short Term: Improve cardiorespiratory fitness to achieve a reduction of symptoms when performing ADLs;Long Term: Be able to perform more ADLs without symptoms or delay the onset of symptoms    Heart Failure  Yes    Intervention  Provide a combined exercise and nutrition program that is supplemented with education, support and counseling about heart failure. Directed toward relieving symptoms such as shortness of breath, decreased exercise tolerance, and extremity edema.    Expected Outcomes  Improve functional capacity of life;Short term: Attendance in program 2-3 days a week with increased exercise capacity. Reported lower sodium intake. Reported increased fruit and vegetable intake. Reports medication compliance.;Short term: Daily weights obtained and reported for increase. Utilizing diuretic protocols set by physician.;Long term: Adoption of self-care skills and reduction of barriers for early signs and symptoms recognition and intervention leading to self-care maintenance.    Hypertension  Yes    Intervention  Provide education on lifestyle modifcations including regular physical activity/exercise, weight management, moderate sodium restriction and increased consumption of fresh fruit, vegetables, and low fat dairy, alcohol moderation, and smoking cessation.;Monitor prescription use compliance.    Expected Outcomes  Short Term:  Continued assessment and intervention until BP is < 140/29m HG in hypertensive participants. < 130/839mHG in hypertensive participants with diabetes, heart failure or chronic kidney disease.;Long Term: Maintenance of blood pressure at goal levels.    Lipids  Yes    Intervention  Provide education and support for participant on nutrition & aerobic/resistive exercise along with prescribed medications to achieve LDL '70mg'$ , HDL >'40mg'$ .    Expected Outcomes  Short Term: Participant states understanding of desired cholesterol values and is compliant with medications prescribed. Participant is following exercise prescription and nutrition guidelines.;Long Term: Cholesterol controlled with medications as prescribed, with individualized exercise RX and with personalized nutrition plan. Value goals: LDL < '70mg'$ , HDL > 40 mg.       Core Components/Risk Factors/Patient Goals Review:  Goals and Risk Factor Review    Row Name 06/28/18 1203 08/16/18 1153           Core Components/Risk Factors/Patient Goals Review  Personal Goals Review  Weight Management/Obesity;Heart Failure;Hypertension;Improve shortness of breath with ADL's;Lipids  Weight Management/Obesity;Heart Failure;Hypertension;Improve shortness of breath with ADL's;Lipids      Review  Her weight has been holding steady around 107-108lbs.  She is working on her diet and trying to eat good.  She has not had any signs of heart failure and overall her shortness of breath is getting better.  She is doing well with her blood pressure  and she has been checking them at home.    Abrey has mainly been holding steady with her weight but it did go up a little last week.  She is doing well with her blood pressues and not had any heart failure symptoms.   She feels that her medications are working well and her breathing has improved.       Expected Outcomes  Short: Continue to work on weight maintenance and not lose.  Long: Continue to monitor risk factors.   Short:  Continue to maintain weight.  Long: Continue to monitor heart failure symptoms.          Core Components/Risk Factors/Patient Goals at Discharge (Final Review):  Goals and Risk Factor Review - 08/16/18 1153      Core Components/Risk Factors/Patient Goals Review   Personal Goals Review  Weight Management/Obesity;Heart Failure;Hypertension;Improve shortness of breath with ADL's;Lipids    Review  Jkayla has mainly been holding steady with her weight but it did go up a little last week.  She is doing well with her blood pressues and not had any heart failure symptoms.   She feels that her medications are working well and her breathing has improved.     Expected Outcomes  Short: Continue to maintain weight.  Long: Continue to monitor heart failure symptoms.        ITP Comments: ITP Comments    Row Name 06/11/18 1236 07/08/18 0851 07/23/18 1533 08/05/18 0857 08/16/18 1149   ITP Comments  Medical Evaluation completed. Chart sent for review and changes to Dr. Emily Filbert Director of [place]. Diagnosis can be found in [facility] encounter 04/19/18  30 day review completed. ITP sent to Dr. Emily Filbert Director of [place]. Continue with ITP unless changes are made by physician.  [name] had scheduled eye surgery on 11/22.  Just waiting for clearance to return post surgery.   30 day review completed. ITP sent to Dr. Emily Filbert Director of [place]. Continue with ITP unless changes are made by physician.  Karlina would like to be done by March to go on a trip with her son to [place].    [place] Name 09/02/18 0854           ITP Comments  30 day review completed. ITP sent to Dr. Emily Filbert Director of [place]. Continue with ITP unless changes are made by physician.          Comments: 30 day review

## 2018-09-04 ENCOUNTER — Encounter: Payer: Medicare Other | Admitting: *Deleted

## 2018-09-04 DIAGNOSIS — I5022 Chronic systolic (congestive) heart failure: Secondary | ICD-10-CM

## 2018-09-04 NOTE — Progress Notes (Signed)
Daily Session Note  Patient Details  Name: Jeanne Pittman MRN: [id number] Date of Birth: 04/07/1935 Referring Provider:     Pulmonary Rehab from 06/11/2018 in [name] Cardiac and Pulmonary Rehab  Referring Provider  Arida, Muhammad MD      Encounter Date: 09/04/2018  Check In: Session Check In - 09/04/18 1117      Check-In   Supervising physician immediately available to respond to emergencies  [facility] immediately available ER MD    Physician(s)  Drs. Stafford and Corey    Location  [name]-Cardiac & Pulmonary Rehab    Staff Present  Jessica Hawkins, MA, RCEP, CCRP, Exercise Physiologist;Joseph Hood RCP,RRT,BSRT;Meredith Craven, RN BSN    Medication changes reported      No    Fall or balance concerns reported     No    Warm-up and Cool-down  Performed as group-led instruction    Resistance Training Performed  Yes    VAD Patient?  No    PAD/SET Patient?  No      Pain Assessment   Currently in Pain?  No/denies          Social History   Tobacco Use  Smoking Status Former Smoker  . Types: Cigarettes  . Last attempt to quit: 1950  . Years since quitting: 70.0  Smokeless Tobacco Never Used    Goals Met:  Proper associated with RPD/PD & O2 Sat Independence with exercise equipment Using PLB without cueing & demonstrates good technique Exercise tolerated well No report of cardiac concerns or symptoms Strength training completed today  Goals Unmet:  Not Applicable  Comments: Pt able to follow exercise prescription today without complaint.  Will continue to monitor for progression.    Dr. Mark Miller is Medical Director for [facility] and [facility]. 

## 2018-09-06 ENCOUNTER — Encounter: Payer: Medicare Other | Admitting: *Deleted

## 2018-09-06 DIAGNOSIS — I5022 Chronic systolic (congestive) heart failure: Secondary | ICD-10-CM | POA: Diagnosis not present

## 2018-09-06 NOTE — Progress Notes (Signed)
Daily Session Note  Patient Details  Name: Jeanne Pittman MRN: [id number] Date of Birth: 26-Jan-1935 Referring Provider:     Pulmonary Rehab from 06/11/2018 in [facility] Cardiac and Pulmonary Rehab  Referring Provider  Kathlyn Sacramento MD      Encounter Date: 09/06/2018  Check In: Session Check In - 09/06/18 1142      Check-In   Supervising physician immediately available to respond to emergencies  [facility] immediately available ER MD    Physician(s)  Dr. Corky Downs and Clearnce Hasten    Location  [name]    Staff Present  Renita Papa, RN BSN;Joseph Darrin Nipper, [place], RCEP, CCRP, Exercise Physiologist    Medication changes reported      No    Fall or balance concerns reported     No    Warm-up and Cool-down  Performed as group-led instruction    Resistance Training Performed  Yes    VAD Patient?  No    PAD/SET Patient?  No      Pain Assessment   Currently in Pain?  No/denies          Social History   Tobacco Use  Smoking Status Former Smoker  . Types: Cigarettes  . Last attempt to quit: 1950  . Years since quitting: 70.0  Smokeless Tobacco Never Used    Goals Met:  Proper associated with RPD/PD & O2 Sat Independence with exercise equipment Using PLB without cueing & demonstrates good technique Exercise tolerated well No report of cardiac concerns or symptoms Strength training completed today  Goals Unmet:  Not Applicable  Comments: Pt able to follow exercise prescription today without complaint.  Will continue to monitor for progression.    Dr. Emily Filbert is Medical Director for [place] and [facility].

## 2018-09-09 DIAGNOSIS — I5022 Chronic systolic (congestive) heart failure: Secondary | ICD-10-CM

## 2018-09-09 NOTE — Progress Notes (Signed)
Daily Session Note  Patient Details  Name: Jeanne Pittman MRN: [id number] Date of Birth: 12-Jan-1935 Referring Provider:     Pulmonary Rehab from 06/11/2018 in [facility] Cardiac and Pulmonary Rehab  Referring Provider  Kathlyn Sacramento MD      Encounter Date: 09/09/2018  Check In:      Social History   Tobacco Use  Smoking Status Former Smoker  . Types: Cigarettes  . Last attempt to quit: 1950  . Years since quitting: 70.0  Smokeless Tobacco Never Used    Goals Met:  Proper associated with RPD/PD & O2 Sat Independence with exercise equipment Exercise tolerated well Strength training completed today  Goals Unmet:  Not Applicable  Comments: Pt able to follow exercise prescription today without complaint.  Will continue to monitor for progression.    Dr. Emily Filbert is Medical Director for [place] and [facility].

## 2018-09-11 DIAGNOSIS — I5022 Chronic systolic (congestive) heart failure: Secondary | ICD-10-CM

## 2018-09-11 NOTE — Progress Notes (Signed)
Daily Session Note  Patient Details  Name: Jeanne Pittman MRN: [id number] Date of Birth: May 08, 1935 Referring Provider:     Pulmonary Rehab from 06/11/2018 in [facility] Cardiac and Pulmonary Rehab  Referring Provider  Kathlyn Sacramento MD      Encounter Date: 09/11/2018  Check In: Session Check In - 09/11/18 1150      Check-In   Supervising physician immediately available to respond to emergencies  [facility] immediately available ER MD    Physician(s)  Dr. Mariea Clonts and Archie Balboa    Location  [name] & Pul[facility] Re[facility]    Staff Present  Alberteen Sam, MA, RCEP, CCRP, Exercise Physiologist;Delania Ferg Alcus Dad, RN BSN    Medication changes reported      No    Fall or balance concerns reported     No    Warm-up and Cool-down  Performed as group-led Higher education careers adviser Performed  Yes    VAD Patient?  No    PAD/SET Patient?  No      Pain Assessment   Currently in Pain?  No/denies          Social History   Tobacco Use  Smoking Status Former Smoker  . Types: Cigarettes  . Last attempt to quit: 1950  . Years since quitting: 70.1  Smokeless Tobacco Never Used    Goals Met:  Independence with exercise equipment Exercise tolerated well Personal goals reviewed No report of cardiac concerns or symptoms Strength training completed today  Goals Unmet:  Not Applicable  Comments: Pt able to follow exercise prescription today without complaint.  Will continue to monitor for progression.    Dr. Emily Filbert is Medical Director for [place] and [facility].

## 2018-09-13 ENCOUNTER — Encounter: Payer: Medicare Other | Admitting: *Deleted

## 2018-09-13 DIAGNOSIS — I5022 Chronic systolic (congestive) heart failure: Secondary | ICD-10-CM | POA: Diagnosis not present

## 2018-09-13 NOTE — Progress Notes (Signed)
Daily Session Note  Patient Details  Name: Jeanne Pittman MRN: [id number] Date of Birth: 04-03-1935 Referring Provider:     Pulmonary Rehab from 06/11/2018 in [facility] Cardiac and Pulmonary Rehab  Referring Provider  Kathlyn Sacramento MD      Encounter Date: 09/13/2018  Check In: Session Check In - 09/13/18 1336      Check-In   Supervising physician immediately available to respond to emergencies  See telemetry face sheet for immediately available ER MD    Physician(s)  Dr. Mariea Clonts and Corky Downs    Location  [name]    Staff Present  Renita Papa, RN BSN;Joseph Darrin Nipper, [place], RCEP, CCRP, Exercise Physiologist    Medication changes reported      No    Fall or balance concerns reported     No    Warm-up and Cool-down  Performed as group-led instruction    Resistance Training Performed  Yes    VAD Patient?  No    PAD/SET Patient?  No      Pain Assessment   Currently in Pain?  No/denies          Social History   Tobacco Use  Smoking Status Former Smoker  . Types: Cigarettes  . Last attempt to quit: 1950  . Years since quitting: 70.1  Smokeless Tobacco Never Used    Goals Met:  Proper associated with RPD/PD & O2 Sat Independence with exercise equipment Exercise tolerated well No report of cardiac concerns or symptoms Strength training completed today  Goals Unmet:  Not Applicable  Comments: Pt able to follow exercise prescription today without complaint.  Will continue to monitor for progression.    Dr. Emily Filbert is Medical Director for [place] and [facility].

## 2018-09-16 DIAGNOSIS — I5022 Chronic systolic (congestive) heart failure: Secondary | ICD-10-CM

## 2018-09-16 NOTE — Progress Notes (Signed)
Daily Session Note  Patient Details  Name: Jeanne Pittman MRN: [id number] Date of Birth: [birth year] Referring Provider:     Pulmonary Rehab from 06/11/2018 in [facility] Cardiac and Pulmonary Rehab  Referring Provider  Kathlyn Sacramento MD      Encounter Date: 09/16/2018  Check In: Session Check In - 09/16/18 1347      Check-In   Supervising physician immediately available to respond to emergencies  [facility] immediately available ER MD    Physician(s)  Dr. Jodell Cipro and Quentin Cornwall    Location  [name]    Staff Present  Justin Mend RCP,RRT,BSRT;Amanda Oletta Darter, BA, ACSM CEP, Exercise Physiologist;Kelly Amedeo Plenty, BS, ACSM CEP, Exercise Physiologist    Medication changes reported      No    Fall or balance concerns reported     No    Warm-up and Cool-down  Performed as group-led instruction    Resistance Training Performed  Yes    VAD Patient?  No    PAD/SET Patient?  No      Pain Assessment   Currently in Pain?  No/denies          Social History   Tobacco Use  Smoking Status Former Smoker  . Types: Cigarettes  . Last attempt to quit: 1950  . Years since quitting: 70.1  Smokeless Tobacco Never Used    Goals Met:  Independence with exercise equipment Exercise tolerated well No report of cardiac concerns or symptoms Strength training completed today  Goals Unmet:  Not Applicable  Comments: Pt able to follow exercise prescription today without complaint.  Will continue to monitor for progression.    Dr. Emily Filbert is Medical Director for [place] and [facility].

## 2018-09-18 ENCOUNTER — Encounter: Payer: Medicare Other | Admitting: *Deleted

## 2018-09-18 DIAGNOSIS — I5022 Chronic systolic (congestive) heart failure: Secondary | ICD-10-CM | POA: Diagnosis not present

## 2018-09-18 NOTE — Progress Notes (Signed)
Daily Session Note  Patient Details  Name: Jeanne Pittman MRN: [id number] Date of Birth: 04/03/1935 Referring Provider:     Pulmonary Rehab from 06/11/2018 in [name] Cardiac and Pulmonary Rehab  Referring Provider  Arida, Muhammad MD      Encounter Date: 09/18/2018  Check In: Session Check In - 09/18/18 1139      Check-In   Supervising physician immediately available to respond to emergencies  [facility] immediately available ER MD    Physician(s)  Drs. Slaght and Paduchowski    Location  [name] Pulmonary Re[facility]    Staff Present  Meredith Craven, RN BSN;Jessica Hawkins, MA, RCEP, CCRP, Exercise Physiologist;Joseph Hood RCP,RRT,BSRT    Medication changes reported      No    Fall or balance concerns reported     No    Warm-up and Cool-down  Performed as group-led instruction    Resistance Training Performed  Yes    VAD Patient?  No    PAD/SET Patient?  No      Pain Assessment   Currently in Pain?  No/denies          Social History   Tobacco Use  Smoking Status Former Smoker  . Types: Cigarettes  . Last attempt to quit: 1950  . Years since quitting: 70.1  Smokeless Tobacco Never Used    Goals Met:  Proper associated with RPD/PD & O2 Sat Independence with exercise equipment Using PLB without cueing & demonstrates good technique Exercise tolerated well No report of cardiac concerns or symptoms Strength training completed today  Goals Unmet:  Not Applicable  Comments: Pt able to follow exercise prescription today without complaint.  Will continue to monitor for progression.    Dr. Mark Miller is Medical Director for [facility] and [facility]. 

## 2018-09-20 DIAGNOSIS — I5022 Chronic systolic (congestive) heart failure: Secondary | ICD-10-CM | POA: Diagnosis not present

## 2018-09-20 NOTE — Progress Notes (Signed)
Daily Session Note  Patient Details  Name: Jeanne Pittman MRN: [id number] Date of Birth: Aug 21, 1935 Referring Provider:     Pulmonary Rehab from 06/11/2018 in [facility] Cardiac and Pulmonary Rehab  Referring Provider  Kathlyn Sacramento MD      Encounter Date: 09/20/2018  Check In:      Social History   Tobacco Use  Smoking Status Former Smoker  . Types: Cigarettes  . Last attempt to quit: 1950  . Years since quitting: 70.1  Smokeless Tobacco Never Used    Goals Met:  Proper associated with RPD/PD & O2 Sat Independence with exercise equipment Exercise tolerated well Strength training completed today  Goals Unmet:  Not Applicable  Comments: Pt able to follow exercise prescription today without complaint.  Will continue to monitor for progression.    Dr. Emily Filbert is Medical Director for [place] and [facility].

## 2018-09-23 ENCOUNTER — Encounter: Payer: Medicare Other | Attending: Cardiovascular Disease

## 2018-09-23 DIAGNOSIS — I5022 Chronic systolic (congestive) heart failure: Secondary | ICD-10-CM | POA: Diagnosis present

## 2018-09-23 DIAGNOSIS — R0602 Shortness of breath: Secondary | ICD-10-CM | POA: Diagnosis not present

## 2018-09-23 NOTE — Progress Notes (Signed)
Daily Session Note  Patient Details  Name: Jeanne Pittman MRN: [id number] Date of Birth: [birth year] Referring Provider:     Pulmonary Rehab from 06/11/2018 in [facility] Cardiac and Pulmonary Rehab  Referring Provider  Kathlyn Sacramento MD      Encounter Date: 09/23/2018  Check In:      Social History   Tobacco Use  Smoking Status Former Smoker  . Types: Cigarettes  . Last attempt to quit: 1950  . Years since quitting: 70.1  Smokeless Tobacco Never Used    Goals Met:  Independence with exercise equipment Exercise tolerated well No report of cardiac concerns or symptoms Strength training completed today  Goals Unmet:  Not Applicable  Comments: Pt able to follow exercise prescription today without complaint.  Will continue to monitor for progression.    Dr. Emily Filbert is Medical Director for [place] and [facility].

## 2018-09-25 ENCOUNTER — Encounter: Payer: Medicare Other | Admitting: *Deleted

## 2018-09-25 DIAGNOSIS — I5022 Chronic systolic (congestive) heart failure: Secondary | ICD-10-CM | POA: Diagnosis not present

## 2018-09-25 NOTE — Progress Notes (Signed)
Daily Session Note  Patient Details  Name: TYLIE GOLONKA MRN: [id number] Date of Birth: [birth year] Referring Provider:     Pulmonary Rehab from 06/11/2018 in [facility] Cardiac and Pulmonary Rehab  Referring Provider  Kathlyn Sacramento MD      Encounter Date: 09/25/2018  Check In: Session Check In - 09/25/18 1149      Check-In   Supervising physician immediately available to respond to emergencies  [facility] immediately available ER MD    Physician(s)  Dr. Jimmye Norman and Alfred Levins    Location  [name] Re[facility]    Staff Present  Renita Papa, RN BSN;Joseph Darrin Nipper, [place], RCEP, CCRP, Exercise Physiologist    Medication changes reported      No    Fall or balance concerns reported     No    Warm-up and Cool-down  Performed as group-led instruction    Resistance Training Performed  Yes    VAD Patient?  No    PAD/SET Patient?  No      Pain Assessment   Currently in Pain?  No/denies          Social History   Tobacco Use  Smoking Status Former Smoker  . Types: Cigarettes  . Last attempt to quit: 1950  . Years since quitting: 70.1  Smokeless Tobacco Never Used    Goals Met:  Proper associated with RPD/PD & O2 Sat Independence with exercise equipment Exercise tolerated well No report of cardiac concerns or symptoms Strength training completed today  Goals Unmet:  Not Applicable  Comments: Pt able to follow exercise prescription today without complaint.  Will continue to monitor for progression.    Dr. Emily Filbert is Medical Director for [place] and [facility].

## 2018-09-27 ENCOUNTER — Encounter: Payer: Medicare Other | Admitting: *Deleted

## 2018-09-27 DIAGNOSIS — I5022 Chronic systolic (congestive) heart failure: Secondary | ICD-10-CM

## 2018-09-27 NOTE — Progress Notes (Signed)
Daily Session Note  Patient Details  Name: Jeanne Pittman MRN: [id number] Date of Birth: 02-23-1935 Referring Provider:     Pulmonary Rehab from 06/11/2018 in [facility] Cardiac and Pulmonary Rehab  Referring Provider  Kathlyn Sacramento MD      Encounter Date: 09/27/2018  Check In: Session Check In - 09/27/18 1137      Check-In   Supervising physician immediately available to respond to emergencies  [facility] immediately available ER MD    Physician(s)  Drs. Filiberto Pinks    Location  [name]    Staff Present  Renita Papa, RN BSN;Yousra Ivens Luan [place], [place], RCEP, CCRP, Exercise Physiologist;Joseph Tessie Fass RCP,RRT,BSRT    Medication changes reported      No    Fall or balance concerns reported     No    Warm-up and Cool-down  Performed as group-led instruction    Resistance Training Performed  Yes    VAD Patient?  No    PAD/SET Patient?  No      Pain Assessment   Currently in Pain?  No/denies          Social History   Tobacco Use  Smoking Status Former Smoker  . Types: Cigarettes  . Last attempt to quit: 1950  . Years since quitting: 70.1  Smokeless Tobacco Never Used    Goals Met:  Proper associated with RPD/PD & O2 Sat Independence with exercise equipment Using PLB without cueing & demonstrates good technique Exercise tolerated well No report of cardiac concerns or symptoms Strength training completed today  Goals Unmet:  Not Applicable  Comments: Pt able to follow exercise prescription today without complaint.  Will continue to monitor for progression.    Dr. Emily Filbert is Medical Director for [place] and [facility].

## 2018-09-30 DIAGNOSIS — I5022 Chronic systolic (congestive) heart failure: Secondary | ICD-10-CM

## 2018-09-30 NOTE — Progress Notes (Signed)
Pulmonary Individual Treatment Plan  Patient Details  Name: Jeanne Pittman MRN: [id number] Date of Birth: 09/22/1934 Referring Provider:     Pulmonary Rehab from 06/11/2018 in [facility] Cardiac and Pulmonary Rehab  Referring Provider  Kathlyn Sacramento MD      Initial Encounter Date:    Pulmonary Rehab from 06/11/2018 in [facility] Cardiac and Pulmonary Rehab  Date  06/11/18      Visit Diagnosis: Chronic systolic heart failure ([place])  Patient's Home Medications on Admission:  Current Outpatient Medications:  .  acetaminophen (TYLENOL) 650 MG CR tablet, Take 650 mg by mouth every 8 (eight) hours as needed for pain., Disp: , Rfl:  .  aspirin 81 MG tablet, Take 81 mg by mouth every morning. , Disp: , Rfl:  .  carvedilol (COREG) 3.125 MG tablet, TAKE 1 TABLET BY MOUTH TWICE DAILY WITH A MEAL, Disp: 60 tablet, Rfl: 6 .  Cholecalciferol (VITAMIN D-3) 5000 UNITS TABS, Take 2,000 Int'l Units by mouth every morning. , Disp: , Rfl:  .  fexofenadine (ALLEGRA) 180 MG tablet, Take 180 mg by mouth daily as needed for rhinitis. , Disp: , Rfl:  .  Fluocinolone Acetonide Scalp 0.01 % OIL, as needed., Disp: , Rfl:  .  furosemide (LASIX) 20 MG tablet, Take 1 tablet (20 mg total) by mouth daily. (Patient taking differently: Take 20 mg by mouth daily as needed. ), Disp: 30 tablet, Rfl: 6 .  gabapentin (NEURONTIN) 300 MG capsule, Take 300 mg by mouth as needed., Disp: , Rfl:  .  latanoprost (XALATAN) 0.005 % ophthalmic solution, Place 1 drop into both eyes at bedtime. , Disp: , Rfl:  .  LORazepam (ATIVAN) 0.5 MG tablet, Take 0.5 mg by mouth every 8 (eight) hours as needed for anxiety. , Disp: , Rfl:  .  losartan (COZAAR) 25 MG tablet, TAKE 1/2 TABLET(12.5 MG) BY MOUTH DAILY AFTER BREAKFAST (Patient taking differently: Take 25 mg by mouth daily. ), Disp: 15 tablet, Rfl: 8 .  mupirocin ointment (BACTROBAN) 2 %, Apply topically as needed., Disp: , Rfl:  .  oxybutynin (DITROPAN-XL) 10 MG 24 hr tablet, Take 1 tablet (10 mg  total) by mouth daily. (Patient not taking: Reported on 06/11/2018), Disp: 30 tablet, Rfl: 3 .  pantoprazole (PROTONIX) 40 MG tablet, TAKE 1 TABLET(40 MG) BY MOUTH TWICE DAILY, Disp: 60 tablet, Rfl: 3 .  rosuvastatin (CRESTOR) 10 MG tablet, TAKE 1 TABLET(10 MG) BY MOUTH EVERY OTHER DAY, Disp: 45 tablet, Rfl: 2 .  timolol (TIMOPTIC) 0.5 % ophthalmic solution, Place 1 drop into both eyes 2 (two) times daily. , Disp: , Rfl:  .  triamcinolone ointment (KENALOG) 0.1 %, as needed., Disp: , Rfl:   Past Medical History: Past Medical History:  Diagnosis Date  . A-fib ([place])    07/2012: A. fib with RVR in the setting of myocardial infarction. Converted to sinus rhythm with amiodarone. No recurrence.  . Anxiety   . Atrophic vaginitis   . Chronic combined systolic (congestive) and diastolic (congestive) heart failure ([place])    a. EF was 25-35% post MI but improved to 45-50% in 04/2013; b. 03/2017 Echo: EF 40-45%, mod focal/basal hypertrophy of septum. Sev mid-apicalanteroseptal, ant, and apical HK. Gr1 DD, mild AI/MR, mod TR, PASP 20mg.  . Colon adenomas   . Coronary artery disease    a. 100/8676STEMI complicated by cardiogenic shock. Cath/PCI: LAD [id number]), RCA 95p(DES). EF 25%; b. 03/2017 MV: EF 57%, fixed apical, periapical, mid to distal anteroseptal defect. No ischemia.  .[name]  Dysrhythmia    A-fib  . Fibrocystic breast disease   . GERD (gastroesophageal reflux disease)   . Glaucoma   . History of gastritis   . Hyperlipidemia   . Hypertension   . Insomnia   . Ischemic cardiomyopathy    a. 07/2012 EF 25-35% following MI-->Improved to 45-50%;  b. 03/2017 Echo: EF 40-45%.  . Myocardial infarction ([place]) 08/18/12  . Non Hodgkin's lymphoma ([place]) 2005   reoccurance 2007 and 2012  . Osteoarthritis   . Polyneuropathy   . Polyposis of colon   . Restless leg syndrome   . Urinary, incontinence, stress female     Tobacco Use: Social History   Tobacco Use  Smoking Status Former Smoker  . Types:  Cigarettes  . Last attempt to quit: 1950  . Years since quitting: 70.1  Smokeless Tobacco Never Used    Labs: Recent Chemical engineer    Labs for ITP Cardiac and Pulmonary Rehab Latest Ref Rng & Units 04/07/2013 09/02/2015   Cholestrol 100 - 199 mg/dL 136 172   LDLCALC 0 - 99 mg/dL 58 99   HDL >39 mg/dL 54 51   Trlycerides 0 - 149 mg/dL 120 111       Pulmonary Assessment Scores: Pulmonary Assessment Scores    Row Name 06/11/18 1238         ADL [facility]   ADL Phase  Entry     SOB Score total  75     Rest  1     Walk  2     Stairs  3     Bath  3     Dress  3     Shop  4       CAT Score   CAT Score  22       mMRC Score   mMRC Score  2        Pulmonary Function Assessment:   Exercise Target Goals: Exercise Program Goal: Individual exercise prescription set using results from initial 6 min walk test and THRR while considering  patient's activity barriers and safety.   Exercise Prescription Goal: Initial exercise prescription builds to 30-45 minutes a day of aerobic activity, 2-3 days per week.  Home exercise guidelines will be given to patient during program as part of exercise prescription that the participant will acknowledge.  Activity Barriers & Risk Stratification: Activity Barriers & Cardiac Risk Stratification - 06/11/18 1320      Activity Barriers & Cardiac Risk Stratification   Activity Barriers  Decreased Ventricular Function;Deconditioning;Muscular Weakness;Shortness of Breath       6 Minute Walk: 6 Minute Walk    Row Name 06/11/18 1314         6 Minute Walk   Phase  Initial     Distance  1170 feet     Walk Time  6 minutes     # of Rest Breaks  0     MPH  2.22     METS  2.04     RPE  11     Perceived Dyspnea   1     VO2 Peak  7.15     Symptoms  No     Resting HR  68 bpm     Resting BP  122/70     Resting Oxygen Saturation   99 %     Exercise Oxygen Saturation  during 6 min walk  94 %     Max Ex. HR  86 bpm  Max Ex. BP  124/66      2 Minute Post BP  128/72       Interval HR   1 Minute HR  76     2 Minute HR  83     3 Minute HR  82     4 Minute HR  84     5 Minute HR  84     6 Minute HR  86     2 Minute Post HR  75     Interval Heart Rate?  Yes       Interval Oxygen   Interval Oxygen?  Yes     Baseline Oxygen Saturation %  99 %     1 Minute Oxygen Saturation %  95 %     1 Minute Liters of Oxygen  0 L Room Air     2 Minute Oxygen Saturation %  95 %     2 Minute Liters of Oxygen  0 L     3 Minute Oxygen Saturation %  94 %     3 Minute Liters of Oxygen  0 L     4 Minute Oxygen Saturation %  95 %     4 Minute Liters of Oxygen  0 L     5 Minute Oxygen Saturation %  94 %     5 Minute Liters of Oxygen  0 L     6 Minute Oxygen Saturation %  95 %     6 Minute Liters of Oxygen  0 L     2 Minute Post Oxygen Saturation %  98 %     2 Minute Post Liters of Oxygen  0 L       Oxygen Initial Assessment: Oxygen Initial Assessment - 06/11/18 1250      Home Oxygen   Home Oxygen Device  None    Sleep Oxygen Prescription  None    Home Exercise Oxygen Prescription  None    Home at Rest Exercise Oxygen Prescription  None      Initial 6 min Walk   Oxygen Used  None      Program Oxygen Prescription   Program Oxygen Prescription  None      Intervention   Short Term Goals  To learn and understand importance of monitoring SPO2 with pulse oximeter and demonstrate accurate use of the pulse oximeter.;To learn and understand importance of maintaining oxygen saturations>88%;To learn and demonstrate proper pursed lip breathing techniques or other breathing techniques.    Long  Term Goals  Verbalizes importance of monitoring SPO2 with pulse oximeter and return demonstration;Maintenance of O2 saturations>88%;Exhibits proper breathing techniques, such as pursed lip breathing or other method taught during program session       Oxygen Re-Evaluation: Oxygen Re-Evaluation    Row Name 06/14/18 1127 06/17/18 1628 06/28/18 1205  08/16/18 1155 09/11/18 1236     Program Oxygen Prescription   Program Oxygen Prescription  -  -  None  None  None     Home Oxygen   Home Oxygen Device  -  -  None  None  None   Sleep Oxygen Prescription  -  -  None  None  None   Home Exercise Oxygen Prescription  -  -  None  None  None   Home at Rest Exercise Oxygen Prescription  -  -  None  None  None     Goals/Expected Outcomes   Short Term Goals  -  To  learn and understand importance of monitoring SPO2 with pulse oximeter and demonstrate accurate use of the pulse oximeter.;To learn and understand importance of maintaining oxygen saturations>88%;To learn and demonstrate proper pursed lip breathing techniques or other breathing techniques.  To learn and understand importance of monitoring SPO2 with pulse oximeter and demonstrate accurate use of the pulse oximeter.;To learn and understand importance of maintaining oxygen saturations>88%;To learn and demonstrate proper pursed lip breathing techniques or other breathing techniques.  To learn and understand importance of monitoring SPO2 with pulse oximeter and demonstrate accurate use of the pulse oximeter.;To learn and understand importance of maintaining oxygen saturations>88%;To learn and demonstrate proper pursed lip breathing techniques or other breathing techniques.  To learn and understand importance of monitoring SPO2 with pulse oximeter and demonstrate accurate use of the pulse oximeter.;To learn and understand importance of maintaining oxygen saturations>88%;To learn and demonstrate proper pursed lip breathing techniques or other breathing techniques.   Long  Term Goals  -  Verbalizes importance of monitoring SPO2 with pulse oximeter and return demonstration;Maintenance of O2 saturations>88%;Exhibits proper breathing techniques, such as pursed lip breathing or other method taught during program session  Verbalizes importance of monitoring SPO2 with pulse oximeter and return  demonstration;Maintenance of O2 saturations>88%;Exhibits proper breathing techniques, such as pursed lip breathing or other method taught during program session  Verbalizes importance of monitoring SPO2 with pulse oximeter and return demonstration;Maintenance of O2 saturations>88%;Exhibits proper breathing techniques, such as pursed lip breathing or other method taught during program session  Verbalizes importance of monitoring SPO2 with pulse oximeter and return demonstration;Maintenance of O2 saturations>88%;Exhibits proper breathing techniques, such as pursed lip breathing or other method taught during program session   Comments  -  Reviewed PLB technique with pt.  Talked about how it work and it's important to maintaining his exercise saturations.    Jeanne Pittman is off to a good start in rehab.  She is doing better with her breathing.  She has not gotten a pulse oximeter yet, but is still planning to.   She has been doing well with her PLB and finds it helpful when she is SOB.  She will sit down and use her PLB to recover.    Jeanne Pittman is doing well in rehab.  She uses her PLB routinely and has found it to be helpful when she wakes at night to help her get back to sleep. She has not gotten a new pulse oximeter yet.    Jeanne Pittman is doing well in rehab. She is able to do more at home with less SOB.  She is doing well with her PLB.  She still uses it to help her relax as well. She still has not gotten the pulse oximeter and has not found her old one.    Goals/Expected Outcomes  -  Short: Become more profiecient at using PLB.   Long: Become independent at using PLB.  Short: Get pulse oximeter  Long: Continue to work on using PLB.   Short: Get pulse oximeter.  Long: Continue to use PLB routinely.   Short; Get a new pulse oximeter  Long: Continue to use PLB consistently.       Oxygen Discharge (Final Oxygen Re-Evaluation): Oxygen Re-Evaluation - 09/11/18 1236      Program Oxygen Prescription   Program Oxygen Prescription   None      Home Oxygen   Home Oxygen Device  None    Sleep Oxygen Prescription  None    Home Exercise Oxygen Prescription  None  Home at Rest Exercise Oxygen Prescription  None      Goals/Expected Outcomes   Short Term Goals  To learn and understand importance of monitoring SPO2 with pulse oximeter and demonstrate accurate use of the pulse oximeter.;To learn and understand importance of maintaining oxygen saturations>88%;To learn and demonstrate proper pursed lip breathing techniques or other breathing techniques.    Long  Term Goals  Verbalizes importance of monitoring SPO2 with pulse oximeter and return demonstration;Maintenance of O2 saturations>88%;Exhibits proper breathing techniques, such as pursed lip breathing or other method taught during program session    Comments  Jeanne Pittman is doing well in rehab. She is able to do more at home with less SOB.  She is doing well with her PLB.  She still uses it to help her relax as well. She still has not gotten the pulse oximeter and has not found her old one.     Goals/Expected Outcomes  Short; Get a new pulse oximeter  Long: Continue to use PLB consistently.        Initial Exercise Prescription: Initial Exercise Prescription - 06/11/18 1400      Date of Initial Exercise RX and Referring Provider   Date  06/11/18    Referring Provider  Kathlyn Sacramento MD      Treadmill   MPH  2.2    Grade  0.5    Minutes  15    METs  2.84      NuStep   Level  4    SPM  80    Minutes  15    METs  2.2      REL-XR   Level  2    Speed  50    Minutes  15    METs  2.2      Prescription Details   Frequency (times per week)  3    Duration  Progress to 45 minutes of aerobic exercise without signs/symptoms of physical distress      Intensity   THRR 40-80% of Max Heartrate  96-125    Ratings of Perceived Exertion  11-13    Perceived Dyspnea  0-4      Progression   Progression  Continue to progress workloads to maintain intensity without  signs/symptoms of physical distress.      Resistance Training   Training Prescription  Yes    Weight  3 lbs    Reps  10-15       Perform Capillary Blood Glucose checks as needed.  Exercise Prescription Changes: Exercise Prescription Changes    Row Name 06/11/18 1400 06/25/18 1600 06/26/18 1200 07/10/18 1500 08/06/18 1300     Response to Exercise   Blood Pressure (Admit)  122/70  124/60  -  128/78  122/64   Blood Pressure (Exercise)  124/66  134/64  -  -  -   Blood Pressure (Exit)  128/72  110/62  -  100/54  122/80   Heart Rate (Admit)  68 bpm  109 bpm  -  82 bpm  92 bpm   Heart Rate (Exercise)  86 bpm  100 bpm  -  99 bpm  94 bpm   Heart Rate (Exit)  75 bpm  77 bpm  -  73 bpm  71 bpm   Oxygen Saturation (Admit)  99 %  92 %  -  100 %  99 %   Oxygen Saturation (Exercise)  94 %  95 %  -  93 %  91 %   Oxygen  Saturation (Exit)  98 %  98 %  -  98 %  93 %   Rating of Perceived Exertion (Exercise)  11  13  -  15  13   Perceived Dyspnea (Exercise)  1  1  -  2  0   Symptoms  none  none  -  none  none   Comments  walk test results  -  -  -  -   Duration  -  Continue with 45 min of aerobic exercise without signs/symptoms of physical distress.  -  Continue with 45 min of aerobic exercise without signs/symptoms of physical distress.  Continue with 45 min of aerobic exercise without signs/symptoms of physical distress.   Intensity  -  THRR unchanged  -  THRR unchanged  THRR unchanged     Progression   Progression  -  Continue to progress workloads to maintain intensity without signs/symptoms of physical distress.  -  Continue to progress workloads to maintain intensity without signs/symptoms of physical distress.  Continue to progress workloads to maintain intensity without signs/symptoms of physical distress.   Average METs  -  3.61  -  2.94  3.11     Resistance Training   Training Prescription  -  Yes  -  Yes  Yes   Weight  -  3 lbs  -  3 lbs  3 lbs   Reps  -  10-15  -  10-15  10-15      Interval Training   Interval Training  -  No  -  No  No     Treadmill   MPH  -  2.2  -  2.2  2.2   Grade  -  0.5  -  0.5  0.5   Minutes  -  15  -  1  15   METs  -  2.84  -  2.84  2.84     NuStep   Level  -  4  -  5  5   Minutes  -  15  -  15  15   METs  -  3  -  2.4  2.2     REL-XR   Level  -  2  -  2  2   Minutes  -  15  -  15  15   METs  -  5  -  3.6  4.3     Home Exercise Plan   Plans to continue exercise at  -  -  Home (comment) walking  Home (comment) walking  Home (comment) walking   Frequency  -  -  Add 1 additional day to program exercise sessions.  Add 1 additional day to program exercise sessions.  Add 1 additional day to program exercise sessions.   Initial Home Exercises Provided  -  -  06/26/18  06/26/18  06/26/18   Row Name 08/22/18 1600 09/02/18 1600 09/17/18 1400         Response to Exercise   Blood Pressure (Admit)  120/68  122/80  122/66     Blood Pressure (Exit)  116/64  126/60  110/60     Heart Rate (Admit)  69 bpm  78 bpm  96 bpm     Heart Rate (Exercise)  83 bpm  90 bpm  100 bpm     Heart Rate (Exit)  68 bpm  79 bpm  72 bpm     Oxygen Saturation (Admit)  97 %  99 %  91 %     Oxygen Saturation (Exercise)  95 %  95 %  98 %     Oxygen Saturation (Exit)  98 %  97 %  95 %     Rating of Perceived Exertion (Exercise)  _0 Perceived Dyspnea (Exercise)  _1 Symptoms  none  none  none     Duration  Continue with 45 min of aerobic exercise without signs/symptoms of physical distress.  Continue with 45 min of aerobic exercise without signs/symptoms of physical distress.  Continue with 45 min of aerobic exercise without signs/symptoms of physical distress.     Intensity  THRR unchanged  THRR unchanged  THRR unchanged       Progression   Progression  Continue to progress workloads to maintain intensity without signs/symptoms of physical distress.  Continue to progress workloads to maintain intensity without signs/symptoms of physical distress.   Continue to progress workloads to maintain intensity without signs/symptoms of physical distress.     Average METs  2.7  3.18  2.73       Resistance Training   Training Prescription  Yes  Yes  Yes     Weight  3 lb  3 lbs  3 lbs     Reps  10-15  10-15  10-15       Interval Training   Interval Training  No  No  No       Treadmill   MPH  2.2  2.2  2.5     Grade  0.5  0.5  0.5     Minutes  _2 METs  2.84  2.84  3.09       NuStep   Level  _3 SPM  80  -  -     Minutes  _4 METs  2.2  2.5  2.6       REL-XR   Level  _5 Speed  50  -  -     Minutes  _6 METs  3.1  4.2  2.6       Home Exercise Plan   Plans to continue exercise at  Home (comment) walking  Home (comment) walking  Home (comment) walking     Frequency  Add 1 additional day to program exercise sessions.  Add 1 additional day to program exercise sessions.  Add 1 additional day to program exercise sessions.     Initial Home Exercises Provided  06/26/18  06/26/18  06/26/18        Exercise Comments: Exercise Comments    Row Name 06/14/18 1126 06/25/18 1627         Exercise Comments  -  First full day of exercise was 06/17/18 !  Patient was oriented to gym and equipment including functions, settings, policies, and procedures.  Patient's individual exercise prescription and treatment plan were reviewed.  All starting workloads were established based on the results of the 6 minute walk test done at initial orientation visit.  The plan for exercise progression was also introduced and progression will be customized based on patient's performance and goals.         Exercise Goals and Review: Exercise Goals  [place] Name 06/11/18 1429             Exercise Goals   Increase Physical Activity  Yes       Intervention  Provide advice, education, support and counseling about physical activity/exercise needs.;Develop an individualized exercise prescription for aerobic and  resistive training based on initial evaluation findings, risk stratification, comorbidities and participant's personal goals.       Expected Outcomes  Short Term: Attend rehab on a regular basis to increase amount of physical activity.;Long Term: Add in home exercise to make exercise part of routine and to increase amount of physical activity.;Long Term: Exercising regularly at least 3-5 days a week.       Increase Strength and Stamina  Yes       Intervention  Provide advice, education, support and counseling about physical activity/exercise needs.;Develop an individualized exercise prescription for aerobic and resistive training based on initial evaluation findings, risk stratification, comorbidities and participant's personal goals.       Expected Outcomes  Short Term: Increase workloads from initial exercise prescription for resistance, speed, and METs.;Short Term: Perform resistance training exercises routinely during rehab and add in resistance training at home;Long Term: Improve cardiorespiratory fitness, muscular endurance and strength as measured by increased METs and functional capacity (6MWT)       Able to understand and use rate of perceived exertion (RPE) scale  Yes       Intervention  Provide education and explanation on how to use RPE scale       Expected Outcomes  Short Term: Able to use RPE daily in rehab to express subjective intensity level;Long Term:  Able to use RPE to guide intensity level when exercising independently       Able to understand and use Dyspnea scale  Yes       Intervention  Provide education and explanation on how to use Dyspnea scale       Expected Outcomes  Short Term: Able to use Dyspnea scale daily in rehab to express subjective sense of shortness of breath during exertion;Long Term: Able to use Dyspnea scale to guide intensity level when exercising independently       Knowledge and understanding of Target Heart Rate Range (THRR)  Yes       Intervention  Provide  education and explanation of THRR including how the numbers were predicted and where they are located for reference       Expected Outcomes  Short Term: Able to state/look up THRR;Short Term: Able to use daily as guideline for intensity in rehab;Long Term: Able to use THRR to govern intensity when exercising independently       Able to check pulse independently  Yes       Intervention  Provide education and demonstration on how to check pulse in carotid and radial arteries.;Review the importance of being able to check your own pulse for safety during independent exercise       Expected Outcomes  Short Term: Able to explain why pulse checking is important during independent exercise;Long Term: Able to check pulse independently and accurately       Understanding of Exercise Prescription  Yes       Intervention  Provide education, explanation, and written materials on patient's individual exercise prescription       Expected Outcomes  Short Term: Able to explain program exercise prescription;Long Term: Able to explain home exercise prescription to exercise independently          Exercise Goals  Re-Evaluation : Exercise Goals Re-Evaluation    Row Name 06/14/18 1126 06/17/18 1628 06/25/18 1630 06/26/18 1220 06/28/18 1159     Exercise Goal Re-Evaluation   Exercise Goals Review  -  Increase Physical Activity;Increase Strength and Stamina;Understanding of Exercise Prescription;Able to understand and use rate of perceived exertion (RPE) scale;Knowledge and understanding of Target Heart Rate Range (THRR)  Increase Physical Activity;Increase Strength and Stamina;Understanding of Exercise Prescription  Increase Physical Activity;Increase Strength and Stamina;Able to understand and use rate of perceived exertion (RPE) scale;Knowledge and understanding of Target Heart Rate Range (THRR);Able to check pulse independently;Understanding of Exercise Prescription  Increase Physical Activity;Increase Strength and  Stamina;Understanding of Exercise Prescription   Comments  -  Reviewed RPE scale, THR and program prescription with pt today.  Pt voiced understanding and was given a copy of goals to take home.   Jeanne Pittman is off to a good start in rehab.  She has been able to pick up the workloads she had gotten up to in [facility].  She has completed three full days of exercise. We will continue to monitor her progression.   Reviewed home exercise with pt today.  Pt plans to walk at home for exercise.  Reviewed THR, pulse, RPE, sign and symptoms, and when to call 911 or MD.  Also discussed weather considerations and indoor options.  Pt voiced understanding.  Jeanne Pittman is off to a good start in rehab.  We just reviewed home exercise and she is supposed to add it in next week.  She has noticed that exercise has helped increase her strength and stamina.    Expected Outcomes  -  Short: Use RPE daily to regulate intensity. Long: Follow program prescription in THR.  Short: Review home exercise guidelines.  Long: Continue to follow program prescription.   Short: Start to walk more at home.  Long: Continue to exercise more independently.   Short: Start walking at home.  Long: Continue to increase strength and stamina.    Jeanne Pittman Name 07/10/18 1522 07/23/18 1533 08/06/18 1340 08/16/18 1148 08/22/18 1658     Exercise Goal Re-Evaluation   Exercise Goals Review  Increase Physical Activity;Increase Strength and Stamina;Understanding of Exercise Prescription  -  Increase Physical Activity;Increase Strength and Stamina;Understanding of Exercise Prescription  Increase Physical Activity;Increase Strength and Stamina;Understanding of Exercise Prescription  Increase Physical Activity;Increase Strength and Stamina;Able to understand and use rate of perceived exertion (RPE) scale;Able to understand and use Dyspnea scale;Knowledge and understanding of Target Heart Rate Range (THRR);Understanding of Exercise Prescription   Comments  Jeanne Pittman has been doing well in  rehab.  She will be out at least through next week as she is having eye surgery on Friday.  She now up to level 5 on the NuStep.  We will continue to monitor her progress.   Out since last review  Jeanne Pittman returned yesterday after being cleared from her eye surgery.  She was able to come back and do her full workout.  We will continue to monitor her progress.   Jeanne Pittman is doing well being back in rehab.  She is doing some of her exercise at home, but has not been doing it during the last two weeks with kids being out of school.  She is feeling stronger and getting her stamina built back up.   Jeanne Pittman has maintained levels of exercise since last review.  She attended 4 sessions in December.  Regular attendance will enhance her progress.   Expected Outcomes  Short: Increase XR.  Long: Continue  to walk more at home.   -  Short: Get back into swing of class and then start to increase workloads.  Long; Continue to increase strength and stamina.   Short: Continue to get back into routine of exercise.  Long: Continue to build strength and stamina.   Short - attend 2-3 times per week Long - improve overall MET level   Row Name 09/02/18 1618 09/11/18 1226 09/17/18 1413         Exercise Goal Re-Evaluation   Exercise Goals Review  Increase Physical Activity;Increase Strength and Stamina;Understanding of Exercise Prescription  Increase Physical Activity;Increase Strength and Stamina;Understanding of Exercise Prescription  Increase Physical Activity;Increase Strength and Stamina;Understanding of Exercise Prescription     Comments  Jeanne Pittman has been doing well in rehab.  She is now up to 4.2 METs on the XR.  We will continue to monitor her progress.   Jeanne Pittman is doing well in rehab. She is feeling better overall and getting stronger.  Jeanne Pittman has been doing her ADLs more easily.  She admits to being lazy about her exercise.   Jeanne Pittman continues to do well in rehab.  She should be ready to increase her XR.  We will continue to monitor her  progress.      Expected Outcomes  Short: Continue to increase workloads. Long: Continue to improve strength and stamina.   Short: Get back to walking at home.  Long: Continue to increase activity levels.   Short: Increase XR.  Long: Continue to increase activity at home.         Discharge Exercise Prescription (Final Exercise Prescription Changes): Exercise Prescription Changes - 09/17/18 1400      Response to Exercise   Blood Pressure (Admit)  122/66    Blood Pressure (Exit)  110/60    Heart Rate (Admit)  96 bpm    Heart Rate (Exercise)  100 bpm    Heart Rate (Exit)  72 bpm    Oxygen Saturation (Admit)  91 %    Oxygen Saturation (Exercise)  98 %    Oxygen Saturation (Exit)  95 %    Rating of Perceived Exertion (Exercise)  11    Perceived Dyspnea (Exercise)  1    Symptoms  none    Duration  Continue with 45 min of aerobic exercise without signs/symptoms of physical distress.    Intensity  THRR unchanged      Progression   Progression  Continue to progress workloads to maintain intensity without signs/symptoms of physical distress.    Average METs  2.73      Resistance Training   Training Prescription  Yes    Weight  3 lbs    Reps  10-15      Interval Training   Interval Training  No      Treadmill   MPH  2.5    Grade  0.5    Minutes  15    METs  3.09      NuStep   Level  5    Minutes  15    METs  2.6      REL-XR   Level  3    Minutes  15    METs  2.6      Home Exercise Plan   Plans to continue exercise at  Home (comment)   walking   Frequency  Add 1 additional day to program exercise sessions.    Initial Home Exercises Provided  06/26/18       Nutrition:  Target Goals: Understanding of nutrition guidelines, daily intake of sodium <1567m, cholesterol <208m calories 30% from fat and 7% or less from saturated fats, daily to have 5 or more servings of fruits and vegetables.  Biometrics: Pre Biometrics - 06/11/18 1429      Pre Biometrics   Height  5'  (1.524 m)    Weight  108 lb 8 oz (49.2 kg)    Waist Circumference  26 inches    Hip Circumference  35 inches    Waist to Hip Ratio  0.74 %    BMI (Calculated)  21.19    Single Leg Stand  21.44 seconds        Nutrition Therapy Plan and Nutrition Goals: Nutrition Therapy & Goals - 08/19/18 1226      Nutrition Therapy   Diet  DASH    Protein (specify units)  6oz    Fiber  20 grams    Whole Grain Foods  3 servings   does not typically choose whole grains   Saturated Fats  11 max. grams    Fruits and Vegetables  4 servings/day   8 ideal; she has a fair-poor appetite   Sodium  1500 grams      Personal Nutrition Goals   Nutrition Goal  D/t recent wt loss, be consistent about drinking your protein shakes daily, eat snacks between meals more regularly, and choose fuller fat options for items like yogurt or cottage cheese    Personal Goal #2  Continue to limit sodium intake and to include a variety of protein sources    Comments  Pt reports recent wt loss of 8-10# over the past couple of months r/t decreased appetite and depression s/p friend passing away. UBW 115#. However she feels she is slowly regaining her appetite. She "gave up" salt in 2015 and sometimes misses the flavor that salt brings to food. She eats 3 meals/day, with breakfast being her largest meal and lunch and supper being "light." Breakfast options, egg, bacon, white toast, oatmeal, cereal, grits. Lunch options: [employer] soup occasionally, tuna or chicken salad, vegetables. Dinner options: soup, 1/2 sandwich, vegetables. Snacks: fruit, yogurt, energy bar, protein shake mid morning. Beverages: soda occasionally, water mostly, coffee, hot tea. She does not eat red meat but enjoys other meats like chicken, fish, shrimp regularly. If she buys canned vegetables she rinses them off with water first. She does not eat out often      Intervention Plan   Intervention  Prescribe, educate and counsel regarding individualized specific  dietary modifications aiming towards targeted core components such as weight, hypertension, lipid management, diabetes, heart failure and other comorbidities.    Expected Outcomes  Short Term Goal: Understand basic principles of dietary content, such as calories, fat, sodium, cholesterol and nutrients.;Short Term Goal: A plan has been developed with personal nutrition goals set during dietitian appointment.;Long Term Goal: Adherence to prescribed nutrition plan.       Nutrition Assessments: Nutrition Assessments - 06/11/18 1238      MEDFICTS Scores   Pre Score  35       Nutrition Goals Re-Evaluation: Nutrition Goals Re-Evaluation    Jeanne Pittman 08/19/18 1246 09/11/18 1233           Goals   Nutrition Goal  D/t recent wt loss, be consistent about drinking your protein shakes daily, eat snacks between meals more regularly, and choose fuller fat options for items like yogurt or cottage cheese  More consistent with protein shakes, regular meals and  snacks, small portions, limit sodium      Comment  She has lost 8-10# from her UBW r/t depression and lack of appetite, though she feels it is back on the up-swing. She eats small portions particularly for lunch and supper  Jeanne Pittman has been doing well with limiting her sodium intake despite liking her salt.  She is more consistent with her shakes and getting more regular meals.  She continues to stick with small portions and eating more often.        Expected Outcome  Be consistent about drinking at least 1 protein shake per day, add at least 1 additional snack in each day and look for snacks that contain healthy fats/ that are full-fat rather than non-fat  Short: Stick with eating more consistently.  Long: Continue to monitor and limit salt intake.         Personal Goal #2 Re-Evaluation   Personal Goal #2  Continue to limit sodium intake and to include a variety of protein sources  -         Nutrition Goals Discharge (Final Nutrition Goals  Re-Evaluation): Nutrition Goals Re-Evaluation - 09/11/18 1233      Goals   Nutrition Goal  More consistent with protein shakes, regular meals and snacks, small portions, limit sodium    Comment  Merdis has been doing well with limiting her sodium intake despite liking her salt.  She is more consistent with her shakes and getting more regular meals.  She continues to stick with small portions and eating more often.      Expected Outcome  Short: Stick with eating more consistently.  Long: Continue to monitor and limit salt intake.        Psychosocial: Target Goals: Acknowledge presence or absence of significant depression and/or stress, maximize coping skills, provide positive support system. Participant is able to verbalize types and ability to use techniques and skills needed for reducing stress and depression.   Initial Review & Psychosocial Screening: Initial Psych Review & Screening - 06/11/18 1251      Initial Review   Current issues with  Current Sleep Concerns;Current Stress Concerns    Source of Stress Concerns  Chronic Illness    Comments  Unable to sleep good every night. Tries not to take her lorazapam at night.  Health continues to be her biggest stressor.  She is able to do things she wants to do, but often needs to stop earlier than she wants.       Family Dynamics   Good Support System?  No   Limited   Comments  Lives by her self.  Son lives in [place].  Relies on neighbors and friends to take care of each other.       Barriers   Psychosocial barriers to participate in program  The patient should benefit from training in stress management and relaxation.;Psychosocial barriers identified (see note)      Screening Interventions   Interventions  Encouraged to exercise;Program counselor consult;Provide feedback about the scores to participant    Expected Outcomes  Short Term goal: Utilizing psychosocial counselor, staff and physician to assist with identification of specific  Stressors or current issues interfering with healing process. Setting desired goal for each stressor or current issue identified.;Long Term Goal: Stressors or current issues are controlled or eliminated.;Short Term goal: Identification and review with participant of any Quality of Life or Depression concerns found by scoring the questionnaire.;Long Term goal: The participant improves quality of Life and PHQ9  Scores as seen by post scores and/or verbalization of changes       Quality of Life Scores:  Scores of 19 and below usually indicate a poorer quality of life in these areas.  A difference of  2-3 points is a clinically meaningful difference.  A difference of 2-3 points in the total score of the Quality of Life Index has been associated with significant improvement in overall quality of life, self-image, physical symptoms, and general health in studies assessing change in quality of life.  PHQ-9: Recent Review Flowsheet Data    Depression screen [facility] 2/9 08/19/2018 06/11/2018   Decreased Interest 0 1   Down, Depressed, Hopeless 0 1   PHQ - 2 Score 0 2   Altered sleeping 2 2   Tired, decreased energy 1 2   Change in appetite 1 3   Feeling bad or failure about yourself  0 1   Trouble concentrating 0 1   Moving slowly or fidgety/restless 0 0   Suicidal thoughts 0 0   PHQ-9 Score 4 11   Difficult doing work/chores Not difficult at all Not difficult at all     Interpretation of Total Score  Total Score Depression Severity:  1-4 = Minimal depression, 5-9 = Mild depression, 10-14 = Moderate depression, 15-19 = Moderately severe depression, 20-27 = Severe depression   Psychosocial Evaluation and Intervention: Psychosocial Evaluation - 06/24/18 1225      Psychosocial Evaluation & Interventions   Interventions  Encouraged to exercise with the program and follow exercise prescription    Comments  Counselor met with Ms. Ebbie Latus) today for initial psychosocial evaluation.  She is an 84  year old who has been in the Cardiac rehab and CARE programs and her Dr. recommended this program.  She has a history of lymphoma - now in remission.  She has a good support system with a son and she lives in a retirement community with good friends.  Graciemae reports sleeping "okay," with the help of medication at times.  She has a "terrible appetite" and reports this has been going on for quite some time.  Daionna denies a history of depression or anxiety or any current symptoms and her mood is typically positive.  Her health is her primary stressor at this time.  Brittne's goal for this program is to "get through it" and to increase her energy.  She will be followed by staff here.     Expected Outcomes  Short:  Jachelle will benefit from consistent exercise to increase her energy and improve her sleep and appetite.   Long:  Korinna will continue positive life style choices to improve her health.    Continue Psychosocial Services   Follow up required by staff       Psychosocial Re-Evaluation: Psychosocial Re-Evaluation    [place] Name 06/28/18 1200 08/16/18 1151 08/19/18 1507 09/11/18 1231       Psychosocial Re-Evaluation   Current issues with  Current Sleep Concerns;Current Stress Concerns  Current Sleep Concerns;Current Stress Concerns  Current Sleep Concerns;Current Stress Concerns  Current Sleep Concerns;Current Stress Concerns    Comments  Jeanne Pittman is off to a good start in rehab.  She has noticed that she sleeps better on the nights that she has rehab. She is struggling with her poor appeptite.  Her doctor is not worried about it yet.  Her health continues to be her biggest stressor.   She has some stress from her neighborhood asking for more money and she is  trying to get money to fix her roof.   Jeanne Pittman is doing well mentally and in a good palce.  She is sleeping better.  She was able to get the roof fixed and insurance is going to help pay.    Reviewed patient health questionnaire (PHQ-9) with patient for follow up.  Previously, patients score indicated signs/symptoms of depression.  Reviewed to see if patient is improving symptom wise while in program.  Score improved and patient states that it is because she is not greiving for the death of her friend that she lost a few months ago. She is doing well in the program and has a positive attitude.  Jeanne Pittman has been doing well mentally.  She got a bill for her car that she wasn't expecting.  She has been sleeping well.  She is staying positive and keeping up with self care.  She does worry about her great grands but tries not to let it get to her as much.     Expected Outcomes  Short: Continue to exericse more to sleep better.  Long: Continue to practice self care.   Short: Continue to exercise for sleep.  Long: Continue to take care of self.   Short: Continue to attend [facility] regularly for regular exercise and social engagement. Long: Continue to improve symptoms and manage a positive mental state  Short: Continue to regularly.  Long: Continue to practice self care.     Interventions  -  -  Encouraged to attend Pulmonary Rehabilitation for the exercise;Stress management education  Encouraged to attend Pulmonary Rehabilitation for the exercise;Stress management education    Continue Psychosocial Services   -  -  Follow up required by staff  Follow up required by staff       Psychosocial Discharge (Final Psychosocial Re-Evaluation): Psychosocial Re-Evaluation - 09/11/18 1231      Psychosocial Re-Evaluation   Current issues with  Current Sleep Concerns;Current Stress Concerns    Comments  Jeanne Pittman has been doing well mentally.  She got a bill for her car that she wasn't expecting.  She has been sleeping well.  She is staying positive and keeping up with self care.  She does worry about her great grands but tries not to let it get to her as much.     Expected Outcomes  Short: Continue to regularly.  Long: Continue to practice self care.     Interventions  Encouraged to  attend Pulmonary Rehabilitation for the exercise;Stress management education    Continue Psychosocial Services   Follow up required by staff       Education: Education Goals: Education classes will be provided on a weekly basis, covering required topics. Participant will state understanding/return demonstration of topics presented.  Learning Barriers/Preferences: Learning Barriers/Preferences - 06/11/18 1255      Learning Barriers/Preferences   Learning Barriers  Sight   wears glasses   Learning Preferences  None       Education Topics:  Initial Evaluation Education: - Verbal, written and demonstration of respiratory meds, oximetry and breathing techniques. Instruction on use of nebulizers and MDIs and importance of monitoring MDI activations.   Pulmonary Rehab from 09/27/2018 in [facility] Cardiac and Pulmonary Rehab  Date  06/11/18  Educator  [facility]  Instruction Review Code  1- Verbalizes Understanding      General Nutrition Guidelines/Fats and Fiber: -Group instruction provided by verbal, written material, models and posters to present the general guidelines for heart healthy nutrition. Gives an explanation and review of dietary  fats and fiber.   Pulmonary Rehab from 09/27/2018 in [facility] Cardiac and Pulmonary Rehab  Date  06/26/18  Educator  [facility]  Instruction Review Code  1- Verbalizes Understanding      Controlling Sodium/Reading Food Labels: -Group verbal and written material supporting the discussion of sodium use in heart healthy nutrition. Review and explanation with models, verbal and written materials for utilization of the food label.   Pulmonary Rehab from 09/27/2018 in [facility] Cardiac and Pulmonary Rehab  Date  07/10/18  Educator  [facility]  Instruction Review Code  1- Verbalizes Understanding      Exercise Physiology & General Exercise Guidelines: - Group verbal and written instruction with models to review the exercise physiology of the cardiovascular system and associated critical  values. Provides general exercise guidelines with specific guidelines to those with heart or lung disease.    Pulmonary Rehab from 09/27/2018 in [facility] Cardiac and Pulmonary Rehab  Date  09/11/18  Educator  [facility]  Instruction Review Code  1- Verbalizes Understanding      Aerobic Exercise & Resistance Training: - Gives group verbal and written instruction on the various components of exercise. Focuses on aerobic and resistive training programs and the benefits of this training and how to safely progress through these programs.   Pulmonary Rehab from 09/27/2018 in [facility] Cardiac and Pulmonary Rehab  Date  09/13/18  Educator  [facility]  Instruction Review Code  1- Verbalizes Understanding      Flexibility, Balance, Mind/Body Relaxation: Provides group verbal/written instruction on the benefits of flexibility and balance training, including mind/body exercise modes such as yoga, pilates and tai chi.  Demonstration and skill practice provided.   Pulmonary Rehab from 09/27/2018 in [facility] Cardiac and Pulmonary Rehab  Date  09/18/18  Educator  [facility]  Instruction Review Code  1- Verbalizes Understanding      Stress and Anxiety: - Provides group verbal and written instruction about the health risks of elevated stress and causes of high stress.  Discuss the correlation between heart/lung disease and anxiety and treatment options. Review healthy ways to manage with stress and anxiety.   Pulmonary Rehab from 09/27/2018 in [facility] Cardiac and Pulmonary Rehab  Date  09/25/18  Educator  [facility]  Instruction Review Code  1- Verbalizes Understanding      Depression: - Provides group verbal and written instruction on the correlation between heart/lung disease and depressed mood, treatment options, and the stigmas associated with seeking treatment.   Exercise & Equipment Safety: - Individual verbal instruction and demonstration of equipment use and safety with use of the equipment.   Pulmonary Rehab from 09/27/2018 in [facility] Cardiac  and Pulmonary Rehab  Date  06/11/18  Educator  [facility]  Instruction Review Code  1- Verbalizes Understanding      Infection Prevention: - Provides verbal and written material to individual with discussion of infection control including proper hand washing and proper equipment cleaning during exercise session.   Pulmonary Rehab from 09/27/2018 in [facility] Cardiac and Pulmonary Rehab  Date  06/11/18  Educator  [facility]  Instruction Review Code  1- Verbalizes Understanding      Falls Prevention: - Provides verbal and written material to individual with discussion of falls prevention and safety.   Pulmonary Rehab from 09/27/2018 in [facility] Cardiac and Pulmonary Rehab  Date  06/11/18  Educator  [facility]  Instruction Review Code  1- Verbalizes Understanding      Diabetes: - Individual verbal and written instruction to review signs/symptoms of diabetes, desired ranges of glucose  level fasting, after meals and with exercise. Advice that pre and post exercise glucose checks will be done for 3 sessions at entry of program.   Chronic Lung Diseases: - Group verbal and written instruction to review updates, respiratory medications, advancements in procedures and treatments. Discuss use of supplemental oxygen including available portable oxygen systems, continuous and intermittent flow rates, concentrators, personal use and safety guidelines. Review proper use of inhaler and spacers. Provide informative websites for self-education.    Pulmonary Rehab from 09/27/2018 in [facility] Cardiac and Pulmonary Rehab  Date  09/27/18  Educator  [facility]  Instruction Review Code  1- Verbalizes Understanding      Energy Conservation: - Provide group verbal and written instruction for methods to conserve energy, plan and organize activities. Instruct on pacing techniques, use of adaptive equipment and posture/positioning to relieve shortness of breath.   Pulmonary Rehab from 09/27/2018 in [facility] Cardiac and Pulmonary Rehab  Date  06/19/18   Educator  [facility]  Instruction Review Code  1- Verbalizes Understanding      Triggers and Exacerbations: - Group verbal and written instruction to review types of environmental triggers and ways to prevent exacerbations. Discuss weather changes, air quality and the benefits of nasal washing. Review warning signs and symptoms to help prevent infections. Discuss techniques for effective airway clearance, coughing, and vibrations.   AED/CPR: - Group verbal and written instruction with the use of models to demonstrate the basic use of the AED with the basic ABC's of resuscitation.   Pulmonary Rehab from 09/27/2018 in [facility] Cardiac and Pulmonary Rehab  Date  09/04/18  Educator  [facility]  Instruction Review Code  1- Actuary and Physiology of the Lungs: - Group verbal and written instruction with the use of models to provide basic lung anatomy and physiology related to function, structure and complications of lung disease.   Anatomy & Physiology of the Heart: - Group verbal and written instruction and models provide basic cardiac anatomy and physiology, with the coronary electrical and arterial systems. Review of Valvular disease and Heart Failure   Pulmonary Rehab from 09/27/2018 in [facility] Cardiac and Pulmonary Rehab  Date  08/23/18  Educator  [facility]  Instruction Review Code  1- Verbalizes Understanding      Cardiac Medications: - Group verbal and written instruction to review commonly prescribed medications for heart disease. Reviews the medication, class of the drug, and side effects.   Pulmonary Rehab from 09/27/2018 in [facility] Cardiac and Pulmonary Rehab  Date  08/30/18  Educator  [facility]  Instruction Review Code  1- Verbalizes Understanding      Know Your Numbers and Risk Factors: -Group verbal and written instruction about important numbers in your health.  Discussion of what are risk factors and how they play a role in the disease process.  Review of Cholesterol, Blood  Pressure, Diabetes, and BMI and the role they play in your overall health.   Pulmonary Rehab from 09/27/2018 in [facility] Cardiac and Pulmonary Rehab  Date  08/07/18  Educator  [facility]  Instruction Review Code  1- Verbalizes Understanding      Sleep Hygiene: -Provides group verbal and written instruction about how sleep can affect your health.  Define sleep hygiene, discuss sleep cycles and impact of sleep habits. Review good sleep hygiene tips.    Pulmonary Rehab from 09/27/2018 in [facility] Cardiac and Pulmonary Rehab  Date  08/28/18  Educator  [facility]  Instruction Review Code  1- Verbalizes Understanding  Other: -Provides group and verbal instruction on various topics (see comments)    Knowledge Questionnaire Score: Knowledge Questionnaire Score - 06/11/18 1239      Knowledge Questionnaire Score   Pre Score  15/18   Reviewed with patient       Core Components/Risk Factors/Patient Goals at Admission: Personal Goals and Risk Factors at Admission - 06/11/18 1255      Core Components/Risk Factors/Patient Goals on Admission    Weight Management  Yes;Weight Maintenance   Feels worse when weight gets too low   Intervention  Weight Management: Develop a combined nutrition and exercise program designed to reach desired caloric intake, while maintaining appropriate intake of nutrient and fiber, sodium and fats, and appropriate energy expenditure required for the weight goal.;Weight Management: Provide education and appropriate resources to help participant work on and attain dietary goals.    Admit Weight  108 lb 8 oz (49.2 kg)    Goal Weight: Short Term  108 lb (49 kg)    Goal Weight: Long Term  108 lb (49 kg)    Expected Outcomes  Short Term: Continue to assess and modify interventions until short term weight is achieved;Long Term: Adherence to nutrition and physical activity/exercise program aimed toward attainment of established weight goal;Weight Maintenance: Understanding of the daily nutrition  guidelines, which includes 25-35% calories from fat, 7% or less cal from saturated fats, less than 218m cholesterol, less than 1.5gm of sodium, & 5 or more servings of fruits and vegetables daily    Improve shortness of breath with ADL's  Yes    Intervention  Provide education, individualized exercise plan and daily activity instruction to help decrease symptoms of SOB with activities of daily living.    Expected Outcomes  Short Term: Improve cardiorespiratory fitness to achieve a reduction of symptoms when performing ADLs;Long Term: Be able to perform more ADLs without symptoms or delay the onset of symptoms    Heart Failure  Yes    Intervention  Provide a combined exercise and nutrition program that is supplemented with education, support and counseling about heart failure. Directed toward relieving symptoms such as shortness of breath, decreased exercise tolerance, and extremity edema.    Expected Outcomes  Improve functional capacity of life;Short term: Attendance in program 2-3 days a week with increased exercise capacity. Reported lower sodium intake. Reported increased fruit and vegetable intake. Reports medication compliance.;Short term: Daily weights obtained and reported for increase. Utilizing diuretic protocols set by physician.;Long term: Adoption of self-care skills and reduction of barriers for early signs and symptoms recognition and intervention leading to self-care maintenance.    Hypertension  Yes    Intervention  Provide education on lifestyle modifcations including regular physical activity/exercise, weight management, moderate sodium restriction and increased consumption of fresh fruit, vegetables, and low fat dairy, alcohol moderation, and smoking cessation.;Monitor prescription use compliance.    Expected Outcomes  Short Term: Continued assessment and intervention until BP is < 140/958mHG in hypertensive participants. < 130/8047mG in hypertensive participants with diabetes, heart  failure or chronic kidney disease.;Long Term: Maintenance of blood pressure at goal levels.    Lipids  Yes    Intervention  Provide education and support for participant on nutrition & aerobic/resistive exercise along with prescribed medications to achieve LDL <58m12mDL >40mg11m Expected Outcomes  Short Term: Participant states understanding of desired cholesterol values and is compliant with medications prescribed. Participant is following exercise prescription and nutrition guidelines.;Long Term: Cholesterol controlled with medications as prescribed,  with individualized exercise RX and with personalized nutrition plan. Value goals: LDL < 31m, HDL > 40 mg.       Core Components/Risk Factors/Patient Goals Review:  Goals and Risk Factor Review    Row Name 06/28/18 1203 08/16/18 1153 09/11/18 1228         Core Components/Risk Factors/Patient Goals Review   Personal Goals Review  Weight Management/Obesity;Heart Failure;Hypertension;Improve shortness of breath with ADL's;Lipids  Weight Management/Obesity;Heart Failure;Hypertension;Improve shortness of breath with ADL's;Lipids  Weight Management/Obesity;Heart Failure;Hypertension;Improve shortness of breath with ADL's;Lipids     Review  Her weight has been holding steady around 107-108lbs.  She is working on her diet and trying to eat good.  She has not had any signs of heart failure and overall her shortness of breath is getting better.  She is doing well with her blood pressure  and she has been checking them at home.    Jeanne Pittman mainly been holding steady with her weight but it did go up a little last week.  She is doing well with her blood pressues and not had any heart failure symptoms.   She feels that her medications are working well and her breathing has improved.   Jeanne Pittman to maintain her weight around 106-107 lbs.  She loves her salt but has been avoiding it.  She has not had any heart failure symptoms.  She admitted to low back around  kidneys and has a follow up appointment next week.  She is still doing well with her pressures and continues to check them at home.  Her breathing is getting better overall.  She is doing well with her meds and still would like to get off of the meds.      Expected Outcomes  Short: Continue to work on weight maintenance and not lose.  Long: Continue to monitor risk factors.   Short: Continue to maintain weight.  Long: Continue to monitor heart failure symptoms.   Short: Continue to montior weight.  Long: Continue to manage heart failure.         Core Components/Risk Factors/Patient Goals at Discharge (Final Review):  Goals and Risk Factor Review - 09/11/18 1228      Core Components/Risk Factors/Patient Goals Review   Personal Goals Review  Weight Management/Obesity;Heart Failure;Hypertension;Improve shortness of breath with ADL's;Lipids    Review  GEulalahcontinues to maintain her weight around 106-107 lbs.  She loves her salt but has been avoiding it.  She has not had any heart failure symptoms.  She admitted to low back around kidneys and has a follow up appointment next week.  She is still doing well with her pressures and continues to check them at home.  Her breathing is getting better overall.  She is doing well with her meds and still would like to get off of the meds.     Expected Outcomes  Short: Continue to montior weight.  Long: Continue to manage heart failure.        ITP Comments: ITP Comments    Row Name 06/11/18 1236 07/08/18 0851 07/23/18 1533 08/05/18 0857 08/16/18 1149   ITP Comments  Medical Evaluation completed. Chart sent for review and changes to Dr. MEmily FilbertDirector of [place] Diagnosis can be found in [facility] encounter 04/19/18  30 day review completed. ITP sent to Dr. MEmily FilbertDirector of [place] Continue with ITP unless changes are made by physician.  GBaker Janushad scheduled eye surgery on 11/22.  Just waiting for clearance to return post  surgery.   30 day review  completed. ITP sent to Dr. Emily Filbert Director of [place]. Continue with ITP unless changes are made by physician.  Olive would like to be done by March to go on a trip with her son to [place].    [place] Name 09/02/18 0854 09/30/18 0845         ITP Comments  30 day review completed. ITP sent to Dr. Emily Filbert Director of [place]. Continue with ITP unless changes are made by physician.  30 day review completed. ITP sent to Dr. Emily Filbert Director of [place]. Continue with ITP unless changes are made by physician.         Comments: 30 day review

## 2018-09-30 NOTE — Progress Notes (Signed)
Daily Session Note  Patient Details  Name: Jeanne Pittman MRN: [id number] Date of Birth: [birth year] Referring Provider:     Pulmonary Rehab from 06/11/2018 in [facility] Cardiac and Pulmonary Rehab  Referring Provider  Kathlyn Sacramento MD      Encounter Date: 09/30/2018  Check In:      Social History   Tobacco Use  Smoking Status Former Smoker  . Types: Cigarettes  . Last attempt to quit: 1950  . Years since quitting: 70.1  Smokeless Tobacco Never Used    Goals Met:  Proper associated with RPD/PD & O2 Sat Independence with exercise equipment Exercise tolerated well Strength training completed today  Goals Unmet:  Not Applicable  Comments: Pt able to follow exercise prescription today without complaint.  Will continue to monitor for progression.    Dr. Emily Filbert is Medical Director for [place] and [facility].

## 2018-10-02 DIAGNOSIS — I5022 Chronic systolic (congestive) heart failure: Secondary | ICD-10-CM | POA: Diagnosis not present

## 2018-10-02 NOTE — Progress Notes (Signed)
Daily Session Note  Patient Details  Name: Jeanne Pittman MRN: [id number] Date of Birth: [birth year] Referring Provider:     Pulmonary Rehab from 06/11/2018 in [facility] Cardiac and Pulmonary Rehab  Referring Provider  Kathlyn Sacramento MD      Encounter Date: 10/02/2018  Check In: Session Check In - 10/02/18 1149      Check-In   Supervising physician immediately available to respond to emergencies  [facility] immediately available ER MD    Physician(s)  Dr. Kerman Passey and Dr. Mariea Clonts    Staff Present  Jasper Loser BS, Exercise Physiologist;Jessica Luan Pulling, MA, RCEP, CCRP, Exercise Physiologist;Joseph Tessie Fass RCP,RRT,BSRT    Medication changes reported      No    Fall or balance concerns reported     No    Warm-up and Cool-down  Performed as group-led instruction    Resistance Training Performed  Yes    VAD Patient?  No    PAD/SET Patient?  No      Pain Assessment   Currently in Pain?  No/denies    Multiple Pain Sites  No          Social History   Tobacco Use  Smoking Status Former Smoker  . Types: Cigarettes  . Last attempt to quit: 1950  . Years since quitting: 70.1  Smokeless Tobacco Never Used    Goals Met:  Proper associated with RPD/PD & O2 Sat Independence with exercise equipment Exercise tolerated well Strength training completed today  Goals Unmet:  Not Applicable  Comments: Pt able to follow exercise prescription today without complaint.  Will continue to monitor for progression.    Dr. Emily Filbert is Medical Director for [place] and [facility].

## 2018-10-04 ENCOUNTER — Encounter: Payer: Medicare Other | Admitting: *Deleted

## 2018-10-04 DIAGNOSIS — I5022 Chronic systolic (congestive) heart failure: Secondary | ICD-10-CM | POA: Diagnosis not present

## 2018-10-04 NOTE — Progress Notes (Signed)
Daily Session Note  Patient Details  Name: Jeanne Pittman MRN: [id number] Date of Birth: [birth year] Referring Provider:     Pulmonary Rehab from 06/11/2018 in [facility] Cardiac and Pulmonary Rehab  Referring Provider  Kathlyn Sacramento MD      Encounter Date: 10/04/2018  Check In: Session Check In - 10/04/18 1223      Check-In   Supervising physician immediately available to respond to emergencies  [facility] immediately available ER MD    Physician(s)  Dr. Cherylann Banas and Clearnce Hasten    Location  [name]-Cardiac & Pulmonary Rehab    Staff Present  Renita Papa, RN BSN;Jessica Luan Pulling, MA, RCEP, CCRP, Exercise Physiologist;Joseph Tessie Fass RCP,RRT,BSRT    Medication changes reported      No    Fall or balance concerns reported     No    Warm-up and Cool-down  Performed as group-led instruction    Resistance Training Performed  Yes    VAD Patient?  No    PAD/SET Patient?  No      Pain Assessment   Currently in Pain?  No/denies          Social History   Tobacco Use  Smoking Status Former Smoker  . Types: Cigarettes  . Last attempt to quit: 1950  . Years since quitting: 70.1  Smokeless Tobacco Never Used    Goals Met:  Proper associated with RPD/PD & O2 Sat Independence with exercise equipment Using PLB without cueing & demonstrates good technique Exercise tolerated well No report of cardiac concerns or symptoms Strength training completed today  Goals Unmet:  Not Applicable  Comments: Pt able to follow exercise prescription today without complaint.  Will continue to monitor for progression.    Dr. Emily Filbert is Medical Director for [place] and [facility].

## 2018-10-07 VITALS — Ht 60.0 in | Wt 107.0 lb

## 2018-10-07 DIAGNOSIS — I5022 Chronic systolic (congestive) heart failure: Secondary | ICD-10-CM

## 2018-10-07 NOTE — Progress Notes (Signed)
Daily Session Note  Patient Details  Name: Jeanne Pittman MRN: [id number] Date of Birth: 10-05-1934 Referring Provider:     Pulmonary Rehab from 06/11/2018 in [facility] Cardiac and Pulmonary Rehab  Referring Provider  Kathlyn Sacramento MD      Encounter Date: 10/07/2018  Check In:      Social History   Tobacco Use  Smoking Status Former Smoker  . Types: Cigarettes  . Last attempt to quit: 1950  . Years since quitting: 70.1  Smokeless Tobacco Never Used    Goals Met:  Proper associated with RPD/PD & O2 Sat Independence with exercise equipment Exercise tolerated well Strength training completed today  Goals Unmet:  Not Applicable  Comments: Pt able to follow exercise prescription today without complaint.  Will continue to monitor for progression.    Dr. Emily Filbert is Medical Director for [place] and [facility].

## 2018-10-09 ENCOUNTER — Encounter: Payer: Medicare Other | Admitting: *Deleted

## 2018-10-09 DIAGNOSIS — I5022 Chronic systolic (congestive) heart failure: Secondary | ICD-10-CM | POA: Diagnosis not present

## 2018-10-09 NOTE — Progress Notes (Signed)
Daily Session Note  Patient Details  Name: DEMARI KROPP MRN: [id number] Date of Birth: [birth year] Referring Provider:     Pulmonary Rehab from 06/11/2018 in [facility] Cardiac and Pulmonary Rehab  Referring Provider  Kathlyn Sacramento MD      Encounter Date: 10/09/2018  Check In: Session Check In - 10/09/18 1128      Check-In   Supervising physician immediately available to respond to emergencies  [facility] immediately available ER MD    Physician(s)  Dr. Joni Fears and Jimmye Norman    Location  [name]-Cardiac & Pulmonary Rehab    Staff Present  Renita Papa, RN BSN;Jessica Luan Pulling, MA, RCEP, CCRP, Exercise Physiologist;Joseph Tessie Fass RCP,RRT,BSRT    Medication changes reported      No    Fall or balance concerns reported     No    Warm-up and Cool-down  Performed as group-led instruction    Resistance Training Performed  Yes    VAD Patient?  No    PAD/SET Patient?  No      Pain Assessment   Currently in Pain?  No/denies          Social History   Tobacco Use  Smoking Status Former Smoker  . Types: Cigarettes  . Last attempt to quit: 1950  . Years since quitting: 70.1  Smokeless Tobacco Never Used    Goals Met:  Proper associated with RPD/PD & O2 Sat Independence with exercise equipment Using PLB without cueing & demonstrates good technique Exercise tolerated well No report of cardiac concerns or symptoms Strength training completed today  Goals Unmet:  Not Applicable  Comments: Pt able to follow exercise prescription today without complaint.  Will continue to monitor for progression.    Dr. Emily Filbert is Medical Director for [place] and [facility].

## 2018-10-14 DIAGNOSIS — I5022 Chronic systolic (congestive) heart failure: Secondary | ICD-10-CM

## 2018-10-14 NOTE — Progress Notes (Signed)
Discharge Progress Report  Patient Details  Name: Jeanne Pittman MRN: [id number] Date of Birth: 02/26/1935 Referring Provider:     Pulmonary Rehab from 06/11/2018 in [facility] Cardiac [facility] Pulmonary Rehab  Referring Provider  Kathlyn Sacramento MD       Number of Visits: 100  Reason for Discharge:  Patient reached a stable level of exercise. Patient independent in their exercise. Patient has met program and personal goals.  Smoking History:  Social History   Tobacco Use  Smoking Status Former Smoker  . Types: Cigarettes  . Last attempt to quit: 1950  . Years since quitting: 70.1  Smokeless Tobacco Never Used    Diagnosis:  Chronic systolic heart failure ([place])  ADL [facility]: Pulmonary Assessment Scores    Row Name 06/11/18 1238 10/07/18 1307       ADL [facility]   ADL Phase  Entry  Exit    SOB Score total  75  71    Rest  1  3    Walk  2  2    Stairs  3  3    Bath  3  3    Dress  3  3    Shop  4  4      CAT Score   CAT Score  22  27      mMRC Score   mMRC Score  2  1       Initial Exercise Prescription: Initial Exercise Prescription - 06/11/18 1400      Date of Initial Exercise RX and Referring Provider   Date  06/11/18    Referring Provider  Kathlyn Sacramento MD      Treadmill   MPH  2.2    Grade  0.5    Minutes  15    METs  2.84      NuStep   Level  4    SPM  80    Minutes  15    METs  2.2      REL-XR   Level  2    Speed  50    Minutes  15    METs  2.2      Prescription Details   Frequency (times per week)  3    Duration  Progress to 45 minutes of aerobic exercise without signs/symptoms of physical distress      Intensity   THRR 40-80% of Max Heartrate  96-125    Ratings of Perceived Exertion  11-13    Perceived Dyspnea  0-4      Progression   Progression  Continue to progress workloads to maintain intensity without signs/symptoms of physical distress.      Resistance Training   Training Prescription  Yes    Weight  3 lbs    Reps  10-15        Discharge Exercise Prescription (Final Exercise Prescription Changes): Exercise Prescription Changes - 10/01/18 1300      Response to Exercise   Blood Pressure (Admit)  110/60    Blood Pressure (Exit)  122/80    Heart Rate (Admit)  82 bpm    Heart Rate (Exercise)  76 bpm    Heart Rate (Exit)  66 bpm    Oxygen Saturation (Admit)  96 %    Oxygen Saturation (Exercise)  88 %    Oxygen Saturation (Exit)  98 %    Rating of Perceived Exertion (Exercise)  12    Perceived Dyspnea (Exercise)  1    Symptoms  none    Duration  Continue with 45 min of aerobic exercise without signs/symptoms of physical distress.    Intensity  THRR unchanged      Progression   Progression  Continue to progress workloads to maintain intensity without signs/symptoms of physical distress.    Average METs  2.81      Resistance Training   Training Prescription  Yes    Weight  3 lbs    Reps  10-15      Interval Training   Interval Training  No      Treadmill   MPH  2.2    Grade  0.5    Minutes  15    METs  2.84      NuStep   Level  5    Minutes  15    METs  2      REL-XR   Level  3    Minutes  15    METs  3.6      Home Exercise Plan   Plans to continue exercise at  Home (comment)   walking   Frequency  Add 2 additional days to program exercise sessions.    Initial Home Exercises Provided  06/26/18       Functional Capacity: 6 Minute Walk    Row Name 06/11/18 1314 10/07/18 1220       6 Minute Walk   Phase  Initial  Discharge    Distance  1170 feet  1195 feet    Distance % Change  -  2 %    Distance Feet Change  -  25 ft    Walk Time  6 minutes  6 minutes    # of Rest Breaks  0  0    MPH  2.22  2.26    METS  2.04  2.14    RPE  11  10    Perceived Dyspnea   1  1    VO2 Peak  7.15  7.48    Symptoms  No  No    Resting HR  68 bpm  75 bpm    Resting BP  122/70  130/70    Resting Oxygen Saturation   99 %  95 %    Exercise Oxygen Saturation  during 6 min walk  94 %  89 %    Max  Ex. HR  86 bpm  86 bpm    Max Ex. BP  124/66  130/82    2 Minute Post BP  128/72  110/80      Interval HR   1 Minute HR  76  78    2 Minute HR  83  82    3 Minute HR  82  86    4 Minute HR  84  85    5 Minute HR  84  82    6 Minute HR  86  86    2 Minute Post HR  75  71    Interval Heart Rate?  Yes  Yes      Interval Oxygen   Interval Oxygen?  Yes  -    Baseline Oxygen Saturation %  99 %  95 %    1 Minute Oxygen Saturation %  95 %  97 %    1 Minute Liters of Oxygen  0 L Room Air  0 L    2 Minute Oxygen Saturation %  95 %  92 %    2 Minute Liters  of Oxygen  0 L  0 L    3 Minute Oxygen Saturation %  94 %  94 %    3 Minute Liters of Oxygen  0 L  0 L    4 Minute Oxygen Saturation %  95 %  91 %    4 Minute Liters of Oxygen  0 L  0 L    5 Minute Oxygen Saturation %  94 %  89 %    5 Minute Liters of Oxygen  0 L  0 L    6 Minute Oxygen Saturation %  95 %  91 %    6 Minute Liters of Oxygen  0 L  0 L    2 Minute Post Oxygen Saturation %  98 %  98 %    2 Minute Post Liters of Oxygen  0 L  0 L       Psychological, QOL, Others - Outcomes: PHQ 2/9: Depression screen [facility] 2/9 10/07/2018 08/19/2018 06/11/2018  Decreased Interest 1 0 1  Down, Depressed, Hopeless 1 0 1  PHQ - 2 Score 2 0 2  Altered sleeping _0 Tired, decreased energy - 1 2  Change in appetite _1 Feeling bad or failure about yourself  2 0 1  Trouble concentrating 0 0 1  Moving slowly or fidgety/restless 0 0 0  Suicidal thoughts 0 0 0  PHQ-9 Score _2 Difficult doing work/chores Not difficult at all Not difficult at all Not difficult at all    Quality of Life:   Personal Goals: Goals established at orientation with interventions provided to work toward goal. Personal Goals and Risk Factors at Admission - 06/11/18 1255      Core Components/Risk Factors/Patient Goals on Admission    Weight Management  Yes;Weight Maintenance   Feels worse when weight gets too low   Intervention  Weight Management:  Develop a combined nutrition and exercise program designed to reach desired caloric intake, while maintaining appropriate intake of nutrient and fiber, sodium and fats, and appropriate energy expenditure required for the weight goal.;Weight Management: Provide education and appropriate resources to help participant work on and attain dietary goals.    Admit Weight  108 lb 8 oz (49.2 kg)    Goal Weight: Short Term  108 lb (49 kg)    Goal Weight: Long Term  108 lb (49 kg)    Expected Outcomes  Short Term: Continue to assess and modify interventions until short term weight is achieved;Long Term: Adherence to nutrition and physical activity/exercise program aimed toward attainment of established weight goal;Weight Maintenance: Understanding of the daily nutrition guidelines, which includes 25-35% calories from fat, 7% or less cal from saturated fats, less than 273m cholesterol, less than 1.5gm of sodium, & 5 or more servings of fruits and vegetables daily    Improve shortness of breath with ADL's  Yes    Intervention  Provide education, individualized exercise plan and daily activity instruction to help decrease symptoms of SOB with activities of daily living.    Expected Outcomes  Short Term: Improve cardiorespiratory fitness to achieve a reduction of symptoms when performing ADLs;Long Term: Be able to perform more ADLs without symptoms or delay the onset of symptoms    Heart Failure  Yes    Intervention  Provide a combined exercise and nutrition program that is supplemented with education, support and counseling about heart failure. Directed toward relieving symptoms such as shortness of breath, decreased exercise  tolerance, and extremity edema.    Expected Outcomes  Improve functional capacity of life;Short term: Attendance in program 2-3 days a week with increased exercise capacity. Reported lower sodium intake. Reported increased fruit and vegetable intake. Reports medication compliance.;Short term:  Daily weights obtained and reported for increase. Utilizing diuretic protocols set by physician.;Long term: Adoption of self-care skills and reduction of barriers for early signs and symptoms recognition and intervention leading to self-care maintenance.    Hypertension  Yes    Intervention  Provide education on lifestyle modifcations including regular physical activity/exercise, weight management, moderate sodium restriction and increased consumption of fresh fruit, vegetables, and low fat dairy, alcohol moderation, and smoking cessation.;Monitor prescription use compliance.    Expected Outcomes  Short Term: Continued assessment and intervention until BP is < 140/94m HG in hypertensive participants. < 130/88mHG in hypertensive participants with diabetes, heart failure or chronic kidney disease.;Long Term: Maintenance of blood pressure at goal levels.    Lipids  Yes    Intervention  Provide education and support for participant on nutrition & aerobic/resistive exercise along with prescribed medications to achieve LDL <7070mHDL >55m72m  Expected Outcomes  Short Term: Participant states understanding of desired cholesterol values and is compliant with medications prescribed. Participant is following exercise prescription and nutrition guidelines.;Long Term: Cholesterol controlled with medications as prescribed, with individualized exercise RX and with personalized nutrition plan. Value goals: LDL < 70mg24mL > 40 mg.        Personal Goals Discharge: Goals and Risk Factor Review    Row Name 06/28/18 1203 08/16/18 1153 09/11/18 1228         Core Components/Risk Factors/Patient Goals Review   Personal Goals Review  Weight Management/Obesity;Heart Failure;Hypertension;Improve shortness of breath with ADL's;Lipids  Weight Management/Obesity;Heart Failure;Hypertension;Improve shortness of breath with ADL's;Lipids  Weight Management/Obesity;Heart Failure;Hypertension;Improve shortness of breath with  ADL's;Lipids     Review  Her weight has been holding steady around 107-108lbs.  She is working on her diet and trying to eat good.  She has not had any signs of heart failure and overall her shortness of breath is getting better.  She is doing well with her blood pressure  and she has been checking them at home.    Shelbia Emmilynmainly been holding steady with her weight but it did go up a little last week.  She is doing well with her blood pressues and not had any heart failure symptoms.   She feels that her medications are working well and her breathing has improved.   Conor Sultanainues to maintain her weight around 106-107 lbs.  She loves her salt but has been avoiding it.  She has not had any heart failure symptoms.  She admitted to low back around kidneys and has a follow up appointment next week.  She is still doing well with her pressures and continues to check them at home.  Her breathing is getting better overall.  She is doing well with her meds and still would like to get off of the meds.      Expected Outcomes  Short: Continue to work on weight maintenance and not lose.  Long: Continue to monitor risk factors.   Short: Continue to maintain weight.  Long: Continue to monitor heart failure symptoms.   Short: Continue to montior weight.  Long: Continue to manage heart failure.         Exercise Goals and Review: Exercise Goals    Row Name 06/11/18 1429  Exercise Goals   Increase Physical Activity  Yes       Intervention  Provide advice, education, support and counseling about physical activity/exercise needs.;Develop an individualized exercise prescription for aerobic and resistive training based on initial evaluation findings, risk stratification, comorbidities and participant's personal goals.       Expected Outcomes  Short Term: Attend rehab on a regular basis to increase amount of physical activity.;Long Term: Add in home exercise to make exercise part of routine and to increase  amount of physical activity.;Long Term: Exercising regularly at least 3-5 days a week.       Increase Strength and Stamina  Yes       Intervention  Provide advice, education, support and counseling about physical activity/exercise needs.;Develop an individualized exercise prescription for aerobic and resistive training based on initial evaluation findings, risk stratification, comorbidities and participant's personal goals.       Expected Outcomes  Short Term: Increase workloads from initial exercise prescription for resistance, speed, and METs.;Short Term: Perform resistance training exercises routinely during rehab and add in resistance training at home;Long Term: Improve cardiorespiratory fitness, muscular endurance and strength as measured by increased METs and functional capacity (6MWT)       Able to understand and use rate of perceived exertion (RPE) scale  Yes       Intervention  Provide education and explanation on how to use RPE scale       Expected Outcomes  Short Term: Able to use RPE daily in rehab to express subjective intensity level;Long Term:  Able to use RPE to guide intensity level when exercising independently       Able to understand and use Dyspnea scale  Yes       Intervention  Provide education and explanation on how to use Dyspnea scale       Expected Outcomes  Short Term: Able to use Dyspnea scale daily in rehab to express subjective sense of shortness of breath during exertion;Long Term: Able to use Dyspnea scale to guide intensity level when exercising independently       Knowledge and understanding of Target Heart Rate Range (THRR)  Yes       Intervention  Provide education and explanation of THRR including how the numbers were predicted and where they are located for reference       Expected Outcomes  Short Term: Able to state/look up THRR;Short Term: Able to use daily as guideline for intensity in rehab;Long Term: Able to use THRR to govern intensity when exercising  independently       Able to check pulse independently  Yes       Intervention  Provide education and demonstration on how to check pulse in carotid and radial arteries.;Review the importance of being able to check your own pulse for safety during independent exercise       Expected Outcomes  Short Term: Able to explain why pulse checking is important during independent exercise;Long Term: Able to check pulse independently and accurately       Understanding of Exercise Prescription  Yes       Intervention  Provide education, explanation, and written materials on patient's individual exercise prescription       Expected Outcomes  Short Term: Able to explain program exercise prescription;Long Term: Able to explain home exercise prescription to exercise independently          Exercise Goals Re-Evaluation: Exercise Goals Re-Evaluation    Row Name 06/14/18 1126 06/17/18 1628 06/25/18 1630 06/26/18  1220 06/28/18 1159     Exercise Goal Re-Evaluation   Exercise Goals Review  -  Increase Physical Activity;Increase Strength and Stamina;Understanding of Exercise Prescription;Able to understand and use rate of perceived exertion (RPE) scale;Knowledge and understanding of Target Heart Rate Range (THRR)  Increase Physical Activity;Increase Strength and Stamina;Understanding of Exercise Prescription  Increase Physical Activity;Increase Strength and Stamina;Able to understand and use rate of perceived exertion (RPE) scale;Knowledge and understanding of Target Heart Rate Range (THRR);Able to check pulse independently;Understanding of Exercise Prescription  Increase Physical Activity;Increase Strength and Stamina;Understanding of Exercise Prescription   Comments  -  Reviewed RPE scale, THR and program prescription with pt today.  Pt voiced understanding and was given a copy of goals to take home.   Kamoni is off to a good start in rehab.  She has been able to pick up the workloads she had gotten up to in [facility].  She has  completed three full days of exercise. We will continue to monitor her progression.   Reviewed home exercise with pt today.  Pt plans to walk at home for exercise.  Reviewed THR, pulse, RPE, sign and symptoms, and when to call 911 or MD.  Also discussed weather considerations and indoor options.  Pt voiced understanding.  Liba is off to a good start in rehab.  We just reviewed home exercise and she is supposed to add it in next week.  She has noticed that exercise has helped increase her strength and stamina.    Expected Outcomes  -  Short: Use RPE daily to regulate intensity. Long: Follow program prescription in THR.  Short: Review home exercise guidelines.  Long: Continue to follow program prescription.   Short: Start to walk more at home.  Long: Continue to exercise more independently.   Short: Start walking at home.  Long: Continue to increase strength and stamina.    Reading Name 07/10/18 1522 07/23/18 1533 08/06/18 1340 08/16/18 1148 08/22/18 1658     Exercise Goal Re-Evaluation   Exercise Goals Review  Increase Physical Activity;Increase Strength and Stamina;Understanding of Exercise Prescription  -  Increase Physical Activity;Increase Strength and Stamina;Understanding of Exercise Prescription  Increase Physical Activity;Increase Strength and Stamina;Understanding of Exercise Prescription  Increase Physical Activity;Increase Strength and Stamina;Able to understand and use rate of perceived exertion (RPE) scale;Able to understand and use Dyspnea scale;Knowledge and understanding of Target Heart Rate Range (THRR);Understanding of Exercise Prescription   Comments  Tyliyah has been doing well in rehab.  She will be out at least through next week as she is having eye surgery on Friday.  She now up to level 5 on the NuStep.  We will continue to monitor her progress.   Out since last review  Keelin returned yesterday after being cleared from her eye surgery.  She was able to come back and do her full workout.  We will  continue to monitor her progress.   Alliah is doing well being back in rehab.  She is doing some of her exercise at home, but has not been doing it during the last two weeks with kids being out of school.  She is feeling stronger and getting her stamina built back up.   Kirsten has maintained levels of exercise since last review.  She attended 4 sessions in December.  Regular attendance will enhance her progress.   Expected Outcomes  Short: Increase XR.  Long: Continue to walk more at home.   -  Short: Get back into swing of class and  then start to increase workloads.  Long; Continue to increase strength and stamina.   Short: Continue to get back into routine of exercise.  Long: Continue to build strength and stamina.   Short - attend 2-3 times per week Long - improve overall MET level   Row Name 09/02/18 1618 09/11/18 1226 09/17/18 1413 10/01/18 1315       Exercise Goal Re-Evaluation   Exercise Goals Review  Increase Physical Activity;Increase Strength and Stamina;Understanding of Exercise Prescription  Increase Physical Activity;Increase Strength and Stamina;Understanding of Exercise Prescription  Increase Physical Activity;Increase Strength and Stamina;Understanding of Exercise Prescription  Increase Physical Activity;Increase Strength and Stamina;Understanding of Exercise Prescription    Comments  Kelani has been doing well in rehab.  She is now up to 4.2 METs on the XR.  We will continue to monitor her progress.   Marcel is doing well in rehab. She is feeling better overall and getting stronger.  Shadi has been doing her ADLs more easily.  She admits to being lazy about her exercise.   Cynia continues to do well in rehab.  She should be ready to increase her XR.  We will continue to monitor her progress.   Audri has been doing well in rehab.  She is already starting to near graduation.  We expect her to improve her post 6MWT.  We will continue to monitor her progress.     Expected Outcomes  Short: Continue to  increase workloads. Long: Continue to improve strength and stamina.   Short: Get back to walking at home.  Long: Continue to increase activity levels.   Short: Increase XR.  Long: Continue to increase activity at home.   Short: Improve post 6MWT.  Long: Continue to increase strength and stamina.        Nutrition & Weight - Outcomes: Pre Biometrics - 06/11/18 1429      Pre Biometrics   Height  5' (1.524 m)    Weight  108 lb 8 oz (49.2 kg)    Waist Circumference  26 inches    Hip Circumference  35 inches    Waist to Hip Ratio  0.74 %    BMI (Calculated)  21.19    Single Leg Stand  21.44 seconds      Post Biometrics - 10/07/18 1220       Post  Biometrics   Height  5' (1.524 m)    Weight  107 lb (48.5 kg)    Waist Circumference  27 inches    Hip Circumference  35 inches    Waist to Hip Ratio  0.77 %    BMI (Calculated)  20.9       Nutrition: Nutrition Therapy & Goals - 08/19/18 1226      Nutrition Therapy   Diet  DASH    Protein (specify units)  6oz    Fiber  20 grams    Whole Grain Foods  3 servings   does not typically choose whole grains   Saturated Fats  11 max. grams    Fruits and Vegetables  4 servings/day   8 ideal; she has a fair-poor appetite   Sodium  1500 grams      Personal Nutrition Goals   Nutrition Goal  D/t recent wt loss, be consistent about drinking your protein shakes daily, eat snacks between meals more regularly, and choose fuller fat options for items like yogurt or cottage cheese    Personal Goal #2  Continue to limit sodium intake and to  include a variety of protein sources    Comments  Pt reports recent wt loss of 8-10# over the past couple of months r/t decreased appetite and depression s/p friend passing away. UBW 115#. However she feels she is slowly regaining her appetite. She "gave up" salt in 2015 and sometimes misses the flavor that salt brings to food. She eats 3 meals/day, with breakfast being her largest meal and lunch and supper being  "light." Breakfast options, egg, bacon, white toast, oatmeal, cereal, grits. Lunch options: [employer] soup occasionally, tuna or chicken salad, vegetables. Dinner options: soup, 1/2 sandwich, vegetables. Snacks: fruit, yogurt, energy bar, protein shake mid morning. Beverages: soda occasionally, water mostly, coffee, hot tea. She does not eat red meat but enjoys other meats like chicken, fish, shrimp regularly. If she buys canned vegetables she rinses them off with water first. She does not eat out often      Intervention Plan   Intervention  Prescribe, educate and counsel regarding individualized specific dietary modifications aiming towards targeted core components such as weight, hypertension, lipid management, diabetes, heart failure and other comorbidities.    Expected Outcomes  Short Term Goal: Understand basic principles of dietary content, such as calories, fat, sodium, cholesterol and nutrients.;Short Term Goal: A plan has been developed with personal nutrition goals set during dietitian appointment.;Long Term Goal: Adherence to prescribed nutrition plan.       Nutrition Discharge: Nutrition Assessments - 10/07/18 1309      MEDFICTS Scores   Pre Score  35    Post Score  24    Score Difference  -11       Education Questionnaire Score: Knowledge Questionnaire Score - 10/07/18 1309      Knowledge Questionnaire Score   Pre Score  15/18    Post Score  16/18   reviewed with patient      Goals reviewed with patient; copy given to patient.

## 2018-10-14 NOTE — Progress Notes (Signed)
Daily Session Note  Patient Details  Name: Jeanne Pittman MRN: [id number] Date of Birth: February 06, 1935 Referring Provider:     Pulmonary Rehab from 06/11/2018 in [facility] Cardiac and Pulmonary Rehab  Referring Provider  Jeanne Sacramento MD      Encounter Date: 10/14/2018  Check In:      Social History   Tobacco Use  Smoking Status Former Smoker  . Types: Cigarettes  . Last attempt to quit: 1950  . Years since quitting: 70.1  Smokeless Tobacco Never Used    Goals Met:  Proper associated with RPD/PD & O2 Sat Independence with exercise equipment Exercise tolerated well Strength training completed today  Goals Unmet:  Not Applicable  Comments:  Jeanne Pittman graduated today from  rehab with 35 sessions completed.  Details of the patient's exercise prescription and what She needs to do in order to continue the prescription and progress were discussed with patient.  Patient was given a copy of prescription and goals.  Patient verbalized understanding.  Jeanne Pittman plans to continue to exercise by walking at home.    Dr. Emily Pittman is Medical Director for [facility].

## 2018-10-14 NOTE — Progress Notes (Signed)
Pulmonary Individual Treatment Plan  Patient Details  Name: ARLENY KRUGER MRN: [id number] Date of Birth: 1935-05-30 Referring Provider:     Pulmonary Rehab from 06/11/2018 in [facility] Cardiac and Pulmonary Rehab  Referring Provider  Kathlyn Sacramento MD      Initial Encounter Date:    Pulmonary Rehab from 06/11/2018 in [facility] Cardiac and Pulmonary Rehab  Date  06/11/18      Visit Diagnosis: Chronic systolic heart failure ([place])  Patient's Home Medications on Admission:  Current Outpatient Medications:  .  acetaminophen (TYLENOL) 650 MG CR tablet, Take 650 mg by mouth every 8 (eight) hours as needed for pain., Disp: , Rfl:  .  aspirin 81 MG tablet, Take 81 mg by mouth every morning. , Disp: , Rfl:  .  carvedilol (COREG) 3.125 MG tablet, TAKE 1 TABLET BY MOUTH TWICE DAILY WITH A MEAL, Disp: 60 tablet, Rfl: 6 .  Cholecalciferol (VITAMIN D-3) 5000 UNITS TABS, Take 2,000 Int'l Units by mouth every morning. , Disp: , Rfl:  .  fexofenadine (ALLEGRA) 180 MG tablet, Take 180 mg by mouth daily as needed for rhinitis. , Disp: , Rfl:  .  Fluocinolone Acetonide Scalp 0.01 % OIL, as needed., Disp: , Rfl:  .  furosemide (LASIX) 20 MG tablet, Take 1 tablet (20 mg total) by mouth daily. (Patient taking differently: Take 20 mg by mouth daily as needed. ), Disp: 30 tablet, Rfl: 6 .  gabapentin (NEURONTIN) 300 MG capsule, Take 300 mg by mouth as needed., Disp: , Rfl:  .  latanoprost (XALATAN) 0.005 % ophthalmic solution, Place 1 drop into both eyes at bedtime. , Disp: , Rfl:  .  LORazepam (ATIVAN) 0.5 MG tablet, Take 0.5 mg by mouth every 8 (eight) hours as needed for anxiety. , Disp: , Rfl:  .  losartan (COZAAR) 25 MG tablet, TAKE 1/2 TABLET(12.5 MG) BY MOUTH DAILY AFTER BREAKFAST (Patient taking differently: Take 25 mg by mouth daily. ), Disp: 15 tablet, Rfl: 8 .  mupirocin ointment (BACTROBAN) 2 %, Apply topically as needed., Disp: , Rfl:  .  oxybutynin (DITROPAN-XL) 10 MG 24 hr tablet, Take 1 tablet (10 mg  total) by mouth daily. (Patient not taking: Reported on 06/11/2018), Disp: 30 tablet, Rfl: 3 .  pantoprazole (PROTONIX) 40 MG tablet, TAKE 1 TABLET(40 MG) BY MOUTH TWICE DAILY, Disp: 60 tablet, Rfl: 3 .  rosuvastatin (CRESTOR) 10 MG tablet, TAKE 1 TABLET(10 MG) BY MOUTH EVERY OTHER DAY, Disp: 45 tablet, Rfl: 2 .  timolol (TIMOPTIC) 0.5 % ophthalmic solution, Place 1 drop into both eyes 2 (two) times daily. , Disp: , Rfl:  .  triamcinolone ointment (KENALOG) 0.1 %, as needed., Disp: , Rfl:   Past Medical History: Past Medical History:  Diagnosis Date  . A-fib ([place])    07/2012: A. fib with RVR in the setting of myocardial infarction. Converted to sinus rhythm with amiodarone. No recurrence.  . Anxiety   . Atrophic vaginitis   . Chronic combined systolic (congestive) and diastolic (congestive) heart failure ([place])    a. EF was 25-35% post MI but improved to 45-50% in 04/2013; b. 03/2017 Echo: EF 40-45%, mod focal/basal hypertrophy of septum. Sev mid-apicalanteroseptal, ant, and apical HK. Gr1 DD, mild AI/MR, mod TR, PASP 40mg.  . Colon adenomas   . Coronary artery disease    a. 133/8250STEMI complicated by cardiogenic shock. Cath/PCI: LAD [id number]), RCA 95p(DES). EF 25%; b. 03/2017 MV: EF 57%, fixed apical, periapical, mid to distal anteroseptal defect. No ischemia.  .[name]  Dysrhythmia    A-fib  . Fibrocystic breast disease   . GERD (gastroesophageal reflux disease)   . Glaucoma   . History of gastritis   . Hyperlipidemia   . Hypertension   . Insomnia   . Ischemic cardiomyopathy    a. 07/2012 EF 25-35% following MI-->Improved to 45-50%;  b. 03/2017 Echo: EF 40-45%.  . Myocardial infarction ([place]) 08/18/12  . Non Hodgkin's lymphoma ([place]) 2005   reoccurance 2007 and 2012  . Osteoarthritis   . Polyneuropathy   . Polyposis of colon   . Restless leg syndrome   . Urinary, incontinence, stress female     Tobacco Use: Social History   Tobacco Use  Smoking Status Former Smoker  . Types:  Cigarettes  . Last attempt to quit: 1950  . Years since quitting: 70.1  Smokeless Tobacco Never Used    Labs: Recent Chemical engineer    Labs for ITP Cardiac and Pulmonary Rehab Latest Ref Rng & Units 04/07/2013 09/02/2015   Cholestrol 100 - 199 mg/dL 136 172   LDLCALC 0 - 99 mg/dL 58 99   HDL >39 mg/dL 54 51   Trlycerides 0 - 149 mg/dL 120 111       Pulmonary Assessment Scores: Pulmonary Assessment Scores    Row Name 06/11/18 1238 10/07/18 1307       ADL UCSD   ADL Phase  Entry  Exit    SOB Score total  75  71    Rest  1  3    Walk  2  2    Stairs  3  3    Bath  3  3    Dress  3  3    Shop  4  4      CAT Score   CAT Score  22  27      mMRC Score   mMRC Score  2  1       Pulmonary Function Assessment:   Exercise Target Goals: Exercise Program Goal: Individual exercise prescription set using results from initial 6 min walk test and THRR while considering  patient's activity barriers and safety.   Exercise Prescription Goal: Initial exercise prescription builds to 30-45 minutes a day of aerobic activity, 2-3 days per week.  Home exercise guidelines will be given to patient during program as part of exercise prescription that the participant will acknowledge.  Activity Barriers & Risk Stratification: Activity Barriers & Cardiac Risk Stratification - 06/11/18 1320      Activity Barriers & Cardiac Risk Stratification   Activity Barriers  Decreased Ventricular Function;Deconditioning;Muscular Weakness;Shortness of Breath       6 Minute Walk: 6 Minute Walk    Row Name 06/11/18 1314 10/07/18 1220       6 Minute Walk   Phase  Initial  Discharge    Distance  1170 feet  1195 feet    Distance % Change  -  2 %    Distance Feet Change  -  25 ft    Walk Time  6 minutes  6 minutes    # of Rest Breaks  0  0    MPH  2.22  2.26    METS  2.04  2.14    RPE  11  10    Perceived Dyspnea   1  1    VO2 Peak  7.15  7.48    Symptoms  No  No    Resting HR  68 bpm  75 bpm    Resting BP  122/70  130/70    Resting Oxygen Saturation   99 %  95 %    Exercise Oxygen Saturation  during 6 min walk  94 %  89 %    Max Ex. HR  86 bpm  86 bpm    Max Ex. BP  124/66  130/82    2 Minute Post BP  128/72  110/80      Interval HR   1 Minute HR  76  78    2 Minute HR  83  82    3 Minute HR  82  86    4 Minute HR  84  85    5 Minute HR  84  82    6 Minute HR  86  86    2 Minute Post HR  75  71    Interval Heart Rate?  Yes  Yes      Interval Oxygen   Interval Oxygen?  Yes  -    Baseline Oxygen Saturation %  99 %  95 %    1 Minute Oxygen Saturation %  95 %  97 %    1 Minute Liters of Oxygen  0 L Room Air  0 L    2 Minute Oxygen Saturation %  95 %  92 %    2 Minute Liters of Oxygen  0 L  0 L    3 Minute Oxygen Saturation %  94 %  94 %    3 Minute Liters of Oxygen  0 L  0 L    4 Minute Oxygen Saturation %  95 %  91 %    4 Minute Liters of Oxygen  0 L  0 L    5 Minute Oxygen Saturation %  94 %  89 %    5 Minute Liters of Oxygen  0 L  0 L    6 Minute Oxygen Saturation %  95 %  91 %    6 Minute Liters of Oxygen  0 L  0 L    2 Minute Post Oxygen Saturation %  98 %  98 %    2 Minute Post Liters of Oxygen  0 L  0 L      Oxygen Initial Assessment: Oxygen Initial Assessment - 06/11/18 1250      Home Oxygen   Home Oxygen Device  None    Sleep Oxygen Prescription  None    Home Exercise Oxygen Prescription  None    Home at Rest Exercise Oxygen Prescription  None      Initial 6 min Walk   Oxygen Used  None      Program Oxygen Prescription   Program Oxygen Prescription  None      Intervention   Short Term Goals  To learn and understand importance of monitoring SPO2 with pulse oximeter and demonstrate accurate use of the pulse oximeter.;To learn and understand importance of maintaining oxygen saturations>88%;To learn and demonstrate proper pursed lip breathing techniques or other breathing techniques.    Long  Term Goals  Verbalizes importance of monitoring  SPO2 with pulse oximeter and return demonstration;Maintenance of O2 saturations>88%;Exhibits proper breathing techniques, such as pursed lip breathing or other method taught during program session       Oxygen Re-Evaluation: Oxygen Re-Evaluation    Row Name 06/14/18 1127 06/17/18 1628 06/28/18 1205 08/16/18 1155 09/11/18 1236     Program Oxygen Prescription   Program Oxygen  Prescription  -  -  None  None  None     Home Oxygen   Home Oxygen Device  -  -  None  None  None   Sleep Oxygen Prescription  -  -  None  None  None   Home Exercise Oxygen Prescription  -  -  None  None  None   Home at Rest Exercise Oxygen Prescription  -  -  None  None  None     Goals/Expected Outcomes   Short Term Goals  -  To learn and understand importance of monitoring SPO2 with pulse oximeter and demonstrate accurate use of the pulse oximeter.;To learn and understand importance of maintaining oxygen saturations>88%;To learn and demonstrate proper pursed lip breathing techniques or other breathing techniques.  To learn and understand importance of monitoring SPO2 with pulse oximeter and demonstrate accurate use of the pulse oximeter.;To learn and understand importance of maintaining oxygen saturations>88%;To learn and demonstrate proper pursed lip breathing techniques or other breathing techniques.  To learn and understand importance of monitoring SPO2 with pulse oximeter and demonstrate accurate use of the pulse oximeter.;To learn and understand importance of maintaining oxygen saturations>88%;To learn and demonstrate proper pursed lip breathing techniques or other breathing techniques.  To learn and understand importance of monitoring SPO2 with pulse oximeter and demonstrate accurate use of the pulse oximeter.;To learn and understand importance of maintaining oxygen saturations>88%;To learn and demonstrate proper pursed lip breathing techniques or other breathing techniques.   Long  Term Goals  -  Verbalizes  importance of monitoring SPO2 with pulse oximeter and return demonstration;Maintenance of O2 saturations>88%;Exhibits proper breathing techniques, such as pursed lip breathing or other method taught during program session  Verbalizes importance of monitoring SPO2 with pulse oximeter and return demonstration;Maintenance of O2 saturations>88%;Exhibits proper breathing techniques, such as pursed lip breathing or other method taught during program session  Verbalizes importance of monitoring SPO2 with pulse oximeter and return demonstration;Maintenance of O2 saturations>88%;Exhibits proper breathing techniques, such as pursed lip breathing or other method taught during program session  Verbalizes importance of monitoring SPO2 with pulse oximeter and return demonstration;Maintenance of O2 saturations>88%;Exhibits proper breathing techniques, such as pursed lip breathing or other method taught during program session   Comments  -  Reviewed PLB technique with pt.  Talked about how it work and it's important to maintaining his exercise saturations.    Lequisha is off to a good start in rehab.  She is doing better with her breathing.  She has not gotten a pulse oximeter yet, but is still planning to.   She has been doing well with her PLB and finds it helpful when she is SOB.  She will sit down and use her PLB to recover.    Paulett is doing well in rehab.  She uses her PLB routinely and has found it to be helpful when she wakes at night to help her get back to sleep. She has not gotten a new pulse oximeter yet.    Sidnie is doing well in rehab. She is able to do more at home with less SOB.  She is doing well with her PLB.  She still uses it to help her relax as well. She still has not gotten the pulse oximeter and has not found her old one.    Goals/Expected Outcomes  -  Short: Become more profiecient at using PLB.   Long: Become independent at using PLB.  Short: Get pulse oximeter  Long: Continue to  work on using PLB.   Short:  Get pulse oximeter.  Long: Continue to use PLB routinely.   Short; Get a new pulse oximeter  Long: Continue to use PLB consistently.       Oxygen Discharge (Final Oxygen Re-Evaluation): Oxygen Re-Evaluation - 09/11/18 1236      Program Oxygen Prescription   Program Oxygen Prescription  None      Home Oxygen   Home Oxygen Device  None    Sleep Oxygen Prescription  None    Home Exercise Oxygen Prescription  None    Home at Rest Exercise Oxygen Prescription  None      Goals/Expected Outcomes   Short Term Goals  To learn and understand importance of monitoring SPO2 with pulse oximeter and demonstrate accurate use of the pulse oximeter.;To learn and understand importance of maintaining oxygen saturations>88%;To learn and demonstrate proper pursed lip breathing techniques or other breathing techniques.    Long  Term Goals  Verbalizes importance of monitoring SPO2 with pulse oximeter and return demonstration;Maintenance of O2 saturations>88%;Exhibits proper breathing techniques, such as pursed lip breathing or other method taught during program session    Comments  Allizon is doing well in rehab. She is able to do more at home with less SOB.  She is doing well with her PLB.  She still uses it to help her relax as well. She still has not gotten the pulse oximeter and has not found her old one.     Goals/Expected Outcomes  Short; Get a new pulse oximeter  Long: Continue to use PLB consistently.        Initial Exercise Prescription: Initial Exercise Prescription - 06/11/18 1400      Date of Initial Exercise RX and Referring Provider   Date  06/11/18    Referring Provider  Kathlyn Sacramento MD      Treadmill   MPH  2.2    Grade  0.5    Minutes  15    METs  2.84      NuStep   Level  4    SPM  80    Minutes  15    METs  2.2      REL-XR   Level  2    Speed  50    Minutes  15    METs  2.2      Prescription Details   Frequency (times per week)  3    Duration  Progress to 45 minutes of  aerobic exercise without signs/symptoms of physical distress      Intensity   THRR 40-80% of Max Heartrate  96-125    Ratings of Perceived Exertion  11-13    Perceived Dyspnea  0-4      Progression   Progression  Continue to progress workloads to maintain intensity without signs/symptoms of physical distress.      Resistance Training   Training Prescription  Yes    Weight  3 lbs    Reps  10-15       Perform Capillary Blood Glucose checks as needed.  Exercise Prescription Changes: Exercise Prescription Changes    Row Name 06/11/18 1400 06/25/18 1600 06/26/18 1200 07/10/18 1500 08/06/18 1300     Response to Exercise   Blood Pressure (Admit)  122/70  124/60  -  128/78  122/64   Blood Pressure (Exercise)  124/66  134/64  -  -  -   Blood Pressure (Exit)  128/72  110/62  -  100/54  122/80  Heart Rate (Admit)  68 bpm  109 bpm  -  82 bpm  92 bpm   Heart Rate (Exercise)  86 bpm  100 bpm  -  99 bpm  94 bpm   Heart Rate (Exit)  75 bpm  77 bpm  -  73 bpm  71 bpm   Oxygen Saturation (Admit)  99 %  92 %  -  100 %  99 %   Oxygen Saturation (Exercise)  94 %  95 %  -  93 %  91 %   Oxygen Saturation (Exit)  98 %  98 %  -  98 %  93 %   Rating of Perceived Exertion (Exercise)  11  13  -  15  13   Perceived Dyspnea (Exercise)  1  1  -  2  0   Symptoms  none  none  -  none  none   Comments  walk test results  -  -  -  -   Duration  -  Continue with 45 min of aerobic exercise without signs/symptoms of physical distress.  -  Continue with 45 min of aerobic exercise without signs/symptoms of physical distress.  Continue with 45 min of aerobic exercise without signs/symptoms of physical distress.   Intensity  -  THRR unchanged  -  THRR unchanged  THRR unchanged     Progression   Progression  -  Continue to progress workloads to maintain intensity without signs/symptoms of physical distress.  -  Continue to progress workloads to maintain intensity without signs/symptoms of physical distress.  Continue  to progress workloads to maintain intensity without signs/symptoms of physical distress.   Average METs  -  3.61  -  2.94  3.11     Resistance Training   Training Prescription  -  Yes  -  Yes  Yes   Weight  -  3 lbs  -  3 lbs  3 lbs   Reps  -  10-15  -  10-15  10-15     Interval Training   Interval Training  -  No  -  No  No     Treadmill   MPH  -  2.2  -  2.2  2.2   Grade  -  0.5  -  0.5  0.5   Minutes  -  15  -  1  15   METs  -  2.84  -  2.84  2.84     NuStep   Level  -  4  -  5  5   Minutes  -  15  -  15  15   METs  -  3  -  2.4  2.2     REL-XR   Level  -  2  -  2  2   Minutes  -  15  -  15  15   METs  -  5  -  3.6  4.3     Home Exercise Plan   Plans to continue exercise at  -  -  Home (comment) walking  Home (comment) walking  Home (comment) walking   Frequency  -  -  Add 1 additional day to program exercise sessions.  Add 1 additional day to program exercise sessions.  Add 1 additional day to program exercise sessions.   Initial Home Exercises Provided  -  -  06/26/18  06/26/18  06/26/18   Row Name 08/22/18 1600  09/02/18 1600 09/17/18 1400 10/01/18 1300       Response to Exercise   Blood Pressure (Admit)  120/68  122/80  122/66  110/60    Blood Pressure (Exit)  116/64  126/60  110/60  122/80    Heart Rate (Admit)  69 bpm  78 bpm  96 bpm  82 bpm    Heart Rate (Exercise)  83 bpm  90 bpm  100 bpm  76 bpm    Heart Rate (Exit)  68 bpm  79 bpm  72 bpm  66 bpm    Oxygen Saturation (Admit)  97 %  99 %  91 %  96 %    Oxygen Saturation (Exercise)  95 %  95 %  98 %  88 %    Oxygen Saturation (Exit)  98 %  97 %  95 %  98 %    Rating of Perceived Exertion (Exercise)  _0 Perceived Dyspnea (Exercise)  _1 Symptoms  none  none  none  none    Duration  Continue with 45 min of aerobic exercise without signs/symptoms of physical distress.  Continue with 45 min of aerobic exercise without signs/symptoms of physical distress.  Continue with 45 min of aerobic  exercise without signs/symptoms of physical distress.  Continue with 45 min of aerobic exercise without signs/symptoms of physical distress.    Intensity  THRR unchanged  THRR unchanged  THRR unchanged  THRR unchanged      Progression   Progression  Continue to progress workloads to maintain intensity without signs/symptoms of physical distress.  Continue to progress workloads to maintain intensity without signs/symptoms of physical distress.  Continue to progress workloads to maintain intensity without signs/symptoms of physical distress.  Continue to progress workloads to maintain intensity without signs/symptoms of physical distress.    Average METs  2.7  3.18  2.73  2.81      Resistance Training   Training Prescription  Yes  Yes  Yes  Yes    Weight  3 lb  3 lbs  3 lbs  3 lbs    Reps  10-15  10-15  10-15  10-15      Interval Training   Interval Training  No  No  No  No      Treadmill   MPH  2.2  2.2  2.5  2.2    Grade  0.5  0.5  0.5  0.5    Minutes  _2 METs  2.84  2.84  3.09  2.84      NuStep   Level  _3 SPM  80  -  -  -    Minutes  _4 METs  2.2  2.5  2.6  2      REL-XR   Level  _5 Speed  50  -  -  -    Minutes  _6 METs  3.1  4.2  2.6  3.6      Home Exercise Plan   Plans to continue exercise at  Home (comment) walking  Home (comment) walking  Home (comment) walking  Home (comment) walking    Frequency  Add 1 additional day to program exercise sessions.  Add 1 additional day to program exercise sessions.  Add 1 additional day to program exercise sessions.  Add 2 additional days to program exercise sessions.    Initial Home Exercises Provided  06/26/18  06/26/18  06/26/18  06/26/18       Exercise Comments: Exercise Comments    Row Name 06/14/18 1126 06/25/18 1627         Exercise Comments  -  First full day of exercise was 06/17/18 !  Patient was oriented to gym and equipment including functions,  settings, policies, and procedures.  Patient's individual exercise prescription and treatment plan were reviewed.  All starting workloads were established based on the results of the 6 minute walk test done at initial orientation visit.  The plan for exercise progression was also introduced and progression will be customized based on patient's performance and goals.         Exercise Goals and Review: Exercise Goals    Row Name 06/11/18 1429             Exercise Goals   Increase Physical Activity  Yes       Intervention  Provide advice, education, support and counseling about physical activity/exercise needs.;Develop an individualized exercise prescription for aerobic and resistive training based on initial evaluation findings, risk stratification, comorbidities and participant's personal goals.       Expected Outcomes  Short Term: Attend rehab on a regular basis to increase amount of physical activity.;Long Term: Add in home exercise to make exercise part of routine and to increase amount of physical activity.;Long Term: Exercising regularly at least 3-5 days a week.       Increase Strength and Stamina  Yes       Intervention  Provide advice, education, support and counseling about physical activity/exercise needs.;Develop an individualized exercise prescription for aerobic and resistive training based on initial evaluation findings, risk stratification, comorbidities and participant's personal goals.       Expected Outcomes  Short Term: Increase workloads from initial exercise prescription for resistance, speed, and METs.;Short Term: Perform resistance training exercises routinely during rehab and add in resistance training at home;Long Term: Improve cardiorespiratory fitness, muscular endurance and strength as measured by increased METs and functional capacity (6MWT)       Able to understand and use rate of perceived exertion (RPE) scale  Yes       Intervention  Provide education and  explanation on how to use RPE scale       Expected Outcomes  Short Term: Able to use RPE daily in rehab to express subjective intensity level;Long Term:  Able to use RPE to guide intensity level when exercising independently       Able to understand and use Dyspnea scale  Yes       Intervention  Provide education and explanation on how to use Dyspnea scale       Expected Outcomes  Short Term: Able to use Dyspnea scale daily in rehab to express subjective sense of shortness of breath during exertion;Long Term: Able to use Dyspnea scale to guide intensity level when exercising independently       Knowledge and understanding of Target Heart Rate Range (THRR)  Yes       Intervention  Provide education and explanation of THRR including how the numbers were predicted and where they are located for reference       Expected Outcomes  Short Term: Able to state/look up  THRR;Short Term: Able to use daily as guideline for intensity in rehab;Long Term: Able to use THRR to govern intensity when exercising independently       Able to check pulse independently  Yes       Intervention  Provide education and demonstration on how to check pulse in carotid and radial arteries.;Review the importance of being able to check your own pulse for safety during independent exercise       Expected Outcomes  Short Term: Able to explain why pulse checking is important during independent exercise;Long Term: Able to check pulse independently and accurately       Understanding of Exercise Prescription  Yes       Intervention  Provide education, explanation, and written materials on patient's individual exercise prescription       Expected Outcomes  Short Term: Able to explain program exercise prescription;Long Term: Able to explain home exercise prescription to exercise independently          Exercise Goals Re-Evaluation : Exercise Goals Re-Evaluation    Row Name 06/14/18 1126 06/17/18 1628 06/25/18 1630 06/26/18 1220 06/28/18  1159     Exercise Goal Re-Evaluation   Exercise Goals Review  -  Increase Physical Activity;Increase Strength and Stamina;Understanding of Exercise Prescription;Able to understand and use rate of perceived exertion (RPE) scale;Knowledge and understanding of Target Heart Rate Range (THRR)  Increase Physical Activity;Increase Strength and Stamina;Understanding of Exercise Prescription  Increase Physical Activity;Increase Strength and Stamina;Able to understand and use rate of perceived exertion (RPE) scale;Knowledge and understanding of Target Heart Rate Range (THRR);Able to check pulse independently;Understanding of Exercise Prescription  Increase Physical Activity;Increase Strength and Stamina;Understanding of Exercise Prescription   Comments  -  Reviewed RPE scale, THR and program prescription with pt today.  Pt voiced understanding and was given a copy of goals to take home.   Corrie is off to a good start in rehab.  She has been able to pick up the workloads she had gotten up to in [facility].  She has completed three full days of exercise. We will continue to monitor her progression.   Reviewed home exercise with pt today.  Pt plans to walk at home for exercise.  Reviewed THR, pulse, RPE, sign and symptoms, and when to call 911 or MD.  Also discussed weather considerations and indoor options.  Pt voiced understanding.  Phaedra is off to a good start in rehab.  We just reviewed home exercise and she is supposed to add it in next week.  She has noticed that exercise has helped increase her strength and stamina.    Expected Outcomes  -  Short: Use RPE daily to regulate intensity. Long: Follow program prescription in THR.  Short: Review home exercise guidelines.  Long: Continue to follow program prescription.   Short: Start to walk more at home.  Long: Continue to exercise more independently.   Short: Start walking at home.  Long: Continue to increase strength and stamina.    [place] Name 07/10/18 1522 07/23/18 1533 08/06/18  1340 08/16/18 1148 08/22/18 1658     Exercise Goal Re-Evaluation   Exercise Goals Review  Increase Physical Activity;Increase Strength and Stamina;Understanding of Exercise Prescription  -  Increase Physical Activity;Increase Strength and Stamina;Understanding of Exercise Prescription  Increase Physical Activity;Increase Strength and Stamina;Understanding of Exercise Prescription  Increase Physical Activity;Increase Strength and Stamina;Able to understand and use rate of perceived exertion (RPE) scale;Able to understand and use Dyspnea scale;Knowledge and understanding of Target Heart Rate Range (THRR);Understanding  of Exercise Prescription   Comments  Andrika has been doing well in rehab.  She will be out at least through next week as she is having eye surgery on Friday.  She now up to level 5 on the NuStep.  We will continue to monitor her progress.   Out since last review  Takiah returned yesterday after being cleared from her eye surgery.  She was able to come back and do her full workout.  We will continue to monitor her progress.   Imajean is doing well being back in rehab.  She is doing some of her exercise at home, but has not been doing it during the last two weeks with kids being out of school.  She is feeling stronger and getting her stamina built back up.   Sapphire has maintained levels of exercise since last review.  She attended 4 sessions in December.  Regular attendance will enhance her progress.   Expected Outcomes  Short: Increase XR.  Long: Continue to walk more at home.   -  Short: Get back into swing of class and then start to increase workloads.  Long; Continue to increase strength and stamina.   Short: Continue to get back into routine of exercise.  Long: Continue to build strength and stamina.   Short - attend 2-3 times per week Long - improve overall MET level   [place] Name 09/02/18 1618 09/11/18 1226 09/17/18 1413 10/01/18 1315       Exercise Goal Re-Evaluation   Exercise Goals Review  Increase  Physical Activity;Increase Strength and Stamina;Understanding of Exercise Prescription  Increase Physical Activity;Increase Strength and Stamina;Understanding of Exercise Prescription  Increase Physical Activity;Increase Strength and Stamina;Understanding of Exercise Prescription  Increase Physical Activity;Increase Strength and Stamina;Understanding of Exercise Prescription    Comments  Sterling has been doing well in rehab.  She is now up to 4.2 METs on the XR.  We will continue to monitor her progress.   Lira is doing well in rehab. She is feeling better overall and getting stronger.  Merrit has been doing her ADLs more easily.  She admits to being lazy about her exercise.   Nya continues to do well in rehab.  She should be ready to increase her XR.  We will continue to monitor her progress.   Shauna has been doing well in rehab.  She is already starting to near graduation.  We expect her to improve her post 6MWT.  We will continue to monitor her progress.     Expected Outcomes  Short: Continue to increase workloads. Long: Continue to improve strength and stamina.   Short: Get back to walking at home.  Long: Continue to increase activity levels.   Short: Increase XR.  Long: Continue to increase activity at home.   Short: Improve post 6MWT.  Long: Continue to increase strength and stamina.        Discharge Exercise Prescription (Final Exercise Prescription Changes): Exercise Prescription Changes - 10/01/18 1300      Response to Exercise   Blood Pressure (Admit)  110/60    Blood Pressure (Exit)  122/80    Heart Rate (Admit)  82 bpm    Heart Rate (Exercise)  76 bpm    Heart Rate (Exit)  66 bpm    Oxygen Saturation (Admit)  96 %    Oxygen Saturation (Exercise)  88 %    Oxygen Saturation (Exit)  98 %    Rating of Perceived Exertion (Exercise)  12  Perceived Dyspnea (Exercise)  1    Symptoms  none    Duration  Continue with 45 min of aerobic exercise without signs/symptoms of physical distress.     Intensity  THRR unchanged      Progression   Progression  Continue to progress workloads to maintain intensity without signs/symptoms of physical distress.    Average METs  2.81      Resistance Training   Training Prescription  Yes    Weight  3 lbs    Reps  10-15      Interval Training   Interval Training  No      Treadmill   MPH  2.2    Grade  0.5    Minutes  15    METs  2.84      NuStep   Level  5    Minutes  15    METs  2      REL-XR   Level  3    Minutes  15    METs  3.6      Home Exercise Plan   Plans to continue exercise at  Home (comment)   walking   Frequency  Add 2 additional days to program exercise sessions.    Initial Home Exercises Provided  06/26/18       Nutrition:  Target Goals: Understanding of nutrition guidelines, daily intake of sodium <1563m, cholesterol <2048m calories 30% from fat and 7% or less from saturated fats, daily to have 5 or more servings of fruits and vegetables.  Biometrics: Pre Biometrics - 06/11/18 1429      Pre Biometrics   Height  5' (1.524 m)    Weight  108 lb 8 oz (49.2 kg)    Waist Circumference  26 inches    Hip Circumference  35 inches    Waist to Hip Ratio  0.74 %    BMI (Calculated)  21.19    Single Leg Stand  21.44 seconds      Post Biometrics - 10/07/18 1220       Post  Biometrics   Height  5' (1.524 m)    Weight  107 lb (48.5 kg)    Waist Circumference  27 inches    Hip Circumference  35 inches    Waist to Hip Ratio  0.77 %    BMI (Calculated)  20.9       Nutrition Therapy Plan and Nutrition Goals: Nutrition Therapy & Goals - 08/19/18 1226      Nutrition Therapy   Diet  DASH    Protein (specify units)  6oz    Fiber  20 grams    Whole Grain Foods  3 servings   does not typically choose whole grains   Saturated Fats  11 max. grams    Fruits and Vegetables  4 servings/day   8 ideal; she has a fair-poor appetite   Sodium  1500 grams      Personal Nutrition Goals   Nutrition Goal  D/t recent  wt loss, be consistent about drinking your protein shakes daily, eat snacks between meals more regularly, and choose fuller fat options for items like yogurt or cottage cheese    Personal Goal #2  Continue to limit sodium intake and to include a variety of protein sources    Comments  Pt reports recent wt loss of 8-10# over the past couple of months r/t decreased appetite and depression s/p friend passing away. UBW 115#. However she feels she is slowly  regaining her appetite. She "gave up" salt in 2015 and sometimes misses the flavor that salt brings to food. She eats 3 meals/day, with breakfast being her largest meal and lunch and supper being "light." Breakfast options, egg, bacon, white toast, oatmeal, cereal, grits. Lunch options: [employer] soup occasionally, tuna or chicken salad, vegetables. Dinner options: soup, 1/2 sandwich, vegetables. Snacks: fruit, yogurt, energy bar, protein shake mid morning. Beverages: soda occasionally, water mostly, coffee, hot tea. She does not eat red meat but enjoys other meats like chicken, fish, shrimp regularly. If she buys canned vegetables she rinses them off with water first. She does not eat out often      Intervention Plan   Intervention  Prescribe, educate and counsel regarding individualized specific dietary modifications aiming towards targeted core components such as weight, hypertension, lipid management, diabetes, heart failure and other comorbidities.    Expected Outcomes  Short Term Goal: Understand basic principles of dietary content, such as calories, fat, sodium, cholesterol and nutrients.;Short Term Goal: A plan has been developed with personal nutrition goals set during dietitian appointment.;Long Term Goal: Adherence to prescribed nutrition plan.       Nutrition Assessments: Nutrition Assessments - 10/07/18 1309      MEDFICTS Scores   Pre Score  35    Post Score  24    Score Difference  -11       Nutrition Goals  Re-Evaluation: Nutrition Goals Re-Evaluation    [place] Name 08/19/18 1246 09/11/18 1233           Goals   Nutrition Goal  D/t recent wt loss, be consistent about drinking your protein shakes daily, eat snacks between meals more regularly, and choose fuller fat options for items like yogurt or cottage cheese  More consistent with protein shakes, regular meals and snacks, small portions, limit sodium      Comment  She has lost 8-10# from her UBW r/t depression and lack of appetite, though she feels it is back on the up-swing. She eats small portions particularly for lunch and supper  Nyrah has been doing well with limiting her sodium intake despite liking her salt.  She is more consistent with her shakes and getting more regular meals.  She continues to stick with small portions and eating more often.        Expected Outcome  Be consistent about drinking at least 1 protein shake per day, add at least 1 additional snack in each day and look for snacks that contain healthy fats/ that are full-fat rather than non-fat  Short: Stick with eating more consistently.  Long: Continue to monitor and limit salt intake.         Personal Goal #2 Re-Evaluation   Personal Goal #2  Continue to limit sodium intake and to include a variety of protein sources  -         Nutrition Goals Discharge (Final Nutrition Goals Re-Evaluation): Nutrition Goals Re-Evaluation - 09/11/18 1233      Goals   Nutrition Goal  More consistent with protein shakes, regular meals and snacks, small portions, limit sodium    Comment  Dosia has been doing well with limiting her sodium intake despite liking her salt.  She is more consistent with her shakes and getting more regular meals.  She continues to stick with small portions and eating more often.      Expected Outcome  Short: Stick with eating more consistently.  Long: Continue to monitor and limit salt intake.  Psychosocial: Target Goals: Acknowledge presence or absence of  significant depression and/or stress, maximize coping skills, provide positive support system. Participant is able to verbalize types and ability to use techniques and skills needed for reducing stress and depression.   Initial Review & Psychosocial Screening: Initial Psych Review & Screening - 06/11/18 1251      Initial Review   Current issues with  Current Sleep Concerns;Current Stress Concerns    Source of Stress Concerns  Chronic Illness    Comments  Unable to sleep good every night. Tries not to take her lorazapam at night.  Health continues to be her biggest stressor.  She is able to do things she wants to do, but often needs to stop earlier than she wants.       Family Dynamics   Good Support System?  No   Limited   Comments  Lives by her self.  Son lives in [place].  Relies on neighbors and friends to take care of each other.       Barriers   Psychosocial barriers to participate in program  The patient should benefit from training in stress management and relaxation.;Psychosocial barriers identified (see note)      Screening Interventions   Interventions  Encouraged to exercise;Program counselor consult;Provide feedback about the scores to participant    Expected Outcomes  Short Term goal: Utilizing psychosocial counselor, staff and physician to assist with identification of specific Stressors or current issues interfering with healing process. Setting desired goal for each stressor or current issue identified.;Long Term Goal: Stressors or current issues are controlled or eliminated.;Short Term goal: Identification and review with participant of any Quality of Life or Depression concerns found by scoring the questionnaire.;Long Term goal: The participant improves quality of Life and PHQ9 Scores as seen by post scores and/or verbalization of changes       Quality of Life Scores:  Scores of 19 and below usually indicate a poorer quality of life in these areas.  A difference of  2-3  points is a clinically meaningful difference.  A difference of 2-3 points in the total score of the Quality of Life Index has been associated with significant improvement in overall quality of life, self-image, physical symptoms, and general health in studies assessing change in quality of life.  PHQ-9: Recent Review Flowsheet Data    Depression screen [facility] 2/9 10/07/2018 08/19/2018 06/11/2018   Decreased Interest 1 0 1   Down, Depressed, Hopeless 1 0 1   PHQ - 2 Score 2 0 2   Altered sleeping _0 Tired, decreased energy - 1 2   Change in appetite _1 Feeling bad or failure about yourself  2 0 1   Trouble concentrating 0 0 1   Moving slowly or fidgety/restless 0 0 0   Suicidal thoughts 0 0 0   PHQ-9 Score _2 Difficult doing work/chores Not difficult at all Not difficult at all Not difficult at all     Interpretation of Total Score  Total Score Depression Severity:  1-4 = Minimal depression, 5-9 = Mild depression, 10-14 = Moderate depression, 15-19 = Moderately severe depression, 20-27 = Severe depression   Psychosocial Evaluation and Intervention: Psychosocial Evaluation - 06/24/18 1225      Psychosocial Evaluation & Interventions   Interventions  Encouraged to exercise with the program and follow exercise prescription    Comments  Counselor met with Ms. Ebbie Latus) today for initial psychosocial  evaluation.  She is an 84 year old who has been in the Cardiac rehab and CARE programs and her Dr. recommended this program.  She has a history of lymphoma - now in remission.  She has a good support system with a son and she lives in a retirement community with good friends.  Graclynn reports sleeping "okay," with the help of medication at times.  She has a "terrible appetite" and reports this has been going on for quite some time.  Shanyiah denies a history of depression or anxiety or any current symptoms and her mood is typically positive.  Her health is her primary stressor at this  time.  Alethia's goal for this program is to "get through it" and to increase her energy.  She will be followed by staff here.     Expected Outcomes  Short:  Nyaira will benefit from consistent exercise to increase her energy and improve her sleep and appetite.   Long:  Mayola will continue positive life style choices to improve her health.    Continue Psychosocial Services   Follow up required by staff       Psychosocial Re-Evaluation: Psychosocial Re-Evaluation    [place] Name 06/28/18 1200 08/16/18 1151 08/19/18 1507 09/11/18 1231       Psychosocial Re-Evaluation   Current issues with  Current Sleep Concerns;Current Stress Concerns  Current Sleep Concerns;Current Stress Concerns  Current Sleep Concerns;Current Stress Concerns  Current Sleep Concerns;Current Stress Concerns    Comments  Sharan is off to a good start in rehab.  She has noticed that she sleeps better on the nights that she has rehab. She is struggling with her poor appeptite.  Her doctor is not worried about it yet.  Her health continues to be her biggest stressor.   She has some stress from her neighborhood asking for more money and she is trying to get money to fix her roof.   Aroura is doing well mentally and in a good palce.  She is sleeping better.  She was able to get the roof fixed and insurance is going to help pay.    Reviewed patient health questionnaire (PHQ-9) with patient for follow up. Previously, patients score indicated signs/symptoms of depression.  Reviewed to see if patient is improving symptom wise while in program.  Score improved and patient states that it is because she is not greiving for the death of her friend that she lost a few months ago. She is doing well in the program and has a positive attitude.  Tyesha has been doing well mentally.  She got a bill for her car that she wasn't expecting.  She has been sleeping well.  She is staying positive and keeping up with self care.  She does worry about her great grands but tries  not to let it get to her as much.     Expected Outcomes  Short: Continue to exericse more to sleep better.  Long: Continue to practice self care.   Short: Continue to exercise for sleep.  Long: Continue to take care of self.   Short: Continue to attend [facility] regularly for regular exercise and social engagement. Long: Continue to improve symptoms and manage a positive mental state  Short: Continue to regularly.  Long: Continue to practice self care.     Interventions  -  -  Encouraged to attend Pulmonary Rehabilitation for the exercise;Stress management education  Encouraged to attend Pulmonary Rehabilitation for the exercise;Stress management education    Continue  Psychosocial Services   -  -  Follow up required by staff  Follow up required by staff       Psychosocial Discharge (Final Psychosocial Re-Evaluation): Psychosocial Re-Evaluation - 09/11/18 1231      Psychosocial Re-Evaluation   Current issues with  Current Sleep Concerns;Current Stress Concerns    Comments  Keimani has been doing well mentally.  She got a bill for her car that she wasn't expecting.  She has been sleeping well.  She is staying positive and keeping up with self care.  She does worry about her great grands but tries not to let it get to her as much.     Expected Outcomes  Short: Continue to regularly.  Long: Continue to practice self care.     Interventions  Encouraged to attend Pulmonary Rehabilitation for the exercise;Stress management education    Continue Psychosocial Services   Follow up required by staff       Education: Education Goals: Education classes will be provided on a weekly basis, covering required topics. Participant will state understanding/return demonstration of topics presented.  Learning Barriers/Preferences: Learning Barriers/Preferences - 06/11/18 1255      Learning Barriers/Preferences   Learning Barriers  Sight   wears glasses   Learning Preferences  None       Education  Topics:  Initial Evaluation Education: - Verbal, written and demonstration of respiratory meds, oximetry and breathing techniques. Instruction on use of nebulizers and MDIs and importance of monitoring MDI activations.   Pulmonary Rehab from 10/09/2018 in [facility] Cardiac and Pulmonary Rehab  Date  06/11/18  Educator  [facility]  Instruction Review Code  1- Verbalizes Understanding      General Nutrition Guidelines/Fats and Fiber: -Group instruction provided by verbal, written material, models and posters to present the general guidelines for heart healthy nutrition. Gives an explanation and review of dietary fats and fiber.   Pulmonary Rehab from 10/09/2018 in [facility] Cardiac and Pulmonary Rehab  Date  10/02/18  Educator  [facility]  Instruction Review Code  1- Verbalizes Understanding      Controlling Sodium/Reading Food Labels: -Group verbal and written material supporting the discussion of sodium use in heart healthy nutrition. Review and explanation with models, verbal and written materials for utilization of the food label.   Pulmonary Rehab from 10/09/2018 in [facility] Cardiac and Pulmonary Rehab  Date  10/09/18  Educator  [facility]  Instruction Review Code  1- Verbalizes Understanding      Exercise Physiology & General Exercise Guidelines: - Group verbal and written instruction with models to review the exercise physiology of the cardiovascular system and associated critical values. Provides general exercise guidelines with specific guidelines to those with heart or lung disease.    Pulmonary Rehab from 10/09/2018 in [facility] Cardiac and Pulmonary Rehab  Date  09/11/18  Educator  [facility]  Instruction Review Code  1- Verbalizes Understanding      Aerobic Exercise & Resistance Training: - Gives group verbal and written instruction on the various components of exercise. Focuses on aerobic and resistive training programs and the benefits of this training and how to safely progress through these programs.   Pulmonary  Rehab from 10/09/2018 in [facility] Cardiac and Pulmonary Rehab  Date  09/13/18  Educator  [facility]  Instruction Review Code  1- Verbalizes Understanding      Flexibility, Balance, Mind/Body Relaxation: Provides group verbal/written instruction on the benefits of flexibility and balance training, including mind/body exercise modes such as yoga, pilates and tai chi.  Demonstration and skill practice provided.   Pulmonary Rehab from 10/09/2018 in [facility] Cardiac and Pulmonary Rehab  Date  09/18/18  Educator  [facility]  Instruction Review Code  1- Verbalizes Understanding      Stress and Anxiety: - Provides group verbal and written instruction about the health risks of elevated stress and causes of high stress.  Discuss the correlation between heart/lung disease and anxiety and treatment options. Review healthy ways to manage with stress and anxiety.   Pulmonary Rehab from 10/09/2018 in [facility] Cardiac and Pulmonary Rehab  Date  09/25/18  Educator  [facility]  Instruction Review Code  1- Verbalizes Understanding      Depression: - Provides group verbal and written instruction on the correlation between heart/lung disease and depressed mood, treatment options, and the stigmas associated with seeking treatment.   Exercise & Equipment Safety: - Individual verbal instruction and demonstration of equipment use and safety with use of the equipment.   Pulmonary Rehab from 10/09/2018 in [facility] Cardiac and Pulmonary Rehab  Date  06/11/18  Educator  [facility]  Instruction Review Code  1- Verbalizes Understanding      Infection Prevention: - Provides verbal and written material to individual with discussion of infection control including proper hand washing and proper equipment cleaning during exercise session.   Pulmonary Rehab from 10/09/2018 in [facility] Cardiac and Pulmonary Rehab  Date  06/11/18  Educator  [facility]  Instruction Review Code  1- Verbalizes Understanding      Falls Prevention: - Provides verbal and written material to  individual with discussion of falls prevention and safety.   Pulmonary Rehab from 10/09/2018 in [facility] Cardiac and Pulmonary Rehab  Date  06/11/18  Educator  [facility]  Instruction Review Code  1- Verbalizes Understanding      Diabetes: - Individual verbal and written instruction to review signs/symptoms of diabetes, desired ranges of glucose level fasting, after meals and with exercise. Advice that pre and post exercise glucose checks will be done for 3 sessions at entry of program.   Chronic Lung Diseases: - Group verbal and written instruction to review updates, respiratory medications, advancements in procedures and treatments. Discuss use of supplemental oxygen including available portable oxygen systems, continuous and intermittent flow rates, concentrators, personal use and safety guidelines. Review proper use of inhaler and spacers. Provide informative websites for self-education.    Pulmonary Rehab from 10/09/2018 in [facility] Cardiac and Pulmonary Rehab  Date  09/27/18  Educator  [facility]  Instruction Review Code  1- Verbalizes Understanding      Energy Conservation: - Provide group verbal and written instruction for methods to conserve energy, plan and organize activities. Instruct on pacing techniques, use of adaptive equipment and posture/positioning to relieve shortness of breath.   Pulmonary Rehab from 10/09/2018 in [facility] [facility]  Date  06/19/18  Educator  [facility]  Instruction Review Code  1- Verbalizes Understanding      Triggers and Exacerbations: - Group verbal and written instruction to review types of environmental triggers and ways to prevent exacerbations. Discuss weather changes, air quality and the benefits of nasal washing. Review warning signs and symptoms to help prevent infections. Discuss techniques for effective airway clearance, coughing, and vibrations.   AED/CPR: - Group verbal and written instruction with the use of models to demonstrate the basic use of  the AED with the basic ABC's of resuscitation.   Pulmonary Rehab from 10/09/2018 in [facility] Cardiac and Pulmonary Rehab  Date  09/04/18  Educator  [facility]  Instruction  Review Code  1- Actuary and Physiology of the Lungs: - Group verbal and written instruction with the use of models to provide basic lung anatomy and physiology related to function, structure and complications of lung disease.   Anatomy & Physiology of the Heart: - Group verbal and written instruction and models provide basic cardiac anatomy and physiology, with the coronary electrical and arterial systems. Review of Valvular disease and Heart Failure   Pulmonary Rehab from 10/09/2018 in [facility] Cardiac and Pulmonary Rehab  Date  08/23/18  Educator  [facility]  Instruction Review Code  1- Verbalizes Understanding      Cardiac Medications: - Group verbal and written instruction to review commonly prescribed medications for heart disease. Reviews the medication, class of the drug, and side effects.   Pulmonary Rehab from 10/09/2018 in [facility] Cardiac and Pulmonary Rehab  Date  08/30/18  Educator  [facility]  Instruction Review Code  1- Verbalizes Understanding      Know Your Numbers and Risk Factors: -Group verbal and written instruction about important numbers in your health.  Discussion of what are risk factors and how they play a role in the disease process.  Review of Cholesterol, Blood Pressure, Diabetes, and BMI and the role they play in your overall health.   Pulmonary Rehab from 10/09/2018 in [facility] Cardiac and Pulmonary Rehab  Date  08/07/18  Educator  [facility]  Instruction Review Code  1- Verbalizes Understanding      Sleep Hygiene: -Provides group verbal and written instruction about how sleep can affect your health.  Define sleep hygiene, discuss sleep cycles and impact of sleep habits. Review good sleep hygiene tips.    Pulmonary Rehab from 10/09/2018 in [facility] Cardiac and Pulmonary Rehab  Date  08/28/18  Educator   [facility]  Instruction Review Code  1- Verbalizes Understanding      Other: -Provides group and verbal instruction on various topics (see comments)    Knowledge Questionnaire Score: Knowledge Questionnaire Score - 10/07/18 1309      Knowledge Questionnaire Score   Pre Score  15/18    Post Score  16/18   reviewed with patient       Core Components/Risk Factors/Patient Goals at Admission: Personal Goals and Risk Factors at Admission - 06/11/18 1255      Core Components/Risk Factors/Patient Goals on Admission    Weight Management  Yes;Weight Maintenance   Feels worse when weight gets too low   Intervention  Weight Management: Develop a combined nutrition and exercise program designed to reach desired caloric intake, while maintaining appropriate intake of nutrient and fiber, sodium and fats, and appropriate energy expenditure required for the weight goal.;Weight Management: Provide education and appropriate resources to help participant work on and attain dietary goals.    Admit Weight  108 lb 8 oz (49.2 kg)    Goal Weight: Short Term  108 lb (49 kg)    Goal Weight: Long Term  108 lb (49 kg)    Expected Outcomes  Short Term: Continue to assess and modify interventions until short term weight is achieved;Long Term: Adherence to nutrition and physical activity/exercise program aimed toward attainment of established weight goal;Weight Maintenance: Understanding of the daily nutrition guidelines, which includes 25-35% calories from fat, 7% or less cal from saturated fats, less than 248m cholesterol, less than 1.5gm of sodium, & 5 or more servings of fruits and vegetables daily    Improve shortness of breath with ADL's  Yes  Intervention  Provide education, individualized exercise plan and daily activity instruction to help decrease symptoms of SOB with activities of daily living.    Expected Outcomes  Short Term: Improve cardiorespiratory fitness to achieve a reduction of symptoms when  performing ADLs;Long Term: Be able to perform more ADLs without symptoms or delay the onset of symptoms    Heart Failure  Yes    Intervention  Provide a combined exercise and nutrition program that is supplemented with education, support and counseling about heart failure. Directed toward relieving symptoms such as shortness of breath, decreased exercise tolerance, and extremity edema.    Expected Outcomes  Improve functional capacity of life;Short term: Attendance in program 2-3 days a week with increased exercise capacity. Reported lower sodium intake. Reported increased fruit and vegetable intake. Reports medication compliance.;Short term: Daily weights obtained and reported for increase. Utilizing diuretic protocols set by physician.;Long term: Adoption of self-care skills and reduction of barriers for early signs and symptoms recognition and intervention leading to self-care maintenance.    Hypertension  Yes    Intervention  Provide education on lifestyle modifcations including regular physical activity/exercise, weight management, moderate sodium restriction and increased consumption of fresh fruit, vegetables, and low fat dairy, alcohol moderation, and smoking cessation.;Monitor prescription use compliance.    Expected Outcomes  Short Term: Continued assessment and intervention until BP is < 140/57m HG in hypertensive participants. < 130/856mHG in hypertensive participants with diabetes, heart failure or chronic kidney disease.;Long Term: Maintenance of blood pressure at goal levels.    Lipids  Yes    Intervention  Provide education and support for participant on nutrition & aerobic/resistive exercise along with prescribed medications to achieve LDL <7062mHDL >66m25m  Expected Outcomes  Short Term: Participant states understanding of desired cholesterol values and is compliant with medications prescribed. Participant is following exercise prescription and nutrition guidelines.;Long Term:  Cholesterol controlled with medications as prescribed, with individualized exercise RX and with personalized nutrition plan. Value goals: LDL < 70mg62mL > 40 mg.       Core Components/Risk Factors/Patient Goals Review:  Goals and Risk Factor Review    Row Name 06/28/18 1203 08/16/18 1153 09/11/18 1228         Core Components/Risk Factors/Patient Goals Review   Personal Goals Review  Weight Management/Obesity;Heart Failure;Hypertension;Improve shortness of breath with ADL's;Lipids  Weight Management/Obesity;Heart Failure;Hypertension;Improve shortness of breath with ADL's;Lipids  Weight Management/Obesity;Heart Failure;Hypertension;Improve shortness of breath with ADL's;Lipids     Review  Her weight has been holding steady around 107-108lbs.  She is working on her diet and trying to eat good.  She has not had any signs of heart failure and overall her shortness of breath is getting better.  She is doing well with her blood pressure  and she has been checking them at home.    Runa Ashtonmainly been holding steady with her weight but it did go up a little last week.  She is doing well with her blood pressues and not had any heart failure symptoms.   She feels that her medications are working well and her breathing has improved.   Allysia Vincyinues to maintain her weight around 106-107 lbs.  She loves her salt but has been avoiding it.  She has not had any heart failure symptoms.  She admitted to low back around kidneys and has a follow up appointment next week.  She is still doing well with her pressures and continues to check them at home.  Her  breathing is getting better overall.  She is doing well with her meds and still would like to get off of the meds.      Expected Outcomes  Short: Continue to work on weight maintenance and not lose.  Long: Continue to monitor risk factors.   Short: Continue to maintain weight.  Long: Continue to monitor heart failure symptoms.   Short: Continue to montior weight.   Long: Continue to manage heart failure.         Core Components/Risk Factors/Patient Goals at Discharge (Final Review):  Goals and Risk Factor Review - 09/11/18 1228      Core Components/Risk Factors/Patient Goals Review   Personal Goals Review  Weight Management/Obesity;Heart Failure;Hypertension;Improve shortness of breath with ADL's;Lipids    Review  Breiona continues to maintain her weight around 106-107 lbs.  She loves her salt but has been avoiding it.  She has not had any heart failure symptoms.  She admitted to low back around kidneys and has a follow up appointment next week.  She is still doing well with her pressures and continues to check them at home.  Her breathing is getting better overall.  She is doing well with her meds and still would like to get off of the meds.     Expected Outcomes  Short: Continue to montior weight.  Long: Continue to manage heart failure.        ITP Comments: ITP Comments    Row Name 06/11/18 1236 07/08/18 0851 07/23/18 1533 08/05/18 0857 08/16/18 1149   ITP Comments  Medical Evaluation completed. Chart sent for review and changes to Dr. Emily Filbert Director of [place]. Diagnosis can be found in [facility] encounter 04/19/18  30 day review completed. ITP sent to Dr. Emily Filbert Director of [place]. Continue with ITP unless changes are made by physician.  [name] had scheduled eye surgery on 11/22.  Just waiting for clearance to return post surgery.   30 day review completed. ITP sent to Dr. Emily Filbert Director of [place]. Continue with ITP unless changes are made by physician.  Lesbia would like to be done by March to go on a trip with her son to [place].    [place] Name 09/02/18 0854 09/30/18 0845         ITP Comments  30 day review completed. ITP sent to Dr. Emily Filbert Director of [place]. Continue with ITP unless changes are made by physician.  30 day review completed. ITP sent to Dr. Emily Filbert Director of [place]. Continue with ITP unless changes are made  by physician.         Comments: discharge ITP

## 2018-10-14 NOTE — Patient Instructions (Signed)
Discharge Patient Instructions  Patient Details  Name: Jeanne Pittman MRN: [id number] Date of Birth: 1935/03/02 Referring Provider:  Wellington Hampshire, MD   Number of Visits: 58  Reason for Discharge:  Patient reached a stable level of exercise. Patient independent in their exercise. Patient has met program and personal goals.  Smoking History:  Social History   Tobacco Use  Smoking Status Former Smoker  . Types: Cigarettes  . Last attempt to quit: 1950  . Years since quitting: 70.1  Smokeless Tobacco Never Used    Diagnosis:  Chronic systolic heart failure ([place])  Initial Exercise Prescription: Initial Exercise Prescription - 06/11/18 1400      Date of Initial Exercise RX and Referring Provider   Date  06/11/18    Referring Provider  Kathlyn Sacramento MD      Treadmill   MPH  2.2    Grade  0.5    Minutes  15    METs  2.84      NuStep   Level  4    SPM  80    Minutes  15    METs  2.2      REL-XR   Level  2    Speed  50    Minutes  15    METs  2.2      Prescription Details   Frequency (times per week)  3    Duration  Progress to 45 minutes of aerobic exercise without signs/symptoms of physical distress      Intensity   THRR 40-80% of Max Heartrate  96-125    Ratings of Perceived Exertion  11-13    Perceived Dyspnea  0-4      Progression   Progression  Continue to progress workloads to maintain intensity without signs/symptoms of physical distress.      Resistance Training   Training Prescription  Yes    Weight  3 lbs    Reps  10-15       Discharge Exercise Prescription (Final Exercise Prescription Changes): Exercise Prescription Changes - 10/01/18 1300      Response to Exercise   Blood Pressure (Admit)  110/60    Blood Pressure (Exit)  122/80    Heart Rate (Admit)  82 bpm    Heart Rate (Exercise)  76 bpm    Heart Rate (Exit)  66 bpm    Oxygen Saturation (Admit)  96 %    Oxygen Saturation (Exercise)  88 %    Oxygen Saturation (Exit)  98 %     Rating of Perceived Exertion (Exercise)  12    Perceived Dyspnea (Exercise)  1    Symptoms  none    Duration  Continue with 45 min of aerobic exercise without signs/symptoms of physical distress.    Intensity  THRR unchanged      Progression   Progression  Continue to progress workloads to maintain intensity without signs/symptoms of physical distress.    Average METs  2.81      Resistance Training   Training Prescription  Yes    Weight  3 lbs    Reps  10-15      Interval Training   Interval Training  No      Treadmill   MPH  2.2    Grade  0.5    Minutes  15    METs  2.84      NuStep   Level  5    Minutes  15    METs  2      REL-XR   Level  3    Minutes  15    METs  3.6      Home Exercise Plan   Plans to continue exercise at  Home (comment)   walking   Frequency  Add 2 additional days to program exercise sessions.    Initial Home Exercises Provided  06/26/18       Functional Capacity: 6 Minute Walk    Row Name 06/11/18 1314 10/07/18 1220       6 Minute Walk   Phase  Initial  Discharge    Distance  1170 feet  1195 feet    Distance % Change  -  2 %    Distance Feet Change  -  25 ft    Walk Time  6 minutes  6 minutes    # of Rest Breaks  0  0    MPH  2.22  2.26    METS  2.04  2.14    RPE  11  10    Perceived Dyspnea   1  1    VO2 Peak  7.15  7.48    Symptoms  No  No    Resting HR  68 bpm  75 bpm    Resting BP  122/70  130/70    Resting Oxygen Saturation   99 %  95 %    Exercise Oxygen Saturation  during 6 min walk  94 %  89 %    Max Ex. HR  86 bpm  86 bpm    Max Ex. BP  124/66  130/82    2 Minute Post BP  128/72  110/80      Interval HR   1 Minute HR  76  78    2 Minute HR  83  82    3 Minute HR  82  86    4 Minute HR  84  85    5 Minute HR  84  82    6 Minute HR  86  86    2 Minute Post HR  75  71    Interval Heart Rate?  Yes  Yes      Interval Oxygen   Interval Oxygen?  Yes  -    Baseline Oxygen Saturation %  99 %  95 %    1 Minute  Oxygen Saturation %  95 %  97 %    1 Minute Liters of Oxygen  0 L Room Air  0 L    2 Minute Oxygen Saturation %  95 %  92 %    2 Minute Liters of Oxygen  0 L  0 L    3 Minute Oxygen Saturation %  94 %  94 %    3 Minute Liters of Oxygen  0 L  0 L    4 Minute Oxygen Saturation %  95 %  91 %    4 Minute Liters of Oxygen  0 L  0 L    5 Minute Oxygen Saturation %  94 %  89 %    5 Minute Liters of Oxygen  0 L  0 L    6 Minute Oxygen Saturation %  95 %  91 %    6 Minute Liters of Oxygen  0 L  0 L    2 Minute Post Oxygen Saturation %  98 %  98 %    2 Minute Post Liters of Oxygen  0 L  0 L       Quality of Life:   Personal Goals: Goals established at orientation with interventions provided to work toward goal. Personal Goals and Risk Factors at Admission - 06/11/18 1255      Core Components/Risk Factors/Patient Goals on Admission    Weight Management  Yes;Weight Maintenance   Feels worse when weight gets too low   Intervention  Weight Management: Develop a combined nutrition and exercise program designed to reach desired caloric intake, while maintaining appropriate intake of nutrient and fiber, sodium and fats, and appropriate energy expenditure required for the weight goal.;Weight Management: Provide education and appropriate resources to help participant work on and attain dietary goals.    Admit Weight  108 lb 8 oz (49.2 kg)    Goal Weight: Short Term  108 lb (49 kg)    Goal Weight: Long Term  108 lb (49 kg)    Expected Outcomes  Short Term: Continue to assess and modify interventions until short term weight is achieved;Long Term: Adherence to nutrition and physical activity/exercise program aimed toward attainment of established weight goal;Weight Maintenance: Understanding of the daily nutrition guidelines, which includes 25-35% calories from fat, 7% or less cal from saturated fats, less than 279m cholesterol, less than 1.5gm of sodium, & 5 or more servings of fruits and vegetables daily     Improve shortness of breath with ADL's  Yes    Intervention  Provide education, individualized exercise plan and daily activity instruction to help decrease symptoms of SOB with activities of daily living.    Expected Outcomes  Short Term: Improve cardiorespiratory fitness to achieve a reduction of symptoms when performing ADLs;Long Term: Be able to perform more ADLs without symptoms or delay the onset of symptoms    Heart Failure  Yes    Intervention  Provide a combined exercise and nutrition program that is supplemented with education, support and counseling about heart failure. Directed toward relieving symptoms such as shortness of breath, decreased exercise tolerance, and extremity edema.    Expected Outcomes  Improve functional capacity of life;Short term: Attendance in program 2-3 days a week with increased exercise capacity. Reported lower sodium intake. Reported increased fruit and vegetable intake. Reports medication compliance.;Short term: Daily weights obtained and reported for increase. Utilizing diuretic protocols set by physician.;Long term: Adoption of self-care skills and reduction of barriers for early signs and symptoms recognition and intervention leading to self-care maintenance.    Hypertension  Yes    Intervention  Provide education on lifestyle modifcations including regular physical activity/exercise, weight management, moderate sodium restriction and increased consumption of fresh fruit, vegetables, and low fat dairy, alcohol moderation, and smoking cessation.;Monitor prescription use compliance.    Expected Outcomes  Short Term: Continued assessment and intervention until BP is < 140/929mHG in hypertensive participants. < 130/8032mG in hypertensive participants with diabetes, heart failure or chronic kidney disease.;Long Term: Maintenance of blood pressure at goal levels.    Lipids  Yes    Intervention  Provide education and support for participant on nutrition &  aerobic/resistive exercise along with prescribed medications to achieve LDL <76m60mDL >40mg63m Expected Outcomes  Short Term: Participant states understanding of desired cholesterol values and is compliant with medications prescribed. Participant is following exercise prescription and nutrition guidelines.;Long Term: Cholesterol controlled with medications as prescribed, with individualized exercise RX and with personalized nutrition plan. Value goals: LDL < 76mg,24m > 40 mg.  Personal Goals Discharge: Goals and Risk Factor Review - 09/11/18 1228      Core Components/Risk Factors/Patient Goals Review   Personal Goals Review  Weight Management/Obesity;Heart Failure;Hypertension;Improve shortness of breath with ADL's;Lipids    Review  Shaunee continues to maintain her weight around 106-107 lbs.  She loves her salt but has been avoiding it.  She has not had any heart failure symptoms.  She admitted to low back around kidneys and has a follow up appointment next week.  She is still doing well with her pressures and continues to check them at home.  Her breathing is getting better overall.  She is doing well with her meds and still would like to get off of the meds.     Expected Outcomes  Short: Continue to montior weight.  Long: Continue to manage heart failure.        Exercise Goals and Review: Exercise Goals    Row Name 06/11/18 1429             Exercise Goals   Increase Physical Activity  Yes       Intervention  Provide advice, education, support and counseling about physical activity/exercise needs.;Develop an individualized exercise prescription for aerobic and resistive training based on initial evaluation findings, risk stratification, comorbidities and participant's personal goals.       Expected Outcomes  Short Term: Attend rehab on a regular basis to increase amount of physical activity.;Long Term: Add in home exercise to make exercise part of routine and to increase amount of  physical activity.;Long Term: Exercising regularly at least 3-5 days a week.       Increase Strength and Stamina  Yes       Intervention  Provide advice, education, support and counseling about physical activity/exercise needs.;Develop an individualized exercise prescription for aerobic and resistive training based on initial evaluation findings, risk stratification, comorbidities and participant's personal goals.       Expected Outcomes  Short Term: Increase workloads from initial exercise prescription for resistance, speed, and METs.;Short Term: Perform resistance training exercises routinely during rehab and add in resistance training at home;Long Term: Improve cardiorespiratory fitness, muscular endurance and strength as measured by increased METs and functional capacity (6MWT)       Able to understand and use rate of perceived exertion (RPE) scale  Yes       Intervention  Provide education and explanation on how to use RPE scale       Expected Outcomes  Short Term: Able to use RPE daily in rehab to express subjective intensity level;Long Term:  Able to use RPE to guide intensity level when exercising independently       Able to understand and use Dyspnea scale  Yes       Intervention  Provide education and explanation on how to use Dyspnea scale       Expected Outcomes  Short Term: Able to use Dyspnea scale daily in rehab to express subjective sense of shortness of breath during exertion;Long Term: Able to use Dyspnea scale to guide intensity level when exercising independently       Knowledge and understanding of Target Heart Rate Range (THRR)  Yes       Intervention  Provide education and explanation of THRR including how the numbers were predicted and where they are located for reference       Expected Outcomes  Short Term: Able to state/look up THRR;Short Term: Able to use daily as guideline for intensity in rehab;Long Term:  Able to use THRR to govern intensity when exercising independently        Able to check pulse independently  Yes       Intervention  Provide education and demonstration on how to check pulse in carotid and radial arteries.;Review the importance of being able to check your own pulse for safety during independent exercise       Expected Outcomes  Short Term: Able to explain why pulse checking is important during independent exercise;Long Term: Able to check pulse independently and accurately       Understanding of Exercise Prescription  Yes       Intervention  Provide education, explanation, and written materials on patient's individual exercise prescription       Expected Outcomes  Short Term: Able to explain program exercise prescription;Long Term: Able to explain home exercise prescription to exercise independently          Exercise Goals Re-Evaluation: Exercise Goals Re-Evaluation    Row Name 06/14/18 1126 06/17/18 1628 06/25/18 1630 06/26/18 1220 06/28/18 1159     Exercise Goal Re-Evaluation   Exercise Goals Review  -  Increase Physical Activity;Increase Strength and Stamina;Understanding of Exercise Prescription;Able to understand and use rate of perceived exertion (RPE) scale;Knowledge and understanding of Target Heart Rate Range (THRR)  Increase Physical Activity;Increase Strength and Stamina;Understanding of Exercise Prescription  Increase Physical Activity;Increase Strength and Stamina;Able to understand and use rate of perceived exertion (RPE) scale;Knowledge and understanding of Target Heart Rate Range (THRR);Able to check pulse independently;Understanding of Exercise Prescription  Increase Physical Activity;Increase Strength and Stamina;Understanding of Exercise Prescription   Comments  -  Reviewed RPE scale, THR and program prescription with pt today.  Pt voiced understanding and was given a copy of goals to take home.   Aimie is off to a good start in rehab.  She has been able to pick up the workloads she had gotten up to in [facility].  She has completed three  full days of exercise. We will continue to monitor her progression.   Reviewed home exercise with pt today.  Pt plans to walk at home for exercise.  Reviewed THR, pulse, RPE, sign and symptoms, and when to call 911 or MD.  Also discussed weather considerations and indoor options.  Pt voiced understanding.  Doloras is off to a good start in rehab.  We just reviewed home exercise and she is supposed to add it in next week.  She has noticed that exercise has helped increase her strength and stamina.    Expected Outcomes  -  Short: Use RPE daily to regulate intensity. Long: Follow program prescription in THR.  Short: Review home exercise guidelines.  Long: Continue to follow program prescription.   Short: Start to walk more at home.  Long: Continue to exercise more independently.   Short: Start walking at home.  Long: Continue to increase strength and stamina.    [place] Name 07/10/18 1522 07/23/18 1533 08/06/18 1340 08/16/18 1148 08/22/18 1658     Exercise Goal Re-Evaluation   Exercise Goals Review  Increase Physical Activity;Increase Strength and Stamina;Understanding of Exercise Prescription  -  Increase Physical Activity;Increase Strength and Stamina;Understanding of Exercise Prescription  Increase Physical Activity;Increase Strength and Stamina;Understanding of Exercise Prescription  Increase Physical Activity;Increase Strength and Stamina;Able to understand and use rate of perceived exertion (RPE) scale;Able to understand and use Dyspnea scale;Knowledge and understanding of Target Heart Rate Range (THRR);Understanding of Exercise Prescription   Comments  Toyia has been doing well in rehab.  She will be out at least through next week as she is having eye surgery on Friday.  She now up to level 5 on the NuStep.  We will continue to monitor her progress.   Out since last review  Cherelle returned yesterday after being cleared from her eye surgery.  She was able to come back and do her full workout.  We will continue to  monitor her progress.   Vernell is doing well being back in rehab.  She is doing some of her exercise at home, but has not been doing it during the last two weeks with kids being out of school.  She is feeling stronger and getting her stamina built back up.   Laron has maintained levels of exercise since last review.  She attended 4 sessions in December.  Regular attendance will enhance her progress.   Expected Outcomes  Short: Increase XR.  Long: Continue to walk more at home.   -  Short: Get back into swing of class and then start to increase workloads.  Long; Continue to increase strength and stamina.   Short: Continue to get back into routine of exercise.  Long: Continue to build strength and stamina.   Short - attend 2-3 times per week Long - improve overall MET level   Row Name 09/02/18 1618 09/11/18 1226 09/17/18 1413 10/01/18 1315       Exercise Goal Re-Evaluation   Exercise Goals Review  Increase Physical Activity;Increase Strength and Stamina;Understanding of Exercise Prescription  Increase Physical Activity;Increase Strength and Stamina;Understanding of Exercise Prescription  Increase Physical Activity;Increase Strength and Stamina;Understanding of Exercise Prescription  Increase Physical Activity;Increase Strength and Stamina;Understanding of Exercise Prescription    Comments  Charlene has been doing well in rehab.  She is now up to 4.2 METs on the XR.  We will continue to monitor her progress.   Jesi is doing well in rehab. She is feeling better overall and getting stronger.  Petrina has been doing her ADLs more easily.  She admits to being lazy about her exercise.   Yoshika continues to do well in rehab.  She should be ready to increase her XR.  We will continue to monitor her progress.   Kesley has been doing well in rehab.  She is already starting to near graduation.  We expect her to improve her post 6MWT.  We will continue to monitor her progress.     Expected Outcomes  Short: Continue to increase  workloads. Long: Continue to improve strength and stamina.   Short: Get back to walking at home.  Long: Continue to increase activity levels.   Short: Increase XR.  Long: Continue to increase activity at home.   Short: Improve post 6MWT.  Long: Continue to increase strength and stamina.        Nutrition & Weight - Outcomes: Pre Biometrics - 06/11/18 1429      Pre Biometrics   Height  5' (1.524 m)    Weight  108 lb 8 oz (49.2 kg)    Waist Circumference  26 inches    Hip Circumference  35 inches    Waist to Hip Ratio  0.74 %    BMI (Calculated)  21.19    Single Leg Stand  21.44 seconds      Post Biometrics - 10/07/18 1220       Post  Biometrics   Height  5' (1.524 m)    Weight  107 lb (48.5 kg)    Waist Circumference  27 inches    Hip Circumference  35 inches    Waist to Hip Ratio  0.77 %    BMI (Calculated)  20.9       Nutrition: Nutrition Therapy & Goals - 08/19/18 1226      Nutrition Therapy   Diet  DASH    Protein (specify units)  6oz    Fiber  20 grams    Whole Grain Foods  3 servings   does not typically choose whole grains   Saturated Fats  11 max. grams    Fruits and Vegetables  4 servings/day   8 ideal; she has a fair-poor appetite   Sodium  1500 grams      Personal Nutrition Goals   Nutrition Goal  D/t recent wt loss, be consistent about drinking your protein shakes daily, eat snacks between meals more regularly, and choose fuller fat options for items like yogurt or cottage cheese    Personal Goal #2  Continue to limit sodium intake and to include a variety of protein sources    Comments  Pt reports recent wt loss of 8-10# over the past couple of months r/t decreased appetite and depression s/p friend passing away. UBW 115#. However she feels she is slowly regaining her appetite. She "gave up" salt in 2015 and sometimes misses the flavor that salt brings to food. She eats 3 meals/day, with breakfast being her largest meal and lunch and supper being "light."  Breakfast options, egg, bacon, white toast, oatmeal, cereal, grits. Lunch options: [employer] soup occasionally, tuna or chicken salad, vegetables. Dinner options: soup, 1/2 sandwich, vegetables. Snacks: fruit, yogurt, energy bar, protein shake mid morning. Beverages: soda occasionally, water mostly, coffee, hot tea. She does not eat red meat but enjoys other meats like chicken, fish, shrimp regularly. If she buys canned vegetables she rinses them off with water first. She does not eat out often      Intervention Plan   Intervention  Prescribe, educate and counsel regarding individualized specific dietary modifications aiming towards targeted core components such as weight, hypertension, lipid management, diabetes, heart failure and other comorbidities.    Expected Outcomes  Short Term Goal: Understand basic principles of dietary content, such as calories, fat, sodium, cholesterol and nutrients.;Short Term Goal: A plan has been developed with personal nutrition goals set during dietitian appointment.;Long Term Goal: Adherence to prescribed nutrition plan.       Nutrition Discharge: Nutrition Assessments - 10/07/18 1309      MEDFICTS Scores   Pre Score  35    Post Score  24    Score Difference  -11       Education Questionnaire Score: Knowledge Questionnaire Score - 10/07/18 1309      Knowledge Questionnaire Score   Pre Score  15/18    Post Score  16/18   reviewed with patient      Goals reviewed with patient; copy given to patient.

## 2018-10-24 ENCOUNTER — Ambulatory Visit (INDEPENDENT_AMBULATORY_CARE_PROVIDER_SITE_OTHER): Payer: Medicare Other | Admitting: Cardiovascular Disease

## 2018-10-24 VITALS — BP 114/50 | HR 68 | Ht 61.0 in | Wt 107.8 lb

## 2018-10-24 DIAGNOSIS — I255 Ischemic cardiomyopathy: Secondary | ICD-10-CM

## 2018-10-24 DIAGNOSIS — E785 Hyperlipidemia, unspecified: Secondary | ICD-10-CM | POA: Diagnosis not present

## 2018-10-24 DIAGNOSIS — I2510 Atherosclerotic heart disease of native coronary artery without angina pectoris: Secondary | ICD-10-CM | POA: Diagnosis not present

## 2018-10-24 NOTE — Progress Notes (Signed)
Cardiology Office Note   Date:  10/24/2018   ID:  Jeanne Pittman, DOB [birth year], MRN [id number]  PCP:  [name], MD  Cardiologist:   Kathlyn Sacramento, MD   Chief Complaint  Patient presents with  . Other    6 month follow up. Patient c/o losing weight. Meds reviewed verbally with patient.       History of Present Illness: Jeanne Pittman is a 84 y.o. female who presents for a followup visit regarding coronary artery disease and chronic systolic heart failure due to ischemic cardiomyopathy. She had anterior ST elevation myocardial infarction in December 2013 with late presentation complicated by cardiogenic shock. Emergent cardiac catheterization showed an occluded mid LAD and 95% stenosis in the proximal RCA. 2 drug-eluting stents were placed in the mid LAD and 1 drug-eluting stent to the proximal RCA. Ejection fraction was 25-30% with akinesis of the mid distal anterior, apical and distal inferior wall.  She has known history of non-Hodgkin's lymphoma which is being observed. She has known history of myalgia with atorvastatin but she has been tolerating rosuvastatin.  Most recent echocardiogram in August 2018 showed an EF of 40 to 45% with mild aortic and mitral regurgitation and moderate tricuspid regurgitation.  There was mild pulmonary hypertension.  Carlton Adam Myoview was  in August 2018 showed evidence of prior anterior infarct without significant ischemia.  She attended pulmonary rehab and enjoyed that.  She denies chest pain or shortness of breath.  She has minimal palpitations.  Past Medical History:  Diagnosis Date  . A-fib ([place])    07/2012: A. fib with RVR in the setting of myocardial infarction. Converted to sinus rhythm with amiodarone. No recurrence.  . Anxiety   . Atrophic vaginitis   . Chronic combined systolic (congestive) and diastolic (congestive) heart failure ([place])    a. EF was 25-35% post MI but improved to 45-50% in 04/2013; b. 03/2017 Echo: EF 40-45%,  mod focal/basal hypertrophy of septum. Sev mid-apicalanteroseptal, ant, and apical HK. Gr1 DD, mild AI/MR, mod TR, PASP 60mHg.  . Colon adenomas   . Coronary artery disease    a. 60/6301 STEMI complicated by cardiogenic shock. Cath/PCI: LAD 170m (DESx2), RCA 95p(DES). EF 25%; b. 03/2017 MV: EF 57%, fixed apical, periapical, mid to distal anteroseptal defect. No ischemia.  [name] Dysrhythmia    A-fib  . Fibrocystic breast disease   . GERD (gastroesophageal reflux disease)   . Glaucoma   . History of gastritis   . Hyperlipidemia   . Hypertension   . Insomnia   . Ischemic cardiomyopathy    a. 07/2012 EF 25-35% following MI-->Improved to 45-50%;  b. 03/2017 Echo: EF 40-45%.  . Myocardial infarction ([place]) 08/18/12  . Non Hodgkin's lymphoma ([place]) 2005   reoccurance 2007 and 2012  . Osteoarthritis   . Polyneuropathy   . Polyposis of colon   . Restless leg syndrome   . Urinary, incontinence, stress female     Past Surgical History:  Procedure Laterality Date  . ABDOMINAL HYSTERECTOMY  1956  . APPENDECTOMY    . AXILLARY LYMPH NODE BIOPSY Left 03/18/2015   Procedure: AXILLARY LYMPH NODE BIOPSY;  Surgeon: [name], MD;  Location: [name];  Service: General;  Laterality: Left;  . CARDIAC CATHETERIZATION  08/19/2012   [name]; Jahmier Willadsen  . COLONOSCOPY    . COLONOSCOPY WITH PROPOFOL N/A 03/02/2016   Procedure: COLONOSCOPY WITH PROPOFOL;  Surgeon: Manya Silvas, MD;  Location[facility] ENDOSCOPY;  Service: Endoscopy;  Laterality:  N/A;  . COLONOSCOPY WITH PROPOFOL N/A 09/26/2017   Procedure: COLONOSCOPY WITH PROPOFOL;  Surgeon: Manya Silvas, MD;  Location: [facility] ENDOSCOPY;  Service: Endoscopy;  Laterality: N/A;  . CORONARY ANGIOPLASTY WITH STENT PLACEMENT     x3 stents  . CORONARY ANGIOPLASTY WITH STENT PLACEMENT    . CORONARY ARTERY BYPASS GRAFT    . ESOPHAGOGASTRODUODENOSCOPY (EGD) WITH PROPOFOL N/A 03/02/2016   Procedure: ESOPHAGOGASTRODUODENOSCOPY (EGD) WITH PROPOFOL;  Surgeon: Manya Silvas, MD;  Location: [facility] ENDOSCOPY;  Service: Endoscopy;  Laterality: N/A;  . LYMPH NODE BIOPSY Right 07-24-11   Dr Bary Castilla  . MOHS SURGERY     bilateral shoulders  . PTCA    . skin cancer removal    . TOTAL ABDOMINAL HYSTERECTOMY W/ BILATERAL SALPINGOOPHORECTOMY       Current Outpatient Medications  Medication Sig Dispense Refill  . acetaminophen (TYLENOL) 650 MG CR tablet Take 650 mg by mouth every 8 (eight) hours as needed for pain.    [name] aspirin 81 MG tablet Take 81 mg by mouth every morning.     . carvedilol (COREG) 3.125 MG tablet TAKE 1 TABLET BY MOUTH TWICE DAILY WITH A MEAL 60 tablet 6  . Cholecalciferol (VITAMIN D-3) 5000 UNITS TABS Take 2,000 Int'l Units by mouth every morning.     . fexofenadine (ALLEGRA) 180 MG tablet Take 180 mg by mouth daily as needed for rhinitis.     . furosemide (LASIX) 20 MG tablet Take 1 tablet (20 mg total) by mouth daily. (Patient taking differently: Take 20 mg by mouth daily as needed. ) 30 tablet 6  . gabapentin (NEURONTIN) 300 MG capsule Take 300 mg by mouth as needed.    . latanoprost (XALATAN) 0.005 % ophthalmic solution Place 1 drop into both eyes at bedtime.     [name] LORazepam (ATIVAN) 0.5 MG tablet Take 0.5 mg by mouth every 8 (eight) hours as needed for anxiety.     [name] losartan (COZAAR) 25 MG tablet TAKE 1/2 TABLET(12.5 MG) BY MOUTH DAILY AFTER BREAKFAST (Patient taking differently: Take 25 mg by mouth daily. ) 15 tablet 8  . oxybutynin (DITROPAN-XL) 10 MG 24 hr tablet Take 1 tablet (10 mg total) by mouth daily. 30 tablet 3  . pantoprazole (PROTONIX) 40 MG tablet TAKE 1 TABLET(40 MG) BY MOUTH TWICE DAILY 60 tablet 3  . rosuvastatin (CRESTOR) 10 MG tablet TAKE 1 TABLET(10 MG) BY MOUTH EVERY OTHER DAY 45 tablet 2  . timolol (TIMOPTIC) 0.5 % ophthalmic solution Place 1 drop into both eyes 2 (two) times daily.      No current facility-administered medications for this visit.     Allergies:   Ace inhibitors; Benadryl [diphenhydramine hcl];  Codeine; Diphenhydramine; Guaiacol; Hydrocodone; and Guaifenesin & derivatives    Social History:  The patient  reports that she quit smoking about 70 years ago. Her smoking use included cigarettes. She has never used smokeless tobacco. She reports that she does not drink alcohol or use drugs.   Family History:  The patient's family history includes Heart attack in her father; Heart disease in her brother; Leukemia in her mother.    ROS:  Please see the history of present illness.   Otherwise, review of systems are positive for none.   All other systems are reviewed and negative.    PHYSICAL EXAM: VS:  BP (!) 114/50 (BP Location: Left Arm, Patient Position: Sitting, Cuff Size: Normal)   Pulse 68   Ht 5\' 1"  (1.549 m)  Wt 107 lb 12 oz (48.9 kg)   BMI 20.36 kg/m  , BMI Body mass index is 20.36 kg/m. GEN: Well nourished, well developed, in no acute distress  HEENT: normal  Neck: no JVD, carotid bruits, or masses Cardiac: RRR; no murmurs, rubs, or gallops,no edema  Respiratory:  clear to auscultation bilaterally, normal work of breathing GI: soft, nontender, nondistended, + BS MS: no deformity or atrophy  Skin: warm and dry, no rash Neuro:  Strength and sensation are intact Psych: euthymic mood, full affect   EKG:  EKG is ordered today. The ekg ordered today demonstrates normal sinus rhythm with left axis deviation.  Old septal and inferior infarct.   Recent Labs: 06/07/2018: ALT 11; BUN 23; Creatinine, Ser 0.84; Hemoglobin 12.6; Platelets 147; Potassium 4.0; Sodium 139    Lipid Panel    Component Value Date/Time   CHOL 172 09/02/2015 0824   TRIG 111 09/02/2015 0824   HDL 51 09/02/2015 0824   CHOLHDL 3.4 09/02/2015 0824   LDLCALC 99 09/02/2015 0824      Wt Readings from Last 3 Encounters:  10/24/18 107 lb 12 oz (48.9 kg)  10/07/18 107 lb (48.5 kg)  06/11/18 108 lb 8 oz (49.2 kg)        ASSESSMENT AND PLAN:  1.  Coronary artery disease involving native coronary  arteries without angina: She is doing well overall with improvement in symptoms especially after attending rehab.  I encouraged her to continue in the forever fit program.  2. Hyperlipidemia: She is tolerating rosuvastatin.  I reviewed most recent lipid profile from last month which showed an HDL of 40, triglyceride of 148 and an LDL of 62.  Continue same treatment.  3. Ischemic cardiomyopathy: Most recent ejection fraction was 45 to 50 %. Continue low-dose carvedilol and losartan. She is euvolemic.  4.  Palpitations: She reports improvement in symptoms overall without intervention.   Disposition:   FU with me in 6 months  Signed,  Kathlyn Sacramento, MD  10/24/2018 9:10 AM    [place]

## 2018-10-24 NOTE — Patient Instructions (Signed)
Medication Instructions:  No change If you need a refill on your cardiac medications before your next appointment, please call your pharmacy.   Lab work: None ordered  Testing/Procedures: None ordered  Follow-Up: At [facility], you and your health needs are our priority.  As part of our continuing mission to provide you with exceptional heart care, we have created designated Provider Care Teams.  These Care Teams include your primary Cardiologist (physician) and Advanced Practice Providers (APPs -  Physician Assistants and Nurse Practitioners) who all work together to provide you with the care you need, when you need it. You will need a follow up appointment in 6 months.  Please call our office 2 months in advance to schedule this appointment.  You may see Kathlyn Sacramento, MD or one of the following Advanced Practice Providers on your designated Care Team:   Murray Hodgkins, NP Christell Faith, PA-C . Marrianne Mood, PA-C

## 2018-11-18 ENCOUNTER — Telehealth: Payer: Self-pay | Admitting: *Deleted

## 2018-11-18 NOTE — Telephone Encounter (Signed)
I let son know to expect a call from scheduler to move appointment out by 3 months from April. He will let his mother know also and will await for call with new appts

## 2018-11-18 NOTE — Telephone Encounter (Signed)
Hassan Rowan- please inform pt/son that I think its reasonable given the corona virus pandemic- to push her PET scan/ visit with me to 3 months from April appt.   Colette- please reschedule- Thx

## 2018-11-18 NOTE — Telephone Encounter (Addendum)
Son called asking if patient should keep her PET and follow up appts in April. Patient is fine keeping appts as is to find out if the "hot spots" are getting worse.Please advise

## 2018-12-06 ENCOUNTER — Ambulatory Visit: Payer: Medicare Other | Admitting: Internal Medicine

## 2018-12-06 ENCOUNTER — Other Ambulatory Visit: Payer: Medicare Other

## 2018-12-23 ENCOUNTER — Other Ambulatory Visit: Payer: Self-pay | Admitting: Cardiovascular Disease

## 2018-12-24 NOTE — Telephone Encounter (Signed)
Patient returning call.

## 2018-12-24 NOTE — Telephone Encounter (Signed)
Lmovm to verify how pt is taking losartan 25 mg tablet.

## 2019-01-14 ENCOUNTER — Other Ambulatory Visit: Payer: Self-pay | Admitting: Cardiovascular Disease

## 2019-02-05 ENCOUNTER — Encounter
Admission: RE | Admit: 2019-02-05 | Discharge: 2019-02-05 | Disposition: A | Discharge status: Home / Self Care | Payer: Medicare Other | Source: Ambulatory Visit | Attending: Internal Medicine | Admitting: Internal Medicine

## 2019-02-05 ENCOUNTER — Other Ambulatory Visit: Payer: Self-pay

## 2019-02-05 DIAGNOSIS — C8304 Small cell B-cell lymphoma, lymph nodes of axilla and upper limb: Secondary | ICD-10-CM

## 2019-02-05 DIAGNOSIS — I2510 Atherosclerotic heart disease of native coronary artery without angina pectoris: Secondary | ICD-10-CM | POA: Diagnosis not present

## 2019-02-05 DIAGNOSIS — I700 Atherosclerosis of aorta: Secondary | ICD-10-CM | POA: Diagnosis not present

## 2019-02-05 DIAGNOSIS — C8384 Other non-follicular lymphoma, lymph nodes of axilla and upper limb: Secondary | ICD-10-CM | POA: Insufficient documentation

## 2019-02-05 LAB — GLUCOSE, CAPILLARY: Glucose-Capillary: 91 mg/dL (ref 70–99)

## 2019-02-05 MED ORDER — FLUDEOXYGLUCOSE F - 18 (FDG) INJECTION
5.5000 | Freq: Once | INTRAVENOUS | Status: AC | PRN
  Administered 2019-02-05: 5.7 via INTRAVENOUS

## 2019-02-07 ENCOUNTER — Other Ambulatory Visit: Payer: Self-pay

## 2019-02-07 ENCOUNTER — Inpatient Hospital Stay: Payer: Medicare Other | Attending: Internal Medicine

## 2019-02-07 ENCOUNTER — Inpatient Hospital Stay: Payer: Medicare Other | Attending: Internal Medicine | Admitting: Internal Medicine

## 2019-02-07 VITALS — BP 161/80 | HR 63 | Temp 97.4°F | Resp 20 | Ht 61.0 in | Wt 104.0 lb

## 2019-02-07 DIAGNOSIS — C8384 Other non-follicular lymphoma, lymph nodes of axilla and upper limb: Secondary | ICD-10-CM | POA: Insufficient documentation

## 2019-02-07 DIAGNOSIS — F419 Anxiety disorder, unspecified: Secondary | ICD-10-CM | POA: Diagnosis not present

## 2019-02-07 DIAGNOSIS — I252 Old myocardial infarction: Secondary | ICD-10-CM | POA: Diagnosis not present

## 2019-02-07 DIAGNOSIS — C8304 Small cell B-cell lymphoma, lymph nodes of axilla and upper limb: Secondary | ICD-10-CM

## 2019-02-07 DIAGNOSIS — R300 Dysuria: Secondary | ICD-10-CM

## 2019-02-07 DIAGNOSIS — E785 Hyperlipidemia, unspecified: Secondary | ICD-10-CM | POA: Diagnosis not present

## 2019-02-07 DIAGNOSIS — Z79899 Other long term (current) drug therapy: Secondary | ICD-10-CM | POA: Diagnosis not present

## 2019-02-07 DIAGNOSIS — I4891 Unspecified atrial fibrillation: Secondary | ICD-10-CM | POA: Diagnosis not present

## 2019-02-07 DIAGNOSIS — D696 Thrombocytopenia, unspecified: Secondary | ICD-10-CM | POA: Insufficient documentation

## 2019-02-07 DIAGNOSIS — Z7982 Long term (current) use of aspirin: Secondary | ICD-10-CM | POA: Diagnosis not present

## 2019-02-07 DIAGNOSIS — Z87891 Personal history of nicotine dependence: Secondary | ICD-10-CM | POA: Insufficient documentation

## 2019-02-07 DIAGNOSIS — I10 Essential (primary) hypertension: Secondary | ICD-10-CM | POA: Insufficient documentation

## 2019-02-07 LAB — CBC WITH DIFFERENTIAL/PLATELET
Abs Immature Granulocytes: 0.02 10*3/uL (ref 0.00–0.07)
Basophils Absolute: 0.1 10*3/uL (ref 0.0–0.1)
Basophils Relative: 1 %
Eosinophils Absolute: 0.2 10*3/uL (ref 0.0–0.5)
Eosinophils Relative: 2 %
HCT: 34.8 % — ABNORMAL LOW (ref 36.0–46.0)
Hemoglobin: 12 g/dL (ref 12.0–15.0)
Immature Granulocytes: 0 %
Lymphocytes Relative: 55 %
Lymphs Abs: 5 10*3/uL — ABNORMAL HIGH (ref 0.7–4.0)
MCH: 32.3 pg (ref 26.0–34.0)
MCHC: 34.5 g/dL (ref 30.0–36.0)
MCV: 93.5 fL (ref 80.0–100.0)
Monocytes Absolute: 0.5 10*3/uL (ref 0.1–1.0)
Monocytes Relative: 6 %
Neutro Abs: 3.3 10*3/uL (ref 1.7–7.7)
Neutrophils Relative %: 36 %
Platelets: 138 10*3/uL — ABNORMAL LOW (ref 150–400)
RBC: 3.72 MIL/uL — ABNORMAL LOW (ref 3.87–5.11)
RDW: 13.5 % (ref 11.5–15.5)
WBC: 9.1 10*3/uL (ref 4.0–10.5)
nRBC: 0 % (ref 0.0–0.2)

## 2019-02-07 LAB — URINALYSIS, COMPLETE (UACMP) WITH MICROSCOPIC
Bacteria, UA: NONE SEEN
Bilirubin Urine: NEGATIVE
Glucose, UA: NEGATIVE mg/dL
Hgb urine dipstick: NEGATIVE
Ketones, ur: NEGATIVE mg/dL
Leukocytes,Ua: NEGATIVE
Nitrite: NEGATIVE
Protein, ur: NEGATIVE mg/dL
Specific Gravity, Urine: 1.01 (ref 1.005–1.030)
pH: 6 (ref 5.0–8.0)

## 2019-02-07 LAB — COMPREHENSIVE METABOLIC PANEL
ALT: 11 U/L (ref 0–44)
AST: 14 U/L — ABNORMAL LOW (ref 15–41)
Albumin: 4.1 g/dL (ref 3.5–5.0)
Alkaline Phosphatase: 50 U/L (ref 38–126)
Anion gap: 10 (ref 5–15)
BUN: 22 mg/dL (ref 8–23)
CO2: 24 mmol/L (ref 22–32)
Calcium: 9.1 mg/dL (ref 8.9–10.3)
Chloride: 102 mmol/L (ref 98–111)
Creatinine, Ser: 0.9 mg/dL (ref 0.44–1.00)
GFR calc Af Amer: >60 mL/min (ref >60)
GFR calc non Af Amer: 60 mL/min — ABNORMAL LOW (ref >60)
Glucose, Bld: 92 mg/dL (ref 70–99)
Potassium: 4.3 mmol/L (ref 3.5–5.1)
Sodium: 136 mmol/L (ref 135–145)
Total Bilirubin: 0.9 mg/dL (ref 0.3–1.2)
Total Protein: 7 g/dL (ref 6.5–8.1)

## 2019-02-07 LAB — LACTATE DEHYDROGENASE: LDH: 98 U/L (ref 98–192)

## 2019-02-07 NOTE — Progress Notes (Signed)
Patient here for follow-up for h/o lymphoma.  Pt c/o chronic low back pain. Made worse with "sitting for prolong periods, heavy house work and getting up and down from floor."  Described pain as "achy."  Luke NOTE  Patient Care Team: [name], MD as PCP - General (Unknown Physician Specialty) Wellington Hampshire, MD as PCP - Cardiology (Cardiology) Byrnett, Forest Gleason, MD (General Surgery) Forest Gleason, MD (Inactive) (Oncology) Wellington Hampshire, MD as Consulting Physician (Cardiology)  Cancer Staging No matching staging information was found for the patient.    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;  Rituxan once a week started in January of 2013  # July 2016-RECURRENCE- LYMPH NODE, LEFT AXILLA; EXCISIONAL BIOPSY:  - LOW-GRADE B-CELL LYMPHOMA, IMMUNOPHENOTYPICALLY MOST CONSISTENT WITH CLL/SLL, PET April 2018- STABLE/slight progression  # acute MI in December of 2014; #  upper and lower endoscopy done in May of 2015 ---------------------------------------------------   DIAGNOSIS: Follicular/MARGINAL ZONE LYMPHOMA/ SLL   STAGE:   IV   ;GOALS: control  CURRENT/MOST RECENT THERAPY : surveillance     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  female pleasant patient above history of recurrent low-grade lymphoma/B cell non-Hodgkin type is here for follow-up.   Patient continues to have poor appetite.  Complains of fatigue.  Continues to have weight loss.  Denies any night sweats denies  any lumps or bumps.  No nausea no vomiting.  However after she had the PET scan couple of days ago she noted to have bilateral flank pain; tingling and numbness in extremities.  No nausea no vomiting.  Review of Systems  Constitutional: Positive for malaise/fatigue and weight loss. Negative for chills, diaphoresis and fever.  HENT: Negative for nosebleeds and sore throat.   Eyes: Negative for double vision.  Respiratory: Negative for cough, hemoptysis, sputum production, shortness of breath and wheezing.   Cardiovascular: Negative for chest pain, palpitations, orthopnea and leg swelling.  Gastrointestinal: Negative for abdominal pain, blood in stool, constipation, diarrhea, heartburn, melena, nausea and vomiting.  Genitourinary: Negative for dysuria, frequency and urgency.  Musculoskeletal: Positive for back pain and joint pain.  Skin: Negative.  Negative for itching and rash.  Neurological: Negative for dizziness, tingling, focal weakness, weakness and headaches.  Endo/Heme/Allergies: Does not bruise/bleed easily.  Psychiatric/Behavioral: Negative for depression. The patient is not nervous/anxious and does not have insomnia.     PAST MEDICAL HISTORY :  Past Medical History:  Diagnosis Date  . A-fib ([place])    07/2012: A. fib with RVR in the setting of myocardial infarction. Converted to sinus rhythm with amiodarone. No recurrence.  . Anxiety   . Atrophic vaginitis   . Chronic combined systolic (congestive) and diastolic (congestive) heart failure ([place])    a. EF was 25-35% post MI but improved to 45-50% in 04/2013; b. 03/2017 Echo: EF 40-45%, mod focal/basal hypertrophy of septum. Sev mid-apicalanteroseptal, ant, and apical HK. Gr1 DD, mild AI/MR, mod TR, PASP 74mHg.  . Colon adenomas   . Coronary artery disease  a. 82/9937 STEMI complicated by cardiogenic shock. Cath/PCI: LAD 160m (DESx2), RCA 95p(DES). EF 25%; b. 03/2017 MV: EF 57%, fixed apical, periapical, mid to distal anteroseptal  defect. No ischemia.  [name] Dysrhythmia    A-fib  . Fibrocystic breast disease   . GERD (gastroesophageal reflux disease)   . Glaucoma   . History of gastritis   . Hyperlipidemia   . Hypertension   . Insomnia   . Ischemic cardiomyopathy    a. 07/2012 EF 25-35% following MI-->Improved to 45-50%;  b. 03/2017 Echo: EF 40-45%.  . Myocardial infarction ([place]) 08/18/12  . Non Hodgkin's lymphoma ([place]) 2005   reoccurance 2007 and 2012  . Osteoarthritis   . Polyneuropathy   . Polyposis of colon   . Restless leg syndrome   . Urinary, incontinence, stress female     PAST SURGICAL HISTORY :   Past Surgical History:  Procedure Laterality Date  . ABDOMINAL HYSTERECTOMY  1956  . APPENDECTOMY    . AXILLARY LYMPH NODE BIOPSY Left 03/18/2015   Procedure: AXILLARY LYMPH NODE BIOPSY;  Surgeon: [name], MD;  Location: [name];  Service: General;  Laterality: Left;  . CARDIAC CATHETERIZATION  08/19/2012   [name]; [place]  . COLONOSCOPY    . COLONOSCOPY WITH PROPOFOL N/A 03/02/2016   Procedure: COLONOSCOPY WITH PROPOFOL;  Surgeon: Manya Silvas, MD;  Location[facility]COPY;  Service: Endoscopy;  Laterality: N/A;  . COLONOSCOPY WITH PROPOFOL N/A 09/26/2017   Procedure: COLONOSCOPY WITH PROPOFOL;  Surgeon: Manya Silvas, MD;  Location: [facility] ENDOSCOPY;  Service: Endoscopy;  Laterality: N/A;  . CORONARY ANGIOPLASTY WITH STENT PLACEMENT     x3 stents  . CORONARY ANGIOPLASTY WITH STENT PLACEMENT    . CORONARY ARTERY BYPASS GRAFT    . ESOPHAGOGASTRODUODENOSCOPY (EGD) WITH PROPOFOL N/A 03/02/2016   Procedure: ESOPHAGOGASTRODUODENOSCOPY (EGD) WITH PROPOFOL;  Surgeon: Manya Silvas, MD;  Location[facility] ENDOSCOPY;  Service: Endoscopy;  Laterality: N/A;  . LYMPH NODE BIOPSY Right 07-24-11   Dr Bary Castilla  . MOHS SURGERY     bilateral shoulders  . PTCA    . skin cancer removal    . TOTAL ABDOMINAL HYSTERECTOMY W/ BILATERAL SALPINGOOPHORECTOMY      FAMILY HISTORY :   Family History  Problem  Relation Age of Onset  . Heart attack Father   . Heart disease Brother   . Leukemia Mother   . Bladder Cancer Neg Hx   . Prostate cancer Neg Hx   . Kidney cancer Neg Hx   . Breast cancer Neg Hx     SOCIAL HISTORY:   Social History   Tobacco Use  . Smoking status: Former Smoker    Types: Cigarettes    Quit date: 1950    Years since quitting: 70.5  . Smokeless tobacco: Never Used  Substance Use Topics  . Alcohol use: No    Alcohol/week: 0.0 standard drinks  . Drug use: No    ALLERGIES:  is allergic to ace inhibitors; benadryl [diphenhydramine hcl]; codeine; diphenhydramine; guaiacol; hydrocodone; and guaifenesin & derivatives.  MEDICATIONS:  Current Outpatient Medications  Medication Sig Dispense Refill  . acetaminophen (TYLENOL) 650 MG CR tablet Take 650 mg by mouth every 8 (eight) hours as needed for pain.    [name] aspirin 81 MG tablet Take 81 mg by mouth every morning.     . carvedilol (COREG) 3.125 MG tablet TAKE 1 TABLET BY MOUTH TWICE DAILY WITH A MEAL 60 tablet 6  . Cholecalciferol (VITAMIN D-3) 5000 UNITS TABS  Take 2,000 Int'l Units by mouth every morning.     . fexofenadine (ALLEGRA) 180 MG tablet Take 180 mg by mouth daily as needed for rhinitis.     . furosemide (LASIX) 20 MG tablet Take 1 tablet (20 mg total) by mouth daily. (Patient taking differently: Take 20 mg by mouth daily as needed. ) 30 tablet 6  . latanoprost (XALATAN) 0.005 % ophthalmic solution Place 1 drop into both eyes at bedtime.     [name] LORazepam (ATIVAN) 0.5 MG tablet Take 0.5 mg by mouth every 8 (eight) hours as needed for anxiety.     [name] losartan (COZAAR) 25 MG tablet TAKE 1/2 TABLET(12.5 MG) BY MOUTH DAILY AFTER BREAKFAST 15 tablet 4  . pantoprazole (PROTONIX) 40 MG tablet TAKE 1 TABLET(40 MG) BY MOUTH TWICE DAILY 60 tablet 3  . rosuvastatin (CRESTOR) 10 MG tablet TAKE 1 TABLET(10 MG) BY MOUTH EVERY OTHER DAY 45 tablet 1  . timolol (TIMOPTIC) 0.5 % ophthalmic solution Place 1 drop into both eyes 2 (two)  times daily.     [name] gabapentin (NEURONTIN) 300 MG capsule Take 300 mg by mouth as needed.    [name] oxybutynin (DITROPAN-XL) 10 MG 24 hr tablet Take 1 tablet (10 mg total) by mouth daily. (Patient not taking: Reported on 02/07/2019) 30 tablet 3   No current facility-administered medications for this visit.     PHYSICAL EXAMINATION: ECOG PERFORMANCE STATUS: 0 - Asymptomatic  BP (!) 161/80   Pulse 63   Temp (!) 97.4 F (36.3 C) (Tympanic)   Resp 20   Ht 5\' 1"  (1.549 m)   Wt 104 lb (47.2 kg)   BMI 19.65 kg/m   Filed Weights   02/07/19 1043  Weight: 104 lb (47.2 kg)    Physical Exam  Constitutional: She is oriented to person, place, and time and well-developed, well-nourished, and in no distress.  She is alone.  HENT:  Head: Normocephalic and atraumatic.  Mouth/Throat: Oropharynx is clear and moist. No oropharyngeal exudate.  Eyes: Pupils are equal, round, and reactive to light.  Neck: Normal range of motion. Neck supple.  Cardiovascular: Normal rate and regular rhythm.  Pulmonary/Chest: No respiratory distress. She has no wheezes.  Abdominal: Soft. Bowel sounds are normal. She exhibits no distension and no mass. There is no abdominal tenderness. There is no rebound and no guarding.  Musculoskeletal: Normal range of motion.        General: No tenderness or edema.     Comments: Bilateral flank tenderness.  Lymphadenopathy:  Bilateral axillary adenopathy 1-2 cm in size.   Neurological: She is alert and oriented to person, place, and time.  Skin: Skin is warm.  Psychiatric: Affect normal.     LABORATORY DATA:  I have reviewed the data as listed    Component Value Date/Time   NA 136 02/07/2019 1021   NA 141 09/07/2014 1449   K 4.3 02/07/2019 1021   K 4.0 09/07/2014 1449   CL 102 02/07/2019 1021   CL 106 09/07/2014 1449   CO2 24 02/07/2019 1021   CO2 29 09/07/2014 1449   GLUCOSE 92 02/07/2019 1021   GLUCOSE 109 (H) 09/07/2014 1449   BUN 22 02/07/2019 1021   BUN 19 (H)  09/07/2014 1449   CREATININE 0.90 02/07/2019 1021   CREATININE 1.01 09/07/2014 1449   CALCIUM 9.1 02/07/2019 1021   CALCIUM 8.8 09/07/2014 1449   PROT 7.0 02/07/2019 1021   PROT 6.3 09/02/2015 0824   PROT 6.5 09/07/2014 1449  ALBUMIN 4.1 02/07/2019 1021   ALBUMIN 4.2 09/02/2015 0824   ALBUMIN 3.8 09/07/2014 1449   AST 14 (L) 02/07/2019 1021   AST 11 (L) 09/07/2014 1449   ALT 11 02/07/2019 1021   ALT 22 09/07/2014 1449   ALKPHOS 50 02/07/2019 1021   ALKPHOS 75 09/07/2014 1449   BILITOT 0.9 02/07/2019 1021   BILITOT 0.4 09/02/2015 0824   BILITOT 0.8 09/07/2014 1449   GFRNONAA 60 (L) 02/07/2019 1021   GFRNONAA 56 (L) 09/07/2014 1449   GFRNONAA 44 (L) 02/09/2014 1351   GFRAA >60 02/07/2019 1021   GFRAA >60 09/07/2014 1449   GFRAA 51 (L) 02/09/2014 1351    No results found for: SPEP, UPEP  Lab Results  Component Value Date   WBC 9.1 02/07/2019   NEUTROABS 3.3 02/07/2019   HGB 12.0 02/07/2019   HCT 34.8 (L) 02/07/2019   MCV 93.5 02/07/2019   PLT 138 (L) 02/07/2019      Chemistry      Component Value Date/Time   NA 136 02/07/2019 1021   NA 141 09/07/2014 1449   K 4.3 02/07/2019 1021   K 4.0 09/07/2014 1449   CL 102 02/07/2019 1021   CL 106 09/07/2014 1449   CO2 24 02/07/2019 1021   CO2 29 09/07/2014 1449   BUN 22 02/07/2019 1021   BUN 19 (H) 09/07/2014 1449   CREATININE 0.90 02/07/2019 1021   CREATININE 1.01 09/07/2014 1449      Component Value Date/Time   CALCIUM 9.1 02/07/2019 1021   CALCIUM 8.8 09/07/2014 1449   ALKPHOS 50 02/07/2019 1021   ALKPHOS 75 09/07/2014 1449   AST 14 (L) 02/07/2019 1021   AST 11 (L) 09/07/2014 1449   ALT 11 02/07/2019 1021   ALT 22 09/07/2014 1449   BILITOT 0.9 02/07/2019 1021   BILITOT 0.4 09/02/2015 0824   BILITOT 0.8 09/07/2014 1449      RADIOGRAPHIC STUDIES: I have personally reviewed the radiological images as listed and agreed with the findings in the report. No results found.   ASSESSMENT & PLAN:  Marginal zone  lymphoma of axillary lymph node (HCC) # LOW Grade lymphoma- B-cell non-Hodgkin's lymphoma [follicle lymphoma/marginal zone lymphoma/SLL]. Status post multiple lines of therapy in the past. Currently under surveillance.   # June 16th PET- small lymphadenopathy above and below diaphragm.  Low FDG activity stable.  Patient has vague nonspecific symptoms-fatigue weight loss.  Monitor closely.  # Again reviewed the treatment options for the patient including but not limited to-R-CVP; Gazyva based therapy; lenalidomide rituximab etc. given her different histologist recommend biopsy if progression noted/treatment is recommended.  # mild weight loss/poor appetite/?  Dysphagia-clinically do not suspect from underlying lymphoma.  Discussed referral to dietitian declines.  Recommend follow-up with GI.  # Elevated bLood pressure-recommend compliance checking at home.  # Mild thrombocytopenia- 138; monitor for now.  Overall stable.   # Fatigue-chronic unclear etiology.  Stable.  #Back pain/spasms? Non-specific reaction to PET scan- [allergy to bendaryl]- clairitn day.  Also check a UA.  #I discussed with patient son at length. # I reviewed the blood work- with the patient in detail; also reviewed the imaging independently [as summarized above]; and with the patient in detail.    # DISPOSITION:  # check UA/ culture # Follow-up in approximately 6 months-MD with labs-cbc/cm/ldh;Dr.B      Orders Placed This Encounter  Procedures  . Urine culture    Standing Status:   Future    Number of Occurrences:  1    Standing Expiration Date:   02/07/2020  . Urinalysis, Complete w Microscopic    Standing Status:   Future    Number of Occurrences:   1    Standing Expiration Date:   02/07/2020  . CBC with Differential    Standing Status:   Future    Standing Expiration Date:   02/07/2020  . Comprehensive metabolic panel    Standing Status:   Future    Standing Expiration Date:   02/07/2020  . Lactate  dehydrogenase    Standing Status:   Future    Standing Expiration Date:   02/07/2020   All questions were answered. The patient knows to call the clinic with any problems, questions or concerns.      Cammie Sickle, MD 02/07/2019 12:40 PM

## 2019-02-07 NOTE — Assessment & Plan Note (Addendum)
#   LOW Grade lymphoma- B-cell non-Hodgkin's lymphoma [follicle lymphoma/marginal zone lymphoma/SLL]. Status post multiple lines of therapy in the past. Currently under surveillance.   # June 16th PET- small lymphadenopathy above and below diaphragm.  Low FDG activity stable.  Patient has vague nonspecific symptoms-fatigue weight loss.  Monitor closely.  # Again reviewed the treatment options for the patient including but not limited to-R-CVP; Gazyva based therapy; lenalidomide rituximab etc. given her different histologist recommend biopsy if progression noted/treatment is recommended.  # mild weight loss/poor appetite/?  Dysphagia-clinically do not suspect from underlying lymphoma.  Discussed referral to dietitian declines.  Recommend follow-up with GI.  # Elevated bLood pressure-recommend compliance checking at home.  # Mild thrombocytopenia- 138; monitor for now.  Overall stable.   # Fatigue-chronic unclear etiology.  Stable.  #Back pain/spasms? Non-specific reaction to PET scan- [allergy to bendaryl]- clairitn day.  Also check a UA.  #I discussed with patient son at length. # I reviewed the blood work- with the patient in detail; also reviewed the imaging independently [as summarized above]; and with the patient in detail.    # DISPOSITION:  # check UA/ culture # Follow-up in approximately 6 months-MD with labs-cbc/cm/ldh;Dr.B

## 2019-02-08 LAB — URINE CULTURE: Culture: NO GROWTH

## 2019-03-03 ENCOUNTER — Other Ambulatory Visit: Payer: Medicare Other

## 2019-03-05 ENCOUNTER — Ambulatory Visit: Payer: Medicare Other | Admitting: Internal Medicine

## 2019-03-05 ENCOUNTER — Other Ambulatory Visit: Payer: Medicare Other

## 2019-04-04 ENCOUNTER — Telehealth: Payer: Self-pay | Admitting: Cardiovascular Disease

## 2019-04-04 NOTE — Telephone Encounter (Signed)
° °  [facility] Pre-operative Risk Assessment    Request for surgical clearance:  1. What type of surgery is being performed? EGD    2. When is this surgery scheduled? 04/14/19   3. What type of clearance is required (medical clearance vs. Pharmacy clearance to hold med vs. Both)? Both   4. Are there any medications that need to be held prior to surgery and how long? Please advise if any anticoagulants need instructions for discontinuation and when to resume    5. Practice name and name of physician performing surgery? [facility], at [facility]    6. What is your office phone number [phone]    7.   What is your office fax number [phone] - responsed requested by 8/18  8.   Anesthesia type (None, local, MAC, general) ? Monitored    Jeanne Pittman 04/04/2019, 9:46 AM  _________________________________________________________________   (provider comments below)

## 2019-04-04 NOTE — Telephone Encounter (Signed)
   Primary Cardiologist: Kathlyn Sacramento, MD  Chart reviewed and patient contacted by phone today as part of pre-operative protocol coverage. Given past medical history and time since last visit, based on ACC/AHA guidelines, Jeanne Pittman would be at acceptable risk for the planned procedure without further cardiovascular testing.   OK to hold aspirin 3-5 days pre op if needed.  I will route this recommendation to the requesting party via Epic fax function and remove from pre-op pool.  Please call with questions.  Kerin Ransom, PA-C 04/04/2019, 10:51 AM

## 2019-04-10 ENCOUNTER — Other Ambulatory Visit
Admission: RE | Admit: 2019-04-10 | Discharge: 2019-04-10 | Disposition: A | Discharge status: Home / Self Care | Payer: Medicare Other | Source: Ambulatory Visit | Attending: Internal Medicine | Admitting: Internal Medicine

## 2019-04-10 ENCOUNTER — Other Ambulatory Visit: Payer: Self-pay

## 2019-04-10 DIAGNOSIS — Z01812 Encounter for preprocedural laboratory examination: Secondary | ICD-10-CM | POA: Diagnosis not present

## 2019-04-10 DIAGNOSIS — Z20828 Contact with and (suspected) exposure to other viral communicable diseases: Secondary | ICD-10-CM | POA: Insufficient documentation

## 2019-04-10 LAB — SARS CORONAVIRUS 2 (TAT 6-24 HRS): SARS Coronavirus 2: NEGATIVE

## 2019-04-11 ENCOUNTER — Encounter: Payer: Self-pay | Admitting: *Deleted

## 2019-04-14 ENCOUNTER — Other Ambulatory Visit: Payer: Self-pay

## 2019-04-14 ENCOUNTER — Encounter
Admission: RE | Disposition: A | Discharge status: Home / Self Care | Payer: Self-pay | Source: Home / Self Care | Attending: Internal Medicine

## 2019-04-14 ENCOUNTER — Ambulatory Visit
Admission: RE | Admit: 2019-04-14 | Discharge: 2019-04-14 | Disposition: A | Discharge status: Home / Self Care | Payer: Medicare Other | Attending: Internal Medicine | Admitting: Internal Medicine

## 2019-04-14 ENCOUNTER — Ambulatory Visit: Payer: Medicare Other | Admitting: Certified Registered"

## 2019-04-14 ENCOUNTER — Encounter: Payer: Self-pay | Admitting: *Deleted

## 2019-04-14 DIAGNOSIS — R1314 Dysphagia, pharyngoesophageal phase: Secondary | ICD-10-CM | POA: Diagnosis not present

## 2019-04-14 DIAGNOSIS — Z888 Allergy status to other drugs, medicaments and biological substances status: Secondary | ICD-10-CM | POA: Diagnosis not present

## 2019-04-14 DIAGNOSIS — I2510 Atherosclerotic heart disease of native coronary artery without angina pectoris: Secondary | ICD-10-CM | POA: Insufficient documentation

## 2019-04-14 DIAGNOSIS — Z885 Allergy status to narcotic agent status: Secondary | ICD-10-CM | POA: Diagnosis not present

## 2019-04-14 DIAGNOSIS — Z7982 Long term (current) use of aspirin: Secondary | ICD-10-CM | POA: Diagnosis not present

## 2019-04-14 DIAGNOSIS — Z8572 Personal history of non-Hodgkin lymphomas: Secondary | ICD-10-CM | POA: Diagnosis not present

## 2019-04-14 DIAGNOSIS — Z955 Presence of coronary angioplasty implant and graft: Secondary | ICD-10-CM | POA: Diagnosis not present

## 2019-04-14 DIAGNOSIS — R634 Abnormal weight loss: Secondary | ICD-10-CM | POA: Insufficient documentation

## 2019-04-14 DIAGNOSIS — E785 Hyperlipidemia, unspecified: Secondary | ICD-10-CM | POA: Diagnosis not present

## 2019-04-14 DIAGNOSIS — I5042 Chronic combined systolic (congestive) and diastolic (congestive) heart failure: Secondary | ICD-10-CM | POA: Insufficient documentation

## 2019-04-14 DIAGNOSIS — Z87891 Personal history of nicotine dependence: Secondary | ICD-10-CM | POA: Insufficient documentation

## 2019-04-14 DIAGNOSIS — G629 Polyneuropathy, unspecified: Secondary | ICD-10-CM | POA: Diagnosis not present

## 2019-04-14 DIAGNOSIS — M1990 Unspecified osteoarthritis, unspecified site: Secondary | ICD-10-CM | POA: Diagnosis not present

## 2019-04-14 DIAGNOSIS — I255 Ischemic cardiomyopathy: Secondary | ICD-10-CM | POA: Insufficient documentation

## 2019-04-14 DIAGNOSIS — Z681 Body mass index (BMI) 19 or less, adult: Secondary | ICD-10-CM | POA: Diagnosis not present

## 2019-04-14 DIAGNOSIS — F419 Anxiety disorder, unspecified: Secondary | ICD-10-CM | POA: Insufficient documentation

## 2019-04-14 DIAGNOSIS — R1310 Dysphagia, unspecified: Secondary | ICD-10-CM | POA: Diagnosis present

## 2019-04-14 DIAGNOSIS — I252 Old myocardial infarction: Secondary | ICD-10-CM | POA: Diagnosis not present

## 2019-04-14 DIAGNOSIS — H409 Unspecified glaucoma: Secondary | ICD-10-CM | POA: Insufficient documentation

## 2019-04-14 DIAGNOSIS — G2581 Restless legs syndrome: Secondary | ICD-10-CM | POA: Insufficient documentation

## 2019-04-14 DIAGNOSIS — Z79899 Other long term (current) drug therapy: Secondary | ICD-10-CM | POA: Diagnosis not present

## 2019-04-14 DIAGNOSIS — I110 Hypertensive heart disease with heart failure: Secondary | ICD-10-CM | POA: Insufficient documentation

## 2019-04-14 DIAGNOSIS — K219 Gastro-esophageal reflux disease without esophagitis: Secondary | ICD-10-CM | POA: Insufficient documentation

## 2019-04-14 HISTORY — DX: Age-related osteoporosis without current pathological fracture: M81.0

## 2019-04-14 HISTORY — DX: Dermatitis, unspecified: L30.9

## 2019-04-14 HISTORY — DX: Gastritis, unspecified, without bleeding: K29.70

## 2019-04-14 HISTORY — PX: ESOPHAGOGASTRODUODENOSCOPY (EGD) WITH PROPOFOL: SHX5813

## 2019-04-14 HISTORY — DX: Headache, unspecified: R51.9

## 2019-04-14 LAB — GLUCOSE, CAPILLARY
Glucose-Capillary: 95 mg/dL (ref 70–99)
Glucose-Capillary: 88 mg/dL (ref 70–99)

## 2019-04-14 SURGERY — ESOPHAGOGASTRODUODENOSCOPY (EGD) WITH PROPOFOL
Anesthesia: General

## 2019-04-14 MED ORDER — LIDOCAINE HCL (CARDIAC) PF 100 MG/5ML IV SOSY
PREFILLED_SYRINGE | INTRAVENOUS | Status: DC | PRN
  Administered 2019-04-14: 60 mg via INTRATRACHEAL

## 2019-04-14 MED ORDER — PROPOFOL 10 MG/ML IV BOLUS
INTRAVENOUS | Status: DC | PRN
  Administered 2019-04-14: 20 mg via INTRAVENOUS
  Administered 2019-04-14: 50 mg via INTRAVENOUS

## 2019-04-14 MED ORDER — SODIUM CHLORIDE 0.9 % IV SOLN
INTRAVENOUS | Status: DC
  Administered 2019-04-14: 11:00:00 via INTRAVENOUS

## 2019-04-14 MED ORDER — PROPOFOL 10 MG/ML IV BOLUS
INTRAVENOUS | Status: AC
  Filled 2019-04-14: qty 20

## 2019-04-14 NOTE — Transfer of Care (Signed)
Immediate Anesthesia Transfer of Care Note  Patient: Jeanne Pittman  Procedure(s) Performed: ESOPHAGOGASTRODUODENOSCOPY (EGD) WITH PROPOFOL (N/A )  Patient Location: Endoscopy Unit  Anesthesia Type:General  Level of Consciousness: awake  Airway & Oxygen Therapy: Patient Spontanous Breathing  Post-op Assessment: Report given to RN and Post -op Vital signs reviewed and stable  Post vital signs: Reviewed and stable  Last Vitals:  Vitals Value Taken Time  BP    Temp    Pulse    Resp    SpO2      Last Pain:  Vitals:   04/14/19 1015  TempSrc: Tympanic         Complications: No apparent anesthesia complications

## 2019-04-14 NOTE — Anesthesia Preprocedure Evaluation (Signed)
Anesthesia Evaluation  Patient identified by MRN, date of birth, ID band Patient awake    Reviewed: Allergy & Precautions, H&P , NPO status , Patient's Chart, lab work & pertinent test results  Airway Mallampati: II  TM Distance: >3 FB Neck ROM: full    Dental  (+) Chipped   Pulmonary neg pulmonary ROS, neg shortness of breath, neg COPD, neg recent URI, former smoker,           Cardiovascular hypertension, + CAD, + Past MI, + Cardiac Stents (2015) and +CHF       Neuro/Psych  Headaches, negative psych ROS   GI/Hepatic Neg liver ROS, GERD  ,  Endo/Other  Hypothyroidism   Renal/GU negative Renal ROS  negative genitourinary   Musculoskeletal   Abdominal   Peds  Hematology negative hematology ROS (+)   Anesthesia Other Findings Past Medical History: No date: A-fib [facility])     Comment:  07/2012: A. fib with RVR in the setting of myocardial               infarction. Converted to sinus rhythm with amiodarone. No              recurrence. No date: Anxiety No date: ASCVD (arteriosclerotic cardiovascular disease) No date: Atrophic vaginitis No date: CHF (congestive heart failure) (HCC) No date: Chronic combined systolic (congestive) and diastolic  (congestive) heart failure (HCC)     Comment:  a. EF was 25-35% post MI but improved to 45-50% in               04/2013; b. 03/2017 Echo: EF 40-45%, mod focal/basal               hypertrophy of septum. Sev mid-apicalanteroseptal, ant,               and apical HK. Gr1 DD, mild AI/MR, mod TR, PASP 79mHg. No date: Colon adenomas No date: Coronary artery disease     Comment:  a. [id number] STEMI complicated by cardiogenic shock.               Cath/PCI: LAD 135m (DESx2), RCA 95p(DES). EF 25%; b.               03/2017 MV: EF 57%, fixed apical, periapical, mid to               distal anteroseptal defect. No ischemia. No date: Dysrhythmia     Comment:  A-fib No date: Eczema No date:  Fibrocystic breast disease No date: Gastritis No date: GERD (gastroesophageal reflux disease) No date: GERD (gastroesophageal reflux disease) No date: Glaucoma No date: Headache No date: History of gastritis No date: Hyperlipidemia No date: Hypertension No date: Hypothyroidism No date: Insomnia No date: Ischemic cardiomyopathy     Comment:  a. 07/2012 EF 25-35% following MI-->Improved to 45-50%;               b. 03/2017 Echo: EF 40-45%. No date: Lymphoma ([place]) 08/18/2014: Myocardial infarction [facility]) 2005: Non Hodgkin's lymphoma ([place])     Comment:  reoccurance 2007 and 2012 No date: Osteoarthritis No date: Osteoporosis No date: Polyneuropathy No date: Polyneuropathy No date: Polyposis of colon No date: Restless leg syndrome No date: Urinary, incontinence, stress female  Past Surgical History: 1956: ABDOMINAL HYSTERECTOMY No date: APPENDECTOMY 03/18/2015: AXILLARY LYMPH NODE BIOPSY; Left     Comment:  Procedure: AXILLARY LYMPH NODE BIOPSY;  Surgeon: [name],  MD;  Location: [name];  Service: General;                Laterality: Left; 08/19/2012: CARDIAC CATHETERIZATION     Comment:  [place]; [place] No date: COLONOSCOPY 03/02/2016: COLONOSCOPY WITH PROPOFOL; N/A     Comment:  Procedure: COLONOSCOPY WITH PROPOFOL;  Surgeon: Manya Silvas, MD;  Location: [facility] ENDOSCOPY;  Service:               Endoscopy;  Laterality: N/A; 09/26/2017: COLONOSCOPY WITH PROPOFOL; N/A     Comment:  Procedure: COLONOSCOPY WITH PROPOFOL;  Surgeon: Manya Silvas, MD;  Location: [facility] ENDOSCOPY;  Service:               Endoscopy;  Laterality: N/A; No date: CORONARY ANGIOPLASTY WITH STENT PLACEMENT     Comment:  x3 stents No date: CORONARY ANGIOPLASTY WITH STENT PLACEMENT No date: CORONARY ARTERY BYPASS GRAFT 03/02/2016: ESOPHAGOGASTRODUODENOSCOPY (EGD) WITH PROPOFOL; N/A     Comment:  Procedure: ESOPHAGOGASTRODUODENOSCOPY (EGD) WITH                PROPOFOL;  Surgeon: Manya Silvas, MD;  Location: [facility]              ENDOSCOPY;  Service: Endoscopy;  Laterality: N/A; 07-24-11: LYMPH NODE BIOPSY; Right     Comment:  Dr Bary Castilla No date: MOHS SURGERY     Comment:  bilateral shoulders No date: PTCA No date: skin cancer removal No date: TOTAL ABDOMINAL HYSTERECTOMY W/ BILATERAL SALPINGOOPHORECTOMY  BMI    Body Mass Index: 19.65 kg/m      Reproductive/Obstetrics negative OB ROS                             Anesthesia Physical Anesthesia Plan  ASA: III  Anesthesia Plan: General   Post-op Pain Management:    Induction:   PONV Risk Score and Plan: Propofol infusion and TIVA  Airway Management Planned: Natural Airway and Nasal Cannula  Additional Equipment:   Intra-op Plan:   Post-operative Plan:   Informed Consent: I have reviewed the patients History and Physical, chart, labs and discussed the procedure including the risks, benefits and alternatives for the proposed anesthesia with the patient or authorized representative who has indicated his/her understanding and acceptance.     Dental Advisory Given  Plan Discussed with: Anesthesiologist and CRNA  Anesthesia Plan Comments:         Anesthesia Quick Evaluation

## 2019-04-14 NOTE — Interval H&P Note (Signed)
History and Physical Interval Note:  04/14/2019 1:30 PM  Jeanne Pittman  has presented today for surgery, with the diagnosis of GERD DYSPHAGIA.  The various methods of treatment have been discussed with the patient and family. After consideration of risks, benefits and other options for treatment, the patient has consented to  Procedure(s): ESOPHAGOGASTRODUODENOSCOPY (EGD) WITH PROPOFOL (N/A) as a surgical intervention.  The patient's history has been reviewed, patient examined, no change in status, stable for surgery.  I have reviewed the patient's chart and labs.  Questions were answered to the patient's satisfaction.     [place], [place]

## 2019-04-14 NOTE — Anesthesia Post-op Follow-up Note (Signed)
Anesthesia QCDR form completed.        

## 2019-04-14 NOTE — Op Note (Signed)
[facility] Gastroenterology Patient Name: Jeanne Pittman Procedure Date: 04/14/2019 10:58 AM MRN: [id number] Account #: [id number] Date of Birth: [birth year] Admit Type: Outpatient Age: 84 Room: [facility] ENDO ROOM 1 Gender: Female Note Status: Finalized Procedure:            Upper GI endoscopy Indications:          Dysphagia, Weight loss Providers:            Benay Pike. Teofila Bowery MD, MD Medicines:            Propofol per Anesthesia Complications:        No immediate complications. Procedure:            Pre-Anesthesia Assessment:                       - The risks and benefits of the procedure and the                        sedation options and risks were discussed with the                        patient. All questions were answered and informed                        consent was obtained.                       - Patient identification and proposed procedure were                        verified prior to the procedure by the nurse. The                        procedure was verified in the procedure room.                       - After reviewing the risks and benefits, the patient                        was deemed in satisfactory condition to undergo the                        procedure.                       - ASA Grade Assessment: III - A patient with severe                        systemic disease.                       After obtaining informed consent, the endoscope was                        passed under direct vision. Throughout the procedure,                        the patient's blood pressure, pulse, and oxygen                        saturations were monitored continuously. The Endoscope  was introduced through the mouth, and advanced to the                        third part of duodenum. The upper GI endoscopy was                        accomplished without difficulty. The patient tolerated                        the procedure well. Findings:  Moderate tortuosity of the mid to distal esophagus was noted compatible       with a diagnosis of Presbyesophagus.      There is no endoscopic evidence of stenosis, stricture, ulcerations or       mass in the entire esophagus.      The entire examined stomach was normal.      The examined duodenum was normal.      The exam was otherwise without abnormality. Impression:           - Normal stomach.                       - Normal examined duodenum.                       - The examination was otherwise normal.                       - No specimens collected. Recommendation:       - Patient has a contact number available for                        emergencies. The signs and symptoms of potential                        delayed complications were discussed with the patient.                        Return to normal activities tomorrow. Written discharge                        instructions were provided to the patient.                       - Resume previous diet.                       - Continue present medications.                       - Perform ambulatory esophageal manometry at                        appointment to be scheduled.                       - Return to physician assistant in 3 months.                       - The findings and recommendations were discussed with                        the patient. Procedure Code(s):    ---  Professional ---                       [phone], Esophagogastroduodenoscopy, flexible, transoral;                        diagnostic, including collection of specimen(s) by                        brushing or washing, when performed (separate procedure) Diagnosis Code(s):    --- Professional ---                       R63.4, Abnormal weight loss                       R13.10, Dysphagia, unspecified CPT copyright 2019 American Medical Association. All rights reserved. The codes documented in this report are preliminary and upon coder review may  be revised to meet current  compliance requirements. Efrain Sella MD, MD 04/14/2019 11:27:06 AM This report has been signed electronically. Number of Addenda: 0 Note Initiated On: 04/14/2019 10:58 AM Estimated Blood Loss: Estimated blood loss: none.      [facility]

## 2019-04-14 NOTE — H&P (Signed)
Outpatient short stay form Pre-procedure 04/14/2019 1:29 PM Jeanne Pittman K. Alice Reichert, M.D.  Primary Physician: Fulton Reek, M.D.  Reason for visit:  Dysphagia  History of present illness: Patient with 3 months of pharyngoesophageal dysphagia and a 6lb weight loss. No hemetemesis, abdominal pain. There is some anorexia.   Current Facility-Administered Medications:  .  0.9 %  sodium chloride infusion, , Intravenous, Continuous, [place], Benay Pike, MD, Last Rate: 20 mL/hr at 04/14/19 1041  Current Outpatient Medications:  .  carvedilol (COREG) 3.125 MG tablet, TAKE 1 TABLET BY MOUTH TWICE DAILY WITH A MEAL, Disp: 60 tablet, Rfl: 6 .  Cholecalciferol (VITAMIN D-3) 5000 UNITS TABS, Take 2,000 Int'l Units by mouth every morning. , Disp: , Rfl:  .  LORazepam (ATIVAN) 0.5 MG tablet, Take 0.5 mg by mouth every 8 (eight) hours as needed for anxiety. , Disp: , Rfl:  .  losartan (COZAAR) 25 MG tablet, TAKE 1/2 TABLET(12.5 MG) BY MOUTH DAILY AFTER BREAKFAST, Disp: 15 tablet, Rfl: 4 .  pantoprazole (PROTONIX) 40 MG tablet, TAKE 1 TABLET(40 MG) BY MOUTH TWICE DAILY, Disp: 60 tablet, Rfl: 3 .  timolol (TIMOPTIC) 0.5 % ophthalmic solution, Place 1 drop into both eyes 2 (two) times daily. , Disp: , Rfl:  .  acetaminophen (TYLENOL) 650 MG CR tablet, Take 650 mg by mouth every 8 (eight) hours as needed for pain., Disp: , Rfl:  .  aspirin 81 MG tablet, Take 81 mg by mouth every morning. , Disp: , Rfl:  .  fexofenadine (ALLEGRA) 180 MG tablet, Take 180 mg by mouth daily as needed for rhinitis. , Disp: , Rfl:  .  furosemide (LASIX) 20 MG tablet, Take 1 tablet (20 mg total) by mouth daily. (Patient taking differently: Take 20 mg by mouth daily as needed. ), Disp: 30 tablet, Rfl: 6 .  gabapentin (NEURONTIN) 300 MG capsule, Take 300 mg by mouth as needed., Disp: , Rfl:  .  latanoprost (XALATAN) 0.005 % ophthalmic solution, Place 1 drop into both eyes at bedtime. , Disp: , Rfl:  .  oxybutynin (DITROPAN-XL) 10 MG 24 hr  tablet, Take 1 tablet (10 mg total) by mouth daily. (Patient not taking: Reported on 02/07/2019), Disp: 30 tablet, Rfl: 3 .  rosuvastatin (CRESTOR) 10 MG tablet, TAKE 1 TABLET(10 MG) BY MOUTH EVERY OTHER DAY, Disp: 45 tablet, Rfl: 1  No medications prior to admission.     Allergies  Allergen Reactions  . Ace Inhibitors     Other reaction(s): Cough  . Benadryl [Diphenhydramine Hcl]     Other reaction(s): Unknown  . Codeine     Other reaction(s): Hallucination  . Diphenhydramine   . Guaiacol   . Hydrocodone     Other reaction(s): Hallucination  . Guaifenesin & Derivatives Rash     Past Medical History:  Diagnosis Date  . A-fib ([place])    07/2012: A. fib with RVR in the setting of myocardial infarction. Converted to sinus rhythm with amiodarone. No recurrence.  . Anxiety   . ASCVD (arteriosclerotic cardiovascular disease)   . Atrophic vaginitis   . CHF (congestive heart failure) ([place])   . Chronic combined systolic (congestive) and diastolic (congestive) heart failure ([place])    a. EF was 25-35% post MI but improved to 45-50% in 04/2013; b. 03/2017 Echo: EF 40-45%, mod focal/basal hypertrophy of septum. Sev mid-apicalanteroseptal, ant, and apical HK. Gr1 DD, mild AI/MR, mod TR, PASP 5mHg.  . Colon adenomas   . Coronary artery disease    a. 07/2012 STEMI  complicated by cardiogenic shock. Cath/PCI: LAD 156m (DESx2), RCA 95p(DES). EF 25%; b. 03/2017 MV: EF 57%, fixed apical, periapical, mid to distal anteroseptal defect. No ischemia.  [name] Dysrhythmia    A-fib  . Eczema   . Fibrocystic breast disease   . Gastritis   . GERD (gastroesophageal reflux disease)   . GERD (gastroesophageal reflux disease)   . Glaucoma   . Headache   . History of gastritis   . Hyperlipidemia   . Hypertension   . Hypothyroidism   . Insomnia   . Ischemic cardiomyopathy    a. 07/2012 EF 25-35% following MI-->Improved to 45-50%;  b. 03/2017 Echo: EF 40-45%.  [name] Lymphoma ([place])   . Myocardial infarction ([place])  08/18/2014  . Non Hodgkin's lymphoma ([place]) 2005   reoccurance 2007 and 2012  . Osteoarthritis   . Osteoporosis   . Polyneuropathy   . Polyneuropathy   . Polyposis of colon   . Restless leg syndrome   . Urinary, incontinence, stress female     Review of systems:  Otherwise negative.    Physical Exam  Gen: Alert, oriented. Appears stated age.  HEENT: [place]/AT. PERRLA. Lungs: CTA, no wheezes. CV: RR nl S1, S2. Abd: soft, benign, no masses. BS+ Ext: No edema. Pulses 2+    Planned procedures: Proceed with EGD. The patient understands the nature of the planned procedure, indications, risks, alternatives and potential complications including but not limited to bleeding, infection, perforation, damage to internal organs and possible oversedation/side effects from anesthesia. The patient agrees and gives consent to proceed.  Please refer to procedure notes for findings, recommendations and patient disposition/instructions.     Jeanne Pittman K. Alice Reichert, M.D. Gastroenterology 04/14/2019  1:29 PM

## 2019-04-15 ENCOUNTER — Encounter: Payer: Self-pay | Admitting: Internal Medicine

## 2019-04-15 NOTE — Anesthesia Postprocedure Evaluation (Signed)
Anesthesia Post Note  Patient: Jeanne Pittman  Procedure(s) Performed: ESOPHAGOGASTRODUODENOSCOPY (EGD) WITH PROPOFOL (N/A )  Patient location during evaluation: PACU Anesthesia Type: General Level of consciousness: awake and alert Pain management: pain level controlled Vital Signs Assessment: post-procedure vital signs reviewed and stable Respiratory status: spontaneous breathing, nonlabored ventilation and respiratory function stable Cardiovascular status: blood pressure returned to baseline and stable Postop Assessment: no apparent nausea or vomiting Anesthetic complications: no     Last Vitals:  Vitals:   04/14/19 1208 04/14/19 1213  BP: 122/60   Pulse: 67 65  Resp: 19 16  Temp:    SpO2: 99% 99%    Last Pain:  Vitals:   04/15/19 0731  TempSrc:   PainSc: 0-No pain                 Durenda Hurt

## 2019-05-14 ENCOUNTER — Other Ambulatory Visit: Payer: Self-pay | Admitting: Neurology

## 2019-05-14 DIAGNOSIS — R2689 Other abnormalities of gait and mobility: Secondary | ICD-10-CM

## 2019-05-17 ENCOUNTER — Other Ambulatory Visit: Payer: Self-pay | Admitting: Cardiovascular Disease

## 2019-05-27 ENCOUNTER — Ambulatory Visit: Payer: Medicare Other

## 2019-06-02 ENCOUNTER — Other Ambulatory Visit: Payer: Self-pay

## 2019-06-02 ENCOUNTER — Ambulatory Visit
Admission: RE | Admit: 2019-06-02 | Discharge: 2019-06-02 | Disposition: A | Discharge status: Home / Self Care | Payer: Medicare Other | Source: Ambulatory Visit | Attending: Neurology | Admitting: Neurology

## 2019-06-02 DIAGNOSIS — R2689 Other abnormalities of gait and mobility: Secondary | ICD-10-CM | POA: Diagnosis present

## 2019-06-11 ENCOUNTER — Other Ambulatory Visit: Payer: Self-pay

## 2019-06-11 MED ORDER — LOSARTAN POTASSIUM 25 MG PO TABS: ORAL_TABLET | ORAL | 4 refills | Status: DC

## 2019-06-26 ENCOUNTER — Telehealth: Payer: Self-pay

## 2019-06-26 ENCOUNTER — Other Ambulatory Visit: Payer: Self-pay | Admitting: Internal Medicine

## 2019-06-26 DIAGNOSIS — Z1231 Encounter for screening mammogram for malignant neoplasm of breast: Secondary | ICD-10-CM

## 2019-06-26 MED ORDER — ROSUVASTATIN CALCIUM 10 MG PO TABS: ORAL_TABLET | ORAL | 0 refills | Status: DC

## 2019-06-26 NOTE — Telephone Encounter (Signed)
Requested Prescriptions   Signed Prescriptions Disp Refills  . rosuvastatin (CRESTOR) 10 MG tablet 45 tablet 0    Sig: TAKE 1 TABLET(10 MG) BY MOUTH EVERY OTHER DAY *NEEDS OFFICE VISIT FOR FURTHER REFILLS-PLEASE CALL [phone] TO SCHEDULE.*    Authorizing Provider: Kathlyn Sacramento A    Ordering User: Raelene Bott, BRANDY L

## 2019-07-14 ENCOUNTER — Telehealth: Payer: Self-pay

## 2019-07-14 MED ORDER — PANTOPRAZOLE SODIUM 40 MG PO TBEC: DELAYED_RELEASE_TABLET | ORAL | 0 refills | Status: DC

## 2019-07-14 NOTE — Telephone Encounter (Signed)
Requested Prescriptions   Signed Prescriptions Disp Refills  . pantoprazole (PROTONIX) 40 MG tablet 60 tablet 0    Sig: TAKE 1 TABLET(40 MG) BY MOUTH TWICE DAILY *NEEDS OFFICE VISIT FOR FURTHER REFILLS*    Authorizing Provider: Kathlyn Sacramento A    Ordering User: Raelene Bott, BRANDY L

## 2019-07-23 ENCOUNTER — Encounter: Payer: Self-pay | Admitting: Nurse Practitioner

## 2019-07-23 ENCOUNTER — Ambulatory Visit (INDEPENDENT_AMBULATORY_CARE_PROVIDER_SITE_OTHER): Payer: Medicare Other | Admitting: Nurse Practitioner

## 2019-07-23 ENCOUNTER — Other Ambulatory Visit: Payer: Self-pay

## 2019-07-23 VITALS — BP 120/60 | HR 79 | Temp 96.5°F | Ht 61.0 in | Wt 103.0 lb

## 2019-07-23 DIAGNOSIS — E785 Hyperlipidemia, unspecified: Secondary | ICD-10-CM

## 2019-07-23 DIAGNOSIS — I255 Ischemic cardiomyopathy: Secondary | ICD-10-CM

## 2019-07-23 DIAGNOSIS — I5022 Chronic systolic (congestive) heart failure: Secondary | ICD-10-CM | POA: Diagnosis not present

## 2019-07-23 DIAGNOSIS — I10 Essential (primary) hypertension: Secondary | ICD-10-CM

## 2019-07-23 DIAGNOSIS — I2510 Atherosclerotic heart disease of native coronary artery without angina pectoris: Secondary | ICD-10-CM | POA: Diagnosis not present

## 2019-07-23 DIAGNOSIS — R0600 Dyspnea, unspecified: Secondary | ICD-10-CM

## 2019-07-23 MED ORDER — PANTOPRAZOLE SODIUM 40 MG PO TBEC: DELAYED_RELEASE_TABLET | ORAL | 1 refills | Status: DC

## 2019-07-23 MED ORDER — ROSUVASTATIN CALCIUM 10 MG PO TABS: ORAL_TABLET | ORAL | 3 refills | Status: DC

## 2019-07-23 NOTE — Patient Instructions (Signed)
Medication Instructions:  - Your physician recommends that you continue on your current medications as directed. Please refer to the Current Medication list given to you today.  *If you need a refill on your cardiac medications before your next appointment, please call your pharmacy*  Lab Work: - none ordered  If you have labs (blood work) drawn today and your tests are completely normal, you will receive your results only by: [name] MyChart Message (if you have MyChart) OR . A paper copy in the mail If you have any lab test that is abnormal or we need to change your treatment, we will call you to review the results.  Testing/Procedures: - Your physician has requested that you have an echocardiogram. Echocardiography is a painless test that uses sound waves to create images of your heart. It provides your doctor with information about the size and shape of your heart and how well your heart's chambers and valves are working. This procedure takes approximately one hour. There are no restrictions for this procedure.  Follow-Up: At [facility], you and your health needs are our priority.  As part of our continuing mission to provide you with exceptional heart care, we have created designated Provider Care Teams.  These Care Teams include your primary Cardiologist (physician) and Advanced Practice Providers (APPs -  Physician Assistants and Nurse Practitioners) who all work together to provide you with the care you need, when you need it.  Your next appointment:   1 month(s)  The format for your next appointment:   In Person  Provider:    You may see Kathlyn Sacramento, MD or one of the following Advanced Practice Providers on your designated Care Team:    Murray Hodgkins, NP  Christell Faith, PA-C  Marrianne Mood, PA-C   Other Instructions N/a   Echocardiogram An echocardiogram is a procedure that uses painless sound waves (ultrasound) to produce an image of the heart. Images from an  echocardiogram can provide important information about:  Signs of coronary artery disease (CAD).  Aneurysm detection. An aneurysm is a weak or damaged part of an artery wall that bulges out from the normal force of blood pumping through the body.  Heart size and shape. Changes in the size or shape of the heart can be associated with certain conditions, including heart failure, aneurysm, and CAD.  Heart muscle function.  Heart valve function.  Signs of a past heart attack.  Fluid buildup around the heart.  Thickening of the heart muscle.  A tumor or infectious growth around the heart valves. Tell a health care provider about:  Any allergies you have.  All medicines you are taking, including vitamins, herbs, eye drops, creams, and over-the-counter medicines.  Any blood disorders you have.  Any surgeries you have had.  Any medical conditions you have.  Whether you are pregnant or may be pregnant. What are the risks? Generally, this is a safe procedure. However, problems may occur, including:  Allergic reaction to dye (contrast) that may be used during the procedure. What happens before the procedure? No specific preparation is needed. You may eat and drink normally. What happens during the procedure?   An IV tube may be inserted into one of your veins.  You may receive contrast through this tube. A contrast is an injection that improves the quality of the pictures from your heart.  A gel will be applied to your chest.  A wand-like tool (transducer) will be moved over your chest. The gel will help  to transmit the sound waves from the transducer.  The sound waves will harmlessly bounce off of your heart to allow the heart images to be captured in real-time motion. The images will be recorded on a computer. The procedure may vary among health care providers and hospitals. What happens after the procedure?  You may return to your normal, everyday life, including diet,  activities, and medicines, unless your health care provider tells you not to do that. Summary  An echocardiogram is a procedure that uses painless sound waves (ultrasound) to produce an image of the heart.  Images from an echocardiogram can provide important information about the size and shape of your heart, heart muscle function, heart valve function, and fluid buildup around your heart.  You do not need to do anything to prepare before this procedure. You may eat and drink normally.  After the echocardiogram is completed, you may return to your normal, everyday life, unless your health care provider tells you not to do that. This information is not intended to replace advice given to you by your health care provider. Make sure you discuss any questions you have with your health care provider. Document Released: 08/04/2000 Document Revised: 11/28/2018 Document Reviewed: 09/09/2016 Elsevier Patient Education  2020 [employer].

## 2019-07-23 NOTE — Progress Notes (Signed)
Office Visit    Patient Name: Jeanne Pittman Date of Encounter: 07/23/2019  Primary Care Provider:  [name], MD Primary Cardiologist:  Kathlyn Sacramento, MD  Chief Complaint    84 y/o ? w/ a h/o CAD status post STEMI in December 2013 with LAD and RCA stenting, chronic combined systolic and diastolic congestive heart failure, ischemic cardiomyopathy, paroxysmal atrial fibrillation in the setting of myocardial infarction, hypertension, hyperlipidemia, hypothyroidism, non-Hodgkin's lymphoma, and GERD, who presents for follow-up related to [place].  Past Medical History    Past Medical History:  Diagnosis Date  . A-fib ([place])    07/2012: A. fib with RVR in the setting of myocardial infarction. Converted to sinus rhythm with amiodarone. No recurrence.  . Anxiety   . Atrophic vaginitis   . CHF (congestive heart failure) ([place])   . Chronic combined systolic (congestive) and diastolic (congestive) heart failure ([place])    a. EF was 25-35% post MI but improved to 45-50% in 04/2013; b. 03/2017 Echo: EF 40-45%, mod focal/basal hypertrophy of septum. Sev mid-apicalanteroseptal, ant, and apical HK. Gr1 DD, mild AI/MR, mod TR, PASP 43mHg.  . Colon adenomas   . Coronary artery disease    a. [id number] STEMI complicated by cardiogenic shock. Cath/PCI: LAD 147m (DESx2), RCA 95p(DES). EF 25%; b. 03/2017 MV: EF 57%, fixed apical, periapical, mid to distal anteroseptal defect. No ischemia.  . Eczema   . Fibrocystic breast disease   . Gastritis   . GERD (gastroesophageal reflux disease)   . Glaucoma   . Headache   . History of gastritis   . Hyperlipidemia   . Hypertension   . Hypothyroidism   . Insomnia   . Ischemic cardiomyopathy    a. 07/2012 EF 25-35% following MI-->Improved to 45-50%;  b. 03/2017 Echo: EF 40-45%.  [name] Lymphoma ([place])   . Myocardial infarction ([place]) 08/18/2014  . Non Hodgkin's lymphoma ([place]) 2005   reoccurance 2007 and 2012  . Osteoarthritis   . Osteoporosis   . Polyneuropathy    . Polyposis of colon   . Restless leg syndrome   . Urinary, incontinence, stress female    Past Surgical History:  Procedure Laterality Date  . ABDOMINAL HYSTERECTOMY  1956  . APPENDECTOMY    . AXILLARY LYMPH NODE BIOPSY Left 03/18/2015   Procedure: AXILLARY LYMPH NODE BIOPSY;  Surgeon: [name], MD;  Location: [name];  Service: General;  Laterality: Left;  . CARDIAC CATHETERIZATION  08/19/2012   [name]; [place]  . COLONOSCOPY    . COLONOSCOPY WITH PROPOFOL N/A 03/02/2016   Procedure: COLONOSCOPY WITH PROPOFOL;  Surgeon: Manya Silvas, MD;  Location: [facility] ENDOSCOPY;  Service: Endoscopy;  Laterality: N/A;  . COLONOSCOPY WITH PROPOFOL N/A 09/26/2017   Procedure: COLONOSCOPY WITH PROPOFOL;  Surgeon: Manya Silvas, MD;  Location: [facility] ENDOSCOPY;  Service: Endoscopy;  Laterality: N/A;  . CORONARY ANGIOPLASTY WITH STENT PLACEMENT     x3 stents  . CORONARY ANGIOPLASTY WITH STENT PLACEMENT    . CORONARY ARTERY BYPASS GRAFT    . ESOPHAGOGASTRODUODENOSCOPY (EGD) WITH PROPOFOL N/A 03/02/2016   Procedure: ESOPHAGOGASTRODUODENOSCOPY (EGD) WITH PROPOFOL;  Surgeon: Manya Silvas, MD;  Location: [facility] ENDOSCOPY;  Service: Endoscopy;  Laterality: N/A;  . ESOPHAGOGASTRODUODENOSCOPY (EGD) WITH PROPOFOL N/A 04/14/2019   Procedure: ESOPHAGOGASTRODUODENOSCOPY (EGD) WITH PROPOFOL;  Surgeon: Tol[place], Benay Pike, MD;  Location: [name] ENDOSCOPY;  Service: Gastroenterology;  Laterality: N/A;  . LYMPH NODE BIOPSY Right 07-24-11   Dr Bary Castilla  . MOHS SURGERY  bilateral shoulders  . PTCA    . skin cancer removal    . TOTAL ABDOMINAL HYSTERECTOMY W/ BILATERAL SALPINGOOPHORECTOMY      Allergies  Allergies  Allergen Reactions  . Ace Inhibitors     Other reaction(s): Cough  . Benadryl [Diphenhydramine Hcl]     Other reaction(s): Unknown  . Codeine     Other reaction(s): Hallucination  . Diphenhydramine   . Guaiacol   . Hydrocodone     Other reaction(s): Hallucination  . Guaifenesin &  Derivatives Rash    History of Present Illness    84 y/o ? w/ a h/o CAD status post STEMI in December 2013 with LAD and RCA stenting, chronic combined systolic and diastolic congestive heart failure, ischemic cardiomyopathy, paroxysmal atrial fibrillation in the setting of myocardial infarction, hypertension, hyperlipidemia, hypothyroidism, non-Hodgkin's lymphoma, and GERD.  She suffered an anterior STEMI in December 2013 with late presentation complicated by cardiogenic shock.  Emergent catheterization revealed an occluded mid LAD and a 95% stenosis in the proximal RCA.  2 drug-eluting stents were placed in the mid LAD and one was placed in the proximal RCA.  EF was 25 to 30% with apical and inferior wall motion abnormalities at the time however follow-up echocardiogram in August 2018, showed an improved EF of 40-45%.    She was last seen in cardiology clinic in March, at which time she was doing well.  Following the onset of COVID-19 however, her activity levels have dropped significantly as she was not able to participate in cardiac rehabilitation.  With this, she has noted a decline in exercise tolerance, specifically over the past 3 months.  She says she can now only walk about 50 yards prior to experiencing dyspnea on exertion causing her to stop and rest.  She does not experience chest pain.  Her appetite has also fallen off and her weight is down 15 pounds over the past 2 years, though overall its been relatively stable over the past year.  She attributes dyspnea, fatigue, and poor appetite to her lymphoma.  PET scan in June of this year showed mildly hypermetabolic adenopathy in the neck, chest, abdomen, and pelvis, which was overall stable from April 2019.  Despite dyspnea and fatigue, she denies chest pain, palpitations, PND, orthopnea, dizziness, syncope, edema, or early satiety.  Home Medications    Prior to Admission medications   Medication Sig Start Date End Date Taking? Authorizing  Provider  acetaminophen (TYLENOL) 650 MG CR tablet Take 650 mg by mouth every 8 (eight) hours as needed for pain.    [provider]  aspirin 81 MG tablet Take 81 mg by mouth every morning.     [provider]  carvedilol (COREG) 3.125 MG tablet TAKE 1 TABLET BY MOUTH TWICE DAILY WITH A MEAL 05/19/19   Wellington Hampshire, MD  Cholecalciferol (VITAMIN D-3) 5000 UNITS TABS Take 2,000 Int'l Units by mouth every morning.     [provider]  fexofenadine (ALLEGRA) 180 MG tablet Take 180 mg by mouth daily as needed for rhinitis.     [provider]  furosemide (LASIX) 20 MG tablet Take 1 tablet (20 mg total) by mouth daily. Patient taking differently: Take 20 mg by mouth daily as needed.  11/29/17   Theora Gianotti, NP  gabapentin (NEURONTIN) 300 MG capsule Take 300 mg by mouth as needed.    [provider]  latanoprost (XALATAN) 0.005 % ophthalmic solution Place 1 drop into both eyes at bedtime.  07/15/14   [provider]  LORazepam (ATIVAN) 0.5 MG tablet Take 0.5 mg by mouth every 8 (eight) hours as needed for anxiety.     [provider]  losartan (COZAAR) 25 MG tablet TAKE 1/2 TABLET(12.5 MG) BY MOUTH DAILY AFTER BREAKFAST 06/11/19   Wellington Hampshire, MD  oxybutynin (DITROPAN-XL) 10 MG 24 hr tablet Take 1 tablet (10 mg total) by mouth daily. Patient not taking: Reported on 02/07/2019 10/10/16   Hollice Espy, MD  pantoprazole (PROTONIX) 40 MG tablet TAKE 1 TABLET(40 MG) BY MOUTH TWICE DAILY *NEEDS OFFICE VISIT FOR FURTHER REFILLS* 07/14/19   Wellington Hampshire, MD  rosuvastatin (CRESTOR) 10 MG tablet TAKE 1 TABLET(10 MG) BY MOUTH EVERY OTHER DAY *NEEDS OFFICE VISIT FOR FURTHER REFILLS-PLEASE CALL [phone] TO SCHEDULE.* 06/26/19   Wellington Hampshire, MD  timolol (TIMOPTIC) 0.5 % ophthalmic solution Place 1 drop into both eyes 2 (two) times daily.  06/15/14   [provider]    Review of Systems    Dyspnea on exertion  after walking about 50 yards.  This is progressed over the past 3 months.  She has some degree of chronic fatigue.  She denies chest pain, palpitations, PND, orthopnea, dizziness, syncope, edema, or early satiety.  All other systems reviewed and are otherwise negative except as noted above.  Physical Exam    VS:  BP 120/60 (BP Location: Left Arm, Patient Position: Sitting, Cuff Size: Normal)   Pulse 79   Temp (!) 96.5 F (35.8 C)   Ht 5\' 1"  (1.549 m)   Wt 103 lb (46.7 kg)   BMI 19.46 kg/m  , BMI Body mass index is 19.46 kg/m. GEN: Well nourished, well developed, in no acute distress. HEENT: normal. Neck: Supple, no JVD, carotid bruits, or masses. Cardiac: RRR, 2 of 6 stock ejection murmur loudest at the upper sternal borders, no rubs, or gallops. No clubbing, cyanosis, edema.  Radials/PT 2+ and equal bilaterally.  Respiratory:  Respirations regular and unlabored, clear to auscultation bilaterally. GI: Soft, nontender, nondistended, BS + x 4. MS: no deformity or atrophy. Skin: warm and dry, no rash. Neuro:  Strength and sensation are intact. Psych: Normal affect.  Accessory Clinical Findings    ECG personally reviewed by me today -regular sinus rhythm, 79, left axis deviation, prior anterior and inferior infarcts- no acute changes.  Lab Results  Component Value Date   WBC 9.1 02/07/2019   HGB 12.0 02/07/2019   HCT 34.8 (L) 02/07/2019   MCV 93.5 02/07/2019   PLT 138 (L) 02/07/2019   Lab Results  Component Value Date   CREATININE 0.90 02/07/2019   BUN 22 02/07/2019   NA 136 02/07/2019   K 4.3 02/07/2019   CL 102 02/07/2019   CO2 24 02/07/2019   Lab Results  Component Value Date   ALT 11 02/07/2019   AST 14 (L) 02/07/2019   ALKPHOS 50 02/07/2019   BILITOT 0.9 02/07/2019   Lipids from August 2020: Total cholesterol 134, triglycerides 106, HDL 42.4, LDL 70.  Assessment & Plan    1.  Ischemic cardiomyopathy/HFrEF: Following MI, EF was 25 to 35% in December 2013 with  subsequent improvement to 40-45% by most recent echo in 2018.  Patient had been doing well in the setting of prior cardiac rehabilitation but since the onset of COVID-19, activity has dropped significantly and with this, she has noticed dyspnea on exertion over the past 3 months which has progressed to the point of significant fatigue and dyspnea  at about 50 yards of walking.  She has not had any chest pain.  She is euvolemic on examination.  I will arrange for follow-up echocardiogram to reevaluate LV function and based on EF, determine whether or not further ischemic evaluation may be necessary.  She remains on beta-blocker, as needed Lasix, and ARB therapy.  2.  Coronary artery disease: She has not been having chest pain but as noted, has been experiencing progressive dyspnea exertion over the past 3 months.  Follow-up echo as above.  If EF worse, we will need to consider ischemic evaluation.  She remains on aspirin, beta-blocker, ARB, and statin therapy.  3.  Essential hypertension: Stable.  4.  Hyperlipidemia: Recent lipids by primary care in August showed an LDL of 70.  LFTs in June were normal.  She remains on statin therapy.  5.  Non-Hodgkin's lymphoma: Followed closely by oncology.  She thinks this is causing some of her fatigue.  6.  Disposition: Follow-up echocardiogram.  Follow-up in clinic in 1 month or sooner if necessary.   Murray Hodgkins, NP 07/23/2019, 5:00 PM

## 2019-08-06 ENCOUNTER — Telehealth: Payer: Self-pay | Admitting: *Deleted

## 2019-08-06 NOTE — Telephone Encounter (Signed)
Patient is requesting information if she is eligible for a covid-19 vaccine given her risk factors. She states that she had over all body aches and flu like symptoms with her pneumovac and influenza vaccine in the past. She didn't know given her current health conditions (h/o lymphoma and cardiac health) if she should weight the risk the side effects of the vaccination.   Given pt's risk factors for covid, Patient advised/educated to use standard precautions-hand washing and masking and distance.  Pt is requesting a return phone call - [phone]

## 2019-08-07 NOTE — Telephone Encounter (Signed)
Pls advise on pt's covid vaccine eligibility given her current health conditions and lymphoma history.

## 2019-08-08 ENCOUNTER — Other Ambulatory Visit: Payer: Medicare Other

## 2019-08-08 ENCOUNTER — Ambulatory Visit: Payer: Medicare Other | Admitting: Internal Medicine

## 2019-08-08 ENCOUNTER — Telehealth: Payer: Self-pay | Admitting: Internal Medicine

## 2019-08-08 NOTE — Telephone Encounter (Signed)
Spoke to patient regarding Covid vaccine; if available I would recommend the vaccine.  History of lymphoma/age-high risk factors.

## 2019-08-18 ENCOUNTER — Telehealth: Payer: Self-pay | Admitting: *Deleted

## 2019-08-18 ENCOUNTER — Other Ambulatory Visit: Payer: Self-pay

## 2019-08-18 MED ORDER — LOSARTAN POTASSIUM 25 MG PO TABS: ORAL_TABLET | ORAL | 0 refills | Status: DC

## 2019-08-18 MED ORDER — LOSARTAN POTASSIUM 25 MG PO TABS: ORAL_TABLET | ORAL | 4 refills | Status: DC

## 2019-08-18 NOTE — Telephone Encounter (Signed)
Left message on Beverly Milch' voice mail regarding checking the distribution information from the Health Dept

## 2019-08-18 NOTE — Telephone Encounter (Addendum)
The cancer center currently does NOT have the covid vaccine. My advice is for the patient to pay attention to the community distribution information provided by the local health department and stayed tune for any distribution information.

## 2019-08-18 NOTE — Telephone Encounter (Signed)
Son called and is asking how to get her the vaccine recommended for her to get. The COVID vaccine. Dr Doy Hutching told them that they do not have it and do not think they will get it any time in the near future. Please advise

## 2019-08-20 ENCOUNTER — Ambulatory Visit (INDEPENDENT_AMBULATORY_CARE_PROVIDER_SITE_OTHER): Payer: Medicare Other

## 2019-08-20 ENCOUNTER — Other Ambulatory Visit: Payer: Self-pay

## 2019-08-20 DIAGNOSIS — R0600 Dyspnea, unspecified: Secondary | ICD-10-CM | POA: Diagnosis not present

## 2019-08-20 MED ORDER — PERFLUTREN LIPID MICROSPHERE
1.0000 mL | INTRAVENOUS | Status: AC | PRN
  Administered 2019-08-20: 2 mL via INTRAVENOUS

## 2019-08-25 ENCOUNTER — Telehealth: Payer: Self-pay

## 2019-08-25 DIAGNOSIS — R0602 Shortness of breath: Secondary | ICD-10-CM

## 2019-08-25 MED ORDER — FUROSEMIDE 20 MG PO TABS: 20.0000 mg | ORAL_TABLET | Freq: Every day | ORAL | 6 refills | Status: DC | PRN

## 2019-08-25 NOTE — Telephone Encounter (Signed)
Call to patient to discuss echo results. Order placed for myoview. Routing to scheduling for appt. Verbally reviewed instructions. Written instructions mailed.   Advised pt to call for any further questions or concerns.   [place]  Your caregiver has ordered a Stress Test with nuclear imaging. The purpose of this test is to evaluate the blood supply to your heart muscle. This procedure is referred to as a "Non-Invasive Stress Test." This is because other than having an IV started in your vein, nothing is inserted or "invades" your body. Cardiac stress tests are done to find areas of poor blood flow to the heart by determining the extent of coronary artery disease (CAD). Some patients exercise on a treadmill, which naturally increases the blood flow to your heart, while others who are  unable to walk on a treadmill due to physical limitations have a pharmacologic/chemical stress agent called Lexiscan . This medicine will mimic walking on a treadmill by temporarily increasing your coronary blood flow.   Please note: these test may take anywhere between 2-4 hours to complete  PLEASE REPORT TO [place] AT THE FIRST DESK WILL DIRECT YOU WHERE TO GO  Date of Procedure:_____________________________________  Arrival Time for Procedure:______________________________  Instructions regarding medication:     ___x_:  Hold betablocker(s) night before procedure and morning of procedure (coreg)  __x__:  Hold other medications as follows:__________Lasix________________  PLEASE NOTIFY THE OFFICE AT LEAST 24 HOURS IN ADVANCE IF YOU ARE UNABLE TO [place].  [phone] AND  PLEASE NOTIFY NUCLEAR MEDICINE AT [facility] AT LEAST 24 HOURS IN ADVANCE IF YOU ARE UNABLE TO KEEP YOUR APPOINTMENT. [phone]  How to prepare for your Myoview test:  1. Do not eat or drink after midnight 2. No caffeine for 24 hours prior to test 3. No smoking 24 hours prior to  test. 4. Your medication may be taken with water.  If your doctor stopped a medication because of this test, do not take that medication. 5. Ladies, please do not wear dresses.  Skirts or pants are appropriate. Please wear a short sleeve shirt. 6. No perfume, cologne or lotion. 7. Wear comfortable walking shoes. No heels!

## 2019-08-25 NOTE — Telephone Encounter (Signed)
-----   Message from Theora Gianotti, NP sent at 08/21/2019 12:04 PM EST ----- Heart squeezing moderately reduced at 40-45% (normal 55-60%).  This is unchanged since August 2018.  No significant valvular disease.  Given unchanged heart squeezing function with recent onset of progressive shortness of breath with activity, please arrange for a Lexiscan Myoview to rule out ischemia.

## 2019-08-25 NOTE — Addendum Note (Signed)
Addended by: Verlon Au on: 08/25/2019 10:55 AM   Modules accepted: Orders

## 2019-08-28 ENCOUNTER — Other Ambulatory Visit: Payer: Self-pay

## 2019-08-29 ENCOUNTER — Inpatient Hospital Stay (HOSPITAL_BASED_OUTPATIENT_CLINIC_OR_DEPARTMENT_OTHER): Payer: Medicare Other | Admitting: Internal Medicine

## 2019-08-29 ENCOUNTER — Inpatient Hospital Stay: Payer: Medicare Other | Attending: Internal Medicine

## 2019-08-29 ENCOUNTER — Other Ambulatory Visit: Payer: Self-pay

## 2019-08-29 ENCOUNTER — Encounter: Payer: Self-pay | Admitting: Internal Medicine

## 2019-08-29 DIAGNOSIS — R630 Anorexia: Secondary | ICD-10-CM | POA: Diagnosis not present

## 2019-08-29 DIAGNOSIS — I509 Heart failure, unspecified: Secondary | ICD-10-CM | POA: Insufficient documentation

## 2019-08-29 DIAGNOSIS — R591 Generalized enlarged lymph nodes: Secondary | ICD-10-CM | POA: Insufficient documentation

## 2019-08-29 DIAGNOSIS — I252 Old myocardial infarction: Secondary | ICD-10-CM | POA: Insufficient documentation

## 2019-08-29 DIAGNOSIS — Z87891 Personal history of nicotine dependence: Secondary | ICD-10-CM | POA: Diagnosis not present

## 2019-08-29 DIAGNOSIS — R5383 Other fatigue: Secondary | ICD-10-CM | POA: Diagnosis not present

## 2019-08-29 DIAGNOSIS — M1990 Unspecified osteoarthritis, unspecified site: Secondary | ICD-10-CM | POA: Insufficient documentation

## 2019-08-29 DIAGNOSIS — C8384 Other non-follicular lymphoma, lymph nodes of axilla and upper limb: Secondary | ICD-10-CM

## 2019-08-29 DIAGNOSIS — Z8719 Personal history of other diseases of the digestive system: Secondary | ICD-10-CM | POA: Diagnosis not present

## 2019-08-29 DIAGNOSIS — R1310 Dysphagia, unspecified: Secondary | ICD-10-CM | POA: Insufficient documentation

## 2019-08-29 DIAGNOSIS — C8284 Other types of follicular lymphoma, lymph nodes of axilla and upper limb: Secondary | ICD-10-CM | POA: Insufficient documentation

## 2019-08-29 DIAGNOSIS — D696 Thrombocytopenia, unspecified: Secondary | ICD-10-CM | POA: Insufficient documentation

## 2019-08-29 DIAGNOSIS — Z90722 Acquired absence of ovaries, bilateral: Secondary | ICD-10-CM | POA: Insufficient documentation

## 2019-08-29 DIAGNOSIS — R634 Abnormal weight loss: Secondary | ICD-10-CM | POA: Diagnosis not present

## 2019-08-29 DIAGNOSIS — I2510 Atherosclerotic heart disease of native coronary artery without angina pectoris: Secondary | ICD-10-CM | POA: Insufficient documentation

## 2019-08-29 DIAGNOSIS — Z888 Allergy status to other drugs, medicaments and biological substances status: Secondary | ICD-10-CM | POA: Diagnosis not present

## 2019-08-29 DIAGNOSIS — E785 Hyperlipidemia, unspecified: Secondary | ICD-10-CM | POA: Diagnosis not present

## 2019-08-29 DIAGNOSIS — Z8249 Family history of ischemic heart disease and other diseases of the circulatory system: Secondary | ICD-10-CM | POA: Diagnosis not present

## 2019-08-29 DIAGNOSIS — C8304 Small cell B-cell lymphoma, lymph nodes of axilla and upper limb: Secondary | ICD-10-CM

## 2019-08-29 DIAGNOSIS — Z79899 Other long term (current) drug therapy: Secondary | ICD-10-CM | POA: Insufficient documentation

## 2019-08-29 DIAGNOSIS — Z885 Allergy status to narcotic agent status: Secondary | ICD-10-CM | POA: Diagnosis not present

## 2019-08-29 LAB — CBC WITH DIFFERENTIAL/PLATELET
Abs Immature Granulocytes: 0.01 10*3/uL (ref 0.00–0.07)
Basophils Absolute: 0 10*3/uL (ref 0.0–0.1)
Basophils Relative: 0 %
Eosinophils Absolute: 0.2 10*3/uL (ref 0.0–0.5)
Eosinophils Relative: 2 %
HCT: 37 % (ref 36.0–46.0)
Hemoglobin: 12.1 g/dL (ref 12.0–15.0)
Immature Granulocytes: 0 %
Lymphocytes Relative: 58 %
Lymphs Abs: 5.3 10*3/uL — ABNORMAL HIGH (ref 0.7–4.0)
MCH: 31.9 pg (ref 26.0–34.0)
MCHC: 32.7 g/dL (ref 30.0–36.0)
MCV: 97.6 fL (ref 80.0–100.0)
Monocytes Absolute: 0.6 10*3/uL (ref 0.1–1.0)
Monocytes Relative: 7 %
Neutro Abs: 3 10*3/uL (ref 1.7–7.7)
Neutrophils Relative %: 33 %
Platelets: 133 10*3/uL — ABNORMAL LOW (ref 150–400)
RBC: 3.79 MIL/uL — ABNORMAL LOW (ref 3.87–5.11)
RDW: 13.2 % (ref 11.5–15.5)
WBC: 9.1 10*3/uL (ref 4.0–10.5)
nRBC: 0 % (ref 0.0–0.2)

## 2019-08-29 LAB — COMPREHENSIVE METABOLIC PANEL
ALT: 10 U/L (ref 0–44)
AST: 14 U/L — ABNORMAL LOW (ref 15–41)
Albumin: 4.2 g/dL (ref 3.5–5.0)
Alkaline Phosphatase: 51 U/L (ref 38–126)
Anion gap: 8 (ref 5–15)
BUN: 30 mg/dL — ABNORMAL HIGH (ref 8–23)
CO2: 27 mmol/L (ref 22–32)
Calcium: 9.6 mg/dL (ref 8.9–10.3)
Chloride: 103 mmol/L (ref 98–111)
Creatinine, Ser: 0.92 mg/dL (ref 0.44–1.00)
GFR calc Af Amer: >60 mL/min (ref >60)
GFR calc non Af Amer: 58 mL/min — ABNORMAL LOW (ref >60)
Glucose, Bld: 90 mg/dL (ref 70–99)
Potassium: 4.4 mmol/L (ref 3.5–5.1)
Sodium: 138 mmol/L (ref 135–145)
Total Bilirubin: 0.9 mg/dL (ref 0.3–1.2)
Total Protein: 7.4 g/dL (ref 6.5–8.1)

## 2019-08-29 LAB — LACTATE DEHYDROGENASE: LDH: 106 U/L (ref 98–192)

## 2019-08-29 NOTE — Progress Notes (Signed)
[place] OFFICE PROGRESS NOTE  Patient Care Team: [name], MD as PCP - General (Unknown Physician Specialty) Wellington Hampshire, MD as PCP - Cardiology (Cardiology) Byrnett, Forest Gleason, MD (General Surgery) Forest Gleason, MD (Inactive) (Oncology) Wellington Hampshire, MD as Consulting Physician (Cardiology)  Cancer Staging No matching staging information was found for the patient.    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;  Rituxan once a week started in January of 2013  # July 2016-RECURRENCE- LYMPH NODE, LEFT AXILLA; EXCISIONAL BIOPSY:  - LOW-GRADE B-CELL LYMPHOMA, IMMUNOPHENOTYPICALLY MOST CONSISTENT WITH CLL/SLL, PET April 2018- STABLE/slight progression  # acute MI in December of 2014; #  upper and lower endoscopy done in May of 2015 ---------------------------------------------------   DIAGNOSIS: Follicular/MARGINAL ZONE LYMPHOMA/ SLL   STAGE:   IV   ;GOALS: control  CURRENT/MOST RECENT THERAPY : surveillance     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  female pleasant patient above history of recurrent low-grade lymphoma/B cell non-Hodgkin type is here for follow-up.  Status post EGD-recommended further evaluation at [facility].  Patient declined.  Patient continues to lose weight slowly.  Complains of poor appetite.  Complains of fatigue.  No night sweats.  No new lumps or bumps.  Review of Systems  Constitutional: Positive for malaise/fatigue and weight loss. Negative for chills, diaphoresis  and fever.  HENT: Negative for nosebleeds and sore throat.   Eyes: Negative for double vision.  Respiratory: Negative for cough, hemoptysis, sputum production, shortness of breath and wheezing.   Cardiovascular: Negative for chest pain, palpitations, orthopnea and leg swelling.  Gastrointestinal: Negative for abdominal pain, blood in stool, constipation, diarrhea, heartburn, melena, nausea and vomiting.  Genitourinary: Negative for dysuria, frequency and urgency.  Musculoskeletal: Positive for back pain and joint pain.  Skin: Negative.  Negative for itching and rash.  Neurological: Negative for dizziness, tingling, focal weakness, weakness and headaches.  Endo/Heme/Allergies: Does not bruise/bleed easily.  Psychiatric/Behavioral: Negative for depression. The patient is not nervous/anxious and does not have insomnia.     PAST MEDICAL HISTORY :  Past Medical History:  Diagnosis Date  . A-fib ([place])    07/2012: A. fib with RVR in the setting of myocardial infarction. Converted to sinus rhythm with amiodarone. No recurrence.  . Anxiety   . Atrophic vaginitis   . CHF (congestive heart failure) ([place])   . Chronic combined systolic (congestive) and diastolic (congestive) heart failure ([place])    a. EF was 25-35% post MI but improved to 45-50% in 04/2013; b. 03/2017 Echo: EF 40-45%, mod focal/basal hypertrophy of septum. Sev mid-apicalanteroseptal, ant, and apical HK. Gr1 DD, mild AI/MR, mod TR, PASP 72mHg.  . Colon adenomas   . Coronary artery disease    a. [id number] STEMI complicated by cardiogenic shock. Cath/PCI: LAD 128m (DESx2), RCA 95p(DES). EF 25%; b. 03/2017 MV: EF 57%, fixed apical, periapical, mid to distal anteroseptal defect. No ischemia.  . Eczema   . Fibrocystic breast disease   . Gastritis   . GERD (gastroesophageal reflux disease)   . Glaucoma   .  Headache   . History of gastritis   . Hyperlipidemia   . Hypertension   . Hypothyroidism   . Insomnia   . Ischemic cardiomyopathy     a. 07/2012 EF 25-35% following MI-->Improved to 45-50%;  b. 03/2017 Echo: EF 40-45%.  [name] Lymphoma ([place])   . Myocardial infarction ([place]) 08/18/2014  . Non Hodgkin's lymphoma ([place]) 2005   reoccurance 2007 and 2012  . Osteoarthritis   . Osteoporosis   . Polyneuropathy   . Polyposis of colon   . Restless leg syndrome   . Urinary, incontinence, stress female     PAST SURGICAL HISTORY :   Past Surgical History:  Procedure Laterality Date  . ABDOMINAL HYSTERECTOMY  1956  . APPENDECTOMY    . AXILLARY LYMPH NODE BIOPSY Left 03/18/2015   Procedure: AXILLARY LYMPH NODE BIOPSY;  Surgeon: [name], MD;  Location: [name];  Service: General;  Laterality: Left;  . CARDIAC CATHETERIZATION  08/19/2012   [name]; [place]  . COLONOSCOPY    . COLONOSCOPY WITH PROPOFOL N/A 03/02/2016   Procedure: COLONOSCOPY WITH PROPOFOL;  Surgeon: Manya Silvas, MD;  Location: [facility] ENDOSCOPY;  Service: Endoscopy;  Laterality: N/A;  . COLONOSCOPY WITH PROPOFOL N/A 09/26/2017   Procedure: COLONOSCOPY WITH PROPOFOL;  Surgeon: Manya Silvas, MD;  Location: [facility] ENDOSCOPY;  Service: Endoscopy;  Laterality: N/A;  . CORONARY ANGIOPLASTY WITH STENT PLACEMENT     x3 stents  . CORONARY ANGIOPLASTY WITH STENT PLACEMENT    . CORONARY ARTERY BYPASS GRAFT    . ESOPHAGOGASTRODUODENOSCOPY (EGD) WITH PROPOFOL N/A 03/02/2016   Procedure: ESOPHAGOGASTRODUODENOSCOPY (EGD) WITH PROPOFOL;  Surgeon: Manya Silvas, MD;  Location: [facility] ENDOSCOPY;  Service: Endoscopy;  Laterality: N/A;  . ESOPHAGOGASTRODUODENOSCOPY (EGD) WITH PROPOFOL N/A 04/14/2019   Procedure: ESOPHAGOGASTRODUODENOSCOPY (EGD) WITH PROPOFOL;  Surgeon: Toledo, Benay Pike, MD;  Location: [name] ENDOSCOPY;  Service: Gastroenterology;  Laterality: N/A;  . LYMPH NODE BIOPSY Right 07-24-11   Dr Bary Castilla  . MOHS SURGERY     bilateral shoulders  . PTCA    . skin cancer removal    . TOTAL ABDOMINAL HYSTERECTOMY W/ BILATERAL SALPINGOOPHORECTOMY      FAMILY HISTORY :    Family History  Problem Relation Age of Onset  . Heart attack Father   . Heart disease Brother   . Leukemia Mother   . Bladder Cancer Neg Hx   . Prostate cancer Neg Hx   . Kidney cancer Neg Hx   . Breast cancer Neg Hx     SOCIAL HISTORY:   Social History   Tobacco Use  . Smoking status: Former Smoker    Types: Cigarettes    Quit date: 1950    Years since quitting: 71.0  . Smokeless tobacco: Never Used  Substance Use Topics  . Alcohol use: No    Alcohol/week: 0.0 standard drinks  . Drug use: No    ALLERGIES:  is allergic to ace inhibitors; benadryl [diphenhydramine hcl]; codeine; diphenhydramine; guaiacol; hydrocodone; and guaifenesin & derivatives.  MEDICATIONS:  Current Outpatient Medications  Medication Sig Dispense Refill  . acetaminophen (TYLENOL) 650 MG CR tablet Take 650 mg by mouth every 8 (eight) hours as needed for pain.    [name] aspirin 81 MG tablet Take 81 mg by mouth every morning.     . carvedilol (COREG) 3.125 MG tablet TAKE 1 TABLET BY MOUTH TWICE DAILY WITH A MEAL 60 tablet 3  . Cholecalciferol (VITAMIN D-3) 5000 UNITS TABS Take 2,000 Int'l Units by mouth every morning.     [name]  fexofenadine (ALLEGRA) 180 MG tablet Take 180 mg by mouth daily as needed for rhinitis.     . furosemide (LASIX) 20 MG tablet Take 1 tablet (20 mg total) by mouth daily as needed. 30 tablet 6  . gabapentin (NEURONTIN) 300 MG capsule Take 300 mg by mouth as needed.    . latanoprost (XALATAN) 0.005 % ophthalmic solution Place 1 drop into both eyes at bedtime.     [name] LORazepam (ATIVAN) 0.5 MG tablet Take 0.5 mg by mouth every 8 (eight) hours as needed for anxiety.     [name] losartan (COZAAR) 25 MG tablet TAKE 1/2 TABLET(12.5 MG) BY MOUTH DAILY AFTER BREAKFAST 45 tablet 0  . pantoprazole (PROTONIX) 40 MG tablet TAKE 1 TABLET(40 MG) BY MOUTH TWICE DAILY 60 tablet 1  . rosuvastatin (CRESTOR) 10 MG tablet TAKE 1 TABLET(10 MG) BY MOUTH EVERY OTHER DAY 45 tablet 3  . timolol (TIMOPTIC) 0.5 % ophthalmic  solution Place 1 drop into both eyes 2 (two) times daily.      No current facility-administered medications for this visit.    PHYSICAL EXAMINATION: ECOG PERFORMANCE STATUS: 0 - Asymptomatic  BP (!) 147/76 (BP Location: Left Arm, Patient Position: Sitting, Cuff Size: Normal)   Pulse 69   Temp 97.9 F (36.6 C) (Tympanic)   Wt 101 lb 8 oz (46 kg)   BMI 19.18 kg/m   Filed Weights   08/29/19 1312  Weight: 101 lb 8 oz (46 kg)    Physical Exam  Constitutional: She is oriented to person, place, and time and well-developed, well-nourished, and in no distress.  She is accompanied by her son.  HENT:  Head: Normocephalic and atraumatic.  Mouth/Throat: Oropharynx is clear and moist. No oropharyngeal exudate.  Eyes: Pupils are equal, round, and reactive to light.  Cardiovascular: Normal rate and regular rhythm.  Pulmonary/Chest: No respiratory distress. She has no wheezes.  Abdominal: Soft. Bowel sounds are normal. She exhibits no distension and no mass. There is no abdominal tenderness. There is no rebound and no guarding.  Musculoskeletal:        General: No tenderness or edema. Normal range of motion.     Cervical back: Normal range of motion and neck supple.  Lymphadenopathy:  Bilateral axillary adenopathy 1-2 cm in size.   Neurological: She is alert and oriented to person, place, and time.  Skin: Skin is warm.  Psychiatric: Affect normal.     LABORATORY DATA:  I have reviewed the data as listed    Component Value Date/Time   NA 138 08/29/2019 1254   NA 141 09/07/2014 1449   K 4.4 08/29/2019 1254   K 4.0 09/07/2014 1449   CL 103 08/29/2019 1254   CL 106 09/07/2014 1449   CO2 27 08/29/2019 1254   CO2 29 09/07/2014 1449   GLUCOSE 90 08/29/2019 1254   GLUCOSE 109 (H) 09/07/2014 1449   BUN 30 (H) 08/29/2019 1254   BUN 19 (H) 09/07/2014 1449   CREATININE 0.92 08/29/2019 1254   CREATININE 1.01 09/07/2014 1449   CALCIUM 9.6 08/29/2019 1254   CALCIUM 8.8 09/07/2014 1449    PROT 7.4 08/29/2019 1254   PROT 6.3 09/02/2015 0824   PROT 6.5 09/07/2014 1449   ALBUMIN 4.2 08/29/2019 1254   ALBUMIN 4.2 09/02/2015 0824   ALBUMIN 3.8 09/07/2014 1449   AST 14 (L) 08/29/2019 1254   AST 11 (L) 09/07/2014 1449   ALT 10 08/29/2019 1254   ALT 22 09/07/2014 1449   ALKPHOS 51 08/29/2019 1254  ALKPHOS 75 09/07/2014 1449   BILITOT 0.9 08/29/2019 1254   BILITOT 0.4 09/02/2015 0824   BILITOT 0.8 09/07/2014 1449   GFRNONAA 58 (L) 08/29/2019 1254   GFRNONAA 56 (L) 09/07/2014 1449   GFRNONAA 44 (L) 02/09/2014 1351   GFRAA >60 08/29/2019 1254   GFRAA >60 09/07/2014 1449   GFRAA 51 (L) 02/09/2014 1351    No results found for: SPEP, UPEP  Lab Results  Component Value Date   WBC 9.1 08/29/2019   NEUTROABS 3.0 08/29/2019   HGB 12.1 08/29/2019   HCT 37.0 08/29/2019   MCV 97.6 08/29/2019   PLT 133 (L) 08/29/2019      Chemistry      Component Value Date/Time   NA 138 08/29/2019 1254   NA 141 09/07/2014 1449   K 4.4 08/29/2019 1254   K 4.0 09/07/2014 1449   CL 103 08/29/2019 1254   CL 106 09/07/2014 1449   CO2 27 08/29/2019 1254   CO2 29 09/07/2014 1449   BUN 30 (H) 08/29/2019 1254   BUN 19 (H) 09/07/2014 1449   CREATININE 0.92 08/29/2019 1254   CREATININE 1.01 09/07/2014 1449      Component Value Date/Time   CALCIUM 9.6 08/29/2019 1254   CALCIUM 8.8 09/07/2014 1449   ALKPHOS 51 08/29/2019 1254   ALKPHOS 75 09/07/2014 1449   AST 14 (L) 08/29/2019 1254   AST 11 (L) 09/07/2014 1449   ALT 10 08/29/2019 1254   ALT 22 09/07/2014 1449   BILITOT 0.9 08/29/2019 1254   BILITOT 0.4 09/02/2015 0824   BILITOT 0.8 09/07/2014 1449      RADIOGRAPHIC STUDIES: I have personally reviewed the radiological images as listed and agreed with the findings in the report. No results found.   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 Status post  multiple lines of therapy in the past.  Stable  # June 16th PET 2020-  small lymphadenopathy above and below diaphragm.  Low FDG activity-STABLE.  Will repeat PET scan in 6 months.  # Again reviewed the treatment options for the patient including but not limited to-R-CVP; Gazyva based therapy; lenalidomide rituximab etc. Monitor for now.   # mild weight loss/poor appetite/?  Dysphagia- s/p EGD.  Declines further evaluation at [facility].  discussed referral to dietitian again declines.   # Mild thrombocytopenia- 133; STABLE.  monitor for now.    # Fatigue-chronic unclear etiology.  Stable.  #  # I discussed regarding Covid precautions/and also discussed proceeding with Covid vaccination when available.  Discussed that unfortunately the data safety and efficacy of vaccination is unclear especially in patients with immunocompromised state.  However, I think the benefits of the vaccination outweigh the potential risks. Pt s/p 1 injection on 08/27/2018.   # DISPOSITION:  # Follow-up in approximately 6 months-MD with labs-cbc/cm/ldh;PET scan- Dr.B      Orders Placed This Encounter  Procedures  . NM PET Image Restag (PS) Skull Base To Thigh    Standing Status:   Future    Standing Expiration Date:   08/28/2020    Order Specific Question:   ** REASON FOR EXAM (FREE TEXT)    Answer:   lymphoma    Order Specific Question:   If indicated for the ordered procedure, I authorize the administration of a radiopharmaceutical per Radiology protocol    Answer:   Yes    Order Specific Question:   Radiology  Contrast Protocol - do NOT remove file path    Answer:   [email or web address]  . CBC with Differential    Standing Status:   Future    Standing Expiration Date:   08/28/2020  . Comprehensive metabolic panel    Standing Status:   Future    Standing Expiration Date:   08/28/2020  . Lactate dehydrogenase    Standing Status:   Future    Standing Expiration Date:   08/28/2020   All  questions were answered. The patient knows to call the clinic with any problems, questions or concerns.      Cammie Sickle, MD 09/01/2019 8:08 AM

## 2019-08-29 NOTE — Assessment & Plan Note (Addendum)
#   LOW Grade lymphoma- B-cell non-Hodgkin's lymphoma [follicle lymphoma/marginal zone lymphoma/SLL]. Status post multiple lines of therapy in the past.  Stable  # June 16th PET 2020-  small lymphadenopathy above and below diaphragm.  Low FDG activity-STABLE.  Will repeat PET scan in 6 months.  # Again reviewed the treatment options for the patient including but not limited to-R-CVP; Gazyva based therapy; lenalidomide rituximab etc. Monitor for now.   # mild weight loss/poor appetite/?  Dysphagia- s/p EGD.  Declines further evaluation at [facility].  discussed referral to dietitian again declines.   # Mild thrombocytopenia- 133; STABLE.  monitor for now.    # Fatigue-chronic unclear etiology.  Stable.  #  # I discussed regarding Covid precautions/and also discussed proceeding with Covid vaccination when available.  Discussed that unfortunately the data safety and efficacy of vaccination is unclear especially in patients with immunocompromised state.  However, I think the benefits of the vaccination outweigh the potential risks. Pt s/p 1 injection on 08/27/2018.   # DISPOSITION:  # Follow-up in approximately 6 months-MD with labs-cbc/cm/ldh;PET scan- Dr.B

## 2019-09-01 ENCOUNTER — Encounter
Admission: RE | Admit: 2019-09-01 | Discharge: 2019-09-01 | Disposition: A | Discharge status: Home / Self Care | Payer: Medicare Other | Source: Ambulatory Visit | Attending: Nurse Practitioner | Admitting: Nurse Practitioner

## 2019-09-01 ENCOUNTER — Other Ambulatory Visit: Payer: Self-pay

## 2019-09-01 DIAGNOSIS — R0602 Shortness of breath: Secondary | ICD-10-CM | POA: Insufficient documentation

## 2019-09-01 LAB — NM MYOCAR MULTI W/SPECT W/WALL MOTION / EF
Estimated workload: 1 METS
Exercise duration (min): 0 min
Exercise duration (sec): 0 s
LV dias vol: 54 mL (ref 46–106)
LV sys vol: 18 mL
MPHR: 138 {beats}/min
Peak HR: 109 {beats}/min
Percent HR: 78 %
Rest HR: 71 {beats}/min
SDS: 9
SRS: 16
SSS: 18
TID: 1.03

## 2019-09-01 MED ORDER — TECHNETIUM TC 99M TETROFOSMIN IV KIT
10.5770 | PACK | Freq: Once | INTRAVENOUS | Status: AC | PRN
  Administered 2019-09-01: 10.577 via INTRAVENOUS

## 2019-09-01 MED ORDER — TECHNETIUM TC 99M TETROFOSMIN IV KIT
30.0000 | PACK | Freq: Once | INTRAVENOUS | Status: AC | PRN
  Administered 2019-09-01: 10:00:00 29.373 via INTRAVENOUS

## 2019-09-01 MED ORDER — REGADENOSON 0.4 MG/5ML IV SOLN
0.4000 mg | Freq: Once | INTRAVENOUS | Status: AC
  Administered 2019-09-01: 0.4 mg via INTRAVENOUS

## 2019-09-02 ENCOUNTER — Telehealth: Payer: Self-pay | Admitting: Nurse Practitioner

## 2019-09-02 MED ORDER — LOSARTAN POTASSIUM 25 MG PO TABS: ORAL_TABLET | ORAL | 3 refills | Status: DC

## 2019-09-02 NOTE — Telephone Encounter (Signed)
Patient calling back to hear results of stress test from 1/11. States she will not be available this afternoon.. Also needs med refills but wants to know if anything will change before she asks for refills  Please advise this morning

## 2019-09-02 NOTE — Telephone Encounter (Signed)
-----   Message from Theora Gianotti, NP sent at 09/02/2019 10:26 AM EST ----- No significant change from stress test in 2018 - good news.  Keep f/u with Dr. Fletcher Anon next week to discuss further and re-eval symptoms.

## 2019-09-02 NOTE — Telephone Encounter (Signed)
Spoke to patient about stress test results. Pt verbalized understanding and had no further questions at this time. No further orders.   Confirmed follow up with Dr. Fletcher Anon on the 19th of Jan.   Advised pt to call for any further questions or concerns.

## 2019-09-04 ENCOUNTER — Ambulatory Visit: Payer: Medicare Other | Attending: Neurology

## 2019-09-04 ENCOUNTER — Other Ambulatory Visit: Payer: Self-pay

## 2019-09-04 DIAGNOSIS — M6281 Muscle weakness (generalized): Secondary | ICD-10-CM | POA: Diagnosis present

## 2019-09-04 DIAGNOSIS — R2689 Other abnormalities of gait and mobility: Secondary | ICD-10-CM

## 2019-09-04 DIAGNOSIS — R2681 Unsteadiness on feet: Secondary | ICD-10-CM | POA: Diagnosis present

## 2019-09-04 NOTE — Therapy (Signed)
[place] MAIN [facility] SERVICES [address] [place], [place], [address] Phone: [phone]   Fax:  [phone]  Physical Therapy Evaluation  Patient Details  Name: Jeanne Pittman MRN: [id number] Date of Birth: 11-05-1934 Referring Provider (PT): Jennings Books   Encounter Date: 09/04/2019  PT End of Session - 09/05/19 1001    Visit Number  1    Number of Visits  16    Date for PT Re-Evaluation  10/30/19    Authorization Type  1/10 eval 09/04/19    PT Start Time  [id number]    PT Stop Time  1345    PT Time Calculation (min)  47 min    Equipment Utilized During Treatment  Gait belt    Activity Tolerance  Patient tolerated treatment well    Behavior During Therapy  WFL for tasks assessed/performed       Past Medical History:  Diagnosis Date  . A-fib ([place])    07/2012: A. fib with RVR in the setting of myocardial infarction. Converted to sinus rhythm with amiodarone. No recurrence.  . Anxiety   . Atrophic vaginitis   . CHF (congestive heart failure) ([place])   . Chronic combined systolic (congestive) and diastolic (congestive) heart failure ([place])    a. EF was 25-35% post MI but improved to 45-50% in 04/2013; b. 03/2017 Echo: EF 40-45%, mod focal/basal hypertrophy of septum. Sev mid-apicalanteroseptal, ant, and apical HK. Gr1 DD, mild AI/MR, mod TR, PASP 52mHg.  . Colon adenomas   . Coronary artery disease    a. [id number] STEMI complicated by cardiogenic shock. Cath/PCI: LAD 186m (DESx2), RCA 95p(DES). EF 25%; b. 03/2017 MV: EF 57%, fixed apical, periapical, mid to distal anteroseptal defect. No ischemia.  . Eczema   . Fibrocystic breast disease   . Gastritis   . GERD (gastroesophageal reflux disease)   . Glaucoma   . Headache   . History of gastritis   . Hyperlipidemia   . Hypertension   . Hypothyroidism   . Insomnia   . Ischemic cardiomyopathy    a. 07/2012 EF 25-35% following MI-->Improved to 45-50%;  b. 03/2017 Echo: EF 40-45%.  [name] Lymphoma ([place])    . Myocardial infarction ([place]) 08/18/2014  . Non Hodgkin's lymphoma ([place]) 2005   reoccurance 2007 and 2012  . Osteoarthritis   . Osteoporosis   . Polyneuropathy   . Polyposis of colon   . Restless leg syndrome   . Urinary, incontinence, stress female     Past Surgical History:  Procedure Laterality Date  . ABDOMINAL HYSTERECTOMY  1956  . APPENDECTOMY    . AXILLARY LYMPH NODE BIOPSY Left 03/18/2015   Procedure: AXILLARY LYMPH NODE BIOPSY;  Surgeon: [name], MD;  Location: [name];  Service: General;  Laterality: Left;  . CARDIAC CATHETERIZATION  08/19/2012   [name]; [place]  . COLONOSCOPY    . COLONOSCOPY WITH PROPOFOL N/A 03/02/2016   Procedure: COLONOSCOPY WITH PROPOFOL;  Surgeon: Manya Silvas, MD;  Location: [facility] ENDOSCOPY;  Service: Endoscopy;  Laterality: N/A;  . COLONOSCOPY WITH PROPOFOL N/A 09/26/2017   Procedure: COLONOSCOPY WITH PROPOFOL;  Surgeon: Manya Silvas, MD;  Location: [facility] ENDOSCOPY;  Service: Endoscopy;  Laterality: N/A;  . CORONARY ANGIOPLASTY WITH STENT PLACEMENT     x3 stents  . CORONARY ANGIOPLASTY WITH STENT PLACEMENT    . CORONARY ARTERY BYPASS GRAFT    . ESOPHAGOGASTRODUODENOSCOPY (EGD) WITH PROPOFOL N/A 03/02/2016   Procedure: ESOPHAGOGASTRODUODENOSCOPY (EGD) WITH PROPOFOL;  Surgeon: Gavin Pound  Vira Agar, MD;  Location: [place] ENDOSCOPY;  Service: Endoscopy;  Laterality: N/A;  . ESOPHAGOGASTRODUODENOSCOPY (EGD) WITH PROPOFOL N/A 04/14/2019   Procedure: ESOPHAGOGASTRODUODENOSCOPY (EGD) WITH PROPOFOL;  Surgeon: [place], Benay Pike, MD;  Location: [name] ENDOSCOPY;  Service: Gastroenterology;  Laterality: N/A;  . LYMPH NODE BIOPSY Right 07-24-11   Dr Bary Castilla  . MOHS SURGERY     bilateral shoulders  . PTCA    . skin cancer removal    . TOTAL ABDOMINAL HYSTERECTOMY W/ BILATERAL SALPINGOOPHORECTOMY      There were no vitals filed for this visit.   Subjective Assessment - 09/04/19 1302    Subjective  Patient is a pleasant 85 year old female who presents  to physical therapy for imbalance.    Pertinent History  Patient is a pleasant 85 year old female who presents to physical therapy for imbalance. Per previous documentation patient has been having unsteadiness for several months and burning in bilateral LE's and feet since 2015 after having a heart attack. PMH includes A fib, MI s/p stent placement, lymphoma, osteoporosis, ASCVD, atherosclerosis, atrophic vaginitis, CHF, HTN, fibrocystic disease of breast, GERD, glaucoma, HLD, colonic polyps, HLD, hypothyroidism, migraine, stress incontinence, polyneuropathy. It has been since October since she saw the physician for right sided weakness. Is having some pain in right hip, buttocks, leg, and foot. Foot is stabbing when first gets up and aches in the back of knee. Usually stumbles around corners.    Limitations  Lifting;Standing;Walking;House hold activities    How long can you sit comfortably?  all day    How long can you stand comfortably?  gets fatigued    How long can you walk comfortably?  2 blocks    Patient Stated Goals  to walk further    Currently in Pain?  No/denies         [facility] PT Assessment - 09/05/19 0001      Assessment   Medical Diagnosis  imbalance    Referring Provider (PT)  Hemang Manuella Ghazi    Onset Date/Surgical Date  --   past few months   Prior Therapy  cardiac rehab       Precautions   Precautions  Fall      Restrictions   Weight Bearing Restrictions  No      Balance Screen   Has the patient fallen in the past 6 months  No    Has the patient had a decrease in activity level because of a fear of falling?   Yes    Is the patient reluctant to leave their home because of a fear of falling?   No      Home Environment   Living Environment  Private residence    Living Arrangements  Alone    Type of [place] Access  Level entry    [place] seat;Grab bars - tub/shower;Grab bars - toilet      Prior Function    Level of Independence  Independent with basic ADLs    Leisure  would like to walk more      Cognition   Overall Cognitive Status  Within Functional Limits for tasks assessed      Standardized Balance Assessment   Standardized Balance Assessment  Berg Balance Test;Dynamic Gait Index      Berg Balance Test   Sit to Stand  Able to stand without using hands and stabilize independently  Standing Unsupported  Able to stand safely 2 minutes    Sitting with Back Unsupported but Feet Supported on Floor or Stool  Able to sit safely and securely 2 minutes    Stand to Sit  Sits safely with minimal use of hands    Transfers  Able to transfer safely, minor use of hands    Standing Unsupported with Eyes Closed  Able to stand 10 seconds with supervision    Standing Unsupported with Feet Together  Able to place feet together independently but unable to hold for 30 seconds    From Standing, Reach Forward with Outstretched Arm  Can reach forward >5 cm safely (2")    From Standing Position, Pick up Object from [place] to pick up shoe safely and easily    From Standing Position, Turn to Look Behind Over each Shoulder  Looks behind one side only/other side shows less weight shift    Turn 360 Degrees  Able to turn 360 degrees safely but slowly    Standing Unsupported, Alternately Place Feet on Step/Stool  Able to stand independently and complete 8 steps >20 seconds    Standing Unsupported, One Foot in Front  Able to take small step independently and hold 30 seconds    Standing on One Leg  Able to lift leg independently and hold equal to or more than 3 seconds    Total Score  43      Dynamic Gait Index   Level Surface  Normal    Change in Gait Speed  Mild Impairment    Gait with Horizontal Head Turns  Moderate Impairment    Gait with Vertical Head Turns  Mild Impairment    Gait and Pivot Turn  Mild Impairment    Step Over Obstacle  Mild Impairment    Step Around Obstacles  Normal    Steps  Moderate  Impairment    Total Score  16            PAIN: Morning pain: 5-6/10  R neck pain limits driving  POSTURE: Seated: standard WFL positioning of trunk and pelvis  Standing: slight weight shift off of RLE onto LLE.    STRENGTH:  Graded on a 0-5 scale Muscle Group Left Right  Hip Flex 4/5 3+/5  Hip Abd 4-/5 3/5  Hip Add 4-/5 3/5  Hip Ext 4-/5 3/5  Hip IR/ER    Knee Flex 4-/5 3+/5  Knee Ext 4-/5 3+/5  Ankle DF 4-/5 3+/5  Ankle PF 4-/5 3+/5    SENSATION: R decreased sensation than L to light touch Bilateral LE neuropathy with R affected more than L  SPECIAL TESTS: Coordination: WFL finger to nose and heel to shin test   FUNCTIONAL MOBILITY: Stair negotiation: ascending ; occasional use of UE for support on railing, reciprocal pattern Stair negotiation: descending: occasional step to pattern with SUE and Double UE support on railing, high fear of LOB noted with limited trunk support, unstable.   BALANCE:  Dynamic Sitting Balance  Normal Able to sit unsupported and weight shift across midline maximally   Good Able to sit unsupported and weight shift across midline moderately   Good-/Fair+ Able to sit unsupported and weight shift across midline minimally x  Fair Minimal weight shifting ipsilateral/front, difficulty crossing midline   Fair- Reach to ipsilateral side and unable to weight shift   Poor + Able to sit unsupported with min A and reach to ipsilateral side, unable to weight shift   Poor Able  to sit unsupported with mod A and reach ipsilateral/front-can't cross midline     Standing Dynamic Balance  Normal Stand independently unsupported, able to weight shift and cross midline maximally   Good Stand independently unsupported, able to weight shift and cross midline moderately   Good-/Fair+ Stand independently unsupported, able to weight shift across midline minimally   Fair Stand independently unsupported, weight shift, and reach ipsilaterally, loss of balance  when crossing midline x  Poor+ Able to stand with Min A and reach ipsilaterally, unable to weight shift   Poor Able to stand with Mod A and minimally reach ipsilaterally, unable to cross midline.     Static Sitting Balance  Normal Able to maintain balance against maximal resistance   Good Able to maintain balance against moderate resistance   Good-/Fair+ Accepts minimal resistance x  Fair Able to sit unsupported without balance loss and without UE support   Poor+ Able to maintain with Minimal assistance from individual or chair   Poor Unable to maintain balance-requires mod/max support from individual or chair     Static Standing Balance  Normal Able to maintain standing balance against maximal resistance   Good Able to maintain standing balance against moderate resistance   Good-/Fair+ Able to maintain standing balance against minimal resistance x  Fair Able to stand unsupported without UE support and without LOB for 1-2 min   Fair- Requires Min A and UE support to maintain standing without loss of balance   Poor+ Requires mod A and UE support to maintain standing without loss of balance   Poor Requires max A and UE support to maintain standing balance without loss      GAIT: Patient ambulates without AD with slight shuffle stepping and limited foot clearance bilaterally, decreased weight acceptance onto RLE noted  OUTCOME MEASURES: TEST Outcome Interpretation      ABC 49.4%   FOTO  57/100 Goal to be at 63/100 at discharge  FOTO adjusted 52/100   Berg Balance Assessment 43/56 <36/56 (100% risk for falls), 37-45 (80% risk for falls); 46-51 (>50% risk for falls); 52-55 (lower risk <25% of falls)  DGI 16/24     Objective measurements completed on examination: See above findings.              PT Education - 09/05/19 1000    Education Details  goals, POC    Person(s) Educated  Patient    Methods  Explanation;Demonstration;Tactile cues;Verbal cues    Comprehension   Verbalized understanding;Returned demonstration;Verbal cues required;Tactile cues required       PT Short Term Goals - 09/05/19 1242      PT SHORT TERM GOAL #1   Title  Patient will be independent in home exercise program to improve strength/mobility for better functional independence with ADLs.    Baseline  1/14: give next session    Time  2    Period  Weeks    Status  New    Target Date  09/19/19        PT Long Term Goals - 09/05/19 1243      PT LONG TERM GOAL #1   Title  Patient will increase Berg Balance score by > 6 points (49/56)  to demonstrate decreased fall risk during functional activities.    Baseline  1/14: 43/56    Time  8    Period  Weeks    Status  New    Target Date  10/30/19      PT LONG TERM GOAL #  2   Title  Patient will increase ABC scale score >80% to demonstrate better functional mobility and better confidence with ADLs    Baseline  1/14/ 49.4%    Time  8    Period  Weeks    Status  New    Target Date  10/30/19      PT LONG TERM GOAL #3   Title  Patient will increase dynamic gait index score to >19/24 as to demonstrate reduced fall risk and improved dynamic gait balance for better safety with community/home ambulation.    Baseline  1/14: 16/24    Time  8    Period  Weeks    Status  New    Target Date  10/30/19      PT LONG TERM GOAL #4   Title  Patient will increase BLE gross strength to 4+/5 as to improve functional strength for independent gait, increased standing tolerance and increased ADL ability.    Baseline  1/14: R 3+/5, L 4-/5    Time  8    Period  Weeks    Status  New    Target Date  10/30/19             Plan - 09/05/19 1239    Clinical Impression Statement  Patient is a pleasant 85 year old female who presents for imbalance. Patient is challenged with stability and strength of RLE with additional limitations in capacity for functional mobility. She scored a 43/56 on the BERG with single limb tasks and prolonged muscle  recruitment tasks challenging her. Her DGI score of 16/24 indicated limited stability with head movements and object negotiation. Patient will benefit from skilled physical therapy to increase strength, stability, and functional mobility for improved quality of life and decreased falls risk.    Personal Factors and Comorbidities  Age;Comorbidity 3+;Past/Current Experience;Social Background;Time since onset of injury/illness/exacerbation    Comorbidities  A fib, MI s/p stent placement, lymphoma, osteoporosis, ASCVD, atherosclerosis, atrophic vaginitis, CHF, HTN, fibrocystic disease of breast, GERD, glaucoma, HLD, colonic polyps, HLD, hypothyroidism, migraine, stress incontinence, polyneuropathy    Examination-Activity Limitations  Bend;Caring for Others;Carry;Continence;Dressing;Stairs;Squat;Sit;Reach Overhead;Locomotion Level;Lift;Stand;Toileting    Examination-Participation Restrictions  Church;Cleaning;Community Activity;Interpersonal Relationship;Laundry;Volunteer;Shop;Meal Prep;Yard Work    [employer]  Evolving/Moderate complexity    Clinical Decision Making  Moderate    Rehab Potential  Good    PT Frequency  2x / week    PT Duration  8 weeks    PT Treatment/Interventions  ADLs/Self Care Home Management;Aquatic Therapy;Biofeedback;Canalith Repostioning;Cryotherapy;Electrical Stimulation;Moist Heat;Traction;Ultrasound;DME Instruction;Gait training;Stair training;Functional mobility training;Therapeutic activities;Therapeutic exercise;Balance training;Neuromuscular re-education;Patient/family education;Manual techniques;Passive range of motion;Dry needling;Energy conservation;Taping;Vestibular    PT Next Visit Plan  give HEP, single limb strength and stability    PT Home Exercise Plan  give next session    Consulted and Agree with Plan of Care  Patient       Patient will benefit from skilled therapeutic intervention in order to improve the following deficits and  impairments:  Abnormal gait, Cardiopulmonary status limiting activity, Decreased activity tolerance, Decreased balance, Decreased endurance, Difficulty walking, Decreased strength, Postural dysfunction, Improper body mechanics  Visit Diagnosis: Unsteadiness on feet  Other abnormalities of gait and mobility  Muscle weakness (generalized)     Problem List Patient Active Problem List   Diagnosis Date Noted  . 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 H/O neoplasm 01/04/2015  . Arterial vascular disease 12/30/2013  . BP (high blood pressure) 12/30/2013  . Adult hypothyroidism 12/30/2013  .  OP (osteoporosis) 12/30/2013  . Avitaminosis D 12/30/2013  . Atrial fibrillation ([place]) 12/30/2013  . HLD (hyperlipidemia) 12/30/2013  . Preoperative cardiovascular examination 04/07/2013  . Hyperlipidemia 04/07/2013  . A-fib ([place])   . Coronary artery disease   . Chronic systolic heart failure ([place])    Janna Arch, PT, DPT   09/05/2019, 12:48 PM  [place] MAIN [facility] SERVICES [address] [place], [place], [address] Phone: [phone]   Fax:  [phone]  Name: Jeanne Pittman MRN: [id number] Date of Birth: August 25, 1934

## 2019-09-08 ENCOUNTER — Ambulatory Visit: Payer: Medicare Other | Admitting: Physical Therapy

## 2019-09-08 ENCOUNTER — Encounter: Payer: Self-pay | Admitting: Physical Therapy

## 2019-09-08 ENCOUNTER — Other Ambulatory Visit: Payer: Self-pay

## 2019-09-08 DIAGNOSIS — M6281 Muscle weakness (generalized): Secondary | ICD-10-CM

## 2019-09-08 DIAGNOSIS — R2681 Unsteadiness on feet: Secondary | ICD-10-CM | POA: Diagnosis not present

## 2019-09-08 DIAGNOSIS — R2689 Other abnormalities of gait and mobility: Secondary | ICD-10-CM

## 2019-09-08 NOTE — Therapy (Signed)
[place] MAIN [facility] SERVICES [address] [place], [place], [address] Phone: [phone]   Fax:  [phone]  Physical Therapy Treatment  Patient Details  Name: JAMIYA KELLAS MRN: [id number] Date of Birth: 01-Sep-1934 Referring Provider (PT): Jennings Books   Encounter Date: 09/08/2019  PT End of Session - 09/08/19 1201    Visit Number  2    Number of Visits  16    Date for PT Re-Evaluation  10/30/19    Authorization Type  1/10 eval 09/04/19    PT Start Time  1200    PT Stop Time  1225    PT Time Calculation (min)  25 min    Equipment Utilized During Treatment  Gait belt    Activity Tolerance  Patient tolerated treatment well    Behavior During Therapy  WFL for tasks assessed/performed       Past Medical History:  Diagnosis Date  . A-fib ([place])    07/2012: A. fib with RVR in the setting of myocardial infarction. Converted to sinus rhythm with amiodarone. No recurrence.  . Anxiety   . Atrophic vaginitis   . CHF (congestive heart failure) ([place])   . Chronic combined systolic (congestive) and diastolic (congestive) heart failure ([place])    a. EF was 25-35% post MI but improved to 45-50% in 04/2013; b. 03/2017 Echo: EF 40-45%, mod focal/basal hypertrophy of septum. Sev mid-apicalanteroseptal, ant, and apical HK. Gr1 DD, mild AI/MR, mod TR, PASP 4mHg.  . Colon adenomas   . Coronary artery disease    a. 99991111 STEMI complicated by cardiogenic shock. Cath/PCI: LAD 141m (DESx2), RCA 95p(DES). EF 25%; b. 03/2017 MV: EF 57%, fixed apical, periapical, mid to distal anteroseptal defect. No ischemia.  . Eczema   . Fibrocystic breast disease   . Gastritis   . GERD (gastroesophageal reflux disease)   . Glaucoma   . Headache   . History of gastritis   . Hyperlipidemia   . Hypertension   . Hypothyroidism   . Insomnia   . Ischemic cardiomyopathy    a. 07/2012 EF 25-35% following MI-->Improved to 45-50%;  b. 03/2017 Echo: EF 40-45%.  [name] Lymphoma ([place])    . Myocardial infarction ([place]) 08/18/2014  . Non Hodgkin's lymphoma ([place]) 2005   reoccurance 2007 and 2012  . Osteoarthritis   . Osteoporosis   . Polyneuropathy   . Polyposis of colon   . Restless leg syndrome   . Urinary, incontinence, stress female     Past Surgical History:  Procedure Laterality Date  . ABDOMINAL HYSTERECTOMY  1956  . APPENDECTOMY    . AXILLARY LYMPH NODE BIOPSY Left 03/18/2015   Procedure: AXILLARY LYMPH NODE BIOPSY;  Surgeon: [name], MD;  Location: [name];  Service: General;  Laterality: Left;  . CARDIAC CATHETERIZATION  08/19/2012   [name]; [place]  . COLONOSCOPY    . COLONOSCOPY WITH PROPOFOL N/A 03/02/2016   Procedure: COLONOSCOPY WITH PROPOFOL;  Surgeon: Manya Silvas, MD;  Location: [facility] ENDOSCOPY;  Service: Endoscopy;  Laterality: N/A;  . COLONOSCOPY WITH PROPOFOL N/A 09/26/2017   Procedure: COLONOSCOPY WITH PROPOFOL;  Surgeon: Manya Silvas, MD;  Location: [facility] ENDOSCOPY;  Service: Endoscopy;  Laterality: N/A;  . CORONARY ANGIOPLASTY WITH STENT PLACEMENT     x3 stents  . CORONARY ANGIOPLASTY WITH STENT PLACEMENT    . CORONARY ARTERY BYPASS GRAFT    . ESOPHAGOGASTRODUODENOSCOPY (EGD) WITH PROPOFOL N/A 03/02/2016   Procedure: ESOPHAGOGASTRODUODENOSCOPY (EGD) WITH PROPOFOL;  Surgeon: Gavin Pound  Vira Agar, MD;  Location: [place] ENDOSCOPY;  Service: Endoscopy;  Laterality: N/A;  . ESOPHAGOGASTRODUODENOSCOPY (EGD) WITH PROPOFOL N/A 04/14/2019   Procedure: ESOPHAGOGASTRODUODENOSCOPY (EGD) WITH PROPOFOL;  Surgeon: Toledo, Benay Pike, MD;  Location: [name] ENDOSCOPY;  Service: Gastroenterology;  Laterality: N/A;  . LYMPH NODE BIOPSY Right 07-24-11   Dr Bary Castilla  . MOHS SURGERY     bilateral shoulders  . PTCA    . skin cancer removal    . TOTAL ABDOMINAL HYSTERECTOMY W/ BILATERAL SALPINGOOPHORECTOMY      There were no vitals filed for this visit.  Subjective Assessment - 09/08/19 1159    Subjective  Patient is a pleasant 85 year old female who presents  to physical therapy for imbalance.She has no pain and no new concerns.    Pertinent History  Patient is a pleasant 85 year old female who presents to physical therapy for imbalance. Per previous documentation patient has been having unsteadiness for several months and burning in bilateral LE's and feet since 2015 after having a heart attack. PMH includes A fib, MI s/p stent placement, lymphoma, osteoporosis, ASCVD, atherosclerosis, atrophic vaginitis, CHF, HTN, fibrocystic disease of breast, GERD, glaucoma, HLD, colonic polyps, HLD, hypothyroidism, migraine, stress incontinence, polyneuropathy. It has been since October since she saw the physician for right sided weakness. Is having some pain in right hip, buttocks, leg, and foot. Foot is stabbing when first gets up and aches in the back of knee. Usually stumbles around corners.    Limitations  Lifting;Standing;Walking;House hold activities    How long can you sit comfortably?  all day    How long can you stand comfortably?  gets fatigued    How long can you walk comfortably?  2 blocks    Patient Stated Goals  to walk further         Treatment:  Neuromuscular training:  standing on blue foam with head turns x 1 min  Standing feet together on blue foam with head turns x 1 min step ups from floor to 6 inch stool x 20 bilateral Step ups from blue foam to 6 inch stool x 20 BLE Leg press 25 lbs x 20 x 2   Pt educated throughout session about proper posture and technique with exercises. Improved exercise technique, movement at target joints, use of target muscles after min to mod verbal, visual, tactile cues. CGA and Min to mod verbal cues used throughout with increased in postural sway and LOB most seen with narrow base of support and while on uneven surfaces. Continues to have balance deficits typical with diagnosis. Patient performs intermediate level exercises without pain behaviors and needs verbal cuing for postural alignment and head  positioning                      PT Education - 09/08/19 1200    Education Details  HEp    Person(s) Educated  Patient    Methods  Explanation    Comprehension  Verbalized understanding;Returned demonstration;Verbal cues required;Need further instruction       PT Short Term Goals - 09/05/19 1242      PT SHORT TERM GOAL #1   Title  Patient will be independent in home exercise program to improve strength/mobility for better functional independence with ADLs.    Baseline  1/14: give next session    Time  2    Period  Weeks    Status  New    Target Date  09/19/19  PT Long Term Goals - 09/05/19 1243      PT LONG TERM GOAL #1   Title  Patient will increase Berg Balance score by > 6 points (49/56)  to demonstrate decreased fall risk during functional activities.    Baseline  1/14: 43/56    Time  8    Period  Weeks    Status  New    Target Date  10/30/19      PT LONG TERM GOAL #2   Title  Patient will increase ABC scale score >80% to demonstrate better functional mobility and better confidence with ADLs    Baseline  1/14/ 49.4%    Time  8    Period  Weeks    Status  New    Target Date  10/30/19      PT LONG TERM GOAL #3   Title  Patient will increase dynamic gait index score to >19/24 as to demonstrate reduced fall risk and improved dynamic gait balance for better safety with community/home ambulation.    Baseline  1/14: 16/24    Time  8    Period  Weeks    Status  New    Target Date  10/30/19      PT LONG TERM GOAL #4   Title  Patient will increase BLE gross strength to 4+/5 as to improve functional strength for independent gait, increased standing tolerance and increased ADL ability.    Baseline  1/14: R 3+/5, L 4-/5    Time  8    Period  Weeks    Status  New    Target Date  10/30/19            Plan - 09/08/19 1201    Clinical Impression Statement  Patient instructed in intermediate  balance exercise.  Patient requires min Vcs for  correct exercise technique including shifting weight over ankles to gain her balance.  . Patient would benefit from additional skilled PT intervention to improve balance/gait safety and reduce fall risk.   Personal Factors and Comorbidities  Age;Comorbidity 3+;Past/Current Experience;Social Background;Time since onset of injury/illness/exacerbation    Comorbidities  A fib, MI s/p stent placement, lymphoma, osteoporosis, ASCVD, atherosclerosis, atrophic vaginitis, CHF, HTN, fibrocystic disease of breast, GERD, glaucoma, HLD, colonic polyps, HLD, hypothyroidism, migraine, stress incontinence, polyneuropathy    Examination-Activity Limitations  Bend;Caring for Others;Carry;Continence;Dressing;Stairs;Squat;Sit;Reach Overhead;Locomotion Level;Lift;Stand;Toileting    Examination-Participation Restrictions  Church;Cleaning;Community Activity;Interpersonal Relationship;Laundry;Volunteer;Shop;Meal Prep;Yard Work    [employer]  Evolving/Moderate complexity    Rehab Potential  Good    PT Frequency  2x / week    PT Duration  8 weeks    PT Treatment/Interventions  ADLs/Self Care Home Management;Aquatic Therapy;Biofeedback;Canalith Repostioning;Cryotherapy;Electrical Stimulation;Moist Heat;Traction;Ultrasound;DME Instruction;Gait training;Stair training;Functional mobility training;Therapeutic activities;Therapeutic exercise;Balance training;Neuromuscular re-education;Patient/family education;Manual techniques;Passive range of motion;Dry needling;Energy conservation;Taping;Vestibular    PT Next Visit Plan  give HEP, single limb strength and stability    PT Home Exercise Plan  give next session    Consulted and Agree with Plan of Care  Patient       Patient will benefit from skilled therapeutic intervention in order to improve the following deficits and impairments:  Abnormal gait, Cardiopulmonary status limiting activity, Decreased activity tolerance, Decreased balance, Decreased endurance,  Difficulty walking, Decreased strength, Postural dysfunction, Improper body mechanics  Visit Diagnosis: Unsteadiness on feet  Other abnormalities of gait and mobility  Muscle weakness (generalized)     Problem List Patient Active Problem List   Diagnosis Date Noted  . 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 H/O neoplasm 01/04/2015  . Arterial vascular disease 12/30/2013  . BP (high blood pressure) 12/30/2013  . Adult hypothyroidism 12/30/2013  . OP (osteoporosis) 12/30/2013  . Avitaminosis D 12/30/2013  . Atrial fibrillation ([place]) 12/30/2013  . HLD (hyperlipidemia) 12/30/2013  . Preoperative cardiovascular examination 04/07/2013  . Hyperlipidemia 04/07/2013  . A-fib ([place])   . Coronary artery disease   . Chronic systolic heart failure [facility])     Alanson Puls, [place] DPT 09/08/2019, 12:02 PM  [place] MAIN [facility] SERVICES [address] [place], [place], [address] Phone: [phone]   Fax:  [phone]  Name: RAHEEL PANGALLO MRN: [id number] Date of Birth: 10/16/1934

## 2019-09-09 ENCOUNTER — Other Ambulatory Visit: Payer: Self-pay

## 2019-09-09 ENCOUNTER — Ambulatory Visit (INDEPENDENT_AMBULATORY_CARE_PROVIDER_SITE_OTHER): Payer: Medicare Other | Admitting: Cardiovascular Disease

## 2019-09-09 ENCOUNTER — Encounter: Payer: Self-pay | Admitting: Cardiovascular Disease

## 2019-09-09 VITALS — BP 130/80 | HR 74 | Ht 61.0 in | Wt 103.0 lb

## 2019-09-09 DIAGNOSIS — I5022 Chronic systolic (congestive) heart failure: Secondary | ICD-10-CM | POA: Diagnosis not present

## 2019-09-09 DIAGNOSIS — I25118 Atherosclerotic heart disease of native coronary artery with other forms of angina pectoris: Secondary | ICD-10-CM

## 2019-09-09 DIAGNOSIS — E785 Hyperlipidemia, unspecified: Secondary | ICD-10-CM

## 2019-09-09 NOTE — Patient Instructions (Signed)
Medication Instructions:  Your physician recommends that you continue on your current medications as directed. Please refer to the Current Medication list given to you today.  *If you need a refill on your cardiac medications before your next appointment, please call your pharmacy*  Lab Work: None ordered If you have labs (blood work) drawn today and your tests are completely normal, you will receive your results only by: . MyChart Message (if you have MyChart) OR . A paper copy in the mail If you have any lab test that is abnormal or we need to change your treatment, we will call you to review the results.  Testing/Procedures: None ordered  Follow-Up: At [facility], you and your health needs are our priority.  As part of our continuing mission to provide you with exceptional heart care, we have created designated Provider Care Teams.  These Care Teams include your primary Cardiologist (physician) and Advanced Practice Providers (APPs -  Physician Assistants and Nurse Practitioners) who all work together to provide you with the care you need, when you need it.  Your next appointment:   2 months  The format for your next appointment:   In Person  Provider:    You may see Muhammad Arida, MD or one of the following Advanced Practice Providers on your designated Care Team:    Christopher Berge, NP  Ryan Dunn, PA-C  Jacquelyn Visser, PA-C   Other Instructions N/A  

## 2019-09-09 NOTE — Progress Notes (Signed)
Cardiology Office Note   Date:  09/09/2019   ID:  Jeanne Pittman, DOB 11/24/1934, MRN [id number]  PCP:  [name], MD  Cardiologist:   Kathlyn Sacramento, MD   Chief Complaint  Patient presents with  . office visit    1 month F/U; Meds verbally reviewed with patient.      History of Present Illness: Jeanne Pittman is a 85 y.o. female who presents for a followup visit regarding coronary artery disease and chronic systolic heart failure due to ischemic cardiomyopathy. She had anterior ST elevation myocardial infarction in December 2013 with late presentation complicated by cardiogenic shock. Emergent cardiac catheterization showed an occluded mid LAD and 95% stenosis in the proximal RCA. 2 drug-eluting stents were placed in the mid LAD and 1 drug-eluting stent to the proximal RCA. Ejection fraction was 25-30% with akinesis of the mid distal anterior, apical and distal inferior wall.  She has known history of non-Hodgkin's lymphoma which is being observed. She has known history of myalgia with atorvastatin but she has been tolerating rosuvastatin.  Most recent echocardiogram in August 2018 showed an EF of 40 to 45% with mild aortic and mitral regurgitation and moderate tricuspid regurgitation.  There was mild pulmonary hypertension.  Previous Leane Call was  in August 2018 showed evidence of prior anterior infarct without significant ischemia.  She was seen last month for increased fatigue and shortness of breath.  She underwent an echocardiogram which showed stable EF of 40 to 45%.  Lexiscan Myoview showed mildly reduced ejection fraction with fixed anterior apical defect consistent with prior infarct with minimal peri-infarct ischemia.  No significant change from previous test in 2018.  She is having increased lower back pain and she is also having issues with her balance.  She started physical therapy recently.  Past Medical History:  Diagnosis Date  . A-fib ([place])    07/2012: A. fib with RVR in the setting of myocardial infarction. Converted to sinus rhythm with amiodarone. No recurrence.  . Anxiety   . Atrophic vaginitis   . CHF (congestive heart failure) ([place])   . Chronic combined systolic (congestive) and diastolic (congestive) heart failure ([place])    a. EF was 25-35% post MI but improved to 45-50% in 04/2013; b. 03/2017 Echo: EF 40-45%, mod focal/basal hypertrophy of septum. Sev mid-apicalanteroseptal, ant, and apical HK. Gr1 DD, mild AI/MR, mod TR, PASP 69mHg.  . Colon adenomas   . Coronary artery disease    a. 99991111 STEMI complicated by cardiogenic shock. Cath/PCI: LAD 1102m (DESx2), RCA 95p(DES). EF 25%; b. 03/2017 MV: EF 57%, fixed apical, periapical, mid to distal anteroseptal defect. No ischemia.  . Eczema   . Fibrocystic breast disease   . Gastritis   . GERD (gastroesophageal reflux disease)   . Glaucoma   . Headache   . History of gastritis   . Hyperlipidemia   . Hypertension   . Hypothyroidism   . Insomnia   . Ischemic cardiomyopathy    a. 07/2012 EF 25-35% following MI-->Improved to 45-50%;  b. 03/2017 Echo: EF 40-45%.  [name] Lymphoma ([place])   . Myocardial infarction ([place]) 08/18/2014  . Non Hodgkin's lymphoma ([place]) 2005   reoccurance 2007 and 2012  . Osteoarthritis   . Osteoporosis   . Polyneuropathy   . Polyposis of colon   . Restless leg syndrome   . Urinary, incontinence, stress female     Past Surgical History:  Procedure Laterality Date  . ABDOMINAL HYSTERECTOMY  1956  .  APPENDECTOMY    . AXILLARY LYMPH NODE BIOPSY Left 03/18/2015   Procedure: AXILLARY LYMPH NODE BIOPSY;  Surgeon: [name], MD;  Location: [name];  Service: General;  Laterality: Left;  . CARDIAC CATHETERIZATION  08/19/2012   [name]; [place]  . COLONOSCOPY    . COLONOSCOPY WITH PROPOFOL N/A 03/02/2016   Procedure: COLONOSCOPY WITH PROPOFOL;  Surgeon: Manya Silvas, MD;  Location: [facility] ENDOSCOPY;  Service: Endoscopy;  Laterality: N/A;  . COLONOSCOPY  WITH PROPOFOL N/A 09/26/2017   Procedure: COLONOSCOPY WITH PROPOFOL;  Surgeon: Manya Silvas, MD;  Location: [facility] ENDOSCOPY;  Service: Endoscopy;  Laterality: N/A;  . CORONARY ANGIOPLASTY WITH STENT PLACEMENT     x3 stents  . CORONARY ANGIOPLASTY WITH STENT PLACEMENT    . CORONARY ARTERY BYPASS GRAFT    . ESOPHAGOGASTRODUODENOSCOPY (EGD) WITH PROPOFOL N/A 03/02/2016   Procedure: ESOPHAGOGASTRODUODENOSCOPY (EGD) WITH PROPOFOL;  Surgeon: Manya Silvas, MD;  Location[facility] ENDOSCOPY;  Service: Endoscopy;  Laterality: N/A;  . ESOPHAGOGASTRODUODENOSCOPY (EGD) WITH PROPOFOL N/A 04/14/2019   Procedure: ESOPHAGOGASTRODUODENOSCOPY (EGD) WITH PROPOFOL;  Surgeon: Toledo, Benay Pike, MD;  Location: [name] ENDOSCOPY;  Service: Gastroenterology;  Laterality: N/A;  . LYMPH NODE BIOPSY Right 07-24-11   Dr Bary Castilla  . MOHS SURGERY     bilateral shoulders  . PTCA    . skin cancer removal    . TOTAL ABDOMINAL HYSTERECTOMY W/ BILATERAL SALPINGOOPHORECTOMY       Current Outpatient Medications  Medication Sig Dispense Refill  . acetaminophen (TYLENOL) 650 MG CR tablet Take 650 mg by mouth every 8 (eight) hours as needed for pain.    [name] aspirin 81 MG tablet Take 81 mg by mouth every morning.     . carvedilol (COREG) 3.125 MG tablet TAKE 1 TABLET BY MOUTH TWICE DAILY WITH A MEAL 60 tablet 3  . Cholecalciferol (VITAMIN D-3) 5000 UNITS TABS Take 2,000 Int'l Units by mouth every morning.     . fexofenadine (ALLEGRA) 180 MG tablet Take 180 mg by mouth daily as needed for rhinitis.     . furosemide (LASIX) 20 MG tablet Take 1 tablet (20 mg total) by mouth daily as needed. 30 tablet 6  . gabapentin (NEURONTIN) 300 MG capsule Take 300 mg by mouth as needed.    . latanoprost (XALATAN) 0.005 % ophthalmic solution Place 1 drop into both eyes at bedtime.     [name] LORazepam (ATIVAN) 0.5 MG tablet Take 0.5 mg by mouth every 8 (eight) hours as needed for anxiety.     [name] losartan (COZAAR) 25 MG tablet TAKE 1/2 TABLET(12.5 MG) BY  MOUTH DAILY AFTER BREAKFAST 45 tablet 3  . pantoprazole (PROTONIX) 40 MG tablet TAKE 1 TABLET(40 MG) BY MOUTH TWICE DAILY 60 tablet 1  . rosuvastatin (CRESTOR) 10 MG tablet TAKE 1 TABLET(10 MG) BY MOUTH EVERY OTHER DAY 45 tablet 3  . timolol (TIMOPTIC) 0.5 % ophthalmic solution Place 1 drop into both eyes 2 (two) times daily.      No current facility-administered medications for this visit.    Allergies:   Ace inhibitors, Benadryl [diphenhydramine hcl], Codeine, Diphenhydramine, Guaiacol, Hydrocodone, and Guaifenesin & derivatives    Social History:  The patient  reports that she quit smoking about 71 years ago. Her smoking use included cigarettes. She has never used smokeless tobacco. She reports that she does not drink alcohol or use drugs.   Family History:  The patient's family history includes Heart attack in her father; Heart disease in her brother; Leukemia in  her mother.    ROS:  Please see the history of present illness.   Otherwise, review of systems are positive for none.   All other systems are reviewed and negative.    PHYSICAL EXAM: VS:  BP 130/80 (BP Location: Left Arm, Patient Position: Sitting, Cuff Size: Normal)   Pulse 74   Ht 5\' 1"  (1.549 m)   Wt 103 lb (46.7 kg)   SpO2 98%   BMI 19.46 kg/m  , BMI Body mass index is 19.46 kg/m. GEN: Well nourished, well developed, in no acute distress  HEENT: normal  Neck: no JVD, carotid bruits, or masses Cardiac: RRR; no murmurs, rubs, or gallops,no edema  Respiratory:  clear to auscultation bilaterally, normal work of breathing GI: soft, nontender, nondistended, + BS MS: no deformity or atrophy  Skin: warm and dry, no rash Neuro:  Strength and sensation are intact Psych: euthymic mood, full affect Distal pulses are palpable and normal.  EKG:  EKG is ordered today. The ekg ordered today demonstrates normal sinus rhythm with old anterior infarct.  Recent Labs: 08/29/2019: ALT 10; BUN 30; Creatinine, Ser 0.92; Hemoglobin  12.1; Platelets 133; Potassium 4.4; Sodium 138    Lipid Panel    Component Value Date/Time   CHOL 172 09/02/2015 0824   TRIG 111 09/02/2015 0824   HDL 51 09/02/2015 0824   CHOLHDL 3.4 09/02/2015 0824   LDLCALC 99 09/02/2015 0824      Wt Readings from Last 3 Encounters:  09/09/19 103 lb (46.7 kg)  08/29/19 101 lb 8 oz (46 kg)  07/23/19 103 lb (46.7 kg)        ASSESSMENT AND PLAN:  1.  Coronary artery disease involving native coronary arteries with worsening exertional dyspnea: Although this could be angina equivalent, recent echocardiogram and Lexiscan Myoview did not look any different from before.  Shortness of breath is likely multifactorial with an element of physical deconditioning.  The patient is currently undergoing physical therapy for her back and hopefully this will improve her functioning level.  I will evaluate her again in 2 months from now and if there is no improvement in shortness of breath, the next step is to proceed with left heart catheterization to evaluate patency of the stents.  I discussed this with the patient and her son was present on the phone today.    2. Hyperlipidemia: She is tolerating rosuvastatin.  I reviewed most recent lipid profile in August which showed an LDL of 70 and triglyceride of 106.  3. Ischemic cardiomyopathy: Most recent ejection fraction was 40-45 %. Continue low-dose carvedilol and losartan. She is euvolemic.   Disposition:   FU with me in 2 months  Signed,  Kathlyn Sacramento, MD  09/09/2019 2:46 PM    [facility]

## 2019-09-10 ENCOUNTER — Encounter: Payer: Self-pay | Admitting: Physical Therapy

## 2019-09-10 ENCOUNTER — Ambulatory Visit: Payer: Medicare Other | Admitting: Physical Therapy

## 2019-09-10 DIAGNOSIS — R2689 Other abnormalities of gait and mobility: Secondary | ICD-10-CM

## 2019-09-10 DIAGNOSIS — M6281 Muscle weakness (generalized): Secondary | ICD-10-CM

## 2019-09-10 DIAGNOSIS — R2681 Unsteadiness on feet: Secondary | ICD-10-CM

## 2019-09-10 NOTE — Therapy (Signed)
[place] MAIN [facility] SERVICES [address] [place], [place], [address] Phone: [phone]   Fax:  [phone]  Physical Therapy Treatment  Patient Details  Name: Jeanne Pittman MRN: [id number] Date of Birth: 13-Jun-1935 Referring Provider (PT): Jennings Books   Encounter Date: 09/10/2019  PT End of Session - 09/10/19 1150    Visit Number  2    Number of Visits  16    Date for PT Re-Evaluation  10/30/19    Authorization Type  1/10 eval 09/04/19    PT Start Time  1145    PT Stop Time  1225    PT Time Calculation (min)  40 min    Equipment Utilized During Treatment  Gait belt    Activity Tolerance  Patient tolerated treatment well    Behavior During Therapy  WFL for tasks assessed/performed       Past Medical History:  Diagnosis Date  . A-fib ([place])    07/2012: A. fib with RVR in the setting of myocardial infarction. Converted to sinus rhythm with amiodarone. No recurrence.  . Anxiety   . Atrophic vaginitis   . CHF (congestive heart failure) ([place])   . Chronic combined systolic (congestive) and diastolic (congestive) heart failure ([place])    a. EF was 25-35% post MI but improved to 45-50% in 04/2013; b. 03/2017 Echo: EF 40-45%, mod focal/basal hypertrophy of septum. Sev mid-apicalanteroseptal, ant, and apical HK. Gr1 DD, mild AI/MR, mod TR, PASP 29mHg.  . Colon adenomas   . Coronary artery disease    a. 99991111 STEMI complicated by cardiogenic shock. Cath/PCI: LAD 176m (DESx2), RCA 95p(DES). EF 25%; b. 03/2017 MV: EF 57%, fixed apical, periapical, mid to distal anteroseptal defect. No ischemia.  . Eczema   . Fibrocystic breast disease   . Gastritis   . GERD (gastroesophageal reflux disease)   . Glaucoma   . Headache   . History of gastritis   . Hyperlipidemia   . Hypertension   . Hypothyroidism   . Insomnia   . Ischemic cardiomyopathy    a. 07/2012 EF 25-35% following MI-->Improved to 45-50%;  b. 03/2017 Echo: EF 40-45%.  [name] Lymphoma ([place])    . Myocardial infarction ([place]) 08/18/2014  . Non Hodgkin's lymphoma ([place]) 2005   reoccurance 2007 and 2012  . Osteoarthritis   . Osteoporosis   . Polyneuropathy   . Polyposis of colon   . Restless leg syndrome   . Urinary, incontinence, stress female     Past Surgical History:  Procedure Laterality Date  . ABDOMINAL HYSTERECTOMY  1956  . APPENDECTOMY    . AXILLARY LYMPH NODE BIOPSY Left 03/18/2015   Procedure: AXILLARY LYMPH NODE BIOPSY;  Surgeon: [name], MD;  Location: [name];  Service: General;  Laterality: Left;  . CARDIAC CATHETERIZATION  08/19/2012   [name]; [place]  . COLONOSCOPY    . COLONOSCOPY WITH PROPOFOL N/A 03/02/2016   Procedure: COLONOSCOPY WITH PROPOFOL;  Surgeon: Manya Silvas, MD;  [facility];  Service: Endoscopy;  Laterality: N/A;  . COLONOSCOPY WITH PROPOFOL N/A 09/26/2017   Procedure: COLONOSCOPY WITH PROPOFOL;  Surgeon: Manya Silvas, MD;  Location: [facility] ENDOSCOPY;  Service: Endoscopy;  Laterality: N/A;  . CORONARY ANGIOPLASTY WITH STENT PLACEMENT     x3 stents  . CORONARY ANGIOPLASTY WITH STENT PLACEMENT    . CORONARY ARTERY BYPASS GRAFT    . ESOPHAGOGASTRODUODENOSCOPY (EGD) WITH PROPOFOL N/A 03/02/2016   Procedure: ESOPHAGOGASTRODUODENOSCOPY (EGD) WITH PROPOFOL;  Surgeon: Gavin Pound  Vira Agar, MD;  Location: [place] ENDOSCOPY;  Service: Endoscopy;  Laterality: N/A;  . ESOPHAGOGASTRODUODENOSCOPY (EGD) WITH PROPOFOL N/A 04/14/2019   Procedure: ESOPHAGOGASTRODUODENOSCOPY (EGD) WITH PROPOFOL;  Surgeon: Toledo, Benay Pike, MD;  Location: [name] ENDOSCOPY;  Service: Gastroenterology;  Laterality: N/A;  . LYMPH NODE BIOPSY Right 07-24-11   Dr Bary Castilla  . MOHS SURGERY     bilateral shoulders  . PTCA    . skin cancer removal    . TOTAL ABDOMINAL HYSTERECTOMY W/ BILATERAL SALPINGOOPHORECTOMY      There were no vitals filed for this visit.  Subjective Assessment - 09/10/19 1149    Subjective  Patient is a pleasant 85 year old female who presents  to physical therapy for imbalance.She has no pain and no new concerns.    Pertinent History  Patient is a pleasant 85 year old female who presents to physical therapy for imbalance. Per previous documentation patient has been having unsteadiness for several months and burning in bilateral LE's and feet since 2015 after having a heart attack. PMH includes A fib, MI s/p stent placement, lymphoma, osteoporosis, ASCVD, atherosclerosis, atrophic vaginitis, CHF, HTN, fibrocystic disease of breast, GERD, glaucoma, HLD, colonic polyps, HLD, hypothyroidism, migraine, stress incontinence, polyneuropathy. It has been since October since she saw the physician for right sided weakness. Is having some pain in right hip, buttocks, leg, and foot. Foot is stabbing when first gets up and aches in the back of knee. Usually stumbles around corners.    Limitations  Lifting;Standing;Walking;House hold activities    How long can you sit comfortably?  all day    How long can you stand comfortably?  gets fatigued    How long can you walk comfortably?  2 blocks    Patient Stated Goals  to walk further    Currently in Pain?  No/denies    Pain Score  0-No pain       Neuromuscular Re-education  Rocker board fwd/bwd, side to side x 20 each direction Tandem gait on blue balance without UE support x 2 lengths Side stepping on blue balance without UE support x 2 lengths 1/2 foam roll balance with flat side up 30s x 2 reps 1/2 foam roll balance with flat side down 30s x 2 reps 1/2 foam roll tandem balance alternating forward LE 30s x 2 each LE forward Lateral side steps over hurdle left and right x 15  fwd/ bwd steps over hurdle left and right x 15 Leg press 25 lbs x 20 x 3       Pt educated throughout session about proper posture and technique with exercises. Improved exercise technique, movement at target joints, use of target muscles after min to mod verbal, visual, tactile cues. CGA and Min to mod verbal cues used  throughout with increased in postural sway and LOB most seen with narrow base of support and while on uneven surfaces. Continues to have balance deficits typical with diagnosis. Patient performs intermediate level exercises without pain behaviors and needs verbal cuing for postural alignment and head positioning                        PT Education - 09/10/19 1150    Education Details  HEP    Person(s) Educated  Patient    Methods  Explanation;Demonstration    Comprehension  Verbalized understanding;Returned demonstration       PT Short Term Goals - 09/05/19 1242      PT SHORT TERM GOAL #1  Title  Patient will be independent in home exercise program to improve strength/mobility for better functional independence with ADLs.    Baseline  1/14: give next session    Time  2    Period  Weeks    Status  New    Target Date  09/19/19        PT Long Term Goals - 09/05/19 1243      PT LONG TERM GOAL #1   Title  Patient will increase Berg Balance score by > 6 points (49/56)  to demonstrate decreased fall risk during functional activities.    Baseline  1/14: 43/56    Time  8    Period  Weeks    Status  New    Target Date  10/30/19      PT LONG TERM GOAL #2   Title  Patient will increase ABC scale score >80% to demonstrate better functional mobility and better confidence with ADLs    Baseline  1/14/ 49.4%    Time  8    Period  Weeks    Status  New    Target Date  10/30/19      PT LONG TERM GOAL #3   Title  Patient will increase dynamic gait index score to >19/24 as to demonstrate reduced fall risk and improved dynamic gait balance for better safety with community/home ambulation.    Baseline  1/14: 16/24    Time  8    Period  Weeks    Status  New    Target Date  10/30/19      PT LONG TERM GOAL #4   Title  Patient will increase BLE gross strength to 4+/5 as to improve functional strength for independent gait, increased standing tolerance and increased ADL  ability.    Baseline  1/14: R 3+/5, L 4-/5    Time  8    Period  Weeks    Status  New    Target Date  10/30/19            Plan - 09/10/19 1150    Clinical Impression Statement  Pt presents with unsteadiness on uneven surfaces and fatigues with therapeutic exercises. Patient needs assist with uneven surfaces and needs CGA assist with single leg activities.   Patient tolerated all interventions well this date and will benefit from continued skilled PT interventions to improve strength and balance and decrease risk of falling    Personal Factors and Comorbidities  Age;Comorbidity 3+;Past/Current Experience;Social Background;Time since onset of injury/illness/exacerbation    Comorbidities  A fib, MI s/p stent placement, lymphoma, osteoporosis, ASCVD, atherosclerosis, atrophic vaginitis, CHF, HTN, fibrocystic disease of breast, GERD, glaucoma, HLD, colonic polyps, HLD, hypothyroidism, migraine, stress incontinence, polyneuropathy    Examination-Activity Limitations  Bend;Caring for Others;Carry;Continence;Dressing;Stairs;Squat;Sit;Reach Overhead;Locomotion Level;Lift;Stand;Toileting    Examination-Participation Restrictions  Church;Cleaning;Community Activity;Interpersonal Relationship;Laundry;Volunteer;Shop;Meal Prep;Yard Work    [employer] officer  Evolving/Moderate complexity    Rehab Potential  Good    PT Frequency  2x / week    PT Duration  8 weeks    PT Treatment/Interventions  ADLs/Self Care Home Management;Aquatic Therapy;Biofeedback;Canalith Repostioning;Cryotherapy;Electrical Stimulation;Moist Heat;Traction;Ultrasound;DME Instruction;Gait training;Stair training;Functional mobility training;Therapeutic activities;Therapeutic exercise;Balance training;Neuromuscular re-education;Patient/family education;Manual techniques;Passive range of motion;Dry needling;Energy conservation;Taping;Vestibular    PT Next Visit Plan  give HEP, single limb strength and stability    PT Home  Exercise Plan  give next session    Consulted and Agree with Plan of Care  Patient       Patient will benefit from  skilled therapeutic intervention in order to improve the following deficits and impairments:  Abnormal gait, Cardiopulmonary status limiting activity, Decreased activity tolerance, Decreased balance, Decreased endurance, Difficulty walking, Decreased strength, Postural dysfunction, Improper body mechanics  Visit Diagnosis: Unsteadiness on feet  Other abnormalities of gait and mobility  Muscle weakness (generalized)     Problem List Patient Active Problem List   Diagnosis Date Noted  . 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 H/O neoplasm 01/04/2015  . Arterial vascular disease 12/30/2013  . BP (high blood pressure) 12/30/2013  . Adult hypothyroidism 12/30/2013  . OP (osteoporosis) 12/30/2013  . Avitaminosis D 12/30/2013  . Atrial fibrillation ([place]) 12/30/2013  . HLD (hyperlipidemia) 12/30/2013  . Preoperative cardiovascular examination 04/07/2013  . Hyperlipidemia 04/07/2013  . A-fib ([place])   . Coronary artery disease   . Chronic systolic heart failure [facility])     Alanson Puls, [place] DPT 09/10/2019, 11:52 AM  [place] MAIN [facility] SERVICES [address] [place], [place], [address] Phone: [phone]   Fax:  [phone]  Name: Jeanne Pittman MRN: [id number] Date of Birth: 04-01-1935

## 2019-09-15 ENCOUNTER — Ambulatory Visit: Payer: Medicare Other | Admitting: Physical Therapy

## 2019-09-15 ENCOUNTER — Other Ambulatory Visit: Payer: Self-pay

## 2019-09-15 DIAGNOSIS — M6281 Muscle weakness (generalized): Secondary | ICD-10-CM

## 2019-09-15 DIAGNOSIS — R2681 Unsteadiness on feet: Secondary | ICD-10-CM

## 2019-09-15 DIAGNOSIS — R2689 Other abnormalities of gait and mobility: Secondary | ICD-10-CM

## 2019-09-15 NOTE — Therapy (Signed)
[place] MAIN [facility] SERVICES [address] [place], [place], [address] Phone: [phone]   Fax:  [phone]  Physical Therapy Treatment  Patient Details  Name: Jeanne Pittman MRN: [id number] Date of Birth: [birth year] Referring Provider (PT): Jennings Books   Encounter Date: 09/15/2019  PT End of Session - 09/15/19 1207    Visit Number  4    Number of Visits  16    Date for PT Re-Evaluation  10/30/19    Authorization Type  1/10 eval 09/04/19    PT Start Time  1145    PT Stop Time  1225    PT Time Calculation (min)  40 min    Equipment Utilized During Treatment  Gait belt    Activity Tolerance  Patient tolerated treatment well    Behavior During Therapy  WFL for tasks assessed/performed       Past Medical History:  Diagnosis Date  . A-fib ([place])    07/2012: A. fib with RVR in the setting of myocardial infarction. Converted to sinus rhythm with amiodarone. No recurrence.  . Anxiety   . Atrophic vaginitis   . CHF (congestive heart failure) ([employer])   . Chronic combined systolic (congestive) and diastolic (congestive) heart failure ([place])    a. EF was 25-35% post MI but improved to 45-50% in 04/2013; b. 03/2017 Echo: EF 40-45%, mod focal/basal hypertrophy of septum. Sev mid-apicalanteroseptal, ant, and apical HK. Gr1 DD, mild AI/MR, mod TR, PASP 19mHg.  . Colon adenomas   . Coronary artery disease    a. 99991111 STEMI complicated by cardiogenic shock. Cath/PCI: LAD 119m (DESx2), RCA 95p(DES). EF 25%; b. 03/2017 MV: EF 57%, fixed apical, periapical, mid to distal anteroseptal defect. No ischemia.  . Eczema   . Fibrocystic breast disease   . Gastritis   . GERD (gastroesophageal reflux disease)   . Glaucoma   . Headache   . History of gastritis   . Hyperlipidemia   . Hypertension   . Hypothyroidism   . Insomnia   . Ischemic cardiomyopathy    a. 07/2012 EF 25-35% following MI-->Improved to 45-50%;  b. 03/2017 Echo: EF 40-45%.  [name] Lymphoma ([place])    . Myocardial infarction ([place]) 08/18/2014  . Non Hodgkin's lymphoma ([place]) 2005   reoccurance 2007 and 2012  . Osteoarthritis   . Osteoporosis   . Polyneuropathy   . Polyposis of colon   . Restless leg syndrome   . Urinary, incontinence, stress female     Past Surgical History:  Procedure Laterality Date  . ABDOMINAL HYSTERECTOMY  1956  . APPENDECTOMY    . AXILLARY LYMPH NODE BIOPSY Left 03/18/2015   Procedure: AXILLARY LYMPH NODE BIOPSY;  Surgeon: [name], MD;  Location: [name];  Service: General;  Laterality: Left;  . CARDIAC CATHETERIZATION  08/19/2012   [name]; [place]  . COLONOSCOPY    . COLONOSCOPY WITH PROPOFOL N/A 03/02/2016   Procedure: COLONOSCOPY WITH PROPOFOL;  Surgeon: Manya Silvas, MD;  Location: [facility] ENDOSCOPY;  Service: Endoscopy;  Laterality: N/A;  . COLONOSCOPY WITH PROPOFOL N/A 09/26/2017   Procedure: COLONOSCOPY WITH PROPOFOL;  Surgeon: Manya Silvas, MD;  [facility] ENDOSCOPY;  Service: Endoscopy;  Laterality: N/A;  . CORONARY ANGIOPLASTY WITH STENT PLACEMENT     x3 stents  . CORONARY ANGIOPLASTY WITH STENT PLACEMENT    . CORONARY ARTERY BYPASS GRAFT    . ESOPHAGOGASTRODUODENOSCOPY (EGD) WITH PROPOFOL N/A 03/02/2016   Procedure: ESOPHAGOGASTRODUODENOSCOPY (EGD) WITH PROPOFOL;  Surgeon: Gavin Pound  Vira Agar, MD;  Location: [place] ENDOSCOPY;  Service: Endoscopy;  Laterality: N/A;  . ESOPHAGOGASTRODUODENOSCOPY (EGD) WITH PROPOFOL N/A 04/14/2019   Procedure: ESOPHAGOGASTRODUODENOSCOPY (EGD) WITH PROPOFOL;  Surgeon: Toledo, Benay Pike, MD;  Location: [name] ENDOSCOPY;  Service: Gastroenterology;  Laterality: N/A;  . LYMPH NODE BIOPSY Right 07-24-11   Dr Bary Castilla  . MOHS SURGERY     bilateral shoulders  . PTCA    . skin cancer removal    . TOTAL ABDOMINAL HYSTERECTOMY W/ BILATERAL SALPINGOOPHORECTOMY      There were no vitals filed for this visit.  Subjective Assessment - 09/15/19 1206    Subjective  Patient has no pain today.    Pertinent History   Patient is a pleasant 85 year old female who presents to physical therapy for imbalance. Per previous documentation patient has been having unsteadiness for several months and burning in bilateral LE's and feet since 2015 after having a heart attack. PMH includes A fib, MI s/p stent placement, lymphoma, osteoporosis, ASCVD, atherosclerosis, atrophic vaginitis, CHF, HTN, fibrocystic disease of breast, GERD, glaucoma, HLD, colonic polyps, HLD, hypothyroidism, migraine, stress incontinence, polyneuropathy. It has been since October since she saw the physician for right sided weakness. Is having some pain in right hip, buttocks, leg, and foot. Foot is stabbing when first gets up and aches in the back of knee. Usually stumbles around corners.    Limitations  Lifting;Standing;Walking;House hold activities    How long can you sit comfortably?  all day    How long can you stand comfortably?  gets fatigued    How long can you walk comfortably?  2 blocks    Patient Stated Goals  to walk further    Currently in Pain?  No/denies    Pain Score  0-No pain       Treatment: Neuromuscular training: side stepping left and right in parallel bars 10 feet x 3 standing on blue foam with head turns x 1 min  Standing feet together on blue foam with head turns x 1 min step ups from floor to 6 inch stool x 20 bilateral Step ups from blue foam to 6 inch stool x 20 BLE sit to stand x 10 marching in parallel bars x 20  Pt educated throughout session about proper posture and technique with exercises. Improved exercise technique, movement at target joints, use of target muscles after min to mod verbal, visual, tactile cues. CGA and Min to mod verbal cues used throughout with increased in postural sway and LOB most seen with narrow base of support and while on uneven surfaces. Continues to have balance deficits typical with diagnosis. Patient performs intermediate level exercises without pain behaviors and needs verbal cuing  for postural alignment and head positioning Tactile cues and assistance needed to keep lower leg and knee in neutral to avoid compensations with ankle motions.                        PT Education - 09/15/19 1207    Education Details  HEP    Person(s) Educated  Patient    Methods  Explanation    Comprehension  Verbalized understanding;Returned demonstration;Need further instruction       PT Short Term Goals - 09/05/19 1242      PT SHORT TERM GOAL #1   Title  Patient will be independent in home exercise program to improve strength/mobility for better functional independence with ADLs.    Baseline  1/14: give next session  Time  2    Period  Weeks    Status  New    Target Date  09/19/19        PT Long Term Goals - 09/05/19 1243      PT LONG TERM GOAL #1   Title  Patient will increase Berg Balance score by > 6 points (49/56)  to demonstrate decreased fall risk during functional activities.    Baseline  1/14: 43/56    Time  8    Period  Weeks    Status  New    Target Date  10/30/19      PT LONG TERM GOAL #2   Title  Patient will increase ABC scale score >80% to demonstrate better functional mobility and better confidence with ADLs    Baseline  1/14/ 49.4%    Time  8    Period  Weeks    Status  New    Target Date  10/30/19      PT LONG TERM GOAL #3   Title  Patient will increase dynamic gait index score to >19/24 as to demonstrate reduced fall risk and improved dynamic gait balance for better safety with community/home ambulation.    Baseline  1/14: 16/24    Time  8    Period  Weeks    Status  New    Target Date  10/30/19      PT LONG TERM GOAL #4   Title  Patient will increase BLE gross strength to 4+/5 as to improve functional strength for independent gait, increased standing tolerance and increased ADL ability.    Baseline  1/14: R 3+/5, L 4-/5    Time  8    Period  Weeks    Status  New    Target Date  10/30/19            Plan -  09/15/19 1212    Clinical Impression Statement  Patient instructed in beginning balance and coordination exercise. Patient required mod VCs and min A for gait to improve weight shift and postural control. Patient requires min VCs to improve ankle stability with gait.  Patients would benefit from additional skilled PT intervention to improve balance/gait safety and reduce fall risk.   Personal Factors and Comorbidities  Age;Comorbidity 3+;Past/Current Experience;Social Background;Time since onset of injury/illness/exacerbation    Comorbidities  A fib, MI s/p stent placement, lymphoma, osteoporosis, ASCVD, atherosclerosis, atrophic vaginitis, CHF, HTN, fibrocystic disease of breast, GERD, glaucoma, HLD, colonic polyps, HLD, hypothyroidism, migraine, stress incontinence, polyneuropathy    Examination-Activity Limitations  Bend;Caring for Others;Carry;Continence;Dressing;Stairs;Squat;Sit;Reach Overhead;Locomotion Level;Lift;Stand;Toileting    Examination-Participation Restrictions  Church;Cleaning;Community Activity;Interpersonal Relationship;Laundry;Volunteer;Shop;Meal Prep;Yard Work    [employer] officer  Evolving/Moderate complexity    Rehab Potential  Good    PT Frequency  2x / week    PT Duration  8 weeks    PT Treatment/Interventions  ADLs/Self Care Home Management;Aquatic Therapy;Biofeedback;Canalith Repostioning;Cryotherapy;Electrical Stimulation;Moist Heat;Traction;Ultrasound;DME Instruction;Gait training;Stair training;Functional mobility training;Therapeutic activities;Therapeutic exercise;Balance training;Neuromuscular re-education;Patient/family education;Manual techniques;Passive range of motion;Dry needling;Energy conservation;Taping;Vestibular    PT Next Visit Plan  give HEP, single limb strength and stability    PT Home Exercise Plan  give next session    Consulted and Agree with Plan of Care  Patient       Patient will benefit from skilled therapeutic intervention in  order to improve the following deficits and impairments:  Abnormal gait, Cardiopulmonary status limiting activity, Decreased activity tolerance, Decreased balance, Decreased endurance, Difficulty walking, Decreased strength, Postural dysfunction, Improper body  mechanics  Visit Diagnosis: Unsteadiness on feet  Other abnormalities of gait and mobility  Muscle weakness (generalized)     Problem List Patient Active Problem List   Diagnosis Date Noted  . 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 H/O neoplasm 01/04/2015  . Arterial vascular disease 12/30/2013  . BP (high blood pressure) 12/30/2013  . Adult hypothyroidism 12/30/2013  . OP (osteoporosis) 12/30/2013  . Avitaminosis D 12/30/2013  . Atrial fibrillation ([place]) 12/30/2013  . HLD (hyperlipidemia) 12/30/2013  . Preoperative cardiovascular examination 04/07/2013  . Hyperlipidemia 04/07/2013  . A-fib ([place])   . Coronary artery disease   . Chronic systolic heart failure [facility])     Alanson Puls, [place] DPT 09/15/2019, 1:42 PM  [place] MAIN [facility] SERVICES [address] [place], [place], [address] Phone: [phone]   Fax:  [phone]  Name: Jeanne Pittman MRN: [id number] Date of Birth: 05-Jan-1935

## 2019-09-17 ENCOUNTER — Encounter: Payer: Self-pay | Admitting: Physical Therapy

## 2019-09-17 ENCOUNTER — Other Ambulatory Visit: Payer: Self-pay

## 2019-09-17 ENCOUNTER — Ambulatory Visit: Payer: Medicare Other | Admitting: Physical Therapy

## 2019-09-17 DIAGNOSIS — R2681 Unsteadiness on feet: Secondary | ICD-10-CM | POA: Diagnosis not present

## 2019-09-17 DIAGNOSIS — R2689 Other abnormalities of gait and mobility: Secondary | ICD-10-CM

## 2019-09-17 DIAGNOSIS — M6281 Muscle weakness (generalized): Secondary | ICD-10-CM

## 2019-09-17 NOTE — Therapy (Signed)
[place] MAIN [facility] SERVICES [address] [place], [place], [address] Phone: [phone]   Fax:  [phone]  Physical Therapy Treatment  Patient Details  Name: Jeanne Pittman MRN: [id number] Date of Birth: 06-13-1935 Referring Provider (PT): Jennings Books   Encounter Date: 09/17/2019  PT End of Session - 09/17/19 1145    Visit Number  5    Number of Visits  16    Date for PT Re-Evaluation  10/30/19    Authorization Type  1/10 eval 09/04/19    PT Start Time  1140    PT Stop Time  1225    PT Time Calculation (min)  45 min    Equipment Utilized During Treatment  Gait belt    Activity Tolerance  Patient tolerated treatment well    Behavior During Therapy  WFL for tasks assessed/performed       Past Medical History:  Diagnosis Date  . A-fib ([place])    07/2012: A. fib with RVR in the setting of myocardial infarction. Converted to sinus rhythm with amiodarone. No recurrence.  . Anxiety   . Atrophic vaginitis   . CHF (congestive heart failure) ([place])   . Chronic combined systolic (congestive) and diastolic (congestive) heart failure ([place])    a. EF was 25-35% post MI but improved to 45-50% in 04/2013; b. 03/2017 Echo: EF 40-45%, mod focal/basal hypertrophy of septum. Sev mid-apicalanteroseptal, ant, and apical HK. Gr1 DD, mild AI/MR, mod TR, PASP 57mHg.  . Colon adenomas   . Coronary artery disease    a. 99991111 STEMI complicated by cardiogenic shock. Cath/PCI: LAD 169m (DESx2), RCA 95p(DES). EF 25%; b. 03/2017 MV: EF 57%, fixed apical, periapical, mid to distal anteroseptal defect. No ischemia.  . Eczema   . Fibrocystic breast disease   . Gastritis   . GERD (gastroesophageal reflux disease)   . Glaucoma   . Headache   . History of gastritis   . Hyperlipidemia   . Hypertension   . Hypothyroidism   . Insomnia   . Ischemic cardiomyopathy    a. 07/2012 EF 25-35% following MI-->Improved to 45-50%;  b. 03/2017 Echo: EF 40-45%.  [name] Lymphoma ([place])    . Myocardial infarction ([place]) 08/18/2014  . Non Hodgkin's lymphoma ([place]) 2005   reoccurance 2007 and 2012  . Osteoarthritis   . Osteoporosis   . Polyneuropathy   . Polyposis of colon   . Restless leg syndrome   . Urinary, incontinence, stress female     Past Surgical History:  Procedure Laterality Date  . ABDOMINAL HYSTERECTOMY  1956  . APPENDECTOMY    . AXILLARY LYMPH NODE BIOPSY Left 03/18/2015   Procedure: AXILLARY LYMPH NODE BIOPSY;  Surgeon: [name], MD;  Location: [name];  Service: General;  Laterality: Left;  . CARDIAC CATHETERIZATION  08/19/2012   [name]; [place]  . COLONOSCOPY    . COLONOSCOPY WITH PROPOFOL N/A 03/02/2016   Procedure: COLONOSCOPY WITH PROPOFOL;  Surgeon: Manya Silvas, MD;  Location: [facility] ENDOSCOPY;  Service: Endoscopy;  Laterality: N/A;  . COLONOSCOPY WITH PROPOFOL N/A 09/26/2017   Procedure: COLONOSCOPY WITH PROPOFOL;  Surgeon: Manya Silvas, MD;  Location: [facility] ENDOSCOPY;  Service: Endoscopy;  Laterality: N/A;  . CORONARY ANGIOPLASTY WITH STENT PLACEMENT     x3 stents  . CORONARY ANGIOPLASTY WITH STENT PLACEMENT    . CORONARY ARTERY BYPASS GRAFT    . ESOPHAGOGASTRODUODENOSCOPY (EGD) WITH PROPOFOL N/A 03/02/2016   Procedure: ESOPHAGOGASTRODUODENOSCOPY (EGD) WITH PROPOFOL;  Surgeon: Gavin Pound  Vira Agar, MD;  Location: [place] ENDOSCOPY;  Service: Endoscopy;  Laterality: N/A;  . ESOPHAGOGASTRODUODENOSCOPY (EGD) WITH PROPOFOL N/A 04/14/2019   Procedure: ESOPHAGOGASTRODUODENOSCOPY (EGD) WITH PROPOFOL;  Surgeon: Toledo, Benay Pike, MD;  Location: [name] ENDOSCOPY;  Service: Gastroenterology;  Laterality: N/A;  . LYMPH NODE BIOPSY Right 07-24-11   Dr Bary Castilla  . MOHS SURGERY     bilateral shoulders  . PTCA    . skin cancer removal    . TOTAL ABDOMINAL HYSTERECTOMY W/ BILATERAL SALPINGOOPHORECTOMY      There were no vitals filed for this visit.  Subjective Assessment - 09/17/19 1145    Subjective  Patient has no pain today.    Pertinent History   Patient is a pleasant 85 year old female who presents to physical therapy for imbalance. Per previous documentation patient has been having unsteadiness for several months and burning in bilateral LE's and feet since 2015 after having a heart attack. PMH includes A fib, MI s/p stent placement, lymphoma, osteoporosis, ASCVD, atherosclerosis, atrophic vaginitis, CHF, HTN, fibrocystic disease of breast, GERD, glaucoma, HLD, colonic polyps, HLD, hypothyroidism, migraine, stress incontinence, polyneuropathy. It has been since October since she saw the physician for right sided weakness. Is having some pain in right hip, buttocks, leg, and foot. Foot is stabbing when first gets up and aches in the back of knee. Usually stumbles around corners.    Limitations  Lifting;Standing;Walking;House hold activities    How long can you sit comfortably?  all day    How long can you stand comfortably?  gets fatigued    How long can you walk comfortably?  2 blocks    Patient Stated Goals  to walk further    Currently in Pain?  No/denies    Pain Score  0-No pain         Ther-ex  Nu-step  x 5 mins  Squats x 15 with cues for correct posture Lunges to BOSU ball x 15 BLE Heel raises x 15 x 2 sets High marching x 20 BLE  Step ups to 6-inch stool x 20  Neuromuscular Re-education  Leg press 25 lbs x 20 BLE Tandem gait on foam without UE support x 2 lengths Side stepping on foam without UE support x 2 lengths Heel/toe raises without UE support 3s hold x 10 each 1/2 foam roll balance with flat side up 30s x 2 reps 1/2 foam roll balance with flat side down 30s x 2 reps 1/2 foam roll tandem balance alternating forward LE 30s x 2 each LE forward Lateral side steps from foam to 6 inch stool left and right x 15  Pt educated throughout session about proper posture and technique with exercises. Improved exercise technique, movement at target joints, use of target muscles after min to mod verbal, visual, tactile cues. CGA  and Min to mod verbal cues used throughout with increased in postural sway and LOB most seen with narrow base of support and while on uneven surfaces. Continues to have balance deficits typical with diagnosis. Patient performs intermediate level exercises without pain behaviors and needs verbal cuing for postural alignment and head positioning  Patient needs occasional verbal cueing to improve posture and cueing to correctly perform exercises slowly, holding at end of range to increase motor firing of desired muscle to encourage fatigue.                        PT Education - 09/17/19 1145    Education Details  HEP  Person(s) Educated  Patient    Methods  Explanation    Comprehension  Verbalized understanding       PT Short Term Goals - 09/05/19 1242      PT SHORT TERM GOAL #1   Title  Patient will be independent in home exercise program to improve strength/mobility for better functional independence with ADLs.    Baseline  1/14: give next session    Time  2    Period  Weeks    Status  New    Target Date  09/19/19        PT Long Term Goals - 09/05/19 1243      PT LONG TERM GOAL #1   Title  Patient will increase Berg Balance score by > 6 points (49/56)  to demonstrate decreased fall risk during functional activities.    Baseline  1/14: 43/56    Time  8    Period  Weeks    Status  New    Target Date  10/30/19      PT LONG TERM GOAL #2   Title  Patient will increase ABC scale score >80% to demonstrate better functional mobility and better confidence with ADLs    Baseline  1/14/ 49.4%    Time  8    Period  Weeks    Status  New    Target Date  10/30/19      PT LONG TERM GOAL #3   Title  Patient will increase dynamic gait index score to >19/24 as to demonstrate reduced fall risk and improved dynamic gait balance for better safety with community/home ambulation.    Baseline  1/14: 16/24    Time  8    Period  Weeks    Status  New    Target Date  10/30/19       PT LONG TERM GOAL #4   Title  Patient will increase BLE gross strength to 4+/5 as to improve functional strength for independent gait, increased standing tolerance and increased ADL ability.    Baseline  1/14: R 3+/5, L 4-/5    Time  8    Period  Weeks    Status  New    Target Date  10/30/19            Plan - 09/17/19 1146    Clinical Impression Statement  Patient demonstrates wide base of support and decreased dynamic standing balance, though improvements seen in her ability to maintain center of gravity during dynamic standing balance activities. Patient will continue to benefit from skilled therapy in order to improve dynamic standing balance and endurance.    Personal Factors and Comorbidities  Age;Comorbidity 3+;Past/Current Experience;Social Background;Time since onset of injury/illness/exacerbation    Comorbidities  A fib, MI s/p stent placement, lymphoma, osteoporosis, ASCVD, atherosclerosis, atrophic vaginitis, CHF, HTN, fibrocystic disease of breast, GERD, glaucoma, HLD, colonic polyps, HLD, hypothyroidism, migraine, stress incontinence, polyneuropathy    Examination-Activity Limitations  Bend;Caring for Others;Carry;Continence;Dressing;Stairs;Squat;Sit;Reach Overhead;Locomotion Level;Lift;Stand;Toileting    Examination-Participation Restrictions  Church;Cleaning;Community Activity;Interpersonal Relationship;Laundry;Volunteer;Shop;Meal Prep;Yard Work    [employer] officer  Evolving/Moderate complexity    Rehab Potential  Good    PT Frequency  2x / week    PT Duration  8 weeks    PT Treatment/Interventions  ADLs/Self Care Home Management;Aquatic Therapy;Biofeedback;Canalith Repostioning;Cryotherapy;Electrical Stimulation;Moist Heat;Traction;Ultrasound;DME Instruction;Gait training;Stair training;Functional mobility training;Therapeutic activities;Therapeutic exercise;Balance training;Neuromuscular re-education;Patient/family education;Manual techniques;Passive  range of motion;Dry needling;Energy conservation;Taping;Vestibular    PT Next Visit Plan  give HEP, single limb strength and stability  PT Home Exercise Plan  give next session    Consulted and Agree with Plan of Care  Patient       Patient will benefit from skilled therapeutic intervention in order to improve the following deficits and impairments:  Abnormal gait, Cardiopulmonary status limiting activity, Decreased activity tolerance, Decreased balance, Decreased endurance, Difficulty walking, Decreased strength, Postural dysfunction, Improper body mechanics  Visit Diagnosis: Unsteadiness on feet  Other abnormalities of gait and mobility  Muscle weakness (generalized)     Problem List Patient Active Problem List   Diagnosis Date Noted  . 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 H/O neoplasm 01/04/2015  . Arterial vascular disease 12/30/2013  . BP (high blood pressure) 12/30/2013  . Adult hypothyroidism 12/30/2013  . OP (osteoporosis) 12/30/2013  . Avitaminosis D 12/30/2013  . Atrial fibrillation ([place]) 12/30/2013  . HLD (hyperlipidemia) 12/30/2013  . Preoperative cardiovascular examination 04/07/2013  . Hyperlipidemia 04/07/2013  . A-fib ([place])   . Coronary artery disease   . Chronic systolic heart failure [facility])     Alanson Puls, [place] DPT 09/17/2019, 11:47 AM  [place] MAIN [facility] SERVICES [address] [place], [place], [address] Phone: [phone]   Fax:  [phone]  Name: Jeanne Pittman MRN: [id number] Date of Birth: 1934/12/04

## 2019-09-22 ENCOUNTER — Ambulatory Visit: Payer: Medicare Other | Attending: Neurology | Admitting: Physical Therapy

## 2019-09-22 ENCOUNTER — Encounter: Payer: Self-pay | Admitting: Physical Therapy

## 2019-09-22 ENCOUNTER — Other Ambulatory Visit: Payer: Self-pay

## 2019-09-22 DIAGNOSIS — M6281 Muscle weakness (generalized): Secondary | ICD-10-CM

## 2019-09-22 DIAGNOSIS — R2689 Other abnormalities of gait and mobility: Secondary | ICD-10-CM | POA: Diagnosis present

## 2019-09-22 DIAGNOSIS — R2681 Unsteadiness on feet: Secondary | ICD-10-CM | POA: Diagnosis present

## 2019-09-22 NOTE — Therapy (Addendum)
[place] MAIN [facility] SERVICES [address] [place], [place], [address] Phone: [phone]   Fax:  [phone]  Physical Therapy Treatment  Patient Details  Name: ESMAE HURTS MRN: [id number] Date of Birth: 06-12-1935 Referring Provider (PT): Jennings Books   Encounter Date: 09/22/2019  PT End of Session - 09/22/19 1106    Visit Number  6    Number of Visits  16    Date for PT Re-Evaluation  10/30/19    Authorization Type  1/10 eval 09/04/19    PT Start Time  1102    PT Stop Time  1145    PT Time Calculation (min)  43 min    Equipment Utilized During Treatment  Gait belt    Activity Tolerance  Patient tolerated treatment well    Behavior During Therapy  WFL for tasks assessed/performed       Past Medical History:  Diagnosis Date  . A-fib ([place])    07/2012: A. fib with RVR in the setting of myocardial infarction. Converted to sinus rhythm with amiodarone. No recurrence.  . Anxiety   . Atrophic vaginitis   . CHF (congestive heart failure) ([place])   . Chronic combined systolic (congestive) and diastolic (congestive) heart failure ([place])    a. EF was 25-35% post MI but improved to 45-50% in 04/2013; b. 03/2017 Echo: EF 40-45%, mod focal/basal hypertrophy of septum. Sev mid-apicalanteroseptal, ant, and apical HK. Gr1 DD, mild AI/MR, mod TR, PASP 79mHg.  . Colon adenomas   . Coronary artery disease    a. 99991111 STEMI complicated by cardiogenic shock. Cath/PCI: LAD 168m (DESx2), RCA 95p(DES). EF 25%; b. 03/2017 MV: EF 57%, fixed apical, periapical, mid to distal anteroseptal defect. No ischemia.  . Eczema   . Fibrocystic breast disease   . Gastritis   . GERD (gastroesophageal reflux disease)   . Glaucoma   . Headache   . History of gastritis   . Hyperlipidemia   . Hypertension   . Hypothyroidism   . Insomnia   . Ischemic cardiomyopathy    a. 07/2012 EF 25-35% following MI-->Improved to 45-50%;  b. 03/2017 Echo: EF 40-45%.  [name] Lymphoma ([place])    . Myocardial infarction ([place]) 08/18/2014  . Non Hodgkin's lymphoma ([place]) 2005   reoccurance 2007 and 2012  . Osteoarthritis   . Osteoporosis   . Polyneuropathy   . Polyposis of colon   . Restless leg syndrome   . Urinary, incontinence, stress female     Past Surgical History:  Procedure Laterality Date  . ABDOMINAL HYSTERECTOMY  1956  . APPENDECTOMY    . AXILLARY LYMPH NODE BIOPSY Left 03/18/2015   Procedure: AXILLARY LYMPH NODE BIOPSY;  Surgeon: [name], MD;  Location: [name];  Service: General;  Laterality: Left;  . CARDIAC CATHETERIZATION  08/19/2012   [name]; [place]  . COLONOSCOPY    . COLONOSCOPY WITH PROPOFOL N/A 03/02/2016   Procedure: COLONOSCOPY WITH PROPOFOL;  Surgeon: Manya Silvas, MD;  Location: [facility] ENDOSCOPY;  Service: Endoscopy;  Laterality: N/A;  . COLONOSCOPY WITH PROPOFOL N/A 09/26/2017   Procedure: COLONOSCOPY WITH PROPOFOL;  Surgeon: Manya Silvas, MD;  Location: [facility] ENDOSCOPY;  Service: Endoscopy;  Laterality: N/A;  . CORONARY ANGIOPLASTY WITH STENT PLACEMENT     x3 stents  . CORONARY ANGIOPLASTY WITH STENT PLACEMENT    . CORONARY ARTERY BYPASS GRAFT    . ESOPHAGOGASTRODUODENOSCOPY (EGD) WITH PROPOFOL N/A 03/02/2016   Procedure: ESOPHAGOGASTRODUODENOSCOPY (EGD) WITH PROPOFOL;  Surgeon: Gavin Pound  Vira Agar, MD;  Location: [place] ENDOSCOPY;  Service: Endoscopy;  Laterality: N/A;  . ESOPHAGOGASTRODUODENOSCOPY (EGD) WITH PROPOFOL N/A 04/14/2019   Procedure: ESOPHAGOGASTRODUODENOSCOPY (EGD) WITH PROPOFOL;  Surgeon: Toledo, Benay Pike, MD;  Location: [name] ENDOSCOPY;  Service: Gastroenterology;  Laterality: N/A;  . LYMPH NODE BIOPSY Right 07-24-11   Dr Bary Castilla  . MOHS SURGERY     bilateral shoulders  . PTCA    . skin cancer removal    . TOTAL ABDOMINAL HYSTERECTOMY W/ BILATERAL SALPINGOOPHORECTOMY      There were no vitals filed for this visit.  Subjective Assessment - 09/22/19 1104    Subjective  Patient has no pain today.    Pertinent History   Patient is a pleasant 85 year old female who presents to physical therapy for imbalance. Per previous documentation patient has been having unsteadiness for several months and burning in bilateral LE's and feet since 2015 after having a heart attack. PMH includes A fib, MI s/p stent placement, lymphoma, osteoporosis, ASCVD, atherosclerosis, atrophic vaginitis, CHF, HTN, fibrocystic disease of breast, GERD, glaucoma, HLD, colonic polyps, HLD, hypothyroidism, migraine, stress incontinence, polyneuropathy. It has been since October since she saw the physician for right sided weakness. Is having some pain in right hip, buttocks, leg, and foot. Foot is stabbing when first gets up and aches in the back of knee. Usually stumbles around corners.    Limitations  Lifting;Standing;Walking;House hold activities    How long can you sit comfortably?  all day    How long can you stand comfortably?  gets fatigued    How long can you walk comfortably?  2 blocks    Patient Stated Goals  to walk further    Currently in Pain?  Yes    Pain Score  2     Pain Location  Shoulder    Pain Orientation  Left    Pain Descriptors / Indicators  Aching    Pain Onset  In the past 7 days    Multiple Pain Sites  No       Treatment: Neuromuscular training: side stepping left and right in parallel bars 10 feet x 3 standing on blue foam with head turns x 1 min  Standing feet together on blue foam with head turns x 1 min step ups from floor to 6 inch stool x 20 bilateral Step ups from blue foam to 6 inch stool x 20 BLE marching in parallel bars x 20  Pt educated throughout session about proper posture and technique with exercises. Improved exercise technique, movement at target joints, use of target muscles after min to mod verbal, visual, tactile cues. CGA and Min to mod verbal cues used throughout with increased in postural sway and LOB most seen with narrow base of support and while on uneven  surfaces.                       PT Education - 09/22/19 1105    Education Details  HEP    Person(s) Educated  Patient    Methods  Explanation    Comprehension  Verbalized understanding;Verbal cues required;Need further instruction       PT Short Term Goals - 09/05/19 1242      PT SHORT TERM GOAL #1   Title  Patient will be independent in home exercise program to improve strength/mobility for better functional independence with ADLs.    Baseline  1/14: give next session    Time  2    Period  Weeks    Status  New    Target Date  09/19/19        PT Long Term Goals - 09/05/19 1243      PT LONG TERM GOAL #1   Title  Patient will increase Berg Balance score by > 6 points (49/56)  to demonstrate decreased fall risk during functional activities.    Baseline  1/14: 43/56    Time  8    Period  Weeks    Status  New    Target Date  10/30/19      PT LONG TERM GOAL #2   Title  Patient will increase ABC scale score >80% to demonstrate better functional mobility and better confidence with ADLs    Baseline  1/14/ 49.4%    Time  8    Period  Weeks    Status  New    Target Date  10/30/19      PT LONG TERM GOAL #3   Title  Patient will increase dynamic gait index score to >19/24 as to demonstrate reduced fall risk and improved dynamic gait balance for better safety with community/home ambulation.    Baseline  1/14: 16/24    Time  8    Period  Weeks    Status  New    Target Date  10/30/19      PT LONG TERM GOAL #4   Title  Patient will increase BLE gross strength to 4+/5 as to improve functional strength for independent gait, increased standing tolerance and increased ADL ability.    Baseline  1/14: R 3+/5, L 4-/5    Time  8    Period  Weeks    Status  New    Target Date  10/30/19            Plan - 09/22/19 1106    Clinical Impression Statement  Pt demonstrates increased postural sway when standing on uneven surface and requires // bars to steady, and  demonstrates fatigue at end of set of exercises focused on strength and endurance.  Patient will continue to benefit from skilled PT for improved balance and strength   Personal Factors and Comorbidities  Age;Comorbidity 3+;Past/Current Experience;Social Background;Time since onset of injury/illness/exacerbation    Comorbidities  A fib, MI s/p stent placement, lymphoma, osteoporosis, ASCVD, atherosclerosis, atrophic vaginitis, CHF, HTN, fibrocystic disease of breast, GERD, glaucoma, HLD, colonic polyps, HLD, hypothyroidism, migraine, stress incontinence, polyneuropathy    Examination-Activity Limitations  Bend;Caring for Others;Carry;Continence;Dressing;Stairs;Squat;Sit;Reach Overhead;Locomotion Level;Lift;Stand;Toileting    Examination-Participation Restrictions  Church;Cleaning;Community Activity;Interpersonal Relationship;Laundry;Volunteer;Shop;Meal Prep;Yard Work    [employer] officer  Evolving/Moderate complexity    Rehab Potential  Good    PT Frequency  2x / week    PT Duration  8 weeks    PT Treatment/Interventions  ADLs/Self Care Home Management;Aquatic Therapy;Biofeedback;Canalith Repostioning;Cryotherapy;Electrical Stimulation;Moist Heat;Traction;Ultrasound;DME Instruction;Gait training;Stair training;Functional mobility training;Therapeutic activities;Therapeutic exercise;Balance training;Neuromuscular re-education;Patient/family education;Manual techniques;Passive range of motion;Dry needling;Energy conservation;Taping;Vestibular    PT Next Visit Plan  give HEP, single limb strength and stability    PT Home Exercise Plan  give next session    Consulted and Agree with Plan of Care  Patient       Patient will benefit from skilled therapeutic intervention in order to improve the following deficits and impairments:  Abnormal gait, Cardiopulmonary status limiting activity, Decreased activity tolerance, Decreased balance, Decreased endurance, Difficulty walking, Decreased  strength, Postural dysfunction, Improper body mechanics  Visit Diagnosis: Unsteadiness on feet  Other abnormalities of gait and mobility  Muscle weakness (generalized)     Problem List Patient Active Problem List   Diagnosis Date Noted  . 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 H/O neoplasm 01/04/2015  . Arterial vascular disease 12/30/2013  . BP (high blood pressure) 12/30/2013  . Adult hypothyroidism 12/30/2013  . OP (osteoporosis) 12/30/2013  . Avitaminosis D 12/30/2013  . Atrial fibrillation ([place]) 12/30/2013  . HLD (hyperlipidemia) 12/30/2013  . Preoperative cardiovascular examination 04/07/2013  . Hyperlipidemia 04/07/2013  . A-fib ([place])   . Coronary artery disease   . Chronic systolic heart failure [facility])     Alanson Puls, [place] DPT 09/22/2019, 11:07 AM  [place] MAIN [facility] SERVICES [address] [place], [place], [address] Phone: [phone]   Fax:  [phone]  Name: RONNETTA FAUNCE MRN: [id number] Date of Birth: 1935-08-16

## 2019-09-24 ENCOUNTER — Ambulatory Visit: Payer: Medicare Other | Admitting: Physical Therapy

## 2019-09-24 ENCOUNTER — Encounter: Payer: Self-pay | Admitting: Physical Therapy

## 2019-09-24 ENCOUNTER — Other Ambulatory Visit: Payer: Self-pay

## 2019-09-24 DIAGNOSIS — R2689 Other abnormalities of gait and mobility: Secondary | ICD-10-CM

## 2019-09-24 DIAGNOSIS — M6281 Muscle weakness (generalized): Secondary | ICD-10-CM

## 2019-09-24 DIAGNOSIS — R2681 Unsteadiness on feet: Secondary | ICD-10-CM | POA: Diagnosis not present

## 2019-09-24 NOTE — Therapy (Signed)
[place] MAIN [facility] SERVICES [address] [place], [place], [address] Phone: [phone]   Fax:  [phone]  Physical Therapy Treatment  Patient Details  Name: ANJALEE NETTO MRN: [id number] Date of Birth: [birth year] Referring Provider (PT): Jennings Books   Encounter Date: 09/24/2019  PT End of Session - 09/24/19 1147    Visit Number  7    Number of Visits  16    Date for PT Re-Evaluation  10/30/19    Authorization Type  1/10 eval 09/04/19    PT Start Time  1140    PT Stop Time  1220    PT Time Calculation (min)  40 min    Equipment Utilized During Treatment  Gait belt    Activity Tolerance  Patient tolerated treatment well    Behavior During Therapy  WFL for tasks assessed/performed       Past Medical History:  Diagnosis Date  . A-fib ([place])    07/2012: A. fib with RVR in the setting of myocardial infarction. Converted to sinus rhythm with amiodarone. No recurrence.  . Anxiety   . Atrophic vaginitis   . CHF (congestive heart failure) ([place])   . Chronic combined systolic (congestive) and diastolic (congestive) heart failure ([place])    a. EF was 25-35% post MI but improved to 45-50% in 04/2013; b. 03/2017 Echo: EF 40-45%, mod focal/basal hypertrophy of septum. Sev mid-apicalanteroseptal, ant, and apical HK. Gr1 DD, mild AI/MR, mod TR, PASP 60mHg.  . Colon adenomas   . Coronary artery disease    a. 99991111 STEMI complicated by cardiogenic shock. Cath/PCI: LAD 130m (DESx2), RCA 95p(DES). EF 25%; b. 03/2017 MV: EF 57%, fixed apical, periapical, mid to distal anteroseptal defect. No ischemia.  . Eczema   . Fibrocystic breast disease   . Gastritis   . GERD (gastroesophageal reflux disease)   . Glaucoma   . Headache   . History of gastritis   . Hyperlipidemia   . Hypertension   . Hypothyroidism   . Insomnia   . Ischemic cardiomyopathy    a. 07/2012 EF 25-35% following MI-->Improved to 45-50%;  b. 03/2017 Echo: EF 40-45%.  [name] Lymphoma ([place])    . Myocardial infarction ([place]) 08/18/2014  . Non Hodgkin's lymphoma ([place]) 2005   reoccurance 2007 and 2012  . Osteoarthritis   . Osteoporosis   . Polyneuropathy   . Polyposis of colon   . Restless leg syndrome   . Urinary, incontinence, stress female     Past Surgical History:  Procedure Laterality Date  . ABDOMINAL HYSTERECTOMY  1956  . APPENDECTOMY    . AXILLARY LYMPH NODE BIOPSY Left 03/18/2015   Procedure: AXILLARY LYMPH NODE BIOPSY;  Surgeon: [name], MD;  Location: [name];  Service: General;  Laterality: Left;  . CARDIAC CATHETERIZATION  08/19/2012   [name]; [place]  . COLONOSCOPY    . COLONOSCOPY WITH PROPOFOL N/A 03/02/2016   Procedure: COLONOSCOPY WITH PROPOFOL;  Surgeon: Manya Silvas, MD;  Location: [facility] ENDOSCOPY;  Service: Endoscopy;  Laterality: N/A;  . COLONOSCOPY WITH PROPOFOL N/A 09/26/2017   Procedure: COLONOSCOPY WITH PROPOFOL;  Surgeon: Manya Silvas, MD;  Location: [facility] ENDOSCOPY;  Service: Endoscopy;  Laterality: N/A;  . CORONARY ANGIOPLASTY WITH STENT PLACEMENT     x3 stents  . CORONARY ANGIOPLASTY WITH STENT PLACEMENT    . CORONARY ARTERY BYPASS GRAFT    . ESOPHAGOGASTRODUODENOSCOPY (EGD) WITH PROPOFOL N/A 03/02/2016   Procedure: ESOPHAGOGASTRODUODENOSCOPY (EGD) WITH PROPOFOL;  Surgeon: Gavin Pound  Vira Agar, MD;  Location: [facility] ENDOSCOPY;  Service: Endoscopy;  Laterality: N/A;  . ESOPHAGOGASTRODUODENOSCOPY (EGD) WITH PROPOFOL N/A 04/14/2019   Procedure: ESOPHAGOGASTRODUODENOSCOPY (EGD) WITH PROPOFOL;  Surgeon: Toledo, Benay Pike, MD;  Location: [name] ENDOSCOPY;  Service: Gastroenterology;  Laterality: N/A;  . LYMPH NODE BIOPSY Right 07-24-11   Dr Bary Castilla  . MOHS SURGERY     bilateral shoulders  . PTCA    . skin cancer removal    . TOTAL ABDOMINAL HYSTERECTOMY W/ BILATERAL SALPINGOOPHORECTOMY      There were no vitals filed for this visit.  Subjective Assessment - 09/24/19 1147    Subjective  Patient has no pain today.    Pertinent History   Patient is a pleasant 85 year old female who presents to physical therapy for imbalance. Per previous documentation patient has been having unsteadiness for several months and burning in bilateral LE's and feet since 2015 after having a heart attack. PMH includes A fib, MI s/p stent placement, lymphoma, osteoporosis, ASCVD, atherosclerosis, atrophic vaginitis, CHF, HTN, fibrocystic disease of breast, GERD, glaucoma, HLD, colonic polyps, HLD, hypothyroidism, migraine, stress incontinence, polyneuropathy. It has been since October since she saw the physician for right sided weakness. Is having some pain in right hip, buttocks, leg, and foot. Foot is stabbing when first gets up and aches in the back of knee. Usually stumbles around corners.    Limitations  Lifting;Standing;Walking;House hold activities    How long can you sit comfortably?  all day    How long can you stand comfortably?  gets fatigued    How long can you walk comfortably?  2 blocks    Patient Stated Goals  to walk further    Currently in Pain?  No/denies    Pain Score  0-No pain    Pain Onset  In the past 7 days       Therapeutic exercise: Nu-step  x 5 mins L 4   Supine: SLR x 15 BLE, 2 lbs Hookling marching x 15 , 2 lbs Hooklying abd/ER x 15 , RTB Bridging x 15 SAQ x 15 BLE, 2 lbs  Sidelying: Hip abd x 15 , BLE  Standing Hip flexion marches with 2# ankle weights x 10 bilateral; Hip abduction with 2# x 10 bilateral Hip extension with 2# x 10 bilateral    Patient needs occasional verbal cueing to improve posture and cueing to correctly perform exercises slowly, holding at end of range to increase motor firing of desired muscle to encourage fatigue.                         PT Education - 09/24/19 1147    Education Details  HEP    Person(s) Educated  Patient    Methods  Explanation    Comprehension  Verbalized understanding;Verbal cues required;Tactile cues required;Need further instruction        PT Short Term Goals - 09/05/19 1242      PT SHORT TERM GOAL #1   Title  Patient will be independent in home exercise program to improve strength/mobility for better functional independence with ADLs.    Baseline  1/14: give next session    Time  2    Period  Weeks    Status  New    Target Date  09/19/19        PT Long Term Goals - 09/05/19 1243      PT LONG TERM GOAL #1   Title  Patient will increase  Berg Balance score by > 6 points (49/56)  to demonstrate decreased fall risk during functional activities.    Baseline  1/14: 43/56    Time  8    Period  Weeks    Status  New    Target Date  10/30/19      PT LONG TERM GOAL #2   Title  Patient will increase ABC scale score >80% to demonstrate better functional mobility and better confidence with ADLs    Baseline  1/14/ 49.4%    Time  8    Period  Weeks    Status  New    Target Date  10/30/19      PT LONG TERM GOAL #3   Title  Patient will increase dynamic gait index score to >19/24 as to demonstrate reduced fall risk and improved dynamic gait balance for better safety with community/home ambulation.    Baseline  1/14: 16/24    Time  8    Period  Weeks    Status  New    Target Date  10/30/19      PT LONG TERM GOAL #4   Title  Patient will increase BLE gross strength to 4+/5 as to improve functional strength for independent gait, increased standing tolerance and increased ADL ability.    Baseline  1/14: R 3+/5, L 4-/5    Time  8    Period  Weeks    Status  New    Target Date  10/30/19            Plan - 09/24/19 1147    Clinical Impression Statement   Pt was able to perform all exercises today with CGA.[name] Pt was able to perform all balance and strength exercises, demonstrating improvements in LE strength and stability.  Pt was able to complete dynamic balance exercises, showing ability to stand on even surfaces with min assist and improve postural reactions to correct self during activities.  Pt requires verbal,  visual and tactile cues during exercise in order to complete tasks with proper form and technique, as well as to stay on task.  Pt would continue to benefit from skilled PT services in order to further strengthen LE's, improve static and dynamic balance, and improve coordination in order to increase functional mobility and decrease risk of falls    Personal Factors and Comorbidities  Age;Comorbidity 3+;Past/Current Experience;Social Background;Time since onset of injury/illness/exacerbation    Comorbidities  A fib, MI s/p stent placement, lymphoma, osteoporosis, ASCVD, atherosclerosis, atrophic vaginitis, CHF, HTN, fibrocystic disease of breast, GERD, glaucoma, HLD, colonic polyps, HLD, hypothyroidism, migraine, stress incontinence, polyneuropathy    Examination-Activity Limitations  Bend;Caring for Others;Carry;Continence;Dressing;Stairs;Squat;Sit;Reach Overhead;Locomotion Level;Lift;Stand;Toileting    Examination-Participation Restrictions  Church;Cleaning;Community Activity;Interpersonal Relationship;Laundry;Volunteer;Shop;Meal Prep;Yard Work    [employer] officer  Evolving/Moderate complexity    Rehab Potential  Good    PT Frequency  2x / week    PT Duration  8 weeks    PT Treatment/Interventions  ADLs/Self Care Home Management;Aquatic Therapy;Biofeedback;Canalith Repostioning;Cryotherapy;Electrical Stimulation;Moist Heat;Traction;Ultrasound;DME Instruction;Gait training;Stair training;Functional mobility training;Therapeutic activities;Therapeutic exercise;Balance training;Neuromuscular re-education;Patient/family education;Manual techniques;Passive range of motion;Dry needling;Energy conservation;Taping;Vestibular    PT Next Visit Plan  give HEP, single limb strength and stability    PT Home Exercise Plan  give next session    Consulted and Agree with Plan of Care  Patient       Patient will benefit from skilled therapeutic intervention in order to improve the following deficits  and impairments:  Abnormal gait, Cardiopulmonary status limiting activity,  Decreased activity tolerance, Decreased balance, Decreased endurance, Difficulty walking, Decreased strength, Postural dysfunction, Improper body mechanics  Visit Diagnosis: Unsteadiness on feet  Other abnormalities of gait and mobility  Muscle weakness (generalized)     Problem List Patient Active Problem List   Diagnosis Date Noted  . 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 H/O neoplasm 01/04/2015  . Arterial vascular disease 12/30/2013  . BP (high blood pressure) 12/30/2013  . Adult hypothyroidism 12/30/2013  . OP (osteoporosis) 12/30/2013  . Avitaminosis D 12/30/2013  . Atrial fibrillation ([place]) 12/30/2013  . HLD (hyperlipidemia) 12/30/2013  . Preoperative cardiovascular examination 04/07/2013  . Hyperlipidemia 04/07/2013  . A-fib ([place])   . Coronary artery disease   . Chronic systolic heart failure [facility])     Alanson Puls, [place] DPT 09/24/2019, 11:48 AM  [place] MAIN [facility] SERVICES [address] [place], [place], [address] Phone: [phone]   Fax:  [phone]  Name: AEVAH SODERMAN MRN: [id number] Date of Birth: 11-Apr-1935

## 2019-09-29 ENCOUNTER — Ambulatory Visit: Payer: Medicare Other | Admitting: Physical Therapy

## 2019-09-29 ENCOUNTER — Encounter: Payer: Self-pay | Admitting: Physical Therapy

## 2019-09-29 ENCOUNTER — Other Ambulatory Visit: Payer: Self-pay

## 2019-09-29 DIAGNOSIS — R2681 Unsteadiness on feet: Secondary | ICD-10-CM | POA: Diagnosis not present

## 2019-09-29 DIAGNOSIS — M6281 Muscle weakness (generalized): Secondary | ICD-10-CM

## 2019-09-29 DIAGNOSIS — R2689 Other abnormalities of gait and mobility: Secondary | ICD-10-CM

## 2019-09-29 NOTE — Therapy (Signed)
[place] MAIN [facility] SERVICES [address] [name], [place], [address] Phone: [phone]   Fax:  [phone]  Physical Therapy Treatment  Patient Details  Name: Jeanne Pittman MRN: [id number] Date of Birth: [birth year] Referring Provider (PT): Jennings Books   Encounter Date: 09/29/2019  PT End of Session - 09/29/19 1421    Visit Number  8    Number of Visits  16    Date for PT Re-Evaluation  10/30/19    Authorization Type  1/10 eval 09/04/19    PT Start Time  1145    PT Stop Time  1225    PT Time Calculation (min)  40 min    Equipment Utilized During Treatment  Gait belt    Activity Tolerance  Patient tolerated treatment well    Behavior During Therapy  WFL for tasks assessed/performed       Past Medical History:  Diagnosis Date  . A-fib ([place])    07/2012: A. fib with RVR in the setting of myocardial infarction. Converted to sinus rhythm with amiodarone. No recurrence.  . Anxiety   . Atrophic vaginitis   . CHF (congestive heart failure) ([place])   . Chronic combined systolic (congestive) and diastolic (congestive) heart failure ([place])    a. EF was 25-35% post MI but improved to 45-50% in 04/2013; b. 03/2017 Echo: EF 40-45%, mod focal/basal hypertrophy of septum. Sev mid-apicalanteroseptal, ant, and apical HK. Gr1 DD, mild AI/MR, mod TR, PASP 26mHg.  . Colon adenomas   . Coronary artery disease    a. 99991111 STEMI complicated by cardiogenic shock. Cath/PCI: LAD 187m (DESx2), RCA 95p(DES). EF 25%; b. 03/2017 MV: EF 57%, fixed apical, periapical, mid to distal anteroseptal defect. No ischemia.  . Eczema   . Fibrocystic breast disease   . Gastritis   . GERD (gastroesophageal reflux disease)   . Glaucoma   . Headache   . History of gastritis   . Hyperlipidemia   . Hypertension   . Hypothyroidism   . Insomnia   . Ischemic cardiomyopathy    a. 07/2012 EF 25-35% following MI-->Improved to 45-50%;  b. 03/2017 Echo: EF 40-45%.  [name] Lymphoma ([place])    . Myocardial infarction ([place]) 08/18/2014  . Non Hodgkin's lymphoma ([place]) 2005   reoccurance 2007 and 2012  . Osteoarthritis   . Osteoporosis   . Polyneuropathy   . Polyposis of colon   . Restless leg syndrome   . Urinary, incontinence, stress female     Past Surgical History:  Procedure Laterality Date  . ABDOMINAL HYSTERECTOMY  1956  . APPENDECTOMY    . AXILLARY LYMPH NODE BIOPSY Left 03/18/2015   Procedure: AXILLARY LYMPH NODE BIOPSY;  Surgeon: [name], MD;  Location: [name];  Service: General;  Laterality: Left;  . CARDIAC CATHETERIZATION  08/19/2012   [name]; [place]  . COLONOSCOPY    . COLONOSCOPY WITH PROPOFOL N/A 03/02/2016   Procedure: COLONOSCOPY WITH PROPOFOL;  Surgeon: Manya Silvas, MD;  Location: [facility] ENDOSCOPY;  Service: Endoscopy;  Laterality: N/A;  . COLONOSCOPY WITH PROPOFOL N/A 09/26/2017   Procedure: COLONOSCOPY WITH PROPOFOL;  Surgeon: Manya Silvas, MD;  Location: [facility] ENDOSCOPY;  Service: Endoscopy;  Laterality: N/A;  . CORONARY ANGIOPLASTY WITH STENT PLACEMENT     x3 stents  . CORONARY ANGIOPLASTY WITH STENT PLACEMENT    . CORONARY ARTERY BYPASS GRAFT    . ESOPHAGOGASTRODUODENOSCOPY (EGD) WITH PROPOFOL N/A 03/02/2016   Procedure: ESOPHAGOGASTRODUODENOSCOPY (EGD) WITH PROPOFOL;  Surgeon: Gavin Pound  Vira Agar, MD;  Location: [place] ENDOSCOPY;  Service: Endoscopy;  Laterality: N/A;  . ESOPHAGOGASTRODUODENOSCOPY (EGD) WITH PROPOFOL N/A 04/14/2019   Procedure: ESOPHAGOGASTRODUODENOSCOPY (EGD) WITH PROPOFOL;  Surgeon: Toledo, Benay Pike, MD;  Location: [name] ENDOSCOPY;  Service: Gastroenterology;  Laterality: N/A;  . LYMPH NODE BIOPSY Right 07-24-11   Dr Bary Castilla  . MOHS SURGERY     bilateral shoulders  . PTCA    . skin cancer removal    . TOTAL ABDOMINAL HYSTERECTOMY W/ BILATERAL SALPINGOOPHORECTOMY      There were no vitals filed for this visit.  Subjective Assessment - 09/29/19 1419    Subjective  Patient has no pain today. She reports that she feels  tired.    Pertinent History  Patient is a pleasant 85 year old female who presents to physical therapy for imbalance. Per previous documentation patient has been having unsteadiness for several months and burning in bilateral LE's and feet since 2015 after having a heart attack. PMH includes A fib, MI s/p stent placement, lymphoma, osteoporosis, ASCVD, atherosclerosis, atrophic vaginitis, CHF, HTN, fibrocystic disease of breast, GERD, glaucoma, HLD, colonic polyps, HLD, hypothyroidism, migraine, stress incontinence, polyneuropathy. It has been since October since she saw the physician for right sided weakness. Is having some pain in right hip, buttocks, leg, and foot. Foot is stabbing when first gets up and aches in the back of knee. Usually stumbles around corners.    Limitations  Lifting;Standing;Walking;House hold activities    How long can you sit comfortably?  all day    How long can you stand comfortably?  gets fatigued    How long can you walk comfortably?  2 blocks    Patient Stated Goals  to walk further    Currently in Pain?  No/denies    Pain Score  0-No pain    Pain Onset  In the past 7 days         Neuromuscular Re-education   Tandem gait  without UE support x 2 lengths Side stepping  without UE support x 2 lengths Heel/toe raises without UE support 3s hold x 10 each 1/2 foam roll balance with flat side up 30s x 2 reps 1/2 foam roll balance with flat side down 30s x 2 reps 1/2 foam roll tandem balance alternating forward LE 30s x 2 each LE forward Lateral side steps from foam to 6 inch stool left and right x 15 Backwards stepping from foam to 6 inch stool x 15        Pt educated throughout session about proper posture and technique with exercises. Improved exercise technique, movement at target joints, use of target muscles after min to mod verbal, visual, tactile cues. CGA and Min to mod verbal cues used throughout with increased in postural sway and LOB most seen with  narrow base of support and while on uneven surfaces. Continues to have balance deficits typical with diagnosis.                   PT Education - 09/29/19 1421    Education Details  HEP    Person(s) Educated  Patient    Methods  Explanation    Comprehension  Verbalized understanding;Returned demonstration;Need further instruction       PT Short Term Goals - 09/05/19 1242      PT SHORT TERM GOAL #1   Title  Patient will be independent in home exercise program to improve strength/mobility for better functional independence with ADLs.    Baseline  1/14:  give next session    Time  2    Period  Weeks    Status  New    Target Date  09/19/19        PT Long Term Goals - 09/05/19 1243      PT LONG TERM GOAL #1   Title  Patient will increase Berg Balance score by > 6 points (49/56)  to demonstrate decreased fall risk during functional activities.    Baseline  1/14: 43/56    Time  8    Period  Weeks    Status  New    Target Date  10/30/19      PT LONG TERM GOAL #2   Title  Patient will increase ABC scale score >80% to demonstrate better functional mobility and better confidence with ADLs    Baseline  1/14/ 49.4%    Time  8    Period  Weeks    Status  New    Target Date  10/30/19      PT LONG TERM GOAL #3   Title  Patient will increase dynamic gait index score to >19/24 as to demonstrate reduced fall risk and improved dynamic gait balance for better safety with community/home ambulation.    Baseline  1/14: 16/24    Time  8    Period  Weeks    Status  New    Target Date  10/30/19      PT LONG TERM GOAL #4   Title  Patient will increase BLE gross strength to 4+/5 as to improve functional strength for independent gait, increased standing tolerance and increased ADL ability.    Baseline  1/14: R 3+/5, L 4-/5    Time  8    Period  Weeks    Status  New    Target Date  10/30/19            Plan - 09/29/19 1422    Clinical Impression Statement  Patient  instructed in intermediate balance/strengthening exercise. Patient fatigues quickly requiring short rest breaks in between intermediate exercise. Patient instructed in advanced strengthening with increased repetition/resistance. Patient requires CGA for intermediate balance exercise especially with less rail assist. Patient would benefit from additional skilled PT intervention to improve balance/gait safety and reduce fall risk.   Personal Factors and Comorbidities  Age;Comorbidity 3+;Past/Current Experience;Social Background;Time since onset of injury/illness/exacerbation    Comorbidities  A fib, MI s/p stent placement, lymphoma, osteoporosis, ASCVD, atherosclerosis, atrophic vaginitis, CHF, HTN, fibrocystic disease of breast, GERD, glaucoma, HLD, colonic polyps, HLD, hypothyroidism, migraine, stress incontinence, polyneuropathy    Examination-Activity Limitations  Bend;Caring for Others;Carry;Continence;Dressing;Stairs;Squat;Sit;Reach Overhead;Locomotion Level;Lift;Stand;Toileting    Examination-Participation Restrictions  Church;Cleaning;Community Activity;Interpersonal Relationship;Laundry;Volunteer;Shop;Meal Prep;Yard Work    [employer] officer  Evolving/Moderate complexity    Rehab Potential  Good    PT Frequency  2x / week    PT Duration  8 weeks    PT Treatment/Interventions  ADLs/Self Care Home Management;Aquatic Therapy;Biofeedback;Canalith Repostioning;Cryotherapy;Electrical Stimulation;Moist Heat;Traction;Ultrasound;DME Instruction;Gait training;Stair training;Functional mobility training;Therapeutic activities;Therapeutic exercise;Balance training;Neuromuscular re-education;Patient/family education;Manual techniques;Passive range of motion;Dry needling;Energy conservation;Taping;Vestibular    PT Next Visit Plan  give HEP, single limb strength and stability    PT Home Exercise Plan  give next session    Consulted and Agree with Plan of Care  Patient       Patient will  benefit from skilled therapeutic intervention in order to improve the following deficits and impairments:  Abnormal gait, Cardiopulmonary status limiting activity, Decreased activity tolerance, Decreased balance, Decreased endurance,  Difficulty walking, Decreased strength, Postural dysfunction, Improper body mechanics  Visit Diagnosis: Unsteadiness on feet  Other abnormalities of gait and mobility  Muscle weakness (generalized)     Problem List Patient Active Problem List   Diagnosis Date Noted  . 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 H/O neoplasm 01/04/2015  . Arterial vascular disease 12/30/2013  . BP (high blood pressure) 12/30/2013  . Adult hypothyroidism 12/30/2013  . OP (osteoporosis) 12/30/2013  . Avitaminosis D 12/30/2013  . Atrial fibrillation ([place]) 12/30/2013  . HLD (hyperlipidemia) 12/30/2013  . Preoperative cardiovascular examination 04/07/2013  . Hyperlipidemia 04/07/2013  . A-fib ([place])   . Coronary artery disease   . Chronic systolic heart failure [facility])     Alanson Puls, [place] DPT 09/29/2019, 2:22 PM  [place] MAIN [facility] SERVICES [address] [place], [place], [address] Phone: [phone]   Fax:  [phone]  Name: Jeanne Pittman MRN: [id number] Date of Birth: Nov 23, 1934

## 2019-10-02 ENCOUNTER — Other Ambulatory Visit: Payer: Self-pay

## 2019-10-02 ENCOUNTER — Ambulatory Visit: Payer: Medicare Other

## 2019-10-02 DIAGNOSIS — R2681 Unsteadiness on feet: Secondary | ICD-10-CM | POA: Diagnosis not present

## 2019-10-02 DIAGNOSIS — M6281 Muscle weakness (generalized): Secondary | ICD-10-CM

## 2019-10-02 DIAGNOSIS — R2689 Other abnormalities of gait and mobility: Secondary | ICD-10-CM

## 2019-10-02 NOTE — Therapy (Signed)
[place] MAIN [facility] SERVICES [address] [place], [place], [address] Phone: [phone]   Fax:  [phone]  Physical Therapy Treatment  Patient Details  Name: CLARRISSA SUNNY MRN: [id number] Date of Birth: 1935-02-02 Referring Provider (PT): Jennings Books   Encounter Date: 10/02/2019  PT End of Session - 10/02/19 1215    Visit Number  9    Number of Visits  16    Date for PT Re-Evaluation  10/30/19    Authorization Type  9/10 eval 09/04/19    PT Start Time  1115    PT Stop Time  1159    PT Time Calculation (min)  44 min    Equipment Utilized During Treatment  Gait belt    Activity Tolerance  Patient tolerated treatment well    Behavior During Therapy  WFL for tasks assessed/performed       Past Medical History:  Diagnosis Date  . A-fib ([place])    07/2012: A. fib with RVR in the setting of myocardial infarction. Converted to sinus rhythm with amiodarone. No recurrence.  . Anxiety   . Atrophic vaginitis   . CHF (congestive heart failure) ([place])   . Chronic combined systolic (congestive) and diastolic (congestive) heart failure ([place])    a. EF was 25-35% post MI but improved to 45-50% in 04/2013; b. 03/2017 Echo: EF 40-45%, mod focal/basal hypertrophy of septum. Sev mid-apicalanteroseptal, ant, and apical HK. Gr1 DD, mild AI/MR, mod TR, PASP 32mHg.  . Colon adenomas   . Coronary artery disease    a. [id number] STEMI complicated by cardiogenic shock. Cath/PCI: LAD 172m (DESx2), RCA 95p(DES). EF 25%; b. 03/2017 MV: EF 57%, fixed apical, periapical, mid to distal anteroseptal defect. No ischemia.  . Eczema   . Fibrocystic breast disease   . Gastritis   . GERD (gastroesophageal reflux disease)   . Glaucoma   . Headache   . History of gastritis   . Hyperlipidemia   . Hypertension   . Hypothyroidism   . Insomnia   . Ischemic cardiomyopathy    a. 07/2012 EF 25-35% following MI-->Improved to 45-50%;  b. 03/2017 Echo: EF 40-45%.  [name] Lymphoma ([place])    . Myocardial infarction ([place]) 08/18/2014  . Non Hodgkin's lymphoma ([place]) 2005   reoccurance 2007 and 2012  . Osteoarthritis   . Osteoporosis   . Polyneuropathy   . Polyposis of colon   . Restless leg syndrome   . Urinary, incontinence, stress female     Past Surgical History:  Procedure Laterality Date  . ABDOMINAL HYSTERECTOMY  1956  . APPENDECTOMY    . AXILLARY LYMPH NODE BIOPSY Left 03/18/2015   Procedure: AXILLARY LYMPH NODE BIOPSY;  Surgeon: [name], MD;  Location: [name];  Service: General;  Laterality: Left;  . CARDIAC CATHETERIZATION  08/19/2012   [name]; [place]  . COLONOSCOPY    . COLONOSCOPY WITH PROPOFOL N/A 03/02/2016   Procedure: COLONOSCOPY WITH PROPOFOL;  Surgeon: Manya Silvas, MD;  Location: [facility] ENDOSCOPY;  Service: Endoscopy;  Laterality: N/A;  . COLONOSCOPY WITH PROPOFOL N/A 09/26/2017   Procedure: COLONOSCOPY WITH PROPOFOL;  Surgeon: Manya Silvas, MD;  Location: [facility] ENDOSCOPY;  Service: Endoscopy;  Laterality: N/A;  . CORONARY ANGIOPLASTY WITH STENT PLACEMENT     x3 stents  . CORONARY ANGIOPLASTY WITH STENT PLACEMENT    . CORONARY ARTERY BYPASS GRAFT    . ESOPHAGOGASTRODUODENOSCOPY (EGD) WITH PROPOFOL N/A 03/02/2016   Procedure: ESOPHAGOGASTRODUODENOSCOPY (EGD) WITH PROPOFOL;  Surgeon: Gavin Pound  Vira Agar, MD;  Location: [facility] ENDOSCOPY;  Service: Endoscopy;  Laterality: N/A;  . ESOPHAGOGASTRODUODENOSCOPY (EGD) WITH PROPOFOL N/A 04/14/2019   Procedure: ESOPHAGOGASTRODUODENOSCOPY (EGD) WITH PROPOFOL;  Surgeon: Toledo, Benay Pike, MD;  Location: [name] ENDOSCOPY;  Service: Gastroenterology;  Laterality: N/A;  . LYMPH NODE BIOPSY Right 07-24-11   Dr Bary Castilla  . MOHS SURGERY     bilateral shoulders  . PTCA    . skin cancer removal    . TOTAL ABDOMINAL HYSTERECTOMY W/ BILATERAL SALPINGOOPHORECTOMY      There were no vitals filed for this visit.  Subjective Assessment - 10/02/19 1137    Subjective  Patient reports no falls or LOB since session, has been  active between sessions.    Pertinent History  Patient is a pleasant 85 year old female who presents to physical therapy for imbalance. Per previous documentation patient has been having unsteadiness for several months and burning in bilateral LE's and feet since 2015 after having a heart attack. PMH includes A fib, MI s/p stent placement, lymphoma, osteoporosis, ASCVD, atherosclerosis, atrophic vaginitis, CHF, HTN, fibrocystic disease of breast, GERD, glaucoma, HLD, colonic polyps, HLD, hypothyroidism, migraine, stress incontinence, polyneuropathy. It has been since October since she saw the physician for right sided weakness. Is having some pain in right hip, buttocks, leg, and foot. Foot is stabbing when first gets up and aches in the back of knee. Usually stumbles around corners.    Limitations  Lifting;Standing;Walking;House hold activities    How long can you sit comfortably?  all day    How long can you stand comfortably?  gets fatigued    How long can you walk comfortably?  2 blocks    Patient Stated Goals  to walk further    Currently in Pain?  No/denies    Pain Onset  --             Neuromuscular Re-education    airex pad: 7" step modified tandem stance 2x 30 seconds each LE airex pad : 7" step toe taps finger tip support 12x each LE airex pad sit to stands from standard height chair   Lateral cone stepping 4x length of 6 cones; 2 near LOB   Ambulate with horizontal head turns 2x 90 ft with occasional instability and increased shuffle step   Ambulate with vertical head turns 2x 90 ft with occasional instability.   TherEx  Standing Hip flexion marches with 2.5# ankle weights x 10 bilateral; Hip abduction with 2.5# x 10 bilateral Hip extension with 2.5 # x 10 bilateral   Seated: 2.5 ankle weights Marches 15x each LE  LAQ 15x each 3 second hold  RTB march with neutral hip alignment 15x each LE RTB abduction 15x each LE  Pt educated throughout session about proper  posture and technique with exercises. Improved exercise technique, movement at target joints, use of target muscles after min to mod verbal, visual, tactile cues.                  PT Education - 10/02/19 1208    Education Details  exercise technique, body mechanics    Person(s) Educated  Patient    Methods  Explanation;Demonstration;Tactile cues;Verbal cues    Comprehension  Verbalized understanding;Returned demonstration;Verbal cues required;Tactile cues required       PT Short Term Goals - 09/05/19 1242      PT SHORT TERM GOAL #1   Title  Patient will be independent in home exercise program to improve strength/mobility for better functional independence  with ADLs.    Baseline  1/14: give next session    Time  2    Period  Weeks    Status  New    Target Date  09/19/19        PT Long Term Goals - 09/05/19 1243      PT LONG TERM GOAL #1   Title  Patient will increase Berg Balance score by > 6 points (49/56)  to demonstrate decreased fall risk during functional activities.    Baseline  1/14: 43/56    Time  8    Period  Weeks    Status  New    Target Date  10/30/19      PT LONG TERM GOAL #2   Title  Patient will increase ABC scale score >80% to demonstrate better functional mobility and better confidence with ADLs    Baseline  1/14/ 49.4%    Time  8    Period  Weeks    Status  New    Target Date  10/30/19      PT LONG TERM GOAL #3   Title  Patient will increase dynamic gait index score to >19/24 as to demonstrate reduced fall risk and improved dynamic gait balance for better safety with community/home ambulation.    Baseline  1/14: 16/24    Time  8    Period  Weeks    Status  New    Target Date  10/30/19      PT LONG TERM GOAL #4   Title  Patient will increase BLE gross strength to 4+/5 as to improve functional strength for independent gait, increased standing tolerance and increased ADL ability.    Baseline  1/14: R 3+/5, L 4-/5    Time  8    Period   Weeks    Status  New    Target Date  10/30/19            Plan - 10/02/19 1218    Clinical Impression Statement  Patient presents with excellent motivation. She continues to progress with functional stability and mobility. Patient is challenged with single limb stability with occasional need for UE support/stability.Patient requires CGA for intermediate balance exercise especially with less rail assist. Patient would benefit from additional skilled PT intervention to improve balance/gait safety and reduce fall risk.    Personal Factors and Comorbidities  Age;Comorbidity 3+;Past/Current Experience;Social Background;Time since onset of injury/illness/exacerbation    Comorbidities  A fib, MI s/p stent placement, lymphoma, osteoporosis, ASCVD, atherosclerosis, atrophic vaginitis, CHF, HTN, fibrocystic disease of breast, GERD, glaucoma, HLD, colonic polyps, HLD, hypothyroidism, migraine, stress incontinence, polyneuropathy    Examination-Activity Limitations  Bend;Caring for Others;Carry;Continence;Dressing;Stairs;Squat;Sit;Reach Overhead;Locomotion Level;Lift;Stand;Toileting    Examination-Participation Restrictions  Church;Cleaning;Community Activity;Interpersonal Relationship;Laundry;Volunteer;Shop;Meal Prep;Yard Work    [employer]  Evolving/Moderate complexity    Rehab Potential  Good    PT Frequency  2x / week    PT Duration  8 weeks    PT Treatment/Interventions  ADLs/Self Care Home Management;Aquatic Therapy;Biofeedback;Canalith Repostioning;Cryotherapy;Electrical Stimulation;Moist Heat;Traction;Ultrasound;DME Instruction;Gait training;Stair training;Functional mobility training;Therapeutic activities;Therapeutic exercise;Balance training;Neuromuscular re-education;Patient/family education;Manual techniques;Passive range of motion;Dry needling;Energy conservation;Taping;Vestibular    PT Next Visit Plan  give HEP, single limb strength and stability    PT Home Exercise  Plan  give next session    Consulted and Agree with Plan of Care  Patient       Patient will benefit from skilled therapeutic intervention in order to improve the following deficits and impairments:  Abnormal gait, Cardiopulmonary  status limiting activity, Decreased activity tolerance, Decreased balance, Decreased endurance, Difficulty walking, Decreased strength, Postural dysfunction, Improper body mechanics  Visit Diagnosis: Unsteadiness on feet  Other abnormalities of gait and mobility  Muscle weakness (generalized)     Problem List Patient Active Problem List   Diagnosis Date Noted  . 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 H/O neoplasm 01/04/2015  . Arterial vascular disease 12/30/2013  . BP (high blood pressure) 12/30/2013  . Adult hypothyroidism 12/30/2013  . OP (osteoporosis) 12/30/2013  . Avitaminosis D 12/30/2013  . Atrial fibrillation ([place]) 12/30/2013  . HLD (hyperlipidemia) 12/30/2013  . Preoperative cardiovascular examination 04/07/2013  . Hyperlipidemia 04/07/2013  . A-fib ([place])   . Coronary artery disease   . Chronic systolic heart failure ([place])      Janna Arch, PT, DPT   10/02/2019, 12:19 PM  [place] MAIN [facility] SERVICES [address] [place], [place], [address] Phone: [phone]   Fax:  [phone]  Name: JEENA MANU MRN: [id number] Date of Birth: [birth year]

## 2019-10-07 ENCOUNTER — Other Ambulatory Visit: Payer: Self-pay

## 2019-10-07 ENCOUNTER — Ambulatory Visit: Payer: Medicare Other

## 2019-10-07 DIAGNOSIS — M6281 Muscle weakness (generalized): Secondary | ICD-10-CM

## 2019-10-07 DIAGNOSIS — R2681 Unsteadiness on feet: Secondary | ICD-10-CM | POA: Diagnosis not present

## 2019-10-07 DIAGNOSIS — R2689 Other abnormalities of gait and mobility: Secondary | ICD-10-CM

## 2019-10-07 NOTE — Therapy (Signed)
[place] MAIN [facility] SERVICES [address] [place], [place], [address] Phone: [phone]   Fax:  [phone]  Physical Therapy Treatment Physical Therapy Progress Note   Dates of reporting period  09/04/19   to   10/07/19  Patient Details  Name: Jeanne Pittman MRN: [id number] Date of Birth: [birth year] Referring Provider (PT): Jennings Books   Encounter Date: 10/07/2019  PT End of Session - 10/07/19 1119    Visit Number  10    Number of Visits  16    Date for PT Re-Evaluation  10/30/19    Authorization Type  10/10 eval 09/04/19; next visit 1/10 PN 10/07/19    PT Start Time  1115    PT Stop Time  1149    PT Time Calculation (min)  34 min    Equipment Utilized During Treatment  Gait belt    Activity Tolerance  Patient tolerated treatment well    Behavior During Therapy  WFL for tasks assessed/performed       Past Medical History:  Diagnosis Date  . A-fib ([place])    07/2012: A. fib with RVR in the setting of myocardial infarction. Converted to sinus rhythm with amiodarone. No recurrence.  . Anxiety   . Atrophic vaginitis   . CHF (congestive heart failure) ([place])   . Chronic combined systolic (congestive) and diastolic (congestive) heart failure ([place])    a. EF was 25-35% post MI but improved to 45-50% in 04/2013; b. 03/2017 Echo: EF 40-45%, mod focal/basal hypertrophy of septum. Sev mid-apicalanteroseptal, ant, and apical HK. Gr1 DD, mild AI/MR, mod TR, PASP 23mg.  . Colon adenomas   . Coronary artery disease    a. 118/2993STEMI complicated by cardiogenic shock. Cath/PCI: LAD 1049mDESx2), RCA 95p(DES). EF 25%; b. 03/2017 MV: EF 57%, fixed apical, periapical, mid to distal anteroseptal defect. No ischemia.  . Eczema   . Fibrocystic breast disease   . Gastritis   . GERD (gastroesophageal reflux disease)   . Glaucoma   . Headache   . History of gastritis   . Hyperlipidemia   . Hypertension   . Hypothyroidism   . Insomnia   . Ischemic  cardiomyopathy    a. 07/2012 EF 25-35% following MI-->Improved to 45-50%;  b. 03/2017 Echo: EF 40-45%.  . [place]  . [place] ([place]  . Non Hodgkin's lymphoma ([place]   reoccurance 2007 and 2012  . Osteoarthritis   . Osteoporosis   . Polyneuropathy   . Polyposis of colon   . Restless leg syndrome   . Urinary, incontinence, stress female     Past Surgical History:  Procedure Laterality Date  . ABDOMINAL HYSTERECTOMY  1956  . APPENDECTOMY    . AXILLARY LYMPH NODE BIOPSY Left 03/18/2015   Procedure: AXILLARY LYMPH NODE BIOPSY;  Surgeon: JeRobert BellowMD;  Location: [name];  Service: General;  Laterality: Left;  . CARDIAC CATHETERIZATION  08/19/2012   [name]; [place]  . COLONOSCOPY    . COLONOSCOPY WITH PROPOFOL N/A 03/02/2016   Procedure: COLONOSCOPY WITH PROPOFOL;  Surgeon: RoManya SilvasMD;  [facility];  Service: Endoscopy;  Laterality: N/A;  . COLONOSCOPY WITH PROPOFOL N/A 09/26/2017   Procedure: COLONOSCOPY WITH PROPOFOL;  Surgeon: ElManya SilvasMD;  [facility];  Service: Endoscopy;  Laterality: N/A;  . CORONARY ANGIOPLASTY WITH STENT PLACEMENT     x3 stents  . CORONARY ANGIOPLASTY WITH STENT PLACEMENT    . CORONARY ARTERY  BYPASS GRAFT    . ESOPHAGOGASTRODUODENOSCOPY (EGD) WITH PROPOFOL N/A 03/02/2016   Procedure: ESOPHAGOGASTRODUODENOSCOPY (EGD) WITH PROPOFOL;  Surgeon: Manya Silvas, MD;  Location[facility]COPY;  Service: Endoscopy;  Laterality: N/A;  . ESOPHAGOGASTRODUODENOSCOPY (EGD) WITH PROPOFOL N/A 04/14/2019   Procedure: ESOPHAGOGASTRODUODENOSCOPY (EGD) WITH PROPOFOL;  Surgeon: Toledo, Benay Pike, MD;  Location: [name] ENDOSCOPY;  Service: Gastroenterology;  Laterality: N/A;  . LYMPH NODE BIOPSY Right 07-24-11   Dr Bary Castilla  . MOHS SURGERY     bilateral shoulders  . PTCA    . skin cancer removal    . TOTAL ABDOMINAL HYSTERECTOMY W/ BILATERAL SALPINGOOPHORECTOMY      There were no vitals filed for  this visit.  Subjective Assessment - 10/07/19 1117    Subjective  Patient reports she is short of breath and short of energy, was out of medication for a few days but is back on it.  No falls or LOB since last session but is very fatigued today.    Pertinent History  Patient is a pleasant 85 year old female who presents to physical therapy for imbalance. Per previous documentation patient has been having unsteadiness for several months and burning in bilateral LE's and feet since 2015 after having a heart attack. PMH includes A fib, MI s/p stent placement, lymphoma, osteoporosis, ASCVD, atherosclerosis, atrophic vaginitis, CHF, HTN, fibrocystic disease of breast, GERD, glaucoma, HLD, colonic polyps, HLD, hypothyroidism, migraine, stress incontinence, polyneuropathy. It has been since October since she saw the physician for right sided weakness. Is having some pain in right hip, buttocks, leg, and foot. Foot is stabbing when first gets up and aches in the back of knee. Usually stumbles around corners.    Limitations  Lifting;Standing;Walking;House hold activities    How long can you sit comfortably?  all day    How long can you stand comfortably?  gets fatigued    How long can you walk comfortably?  2 blocks    Patient Stated Goals  to walk further    Currently in Pain?  No/denies       Patient reports she is short of breath and short of energy, was out of medication for a few days but is back on it.   [facility] PT Assessment - 10/07/19 0001      Standardized Balance Assessment   Standardized Balance Assessment  Berg Balance Test;Dynamic Gait Index      Berg Balance Test   Sit to Stand  Able to stand without using hands and stabilize independently    Standing Unsupported  Able to stand safely 2 minutes    Sitting with Back Unsupported but Feet Supported on Floor or Stool  Able to sit safely and securely 2 minutes    Stand to Sit  Sits safely with minimal use of hands    Transfers  Able to  transfer safely, minor use of hands    Standing Unsupported with Eyes Closed  Able to stand 10 seconds safely    Standing Unsupported with Feet Together  Able to place feet together independently and stand for 1 minute with supervision    From Standing, Reach Forward with Outstretched Arm  Can reach forward >12 cm safely (5")    From Standing Position, Pick up Object from Floor  Able to pick up shoe safely and easily    From Standing Position, Turn to Look Behind Over each Shoulder  Looks behind from both sides and weight shifts well    Turn 360 Degrees  Able  to turn 360 degrees safely in 4 seconds or less    Standing Unsupported, Alternately Place Feet on Step/Stool  Able to stand independently and safely and complete 8 steps in 20 seconds    Standing Unsupported, One Foot in Front  Able to take small step independently and hold 30 seconds    Standing on One Leg  Able to lift leg independently and hold 5-10 seconds    Total Score  51      Dynamic Gait Index   Level Surface  Normal    Change in Gait Speed  Normal    Gait with Horizontal Head Turns  Moderate Impairment    Gait with Vertical Head Turns  Mild Impairment    Gait and Pivot Turn  Mild Impairment    Step Over Obstacle  Mild Impairment    Step Around Obstacles  Normal    Steps  Normal    Total Score  19         FOTO: 57.9- only 1 point change   BERG: 51/56  ABC: 57.5%  DGI: 19/24  BLE strength R grossly 4-/5 L grossly 4-/5   Patient requested session to end after goal performance due to fatigue.    Patient's condition has the potential to improve in response to therapy. Maximum improvement is yet to be obtained. The anticipated improvement is attainable and reasonable in a generally predictable time.  Patient reports her balance is improving and she is feeling more confident with her work. Patient reports she wants lower back to be stronger: feels like she is losing her balance due to her back hurting. So wants to improve  her back strength and LE strength.     Patient presents to physical therapy with excessive fatigue. Patient demonstrated clinically significant improvements in balance tests: increasing her BERG score to 51/56, DGI 19/24, and ABC to 57.5%.  Patient's condition has the potential to improve in response to therapy. Maximum improvement is yet to be obtained. The anticipated improvement is attainable and reasonable in a generally predictable time. Patient would benefit from additional skilled PT intervention to improve balance/gait safety and reduce fall risk.           PT Education - 10/07/19 1118    Education Details  goals, POC, body mechanics, energy conservation    Person(s) Educated  Patient    Methods  Explanation;Demonstration;Tactile cues;Verbal cues    Comprehension  Verbalized understanding;Returned demonstration;Verbal cues required;Tactile cues required       PT Short Term Goals - 10/07/19 1120      PT SHORT TERM GOAL #1   Title  Patient will be independent in home exercise program to improve strength/mobility for better functional independence with ADLs.    Baseline  1/14: give next session 2/16: HEP compliant    Time  2    Period  Weeks    Status  Partially Met    Target Date  10/21/19        PT Long Term Goals - 10/07/19 1158      PT LONG TERM GOAL #1   Title  Patient will increase Berg Balance score by > 6 points (49/56)  to demonstrate decreased fall risk during functional activities.    Baseline  1/14: 43/56 2/16: 51/56    Time  8    Period  Weeks    Status  Achieved      PT LONG TERM GOAL #2   Title  Patient will increase ABC scale score >  80% to demonstrate better functional mobility and better confidence with ADLs    Baseline  1/14/ 49.4% 2/16: 57.5%    Time  8    Period  Weeks    Status  Partially Met    Target Date  10/30/19      PT LONG TERM GOAL #3   Title  Patient will increase dynamic gait index score to >19/24 as to demonstrate reduced fall  risk and improved dynamic gait balance for better safety with community/home ambulation.    Baseline  1/14: 16/24 2/16: 19/24    Time  8    Period  Weeks    Status  Partially Met    Target Date  10/30/19      PT LONG TERM GOAL #4   Title  Patient will increase BLE gross strength to 4+/5 as to improve functional strength for independent gait, increased standing tolerance and increased ADL ability.    Baseline  1/14: R 3+/5, L 4-/5 2/16: R grossly 4-/5 L grossly 4-/5    Time  8    Period  Weeks    Status  Partially Met    Target Date  10/30/19            Plan - 10/07/19 1220    Clinical Impression Statement  Patient presents to physical therapy with excessive fatigue. Patient demonstrated clinically significant improvements in balance tests: increasing her BERG score to 51/56, DGI 19/24, and ABC to 57.5%.  Patient's condition has the potential to improve in response to therapy. Maximum improvement is yet to be obtained. The anticipated improvement is attainable and reasonable in a generally predictable time. Patient would benefit from additional skilled PT intervention to improve balance/gait safety and reduce fall risk.    Personal Factors and Comorbidities  Age;Comorbidity 3+;Past/Current Experience;Social Background;Time since onset of injury/illness/exacerbation    Comorbidities  A fib, MI s/p stent placement, lymphoma, osteoporosis, ASCVD, atherosclerosis, atrophic vaginitis, CHF, HTN, fibrocystic disease of breast, GERD, glaucoma, HLD, colonic polyps, HLD, hypothyroidism, migraine, stress incontinence, polyneuropathy    Examination-Activity Limitations  Bend;Caring for Others;Carry;Continence;Dressing;Stairs;Squat;Sit;Reach Overhead;Locomotion Level;Lift;Stand;Toileting    Examination-Participation Restrictions  Church;Cleaning;Community Activity;Interpersonal Relationship;Laundry;Volunteer;Shop;Meal Prep;Yard Work    [employer]  Evolving/Moderate complexity     Rehab Potential  Good    PT Frequency  2x / week    PT Duration  8 weeks    PT Treatment/Interventions  ADLs/Self Care Home Management;Aquatic Therapy;Biofeedback;Canalith Repostioning;Cryotherapy;Electrical Stimulation;Moist Heat;Traction;Ultrasound;DME Instruction;Gait training;Stair training;Functional mobility training;Therapeutic activities;Therapeutic exercise;Balance training;Neuromuscular re-education;Patient/family education;Manual techniques;Passive range of motion;Dry needling;Energy conservation;Taping;Vestibular    PT Next Visit Plan  give HEP, single limb strength and stability    PT Home Exercise Plan  give next session    Consulted and Agree with Plan of Care  Patient       Patient will benefit from skilled therapeutic intervention in order to improve the following deficits and impairments:  Abnormal gait, Cardiopulmonary status limiting activity, Decreased activity tolerance, Decreased balance, Decreased endurance, Difficulty walking, Decreased strength, Postural dysfunction, Improper body mechanics  Visit Diagnosis: Unsteadiness on feet  Other abnormalities of gait and mobility  Muscle weakness (generalized)     Problem List Patient Active Problem List   Diagnosis Date Noted  . 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 H/O neoplasm 01/04/2015  . Arterial vascular disease 12/30/2013  . BP (high blood pressure) 12/30/2013  . Adult hypothyroidism 12/30/2013  . OP (osteoporosis) 12/30/2013  . Avitaminosis D 12/30/2013  . Atrial fibrillation ([place]) 12/30/2013  .  HLD (hyperlipidemia) 12/30/2013  . Preoperative cardiovascular examination 04/07/2013  . Hyperlipidemia 04/07/2013  . A-fib ([place])   . Coronary artery disease   . Chronic systolic heart failure [facility])    MRI examination of the abdomen  10/07/2019, 12:21 PM  [place] MAIN [facility] SERVICES [place], [place], [address] Phone:  [phone]   Fax:  [phone]  Name: Jeanne Pittman MRN: [id number] Date of Birth: [birth year]

## 2019-10-09 ENCOUNTER — Ambulatory Visit: Payer: Medicare Other

## 2019-10-14 ENCOUNTER — Ambulatory Visit: Payer: Medicare Other

## 2019-10-16 ENCOUNTER — Other Ambulatory Visit: Payer: Self-pay

## 2019-10-16 ENCOUNTER — Ambulatory Visit: Payer: Medicare Other

## 2019-10-16 DIAGNOSIS — R2689 Other abnormalities of gait and mobility: Secondary | ICD-10-CM

## 2019-10-16 DIAGNOSIS — R2681 Unsteadiness on feet: Secondary | ICD-10-CM

## 2019-10-16 DIAGNOSIS — M6281 Muscle weakness (generalized): Secondary | ICD-10-CM

## 2019-10-16 NOTE — Therapy (Signed)
[place] MAIN [facility] SERVICES [address] [place], [place], [address] Phone: [phone]   Fax:  [phone]  Physical Therapy Treatment  Patient Details  Name: Jeanne Pittman MRN: [id number] Date of Birth: Apr 27, 1935 Referring Provider (PT): Jennings Books   Encounter Date: 10/16/2019  PT End of Session - 10/16/19 1124    Visit Number  11    Number of Visits  16    Date for PT Re-Evaluation  10/30/19    Authorization Type  1/10 PN 10/07/19    PT Start Time  1118    PT Stop Time  1200    PT Time Calculation (min)  42 min    Equipment Utilized During Treatment  Gait belt    Activity Tolerance  Patient tolerated treatment well    Behavior During Therapy  WFL for tasks assessed/performed       Past Medical History:  Diagnosis Date  . A-fib ([place])    07/2012: A. fib with RVR in the setting of myocardial infarction. Converted to sinus rhythm with amiodarone. No recurrence.  . Anxiety   . Atrophic vaginitis   . CHF (congestive heart failure) ([place])   . Chronic combined systolic (congestive) and diastolic (congestive) heart failure ([place])    a. EF was 25-35% post MI but improved to 45-50% in 04/2013; b. 03/2017 Echo: EF 40-45%, mod focal/basal hypertrophy of septum. Sev mid-apicalanteroseptal, ant, and apical HK. Gr1 DD, mild AI/MR, mod TR, PASP 4mg.  . Colon adenomas   . Coronary artery disease    a. 164/4034STEMI complicated by cardiogenic shock. Cath/PCI: LAD 1018mDESx2), RCA 95p(DES). EF 25%; b. 03/2017 MV: EF 57%, fixed apical, periapical, mid to distal anteroseptal defect. No ischemia.  . Eczema   . Fibrocystic breast disease   . Gastritis   . GERD (gastroesophageal reflux disease)   . Glaucoma   . Headache   . History of gastritis   . Hyperlipidemia   . Hypertension   . Hypothyroidism   . Insomnia   . Ischemic cardiomyopathy    a. 07/2012 EF 25-35% following MI-->Improved to 45-50%;  b. 03/2017 Echo: EF 40-45%.  . Marland Kitchenymphoma ([facility]   . My[place] infarction ([place]/29/2015  . Non Hodgkin's lymphoma (HCJeffersonville2005   reoccurance 2007 and 2012  . Osteoarthritis   . Osteoporosis   . Polyneuropathy   . Polyposis of colon   . Restless leg syndrome   . Urinary, incontinence, stress female     Past Surgical History:  Procedure Laterality Date  . ABDOMINAL HYSTERECTOMY  1956  . APPENDECTOMY    . AXILLARY LYMPH NODE BIOPSY Left 03/18/2015   Procedure: AXILLARY LYMPH NODE BIOPSY;  Surgeon: JeRobert BellowMD;  Location: [name];  Service: General;  Laterality: Left;  . CARDIAC CATHETERIZATION  08/19/2012   [name]; [facility]  . COLONOSCOPY    . COLONOSCOPY WITH PROPOFOL N/A 03/02/2016   Procedure: COLONOSCOPY WITH PROPOFOL;  Surgeon: RoManya SilvasMD;  [facility];  Service: Endoscopy;  Laterality: N/A;  . COLONOSCOPY WITH PROPOFOL N/A 09/26/2017   Procedure: COLONOSCOPY WITH PROPOFOL;  Surgeon: ElManya SilvasMD;  [facility];  Service: Endoscopy;  Laterality: N/A;  . CORONARY ANGIOPLASTY WITH STENT PLACEMENT     x3 stents  . CORONARY ANGIOPLASTY WITH STENT PLACEMENT    . CORONARY ARTERY BYPASS GRAFT    . ESOPHAGOGASTRODUODENOSCOPY (EGD) WITH PROPOFOL N/A 03/02/2016   Procedure: ESOPHAGOGASTRODUODENOSCOPY (EGD) WITH PROPOFOL;  Surgeon: RoGavin Pound  Vira Agar, MD;  Location: [place] ENDOSCOPY;  Service: Endoscopy;  Laterality: N/A;  . ESOPHAGOGASTRODUODENOSCOPY (EGD) WITH PROPOFOL N/A 04/14/2019   Procedure: ESOPHAGOGASTRODUODENOSCOPY (EGD) WITH PROPOFOL;  Surgeon: Toledo, Benay Pike, MD;  Location: [name] ENDOSCOPY;  Service: Gastroenterology;  Laterality: N/A;  . LYMPH NODE BIOPSY Right 07-24-11   Dr Bary Castilla  . MOHS SURGERY     bilateral shoulders  . PTCA    . skin cancer removal    . TOTAL ABDOMINAL HYSTERECTOMY W/ BILATERAL SALPINGOOPHORECTOMY      There were no vitals filed for this visit.  Subjective Assessment - 10/16/19 1122    Subjective  Patient missed last session due to having to watch  grandchildren. Is very fatigued and does have some low back pain from constant movement when carying from them.    Pertinent History  Patient is a pleasant 85 year old female who presents to physical therapy for imbalance. Per previous documentation patient has been having unsteadiness for several months and burning in bilateral LE's and feet since 2015 after having a heart attack. PMH includes A fib, MI s/p stent placement, lymphoma, osteoporosis, ASCVD, atherosclerosis, atrophic vaginitis, CHF, HTN, fibrocystic disease of breast, GERD, glaucoma, HLD, colonic polyps, HLD, hypothyroidism, migraine, stress incontinence, polyneuropathy. It has been since October since she saw the physician for right sided weakness. Is having some pain in right hip, buttocks, leg, and foot. Foot is stabbing when first gets up and aches in the back of knee. Usually stumbles around corners.    Limitations  Lifting;Standing;Walking;House hold activities    How long can you sit comfortably?  all day    How long can you stand comfortably?  gets fatigued    How long can you walk comfortably?  2 blocks    Patient Stated Goals  to walk further    Currently in Pain?  Yes    Pain Score  3     Pain Location  Back    Pain Orientation  Lower    Pain Descriptors / Indicators  Aching    Pain Type  Chronic pain          Neuromuscular Re-education  Matrix resisted ambulation 7.5 2x each direction, forward, backwards, lateral L, lateral R   airex pad: 7" step modified tandem stance 2x 30 seconds each LE  airex pad : 7" step toe no UE support 12x each LE   airex pad: 7" step lateral toe taps no UE support 15x each LE    Ambulate with horizontal head turns 2x 60 ft with occasional instability and increased shuffle step    Ambulate with vertical head turns 2x 60 ft with occasional instability.    TherEx Precor Leg press: bilateral LE #25 15x, 2 sets; cueing for sequencing     Seated: 2.5 ankle weights Marches 15x each LE   LAQ 15x each 3 second hold     Pt educated throughout session about proper posture and technique with exercises. Improved exercise technique, movement at target joints, use of target muscles after min to mod verbal, visual, tactile cues.                     PT Education - 10/16/19 1123    Education Details  exercise technique, body mechanics, energy conservation    Person(s) Educated  Patient    Methods  Explanation;Demonstration;Tactile cues;Verbal cues    Comprehension  Verbalized understanding;Returned demonstration;Verbal cues required;Tactile cues required       PT Short Term Goals -  10/07/19 1120      PT SHORT TERM GOAL #1   Title  Patient will be independent in home exercise program to improve strength/mobility for better functional independence with ADLs.    Baseline  1/14: give next session 2/16: HEP compliant    Time  2    Period  Weeks    Status  Partially Met    Target Date  10/21/19        PT Long Term Goals - 10/07/19 1158      PT LONG TERM GOAL #1   Title  Patient will increase Berg Balance score by > 6 points (49/56)  to demonstrate decreased fall risk during functional activities.    Baseline  1/14: 43/56 2/16: 51/56    Time  8    Period  Weeks    Status  Achieved      PT LONG TERM GOAL #2   Title  Patient will increase ABC scale score >80% to demonstrate better functional mobility and better confidence with ADLs    Baseline  1/14/ 49.4% 2/16: 57.5%    Time  8    Period  Weeks    Status  Partially Met    Target Date  10/30/19      PT LONG TERM GOAL #3   Title  Patient will increase dynamic gait index score to >19/24 as to demonstrate reduced fall risk and improved dynamic gait balance for better safety with community/home ambulation.    Baseline  1/14: 16/24 2/16: 19/24    Time  8    Period  Weeks    Status  Partially Met    Target Date  10/30/19      PT LONG TERM GOAL #4   Title  Patient will increase BLE gross strength to  4+/5 as to improve functional strength for independent gait, increased standing tolerance and increased ADL ability.    Baseline  1/14: R 3+/5, L 4-/5 2/16: R grossly 4-/5 L grossly 4-/5    Time  8    Period  Weeks    Status  Partially Met    Target Date  10/30/19            Plan - 10/16/19 1322    Clinical Impression Statement  Patient presents to physical therapy with excellent motivation. Her capacity for functional mobility is limited by LE fatigue as well as shortness of breath. Resisted ambulation is challenging for patient due to strength and single limb demands. Patient requires CGA for intermediate balance exercise especially with less rail assist. Patient would benefit from additional skilled PT intervention to improve balance/gait safety and reduce fall risk.    Personal Factors and Comorbidities  Age;Comorbidity 3+;Past/Current Experience;Social Background;Time since onset of injury/illness/exacerbation    Comorbidities  A fib, MI s/p stent placement, lymphoma, osteoporosis, ASCVD, atherosclerosis, atrophic vaginitis, CHF, HTN, fibrocystic disease of breast, GERD, glaucoma, HLD, colonic polyps, HLD, hypothyroidism, migraine, stress incontinence, polyneuropathy    Examination-Activity Limitations  Bend;Caring for Others;Carry;Continence;Dressing;Stairs;Squat;Sit;Reach Overhead;Locomotion Level;Lift;Stand;Toileting    Examination-Participation Restrictions  Church;Cleaning;Community Activity;Interpersonal Relationship;Laundry;Volunteer;Shop;Meal Prep;Yard Work    [employer]  Evolving/Moderate complexity    Rehab Potential  Good    PT Frequency  2x / week    PT Duration  8 weeks    PT Treatment/Interventions  ADLs/Self Care Home Management;Aquatic Therapy;Biofeedback;Canalith Repostioning;Cryotherapy;Electrical Stimulation;Moist Heat;Traction;Ultrasound;DME Instruction;Gait training;Stair training;Functional mobility training;Therapeutic activities;Therapeutic  exercise;Balance training;Neuromuscular re-education;Patient/family education;Manual techniques;Passive range of motion;Dry needling;Energy conservation;Taping;Vestibular    PT Next Visit Plan  give  HEP, single limb strength and stability    PT Home Exercise Plan  give next session    Consulted and Agree with Plan of Care  Patient       Patient will benefit from skilled therapeutic intervention in order to improve the following deficits and impairments:  Abnormal gait, Cardiopulmonary status limiting activity, Decreased activity tolerance, Decreased balance, Decreased endurance, Difficulty walking, Decreased strength, Postural dysfunction, Improper body mechanics  Visit Diagnosis: Unsteadiness on feet  Other abnormalities of gait and mobility  Muscle weakness (generalized)     Problem List Patient Active Problem List   Diagnosis Date Noted  . 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 H/O neoplasm 01/04/2015  . Arterial vascular disease 12/30/2013  . BP (high blood pressure) 12/30/2013  . Adult hypothyroidism 12/30/2013  . OP (osteoporosis) 12/30/2013  . Avitaminosis D 12/30/2013  . Atrial fibrillation ([place]) 12/30/2013  . HLD (hyperlipidemia) 12/30/2013  . Preoperative cardiovascular examination 04/07/2013  . Hyperlipidemia 04/07/2013  . A-fib ([place])   . Coronary artery disease   . Chronic systolic heart failure ([place])    Janna Arch, PT, DPT   10/16/2019, 1:23 PM  [place] MAIN [facility] SERVICES [address] [place], [place], [address] Phone: [phone]   Fax:  [phone]  Name: Jeanne Pittman MRN: [id number] Date of Birth: [birth year]

## 2019-10-21 ENCOUNTER — Ambulatory Visit: Payer: Medicare Other | Attending: Neurology

## 2019-10-21 ENCOUNTER — Other Ambulatory Visit: Payer: Self-pay

## 2019-10-21 DIAGNOSIS — R2681 Unsteadiness on feet: Secondary | ICD-10-CM

## 2019-10-21 DIAGNOSIS — M6281 Muscle weakness (generalized): Secondary | ICD-10-CM | POA: Diagnosis present

## 2019-10-21 DIAGNOSIS — R2689 Other abnormalities of gait and mobility: Secondary | ICD-10-CM | POA: Diagnosis present

## 2019-10-21 NOTE — Therapy (Signed)
[place] MAIN [facility] SERVICES [address] [place], [place], [address] Phone: [phone]   Fax:  [phone]  Physical Therapy Treatment  Patient Details  Name: Jeanne Pittman MRN: [id number] Date of Birth: 07/20/1935 Referring Provider (PT): Jennings Books   Encounter Date: 10/21/2019  PT End of Session - 10/21/19 1112    Visit Number  12    Number of Visits  16    Date for PT Re-Evaluation  10/30/19    Authorization Type  2/10 PN 10/07/19    PT Start Time  1101    PT Stop Time  1141    PT Time Calculation (min)  40 min    Equipment Utilized During Treatment  Gait belt    Activity Tolerance  Patient tolerated treatment well;No increased pain    Behavior During Therapy  WFL for tasks assessed/performed       Past Medical History:  Diagnosis Date  . A-fib ([place])    07/2012: A. fib with RVR in the setting of myocardial infarction. Converted to sinus rhythm with amiodarone. No recurrence.  . Anxiety   . Atrophic vaginitis   . CHF (congestive heart failure) ([place])   . Chronic combined systolic (congestive) and diastolic (congestive) heart failure ([place])    a. EF was 25-35% post MI but improved to 45-50% in 04/2013; b. 03/2017 Echo: EF 40-45%, mod focal/basal hypertrophy of septum. Sev mid-apicalanteroseptal, ant, and apical HK. Gr1 DD, mild AI/MR, mod TR, PASP 28mg.  . Colon adenomas   . Coronary artery disease    a. 110/9323STEMI complicated by cardiogenic shock. Cath/PCI: LAD 1023mDESx2), RCA 95p(DES). EF 25%; b. 03/2017 MV: EF 57%, fixed apical, periapical, mid to distal anteroseptal defect. No ischemia.  . Eczema   . Fibrocystic breast disease   . Gastritis   . GERD (gastroesophageal reflux disease)   . Glaucoma   . Headache   . History of gastritis   . Hyperlipidemia   . Hypertension   . Hypothyroidism   . Insomnia   . Ischemic cardiomyopathy    a. 07/2012 EF 25-35% following MI-->Improved to 45-50%;  b. 03/2017 Echo: EF 40-45%.  . Marland KitchenLymphoma (HCHelena Valley Northwest  . Myocardial infarction ([place]CM[place]12/29/2015  . Non Hodgkin's lymphoma (HCSh[place]2005   reoccurance 2007 and 2012  . Osteoarthritis   . Osteoporosis   . Polyneuropathy   . Polyposis of colon   . Restless leg syndrome   . Urinary, incontinence, stress female     Past Surgical History:  Procedure Laterality Date  . ABDOMINAL HYSTERECTOMY  1956  . APPENDECTOMY    . AXILLARY LYMPH NODE BIOPSY Left 03/18/2015   Procedure: AXILLARY LYMPH NODE BIOPSY;  Surgeon: JeRobert BellowMD;  Location: [name];  Service: General;  Laterality: Left;  . CARDIAC CATHETERIZATION  08/19/2012   [name]; [facility]  . COLONOSCOPY    . COLONOSCOPY WITH PROPOFOL N/A 03/02/2016   Procedure: COLONOSCOPY WITH PROPOFOL;  Surgeon: RoManya SilvasMD;  [facility];  Service: Endoscopy;  Laterality: N/A;  . COLONOSCOPY WITH PROPOFOL N/A 09/26/2017   Procedure: COLONOSCOPY WITH PROPOFOL;  Surgeon: ElManya SilvasMD;  [facility];  Service: Endoscopy;  Laterality: N/A;  . CORONARY ANGIOPLASTY WITH STENT PLACEMENT     x3 stents  . CORONARY ANGIOPLASTY WITH STENT PLACEMENT    . CORONARY ARTERY BYPASS GRAFT    . ESOPHAGOGASTRODUODENOSCOPY (EGD) WITH PROPOFOL N/A 03/02/2016   Procedure: ESOPHAGOGASTRODUODENOSCOPY (EGD) WITH PROPOFOL;  Surgeon:  Manya Silvas, MD;  [facility];  Service: Endoscopy;  Laterality: N/A;  . ESOPHAGOGASTRODUODENOSCOPY (EGD) WITH PROPOFOL N/A 04/14/2019   Procedure: ESOPHAGOGASTRODUODENOSCOPY (EGD) WITH PROPOFOL;  Surgeon: Toledo, Benay Pike, MD;  Location: [name] ENDOSCOPY;  Service: Gastroenterology;  Laterality: N/A;  . LYMPH NODE BIOPSY Right 07-24-11   Dr Bary Castilla  . MOHS SURGERY     bilateral shoulders  . PTCA    . skin cancer removal    . TOTAL ABDOMINAL HYSTERECTOMY W/ BILATERAL SALPINGOOPHORECTOMY      There were no vitals filed for this visit.  Subjective Assessment - 10/21/19 1111    Subjective  Pt doing well. Chronic back pain still  hurts with housework. MD visit coming up end of this week.    Pertinent History  Patient is a pleasant 85 year old female who presents to physical therapy for imbalance. Per previous documentation patient has been having unsteadiness for several months and burning in bilateral LE's and feet since 2015 after having a heart attack. PMH includes A fib, MI s/p stent placement, lymphoma, osteoporosis, ASCVD, atherosclerosis, atrophic vaginitis, CHF, HTN, fibrocystic disease of breast, GERD, glaucoma, HLD, colonic polyps, HLD, hypothyroidism, migraine, stress incontinence, polyneuropathy. It has been since October since she saw the physician for right sided weakness. Is having some pain in right hip, buttocks, leg, and foot. Foot is stabbing when first gets up and aches in the back of knee. Usually stumbles around corners.    Currently in Pain?  No/denies         INTERVENTION THIS DATE: -overground AMB 578f, no assistive device, no LOB, low exertion, 1.042m  Neuromuscular Re-education  -airex pad: 7" step modified tandem stance 2x 30 seconds each LE -airex pad : 7" step tap no UE support 12x each LE  -SLS 10x5secH bilat (added to HEP)  -semi tandem stance balance 2x30sec bilat (added to HEP)  -Matrix resisted ambulation 7.5 2x each direction, forward, backwards, lateral L, lateral R    TherEx -Standing to tall kneeling on floor mat x5, strategy to come to lunge pose and UE to rise up.  -rest breaks provided as needed.    HEP Issued as follows:     PT Short Term Goals - 10/07/19 1120      PT SHORT TERM GOAL #1   Title  Patient will be independent in home exercise program to improve strength/mobility for better functional independence with ADLs.    Baseline  1/14: give next session 2/16: HEP compliant    Time  2    Period  Weeks    Status  Partially Met    Target Date  10/21/19        PT Long Term Goals - 10/07/19 1158      PT LONG TERM GOAL #1   Title  Patient will increase Berg  Balance score by > 6 points (49/56)  to demonstrate decreased fall risk during functional activities.    Baseline  1/14: 43/56 2/16: 51/56    Time  8    Period  Weeks    Status  Achieved      PT LONG TERM GOAL #2   Title  Patient will increase ABC scale score >80% to demonstrate better functional mobility and better confidence with ADLs    Baseline  1/14/ 49.4% 2/16: 57.5%    Time  8    Period  Weeks    Status  Partially Met    Target Date  10/30/19  PT LONG TERM GOAL #3   Title  Patient will increase dynamic gait index score to >19/24 as to demonstrate reduced fall risk and improved dynamic gait balance for better safety with community/home ambulation.    Baseline  1/14: 16/24 2/16: 19/24    Time  8    Period  Weeks    Status  Partially Met    Target Date  10/30/19      PT LONG TERM GOAL #4   Title  Patient will increase BLE gross strength to 4+/5 as to improve functional strength for independent gait, increased standing tolerance and increased ADL ability.    Baseline  1/14: R 3+/5, L 4-/5 2/16: R grossly 4-/5 L grossly 4-/5    Time  8    Period  Weeks    Status  Partially Met    Target Date  10/30/19            Plan - 10/21/19 1113    Clinical Impression Statement  Pt able to complete entire session as planned with rest breaks provided as needed. Pt maintains high level of focus and motivation. Extensive verbal, visual, and tactile cues are provided for most accurate form possible. Author provides minA intermittently for full ROM when needed. Overall pt continues to make steady progress toward treatment goals.    Comorbidities  A fib, MI s/p stent placement, lymphoma, osteoporosis, ASCVD, atherosclerosis, atrophic vaginitis, CHF, HTN, fibrocystic disease of breast, GERD, glaucoma, HLD, colonic polyps, HLD, hypothyroidism, migraine, stress incontinence, polyneuropathy    Rehab Potential  Good    PT Frequency  2x / week    PT Duration  8 weeks    PT  Treatment/Interventions  ADLs/Self Care Home Management;Aquatic Therapy;Biofeedback;Canalith Repostioning;Cryotherapy;Electrical Stimulation;Moist Heat;Traction;Ultrasound;DME Instruction;Gait training;Stair training;Functional mobility training;Therapeutic activities;Therapeutic exercise;Balance training;Neuromuscular re-education;Patient/family education;Manual techniques;Passive range of motion;Dry needling;Energy conservation;Taping;Vestibular    PT Next Visit Plan  give HEP, single limb strength and stability    PT Home Exercise Plan  give next session    Consulted and Agree with Plan of Care  Patient       Patient will benefit from skilled therapeutic intervention in order to improve the following deficits and impairments:  Abnormal gait, Cardiopulmonary status limiting activity, Decreased activity tolerance, Decreased balance, Decreased endurance, Difficulty walking, Decreased strength, Postural dysfunction, Improper body mechanics  Visit Diagnosis: Unsteadiness on feet  Other abnormalities of gait and mobility  Muscle weakness (generalized)     Problem List Patient Active Problem List   Diagnosis Date Noted  . 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 H/O neoplasm 01/04/2015  . Arterial vascular disease 12/30/2013  . BP (high blood pressure) 12/30/2013  . Adult hypothyroidism 12/30/2013  . OP (osteoporosis) 12/30/2013  . Avitaminosis D 12/30/2013  . Atrial fibrillation ([place]) 12/30/2013  . HLD (hyperlipidemia) 12/30/2013  . Preoperative cardiovascular examination 04/07/2013  . Hyperlipidemia 04/07/2013  . A-fib ([place])   . Coronary artery disease   . Chronic systolic heart failure (HCC)    11:41 AM, 10/21/19 Etta Grandchild, PT, DPT Physical Therapist - [place] [phone]     Etta Grandchild 10/21/2019, 11:16 AM  [place] MAIN  [facility] SERVICES [address] [place], [place], [address] Phone: [phone]   Fax:  [phone]  Name: Jeanne Pittman MRN: [id number] Date of Birth: Mar 14, 1935

## 2019-10-23 ENCOUNTER — Ambulatory Visit: Payer: Medicare Other

## 2019-10-23 ENCOUNTER — Other Ambulatory Visit: Payer: Self-pay

## 2019-10-23 DIAGNOSIS — R2681 Unsteadiness on feet: Secondary | ICD-10-CM | POA: Diagnosis not present

## 2019-10-23 DIAGNOSIS — M6281 Muscle weakness (generalized): Secondary | ICD-10-CM

## 2019-10-23 DIAGNOSIS — R2689 Other abnormalities of gait and mobility: Secondary | ICD-10-CM

## 2019-10-23 NOTE — Therapy (Signed)
[place] MAIN [facility] SERVICES [address] [place], [place], [address] Phone: [phone]   Fax:  [phone]  Physical Therapy Treatment  Patient Details  Name: Jeanne Pittman MRN: [id number] Date of Birth: 11-Jan-1935 Referring Provider (PT): Jennings Books   Encounter Date: 10/23/2019  PT End of Session - 10/23/19 1117    Visit Number  13    Number of Visits  16    Date for PT Re-Evaluation  10/30/19    Authorization Type  2/10 PN 10/07/19    PT Start Time  1111    PT Stop Time  1151    PT Time Calculation (min)  40 min    Equipment Utilized During Treatment  Gait belt    Activity Tolerance  Patient tolerated treatment well;No increased pain    Behavior During Therapy  WFL for tasks assessed/performed       Past Medical History:  Diagnosis Date  . A-fib ([place])    07/2012: A. fib with RVR in the setting of myocardial infarction. Converted to sinus rhythm with amiodarone. No recurrence.  . Anxiety   . Atrophic vaginitis   . CHF (congestive heart failure) ([place])   . Chronic combined systolic (congestive) and diastolic (congestive) heart failure ([place])    a. EF was 25-35% post MI but improved to 45-50% in 04/2013; b. 03/2017 Echo: EF 40-45%, mod focal/basal hypertrophy of septum. Sev mid-apicalanteroseptal, ant, and apical HK. Gr1 DD, mild AI/MR, mod TR, PASP 56mg.  . Colon adenomas   . Coronary artery disease    a. 183/4196STEMI complicated by cardiogenic shock. Cath/PCI: LAD 1071mDESx2), RCA 95p(DES). EF 25%; b. 03/2017 MV: EF 57%, fixed apical, periapical, mid to distal anteroseptal defect. No ischemia.  . Eczema   . Fibrocystic breast disease   . Gastritis   . GERD (gastroesophageal reflux disease)   . Glaucoma   . Headache   . History of gastritis   . Hyperlipidemia   . Hypertension   . Hypothyroidism   . Insomnia   . Ischemic cardiomyopathy    a. 07/2012 EF 25-35% following MI-->Improved to 45-50%;  b. 03/2017 Echo: EF 40-45%.  . Marland KitchenLymphoma (HCOnida  . Myocardial infarction (HCPoydras12/29/2015  . Non Hodgkin's lymphoma (HCPeru2005   reoccurance 2007 and 2012  . Osteoarthritis   . Osteoporosis   . Polyneuropathy   . Polyposis of colon   . Restless leg syndrome   . Urinary, incontinence, stress female     Past Surgical History:  Procedure Laterality Date  . ABDOMINAL HYSTERECTOMY  1956  . APPENDECTOMY    . AXILLARY LYMPH NODE BIOPSY Left 03/18/2015   Procedure: AXILLARY LYMPH NODE BIOPSY;  Surgeon: JeRobert BellowMD;  Location: [name];  Service: General;  Laterality: Left;  . CARDIAC CATHETERIZATION  08/19/2012   [name]; [place]  . COLONOSCOPY    . COLONOSCOPY WITH PROPOFOL N/A 03/02/2016   Procedure: COLONOSCOPY WITH PROPOFOL;  Surgeon: RoManya SilvasMD;  [facility]  Service: Endoscopy;  Laterality: N/A;  . COLONOSCOPY WITH PROPOFOL N/A 09/26/2017   Procedure: COLONOSCOPY WITH PROPOFOL;  Surgeon: ElManya SilvasMD;  [facility];  Service: Endoscopy;  Laterality: N/A;  . CORONARY ANGIOPLASTY WITH STENT PLACEMENT     x3 stents  . CORONARY ANGIOPLASTY WITH STENT PLACEMENT    . CORONARY ARTERY BYPASS GRAFT    . ESOPHAGOGASTRODUODENOSCOPY (EGD) WITH PROPOFOL N/A 03/02/2016   Procedure: ESOPHAGOGASTRODUODENOSCOPY (EGD) WITH PROPOFOL;  Surgeon:  Manya Silvas, MD;  [facility];  Service: Endoscopy;  Laterality: N/A;  . ESOPHAGOGASTRODUODENOSCOPY (EGD) WITH PROPOFOL N/A 04/14/2019   Procedure: ESOPHAGOGASTRODUODENOSCOPY (EGD) WITH PROPOFOL;  Surgeon: Toledo, Benay Pike, MD;  Location: [name] ENDOSCOPY;  Service: Gastroenterology;  Laterality: N/A;  . LYMPH NODE BIOPSY Right 07-24-11   Dr Bary Castilla  . MOHS SURGERY     bilateral shoulders  . PTCA    . skin cancer removal    . TOTAL ABDOMINAL HYSTERECTOMY W/ BILATERAL SALPINGOOPHORECTOMY      There were no vitals filed for this visit.  Subjective Assessment - 10/23/19 1115    Subjective  Pt doing well, has bene wlaking this  morning. She reports legs being sore yesterday on account of floor to standing activitry last session. PCP visit coming up tomorrow.    Pertinent History  Patient is a pleasant 85 year old female who presents to physical therapy for imbalance. Per previous documentation patient has been having unsteadiness for several months and burning in bilateral LE's and feet since 2015 after having a heart attack. PMH includes A fib, MI s/p stent placement, lymphoma, osteoporosis, ASCVD, atherosclerosis, atrophic vaginitis, CHF, HTN, fibrocystic disease of breast, GERD, glaucoma, HLD, colonic polyps, HLD, hypothyroidism, migraine, stress incontinence, polyneuropathy. It has been since October since she saw the physician for right sided weakness. Is having some pain in right hip, buttocks, leg, and foot. Foot is stabbing when first gets up and aches in the back of knee. Usually stumbles around corners.    Currently in Pain?  No/denies      INTERVENTION THIS DATE -STS from chair hands free 1x10 -STS from box +2 airex pads 2x10 (17")  -FWD stepup, plastic 4" step, 1x8 blat, no UE support, 2 LOB -FWD stepup, floor to 5" airex foam 1x8 bilat, 1 LOB -Airex pad stationary marching 3x30sec  -10walk intervals with red pad in middle without speed change. 4x      PT Short Term Goals - 10/07/19 1120      PT SHORT TERM GOAL #1   Title  Patient will be independent in home exercise program to improve strength/mobility for better functional independence with ADLs.    Baseline  1/14: give next session 2/16: HEP compliant    Time  2    Period  Weeks    Status  Partially Met    Target Date  10/21/19        PT Long Term Goals - 10/07/19 1158      PT LONG TERM GOAL #1   Title  Patient will increase Berg Balance score by > 6 points (49/56)  to demonstrate decreased fall risk during functional activities.    Baseline  1/14: 43/56 2/16: 51/56    Time  8    Period  Weeks    Status  Achieved      PT LONG TERM GOAL  #2   Title  Patient will increase ABC scale score >80% to demonstrate better functional mobility and better confidence with ADLs    Baseline  1/14/ 49.4% 2/16: 57.5%    Time  8    Period  Weeks    Status  Partially Met    Target Date  10/30/19      PT LONG TERM GOAL #3   Title  Patient will increase dynamic gait index score to >19/24 as to demonstrate reduced fall risk and improved dynamic gait balance for better safety with community/home ambulation.    Baseline  1/14: 16/24  2/16: 19/24    Time  8    Period  Weeks    Status  Partially Met    Target Date  10/30/19      PT LONG TERM GOAL #4   Title  Patient will increase BLE gross strength to 4+/5 as to improve functional strength for independent gait, increased standing tolerance and increased ADL ability.    Baseline  1/14: R 3+/5, L 4-/5 2/16: R grossly 4-/5 L grossly 4-/5    Time  8    Period  Weeks    Status  Partially Met    Target Date  10/30/19            Plan - 10/23/19 1118    Clinical Impression Statement  Pt able to complete entire session as planned with rest breaks provided as needed. Pt maintains high level of focus and motivation. Extensive verbal, visual, and tactile cues are provided for most accurate form possible. Author provides minA intermittently for full ROM when needed. Overall pt continues to make steady progress toward treatment goals. Pt is nearing DC, reports she still is having trouble with stairs.    Comorbidities  A fib, MI s/p stent placement, lymphoma, osteoporosis, ASCVD, atherosclerosis, atrophic vaginitis, CHF, HTN, fibrocystic disease of breast, GERD, glaucoma, HLD, colonic polyps, HLD, hypothyroidism, migraine, stress incontinence, polyneuropathy    Stability/Clinical Decision Making  Evolving/Moderate complexity    Rehab Potential  Good    PT Frequency  2x / week    PT Duration  8 weeks    PT Treatment/Interventions  ADLs/Self Care Home Management;Aquatic Therapy;Biofeedback;Canalith  Repostioning;Cryotherapy;Electrical Stimulation;Moist Heat;Traction;Ultrasound;DME Instruction;Gait training;Stair training;Functional mobility training;Therapeutic activities;Therapeutic exercise;Balance training;Neuromuscular re-education;Patient/family education;Manual techniques;Passive range of motion;Dry needling;Energy conservation;Taping;Vestibular    PT Next Visit Plan  Continue with strengthening to improve floor to standing transitions; performance of stairs    PT Home Exercise Plan  Issued SLS and tandem stance    Consulted and Agree with Plan of Care  Patient       Patient will benefit from skilled therapeutic intervention in order to improve the following deficits and impairments:  Abnormal gait, Cardiopulmonary status limiting activity, Decreased activity tolerance, Decreased balance, Decreased endurance, Difficulty walking, Decreased strength, Postural dysfunction, Improper body mechanics  Visit Diagnosis: Unsteadiness on feet  Other abnormalities of gait and mobility  Muscle weakness (generalized)     Problem List Patient Active Problem List   Diagnosis Date Noted  . 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 H/O neoplasm 01/04/2015  . Arterial vascular disease 12/30/2013  . BP (high blood pressure) 12/30/2013  . Adult hypothyroidism 12/30/2013  . OP (osteoporosis) 12/30/2013  . Avitaminosis D 12/30/2013  . Atrial fibrillation ([place]) 12/30/2013  . HLD (hyperlipidemia) 12/30/2013  . Preoperative cardiovascular examination 04/07/2013  . Hyperlipidemia 04/07/2013  . A-fib ([place])   . Coronary artery disease   . Chronic systolic heart failure (HCC)    11:41 AM, 10/23/19 Etta Grandchild, PT, DPT Physical Therapist - [facility]  Outpatient Physical Therapy- [place] [phone]     Etta Grandchild 10/23/2019, 11:25 AM  [place] MAIN [facility] SERVICES [address]  [place], [place], [address] Phone: [phone]   Fax:  [phone]  Name: ROMAN SANDALL MRN: [id number] Date of Birth: January 24, 1935

## 2019-10-28 ENCOUNTER — Ambulatory Visit: Payer: Medicare Other | Admitting: Physical Therapy

## 2019-10-28 ENCOUNTER — Encounter: Payer: Self-pay | Admitting: Physical Therapy

## 2019-10-28 ENCOUNTER — Other Ambulatory Visit: Payer: Self-pay

## 2019-10-28 DIAGNOSIS — R2689 Other abnormalities of gait and mobility: Secondary | ICD-10-CM

## 2019-10-28 DIAGNOSIS — M6281 Muscle weakness (generalized): Secondary | ICD-10-CM

## 2019-10-28 DIAGNOSIS — R2681 Unsteadiness on feet: Secondary | ICD-10-CM

## 2019-10-28 NOTE — Therapy (Signed)
[place] MAIN [facility] SERVICES [address] [place], [place], [address] Phone: [phone]   Fax:  [phone]  Physical Therapy Treatment  Patient Details  Name: Jeanne Pittman MRN: [id number] Date of Birth: February 23, 1935 Referring Provider (PT): Jennings Books   Encounter Date: 10/28/2019  PT End of Session - 10/28/19 1105    Visit Number  14    Number of Visits  16    Date for PT Re-Evaluation  10/30/19    Authorization Type  2/10 PN 10/07/19    PT Start Time  1100    PT Stop Time  1140    PT Time Calculation (min)  40 min    Equipment Utilized During Treatment  Gait belt    Activity Tolerance  Patient tolerated treatment well;No increased pain    Behavior During Therapy  WFL for tasks assessed/performed       Past Medical History:  Diagnosis Date  . A-fib ([place])    07/2012: A. fib with RVR in the setting of myocardial infarction. Converted to sinus rhythm with amiodarone. No recurrence.  . Anxiety   . Atrophic vaginitis   . CHF (congestive heart failure) ([place])   . Chronic combined systolic (congestive) and diastolic (congestive) heart failure ([place])    a. EF was 25-35% post MI but improved to 45-50% in 04/2013; b. 03/2017 Echo: EF 40-45%, mod focal/basal hypertrophy of septum. Sev mid-apicalanteroseptal, ant, and apical HK. Gr1 DD, mild AI/MR, mod TR, PASP 54mg.  . Colon adenomas   . Coronary artery disease    a. 162/6948STEMI complicated by cardiogenic shock. Cath/PCI: LAD 1083mDESx2), RCA 95p(DES). EF 25%; b. 03/2017 MV: EF 57%, fixed apical, periapical, mid to distal anteroseptal defect. No ischemia.  . Eczema   . Fibrocystic breast disease   . Gastritis   . GERD (gastroesophageal reflux disease)   . Glaucoma   . Headache   . History of gastritis   . Hyperlipidemia   . Hypertension   . Hypothyroidism   . Insomnia   . Ischemic cardiomyopathy    a. 07/2012 EF 25-35% following MI-->Improved to 45-50%;  b. 03/2017 Echo: EF 40-45%.  . Marland KitchenLymphoma (HCAuburn  . Myocardial infarction (HCFloridatown12/29/2015  . Non Hodgkin's lymphoma (HCMay[place]2005   reoccurance 2007 and 2012  . Osteoarthritis   . Osteoporosis   . Polyneuropathy   . Polyposis of colon   . Restless leg syndrome   . Urinary, incontinence, stress female     Past Surgical History:  Procedure Laterality Date  . ABDOMINAL HYSTERECTOMY  1956  . APPENDECTOMY    . AXILLARY LYMPH NODE BIOPSY Left 03/18/2015   Procedure: AXILLARY LYMPH NODE BIOPSY;  Surgeon: JeRobert BellowMD;  Location: [name];  Service: General;  Laterality: Left;  . CARDIAC CATHETERIZATION  08/19/2012   [name]; [facility]  . COLONOSCOPY    . COLONOSCOPY WITH PROPOFOL N/A 03/02/2016   Procedure: COLONOSCOPY WITH PROPOFOL;  Surgeon: RoManya SilvasMD;  [facility];  Service: Endoscopy;  Laterality: N/A;  . COLONOSCOPY WITH PROPOFOL N/A 09/26/2017   Procedure: COLONOSCOPY WITH PROPOFOL;  Surgeon: ElManya SilvasMD;  [facility];  Service: Endoscopy;  Laterality: N/A;  . CORONARY ANGIOPLASTY WITH STENT PLACEMENT     x3 stents  . CORONARY ANGIOPLASTY WITH STENT PLACEMENT    . CORONARY ARTERY BYPASS GRAFT    . ESOPHAGOGASTRODUODENOSCOPY (EGD) WITH PROPOFOL N/A 03/02/2016   Procedure: ESOPHAGOGASTRODUODENOSCOPY (EGD) WITH PROPOFOL;  Surgeon:  Manya Silvas, MD;  Location: [facility] ENDOSCOPY;  Service: Endoscopy;  Laterality: N/A;  . ESOPHAGOGASTRODUODENOSCOPY (EGD) WITH PROPOFOL N/A 04/14/2019   Procedure: ESOPHAGOGASTRODUODENOSCOPY (EGD) WITH PROPOFOL;  Surgeon: Toledo, Benay Pike, MD;  Location: [name] ENDOSCOPY;  Service: Gastroenterology;  Laterality: N/A;  . LYMPH NODE BIOPSY Right 07-24-11   Dr Bary Castilla  . MOHS SURGERY     bilateral shoulders  . PTCA    . skin cancer removal    . TOTAL ABDOMINAL HYSTERECTOMY W/ BILATERAL SALPINGOOPHORECTOMY      There were no vitals filed for this visit.  Subjective Assessment - 10/28/19 1104    Subjective  Pt doing well, has bene wlaking this  morning. She reports that her children were visiting and she is a bit tired.    Pertinent History  Patient is a pleasant 85 year old female who presents to physical therapy for imbalance. Per previous documentation patient has been having unsteadiness for several months and burning in bilateral LE's and feet since 2015 after having a heart attack. PMH includes A fib, MI s/p stent placement, lymphoma, osteoporosis, ASCVD, atherosclerosis, atrophic vaginitis, CHF, HTN, fibrocystic disease of breast, GERD, glaucoma, HLD, colonic polyps, HLD, hypothyroidism, migraine, stress incontinence, polyneuropathy. It has been since October since she saw the physician for right sided weakness. Is having some pain in right hip, buttocks, leg, and foot. Foot is stabbing when first gets up and aches in the back of knee. Usually stumbles around corners.    Limitations  Lifting;Standing;Walking;House hold activities    How long can you sit comfortably?  all day    How long can you stand comfortably?  gets fatigued    How long can you walk comfortably?  2 blocks    Patient Stated Goals  to walk further    Currently in Pain?  No/denies    Pain Score  0-No pain    Pain Onset  In the past 7 days       Neuromuscular Re-education   Tandem stand on 1/2 foam flat side up and flat side down, without UE support x 2 lengths Side stepping on foam  without UE support x 2 lengths Heel/toe raises without UE support 3s hold x 10 each 1/2 foam roll balance with flat side up 30s x 2 reps 1/2 foam roll balance with flat side down 30s x 2 reps 1/2 foam roll tandem balance alternating forward LE 30s x 2 each LE forward Lateral side steps from foam to 6 inch stool left and right x 15 Fwd stepping from foam to 6 inch stool x 15  Leg press 25 lbs x 20 x 3  Stepping over the hurdle fwd/bwd, side to side x 20  Matrix fwd. Bwd, side to side x 3     Pt educated throughout session about proper posture and technique with exercises.  Improved exercise technique, movement at target joints, use of target muscles after min to mod verbal, visual, tactile cues. CGA and Min to mod verbal cues used throughout with increased in postural sway and LOB most seen with narrow base of support and while on uneven surfaces. Continues to have balance deficits typical with diagnosis. Patient performs intermediate level exercises without pain behaviors and needs verbal cuing for postural alignment and head positioning Tactile cues and assistance needed to keep lower leg and knee in neutral to avoid compensations with ankle motions.  PT Education - 10/28/19 1105    Education Details  HEP    Person(s) Educated  Patient    Methods  Explanation    Comprehension  Verbalized understanding;Verbal cues required       PT Short Term Goals - 10/07/19 1120      PT SHORT TERM GOAL #1   Title  Patient will be independent in home exercise program to improve strength/mobility for better functional independence with ADLs.    Baseline  1/14: give next session 2/16: HEP compliant    Time  2    Period  Weeks    Status  Partially Met    Target Date  10/21/19        PT Long Term Goals - 10/07/19 1158      PT LONG TERM GOAL #1   Title  Patient will increase Berg Balance score by > 6 points (49/56)  to demonstrate decreased fall risk during functional activities.    Baseline  1/14: 43/56 2/16: 51/56    Time  8    Period  Weeks    Status  Achieved      PT LONG TERM GOAL #2   Title  Patient will increase ABC scale score >80% to demonstrate better functional mobility and better confidence with ADLs    Baseline  1/14/ 49.4% 2/16: 57.5%    Time  8    Period  Weeks    Status  Partially Met    Target Date  10/30/19      PT LONG TERM GOAL #3   Title  Patient will increase dynamic gait index score to >19/24 as to demonstrate reduced fall risk and improved dynamic gait balance for better safety with  community/home ambulation.    Baseline  1/14: 16/24 2/16: 19/24    Time  8    Period  Weeks    Status  Partially Met    Target Date  10/30/19      PT LONG TERM GOAL #4   Title  Patient will increase BLE gross strength to 4+/5 as to improve functional strength for independent gait, increased standing tolerance and increased ADL ability.    Baseline  1/14: R 3+/5, L 4-/5 2/16: R grossly 4-/5 L grossly 4-/5    Time  8    Period  Weeks    Status  Partially Met    Target Date  10/30/19            Plan - 10/28/19 1105    Clinical Impression Statement  Patient demonstrates deficits with postural control in tandem and tandem stance on 1/2 foam. Patient demonstrated hesitation with full weight shifting and on uneven surfaces but minimal cueing resulted in good technique and no LOB.  Patient improved ability to challenge dynamic balance with supervision and with UE assist today.   Patient will continue to benefit from skilled physical therapy to improve endurance and dynamic balance to reduce fall risk    Comorbidities  A fib, MI s/p stent placement, lymphoma, osteoporosis, ASCVD, atherosclerosis, atrophic vaginitis, CHF, HTN, fibrocystic disease of breast, GERD, glaucoma, HLD, colonic polyps, HLD, hypothyroidism, migraine, stress incontinence, polyneuropathy    Stability/Clinical Decision Making  Evolving/Moderate complexity    Rehab Potential  Good    PT Frequency  2x / week    PT Duration  8 weeks    PT Treatment/Interventions  ADLs/Self Care Home Management;Aquatic Therapy;Biofeedback;Canalith Repostioning;Cryotherapy;Electrical Stimulation;Moist Heat;Traction;Ultrasound;DME Instruction;Gait training;Stair training;Functional mobility training;Therapeutic activities;Therapeutic exercise;Balance training;Neuromuscular re-education;Patient/family education;Manual techniques;Passive range of  motion;Dry needling;Energy conservation;Taping;Vestibular    PT Next Visit Plan  Continue with  strengthening to improve floor to standing transitions; performance of stairs    PT Home Exercise Plan  Issued SLS and tandem stance    Consulted and Agree with Plan of Care  Patient       Patient will benefit from skilled therapeutic intervention in order to improve the following deficits and impairments:  Abnormal gait, Cardiopulmonary status limiting activity, Decreased activity tolerance, Decreased balance, Decreased endurance, Difficulty walking, Decreased strength, Postural dysfunction, Improper body mechanics  Visit Diagnosis: Unsteadiness on feet  Other abnormalities of gait and mobility  Muscle weakness (generalized)     Problem List Patient Active Problem List   Diagnosis Date Noted  . 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 H/O neoplasm 01/04/2015  . Arterial vascular disease 12/30/2013  . BP (high blood pressure) 12/30/2013  . Adult hypothyroidism 12/30/2013  . OP (osteoporosis) 12/30/2013  . Avitaminosis D 12/30/2013  . Atrial fibrillation ([place]) 12/30/2013  . HLD (hyperlipidemia) 12/30/2013  . Preoperative cardiovascular examination 04/07/2013  . Hyperlipidemia 04/07/2013  . A-fib ([place])   . Coronary artery disease   . Chronic systolic heart failure [facility])     Alanson Puls, [place] DPT 10/28/2019, 11:06 AM  [place] MAIN [facility] SERVICES [address] [place], [place], [address] Phone: [phone]   Fax:  [phone]  Name: Jeanne Pittman MRN: [id number] Date of Birth: [birth year]

## 2019-10-30 ENCOUNTER — Ambulatory Visit: Payer: Medicare Other | Admitting: Physical Therapy

## 2019-10-30 ENCOUNTER — Encounter: Payer: Self-pay | Admitting: Physical Therapy

## 2019-10-30 ENCOUNTER — Other Ambulatory Visit: Payer: Self-pay

## 2019-10-30 DIAGNOSIS — R2681 Unsteadiness on feet: Secondary | ICD-10-CM

## 2019-10-30 DIAGNOSIS — R2689 Other abnormalities of gait and mobility: Secondary | ICD-10-CM

## 2019-10-30 DIAGNOSIS — M6281 Muscle weakness (generalized): Secondary | ICD-10-CM

## 2019-10-30 NOTE — Therapy (Addendum)
[place] MAIN [facility] SERVICES [address] [place], [place], [address] Phone: [phone]   Fax:  [phone]  Physical Therapy Treatment  Patient Details  Name: Jeanne Pittman MRN: [id number] Date of Birth: 07-May-1935 Referring Provider (PT): Jennings Books   Encounter Date: 10/30/2019  PT End of Session - 10/30/19 1228    Visit Number  15    Number of Visits  16    Date for PT Re-Evaluation  10/30/19    Authorization Type  2/10 PN 10/07/19    PT Start Time  1150    PT Stop Time  1228    PT Time Calculation (min)  38 min    Equipment Utilized During Treatment  Gait belt    Activity Tolerance  Patient tolerated treatment well;No increased pain    Behavior During Therapy  WFL for tasks assessed/performed       Past Medical History:  Diagnosis Date  . A-fib ([place])    07/2012: A. fib with RVR in the setting of myocardial infarction. Converted to sinus rhythm with amiodarone. No recurrence.  . Anxiety   . Atrophic vaginitis   . CHF (congestive heart failure) ([place])   . Chronic combined systolic (congestive) and diastolic (congestive) heart failure ([place])    a. EF was 25-35% post MI but improved to 45-50% in 04/2013; b. 03/2017 Echo: EF 40-45%, mod focal/basal hypertrophy of septum. Sev mid-apicalanteroseptal, ant, and apical HK. Gr1 DD, mild AI/MR, mod TR, PASP 31mg.  . Colon adenomas   . Coronary artery disease    a. 179/3903STEMI complicated by cardiogenic shock. Cath/PCI: LAD 1041mDESx2), RCA 95p(DES). EF 25%; b. 03/2017 MV: EF 57%, fixed apical, periapical, mid to distal anteroseptal defect. No ischemia.  . Eczema   . Fibrocystic breast disease   . Gastritis   . GERD (gastroesophageal reflux disease)   . Glaucoma   . Headache   . History of gastritis   . Hyperlipidemia   . Hypertension   . Hypothyroidism   . Insomnia   . Ischemic cardiomyopathy    a. 07/2012 EF 25-35% following MI-->Improved to 45-50%;  b. 03/2017 Echo: EF 40-45%.  . Marland KitchenLymphoma ([facility] [place]  . Myocardial infarction ([place]12/29/2015  . Non Hodgkin's lymphoma ([place]2005   reoccurance 2007 and 2012  . Osteoarthritis   . Osteoporosis   . Polyneuropathy   . Polyposis of colon   . Restless leg syndrome   . Urinary, incontinence, stress female     Past Surgical History:  Procedure Laterality Date  . ABDOMINAL HYSTERECTOMY  1956  . APPENDECTOMY    . AXILLARY LYMPH NODE BIOPSY Left 03/18/2015   Procedure: AXILLARY LYMPH NODE BIOPSY;  Surgeon: JeRobert BellowMD;  Location: [name];  Service: General;  Laterality: Left;  . CARDIAC CATHETERIZATION  08/19/2012   [name]; [place]  . COLONOSCOPY    . COLONOSCOPY WITH PROPOFOL N/A 03/02/2016   Procedure: COLONOSCOPY WITH PROPOFOL;  Surgeon: RoManya SilvasMD;  [facility];  Service: Endoscopy;  Laterality: N/A;  . COLONOSCOPY WITH PROPOFOL N/A 09/26/2017   Procedure: COLONOSCOPY WITH PROPOFOL;  Surgeon: ElManya SilvasMD;  [facility];  Service: Endoscopy;  Laterality: N/A;  . CORONARY ANGIOPLASTY WITH STENT PLACEMENT     x3 stents  . CORONARY ANGIOPLASTY WITH STENT PLACEMENT    . CORONARY ARTERY BYPASS GRAFT    . ESOPHAGOGASTRODUODENOSCOPY (EGD) WITH PROPOFOL N/A 03/02/2016   Procedure: ESOPHAGOGASTRODUODENOSCOPY (EGD) WITH PROPOFOL;  Surgeon:  Manya Silvas, MD;  Location: [facility] ENDOSCOPY;  Service: Endoscopy;  Laterality: N/A;  . ESOPHAGOGASTRODUODENOSCOPY (EGD) WITH PROPOFOL N/A 04/14/2019   Procedure: ESOPHAGOGASTRODUODENOSCOPY (EGD) WITH PROPOFOL;  Surgeon: Toledo, Benay Pike, MD;  Location: [name] ENDOSCOPY;  Service: Gastroenterology;  Laterality: N/A;  . LYMPH NODE BIOPSY Right 07-24-11   Dr Bary Castilla  . MOHS SURGERY     bilateral shoulders  . PTCA    . skin cancer removal    . TOTAL ABDOMINAL HYSTERECTOMY W/ BILATERAL SALPINGOOPHORECTOMY      There were no vitals filed for this visit.  Subjective Assessment - 10/30/19 1646    Subjective  Pt doing well, no new complaints     Currently in Pain?  No/denies    Pain Score  0-No pain       Neuromuscular Re-education  Rocker board fwd/bwd, side to side x 20 each direction Tandem gait on 2"x4" without UE support x 2 lengths Side stepping on 2"x4" without UE support x 2 lengths Heel/toe raises without UE support 3s hold x 10 each 1/2 foam roll balance with flat side up 30s x 2 reps 1/2 foam roll balance with flat side down 30s x 2 reps 1/2 foam roll tandem balance alternating forward LE 30s x 2 each LE forward Lateral side steps from foam to 6 inch stool left and right x 15 Backwards stepping from foam to 6 inch stool x 15  matrix fwd/bwd and side to side x 3 reps 22.5 lbs      Pt educated throughout session about proper posture and technique with exercises. Improved exercise technique, movement at target joints, use of target muscles after min to mod verbal, visual, tactile cues. CGA and Min to mod verbal cues used throughout with increased in postural sway and LOB most seen with narrow base of support and while on uneven surfaces. Continues to have balance deficits typical with diagnosis.     PT Education - 10/30/19 1227    Education Details  HEP    Person(s) Educated  Patient    Methods  Explanation    Comprehension  Verbalized understanding;Need further instruction       PT Short Term Goals - 10/07/19 1120      PT SHORT TERM GOAL #1   Title  Patient will be independent in home exercise program to improve strength/mobility for better functional independence with ADLs.    Baseline  1/14: give next session 2/16: HEP compliant    Time  2    Period  Weeks    Status  Partially Met    Target Date  10/21/19        PT Long Term Goals - 10/30/19 1720      PT LONG TERM GOAL #1   Title  Patient will increase Berg Balance score by > 6 points (49/56)  to demonstrate decreased fall risk during functional activities.    Baseline  1/14: 43/56 2/16: 51/56,    Time  8    Period  Weeks    Status  Achieved       PT LONG TERM GOAL #2   Title  Patient will increase ABC scale score >80% to demonstrate better functional mobility and better confidence with ADLs    Baseline  1/14/ 49.4% 2/16: 57.5%, 10/30/19=62%    Time  8    Period  Weeks    Status  Partially Met    Target Date  12/25/19      PT LONG TERM GOAL #3  Title  Patient will increase dynamic gait index score to >19/24 as to demonstrate reduced fall risk and improved dynamic gait balance for better safety with community/home ambulation.    Baseline  1/14: 16/24 2/16: 19/24, 10/30/19= 20/24    Time  8    Period  Weeks    Status  Partially Met    Target Date  12/25/19      PT LONG TERM GOAL #4   Title  Patient will increase BLE gross strength to 4+/5 as to improve functional strength for independent gait, increased standing tolerance and increased ADL ability.    Baseline  1/14: R 3+/5, L 4-/5 2/16: R grossly 4-/5 L grossly 4-/5, 10/30/19=R 3+/5, L 4-/5 2/16: R grossly 4-/5 L grossly 4-/5,    Time  8    Period  Weeks    Status  Partially Met    Target Date  12/25/19            Plan - 10/30/19 1647    Clinical Impression Statement  Patient instructed in intermediate balance/strengthening exercise. . Patient instructed in advanced strengthening with increased repetition/resistance. Patient requires CGA for intermediate balance exercise especially with less rail assist. Patient would benefit from additional skilled PT intervention to improve balance/gait safety and reduce fall risk.    Comorbidities  A fib, MI s/p stent placement, lymphoma, osteoporosis, ASCVD, atherosclerosis, atrophic vaginitis, CHF, HTN, fibrocystic disease of breast, GERD, glaucoma, HLD, colonic polyps, HLD, hypothyroidism, migraine, stress incontinence, polyneuropathy    Stability/Clinical Decision Making  Evolving/Moderate complexity    Rehab Potential  Good    PT Frequency  2x / week    PT Duration  8 weeks    PT Treatment/Interventions  ADLs/Self Care Home  Management;Aquatic Therapy;Biofeedback;Canalith Repostioning;Cryotherapy;Electrical Stimulation;Moist Heat;Traction;Ultrasound;DME Instruction;Gait training;Stair training;Functional mobility training;Therapeutic activities;Therapeutic exercise;Balance training;Neuromuscular re-education;Patient/family education;Manual techniques;Passive range of motion;Dry needling;Energy conservation;Taping;Vestibular    PT Next Visit Plan  Continue with strengthening to improve floor to standing transitions; performance of stairs    PT Home Exercise Plan  Issued SLS and tandem stance    Consulted and Agree with Plan of Care  Patient       Patient will benefit from skilled therapeutic intervention in order to improve the following deficits and impairments:  Abnormal gait, Cardiopulmonary status limiting activity, Decreased activity tolerance, Decreased balance, Decreased endurance, Difficulty walking, Decreased strength, Postural dysfunction, Improper body mechanics  Visit Diagnosis: Unsteadiness on feet  Other abnormalities of gait and mobility  Muscle weakness (generalized)     Problem List Patient Active Problem List   Diagnosis Date Noted  . 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 H/O neoplasm 01/04/2015  . Arterial vascular disease 12/30/2013  . BP (high blood pressure) 12/30/2013  . Adult hypothyroidism 12/30/2013  . OP (osteoporosis) 12/30/2013  . Avitaminosis D 12/30/2013  . Atrial fibrillation ([place]) 12/30/2013  . HLD (hyperlipidemia) 12/30/2013  . Preoperative cardiovascular examination 04/07/2013  . Hyperlipidemia 04/07/2013  . A-fib ([place])   . Coronary artery disease   . Chronic systolic heart failure [facility])     Alanson Puls, [place] DPT 10/30/2019, 5:23 PM  [place] MAIN [facility] SERVICES [address] [place], [place], [address] Phone: [phone]   Fax:  [phone]  Name: Jeanne Pittman MRN:  [id number] Date of Birth: 14-Nov-1934

## 2019-10-30 NOTE — Addendum Note (Signed)
Addended by: Alanson Puls on: 10/30/2019 05:25 PM   Modules accepted: Orders

## 2019-11-03 ENCOUNTER — Ambulatory Visit
Admission: RE | Admit: 2019-11-03 | Discharge: 2019-11-03 | Disposition: A | Discharge status: Home / Self Care | Payer: Medicare Other | Source: Ambulatory Visit | Attending: Internal Medicine | Admitting: Internal Medicine

## 2019-11-03 DIAGNOSIS — Z1231 Encounter for screening mammogram for malignant neoplasm of breast: Secondary | ICD-10-CM | POA: Insufficient documentation

## 2019-11-04 ENCOUNTER — Ambulatory Visit: Payer: Medicare Other | Admitting: Physical Therapy

## 2019-11-06 ENCOUNTER — Other Ambulatory Visit: Payer: Self-pay

## 2019-11-06 MED ORDER — CARVEDILOL 3.125 MG PO TABS: ORAL_TABLET | ORAL | 3 refills | Status: DC

## 2019-11-11 ENCOUNTER — Other Ambulatory Visit: Payer: Self-pay

## 2019-11-11 ENCOUNTER — Encounter: Payer: Self-pay | Admitting: Physical Therapy

## 2019-11-11 ENCOUNTER — Encounter: Payer: Self-pay | Admitting: Cardiovascular Disease

## 2019-11-11 ENCOUNTER — Ambulatory Visit: Payer: Medicare Other | Admitting: Physical Therapy

## 2019-11-11 ENCOUNTER — Ambulatory Visit (INDEPENDENT_AMBULATORY_CARE_PROVIDER_SITE_OTHER): Payer: Medicare Other | Admitting: Cardiovascular Disease

## 2019-11-11 VITALS — BP 140/60 | HR 69 | Ht 61.5 in | Wt 105.1 lb

## 2019-11-11 DIAGNOSIS — R2681 Unsteadiness on feet: Secondary | ICD-10-CM | POA: Diagnosis not present

## 2019-11-11 DIAGNOSIS — I5022 Chronic systolic (congestive) heart failure: Secondary | ICD-10-CM | POA: Diagnosis not present

## 2019-11-11 DIAGNOSIS — E785 Hyperlipidemia, unspecified: Secondary | ICD-10-CM

## 2019-11-11 DIAGNOSIS — M6281 Muscle weakness (generalized): Secondary | ICD-10-CM

## 2019-11-11 DIAGNOSIS — I25118 Atherosclerotic heart disease of native coronary artery with other forms of angina pectoris: Secondary | ICD-10-CM | POA: Diagnosis not present

## 2019-11-11 DIAGNOSIS — R2689 Other abnormalities of gait and mobility: Secondary | ICD-10-CM

## 2019-11-11 NOTE — Progress Notes (Signed)
Cardiology Office Note   Date:  11/11/2019   ID:  Jeanne Pittman, DOB 1934/09/14, MRN [id number]  PCP:  [name], MD  Cardiologist:   Kathlyn Sacramento, MD   Chief Complaint  Patient presents with  . office visit    2 month F/U; Meds verbally reviewed with patient.      History of Present Illness: Jeanne Pittman is a 85 y.o. female who presents for a followup visit regarding coronary artery disease and chronic systolic heart failure due to ischemic cardiomyopathy. She had anterior ST elevation myocardial infarction in December 2013 with late presentation complicated by cardiogenic shock. Emergent cardiac catheterization showed an occluded mid LAD and 95% stenosis in the proximal RCA. 2 drug-eluting stents were placed in the mid LAD and 1 drug-eluting stent to the proximal RCA. Ejection fraction was 25-30% with akinesis of the mid distal anterior, apical and distal inferior wall.  She has known history of non-Hodgkin's lymphoma which is being observed. She has known history of myalgia with atorvastatin but she has been tolerating rosuvastatin.  Previous Leane Call was  in August 2018 showed evidence of prior anterior infarct without significant ischemia.  She was seen few months ago for increased fatigue and shortness of breath.  She underwent an echocardiogram which showed stable EF of 40 to 45%.  Lexiscan Myoview showed mildly reduced ejection fraction with fixed anterior apical defect consistent with prior infarct with minimal peri-infarct ischemia.  No significant change from previous test in 2018.  She continues to be mostly limited by low back pain and currently getting physical therapy.  She reports that her fatigue and exertional dyspnea are both stable.  Past Medical History:  Diagnosis Date  . A-fib ([place])    07/2012: A. fib with RVR in the setting of myocardial infarction. Converted to sinus rhythm with amiodarone. No recurrence.  . Anxiety   . Atrophic  vaginitis   . CHF (congestive heart failure) ([place])   . Chronic combined systolic (congestive) and diastolic (congestive) heart failure ([place])    a. EF was 25-35% post MI but improved to 45-50% in 04/2013; b. 03/2017 Echo: EF 40-45%, mod focal/basal hypertrophy of septum. Sev mid-apicalanteroseptal, ant, and apical HK. Gr1 DD, mild AI/MR, mod TR, PASP 37mHg.  . Colon adenomas   . Coronary artery disease    a. [id number] STEMI complicated by cardiogenic shock. Cath/PCI: LAD 117m (DESx2), RCA 95p(DES). EF 25%; b. 03/2017 MV: EF 57%, fixed apical, periapical, mid to distal anteroseptal defect. No ischemia.  . Eczema   . Fibrocystic breast disease   . Gastritis   . GERD (gastroesophageal reflux disease)   . Glaucoma   . Headache   . History of gastritis   . Hyperlipidemia   . Hypertension   . Hypothyroidism   . Insomnia   . Ischemic cardiomyopathy    a. 07/2012 EF 25-35% following MI-->Improved to 45-50%;  b. 03/2017 Echo: EF 40-45%.  [name] Lymphoma ([place])   . Myocardial infarction ([place]) 08/18/2014  . Non Hodgkin's lymphoma ([place]) 2005   reoccurance 2007 and 2012  . Osteoarthritis   . Osteoporosis   . Polyneuropathy   . Polyposis of colon   . Restless leg syndrome   . Urinary, incontinence, stress female     Past Surgical History:  Procedure Laterality Date  . ABDOMINAL HYSTERECTOMY  1956  . APPENDECTOMY    . AXILLARY LYMPH NODE BIOPSY Left 03/18/2015   Procedure: AXILLARY LYMPH NODE BIOPSY;  Surgeon: Forest Gleason  Bary Castilla, MD;  Location: [name];  Service: General;  Laterality: Left;  . CARDIAC CATHETERIZATION  08/19/2012   [name]; [place]  . COLONOSCOPY    . COLONOSCOPY WITH PROPOFOL N/A 03/02/2016   Procedure: COLONOSCOPY WITH PROPOFOL;  Surgeon: Manya Silvas, MD;  [facility]COPY;  Service: Endoscopy;  Laterality: N/A;  . COLONOSCOPY WITH PROPOFOL N/A 09/26/2017   Procedure: COLONOSCOPY WITH PROPOFOL;  Surgeon: Manya Silvas, MD;  Location: [facility] ENDOSCOPY;  Service: Endoscopy;   Laterality: N/A;  . CORONARY ANGIOPLASTY WITH STENT PLACEMENT     x3 stents  . CORONARY ANGIOPLASTY WITH STENT PLACEMENT    . CORONARY ARTERY BYPASS GRAFT    . ESOPHAGOGASTRODUODENOSCOPY (EGD) WITH PROPOFOL N/A 03/02/2016   Procedure: ESOPHAGOGASTRODUODENOSCOPY (EGD) WITH PROPOFOL;  Surgeon: Manya Silvas, MD;  Location: [facility] ENDOSCOPY;  Service: Endoscopy;  Laterality: N/A;  . ESOPHAGOGASTRODUODENOSCOPY (EGD) WITH PROPOFOL N/A 04/14/2019   Procedure: ESOPHAGOGASTRODUODENOSCOPY (EGD) WITH PROPOFOL;  Surgeon: Toledo, Benay Pike, MD;  Location: [name] ENDOSCOPY;  Service: Gastroenterology;  Laterality: N/A;  . LYMPH NODE BIOPSY Right 07-24-11   Dr Bary Castilla  . MOHS SURGERY     bilateral shoulders  . PTCA    . skin cancer removal    . TOTAL ABDOMINAL HYSTERECTOMY W/ BILATERAL SALPINGOOPHORECTOMY       Current Outpatient Medications  Medication Sig Dispense Refill  . acetaminophen (TYLENOL) 650 MG CR tablet Take 650 mg by mouth every 8 (eight) hours as needed for pain.    [name] aspirin 81 MG tablet Take 81 mg by mouth every morning.     . carvedilol (COREG) 3.125 MG tablet TAKE 1 TABLET BY MOUTH TWICE DAILY WITH A MEAL 60 tablet 3  . Cholecalciferol (VITAMIN D-3) 5000 UNITS TABS Take 2,000 Int'l Units by mouth every morning.     . fexofenadine (ALLEGRA) 180 MG tablet Take 180 mg by mouth daily as needed for rhinitis.     . furosemide (LASIX) 20 MG tablet Take 1 tablet (20 mg total) by mouth daily as needed. 30 tablet 6  . gabapentin (NEURONTIN) 300 MG capsule Take 300 mg by mouth as needed.    . latanoprost (XALATAN) 0.005 % ophthalmic solution Place 1 drop into both eyes at bedtime.     [name] LORazepam (ATIVAN) 0.5 MG tablet Take 0.5 mg by mouth every 8 (eight) hours as needed for anxiety.     [name] losartan (COZAAR) 25 MG tablet TAKE 1/2 TABLET(12.5 MG) BY MOUTH DAILY AFTER BREAKFAST 45 tablet 3  . pantoprazole (PROTONIX) 40 MG tablet TAKE 1 TABLET(40 MG) BY MOUTH TWICE DAILY 60 tablet 1  . rosuvastatin  (CRESTOR) 10 MG tablet TAKE 1 TABLET(10 MG) BY MOUTH EVERY OTHER DAY 45 tablet 3  . timolol (TIMOPTIC) 0.5 % ophthalmic solution Place 1 drop into both eyes 2 (two) times daily.      No current facility-administered medications for this visit.    Allergies:   Ace inhibitors, Benadryl [diphenhydramine hcl], Codeine, Diphenhydramine, Guaiacol, Hydrocodone, and Guaifenesin & derivatives    Social History:  The patient  reports that she quit smoking about 71 years ago. Her smoking use included cigarettes. She has never used smokeless tobacco. She reports that she does not drink alcohol or use drugs.   Family History:  The patient's family history includes Heart attack in her father; Heart disease in her brother; Leukemia in her mother.    ROS:  Please see the history of present illness.   Otherwise, review of systems are positive  for none.   All other systems are reviewed and negative.    PHYSICAL EXAM: VS:  BP 140/60 (BP Location: Left Arm, Patient Position: Sitting, Cuff Size: Normal)   Pulse 69   Ht 5' 1.5" (1.562 m)   Wt 105 lb 2 oz (47.7 kg)   SpO2 95%   BMI 19.54 kg/m  , BMI Body mass index is 19.54 kg/m. GEN: Well nourished, well developed, in no acute distress  HEENT: normal  Neck: no JVD, carotid bruits, or masses Cardiac: RRR; no murmurs, rubs, or gallops,no edema  Respiratory:  clear to auscultation bilaterally, normal work of breathing GI: soft, nontender, nondistended, + BS MS: no deformity or atrophy  Skin: warm and dry, no rash Neuro:  Strength and sensation are intact Psych: euthymic mood, full affect Distal pulses are palpable and normal.  Femoral pulses normal bilaterally  EKG:  EKG is ordered today. The ekg ordered today demonstrates normal sinus rhythm with old anterior infarct.  Low voltage  Recent Labs: 08/29/2019: ALT 10; BUN 30; Creatinine, Ser 0.92; Hemoglobin 12.1; Platelets 133; Potassium 4.4; Sodium 138    Lipid Panel    Component Value Date/Time     CHOL 172 09/02/2015 0824   TRIG 111 09/02/2015 0824   HDL 51 09/02/2015 0824   CHOLHDL 3.4 09/02/2015 0824   LDLCALC 99 09/02/2015 0824      Wt Readings from Last 3 Encounters:  11/11/19 105 lb 2 oz (47.7 kg)  09/09/19 103 lb (46.7 kg)  08/29/19 101 lb 8 oz (46 kg)        ASSESSMENT AND PLAN:  1.  Coronary artery disease involving native coronary arteries with other forms of angina: She reports stable exertional dyspnea and fatigue.   Although this could be angina equivalent, recent echocardiogram and Lexiscan Myoview did not look any different from before.  Shortness of breath is likely multifactorial with an element of physical deconditioning.  I again discussed with her the option of proceeding with left heart catheterization for definitive answer.  However, she feels that her symptoms have been overall stable.  Continue medical therapy for now.  2. Hyperlipidemia: She is tolerating rosuvastatin.  I reviewed most recent lipid profile in August which showed an LDL of 70 and triglyceride of 106.  3. Ischemic cardiomyopathy: Most recent ejection fraction was 40-45 %. Continue low-dose carvedilol and losartan. She is euvolemic.  4.  Back pain: Does not seem to be vascular in etiology.  Her femoral and distal pulses are relatively normal.   Disposition:   FU with me in 6 months  Signed,  Kathlyn Sacramento, MD  11/11/2019 3:37 PM    [facility]

## 2019-11-11 NOTE — Patient Instructions (Signed)

## 2019-11-11 NOTE — Therapy (Signed)
[place] MAIN [facility] SERVICES [address] [place], [place], [address] Phone: [phone]   Fax:  [phone]  Physical Therapy Treatment  Patient Details  Name: Jeanne Pittman MRN: [id number] Date of Birth: 1935/08/13 Referring Provider (PT): Jennings Books   Encounter Date: 11/11/2019  PT End of Session - 11/11/19 1205    Visit Number  16    Number of Visits  16    Date for PT Re-Evaluation  10/30/19    Authorization Type  2/10 PN 10/07/19    PT Start Time  1145    PT Stop Time  1225    PT Time Calculation (min)  40 min    Equipment Utilized During Treatment  Gait belt    Activity Tolerance  Patient tolerated treatment well;No increased pain    Behavior During Therapy  WFL for tasks assessed/performed       Past Medical History:  Diagnosis Date  . A-fib ([place])    07/2012: A. fib with RVR in the setting of myocardial infarction. Converted to sinus rhythm with amiodarone. No recurrence.  . Anxiety   . Atrophic vaginitis   . CHF (congestive heart failure) ([place])   . Chronic combined systolic (congestive) and diastolic (congestive) heart failure ([place])    a. EF was 25-35% post MI but improved to 45-50% in 04/2013; b. 03/2017 Echo: EF 40-45%, mod focal/basal hypertrophy of septum. Sev mid-apicalanteroseptal, ant, and apical HK. Gr1 DD, mild AI/MR, mod TR, PASP 52mg.  . Colon adenomas   . Coronary artery disease    a. 189/1694STEMI complicated by cardiogenic shock. Cath/PCI: LAD 1073mDESx2), RCA 95p(DES). EF 25%; b. 03/2017 MV: EF 57%, fixed apical, periapical, mid to distal anteroseptal defect. No ischemia.  . Eczema   . Fibrocystic breast disease   . Gastritis   . GERD (gastroesophageal reflux disease)   . Glaucoma   . Headache   . History of gastritis   . Hyperlipidemia   . Hypertension   . Hypothyroidism   . Insomnia   . Ischemic cardiomyopathy    a. 07/2012 EF 25-35% following MI-->Improved to 45-50%;  b. 03/2017 Echo: EF 40-45%.  . Marland KitchenLymphoma (HCTreasure  . Myocardial infarction (HCHarr[place]12/29/2015  . Non Hodgkin's lymphoma ([place]2005   reoccurance 2007 and 2012  . Osteoarthritis   . Osteoporosis   . Polyneuropathy   . Polyposis of colon   . Restless leg syndrome   . Urinary, incontinence, stress female     Past Surgical History:  Procedure Laterality Date  . ABDOMINAL HYSTERECTOMY  1956  . APPENDECTOMY    . AXILLARY LYMPH NODE BIOPSY Left 03/18/2015   Procedure: AXILLARY LYMPH NODE BIOPSY;  Surgeon: JeRobert BellowMD;  Location: [name];  Service: General;  Laterality: Left;  . CARDIAC CATHETERIZATION  08/19/2012   [name]; [facility]  . COLONOSCOPY    . COLONOSCOPY WITH PROPOFOL N/A 03/02/2016   Procedure: COLONOSCOPY WITH PROPOFOL;  Surgeon: RoManya SilvasMD;  [facility];  Service: Endoscopy;  Laterality: N/A;  . COLONOSCOPY WITH PROPOFOL N/A 09/26/2017   Procedure: COLONOSCOPY WITH PROPOFOL;  Surgeon: ElManya SilvasMD;  [facility];  Service: Endoscopy;  Laterality: N/A;  . CORONARY ANGIOPLASTY WITH STENT PLACEMENT     x3 stents  . CORONARY ANGIOPLASTY WITH STENT PLACEMENT    . CORONARY ARTERY BYPASS GRAFT    . ESOPHAGOGASTRODUODENOSCOPY (EGD) WITH PROPOFOL N/A 03/02/2016   Procedure: ESOPHAGOGASTRODUODENOSCOPY (EGD) WITH PROPOFOL;  Surgeon:  Manya Silvas, MD;  [facility] END[facility]COPY;  Service: Endoscopy;  Laterality: N/A;  . ESOPHAGOGASTRODUODENOSCOPY (EGD) WITH PROPOFOL N/A 04/14/2019   Procedure: ESOPHAGOGASTRODUODENOSCOPY (EGD) WITH PROPOFOL;  Surgeon: Toledo, Benay Pike, MD;  Location: [name] ENDOSCOPY;  Service: Gastroenterology;  Laterality: N/A;  . LYMPH NODE BIOPSY Right 07-24-11   Dr Bary Castilla  . MOHS SURGERY     bilateral shoulders  . PTCA    . skin cancer removal    . TOTAL ABDOMINAL HYSTERECTOMY W/ BILATERAL SALPINGOOPHORECTOMY      There were no vitals filed for this visit.  Subjective Assessment - 11/11/19 1202    Subjective  Pt is having pain in her back that is  6-8 /10    Pertinent History  Patient is a pleasant 85 year old female who presents to physical therapy for imbalance. Per previous documentation patient has been having unsteadiness for several months and burning in bilateral LE's and feet since 2015 after having a heart attack. PMH includes A fib, MI s/p stent placement, lymphoma, osteoporosis, ASCVD, atherosclerosis, atrophic vaginitis, CHF, HTN, fibrocystic disease of breast, GERD, glaucoma, HLD, colonic polyps, HLD, hypothyroidism, migraine, stress incontinence, polyneuropathy. It has been since October since she saw the physician for right sided weakness. Is having some pain in right hip, buttocks, leg, and foot. Foot is stabbing when first gets up and aches in the back of knee. Usually stumbles around corners.    Limitations  Lifting;Standing;Walking;House hold activities    How long can you sit comfortably?  all day    How long can you stand comfortably?  gets fatigued    How long can you walk comfortably?  2 blocks    Patient Stated Goals  to walk further    Currently in Pain?  Yes    Pain Score  6     Pain Location  Back    Pain Orientation  Lower    Pain Descriptors / Indicators  Aching    Pain Type  Acute pain    Pain Onset  In the past 7 days    Aggravating Factors   walking    Pain Relieving Factors  lying down    Effect of Pain on Daily Activities  difficult to do daily activities       Therapeutic exercise: Nu-step x 5 mins L 2 Supine: SLR x 15 BLE Hookling marching x 15 , with TA Hooklying abd/ER x 15 , with TA Bridging x 15 SAQ x 15 BLE Hip abd/add x 15, BLE Heel slides x 15, BLE hooklying and trunk rotation with TA Patient performed with instruction, verbal cues, tactile cues of therapist: goal: increase tissue extensibility, promote proper posture, improve mobility                         PT Education - 11/11/19 1205    Education Details  HEP    Person(s) Educated  Patient    Methods   Explanation    Comprehension  Verbalized understanding;Returned demonstration       PT Short Term Goals - 10/07/19 1120      PT SHORT TERM GOAL #1   Title  Patient will be independent in home exercise program to improve strength/mobility for better functional independence with ADLs.    Baseline  1/14: give next session 2/16: HEP compliant    Time  2    Period  Weeks    Status  Partially Met    Target Date  10/21/19        PT Long Term Goals - 10/30/19 1720      PT LONG TERM GOAL #1   Title  Patient will increase Berg Balance score by > 6 points (49/56)  to demonstrate decreased fall risk during functional activities.    Baseline  1/14: 43/56 2/16: 51/56,    Time  8    Period  Weeks    Status  Achieved      PT LONG TERM GOAL #2   Title  Patient will increase ABC scale score >80% to demonstrate better functional mobility and better confidence with ADLs    Baseline  1/14/ 49.4% 2/16: 57.5%, 10/30/19=62%    Time  8    Period  Weeks    Status  Partially Met    Target Date  12/25/19      PT LONG TERM GOAL #3   Title  Patient will increase dynamic gait index score to >19/24 as to demonstrate reduced fall risk and improved dynamic gait balance for better safety with community/home ambulation.    Baseline  1/14: 16/24 2/16: 19/24, 10/30/19= 20/24    Time  8    Period  Weeks    Status  Partially Met    Target Date  12/25/19      PT LONG TERM GOAL #4   Title  Patient will increase BLE gross strength to 4+/5 as to improve functional strength for independent gait, increased standing tolerance and increased ADL ability.    Baseline  1/14: R 3+/5, L 4-/5 2/16: R grossly 4-/5 L grossly 4-/5, 10/30/19=R 3+/5, L 4-/5 2/16: R grossly 4-/5 L grossly 4-/5,    Time  8    Period  Weeks    Status  Partially Met    Target Date  12/25/19            Plan - 11/11/19 1206    Clinical Impression Statement  Patient has back pain 6/10 today and lumbar tightness.. Patient challenged with  exercise technique requiring moderate VCs. Patient will continue to benefit from skilled physical therapy to improve strength and decrease back pain and improve mobility   Comorbidities  A fib, MI s/p stent placement, lymphoma, osteoporosis, ASCVD, atherosclerosis, atrophic vaginitis, CHF, HTN, fibrocystic disease of breast, GERD, glaucoma, HLD, colonic polyps, HLD, hypothyroidism, migraine, stress incontinence, polyneuropathy    Stability/Clinical Decision Making  Evolving/Moderate complexity    Rehab Potential  Good    PT Frequency  2x / week    PT Duration  8 weeks    PT Treatment/Interventions  ADLs/Self Care Home Management;Aquatic Therapy;Biofeedback;Canalith Repostioning;Cryotherapy;Electrical Stimulation;Moist Heat;Traction;Ultrasound;DME Instruction;Gait training;Stair training;Functional mobility training;Therapeutic activities;Therapeutic exercise;Balance training;Neuromuscular re-education;Patient/family education;Manual techniques;Passive range of motion;Dry needling;Energy conservation;Taping;Vestibular    PT Next Visit Plan  Continue with strengthening to improve floor to standing transitions; performance of stairs    PT Home Exercise Plan  Issued SLS and tandem stance    Consulted and Agree with Plan of Care  Patient       Patient will benefit from skilled therapeutic intervention in order to improve the following deficits and impairments:  Abnormal gait, Cardiopulmonary status limiting activity, Decreased activity tolerance, Decreased balance, Decreased endurance, Difficulty walking, Decreased strength, Postural dysfunction, Improper body mechanics  Visit Diagnosis: Unsteadiness on feet  Other abnormalities of gait and mobility  Muscle weakness (generalized)     Problem List Patient Active Problem List   Diagnosis Date Noted  . 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  H/O neoplasm 01/04/2015  . Arterial vascular disease 12/30/2013  . BP (high  blood pressure) 12/30/2013  . Adult hypothyroidism 12/30/2013  . OP (osteoporosis) 12/30/2013  . Avitaminosis D 12/30/2013  . Atrial fibrillation ([place]) 12/30/2013  . HLD (hyperlipidemia) 12/30/2013  . Preoperative cardiovascular examination 04/07/2013  . Hyperlipidemia 04/07/2013  . A-fib ([place])   . Coronary artery disease   . Chronic systolic heart failure [facility])     Alanson Puls, [place] DPT 11/11/2019, 12:07 PM  [place] MAIN [facility] SERVICES [address] [place], [place], [address] Phone: [phone]   Fax:  [phone]  Name: Jeanne Pittman MRN: [id number] Date of Birth: 04/09/1935

## 2019-11-12 ENCOUNTER — Other Ambulatory Visit: Payer: Self-pay

## 2019-11-12 MED ORDER — PANTOPRAZOLE SODIUM 40 MG PO TBEC: DELAYED_RELEASE_TABLET | ORAL | 0 refills | Status: DC

## 2019-11-13 ENCOUNTER — Ambulatory Visit: Payer: Medicare Other | Admitting: Physical Therapy

## 2019-11-13 ENCOUNTER — Other Ambulatory Visit: Payer: Self-pay

## 2019-11-13 ENCOUNTER — Encounter: Payer: Self-pay | Admitting: Physical Therapy

## 2019-11-13 DIAGNOSIS — M6281 Muscle weakness (generalized): Secondary | ICD-10-CM

## 2019-11-13 DIAGNOSIS — R2689 Other abnormalities of gait and mobility: Secondary | ICD-10-CM

## 2019-11-13 DIAGNOSIS — R2681 Unsteadiness on feet: Secondary | ICD-10-CM

## 2019-11-13 NOTE — Therapy (Signed)
[place] MAIN [facility] SERVICES [address] [place], [place], [address] Phone: [phone]   Fax:  [phone]  Physical Therapy Treatment  Patient Details  Name: Jeanne Pittman MRN: [id number] Date of Birth: 05/11/1935 Referring Provider (PT): Jennings Books   Encounter Date: 11/13/2019  PT End of Session - 11/13/19 1153    Visit Number  17    Number of Visits  37    Date for PT Re-Evaluation  12/25/19    Authorization Type  2/10 PN 10/07/19    PT Start Time  1145    PT Stop Time  1225    PT Time Calculation (min)  40 min    Equipment Utilized During Treatment  Gait belt    Activity Tolerance  Patient tolerated treatment well;No increased pain    Behavior During Therapy  WFL for tasks assessed/performed       Past Medical History:  Diagnosis Date  . A-fib ([place])    07/2012: A. fib with RVR in the setting of myocardial infarction. Converted to sinus rhythm with amiodarone. No recurrence.  . Anxiety   . Atrophic vaginitis   . CHF (congestive heart failure) ([place])   . Chronic combined systolic (congestive) and diastolic (congestive) heart failure ([place])    a. EF was 25-35% post MI but improved to 45-50% in 04/2013; b. 03/2017 Echo: EF 40-45%, mod focal/basal hypertrophy of septum. Sev mid-apicalanteroseptal, ant, and apical HK. Gr1 DD, mild AI/MR, mod TR, PASP 45mg.  . Colon adenomas   . Coronary artery disease    a. 176/5465STEMI complicated by cardiogenic shock. Cath/PCI: LAD 1053mDESx2), RCA 95p(DES). EF 25%; b. 03/2017 MV: EF 57%, fixed apical, periapical, mid to distal anteroseptal defect. No ischemia.  . Eczema   . Fibrocystic breast disease   . Gastritis   . GERD (gastroesophageal reflux disease)   . Glaucoma   . Headache   . History of gastritis   . Hyperlipidemia   . Hypertension   . Hypothyroidism   . Insomnia   . Ischemic cardiomyopathy    a. 07/2012 EF 25-35% following MI-->Improved to 45-50%;  b. 03/2017 Echo: EF 40-45%.  . Mar[place] KitchenLymphoma (HCEast [place]  . Myocardial infarction ([place]CMarl[place]12/29/2015  . Non Hodgkin's lymphoma (HCM[place]2005   reoccurance 2007 and 2012  . Osteoarthritis   . Osteoporosis   . Polyneuropathy   . Polyposis of colon   . Restless leg syndrome   . Urinary, incontinence, stress female     Past Surgical History:  Procedure Laterality Date  . ABDOMINAL HYSTERECTOMY  1956  . APPENDECTOMY    . AXILLARY LYMPH NODE BIOPSY Left 03/18/2015   Procedure: AXILLARY LYMPH NODE BIOPSY;  Surgeon: JeRobert BellowMD;  Location: [name];  Service: General;  Laterality: Left;  . CARDIAC CATHETERIZATION  08/19/2012   [name]; [place]  . COLONOSCOPY    . COLONOSCOPY WITH PROPOFOL N/A 03/02/2016   Procedure: COLONOSCOPY WITH PROPOFOL;  Surgeon: RoManya SilvasMD;  [facility];  Service: Endoscopy;  Laterality: N/A;  . COLONOSCOPY WITH PROPOFOL N/A 09/26/2017   Procedure: COLONOSCOPY WITH PROPOFOL;  Surgeon: ElManya SilvasMD;  [facility];  Service: Endoscopy;  Laterality: N/A;  . CORONARY ANGIOPLASTY WITH STENT PLACEMENT     x3 stents  . CORONARY ANGIOPLASTY WITH STENT PLACEMENT    . CORONARY ARTERY BYPASS GRAFT    . ESOPHAGOGASTRODUODENOSCOPY (EGD) WITH PROPOFOL N/A 03/02/2016   Procedure: ESOPHAGOGASTRODUODENOSCOPY (EGD) WITH PROPOFOL;  Surgeon:  Manya Silvas, MD;  [facility];  Service: Endoscopy;  Laterality: N/A;  . ESOPHAGOGASTRODUODENOSCOPY (EGD) WITH PROPOFOL N/A 04/14/2019   Procedure: ESOPHAGOGASTRODUODENOSCOPY (EGD) WITH PROPOFOL;  Surgeon: Toledo, Benay Pike, MD;  Location: [name] ENDOSCOPY;  Service: Gastroenterology;  Laterality: N/A;  . LYMPH NODE BIOPSY Right 07-24-11   Dr Bary Castilla  . MOHS SURGERY     bilateral shoulders  . PTCA    . skin cancer removal    . TOTAL ABDOMINAL HYSTERECTOMY W/ BILATERAL SALPINGOOPHORECTOMY      There were no vitals filed for this visit.  Subjective Assessment - 11/13/19 1151    Subjective  Pt is having pain in her back that is 6  /10    Pertinent History  Patient is a pleasant 85 year old female who presents to physical therapy for imbalance. Per previous documentation patient has been having unsteadiness for several months and burning in bilateral LE's and feet since 2015 after having a heart attack. PMH includes A fib, MI s/p stent placement, lymphoma, osteoporosis, ASCVD, atherosclerosis, atrophic vaginitis, CHF, HTN, fibrocystic disease of breast, GERD, glaucoma, HLD, colonic polyps, HLD, hypothyroidism, migraine, stress incontinence, polyneuropathy. It has been since October since she saw the physician for right sided weakness. Is having some pain in right hip, buttocks, leg, and foot. Foot is stabbing when first gets up and aches in the back of knee. Usually stumbles around corners.    Limitations  Lifting;Standing;Walking;House hold activities    How long can you sit comfortably?  all day    How long can you stand comfortably?  gets fatigued    How long can you walk comfortably?  2 blocks    Patient Stated Goals  to walk further    Currently in Pain?  Yes    Pain Score  6     Pain Location  Back    Pain Descriptors / Indicators  Aching    Pain Type  Acute pain    Pain Onset  In the past 7 days    Aggravating Factors   walking       Treatment;     Neuromuscular Re-education   Tandem stand on 1/2 foam without UE support x 2o reps Side stepping on foam without UE support x 2 lengths Heel/toe raises without UE support 3s hold x 10 each 1/2 foam roll balance with flat side up 30s x 2 reps 1/2 foam roll balance with flat side down 30s x 2 reps 1/2 foam roll tandem balance alternating forward LE 30s x 2 each LE forward Lateral side steps from foam to 6 inch stool left and right x 15 Backwards stepping from foam to 6 inch stool x 15  Leg press x 25 lbs x 20 x 3   Pt educated throughout session about proper posture and technique with exercises. Improved exercise technique, movement at target joints, use of  target muscles after min to mod verbal, visual, tactile cues.      Pt educated throughout session about proper posture and technique with exercises. Improved exercise technique, movement at target joints, use of target muscles after min to mod verbal, visual, tactile cues. CGA and Min to mod verbal cues used throughout with increased in postural sway and LOB most seen with narrow base of support and while on uneven surfaces.                   PT Education - 11/13/19 1151    Education Details  HEP  Person(s) Educated  Patient    Methods  Explanation    Comprehension  Verbalized understanding;Need further instruction;Returned demonstration       PT Short Term Goals - 10/07/19 1120      PT SHORT TERM GOAL #1   Title  Patient will be independent in home exercise program to improve strength/mobility for better functional independence with ADLs.    Baseline  1/14: give next session 2/16: HEP compliant    Time  2    Period  Weeks    Status  Partially Met    Target Date  10/21/19        PT Long Term Goals - 10/30/19 1720      PT LONG TERM GOAL #1   Title  Patient will increase Berg Balance score by > 6 points (49/56)  to demonstrate decreased fall risk during functional activities.    Baseline  1/14: 43/56 2/16: 51/56,    Time  8    Period  Weeks    Status  Achieved      PT LONG TERM GOAL #2   Title  Patient will increase ABC scale score >80% to demonstrate better functional mobility and better confidence with ADLs    Baseline  1/14/ 49.4% 2/16: 57.5%, 10/30/19=62%    Time  8    Period  Weeks    Status  Partially Met    Target Date  12/25/19      PT LONG TERM GOAL #3   Title  Patient will increase dynamic gait index score to >19/24 as to demonstrate reduced fall risk and improved dynamic gait balance for better safety with community/home ambulation.    Baseline  1/14: 16/24 2/16: 19/24, 10/30/19= 20/24    Time  8    Period  Weeks    Status  Partially Met     Target Date  12/25/19      PT LONG TERM GOAL #4   Title  Patient will increase BLE gross strength to 4+/5 as to improve functional strength for independent gait, increased standing tolerance and increased ADL ability.    Baseline  1/14: R 3+/5, L 4-/5 2/16: R grossly 4-/5 L grossly 4-/5, 10/30/19=R 3+/5, L 4-/5 2/16: R grossly 4-/5 L grossly 4-/5,    Time  8    Period  Weeks    Status  Partially Met    Target Date  12/25/19            Plan - 11/13/19 1154    Clinical Impression Statement  Pt presents with unsteadiness on uneven surfaces and fatigues with therapeutic exercises.  Patient tolerated all interventions well this date and will benefit from continued skilled PT interventions to improve strength and balance and decrease risk of falling.   Comorbidities  A fib, MI s/p stent placement, lymphoma, osteoporosis, ASCVD, atherosclerosis, atrophic vaginitis, CHF, HTN, fibrocystic disease of breast, GERD, glaucoma, HLD, colonic polyps, HLD, hypothyroidism, migraine, stress incontinence, polyneuropathy    Stability/Clinical Decision Making  Evolving/Moderate complexity    Rehab Potential  Good    PT Frequency  2x / week    PT Duration  8 weeks    PT Treatment/Interventions  ADLs/Self Care Home Management;Aquatic Therapy;Biofeedback;Canalith Repostioning;Cryotherapy;Electrical Stimulation;Moist Heat;Traction;Ultrasound;DME Instruction;Gait training;Stair training;Functional mobility training;Therapeutic activities;Therapeutic exercise;Balance training;Neuromuscular re-education;Patient/family education;Manual techniques;Passive range of motion;Dry needling;Energy conservation;Taping;Vestibular    PT Next Visit Plan  Continue with strengthening to improve floor to standing transitions; performance of stairs    PT Home Exercise Plan  Issued SLS and  tandem stance    Consulted and Agree with Plan of Care  Patient       Patient will benefit from skilled therapeutic intervention in order to  improve the following deficits and impairments:  Abnormal gait, Cardiopulmonary status limiting activity, Decreased activity tolerance, Decreased balance, Decreased endurance, Difficulty walking, Decreased strength, Postural dysfunction, Improper body mechanics  Visit Diagnosis: Unsteadiness on feet  Other abnormalities of gait and mobility  Muscle weakness (generalized)     Problem List Patient Active Problem List   Diagnosis Date Noted  . 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 H/O neoplasm 01/04/2015  . Arterial vascular disease 12/30/2013  . BP (high blood pressure) 12/30/2013  . Adult hypothyroidism 12/30/2013  . OP (osteoporosis) 12/30/2013  . Avitaminosis D 12/30/2013  . Atrial fibrillation ([place]) 12/30/2013  . HLD (hyperlipidemia) 12/30/2013  . Preoperative cardiovascular examination 04/07/2013  . Hyperlipidemia 04/07/2013  . A-fib ([place])   . Coronary artery disease   . Chronic systolic heart failure [facility])     Alanson Puls, [place] DPT 11/13/2019, 11:55 AM  [place] MAIN [facility] SERVICES [address] [place], [place], [address] Phone: [phone]   Fax:  [phone]  Name: Jeanne Pittman MRN: [id number] Date of Birth: 06/22/1935

## 2019-11-18 ENCOUNTER — Other Ambulatory Visit: Payer: Self-pay

## 2019-11-18 ENCOUNTER — Encounter: Payer: Self-pay | Admitting: Physical Therapy

## 2019-11-18 ENCOUNTER — Ambulatory Visit: Payer: Medicare Other | Admitting: Physical Therapy

## 2019-11-18 DIAGNOSIS — M6281 Muscle weakness (generalized): Secondary | ICD-10-CM

## 2019-11-18 DIAGNOSIS — R2681 Unsteadiness on feet: Secondary | ICD-10-CM | POA: Diagnosis not present

## 2019-11-18 DIAGNOSIS — R2689 Other abnormalities of gait and mobility: Secondary | ICD-10-CM

## 2019-11-18 NOTE — Therapy (Signed)
[place] MAIN [facility] SERVICES [address] [place], [place], [address] Phone: [phone]   Fax:  [phone]  Physical Therapy Treatment  Patient Details  Name: Jeanne Pittman MRN: [id number] Date of Birth: March 28, 1935 Referring Provider (PT): Jennings Books   Encounter Date: 11/18/2019  PT End of Session - 11/18/19 1148    Visit Number  18    Number of Visits  37    Date for PT Re-Evaluation  12/25/19    Authorization Type  2/10 PN 10/07/19    PT Start Time  1145    PT Stop Time  1225    PT Time Calculation (min)  40 min    Equipment Utilized During Treatment  Gait belt    Activity Tolerance  Patient tolerated treatment well;No increased pain    Behavior During Therapy  WFL for tasks assessed/performed       Past Medical History:  Diagnosis Date  . A-fib ([place])    07/2012: A. fib with RVR in the setting of myocardial infarction. Converted to sinus rhythm with amiodarone. No recurrence.  . Anxiety   . Atrophic vaginitis   . CHF (congestive heart failure) ([place])   . Chronic combined systolic (congestive) and diastolic (congestive) heart failure ([place])    a. EF was 25-35% post MI but improved to 45-50% in 04/2013; b. 03/2017 Echo: EF 40-45%, mod focal/basal hypertrophy of septum. Sev mid-apicalanteroseptal, ant, and apical HK. Gr1 DD, mild AI/MR, mod TR, PASP 62mg.  . Colon adenomas   . Coronary artery disease    a. 179/0240STEMI complicated by cardiogenic shock. Cath/PCI: LAD 1010mDESx2), RCA 95p(DES). EF 25%; b. 03/2017 MV: EF 57%, fixed apical, periapical, mid to distal anteroseptal defect. No ischemia.  . Eczema   . Fibrocystic breast disease   . Gastritis   . GERD (gastroesophageal reflux disease)   . Glaucoma   . Headache   . History of gastritis   . Hyperlipidemia   . Hypertension   . Hypothyroidism   . Insomnia   . Ischemic cardiomyopathy    a. 07/2012 EF 25-35% following MI-->Improved to 45-50%;  b. 03/2017 Echo: EF 40-45%.  . Mar[place]ymphoma ([place]  . Myocardial infarction ([place]CO[place]12/29/2015  . Non Hodgkin's lymphoma (HCK[place]2005   reoccurance 2007 and 2012  . Osteoarthritis   . Osteoporosis   . Polyneuropathy   . Polyposis of colon   . Restless leg syndrome   . Urinary, incontinence, stress female     Past Surgical History:  Procedure Laterality Date  . ABDOMINAL HYSTERECTOMY  1956  . APPENDECTOMY    . AXILLARY LYMPH NODE BIOPSY Left 03/18/2015   Procedure: AXILLARY LYMPH NODE BIOPSY;  Surgeon: JeRobert BellowMD;  Location: [name];  Service: General;  Laterality: Left;  . CARDIAC CATHETERIZATION  08/19/2012   [name]; [facility]  . COLONOSCOPY    . COLONOSCOPY WITH PROPOFOL N/A 03/02/2016   Procedure: COLONOSCOPY WITH PROPOFOL;  Surgeon: RoManya SilvasMD;  [facility];  Service: Endoscopy;  Laterality: N/A;  . COLONOSCOPY WITH PROPOFOL N/A 09/26/2017   Procedure: COLONOSCOPY WITH PROPOFOL;  Surgeon: ElManya SilvasMD;  [facility];  Service: Endoscopy;  Laterality: N/A;  . CORONARY ANGIOPLASTY WITH STENT PLACEMENT     x3 stents  . CORONARY ANGIOPLASTY WITH STENT PLACEMENT    . CORONARY ARTERY BYPASS GRAFT    . ESOPHAGOGASTRODUODENOSCOPY (EGD) WITH PROPOFOL N/A 03/02/2016   Procedure: ESOPHAGOGASTRODUODENOSCOPY (EGD) WITH PROPOFOL;  Surgeon:  Manya Silvas, MD;  [facility] END[facility]COPY;  Service: Endoscopy;  Laterality: N/A;  . ESOPHAGOGASTRODUODENOSCOPY (EGD) WITH PROPOFOL N/A 04/14/2019   Procedure: ESOPHAGOGASTRODUODENOSCOPY (EGD) WITH PROPOFOL;  Surgeon: Toledo, Benay Pike, MD;  Location: [name] ENDOSCOPY;  Service: Gastroenterology;  Laterality: N/A;  . LYMPH NODE BIOPSY Right 07-24-11   Dr Bary Castilla  . MOHS SURGERY     bilateral shoulders  . PTCA    . skin cancer removal    . TOTAL ABDOMINAL HYSTERECTOMY W/ BILATERAL SALPINGOOPHORECTOMY      There were no vitals filed for this visit.  Subjective Assessment - 11/18/19 1147    Subjective  Pt is having pain in her back that is 6  /10    Pertinent History  Patient is a pleasant 85 year old female who presents to physical therapy for imbalance. Per previous documentation patient has been having unsteadiness for several months and burning in bilateral LE's and feet since 2015 after having a heart attack. PMH includes A fib, MI s/p stent placement, lymphoma, osteoporosis, ASCVD, atherosclerosis, atrophic vaginitis, CHF, HTN, fibrocystic disease of breast, GERD, glaucoma, HLD, colonic polyps, HLD, hypothyroidism, migraine, stress incontinence, polyneuropathy. It has been since October since she saw the physician for right sided weakness. Is having some pain in right hip, buttocks, leg, and foot. Foot is stabbing when first gets up and aches in the back of knee. Usually stumbles around corners.    Limitations  Lifting;Standing;Walking;House hold activities    How long can you sit comfortably?  all day    How long can you stand comfortably?  gets fatigued    How long can you walk comfortably?  2 blocks    Patient Stated Goals  to walk further    Currently in Pain?  Yes    Pain Score  6     Pain Location  Back    Pain Descriptors / Indicators  Aching    Pain Type  Chronic pain    Pain Onset  In the past 7 days    Pain Frequency  Intermittent    Aggravating Factors   walkiing    Pain Relieving Factors  lying down    Effect of Pain on Daily Activities  difficult to do daily acitvities    Multiple Pain Sites  No       Neuromuscular Re-education   Tandem gait on floor without UE support x 2 lengths Side stepping on floor without UE support x 2 lengths Heel/toe raises withUE support 2 s hold x 10 each 1/2 foam roll balance with flat side up 30s x 2 reps 1/2 foam roll balance with flat side down 30s x 2 reps 1/2 foam roll tandem balance alternating forward LE 30s x 2 each LE forward Lateral side steps from foam to 6 inch stool left and right x 15 Backwards stepping from foam to 6 inch stool x 15  Matrix 22. 5 lbs x 5 reps  fwd/bwd, side to side  Leg press x 25 lbs x 20 x 2 , heel raises x 20 x 2        Pt educated throughout session about proper posture and technique with exercises. Improved exercise technique, movement at target joints, use of target muscles after min to mod verbal, visual, tactile cues. CGA and Min to mod verbal cues used throughout with increased in postural sway and LOB most seen with narrow base of support and while on uneven surfaces.  PT Education - 11/18/19 1148    Education Details  HEP    Person(s) Educated  Patient    Methods  Explanation    Comprehension  Verbalized understanding;Returned demonstration;Verbal cues required;Need further instruction       PT Short Term Goals - 10/07/19 1120      PT SHORT TERM GOAL #1   Title  Patient will be independent in home exercise program to improve strength/mobility for better functional independence with ADLs.    Baseline  1/14: give next session 2/16: HEP compliant    Time  2    Period  Weeks    Status  Partially Met    Target Date  10/21/19        PT Long Term Goals - 10/30/19 1720      PT LONG TERM GOAL #1   Title  Patient will increase Berg Balance score by > 6 points (49/56)  to demonstrate decreased fall risk during functional activities.    Baseline  1/14: 43/56 2/16: 51/56,    Time  8    Period  Weeks    Status  Achieved      PT LONG TERM GOAL #2   Title  Patient will increase ABC scale score >80% to demonstrate better functional mobility and better confidence with ADLs    Baseline  1/14/ 49.4% 2/16: 57.5%, 10/30/19=62%    Time  8    Period  Weeks    Status  Partially Met    Target Date  12/25/19      PT LONG TERM GOAL #3   Title  Patient will increase dynamic gait index score to >19/24 as to demonstrate reduced fall risk and improved dynamic gait balance for better safety with community/home ambulation.    Baseline  1/14: 16/24 2/16: 19/24, 10/30/19= 20/24    Time  8     Period  Weeks    Status  Partially Met    Target Date  12/25/19      PT LONG TERM GOAL #4   Title  Patient will increase BLE gross strength to 4+/5 as to improve functional strength for independent gait, increased standing tolerance and increased ADL ability.    Baseline  1/14: R 3+/5, L 4-/5 2/16: R grossly 4-/5 L grossly 4-/5, 10/30/19=R 3+/5, L 4-/5 2/16: R grossly 4-/5 L grossly 4-/5,    Time  8    Period  Weeks    Status  Partially Met    Target Date  12/25/19            Plan - 11/18/19 1149    Clinical Impression Statement  Patient instructed in intermediate strengthening and balance exercise.  Patient requires min Vcs for correct exercise technique including to improve LE control with standing exercise. Patient demonstrates better quad control with SLS tasks with rail assist. Patient would benefit from additional skilled PT intervention to improve balance/gait safety and reduce fall risk.    Comorbidities  A fib, MI s/p stent placement, lymphoma, osteoporosis, ASCVD, atherosclerosis, atrophic vaginitis, CHF, HTN, fibrocystic disease of breast, GERD, glaucoma, HLD, colonic polyps, HLD, hypothyroidism, migraine, stress incontinence, polyneuropathy    Stability/Clinical Decision Making  Evolving/Moderate complexity    Rehab Potential  Good    PT Frequency  2x / week    PT Duration  8 weeks    PT Treatment/Interventions  ADLs/Self Care Home Management;Aquatic Therapy;Biofeedback;Canalith Repostioning;Cryotherapy;Electrical Stimulation;Moist Heat;Traction;Ultrasound;DME Instruction;Gait training;Stair training;Functional mobility training;Therapeutic activities;Therapeutic exercise;Balance training;Neuromuscular re-education;Patient/family education;Manual techniques;Passive range of motion;Dry needling;Energy  conservation;Taping;Vestibular    PT Next Visit Plan  Continue with strengthening to improve floor to standing transitions; performance of stairs    PT Home Exercise Plan   Issued SLS and tandem stance    Consulted and Agree with Plan of Care  Patient       Patient will benefit from skilled therapeutic intervention in order to improve the following deficits and impairments:  Abnormal gait, Cardiopulmonary status limiting activity, Decreased activity tolerance, Decreased balance, Decreased endurance, Difficulty walking, Decreased strength, Postural dysfunction, Improper body mechanics  Visit Diagnosis: Unsteadiness on feet  Other abnormalities of gait and mobility  Muscle weakness (generalized)     Problem List Patient Active Problem List   Diagnosis Date Noted  . 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 H/O neoplasm 01/04/2015  . Arterial vascular disease 12/30/2013  . BP (high blood pressure) 12/30/2013  . Adult hypothyroidism 12/30/2013  . OP (osteoporosis) 12/30/2013  . Avitaminosis D 12/30/2013  . Atrial fibrillation ([place]) 12/30/2013  . HLD (hyperlipidemia) 12/30/2013  . Preoperative cardiovascular examination 04/07/2013  . Hyperlipidemia 04/07/2013  . A-fib ([place])   . Coronary artery disease   . Chronic systolic heart failure [facility])     Alanson Puls, [place] DPT 11/18/2019, 11:50 AM  [place] MAIN [facility] SERVICES [address] [place], [place], [address] Phone: [phone]   Fax:  [phone]  Name: Jeanne Pittman MRN: [id number] Date of Birth: [birth year]

## 2019-11-20 ENCOUNTER — Other Ambulatory Visit: Payer: Self-pay

## 2019-11-20 ENCOUNTER — Encounter: Payer: Self-pay | Admitting: Physical Therapy

## 2019-11-20 ENCOUNTER — Ambulatory Visit: Payer: Medicare Other | Attending: Neurology | Admitting: Physical Therapy

## 2019-11-20 DIAGNOSIS — R2681 Unsteadiness on feet: Secondary | ICD-10-CM | POA: Insufficient documentation

## 2019-11-20 DIAGNOSIS — R2689 Other abnormalities of gait and mobility: Secondary | ICD-10-CM | POA: Insufficient documentation

## 2019-11-20 DIAGNOSIS — M6281 Muscle weakness (generalized): Secondary | ICD-10-CM | POA: Insufficient documentation

## 2019-11-20 NOTE — Therapy (Signed)
[place] MAIN [facility] SERVICES [address] [place], [place], [address] Phone: [phone]   Fax:  [phone]  Physical Therapy Treatment  Patient Details  Name: Jeanne Pittman MRN: [id number] Date of Birth: 10/15/1934 Referring Provider (PT): Jennings Books   Encounter Date: 11/20/2019  PT End of Session - 11/20/19 1155    Visit Number  19    Number of Visits  37    Date for PT Re-Evaluation  12/25/19    Authorization Type  2/10 PN 10/07/19    PT Start Time  1145    PT Stop Time  1225    PT Time Calculation (min)  40 min    Equipment Utilized During Treatment  Gait belt    Activity Tolerance  Patient tolerated treatment well;No increased pain    Behavior During Therapy  WFL for tasks assessed/performed       Past Medical History:  Diagnosis Date  . A-fib ([place])    07/2012: A. fib with RVR in the setting of myocardial infarction. Converted to sinus rhythm with amiodarone. No recurrence.  . Anxiety   . Atrophic vaginitis   . CHF (congestive heart failure) ([place])   . Chronic combined systolic (congestive) and diastolic (congestive) heart failure ([place])    a. EF was 25-35% post MI but improved to 45-50% in 04/2013; b. 03/2017 Echo: EF 40-45%, mod focal/basal hypertrophy of septum. Sev mid-apicalanteroseptal, ant, and apical HK. Gr1 DD, mild AI/MR, mod TR, PASP 9mg.  . Colon adenomas   . Coronary artery disease    a. 113/2440STEMI complicated by cardiogenic shock. Cath/PCI: LAD 1029mDESx2), RCA 95p(DES). EF 25%; b. 03/2017 MV: EF 57%, fixed apical, periapical, mid to distal anteroseptal defect. No ischemia.  . Eczema   . Fibrocystic breast disease   . Gastritis   . GERD (gastroesophageal reflux disease)   . Glaucoma   . Headache   . History of gastritis   . Hyperlipidemia   . Hypertension   . Hypothyroidism   . Insomnia   . Ischemic cardiomyopathy    a. 07/2012 EF 25-35% following MI-->Improved to 45-50%;  b. 03/2017 Echo: EF 40-45%.  . Mar[place] KitchenLymphoma (HCExcelsior [place]  . Myocardial infarction (HCM[place]oth[place]12/29/2015  . Non Hodgkin's lymphoma (HCTierra [place]2005   reoccurance 2007 and 2012  . Osteoarthritis   . Osteoporosis   . Polyneuropathy   . Polyposis of colon   . Restless leg syndrome   . Urinary, incontinence, stress female     Past Surgical History:  Procedure Laterality Date  . ABDOMINAL HYSTERECTOMY  1956  . APPENDECTOMY    . AXILLARY LYMPH NODE BIOPSY Left 03/18/2015   Procedure: AXILLARY LYMPH NODE BIOPSY;  Surgeon: JeRobert BellowMD;  Location: [name];  Service: General;  Laterality: Left;  . CARDIAC CATHETERIZATION  08/19/2012   [name]; [facility]  . COLONOSCOPY    . COLONOSCOPY WITH PROPOFOL N/A 03/02/2016   Procedure: COLONOSCOPY WITH PROPOFOL;  Surgeon: RoManya SilvasMD;  [facility];  Service: Endoscopy;  Laterality: N/A;  . COLONOSCOPY WITH PROPOFOL N/A 09/26/2017   Procedure: COLONOSCOPY WITH PROPOFOL;  Surgeon: ElManya SilvasMD;  [facility];  Service: Endoscopy;  Laterality: N/A;  . CORONARY ANGIOPLASTY WITH STENT PLACEMENT     x3 stents  . CORONARY ANGIOPLASTY WITH STENT PLACEMENT    . CORONARY ARTERY BYPASS GRAFT    . ESOPHAGOGASTRODUODENOSCOPY (EGD) WITH PROPOFOL N/A 03/02/2016   Procedure: ESOPHAGOGASTRODUODENOSCOPY (EGD) WITH PROPOFOL;  Surgeon:  Manya Silvas, MD;  [facility];  Service: Endoscopy;  Laterality: N/A;  . ESOPHAGOGASTRODUODENOSCOPY (EGD) WITH PROPOFOL N/A 04/14/2019   Procedure: ESOPHAGOGASTRODUODENOSCOPY (EGD) WITH PROPOFOL;  Surgeon: Toledo, Benay Pike, MD;  Location: [name] ENDOSCOPY;  Service: Gastroenterology;  Laterality: N/A;  . LYMPH NODE BIOPSY Right 07-24-11   Dr Bary Castilla  . MOHS SURGERY     bilateral shoulders  . PTCA    . skin cancer removal    . TOTAL ABDOMINAL HYSTERECTOMY W/ BILATERAL SALPINGOOPHORECTOMY      There were no vitals filed for this visit.  Subjective Assessment - 11/20/19 1152    Subjective  Pt is having pain in her back that is 5  /10    Pertinent History  Patient is a pleasant 85 year old female who presents to physical therapy for imbalance. Per previous documentation patient has been having unsteadiness for several months and burning in bilateral LE's and feet since 2015 after having a heart attack. PMH includes A fib, MI s/p stent placement, lymphoma, osteoporosis, ASCVD, atherosclerosis, atrophic vaginitis, CHF, HTN, fibrocystic disease of breast, GERD, glaucoma, HLD, colonic polyps, HLD, hypothyroidism, migraine, stress incontinence, polyneuropathy. It has been since October since she saw the physician for right sided weakness. Is having some pain in right hip, buttocks, leg, and foot. Foot is stabbing when first gets up and aches in the back of knee. Usually stumbles around corners.    Limitations  Lifting;Standing;Walking;House hold activities    How long can you sit comfortably?  all day    How long can you stand comfortably?  gets fatigued    How long can you walk comfortably?  2 blocks    Patient Stated Goals  to walk further    Currently in Pain?  Yes    Pain Score  5     Pain Location  Back    Pain Onset  In the past 7 days         Therapeutic exercise: Nu-step  x 5 mins L 4  Supine: SLR x 15 BLE Hookling marching x 15  Hooklying abd/ER x 15  Bridging x 15 SAQ x 15 BLE Hip abd/add x 15, BLE Heel slides x 15, BLE  Sidelying: Hip abd x 15 , BLE  Pt educated throughout session about proper posture and technique with exercises. Improved exercise technique, movement at target joints, use of target muscles after min to mod verbal, visual, tactile cues.                        PT Education - 11/20/19 1152    Education Details  HEP    Person(s) Educated  Patient    Methods  Explanation    Comprehension  Verbalized understanding       PT Short Term Goals - 10/07/19 1120      PT SHORT TERM GOAL #1   Title  Patient will be independent in home exercise program to improve  strength/mobility for better functional independence with ADLs.    Baseline  1/14: give next session 2/16: HEP compliant    Time  2    Period  Weeks    Status  Partially Met    Target Date  10/21/19        PT Long Term Goals - 10/30/19 1720      PT LONG TERM GOAL #1   Title  Patient will increase Berg Balance score by > 6 points (49/56)  to demonstrate decreased  fall risk during functional activities.    Baseline  1/14: 43/56 2/16: 51/56,    Time  8    Period  Weeks    Status  Achieved      PT LONG TERM GOAL #2   Title  Patient will increase ABC scale score >80% to demonstrate better functional mobility and better confidence with ADLs    Baseline  1/14/ 49.4% 2/16: 57.5%, 10/30/19=62%    Time  8    Period  Weeks    Status  Partially Met    Target Date  12/25/19      PT LONG TERM GOAL #3   Title  Patient will increase dynamic gait index score to >19/24 as to demonstrate reduced fall risk and improved dynamic gait balance for better safety with community/home ambulation.    Baseline  1/14: 16/24 2/16: 19/24, 10/30/19= 20/24    Time  8    Period  Weeks    Status  Partially Met    Target Date  12/25/19      PT LONG TERM GOAL #4   Title  Patient will increase BLE gross strength to 4+/5 as to improve functional strength for independent gait, increased standing tolerance and increased ADL ability.    Baseline  1/14: R 3+/5, L 4-/5 2/16: R grossly 4-/5 L grossly 4-/5, 10/30/19=R 3+/5, L 4-/5 2/16: R grossly 4-/5 L grossly 4-/5,    Time  8    Period  Weeks    Status  Partially Met    Target Date  12/25/19            Plan - 11/20/19 1156    Clinical Impression Statement  p    Personal Factors and Comorbidities  Age;Comorbidity 3+;Past/Current Experience;Social Background;Time since onset of injury/illness/exacerbation    Comorbidities  A fib, MI s/p stent placement, lymphoma, osteoporosis, ASCVD, atherosclerosis, atrophic vaginitis, CHF, HTN, fibrocystic disease of breast,  GERD, glaucoma, HLD, colonic polyps, HLD, hypothyroidism, migraine, stress incontinence, polyneuropathy    Examination-Activity Limitations  Bend;Caring for Others;Carry;Continence;Dressing;Stairs;Squat;Sit;Reach Overhead;Locomotion Level;Lift;Stand;Toileting    Examination-Participation Restrictions  Church;Cleaning;Community Activity;Interpersonal Relationship;Laundry;Volunteer;Shop;Meal Prep;Yard Work    [employer]  Evolving/Moderate complexity    Rehab Potential  Good    PT Frequency  2x / week    PT Duration  8 weeks    PT Treatment/Interventions  ADLs/Self Care Home Management;Aquatic Therapy;Biofeedback;Canalith Repostioning;Cryotherapy;Electrical Stimulation;Moist Heat;Traction;Ultrasound;DME Instruction;Gait training;Stair training;Functional mobility training;Therapeutic activities;Therapeutic exercise;Balance training;Neuromuscular re-education;Patient/family education;Manual techniques;Passive range of motion;Dry needling;Energy conservation;Taping;Vestibular    PT Next Visit Plan  Continue with strengthening to improve floor to standing transitions; performance of stairs    PT Home Exercise Plan  Issued SLS and tandem stance    Consulted and Agree with Plan of Care  Patient       Patient will benefit from skilled therapeutic intervention in order to improve the following deficits and impairments:  Abnormal gait, Cardiopulmonary status limiting activity, Decreased activity tolerance, Decreased balance, Decreased endurance, Difficulty walking, Decreased strength, Postural dysfunction, Improper body mechanics  Visit Diagnosis: Unsteadiness on feet  Other abnormalities of gait and mobility  Muscle weakness (generalized)     Problem List Patient Active Problem List   Diagnosis Date Noted  . 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 H/O neoplasm 01/04/2015  . Arterial vascular disease 12/30/2013  . BP (high blood pressure)  12/30/2013  . Adult hypothyroidism 12/30/2013  . OP (osteoporosis) 12/30/2013  . Avitaminosis D 12/30/2013  . Atrial fibrillation ([place]) 12/30/2013  .  HLD (hyperlipidemia) 12/30/2013  . Preoperative cardiovascular examination 04/07/2013  . Hyperlipidemia 04/07/2013  . A-fib ([place])   . Coronary artery disease   . Chronic systolic heart failure [facility])     Alanson Puls 11/20/2019, 1:40 PM  [place] MAIN [facility] SERVICES [address] [place], [place], [address] Phone: [phone]   Fax:  [phone]  Name: Jeanne Pittman MRN: [id number] Date of Birth: July 16, 1935

## 2019-11-25 ENCOUNTER — Ambulatory Visit: Payer: Medicare Other | Admitting: Physical Therapy

## 2019-11-27 ENCOUNTER — Ambulatory Visit: Payer: Medicare Other | Admitting: Physical Therapy

## 2020-01-19 ENCOUNTER — Emergency Department: Payer: Medicare Other

## 2020-01-19 ENCOUNTER — Inpatient Hospital Stay
Admission: EM | Admit: 2020-01-19 | Discharge: 2020-01-24 | DRG: 392 | Disposition: A | Discharge status: Home Health | Payer: Medicare Other | Attending: Internal Medicine | Admitting: Internal Medicine

## 2020-01-19 ENCOUNTER — Inpatient Hospital Stay: Payer: Medicare Other

## 2020-01-19 ENCOUNTER — Other Ambulatory Visit: Payer: Self-pay

## 2020-01-19 DIAGNOSIS — I351 Nonrheumatic aortic (valve) insufficiency: Secondary | ICD-10-CM | POA: Diagnosis not present

## 2020-01-19 DIAGNOSIS — H409 Unspecified glaucoma: Secondary | ICD-10-CM | POA: Diagnosis present

## 2020-01-19 DIAGNOSIS — R109 Unspecified abdominal pain: Secondary | ICD-10-CM

## 2020-01-19 DIAGNOSIS — Z79899 Other long term (current) drug therapy: Secondary | ICD-10-CM

## 2020-01-19 DIAGNOSIS — E861 Hypovolemia: Secondary | ICD-10-CM | POA: Diagnosis present

## 2020-01-19 DIAGNOSIS — K5792 Diverticulitis of intestine, part unspecified, without perforation or abscess without bleeding: Secondary | ICD-10-CM | POA: Diagnosis present

## 2020-01-19 DIAGNOSIS — R6510 Systemic inflammatory response syndrome (SIRS) of non-infectious origin without acute organ dysfunction: Secondary | ICD-10-CM

## 2020-01-19 DIAGNOSIS — I2510 Atherosclerotic heart disease of native coronary artery without angina pectoris: Secondary | ICD-10-CM | POA: Diagnosis present

## 2020-01-19 DIAGNOSIS — E876 Hypokalemia: Secondary | ICD-10-CM | POA: Diagnosis not present

## 2020-01-19 DIAGNOSIS — Z955 Presence of coronary angioplasty implant and graft: Secondary | ICD-10-CM

## 2020-01-19 DIAGNOSIS — I480 Paroxysmal atrial fibrillation: Secondary | ICD-10-CM | POA: Diagnosis present

## 2020-01-19 DIAGNOSIS — I110 Hypertensive heart disease with heart failure: Secondary | ICD-10-CM | POA: Diagnosis present

## 2020-01-19 DIAGNOSIS — Z8249 Family history of ischemic heart disease and other diseases of the circulatory system: Secondary | ICD-10-CM

## 2020-01-19 DIAGNOSIS — G629 Polyneuropathy, unspecified: Secondary | ICD-10-CM | POA: Diagnosis present

## 2020-01-19 DIAGNOSIS — Z20822 Contact with and (suspected) exposure to covid-19: Secondary | ICD-10-CM | POA: Diagnosis present

## 2020-01-19 DIAGNOSIS — I340 Nonrheumatic mitral (valve) insufficiency: Secondary | ICD-10-CM | POA: Diagnosis not present

## 2020-01-19 DIAGNOSIS — G2581 Restless legs syndrome: Secondary | ICD-10-CM | POA: Diagnosis present

## 2020-01-19 DIAGNOSIS — E860 Dehydration: Secondary | ICD-10-CM | POA: Diagnosis present

## 2020-01-19 DIAGNOSIS — K5732 Diverticulitis of large intestine without perforation or abscess without bleeding: Secondary | ICD-10-CM | POA: Diagnosis not present

## 2020-01-19 DIAGNOSIS — I4891 Unspecified atrial fibrillation: Secondary | ICD-10-CM | POA: Diagnosis not present

## 2020-01-19 DIAGNOSIS — E039 Hypothyroidism, unspecified: Secondary | ICD-10-CM | POA: Diagnosis present

## 2020-01-19 DIAGNOSIS — E785 Hyperlipidemia, unspecified: Secondary | ICD-10-CM | POA: Diagnosis present

## 2020-01-19 DIAGNOSIS — I493 Ventricular premature depolarization: Secondary | ICD-10-CM | POA: Diagnosis present

## 2020-01-19 DIAGNOSIS — C8590 Non-Hodgkin lymphoma, unspecified, unspecified site: Secondary | ICD-10-CM | POA: Diagnosis present

## 2020-01-19 DIAGNOSIS — K5720 Diverticulitis of large intestine with perforation and abscess without bleeding: Secondary | ICD-10-CM | POA: Diagnosis present

## 2020-01-19 DIAGNOSIS — I5042 Chronic combined systolic (congestive) and diastolic (congestive) heart failure: Secondary | ICD-10-CM | POA: Diagnosis present

## 2020-01-19 DIAGNOSIS — E162 Hypoglycemia, unspecified: Secondary | ICD-10-CM | POA: Diagnosis not present

## 2020-01-19 DIAGNOSIS — D696 Thrombocytopenia, unspecified: Secondary | ICD-10-CM | POA: Diagnosis present

## 2020-01-19 DIAGNOSIS — Z951 Presence of aortocoronary bypass graft: Secondary | ICD-10-CM

## 2020-01-19 DIAGNOSIS — K3533 Acute appendicitis with perforation and localized peritonitis, with abscess: Secondary | ICD-10-CM

## 2020-01-19 DIAGNOSIS — I361 Nonrheumatic tricuspid (valve) insufficiency: Secondary | ICD-10-CM | POA: Diagnosis not present

## 2020-01-19 DIAGNOSIS — Z87891 Personal history of nicotine dependence: Secondary | ICD-10-CM

## 2020-01-19 DIAGNOSIS — R55 Syncope and collapse: Secondary | ICD-10-CM

## 2020-01-19 DIAGNOSIS — I255 Ischemic cardiomyopathy: Secondary | ICD-10-CM | POA: Diagnosis present

## 2020-01-19 DIAGNOSIS — Z85828 Personal history of other malignant neoplasm of skin: Secondary | ICD-10-CM

## 2020-01-19 DIAGNOSIS — I272 Pulmonary hypertension, unspecified: Secondary | ICD-10-CM | POA: Diagnosis present

## 2020-01-19 DIAGNOSIS — E872 Acidosis: Secondary | ICD-10-CM | POA: Diagnosis not present

## 2020-01-19 DIAGNOSIS — M1990 Unspecified osteoarthritis, unspecified site: Secondary | ICD-10-CM | POA: Diagnosis present

## 2020-01-19 DIAGNOSIS — I252 Old myocardial infarction: Secondary | ICD-10-CM

## 2020-01-19 DIAGNOSIS — I5022 Chronic systolic (congestive) heart failure: Secondary | ICD-10-CM | POA: Diagnosis present

## 2020-01-19 DIAGNOSIS — K219 Gastro-esophageal reflux disease without esophagitis: Secondary | ICD-10-CM | POA: Diagnosis present

## 2020-01-19 DIAGNOSIS — Z885 Allergy status to narcotic agent status: Secondary | ICD-10-CM

## 2020-01-19 DIAGNOSIS — K5900 Constipation, unspecified: Secondary | ICD-10-CM | POA: Diagnosis present

## 2020-01-19 DIAGNOSIS — Z888 Allergy status to other drugs, medicaments and biological substances status: Secondary | ICD-10-CM

## 2020-01-19 DIAGNOSIS — N6019 Diffuse cystic mastopathy of unspecified breast: Secondary | ICD-10-CM | POA: Diagnosis present

## 2020-01-19 DIAGNOSIS — Z7982 Long term (current) use of aspirin: Secondary | ICD-10-CM

## 2020-01-19 DIAGNOSIS — M810 Age-related osteoporosis without current pathological fracture: Secondary | ICD-10-CM | POA: Diagnosis present

## 2020-01-19 LAB — URINALYSIS, COMPLETE (UACMP) WITH MICROSCOPIC
Bacteria, UA: NONE SEEN
Bilirubin Urine: NEGATIVE
Glucose, UA: NEGATIVE mg/dL
Hgb urine dipstick: NEGATIVE
Ketones, ur: NEGATIVE mg/dL
Nitrite: NEGATIVE
Protein, ur: 30 mg/dL — AB
Specific Gravity, Urine: 1.013 (ref 1.005–1.030)
pH: 7 (ref 5.0–8.0)

## 2020-01-19 LAB — TROPONIN I (HIGH SENSITIVITY)
Troponin I (High Sensitivity): 8 ng/L (ref <18)
Troponin I (High Sensitivity): 12 ng/L (ref <18)

## 2020-01-19 LAB — CBC WITH DIFFERENTIAL/PLATELET
Abs Immature Granulocytes: 0.07 10*3/uL (ref 0.00–0.07)
Basophils Absolute: 0 10*3/uL (ref 0.0–0.1)
Basophils Relative: 0 %
Eosinophils Absolute: 0 10*3/uL (ref 0.0–0.5)
Eosinophils Relative: 0 %
HCT: 35.5 % — ABNORMAL LOW (ref 36.0–46.0)
Hemoglobin: 12.3 g/dL (ref 12.0–15.0)
Immature Granulocytes: 1 %
Lymphocytes Relative: 33 %
Lymphs Abs: 5 10*3/uL — ABNORMAL HIGH (ref 0.7–4.0)
MCH: 33.2 pg (ref 26.0–34.0)
MCHC: 34.6 g/dL (ref 30.0–36.0)
MCV: 95.7 fL (ref 80.0–100.0)
Monocytes Absolute: 0.7 10*3/uL (ref 0.1–1.0)
Monocytes Relative: 4 %
Neutro Abs: 9.4 10*3/uL — ABNORMAL HIGH (ref 1.7–7.7)
Neutrophils Relative %: 62 %
Platelets: 88 10*3/uL — ABNORMAL LOW (ref 150–400)
RBC: 3.71 MIL/uL — ABNORMAL LOW (ref 3.87–5.11)
RDW: 14.8 % (ref 11.5–15.5)
WBC: 15.2 10*3/uL — ABNORMAL HIGH (ref 4.0–10.5)
nRBC: 0 % (ref 0.0–0.2)

## 2020-01-19 LAB — MAGNESIUM: Magnesium: 1.9 mg/dL (ref 1.7–2.4)

## 2020-01-19 LAB — LIPASE, BLOOD: Lipase: 30 U/L (ref 11–51)

## 2020-01-19 LAB — COMPREHENSIVE METABOLIC PANEL
ALT: 10 U/L (ref 0–44)
AST: 13 U/L — ABNORMAL LOW (ref 15–41)
Albumin: 3.2 g/dL — ABNORMAL LOW (ref 3.5–5.0)
Alkaline Phosphatase: 42 U/L (ref 38–126)
Anion gap: 9 (ref 5–15)
BUN: 20 mg/dL (ref 8–23)
CO2: 21 mmol/L — ABNORMAL LOW (ref 22–32)
Calcium: 8.2 mg/dL — ABNORMAL LOW (ref 8.9–10.3)
Chloride: 104 mmol/L (ref 98–111)
Creatinine, Ser: 0.69 mg/dL (ref 0.44–1.00)
GFR calc Af Amer: >60 mL/min (ref >60)
GFR calc non Af Amer: >60 mL/min (ref >60)
Glucose, Bld: 105 mg/dL — ABNORMAL HIGH (ref 70–99)
Potassium: 3.9 mmol/L (ref 3.5–5.1)
Sodium: 134 mmol/L — ABNORMAL LOW (ref 135–145)
Total Bilirubin: 1.7 mg/dL — ABNORMAL HIGH (ref 0.3–1.2)
Total Protein: 6.3 g/dL — ABNORMAL LOW (ref 6.5–8.1)

## 2020-01-19 LAB — SARS CORONAVIRUS 2 BY RT PCR (HOSPITAL ORDER, PERFORMED IN ~~LOC~~ HOSPITAL LAB): SARS Coronavirus 2: NEGATIVE

## 2020-01-19 LAB — LACTIC ACID, PLASMA
Lactic Acid, Venous: 1.7 mmol/L (ref 0.5–1.9)
Lactic Acid, Venous: 1.6 mmol/L (ref 0.5–1.9)

## 2020-01-19 LAB — TSH: TSH: 1.471 u[IU]/mL (ref 0.350–4.500)

## 2020-01-19 MED ORDER — METOPROLOL TARTRATE 5 MG/5ML IV SOLN: 5.0000 mg | INTRAVENOUS | Status: DC | PRN

## 2020-01-19 MED ORDER — ONDANSETRON HCL 4 MG/2ML IJ SOLN: 4.0000 mg | Freq: Four times a day (QID) | INTRAMUSCULAR | Status: DC | PRN

## 2020-01-19 MED ORDER — PANTOPRAZOLE SODIUM 40 MG IV SOLR
40.0000 mg | INTRAVENOUS | Status: DC
  Administered 2020-01-19 – 2020-01-23 (×5): 40 mg via INTRAVENOUS
  Filled 2020-01-19 (×5): qty 40

## 2020-01-19 MED ORDER — PIPERACILLIN-TAZOBACTAM 3.375 G IVPB
3.3750 g | Freq: Three times a day (TID) | INTRAVENOUS | Status: DC
  Administered 2020-01-19 – 2020-01-24 (×13): 3.375 g via INTRAVENOUS
  Filled 2020-01-19 (×13): qty 50

## 2020-01-19 MED ORDER — HYDRALAZINE HCL 20 MG/ML IJ SOLN
10.0000 mg | Freq: Four times a day (QID) | INTRAMUSCULAR | Status: DC | PRN
  Filled 2020-01-19: qty 0.5

## 2020-01-19 MED ORDER — DILTIAZEM HCL-DEXTROSE 125-5 MG/125ML-% IV SOLN (PREMIX)
5.0000 mg/h | INTRAVENOUS | Status: DC
  Administered 2020-01-19: 5 mg/h via INTRAVENOUS
  Filled 2020-01-19: qty 125

## 2020-01-19 MED ORDER — FENTANYL CITRATE (PF) 100 MCG/2ML IJ SOLN: 12.5000 ug | INTRAMUSCULAR | Status: DC | PRN

## 2020-01-19 MED ORDER — POTASSIUM CHLORIDE IN NACL 20-0.9 MEQ/L-% IV SOLN
INTRAVENOUS | Status: DC
  Filled 2020-01-19 (×5): qty 1000

## 2020-01-19 MED ORDER — DILTIAZEM LOAD VIA INFUSION
5.0000 mg | Freq: Once | INTRAVENOUS | Status: AC
  Administered 2020-01-19: 5 mg via INTRAVENOUS
  Filled 2020-01-19: qty 5

## 2020-01-19 MED ORDER — METRONIDAZOLE IN NACL 5-0.79 MG/ML-% IV SOLN
500.0000 mg | Freq: Three times a day (TID) | INTRAVENOUS | Status: DC
  Administered 2020-01-19 – 2020-01-20 (×3): 500 mg via INTRAVENOUS
  Filled 2020-01-19 (×5): qty 100

## 2020-01-19 MED ORDER — ACETAMINOPHEN 325 MG PO TABS
650.0000 mg | ORAL_TABLET | Freq: Four times a day (QID) | ORAL | Status: DC | PRN
  Administered 2020-01-20: 650 mg via ORAL
  Filled 2020-01-19: qty 2

## 2020-01-19 MED ORDER — ACETAMINOPHEN 650 MG RE SUPP: 650.0000 mg | Freq: Four times a day (QID) | RECTAL | Status: DC | PRN

## 2020-01-19 MED ORDER — LACTATED RINGERS IV BOLUS
500.0000 mL | Freq: Once | INTRAVENOUS | Status: AC
  Administered 2020-01-19: 500 mL via INTRAVENOUS

## 2020-01-19 MED ORDER — PIPERACILLIN-TAZOBACTAM 3.375 G IVPB 30 MIN
3.3750 g | Freq: Once | INTRAVENOUS | Status: AC
  Administered 2020-01-19: 3.375 g via INTRAVENOUS
  Filled 2020-01-19: qty 50

## 2020-01-19 MED ORDER — METOPROLOL TARTRATE 5 MG/5ML IV SOLN
5.0000 mg | Freq: Four times a day (QID) | INTRAVENOUS | Status: DC
  Administered 2020-01-19 – 2020-01-23 (×10): 5 mg via INTRAVENOUS
  Filled 2020-01-19 (×12): qty 5

## 2020-01-19 MED ORDER — IOHEXOL 300 MG/ML  SOLN
75.0000 mL | Freq: Once | INTRAMUSCULAR | Status: AC | PRN
  Administered 2020-01-19: 75 mL via INTRAVENOUS

## 2020-01-19 NOTE — ED Notes (Signed)
Dr Charna Archer, Md aware of pt's HR. Pt in afib with rate 149-123. Pt denies CP or SHOB. Pt with hx of afib. See order for LR 500 ml bolus.

## 2020-01-19 NOTE — ED Notes (Signed)
Dr Charna Archer at bedside to update

## 2020-01-19 NOTE — ED Notes (Signed)
Spoke to [employer] on the floor, informed to call back with questions regarding pt report

## 2020-01-19 NOTE — ED Notes (Signed)
Per [place] NP notify provider for SBP <90 and HR <60. Metoprolol given for rate control

## 2020-01-19 NOTE — Consult Note (Signed)
Reason for Consult: Abdominal pain, diverticulitis Referring Physician: Blake Divine, MD (emergency medicine)  Jeanne Pittman is an 85 y.o. female.  HPI: She presented to the emergency department today after an episode of syncope.  She had been experiencing lower abdominal pain since Friday.  She thought she had hemorrhoids and used a suppository.  This did result in a bowel movement, but did not alleviate her pain.  She has not eaten anything since Friday, but has been drinking water.  She says that her lower abdominal pain is the primary reason for her lack of appetite.  She says that she had a "fever" of 98.8, accompanied by chills.  She does report that her baseline normal temperature has been around 43 F ever since her chemotherapy for non-Hodgkin's lymphoma.  No nausea or vomiting.  No chest pain or dyspnea.  She was in atrial fibrillation with RVR during her initial evaluation.  She has converted to normal sinus rhythm at the time of my evaluation.  A CT scan performed in the emergency department demonstrated diverticulitis with a possible small abscess.  No free air was seen.  There was some gallbladder distention, and a right upper quadrant ultrasound is pending.  She denies any epigastric or right upper quadrant pain.  General surgery has been consulted for further evaluation and management of diverticulitis.  Past Medical History:  Diagnosis Date  . A-fib ([place])    07/2012: A. fib with RVR in the setting of myocardial infarction. Converted to sinus rhythm with amiodarone. No recurrence.  . Anxiety   . Atrophic vaginitis   . CHF (congestive heart failure) ([place])   . Chronic combined systolic (congestive) and diastolic (congestive) heart failure ([place])    a. EF was 25-35% post MI but improved to 45-50% in 04/2013; b. 03/2017 Echo: EF 40-45%, mod focal/basal hypertrophy of septum. Sev mid-apicalanteroseptal, ant, and apical HK. Gr1 DD, mild AI/MR, mod TR, PASP 37mHg.  . Colon adenomas   .  Coronary artery disease    a. [id number] STEMI complicated by cardiogenic shock. Cath/PCI: LAD 174m (DESx2), RCA 95p(DES). EF 25%; b. 03/2017 MV: EF 57%, fixed apical, periapical, mid to distal anteroseptal defect. No ischemia.  . Eczema   . Fibrocystic breast disease   . Gastritis   . GERD (gastroesophageal reflux disease)   . Glaucoma   . Headache   . History of gastritis   . Hyperlipidemia   . Hypertension   . Hypothyroidism   . Insomnia   . Ischemic cardiomyopathy    a. 07/2012 EF 25-35% following MI-->Improved to 45-50%;  b. 03/2017 Echo: EF 40-45%.  [name] Lymphoma ([place])   . Myocardial infarction ([place]) 08/18/2014  . Non Hodgkin's lymphoma ([place]) 2005   reoccurance 2007 and 2012  . Osteoarthritis   . Osteoporosis   . Polyneuropathy   . Polyposis of colon   . Restless leg syndrome   . Urinary, incontinence, stress female     Past Surgical History:  Procedure Laterality Date  . ABDOMINAL HYSTERECTOMY  1956  . APPENDECTOMY    . AXILLARY LYMPH NODE BIOPSY Left 03/18/2015   Procedure: AXILLARY LYMPH NODE BIOPSY;  Surgeon: [name], MD;  Location: [name];  Service: General;  Laterality: Left;  . CARDIAC CATHETERIZATION  08/19/2012   [name]; [place]  . COLONOSCOPY    . COLONOSCOPY WITH PROPOFOL N/A 03/02/2016   Procedure: COLONOSCOPY WITH PROPOFOL;  Surgeon: Manya Silvas, MD;  Location: [facility] ENDOSCOPY;  Service: Endoscopy;  Laterality: N/A;  .  COLONOSCOPY WITH PROPOFOL N/A 09/26/2017   Procedure: COLONOSCOPY WITH PROPOFOL;  Surgeon: Manya Silvas, MD;  Location: [facility] ENDOSCOPY;  Service: Endoscopy;  Laterality: N/A;  . CORONARY ANGIOPLASTY WITH STENT PLACEMENT     x3 stents  . CORONARY ANGIOPLASTY WITH STENT PLACEMENT    . CORONARY ARTERY BYPASS GRAFT    . ESOPHAGOGASTRODUODENOSCOPY (EGD) WITH PROPOFOL N/A 03/02/2016   Procedure: ESOPHAGOGASTRODUODENOSCOPY (EGD) WITH PROPOFOL;  Surgeon: Manya Silvas, MD;  Location: [facility] ENDOSCOPY;  Service: Endoscopy;  Laterality: N/A;   . ESOPHAGOGASTRODUODENOSCOPY (EGD) WITH PROPOFOL N/A 04/14/2019   Procedure: ESOPHAGOGASTRODUODENOSCOPY (EGD) WITH PROPOFOL;  Surgeon: Tol[place], Benay Pike, MD;  Location: [name] ENDOSCOPY;  Service: Gastroenterology;  Laterality: N/A;  . LYMPH NODE BIOPSY Right 07-24-11   Dr Bary Castilla  . MOHS SURGERY     bilateral shoulders  . PTCA    . skin cancer removal    . TOTAL ABDOMINAL HYSTERECTOMY W/ BILATERAL SALPINGOOPHORECTOMY      Family History  Problem Relation Age of Onset  . Heart attack Father   . Heart disease Brother   . Leukemia Mother   . Bladder Cancer Neg Hx   . Prostate cancer Neg Hx   . Kidney cancer Neg Hx   . Breast cancer Neg Hx     Social History:  reports that she quit smoking about 71 years ago. Her smoking use included cigarettes. She has never used smokeless tobacco. She reports that she does not drink alcohol or use drugs.  Allergies:  Allergies  Allergen Reactions  . Ace Inhibitors     Other reaction(s): Cough  . Benadryl [Diphenhydramine Hcl]     Other reaction(s): Unknown  . Codeine     Other reaction(s): Hallucination  . Diphenhydramine   . Guaiacol   . Hydrocodone     Other reaction(s): Hallucination  . Guaifenesin & Derivatives Rash    Medications: I have reviewed the patient's current medications.  Results for orders placed or performed during the hospital encounter of 01/19/20 (from the past 48 hour(s))  CBC with Differential     Status: Abnormal   Collection Time: 01/19/20  1:09 PM  Result Value Ref Range   WBC 15.2 (H) 4.0 - 10.5 K/uL   RBC 3.71 (L) 3.87 - 5.11 MIL/uL   Hemoglobin 12.3 12.0 - 15.0 g/dL   HCT 35.5 (L) 36.0 - 46.0 %   MCV 95.7 80.0 - 100.0 fL   MCH 33.2 26.0 - 34.0 pg   MCHC 34.6 30.0 - 36.0 g/dL   RDW 14.8 11.5 - 15.5 %   Platelets 88 (L) 150 - 400 K/uL    Comment: Immature Platelet Fraction may be clinically indicated, consider ordering this additional test [id number]    nRBC 0.0 0.0 - 0.2 %   Neutrophils Relative % 62  %   Neutro Abs 9.4 (H) 1.7 - 7.7 K/uL   Lymphocytes Relative 33 %   Lymphs Abs 5.0 (H) 0.7 - 4.0 K/uL   Monocytes Relative 4 %   Monocytes Absolute 0.7 0.1 - 1.0 K/uL   Eosinophils Relative 0 %   Eosinophils Absolute 0.0 0.0 - 0.5 K/uL   Basophils Relative 0 %   Basophils Absolute 0.0 0.0 - 0.1 K/uL   Immature Granulocytes 1 %   Abs Immature Granulocytes 0.07 0.00 - 0.07 K/uL    Comment: Performed at [facility], [address] [place], [place] [address]  Comprehensive metabolic panel     Status: Abnormal   Collection Time: 01/19/20  1:09 PM  Result Value Ref Range   Sodium 134 (L) 135 - 145 mmol/L   Potassium 3.9 3.5 - 5.1 mmol/L   Chloride 104 98 - 111 mmol/L   CO2 21 (L) 22 - 32 mmol/L   Glucose, Bld 105 (H) 70 - 99 mg/dL    Comment: Glucose reference range applies only to samples taken after fasting for at least 8 hours.   BUN 20 8 - 23 mg/dL   Creatinine, Ser 0.69 0.44 - 1.00 mg/dL   Calcium 8.2 (L) 8.9 - 10.3 mg/dL   Total Protein 6.3 (L) 6.5 - 8.1 g/dL   Albumin 3.2 (L) 3.5 - 5.0 g/dL   AST 13 (L) 15 - 41 U/L   ALT 10 0 - 44 U/L   Alkaline Phosphatase 42 38 - 126 U/L   Total Bilirubin 1.7 (H) 0.3 - 1.2 mg/dL   GFR calc non Af Amer >60 >60 mL/min   GFR calc Af Amer >60 >60 mL/min   Anion gap 9 5 - 15    Comment: Performed at [facility], [address] [place], [place] [address]  Lipase, blood     Status: None   Collection Time: 01/19/20  1:09 PM  Result Value Ref Range   Lipase 30 11 - 51 U/L    Comment: Performed at [facility], [address] [place], [place] [address]  Urinalysis, Complete w Microscopic     Status: Abnormal   Collection Time: 01/19/20  1:09 PM  Result Value Ref Range   Color, Urine YELLOW (A) YELLOW   APPearance CLEAR (A) CLEAR   Specific Gravity, Urine 1.013 1.005 - 1.030   pH 7.0 5.0 - 8.0   Glucose, UA NEGATIVE NEGATIVE mg/dL   Hgb urine dipstick NEGATIVE NEGATIVE   Bilirubin Urine NEGATIVE NEGATIVE    Ketones, ur NEGATIVE NEGATIVE mg/dL   Protein, ur 30 (A) NEGATIVE mg/dL   Nitrite NEGATIVE NEGATIVE   Leukocytes,Ua TRACE (A) NEGATIVE   RBC / HPF 0-5 0 - 5 RBC/hpf   WBC, UA 6-10 0 - 5 WBC/hpf   Bacteria, UA NONE SEEN NONE SEEN   Squamous Epithelial / LPF 0-5 0 - 5    Comment: Performed at [facility], [place], [place] [address]  Troponin I (High Sensitivity)     Status: None   Collection Time: 01/19/20  1:09 PM  Result Value Ref Range   Troponin I (High Sensitivity) 8 <18 ng/L    Comment: (NOTE) Elevated high sensitivity troponin I (hsTnI) values and significant  changes across serial measurements may suggest ACS but many other  chronic and acute conditions are known to elevate hsTnI results.  Refer to the "Links" section for chest pain algorithms and additional  guidance. Performed at [facility], [address] [place], [place] [address]   Magnesium     Status: None   Collection Time: 01/19/20  1:09 PM  Result Value Ref Range   Magnesium 1.9 1.7 - 2.4 mg/dL    Comment: Performed at [facility], [address] [place], [place] [address]    CT Head Wo Contrast  Result Date: 01/19/2020 CLINICAL DATA:  Status post head trauma.  Abdominal pain. EXAM: CT HEAD WITHOUT CONTRAST CT CERVICAL SPINE WITHOUT CONTRAST TECHNIQUE: Multidetector CT imaging of the head and cervical spine was performed following the standard protocol without intravenous contrast. Multiplanar CT image reconstructions of the cervical spine were also generated. COMPARISON:  None. FINDINGS: CT  HEAD FINDINGS Brain: No evidence of acute infarction, hemorrhage, extra-axial collection, ventriculomegaly, or mass effect. Generalized cerebral atrophy. Periventricular white matter low attenuation likely secondary to microangiopathy. Vascular: Cerebrovascular atherosclerotic calcifications are noted. Skull: Negative for fracture or focal lesion. Sinuses/Orbits: Visualized  portions of the orbits are unremarkable. Visualized portions of the paranasal sinuses are unremarkable. Visualized portions of the mastoid air cells are unremarkable. Other: None. CT CERVICAL SPINE FINDINGS Alignment: 2 mm anterolisthesis of C7 on T1. Skull base and vertebrae: No acute fracture. No primary bone lesion or focal pathologic process. Soft tissues and spinal canal: No prevertebral fluid or swelling. No visible canal hematoma. Disc levels: Degenerative disease with severe disc height loss at C3-4, C6-7 and to lesser extent C5-6. bilateral facet arthropathy throughout the cervical spine. At C2-3 there is mild left foraminal stenosis. At C3-4 there is bilateral foraminal stenosis. At C4-5 there is severe right and moderate left foraminal stenosis. At C5-6 there is mild bilateral foraminal stenosis. At C6-7 there is bilateral severe foraminal stenosis. Upper chest: Lung apices are clear. Other: No fluid collection or hematoma. 8 mm hypodense thyroid mass in the isthmus of the thyroid unchanged compared with 12/06/2017. not clinically significant; no follow-up imaging recommended (ref: J Am Coll Radiol. 2015 Feb;12(2): 143-50). Bilateral carotid artery atherosclerosis. IMPRESSION: 1. No acute intracranial pathology. 2. No acute osseous injury of the cervical spine. 3. Cervical spine spondylosis as described above. Electronically Signed   By: Kathreen Devoid   On: 01/19/2020 14:54   CT Cervical Spine Wo Contrast  Result Date: 01/19/2020 CLINICAL DATA:  Status post head trauma.  Abdominal pain. EXAM: CT HEAD WITHOUT CONTRAST CT CERVICAL SPINE WITHOUT CONTRAST TECHNIQUE: Multidetector CT imaging of the head and cervical spine was performed following the standard protocol without intravenous contrast. Multiplanar CT image reconstructions of the cervical spine were also generated. COMPARISON:  None. FINDINGS: CT HEAD FINDINGS Brain: No evidence of acute infarction, hemorrhage, extra-axial collection,  ventriculomegaly, or mass effect. Generalized cerebral atrophy. Periventricular white matter low attenuation likely secondary to microangiopathy. Vascular: Cerebrovascular atherosclerotic calcifications are noted. Skull: Negative for fracture or focal lesion. Sinuses/Orbits: Visualized portions of the orbits are unremarkable. Visualized portions of the paranasal sinuses are unremarkable. Visualized portions of the mastoid air cells are unremarkable. Other: None. CT CERVICAL SPINE FINDINGS Alignment: 2 mm anterolisthesis of C7 on T1. Skull base and vertebrae: No acute fracture. No primary bone lesion or focal pathologic process. Soft tissues and spinal canal: No prevertebral fluid or swelling. No visible canal hematoma. Disc levels: Degenerative disease with severe disc height loss at C3-4, C6-7 and to lesser extent C5-6. bilateral facet arthropathy throughout the cervical spine. At C2-3 there is mild left foraminal stenosis. At C3-4 there is bilateral foraminal stenosis. At C4-5 there is severe right and moderate left foraminal stenosis. At C5-6 there is mild bilateral foraminal stenosis. At C6-7 there is bilateral severe foraminal stenosis. Upper chest: Lung apices are clear. Other: No fluid collection or hematoma. 8 mm hypodense thyroid mass in the isthmus of the thyroid unchanged compared with 12/06/2017. not clinically significant; no follow-up imaging recommended (ref: J Am Coll Radiol. 2015 Feb;12(2): 143-50). Bilateral carotid artery atherosclerosis. IMPRESSION: 1. No acute intracranial pathology. 2. No acute osseous injury of the cervical spine. 3. Cervical spine spondylosis as described above. Electronically Signed   By: Kathreen Devoid   On: 01/19/2020 14:54    Review of Systems  Constitutional: Positive for appetite change, chills and fever.  Cardiovascular: Positive for palpitations.  Gastrointestinal:  Positive for abdominal pain and constipation.  Neurological: Positive for syncope.  All other  systems reviewed and are negative. Or as discussed in the history of present illness.  Blood pressure 120/72, pulse (!) 152, temperature 98.3 F (36.8 C), temperature source Oral, resp. rate 16, height 5\' 1"  (1.549 m), weight 46.3 kg, SpO2 99 %.  Body mass index is 19.27 kg/m. Note: At the time of my evaluation, her heart rate was 92 and sinus rhythm via telemetry.  Physical Exam  Constitutional: She is oriented to person, place, and time. She appears well-developed and well-nourished. No distress.  HENT:  Head: Normocephalic and atraumatic.  Eyes: Right eye exhibits no discharge. Left eye exhibits no discharge. No scleral icterus.  Neck: No tracheal deviation present.  Cardiovascular: Normal rate and regular rhythm.  Respiratory: No stridor. She is in respiratory distress.  GI: Soft. There is abdominal tenderness.  Tender in bilateral lower quadrants, left more so than right.  No peritoneal signs.  Murphy sign is notably negative  Genitourinary:    Genitourinary Comments: Deferred   Musculoskeletal:        General: No deformity.  Neurological: She is alert and oriented to person, place, and time.  Skin: Skin is warm and dry.  Psychiatric: She has a normal mood and affect. Her behavior is normal.    Assessment/Plan: This is an 85 year old woman with lower abdominal pain and syncope.  CT scan has not been formally read by radiology, however my review and the telephone report to the emergency department provider is consistent with diverticulitis and a small abscess.  I think that this is the likely source of her leukocytosis and abdominal pain and that the CT findings related to her gallbladder are a red herring; I do not suspect acute cholecystitis.  The distention may simply be to lack of oral intake over the past several days and there are no significant laboratory abnormalities, aside from mild elevation of her total bilirubin.  Right upper quadrant ultrasound is pending, however, and  may change this impression.  Due to her multiple medical comorbidities, I recommend that she be admitted to the hospital medicine service.   -- She should be kept n.p.o. with IV fluids.   --She should be placed on broad-spectrum antibiotics for intra-abdominal coverage.  --General surgery will continue to follow.  I anticipate that she will respond appropriately to conservative management and not require operative intervention.  I did discuss briefly what surgical management would entail with both the patient and her son, who is at bedside.  Both endorsed that they would prefer to avoid surgery if possible.  [name] 01/19/2020, 4:15 PM

## 2020-01-19 NOTE — ED Notes (Signed)
Redness present at IV site where dilitazem is running. Redness extends up forearm. Pt reports it is itchy. MD Charna Archer at bedside, per MD stop dil. Dil stopped at this time.

## 2020-01-19 NOTE — H&P (Addendum)
History and Physical    Jeanne Pittman [id number] DOB: 05-Oct-1934 DOA: 01/19/2020  PCP: [name], MD  Patient coming from: home  I have personally briefly reviewed patient's old medical records in [place]  Chief Complaint: syncope  HPI: Jeanne Pittman is a 85 y.o. female with medical history significant for chronic combined systolic and diastolic CHF/ischemic cardiomyopathy (last EF from 07/2019 with improved EF of 45-50%), history of ST EMI s/p PCI with DES to LADx2 and RCA, GERD, hypertension, hypothyroidism, history of non-Hodgkin's lymphoma s/p chemotherapy (currently in remission) who presented to the ED with episode of syncope.  Patient reports having off-and-on cramping bilateral lower quadrant abdominal pain since 3 days.  Reports poor p.o. intake, subjective fever and chills with poor p.o. intake.  She reports being constipated for past few days and took a suppository yesterday afterwards had watery bowel movement.  She has been feeling weak for past few days. This morning when she was talking with her neighbor she felt dizzy and slumped on the floor.  Her neighbor thought she hit her chin on the table.  Patient reports the syncopal episode lasted for few seconds and got up.  No seizure activity, bowel or urinary incontinence.  Patient denies any headache, blurred vision, nausea, vomiting, chest pain, palpitations, shortness of breath, dysuria, hematemesis or melena.  Denies any change in her medications or diet.  No sick contacts.  Has received both vaccination for COVID-19.    ED Course:  In the ED blood pressure was stable but patient was in rapid A. fib with heart rate going up to 180s.  Afebrile and O2 sat are 98% on room air.  Patient started on IV Cardizem drip started having itching on the IV site and was stopped.  She was being administered IV Lopressor but converted to sinus rhythm shortly in the ED. Given 500 cc Ringer lactate bolus. Blood work showed CBC  of 15 K, normal hemoglobin, platelets of 88.  Chemistry including LFTs were normal.  EKG showed normal sinus rhythm at 95 with poor R wave progression, normal QTC. A CT of the head and cervical spine without any acute findings.  (CT of the cervical spine showed C3-C7 disc degeneration with mild foraminal stenosis, 8 mm hypodense thyroid mass unchanged from prior). CT of the abdomen showed gallbladder wall thickening without cholelithiasis.  Thickening of the sigmoid colon suggestive of diverticulitis.  Also showed 1.5 cm fluid collection on the right colon. Patient given empiric IV Zosyn.  ED physician discussed with surgeon who recommended empiric IV antibiotic, keep n.p.o. and monitor. Patient admitted to medical floor.   Review of Systems: As per HPI otherwise all other systems reviewed and are negative.   Past Medical History:  Diagnosis Date  . A-fib ([place])    07/2012: A. fib with RVR in the setting of myocardial infarction. Converted to sinus rhythm with amiodarone. No recurrence.  . Anxiety   . Atrophic vaginitis   . CHF (congestive heart failure) ([place])   . Chronic combined systolic (congestive) and diastolic (congestive) heart failure ([place])    a. EF was 25-35% post MI but improved to 45-50% in 04/2013; b. 03/2017 Echo: EF 40-45%, mod focal/basal hypertrophy of septum. Sev mid-apicalanteroseptal, ant, and apical HK. Gr1 DD, mild AI/MR, mod TR, PASP 54mHg.  . Colon adenomas   . Coronary artery disease    a. [id number] STEMI complicated by cardiogenic shock. Cath/PCI: LAD 121m (DESx2), RCA 95p(DES). EF 25%; b. 03/2017 MV: EF  57%, fixed apical, periapical, mid to distal anteroseptal defect. No ischemia.  . Eczema   . Fibrocystic breast disease   . Gastritis   . GERD (gastroesophageal reflux disease)   . Glaucoma   . Headache   . History of gastritis   . Hyperlipidemia   . Hypertension   . Hypothyroidism   . Insomnia   . Ischemic cardiomyopathy    a. 07/2012 EF 25-35% following  MI-->Improved to 45-50%;  b. 03/2017 Echo: EF 40-45%.  [name] Lymphoma ([place])   . Myocardial infarction ([place]) 08/18/2014  . Non Hodgkin's lymphoma ([place]) 2005   reoccurance 2007 and 2012  . Osteoarthritis   . Osteoporosis   . Polyneuropathy   . Polyposis of colon   . Restless leg syndrome   . Urinary, incontinence, stress female     Past Surgical History:  Procedure Laterality Date  . ABDOMINAL HYSTERECTOMY  1956  . APPENDECTOMY    . AXILLARY LYMPH NODE BIOPSY Left 03/18/2015   Procedure: AXILLARY LYMPH NODE BIOPSY;  Surgeon: [name], MD;  Location: [name];  Service: General;  Laterality: Left;  . CARDIAC CATHETERIZATION  08/19/2012   [name]; [place]  . COLONOSCOPY    . COLONOSCOPY WITH PROPOFOL N/A 03/02/2016   Procedure: COLONOSCOPY WITH PROPOFOL;  Surgeon: Manya Silvas, MD;  Location: [facility] ENDOSCOPY;  Service: Endoscopy;  Laterality: N/A;  . COLONOSCOPY WITH PROPOFOL N/A 09/26/2017   Procedure: COLONOSCOPY WITH PROPOFOL;  Surgeon: Manya Silvas, MD;  Location: [facility] ENDOSCOPY;  Service: Endoscopy;  Laterality: N/A;  . CORONARY ANGIOPLASTY WITH STENT PLACEMENT     x3 stents  . CORONARY ANGIOPLASTY WITH STENT PLACEMENT    . CORONARY ARTERY BYPASS GRAFT    . ESOPHAGOGASTRODUODENOSCOPY (EGD) WITH PROPOFOL N/A 03/02/2016   Procedure: ESOPHAGOGASTRODUODENOSCOPY (EGD) WITH PROPOFOL;  Surgeon: Manya Silvas, MD;  Location: [facility] ENDOSCOPY;  Service: Endoscopy;  Laterality: N/A;  . ESOPHAGOGASTRODUODENOSCOPY (EGD) WITH PROPOFOL N/A 04/14/2019   Procedure: ESOPHAGOGASTRODUODENOSCOPY (EGD) WITH PROPOFOL;  Surgeon: Toledo, Benay Pike, MD;  Location: [name] ENDOSCOPY;  Service: Gastroenterology;  Laterality: N/A;  . LYMPH NODE BIOPSY Right 07-24-11   Dr Bary Castilla  . MOHS SURGERY     bilateral shoulders  . PTCA    . skin cancer removal    . TOTAL ABDOMINAL HYSTERECTOMY W/ BILATERAL SALPINGOOPHORECTOMY      Social History  reports that she quit smoking about 71 years ago. Her smoking use  included cigarettes. She has never used smokeless tobacco. She reports that she does not drink alcohol or use drugs.  Allergies  Allergen Reactions  . Ace Inhibitors     Other reaction(s): Cough  . Benadryl [Diphenhydramine Hcl]     Other reaction(s): Unknown  . Codeine     Other reaction(s): Hallucination  . Diphenhydramine   . Guaiacol   . Hydrocodone     Other reaction(s): Hallucination  . Cardizem [Diltiazem] Rash  . Guaifenesin & Derivatives Rash    Family History  Problem Relation Age of Onset  . Heart attack Father   . Heart disease Brother   . Leukemia Mother   . Bladder Cancer Neg Hx   . Prostate cancer Neg Hx   . Kidney cancer Neg Hx   . Breast cancer Neg Hx      Prior to Admission medications   Medication Sig Start Date End Date Taking? Authorizing Provider  aspirin 81 MG tablet Take 81 mg by mouth every morning.    Yes [provider]  carvedilol (  COREG) 3.125 MG tablet TAKE 1 TABLET BY MOUTH TWICE DAILY WITH A MEAL Patient taking differently: Take 3.125 mg by mouth 2 (two) times daily with a meal. TAKE 1 TABLET BY MOUTH TWICE DAILY WITH A MEAL 11/06/19  Yes Wellington Hampshire, MD  Cholecalciferol (VITAMIN D-3) 5000 UNITS TABS Take 2,000 Int'l Units by mouth every morning.    Yes [provider]  furosemide (LASIX) 20 MG tablet Take 1 tablet (20 mg total) by mouth daily as needed. 08/25/19  Yes Theora Gianotti, NP  latanoprost (XALATAN) 0.005 % ophthalmic solution Place 1 drop into both eyes at bedtime.  07/15/14  Yes [provider]  LORazepam (ATIVAN) 0.5 MG tablet Take 0.5 mg by mouth every 8 (eight) hours as needed for anxiety.    Yes [provider]  losartan (COZAAR) 25 MG tablet TAKE 1/2 TABLET(12.5 MG) BY MOUTH DAILY AFTER BREAKFAST 09/02/19  Yes Theora Gianotti, NP  pantoprazole (PROTONIX) 40 MG tablet TAKE 1 TABLET(40 MG) BY MOUTH TWICE DAILY 11/12/19  Yes Theora Gianotti, NP  rosuvastatin  (CRESTOR) 10 MG tablet TAKE 1 TABLET(10 MG) BY MOUTH EVERY OTHER DAY 07/23/19  Yes Theora Gianotti, NP  timolol (TIMOPTIC) 0.5 % ophthalmic solution Place 1 drop into both eyes 2 (two) times daily.  06/15/14  Yes [provider]  acetaminophen (TYLENOL) 650 MG CR tablet Take 650 mg by mouth every 8 (eight) hours as needed for pain.    [provider]  fexofenadine (ALLEGRA) 180 MG tablet Take 180 mg by mouth daily as needed for rhinitis.     [provider]  gabapentin (NEURONTIN) 100 MG capsule Take 100 mg by mouth 3 (three) times daily. 12/24/19   [provider]  valACYclovir (VALTREX) 1000 MG tablet Take 1,000 mg by mouth 2 (two) times daily. 12/25/19   [provider]    Physical Exam: Vitals:   01/19/20 1358 01/19/20 1400 01/19/20 1454 01/19/20 1500  BP: 122/68 118/71 121/73 120/72  Pulse:   (!) 152   Resp:  (!) 21 20 16   Temp:      TempSrc:      SpO2:   99%   Weight:      Height:        Constitutional: NAD, calm, comfortable Vitals:   01/19/20 1358 01/19/20 1400 01/19/20 1454 01/19/20 1500  BP: 122/68 118/71 121/73 120/72  Pulse:   (!) 152   Resp:  (!) 21 20 16   Temp:      TempSrc:      SpO2:   99%   Weight:      Height:       General: Elderly female lying in bed appears fatigued, in no acute distress HEENT: There was reactive bilaterally, EOMI, no pallor, no icterus, dry mucosa, supple neck, no cervical lymphadenopathy Chest: Clear to auscultation bilaterally, no added sound CVs: Normal S1-S2, no murmurs rub or gallop GI: Soft, nondistended, bowel sounds present, tenderness on minimal pressure over bilateral lower quadrant, no right upper quadrant tenderness Musculoskeletal: Warm, no edema, chronic bruising of the skin CNS: Alert and oriented, nonfocal  Labs on Admission: I have personally reviewed following labs and imaging studies  CBC: Recent Labs  Lab 01/19/20 1309  WBC 15.2*  NEUTROABS 9.4*  HGB 12.3  HCT  35.5*  MCV 95.7  PLT 88*    Basic Metabolic Panel: Recent Labs  Lab 01/19/20 1309  NA 134*  K 3.9  CL 104  CO2 21*  GLUCOSE 105*  BUN 20  CREATININE 0.69  CALCIUM 8.2*  MG 1.9    GFR: Estimated Creatinine Clearance: 39.6 mL/min (by C-G formula based on SCr of 0.69 mg/dL).  Liver Function Tests: Recent Labs  Lab 01/19/20 1309  AST 13*  ALT 10  ALKPHOS 42  BILITOT 1.7*  PROT 6.3*  ALBUMIN 3.2*    Urine analysis:    Component Value Date/Time   COLORURINE YELLOW (A) 01/19/2020 1309   APPEARANCEUR CLEAR (A) 01/19/2020 1309   APPEARANCEUR Clear 10/10/2016 1340   LABSPEC 1.013 01/19/2020 1309   PHURINE 7.0 01/19/2020 1309   GLUCOSEU NEGATIVE 01/19/2020 1309   HGBUR NEGATIVE 01/19/2020 1309   BILIRUBINUR NEGATIVE 01/19/2020 1309   BILIRUBINUR Negative 10/10/2016 1340   KETONESUR NEGATIVE 01/19/2020 1309   PROTEINUR 30 (A) 01/19/2020 1309   NITRITE NEGATIVE 01/19/2020 1309   LEUKOCYTESUR TRACE (A) 01/19/2020 1309    Radiological Exams on Admission: CT Head Wo Contrast  Result Date: 01/19/2020 CLINICAL DATA:  Status post head trauma.  Abdominal pain. EXAM: CT HEAD WITHOUT CONTRAST CT CERVICAL SPINE WITHOUT CONTRAST TECHNIQUE: Multidetector CT imaging of the head and cervical spine was performed following the standard protocol without intravenous contrast. Multiplanar CT image reconstructions of the cervical spine were also generated. COMPARISON:  None. FINDINGS: CT HEAD FINDINGS Brain: No evidence of acute infarction, hemorrhage, extra-axial collection, ventriculomegaly, or mass effect. Generalized cerebral atrophy. Periventricular white matter low attenuation likely secondary to microangiopathy. Vascular: Cerebrovascular atherosclerotic calcifications are noted. Skull: Negative for fracture or focal lesion. Sinuses/Orbits: Visualized portions of the orbits are unremarkable. Visualized portions of the paranasal sinuses are unremarkable. Visualized portions of the mastoid  air cells are unremarkable. Other: None. CT CERVICAL SPINE FINDINGS Alignment: 2 mm anterolisthesis of C7 on T1. Skull base and vertebrae: No acute fracture. No primary bone lesion or focal pathologic process. Soft tissues and spinal canal: No prevertebral fluid or swelling. No visible canal hematoma. Disc levels: Degenerative disease with severe disc height loss at C3-4, C6-7 and to lesser extent C5-6. bilateral facet arthropathy throughout the cervical spine. At C2-3 there is mild left foraminal stenosis. At C3-4 there is bilateral foraminal stenosis. At C4-5 there is severe right and moderate left foraminal stenosis. At C5-6 there is mild bilateral foraminal stenosis. At C6-7 there is bilateral severe foraminal stenosis. Upper chest: Lung apices are clear. Other: No fluid collection or hematoma. 8 mm hypodense thyroid mass in the isthmus of the thyroid unchanged compared with 12/06/2017. not clinically significant; no follow-up imaging recommended (ref: J Am Coll Radiol. 2015 Feb;12(2): 143-50). Bilateral carotid artery atherosclerosis. IMPRESSION: 1. No acute intracranial pathology. 2. No acute osseous injury of the cervical spine. 3. Cervical spine spondylosis as described above. Electronically Signed   By: Kathreen Devoid   On: 01/19/2020 14:54   CT Cervical Spine Wo Contrast  Result Date: 01/19/2020 CLINICAL DATA:  Status post head trauma.  Abdominal pain. EXAM: CT HEAD WITHOUT CONTRAST CT CERVICAL SPINE WITHOUT CONTRAST TECHNIQUE: Multidetector CT imaging of the head and cervical spine was performed following the standard protocol without intravenous contrast. Multiplanar CT image reconstructions of the cervical spine were also generated. COMPARISON:  None. FINDINGS: CT HEAD FINDINGS Brain: No evidence of acute infarction, hemorrhage, extra-axial collection, ventriculomegaly, or mass effect. Generalized cerebral atrophy. Periventricular white matter low attenuation likely secondary to microangiopathy.  Vascular: Cerebrovascular atherosclerotic calcifications are noted. Skull: Negative for fracture or focal lesion. Sinuses/Orbits: Visualized portions of the orbits are unremarkable. Visualized portions of the paranasal sinuses  are unremarkable. Visualized portions of the mastoid air cells are unremarkable. Other: None. CT CERVICAL SPINE FINDINGS Alignment: 2 mm anterolisthesis of C7 on T1. Skull base and vertebrae: No acute fracture. No primary bone lesion or focal pathologic process. Soft tissues and spinal canal: No prevertebral fluid or swelling. No visible canal hematoma. Disc levels: Degenerative disease with severe disc height loss at C3-4, C6-7 and to lesser extent C5-6. bilateral facet arthropathy throughout the cervical spine. At C2-3 there is mild left foraminal stenosis. At C3-4 there is bilateral foraminal stenosis. At C4-5 there is severe right and moderate left foraminal stenosis. At C5-6 there is mild bilateral foraminal stenosis. At C6-7 there is bilateral severe foraminal stenosis. Upper chest: Lung apices are clear. Other: No fluid collection or hematoma. 8 mm hypodense thyroid mass in the isthmus of the thyroid unchanged compared with 12/06/2017. not clinically significant; no follow-up imaging recommended (ref: J Am Coll Radiol. 2015 Feb;12(2): 143-50). Bilateral carotid artery atherosclerosis. IMPRESSION: 1. No acute intracranial pathology. 2. No acute osseous injury of the cervical spine. 3. Cervical spine spondylosis as described above. Electronically Signed   By: Kathreen Devoid   On: 01/19/2020 14:54    EKG: Normal sinus rhythm at 95, poor R wave progression, normal QTC  Assessment/Plan Principal Problem: SIRS secondary to sigmoid diverticulitis/right colon abscess Admit to MedSurg.  Keep n.p.o.  Patient appears dehydrated with poor p.o. intake.  IV fluids with normal saline.  Keep K >4.  Serial abdominal exam. Empiric IV Zosyn and Flagyl. I will consult IR for drainage of the right  colonic fluid/abscess.  General surgery consulted.  Will follow up with recommendations.  Active Problems: Atrial fibrillation with RVR (HCC) Prior history of A. fib in the setting of NSTEMI. Resolved on short dose of amiodarone. CHADS2vasc of 31 (age, female, CHF, history of MI, hypertension).  A. fib possibly triggered by infection.  Will hold off on anticoagulation as patient has thrombocytopenia. Check 2D echo and TSH.  Patient developed itching shortly after receiving IV Cardizem.  Currently rate controlled and in sinus rhythm.  Will place her on scheduled IV Lopressor.  Syncope Likely secondary to pain/dehydration.  A. fib on presentation.  EKG and initial troponin negative.  No chest pain symptoms or seizure activity.  Monitor on telemetry.  ?  Gallbladder distention with thickening Negative for gallstones on CT.  Does not have any right lower quadrant tenderness or impaired LFTs.  Follow right upper quadrant ultrasound.    Chronic systolic heart failure ([place]) Patient hypovolemic with poor p.o. intake.  Gentle IV fluids.  Strict I's/O and daily weight.  Holding home meds (Lasix, beta-blocker, ACE inhibitor and statin).  Follows with Dr. Fletcher Anon.    Coronary artery disease with history of MI s/p DES to LAD and RCA. Hold aspirin, beta-blocker and statin for now as patient is NPO.  History of non-Hodgkin's lymphoma Completed chemotherapy and in remission.  Thrombocytopenia Chronic.  Worsening from baseline.  Monitor for now.  DVT prophylaxis: SCDs Code Status:   Full code Family Communication:  Patient lives alone.  Son involved in care.  Will update  Disposition Plan:   Patient is from:  Home  Anticipated DC to:  Home  Anticipated DC date:  6/3  Anticipated DC barriers: Active infection/abscess   Consults called:  General surgery/IR Admission status:  Inpatient  Severity of Illness: Patient's admission status inpatient My clinical opinion patient presenting with systemic  inflammatory response syndrome secondary to diarrhea sigmoid diverticulitis and right colon abscess,  A. fib with RVR.  Patient is hemodynamically unstable and needs empiric IV antibiotic, IV fluids, serial abdominal exam and further abdominal imaging.  Patient will also needs to be evaluated for radiological drainage of right colon abscess.  Patient is at high risk for developing severe sepsis, recurrent A. fib with RVR causing hemodynamic instability.  Patient needs to be monitored in an inpatient setting for at least >2 midnight.   " The patient's presenting symptoms include SIRS with sigmoid diverticulitis/right colon abscess, A. fib with RVR. " The physical exam findings include rapid A. fib, abdominal tenderness, dehydration " The initial radiographic and laboratory data are stable diverticulitis, right colon abscess,?  Cholecystitis      Tamika Shropshire MD [facility]  How to contact the [facility] Attending or Consulting provider [place] or covering provider during after hours [place], for this patient?   1. Check the care team in [facility] and look for a) attending/consulting [facility] provider listed and b) the [facility] team listed 2. Log into [email or web address] and use [place]'s universal password to access. If you do not have the password, please contact the hospital operator. 3. Locate the [facility] provider you are looking for under [facility] and page to a number that you can be directly reached. 4. If you still have difficulty reaching the provider, please page the [facility] (Director on Call) for the Hospitalists listed on amion for assistance.  01/19/2020, 5:00 PM

## 2020-01-19 NOTE — ED Notes (Signed)
Pt back from CT, son at bedside. Son updated.

## 2020-01-19 NOTE — ED Provider Notes (Addendum)
[facility] Emergency Department Provider Note   ____________________________________________   First MD Initiated Contact with Patient 01/19/20 1245     (approximate)  I have reviewed the triage vital signs and the nursing notes.   HISTORY  Chief Complaint Loss of Consciousness and Abdominal Pain    HPI Jeanne Pittman is a 85 y.o. female with possible history of CAD, CHF, non-Hodgkin's lymphoma, and paroxysmal atrial fibrillation who presents to the ED for syncope and abdominal pain.  Patient reports that she has had bilateral lower quadrant abdominal pain for the past 2 to 3 days, which she attributes to her hemorrhoids.  She does state that it has been hard for her to eat or drink anything due to this discomfort, although she has not felt nauseous and has not vomited.  She reports her stool was discolored, but describes this as lighter than usual with no blood or dark black stool.  She denies any fevers, cough, chest pain, shortness of breath, dysuria, or hematuria.  She was feeling very lightheaded earlier today and subsequently passed out, which was witnessed by a neighbor.  She was reportedly unconscious for a few seconds before returning to her baseline mental status.  On EMS arrival, she was noted to have heart rate varying between 60 and 180.        Past Medical History:  Diagnosis Date  . A-fib ([place])    07/2012: A. fib with RVR in the setting of myocardial infarction. Converted to sinus rhythm with amiodarone. No recurrence.  . Anxiety   . Atrophic vaginitis   . CHF (congestive heart failure) ([place])   . Chronic combined systolic (congestive) and diastolic (congestive) heart failure ([place])    a. EF was 25-35% post MI but improved to 45-50% in 04/2013; b. 03/2017 Echo: EF 40-45%, mod focal/basal hypertrophy of septum. Sev mid-apicalanteroseptal, ant, and apical HK. Gr1 DD, mild AI/MR, mod TR, PASP 45mHg.  . Colon adenomas   . Coronary artery disease      a. [id number] STEMI complicated by cardiogenic shock. Cath/PCI: LAD 112m (DESx2), RCA 95p(DES). EF 25%; b. 03/2017 MV: EF 57%, fixed apical, periapical, mid to distal anteroseptal defect. No ischemia.  . Eczema   . Fibrocystic breast disease   . Gastritis   . GERD (gastroesophageal reflux disease)   . Glaucoma   . Headache   . History of gastritis   . Hyperlipidemia   . Hypertension   . Hypothyroidism   . Insomnia   . Ischemic cardiomyopathy    a. 07/2012 EF 25-35% following MI-->Improved to 45-50%;  b. 03/2017 Echo: EF 40-45%.  [name] Lymphoma ([place])   . Myocardial infarction ([place]) 08/18/2014  . Non Hodgkin's lymphoma ([place]) 2005   reoccurance 2007 and 2012  . Osteoarthritis   . Osteoporosis   . Polyneuropathy   . Polyposis of colon   . Restless leg syndrome   . Urinary, incontinence, stress female     Patient Active Problem List   Diagnosis Date Noted  . 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 H/O neoplasm 01/04/2015  . Arterial vascular disease 12/30/2013  . BP (high blood pressure) 12/30/2013  . Adult hypothyroidism 12/30/2013  . OP (osteoporosis) 12/30/2013  . Avitaminosis D 12/30/2013  . Atrial fibrillation ([place]) 12/30/2013  . HLD (hyperlipidemia) 12/30/2013  . Preoperative cardiovascular examination 04/07/2013  . Hyperlipidemia 04/07/2013  . A-fib ([place])   . Coronary artery disease   . Chronic systolic heart failure ([place])  Past Surgical History:  Procedure Laterality Date  . ABDOMINAL HYSTERECTOMY  1956  . APPENDECTOMY    . AXILLARY LYMPH NODE BIOPSY Left 03/18/2015   Procedure: AXILLARY LYMPH NODE BIOPSY;  Surgeon: [name], MD;  Location: [name];  Service: General;  Laterality: Left;  . CARDIAC CATHETERIZATION  08/19/2012   [name]; [place]  . COLONOSCOPY    . COLONOSCOPY WITH PROPOFOL N/A 03/02/2016   Procedure: COLONOSCOPY WITH PROPOFOL;  Surgeon: Manya Silvas, MD;  Location: [facility] ENDOSCOPY;  Service: Endoscopy;  Laterality:  N/A;  . COLONOSCOPY WITH PROPOFOL N/A 09/26/2017   Procedure: COLONOSCOPY WITH PROPOFOL;  Surgeon: Manya Silvas, MD;  Location: [facility] ENDOSCOPY;  Service: Endoscopy;  Laterality: N/A;  . CORONARY ANGIOPLASTY WITH STENT PLACEMENT     x3 stents  . CORONARY ANGIOPLASTY WITH STENT PLACEMENT    . CORONARY ARTERY BYPASS GRAFT    . ESOPHAGOGASTRODUODENOSCOPY (EGD) WITH PROPOFOL N/A 03/02/2016   Procedure: ESOPHAGOGASTRODUODENOSCOPY (EGD) WITH PROPOFOL;  Surgeon: Manya Silvas, MD;  Location: [facility] ENDOSCOPY;  Service: Endoscopy;  Laterality: N/A;  . ESOPHAGOGASTRODUODENOSCOPY (EGD) WITH PROPOFOL N/A 04/14/2019   Procedure: ESOPHAGOGASTRODUODENOSCOPY (EGD) WITH PROPOFOL;  Surgeon: Tol[place], Benay Pike, MD;  Location: [name] ENDOSCOPY;  Service: Gastroenterology;  Laterality: N/A;  . LYMPH NODE BIOPSY Right 07-24-11   Dr Bary Castilla  . MOHS SURGERY     bilateral shoulders  . PTCA    . skin cancer removal    . TOTAL ABDOMINAL HYSTERECTOMY W/ BILATERAL SALPINGOOPHORECTOMY      Prior to Admission medications   Medication Sig Start Date End Date Taking? Authorizing Provider  acetaminophen (TYLENOL) 650 MG CR tablet Take 650 mg by mouth every 8 (eight) hours as needed for pain.    [provider]  aspirin 81 MG tablet Take 81 mg by mouth every morning.     [provider]  carvedilol (COREG) 3.125 MG tablet TAKE 1 TABLET BY MOUTH TWICE DAILY WITH A MEAL 11/06/19   Wellington Hampshire, MD  Cholecalciferol (VITAMIN D-3) 5000 UNITS TABS Take 2,000 Int'l Units by mouth every morning.     [provider]  fexofenadine (ALLEGRA) 180 MG tablet Take 180 mg by mouth daily as needed for rhinitis.     [provider]  furosemide (LASIX) 20 MG tablet Take 1 tablet (20 mg total) by mouth daily as needed. 08/25/19   Theora Gianotti, NP  gabapentin (NEURONTIN) 300 MG capsule Take 300 mg by mouth as needed.    [provider]  latanoprost (XALATAN) 0.005 % ophthalmic solution  Place 1 drop into both eyes at bedtime.  07/15/14   [provider]  LORazepam (ATIVAN) 0.5 MG tablet Take 0.5 mg by mouth every 8 (eight) hours as needed for anxiety.     [provider]  losartan (COZAAR) 25 MG tablet TAKE 1/2 TABLET(12.5 MG) BY MOUTH DAILY AFTER BREAKFAST 09/02/19   Theora Gianotti, NP  pantoprazole (PROTONIX) 40 MG tablet TAKE 1 TABLET(40 MG) BY MOUTH TWICE DAILY 11/12/19   Theora Gianotti, NP  rosuvastatin (CRESTOR) 10 MG tablet TAKE 1 TABLET(10 MG) BY MOUTH EVERY OTHER DAY 07/23/19   Theora Gianotti, NP  timolol (TIMOPTIC) 0.5 % ophthalmic solution Place 1 drop into both eyes 2 (two) times daily.  06/15/14   [provider]    Allergies Ace inhibitors, Benadryl [diphenhydramine hcl], Codeine, Diphenhydramine, Guaiacol, Hydrocodone, and Guaifenesin & derivatives  Family History  Problem Relation Age of Onset  . Heart attack  Father   . Heart disease Brother   . Leukemia Mother   . Bladder Cancer Neg Hx   . Prostate cancer Neg Hx   . Kidney cancer Neg Hx   . Breast cancer Neg Hx     Social History Social History   Tobacco Use  . Smoking status: Former Smoker    Types: Cigarettes    Quit date: 1950    Years since quitting: 71.4  . Smokeless tobacco: Never Used  Substance Use Topics  . Alcohol use: No    Alcohol/week: 0.0 standard drinks  . Drug use: No    Review of Systems  Constitutional: No fever/chills Eyes: No visual changes. ENT: No sore throat. Cardiovascular: Denies chest pain.  Positive for syncope. Respiratory: Denies shortness of breath. Gastrointestinal: Positive for abdominal pain.  No nausea, no vomiting.  No diarrhea.  No constipation. Genitourinary: Negative for dysuria. Musculoskeletal: Negative for back pain. Skin: Negative for rash. Neurological: Negative for headaches, focal weakness or numbness.  ____________________________________________   PHYSICAL EXAM:  VITAL  SIGNS: ED Triage Vitals  Enc Vitals Group     BP      Pulse      Resp      Temp      Temp src      SpO2      Weight      Height      Head Circumference      Peak Flow      Pain Score      Pain Loc      Pain Edu?      Excl. in [place]?     Constitutional: Alert and oriented. Eyes: Conjunctivae are normal. Head: Atraumatic. Nose: No congestion/rhinnorhea. Mouth/Throat: Mucous membranes are moist. Neck: Normal ROM Cardiovascular: Tachycardic, irregularly irregular rhythm. Grossly normal heart sounds. Respiratory: Normal respiratory effort.  No retractions. Lungs CTAB. Gastrointestinal: Soft and tender to palpation in the bilateral lower quadrants. No distention. Genitourinary: deferred Musculoskeletal: No lower extremity tenderness nor edema. Neurologic:  Normal speech and language. No gross focal neurologic deficits are appreciated. Skin:  Skin is warm, dry and intact. No rash noted. Psychiatric: Mood and affect are normal. Speech and behavior are normal.  ____________________________________________   LABS (all labs ordered are listed, but only abnormal results are displayed)  Labs Reviewed  CBC WITH DIFFERENTIAL/PLATELET - Abnormal; Notable for the following components:      Result Value   WBC 15.2 (*)    RBC 3.71 (*)    HCT 35.5 (*)    Platelets 88 (*)    Neutro Abs 9.4 (*)    Lymphs Abs 5.0 (*)    All other components within normal limits  COMPREHENSIVE METABOLIC PANEL - Abnormal; Notable for the following components:   Sodium 134 (*)    CO2 21 (*)    Glucose, Bld 105 (*)    Calcium 8.2 (*)    Total Protein 6.3 (*)    Albumin 3.2 (*)    AST 13 (*)    Total Bilirubin 1.7 (*)    All other components within normal limits  URINALYSIS, COMPLETE (UACMP) WITH MICROSCOPIC - Abnormal; Notable for the following components:   Color, Urine YELLOW (*)    APPearance CLEAR (*)    Protein, ur 30 (*)    Leukocytes,Ua TRACE (*)    All other components within normal limits   SARS CORONAVIRUS 2 BY RT PCR ([facility])  LIPASE, BLOOD  MAGNESIUM  TROPONIN I (HIGH SENSITIVITY)  TROPONIN I (HIGH SENSITIVITY)   ____________________________________________  EKG  ED ECG REPORT I, Blake Divine, the attending physician, personally viewed and interpreted this ECG.   Date: 01/19/2020  EKG Time: 12:58  Rate: 154  Rhythm: atrial fibrillation, rate 154  Axis: LAD  Intervals:none  ST&T Change: Lateral ST depressions and T wave inversions  ED ECG REPORT I, Blake Divine, the attending physician, personally viewed and interpreted this ECG.   Date: 01/19/2020  EKG Time: 15:26  Rate: 95  Rhythm: normal sinus rhythm  Axis: LAD  Intervals:none  ST&T Change: None    PROCEDURES  Procedure(s) performed (including Critical Care):  .Critical Care Performed by: Blake Divine, MD Authorized by: Blake Divine, MD   Critical care provider statement:    Critical care time (minutes):  45   Critical care time was exclusive of:  Separately billable procedures and treating other patients and teaching time   Critical care was necessary to treat or prevent imminent or life-threatening deterioration of the following conditions:  Cardiac failure   Critical care was time spent personally by me on the following activities:  Discussions with consultants, evaluation of patient's response to treatment, examination of patient, ordering and performing treatments and interventions, ordering and review of laboratory studies, ordering and review of radiographic studies, pulse oximetry, re-evaluation of patient's condition, obtaining history from patient or surrogate and review of old charts   I assumed direction of critical care for this patient from another provider in my specialty: no       ____________________________________________   INITIAL IMPRESSION / ASSESSMENT AND PLAN / ED COURSE       85 year old female with history  of CAD, CHF, atrial fibrillation, and non-Hodgkin's lymphoma presents to the ED following 2 to 3 days of abdominal pain with poor appetite, started to feel lightheaded today and had a syncopal episode where she fell and hit her head.  She arrives awake and alert and at her baseline mental status, no focal neurologic deficits noted.  However, she appears to be in atrial fibrillation with RVR, blood pressure currently stable.  She is likely dehydrated and we will first hydrate with IV fluids, afterwards reevaluate need for rate control.  Plan to check CT head and C-spine given her fall, also check CT abdomen/pelvis given her abdominal tenderness.  ----------------------------------------- 3:12 PM on 01/19/2020 -----------------------------------------  Patient noted to have redness and itching around IV site where she is receiving diltiazem drip.  This raises concern for allergic reaction we will stop diltiazem drip in favor of metoprolol.  CT head and C-spine are negative for acute process, CT abdomen/pelvis results are pending.  ----------------------------------------- 4:04 PM on 01/19/2020 -----------------------------------------  Patient converted to normal sinus rhythm prior to receiving any metoprolol.  CT scan shows some wall thickening and small amount of pericholecystic fluid, however she has no tenderness in the area of her gallbladder.  All of her tenderness is in the left lower quadrant of her abdomen and there also signs of diverticulitis with potential 1.5 cm abscess, which would explain her abdominal pain.  Case discussed with Dr. Celine Ahr of general surgery, who will evaluate the patient but recommends antibiotics and hospitalist admission.  Patient remains hemodynamically stable following conversion to normal sinus rhythm, case discussed with hospitalist for admission.      ____________________________________________   FINAL CLINICAL IMPRESSION(S) / ED DIAGNOSES  Final  diagnoses:  Abdominal pain  Syncope, unspecified syncope type  Diverticulitis  Atrial fibrillation with RVR ([place])  ED Discharge Orders    None       Note:  This document was prepared using Dragon voice recognition software and may include unintentional dictation errors.   Blake Divine, MD 01/19/20 1607    Blake Divine, MD 02/05/20 1954

## 2020-01-19 NOTE — ED Notes (Signed)
This RN called floor to tell on the way with pt, reports RN with an emergency at this time,

## 2020-01-19 NOTE — ED Notes (Signed)
Pt assisted to toilet 

## 2020-01-19 NOTE — ED Notes (Signed)
Pt ambulatory to bathroom with standby assist. Small skin tear on left knee noted, bleeding controlled. No swelling present. Pt reports normal movement of joint.

## 2020-01-19 NOTE — ED Notes (Addendum)
Jeanne Pittman messaged for parameters for metoprolol d/t BP 118/55 and pulse 75. Awaiting response

## 2020-01-19 NOTE — ED Notes (Signed)
Pt taken to CT.

## 2020-01-19 NOTE — ED Triage Notes (Addendum)
Pt here via [facility] from home. Pt has been NPO since Friday, has been drinking some fluids, pt states she has hemorrhoids and used a glycerin suppository followed by abdominal pain. Last BM yesterday after suppository.   Today she felt weak and passed out, per neighbors she did hit her head, pt takes aspirin. Hx of afib. With EMS HR was 60-180 afib.   Pt received 581ml NS with EMS.

## 2020-01-19 NOTE — Progress Notes (Signed)
Pharmacy Antibiotic Note  Jeanne Pittman is a 85 y.o. female admitted on 01/19/2020 with intra abdominal.  Pharmacy has been consulted for Zosyn  dosing.  Plan: Zosyn 3.375g IV q8h (4 hour infusion).  Height: 5\' 1"  (154.9 cm) Weight: 46.3 kg (102 lb) IBW/kg (Calculated) : 47.8  Temp (24hrs), Avg:98.3 F (36.8 C), Min:98.3 F (36.8 C), Max:98.3 F (36.8 C)  Recent Labs  Lab 01/19/20 1309  WBC 15.2*  CREATININE 0.69    Estimated Creatinine Clearance: 39.6 mL/min (by C-G formula based on SCr of 0.69 mg/dL).    Allergies  Allergen Reactions  . Ace Inhibitors     Other reaction(s): Cough  . Benadryl [Diphenhydramine Hcl]     Other reaction(s): Unknown  . Codeine     Other reaction(s): Hallucination  . Diphenhydramine   . Guaiacol   . Hydrocodone     Other reaction(s): Hallucination  . Cardizem [Diltiazem] Rash  . Guaifenesin & Derivatives Rash    Antimicrobials this admission:   >>    >>   Dose adjustments this admission:   Microbiology results:  BCx:   UCx:    Sputum:    MRSA PCR:   Thank you for allowing pharmacy to be a part of this patient's care.  Katesha Eichel D 01/19/2020 5:13 PM

## 2020-01-20 ENCOUNTER — Inpatient Hospital Stay: Payer: Medicare Other

## 2020-01-20 ENCOUNTER — Inpatient Hospital Stay (HOSPITAL_COMMUNITY)
Admit: 2020-01-20 | Discharge: 2020-01-20 | Disposition: A | Discharge status: Home / Self Care | Payer: Medicare Other | Attending: Internal Medicine | Admitting: Internal Medicine

## 2020-01-20 DIAGNOSIS — I361 Nonrheumatic tricuspid (valve) insufficiency: Secondary | ICD-10-CM

## 2020-01-20 DIAGNOSIS — K5720 Diverticulitis of large intestine with perforation and abscess without bleeding: Principal | ICD-10-CM

## 2020-01-20 DIAGNOSIS — I351 Nonrheumatic aortic (valve) insufficiency: Secondary | ICD-10-CM

## 2020-01-20 DIAGNOSIS — I340 Nonrheumatic mitral (valve) insufficiency: Secondary | ICD-10-CM

## 2020-01-20 DIAGNOSIS — I493 Ventricular premature depolarization: Secondary | ICD-10-CM

## 2020-01-20 LAB — CBC
HCT: 28.6 % — ABNORMAL LOW (ref 36.0–46.0)
Hemoglobin: 10.1 g/dL — ABNORMAL LOW (ref 12.0–15.0)
MCH: 33.6 pg (ref 26.0–34.0)
MCHC: 35.3 g/dL (ref 30.0–36.0)
MCV: 95 fL (ref 80.0–100.0)
Platelets: 129 10*3/uL — ABNORMAL LOW (ref 150–400)
RBC: 3.01 MIL/uL — ABNORMAL LOW (ref 3.87–5.11)
RDW: 14.7 % (ref 11.5–15.5)
WBC: 8.9 10*3/uL (ref 4.0–10.5)
nRBC: 0 % (ref 0.0–0.2)

## 2020-01-20 LAB — BASIC METABOLIC PANEL
Anion gap: 8 (ref 5–15)
BUN: 18 mg/dL (ref 8–23)
CO2: 24 mmol/L (ref 22–32)
Calcium: 8.4 mg/dL — ABNORMAL LOW (ref 8.9–10.3)
Chloride: 107 mmol/L (ref 98–111)
Creatinine, Ser: 0.96 mg/dL (ref 0.44–1.00)
GFR calc Af Amer: >60 mL/min (ref >60)
GFR calc non Af Amer: 55 mL/min — ABNORMAL LOW (ref >60)
Glucose, Bld: 86 mg/dL (ref 70–99)
Potassium: 3.7 mmol/L (ref 3.5–5.1)
Sodium: 139 mmol/L (ref 135–145)

## 2020-01-20 LAB — MAGNESIUM: Magnesium: 2.1 mg/dL (ref 1.7–2.4)

## 2020-01-20 LAB — ECHOCARDIOGRAM COMPLETE
Height: 61 in
Weight: 1632 oz

## 2020-01-20 MED ORDER — MORPHINE SULFATE (PF) 2 MG/ML IV SOLN
2.0000 mg | INTRAVENOUS | Status: DC | PRN
  Administered 2020-01-20: 2 mg via INTRAVENOUS
  Filled 2020-01-20: qty 1

## 2020-01-20 MED ORDER — ALUM & MAG HYDROXIDE-SIMETH 200-200-20 MG/5ML PO SUSP
30.0000 mL | Freq: Four times a day (QID) | ORAL | Status: DC | PRN
  Administered 2020-01-20: 30 mL via ORAL
  Filled 2020-01-20: qty 30

## 2020-01-20 NOTE — Consult Note (Signed)
Reason for Consult: Abdominal pain, diverticulitis Referring Physician: Blake Divine, MD (emergency medicine)  Jeanne Pittman is an 85 y.o. female.  HPI: She presented to the emergency department today after an episode of syncope.  She had been experiencing lower abdominal pain since Friday.  She thought she had hemorrhoids and used a suppository.  This did result in a bowel movement, but did not alleviate her pain.  She has not eaten anything since Friday, but has been drinking water.  She says that her lower abdominal pain is the primary reason for her lack of appetite.  She says that she had a "fever" of 98.8, accompanied by chills.  She does report that her baseline normal temperature has been around 47 F ever since her chemotherapy for non-Hodgkin's lymphoma.  No nausea or vomiting.  No chest pain or dyspnea.  She was in atrial fibrillation with RVR during her initial evaluation.  She has converted to normal sinus rhythm at the time of my evaluation.  A CT scan performed in the emergency department demonstrated diverticulitis with a possible small abscess.  No free air was seen.  There was some gallbladder distention, and a right upper quadrant ultrasound is pending.  She denies any epigastric or right upper quadrant pain.  General surgery has been consulted for further evaluation and management of diverticulitis.  Interval Events: She was admitted to the medicine service for ongoing management of diverticulitis in the setting of multiple medical comorbidities.  She states that she is still having lower abdominal pain and that bowel movements, of which she has had 2, are particularly uncomfortable.  No nausea or vomiting.  No fevers or chills.  The hospitalist service did discuss possible percutaneous drainage of the small abscess, however it is too small per interventional radiology for any intervention.  Right upper quadrant ultrasound was negative for gallbladder pathology.  Metronidazole was  added to her antibiotic regimen.  Her white blood cell count is normal as of this morning.   Past Medical History:  Diagnosis Date  . A-fib ([place])    07/2012: A. fib with RVR in the setting of myocardial infarction. Converted to sinus rhythm with amiodarone. No recurrence.  . Anxiety   . Atrophic vaginitis   . CHF (congestive heart failure) ([place])   . Chronic combined systolic (congestive) and diastolic (congestive) heart failure ([place])    a. EF was 25-35% post MI but improved to 45-50% in 04/2013; b. 03/2017 Echo: EF 40-45%, mod focal/basal hypertrophy of septum. Sev mid-apicalanteroseptal, ant, and apical HK. Gr1 DD, mild AI/MR, mod TR, PASP 33mHg.  . Colon adenomas   . Coronary artery disease    a. [id number] STEMI complicated by cardiogenic shock. Cath/PCI: LAD 162m (DESx2), RCA 95p(DES). EF 25%; b. 03/2017 MV: EF 57%, fixed apical, periapical, mid to distal anteroseptal defect. No ischemia.  . Eczema   . Fibrocystic breast disease   . Gastritis   . GERD (gastroesophageal reflux disease)   . Glaucoma   . Headache   . History of gastritis   . Hyperlipidemia   . Hypertension   . Hypothyroidism   . Insomnia   . Ischemic cardiomyopathy    a. 07/2012 EF 25-35% following MI-->Improved to 45-50%;  b. 03/2017 Echo: EF 40-45%.  [name] Lymphoma ([place])   . Myocardial infarction ([place]) 08/18/2014  . Non Hodgkin's lymphoma ([place]) 2005   reoccurance 2007 and 2012  . Osteoarthritis   . Osteoporosis   . Polyneuropathy   . Polyposis of colon   .  Restless leg syndrome   . Urinary, incontinence, stress female     Past Surgical History:  Procedure Laterality Date  . ABDOMINAL HYSTERECTOMY  1956  . APPENDECTOMY    . AXILLARY LYMPH NODE BIOPSY Left 03/18/2015   Procedure: AXILLARY LYMPH NODE BIOPSY;  Surgeon: [name], MD;  Location: [name];  Service: General;  Laterality: Left;  . CARDIAC CATHETERIZATION  08/19/2012   [name]; [place]  . COLONOSCOPY    . COLONOSCOPY WITH PROPOFOL N/A 03/02/2016    Procedure: COLONOSCOPY WITH PROPOFOL;  Surgeon: Manya Silvas, MD;  Location: [facility] ENDOSCOPY;  Service: Endoscopy;  Laterality: N/A;  . COLONOSCOPY WITH PROPOFOL N/A 09/26/2017   Procedure: COLONOSCOPY WITH PROPOFOL;  Surgeon: Manya Silvas, MD;  Location: [facility] ENDOSCOPY;  Service: Endoscopy;  Laterality: N/A;  . CORONARY ANGIOPLASTY WITH STENT PLACEMENT     x3 stents  . CORONARY ANGIOPLASTY WITH STENT PLACEMENT    . CORONARY ARTERY BYPASS GRAFT    . ESOPHAGOGASTRODUODENOSCOPY (EGD) WITH PROPOFOL N/A 03/02/2016   Procedure: ESOPHAGOGASTRODUODENOSCOPY (EGD) WITH PROPOFOL;  Surgeon: Manya Silvas, MD;  Location: [facility] ENDOSCOPY;  Service: Endoscopy;  Laterality: N/A;  . ESOPHAGOGASTRODUODENOSCOPY (EGD) WITH PROPOFOL N/A 04/14/2019   Procedure: ESOPHAGOGASTRODUODENOSCOPY (EGD) WITH PROPOFOL;  Surgeon: Tol[place], Benay Pike, MD;  Location: [name] ENDOSCOPY;  Service: Gastroenterology;  Laterality: N/A;  . LYMPH NODE BIOPSY Right 07-24-11   Dr Bary Castilla  . MOHS SURGERY     bilateral shoulders  . PTCA    . skin cancer removal    . TOTAL ABDOMINAL HYSTERECTOMY W/ BILATERAL SALPINGOOPHORECTOMY      Family History  Problem Relation Age of Onset  . Heart attack Father   . Heart disease Brother   . Leukemia Mother   . Bladder Cancer Neg Hx   . Prostate cancer Neg Hx   . Kidney cancer Neg Hx   . Breast cancer Neg Hx     Social History:  reports that she quit smoking about 71 years ago. Her smoking use included cigarettes. She has never used smokeless tobacco. She reports that she does not drink alcohol or use drugs.  Allergies:  Allergies  Allergen Reactions  . Ace Inhibitors     Other reaction(s): Cough  . Benadryl [Diphenhydramine Hcl]     Other reaction(s): Unknown  . Codeine     Other reaction(s): Hallucination  . Diphenhydramine   . Guaiacol   . Hydrocodone     Other reaction(s): Hallucination  . Cardizem [Diltiazem] Rash  . Guaifenesin & Derivatives Rash    Medications: I have  reviewed the patient's current medications.  Results for orders placed or performed during the hospital encounter of 01/19/20 (from the past 48 hour(s))  CBC with Differential     Status: Abnormal   Collection Time: 01/19/20  1:09 PM  Result Value Ref Range   WBC 15.2 (H) 4.0 - 10.5 K/uL   RBC 3.71 (L) 3.87 - 5.11 MIL/uL   Hemoglobin 12.3 12.0 - 15.0 g/dL   HCT 35.5 (L) 36.0 - 46.0 %   MCV 95.7 80.0 - 100.0 fL   MCH 33.2 26.0 - 34.0 pg   MCHC 34.6 30.0 - 36.0 g/dL   RDW 14.8 11.5 - 15.5 %   Platelets 88 (L) 150 - 400 K/uL    Comment: Immature Platelet Fraction may be clinically indicated, consider ordering this additional test [id number]    nRBC 0.0 0.0 - 0.2 %   Neutrophils Relative % 62 %   Neutro Abs  9.4 (H) 1.7 - 7.7 K/uL   Lymphocytes Relative 33 %   Lymphs Abs 5.0 (H) 0.7 - 4.0 K/uL   Monocytes Relative 4 %   Monocytes Absolute 0.7 0.1 - 1.0 K/uL   Eosinophils Relative 0 %   Eosinophils Absolute 0.0 0.0 - 0.5 K/uL   Basophils Relative 0 %   Basophils Absolute 0.0 0.0 - 0.1 K/uL   Immature Granulocytes 1 %   Abs Immature Granulocytes 0.07 0.00 - 0.07 K/uL    Comment: Performed at [facility], [address] [place], [place] [address]  Comprehensive metabolic panel     Status: Abnormal   Collection Time: 01/19/20  1:09 PM  Result Value Ref Range   Sodium 134 (L) 135 - 145 mmol/L   Potassium 3.9 3.5 - 5.1 mmol/L   Chloride 104 98 - 111 mmol/L   CO2 21 (L) 22 - 32 mmol/L   Glucose, Bld 105 (H) 70 - 99 mg/dL    Comment: Glucose reference range applies only to samples taken after fasting for at least 8 hours.   BUN 20 8 - 23 mg/dL   Creatinine, Ser 0.69 0.44 - 1.00 mg/dL   Calcium 8.2 (L) 8.9 - 10.3 mg/dL   Total Protein 6.3 (L) 6.5 - 8.1 g/dL   Albumin 3.2 (L) 3.5 - 5.0 g/dL   AST 13 (L) 15 - 41 U/L   ALT 10 0 - 44 U/L   Alkaline Phosphatase 42 38 - 126 U/L   Total Bilirubin 1.7 (H) 0.3 - 1.2 mg/dL   GFR calc non Af Amer >60 >60 mL/min   GFR calc Af  Amer >60 >60 mL/min   Anion gap 9 5 - 15    Comment: Performed at [facility], [address] [place], [place] [address]  Lipase, blood     Status: None   Collection Time: 01/19/20  1:09 PM  Result Value Ref Range   Lipase 30 11 - 51 U/L    Comment: Performed at [facility], [address] [place], [place] [address]  Urinalysis, Complete w Microscopic     Status: Abnormal   Collection Time: 01/19/20  1:09 PM  Result Value Ref Range   Color, Urine YELLOW (A) YELLOW   APPearance CLEAR (A) CLEAR   Specific Gravity, Urine 1.013 1.005 - 1.030   pH 7.0 5.0 - 8.0   Glucose, UA NEGATIVE NEGATIVE mg/dL   Hgb urine dipstick NEGATIVE NEGATIVE   Bilirubin Urine NEGATIVE NEGATIVE   Ketones, ur NEGATIVE NEGATIVE mg/dL   Protein, ur 30 (A) NEGATIVE mg/dL   Nitrite NEGATIVE NEGATIVE   Leukocytes,Ua TRACE (A) NEGATIVE   RBC / HPF 0-5 0 - 5 RBC/hpf   WBC, UA 6-10 0 - 5 WBC/hpf   Bacteria, UA NONE SEEN NONE SEEN   Squamous Epithelial / LPF 0-5 0 - 5    Comment: Performed at [facility], [place], [place] [address]  Troponin I (High Sensitivity)     Status: None   Collection Time: 01/19/20  1:09 PM  Result Value Ref Range   Troponin I (High Sensitivity) 8 <18 ng/L    Comment: (NOTE) Elevated high sensitivity troponin I (hsTnI) values and significant  changes across serial measurements may suggest ACS but many other  chronic and acute conditions are known to elevate hsTnI results.  Refer to the "Links" section for chest pain algorithms and additional  guidance. Performed at [facility], [address],  [place], [place] [address]   Magnesium     Status: None   Collection Time: 01/19/20  1:09 PM  Result Value Ref Range   Magnesium 1.9 1.7 - 2.4 mg/dL    Comment: Performed at [facility], [place], [place] [address]  Troponin I (High Sensitivity)     Status: None   Collection Time: 01/19/20  2:48 PM  Result  Value Ref Range   Troponin I (High Sensitivity) 12 <18 ng/L    Comment: (NOTE) Elevated high sensitivity troponin I (hsTnI) values and significant  changes across serial measurements may suggest ACS but many other  chronic and acute conditions are known to elevate hsTnI results.  Refer to the "Links" section for chest pain algorithms and additional  guidance. Performed at [name], [address] [place], [place] [address]   SARS Coronavirus 2 by RT PCR (hospital order, performed in [facility] hospital lab) Nasopharyngeal Nasopharyngeal Swab     Status: None   Collection Time: 01/19/20  3:56 PM   Specimen: Nasopharyngeal Swab  Result Value Ref Range   SARS Coronavirus 2 NEGATIVE NEGATIVE    Comment: (NOTE) SARS-CoV-2 target nucleic acids are NOT DETECTED. The SARS-CoV-2 RNA is generally detectable in upper and lower respiratory specimens during the acute phase of infection. The lowest concentration of SARS-CoV-2 viral copies this assay can detect is 250 copies / mL. A negative result does not preclude SARS-CoV-2 infection and should not be used as the sole basis for treatment or other patient management decisions.  A negative result may occur with improper specimen collection / handling, submission of specimen other than nasopharyngeal swab, presence of viral mutation(s) within the areas targeted by this assay, and inadequate number of viral copies (<250 copies / mL). A negative result must be combined with clinical observations, patient history, and epidemiological information. Fact Sheet for Patients:   StrictlyIdeas.no Fact Sheet for Healthcare Providers: BankingDealers.co.za This test is not yet approved or cleared  by the [place] FDA and has been authorized for detection and/or diagnosis of SARS-CoV-2 by FDA under an Emergency Use Authorization (EUA).  This EUA will remain in effect (meaning this test can be  used) for the duration of the COVID-19 declaration under Section 564(b)(1) of the Act, 21 U.S.C. section 360bbb-3(b)(1), unless the authorization is terminated or revoked sooner. Performed at [facility], [address] [place], [place] [address]   TSH     Status: None   Collection Time: 01/19/20  5:00 PM  Result Value Ref Range   TSH 1.471 0.350 - 4.500 uIU/mL    Comment: Performed by a 3rd Generation assay with a functional sensitivity of <=0.01 uIU/mL. Performed at [facility], [address] [place], [place] [address]   Lactic acid, plasma     Status: None   Collection Time: 01/19/20  5:40 PM  Result Value Ref Range   Lactic Acid, Venous 1.7 0.5 - 1.9 mmol/L    Comment: Performed at [facility], [address] [place], [place] [address]  Lactic acid, plasma     Status: None   Collection Time: 01/19/20  9:44 PM  Result Value Ref Range   Lactic Acid, Venous 1.6 0.5 - 1.9 mmol/L    Comment: Performed at [facility], [address] [place],  [id number]  Basic metabolic panel     Status: Abnormal   Collection Time: 01/20/20  4:59 AM  Result Value Ref Range   Sodium 139  135 - 145 mmol/L   Potassium 3.7 3.5 - 5.1 mmol/L   Chloride 107 98 - 111 mmol/L   CO2 24 22 - 32 mmol/L   Glucose, Bld 86 70 - 99 mg/dL    Comment: Glucose reference range applies only to samples taken after fasting for at least 8 hours.   BUN 18 8 - 23 mg/dL   Creatinine, Ser 0.96 0.44 - 1.00 mg/dL   Calcium 8.4 (L) 8.9 - 10.3 mg/dL   GFR calc non Af Amer 55 (L) >60 mL/min   GFR calc Af Amer >60 >60 mL/min   Anion gap 8 5 - 15    Comment: Performed at [facility], [address] [place], [place] [address]  CBC     Status: Abnormal   Collection Time: 01/20/20  4:59 AM  Result Value Ref Range   WBC 8.9 4.0 - 10.5 K/uL   RBC 3.01 (L) 3.87 - 5.11 MIL/uL   Hemoglobin 10.1 (L) 12.0 - 15.0 g/dL   HCT 28.6 (L) 36.0 - 46.0 %   MCV 95.0 80.0 - 100.0  fL   MCH 33.6 26.0 - 34.0 pg   MCHC 35.3 30.0 - 36.0 g/dL   RDW 14.7 11.5 - 15.5 %   Platelets 129 (L) 150 - 400 K/uL    Comment: Immature Platelet Fraction may be clinically indicated, consider ordering this additional test [id number]    nRBC 0.0 0.0 - 0.2 %    Comment: Performed at [facility], [address], [place], [place] [address]    CT Head Wo Contrast  Result Date: 01/19/2020 CLINICAL DATA:  Status post head trauma.  Abdominal pain. EXAM: CT HEAD WITHOUT CONTRAST CT CERVICAL SPINE WITHOUT CONTRAST TECHNIQUE: Multidetector CT imaging of the head and cervical spine was performed following the standard protocol without intravenous contrast. Multiplanar CT image reconstructions of the cervical spine were also generated. COMPARISON:  None. FINDINGS: CT HEAD FINDINGS Brain: No evidence of acute infarction, hemorrhage, extra-axial collection, ventriculomegaly, or mass effect. Generalized cerebral atrophy. Periventricular white matter low attenuation likely secondary to microangiopathy. Vascular: Cerebrovascular atherosclerotic calcifications are noted. Skull: Negative for fracture or focal lesion. Sinuses/Orbits: Visualized portions of the orbits are unremarkable. Visualized portions of the paranasal sinuses are unremarkable. Visualized portions of the mastoid air cells are unremarkable. Other: None. CT CERVICAL SPINE FINDINGS Alignment: 2 mm anterolisthesis of C7 on T1. Skull base and vertebrae: No acute fracture. No primary bone lesion or focal pathologic process. Soft tissues and spinal canal: No prevertebral fluid or swelling. No visible canal hematoma. Disc levels: Degenerative disease with severe disc height loss at C3-4, C6-7 and to lesser extent C5-6. bilateral facet arthropathy throughout the cervical spine. At C2-3 there is mild left foraminal stenosis. At C3-4 there is bilateral foraminal stenosis. At C4-5 there is severe right and moderate left foraminal stenosis. At C5-6  there is mild bilateral foraminal stenosis. At C6-7 there is bilateral severe foraminal stenosis. Upper chest: Lung apices are clear. Other: No fluid collection or hematoma. 8 mm hypodense thyroid mass in the isthmus of the thyroid unchanged compared with 12/06/2017. not clinically significant; no follow-up imaging recommended (ref: J Am Coll Radiol. 2015 Feb;12(2): 143-50). Bilateral carotid artery atherosclerosis. IMPRESSION: 1. No acute intracranial pathology. 2. No acute osseous injury of the cervical spine. 3. Cervical spine spondylosis as described above. Electronically Signed   By: Kathreen Devoid   On: 01/19/2020 14:54   CT Cervical Spine Wo Contrast  Result Date: 01/19/2020 CLINICAL  DATA:  Status post head trauma.  Abdominal pain. EXAM: CT HEAD WITHOUT CONTRAST CT CERVICAL SPINE WITHOUT CONTRAST TECHNIQUE: Multidetector CT imaging of the head and cervical spine was performed following the standard protocol without intravenous contrast. Multiplanar CT image reconstructions of the cervical spine were also generated. COMPARISON:  None. FINDINGS: CT HEAD FINDINGS Brain: No evidence of acute infarction, hemorrhage, extra-axial collection, ventriculomegaly, or mass effect. Generalized cerebral atrophy. Periventricular white matter low attenuation likely secondary to microangiopathy. Vascular: Cerebrovascular atherosclerotic calcifications are noted. Skull: Negative for fracture or focal lesion. Sinuses/Orbits: Visualized portions of the orbits are unremarkable. Visualized portions of the paranasal sinuses are unremarkable. Visualized portions of the mastoid air cells are unremarkable. Other: None. CT CERVICAL SPINE FINDINGS Alignment: 2 mm anterolisthesis of C7 on T1. Skull base and vertebrae: No acute fracture. No primary bone lesion or focal pathologic process. Soft tissues and spinal canal: No prevertebral fluid or swelling. No visible canal hematoma. Disc levels: Degenerative disease with severe disc height  loss at C3-4, C6-7 and to lesser extent C5-6. bilateral facet arthropathy throughout the cervical spine. At C2-3 there is mild left foraminal stenosis. At C3-4 there is bilateral foraminal stenosis. At C4-5 there is severe right and moderate left foraminal stenosis. At C5-6 there is mild bilateral foraminal stenosis. At C6-7 there is bilateral severe foraminal stenosis. Upper chest: Lung apices are clear. Other: No fluid collection or hematoma. 8 mm hypodense thyroid mass in the isthmus of the thyroid unchanged compared with 12/06/2017. not clinically significant; no follow-up imaging recommended (ref: J Am Coll Radiol. 2015 Feb;12(2): 143-50). Bilateral carotid artery atherosclerosis. IMPRESSION: 1. No acute intracranial pathology. 2. No acute osseous injury of the cervical spine. 3. Cervical spine spondylosis as described above. Electronically Signed   By: Kathreen Devoid   On: 01/19/2020 14:54   CT Abdomen Pelvis W Contrast  Result Date: 01/19/2020 CLINICAL DATA:  Abdominal pain. EXAM: CT ABDOMEN AND PELVIS WITH CONTRAST TECHNIQUE: Multidetector CT imaging of the abdomen and pelvis was performed using the standard protocol following bolus administration of intravenous contrast. CONTRAST:  52mL OMNIPAQUE IOHEXOL 300 MG/ML  SOLN COMPARISON:  PET-CT 12/06/2017 FINDINGS: Lower chest: Lung bases are clear. Hepatobiliary: Low-density lesion in the medial hepatic lobe is not simple fluid attenuation however lesion was larger on comparison PET-CT scan and non metabolic. Gallbladder is moderately distended at 3.4 cm. There is mild thickening gallbladder wall. Small pericholecystic fluid noted on coronal image 26/5. No radiodense gallstones are evident. Common bile duct normal caliber. Pancreas: Large periampullary duodenum diverticulum measuring 2.4 cm. No pancreatic duct dilatation or inflammation. Spleen: Normal spleen Adrenals/urinary tract: Adrenal glands normal. No renal obstruction. Benign-appearing renal cysts  noted. Stomach/Bowel: Stomach, duodenum and small-bowel appear normal. Cecum normal. Appendix not identified. There is thickening through the sigmoid region the colon. The bowel contour poorly defined (image 63/2. This small fluid collection adjacent to the RIGHT operator space measuring 1.5 cm. There is mucosal enhancement through the bowel seen on coronal image 48/5. Vascular/Lymphatic: Abdominal aorta is normal caliber with atherosclerotic calcification. There is no retroperitoneal or periportal lymphadenopathy. No pelvic lymphadenopathy. Several enlarged LEFT periaortic lymph nodes noted. For example 16 mm node image 32/2. Lymph node between the IVC in the RIGHT psoas muscle measures 1.3 cm. No pelvic adenopathy. These lymph nodes are similar comparison PET-CT scan with mild metabolic activity. Reproductive: Post hysterectomy.  No adnexal abnormality. Other: No free fluid. Musculoskeletal: No aggressive osseous lesion. IMPRESSION: 1. Mild gallbladder distension with gallbladder wall thickening. No gallstones  identified by CT. Recommend clinical correlation for acute cholecystitis. 2. No evidence of pancreatitis. Periampullary duodenum diverticulum noted. 3. Inflammation and poor bowel wall distinction of the sigmoid colon. Concern for diverticulitis of the sigmoid colon. Recommend follow-up exam to exclude abscess formation. 4. Retroperitoneal adenopathy similar comparison PET-CT scan. Findings consistent low-grade lymphoma. Electronically Signed   By: Suzy Bouchard M.D.   On: 01/19/2020 15:10   US Abdomen Limited RUQ  Result Date: 01/20/2020 CLINICAL DATA:  Abdominal pain. EXAM: ULTRASOUND ABDOMEN LIMITED RIGHT UPPER QUADRANT COMPARISON:  CT scan of the abdomen dated 01/19/2020, CT scan of the abdomen dated 02/23/2012 and PET CTs dated 02/04/2014 and 12/06/2017 FINDINGS: Gallbladder: Multiple small stones. No thickening of the gallbladder wall. Negative sonographic Murphy's sign. Tiny amount  pericholecystic fluid. Common bile duct: Diameter: 5.4 mm,. Liver: There is a solid slightly hypoechoic 1.7 x 1.4 x 1 3 cm mass in the left lobe of the liver adjacent to the falciform ligament which correlates with the mass seen on the prior CT scan. There is also a 7 mm simple cyst in the left lobe of the liver. Liver parenchyma is otherwise normal. Portal vein is patent on color Doppler imaging with normal direction of blood flow towards the liver. Other: None. IMPRESSION: 1. Cholelithiasis. 2. Tiny amount of pericholecystic fluid, nonspecific. 3. Hypoechoic mass in the left lobe of the liver. This had the appearance of a simple cyst on 02/23/2012 and appears smaller than on that exam. It is smaller than on the prior PET-CT of 12/06/2017 and showed no hypermetabolic activity on that scan. It probably represents a complex but benign hepatic cyst. Electronically Signed   By: Lorriane Shire M.D.   On: 01/20/2020 08:31    Review of Systems  Constitutional: Positive for appetite change, chills and fever.  Cardiovascular: Positive for palpitations.  Gastrointestinal: Positive for abdominal pain and constipation.  Neurological: Positive for syncope.  All other systems reviewed and are negative. Or as discussed in the history of present illness.  Blood pressure 122/74, pulse 72, temperature 98 F (36.7 C), temperature source Oral, resp. rate 16, height 5\' 1"  (1.549 m), weight 46.3 kg, SpO2 99 %.  Body mass index is 19.27 kg/m.   Physical Exam  Constitutional: She is oriented to person, place, and time. She appears well-developed and well-nourished. No distress.  HENT:  Head: Normocephalic and atraumatic.  Eyes: Right eye exhibits no discharge. Left eye exhibits no discharge. No scleral icterus.  Neck: No tracheal deviation present.  Cardiovascular: Normal rate and regular rhythm.  Respiratory: No stridor. No respiratory distress.  GI: Soft. There is abdominal tenderness.  Tender in bilateral  lower quadrants, left more so than right.  No peritoneal signs.  Murphy sign is notably negative  Genitourinary:    Genitourinary Comments: Deferred   Musculoskeletal:        General: No deformity.  Neurological: She is alert and oriented to person, place, and time.  Skin: Skin is warm and dry.  Psychiatric: She has a normal mood and affect. Her behavior is normal.    Assessment/Plan: This is an 85 year old woman with lower abdominal pain and syncope.  CT scan was consistent with diverticulitis with a small abscess.  The abscess is not amenable to percutaneous drainage.  Her white blood cell count has normalized, but she continues to endorse lower abdominal pain.  -- She should remain n.p.o. with IV fluids.  A few ice chips for comfort would be okay --Continue IV antibiotics.  I am  not sure what metronidazole is adding as Zosyn should be adequate coverage.  Consider discontinuing metronidazole. --General surgery will continue to follow.   [name] 01/20/2020, 12:24 PM

## 2020-01-20 NOTE — Progress Notes (Signed)
*  PRELIMINARY RESULTS* Echocardiogram 2D Echocardiogram has been performed.  Jeanne Pittman 01/20/2020, 11:07 AM

## 2020-01-20 NOTE — Progress Notes (Signed)
PROGRESS NOTE    Jeanne Pittman  [id number] DOB: 1934/10/15 DOA: 01/19/2020 PCP: [name], MD   Chief Complaint  Patient presents with  . Loss of Consciousness  . Abdominal Pain    Brief Narrative:  85 year old female with chronic combined systolic and diastolic CHF, history of STEMI with DES to LAD and RCA, hypertension, hypothyroidism, history of nonoxygen, s/p chemotherapy (in remission) presented with episode of syncope, lower abdominal cramping with episode of rapid A. fib in the ED, leukocytosis sigmoid diverticulitis and right colon abscess. Placed on empiric IV antibiotic.  Surgery involved in care.  Assessment & Plan:   Principal Problem: SIRS secondary to sigmoid diverticulitis/right colon abscess No signs of sepsis.  Keep n.p.o.  Empiric IV Zosyn.  Will discontinue Flagyl.  IR consulted for drainage of right colonic fluid but suggested that  abscess was too small and not amenable to percutaneous drainage. Surgery following.  Recommend to keep n.p.o. with gentle hydration, IV antibiotic and ice chips. Serial abdominal exam.  Replenished low K. Still having lower abdominal cramping not improved with fentanyl.  I have switched her to IV morphine 2 mg every 4 hours as needed (reports no allergy to morphine).  Plan on repeat CT scan in 48 hours.  Active Problems: Atrial fibrillation with RVR (HCC) Prior history of A. fib in the setting of NSTEMI. Resolved on short dose of amiodarone. CHADS2vasc of 33 (age, female, CHF, history of MI, hypertension).  A. fib possibly triggered by infection.  Will hold off on anticoagulation as patient has thrombocytopenia. 2D echo pending.  Patient did not tolerate IV Cardizem in the ED (had itching).  Rate currently controlled on scheduled IV Lopressor.  Syncope Likely secondary to pain/dehydration.  A. fib on presentation.  EKG and initial troponin negative.  No chest pain symptoms or seizure activity.  Monitor on  telemetry.  PVCs and NSVT Normal magnesium.  Continue potassium with IV fluids.  ?  Gallbladder distention with thickening Negative for gallstones on CT.  Does not have any right lower quadrant tenderness or impaired LFTs.  Right upper ultrasound shows cholelithiasis without cholecystitis.  Hypoechoic mass in the left lobe suggest benign hepatic cyst.    Chronic systolic heart failure ([place]) Patient hypovolemic with poor p.o. intake.    Continue gentle hydration. Strict I's/O and daily weight.  Holding home meds (Lasix,  ACE inhibitor and statin) since patient NPO..  Follows with Dr. Fletcher Anon.    Coronary artery disease with history of MI s/p DES to LAD and RCA. Hold aspirin, beta-blocker and statin for now as patient is NPO.  History of non-Hodgkin's lymphoma Completed chemotherapy and in remission.  Thrombocytopenia Chronic.    Stable at baseline.   DVT prophylaxis: Subcu Lovenox Code Status: Full code Family Communication: Son at bedside Disposition:   Status is: Inpatient  Remains inpatient appropriate because:IV treatments appropriate due to intensity of illness or inability to take PO   Dispo: The patient is from: Home              Anticipated d/c is to: Home              Anticipated d/c date is: 3 days              Patient currently is not medically stable to d/c.        Consultants:   Surgery/IR   Procedures: CT abdomen pelvis, 2D echo  Antimicrobials: IV Zosyn     Subjective: Seen and examined.  Still complaining of off-and-on lower abdominal cramps.  No nausea or vomiting.  Reports having a small bowel movement last night.  Objective: Vitals:   01/19/20 2000 01/19/20 2030 01/20/20 0518 01/20/20 0854  BP: (!) 117/55 (!) 122/56 (!) 116/52 122/74  Pulse: 78 73 77 72  Resp: 20 15 16 16   Temp:   98.1 F (36.7 C) 98 F (36.7 C)  TempSrc:   Oral Oral  SpO2: 98% 98% 97% 99%  Weight:      Height:        Intake/Output Summary (Last 24 hours) at  01/20/2020 1624 Last data filed at 01/20/2020 0300 Gross per 24 hour  Intake 1049.49 ml  Output --  Net 1049.49 ml   Filed Weights   01/19/20 1249  Weight: 46.3 kg    Examination:  General: Elderly female, fatigued, not in distress HEENT: Moist supple neck Chest: Clear bilaterally CVs: Normal S1-S2, no murmurs GI: Soft, nondistended, bowel sounds present, bilateral lower quadrant tenderness Musculoskeletal: Warm, no edema   Data Reviewed: I have personally reviewed following labs and imaging studies  CBC: Recent Labs  Lab 01/19/20 1309 01/20/20 0459  WBC 15.2* 8.9  NEUTROABS 9.4*  --   HGB 12.3 10.1*  HCT 35.5* 28.6*  MCV 95.7 95.0  PLT 88* 129*    Basic Metabolic Panel: Recent Labs  Lab 01/19/20 1309 01/20/20 0459 01/20/20 0514  NA 134* 139  --   K 3.9 3.7  --   CL 104 107  --   CO2 21* 24  --   GLUCOSE 105* 86  --   BUN 20 18  --   CREATININE 0.69 0.96  --   CALCIUM 8.2* 8.4*  --   MG 1.9  --  2.1    GFR: Estimated Creatinine Clearance: 33 mL/min (by C-G formula based on SCr of 0.96 mg/dL).  Liver Function Tests: Recent Labs  Lab 01/19/20 1309  AST 13*  ALT 10  ALKPHOS 42  BILITOT 1.7*  PROT 6.3*  ALBUMIN 3.2*    CBG: No results for input(s): GLUCAP in the last 168 hours.   Recent Results (from the past 240 hour(s))  SARS Coronavirus 2 by RT PCR (hospital order, performed in [facility] hospital lab) Nasopharyngeal Nasopharyngeal Swab     Status: None   Collection Time: 01/19/20  3:56 PM   Specimen: Nasopharyngeal Swab  Result Value Ref Range Status   SARS Coronavirus 2 NEGATIVE NEGATIVE Final    Comment: (NOTE) SARS-CoV-2 target nucleic acids are NOT DETECTED. The SARS-CoV-2 RNA is generally detectable in upper and lower respiratory specimens during the acute phase of infection. The lowest concentration of SARS-CoV-2 viral copies this assay can detect is 250 copies / mL. A negative result does not preclude SARS-CoV-2 infection and  should not be used as the sole basis for treatment or other patient management decisions.  A negative result may occur with improper specimen collection / handling, submission of specimen other than nasopharyngeal swab, presence of viral mutation(s) within the areas targeted by this assay, and inadequate number of viral copies (<250 copies / mL). A negative result must be combined with clinical observations, patient history, and epidemiological information. Fact Sheet for Patients:   StrictlyIdeas.no Fact Sheet for Healthcare Providers: BankingDealers.co.za This test is not yet approved or cleared  by the [place] FDA and has been authorized for detection and/or diagnosis of SARS-CoV-2 by FDA under an Emergency Use Authorization (EUA).  This EUA will remain in effect (meaning  this test can be used) for the duration of the COVID-19 declaration under Section 564(b)(1) of the Act, 21 U.S.C. section 360bbb-3(b)(1), unless the authorization is terminated or revoked sooner. Performed at [facility], [address] [place], [place] [address]          Radiology Studies: CT Head Wo Contrast  Result Date: 01/19/2020 CLINICAL DATA:  Status post head trauma.  Abdominal pain. EXAM: CT HEAD WITHOUT CONTRAST CT CERVICAL SPINE WITHOUT CONTRAST TECHNIQUE: Multidetector CT imaging of the head and cervical spine was performed following the standard protocol without intravenous contrast. Multiplanar CT image reconstructions of the cervical spine were also generated. COMPARISON:  None. FINDINGS: CT HEAD FINDINGS Brain: No evidence of acute infarction, hemorrhage, extra-axial collection, ventriculomegaly, or mass effect. Generalized cerebral atrophy. Periventricular white matter low attenuation likely secondary to microangiopathy. Vascular: Cerebrovascular atherosclerotic calcifications are noted. Skull: Negative for fracture or focal lesion.  Sinuses/Orbits: Visualized portions of the orbits are unremarkable. Visualized portions of the paranasal sinuses are unremarkable. Visualized portions of the mastoid air cells are unremarkable. Other: None. CT CERVICAL SPINE FINDINGS Alignment: 2 mm anterolisthesis of C7 on T1. Skull base and vertebrae: No acute fracture. No primary bone lesion or focal pathologic process. Soft tissues and spinal canal: No prevertebral fluid or swelling. No visible canal hematoma. Disc levels: Degenerative disease with severe disc height loss at C3-4, C6-7 and to lesser extent C5-6. bilateral facet arthropathy throughout the cervical spine. At C2-3 there is mild left foraminal stenosis. At C3-4 there is bilateral foraminal stenosis. At C4-5 there is severe right and moderate left foraminal stenosis. At C5-6 there is mild bilateral foraminal stenosis. At C6-7 there is bilateral severe foraminal stenosis. Upper chest: Lung apices are clear. Other: No fluid collection or hematoma. 8 mm hypodense thyroid mass in the isthmus of the thyroid unchanged compared with 12/06/2017. not clinically significant; no follow-up imaging recommended (ref: J Am Coll Radiol. 2015 Feb;12(2): 143-50). Bilateral carotid artery atherosclerosis. IMPRESSION: 1. No acute intracranial pathology. 2. No acute osseous injury of the cervical spine. 3. Cervical spine spondylosis as described above. Electronically Signed   By: Kathreen Devoid   On: 01/19/2020 14:54   CT Cervical Spine Wo Contrast  Result Date: 01/19/2020 CLINICAL DATA:  Status post head trauma.  Abdominal pain. EXAM: CT HEAD WITHOUT CONTRAST CT CERVICAL SPINE WITHOUT CONTRAST TECHNIQUE: Multidetector CT imaging of the head and cervical spine was performed following the standard protocol without intravenous contrast. Multiplanar CT image reconstructions of the cervical spine were also generated. COMPARISON:  None. FINDINGS: CT HEAD FINDINGS Brain: No evidence of acute infarction, hemorrhage,  extra-axial collection, ventriculomegaly, or mass effect. Generalized cerebral atrophy. Periventricular white matter low attenuation likely secondary to microangiopathy. Vascular: Cerebrovascular atherosclerotic calcifications are noted. Skull: Negative for fracture or focal lesion. Sinuses/Orbits: Visualized portions of the orbits are unremarkable. Visualized portions of the paranasal sinuses are unremarkable. Visualized portions of the mastoid air cells are unremarkable. Other: None. CT CERVICAL SPINE FINDINGS Alignment: 2 mm anterolisthesis of C7 on T1. Skull base and vertebrae: No acute fracture. No primary bone lesion or focal pathologic process. Soft tissues and spinal canal: No prevertebral fluid or swelling. No visible canal hematoma. Disc levels: Degenerative disease with severe disc height loss at C3-4, C6-7 and to lesser extent C5-6. bilateral facet arthropathy throughout the cervical spine. At C2-3 there is mild left foraminal stenosis. At C3-4 there is bilateral foraminal stenosis. At C4-5 there is severe right and moderate left foraminal stenosis. At C5-6 there  is mild bilateral foraminal stenosis. At C6-7 there is bilateral severe foraminal stenosis. Upper chest: Lung apices are clear. Other: No fluid collection or hematoma. 8 mm hypodense thyroid mass in the isthmus of the thyroid unchanged compared with 12/06/2017. not clinically significant; no follow-up imaging recommended (ref: J Am Coll Radiol. 2015 Feb;12(2): 143-50). Bilateral carotid artery atherosclerosis. IMPRESSION: 1. No acute intracranial pathology. 2. No acute osseous injury of the cervical spine. 3. Cervical spine spondylosis as described above. Electronically Signed   By: Kathreen Devoid   On: 01/19/2020 14:54   CT Abdomen Pelvis W Contrast  Result Date: 01/19/2020 CLINICAL DATA:  Abdominal pain. EXAM: CT ABDOMEN AND PELVIS WITH CONTRAST TECHNIQUE: Multidetector CT imaging of the abdomen and pelvis was performed using the standard  protocol following bolus administration of intravenous contrast. CONTRAST:  47mL OMNIPAQUE IOHEXOL 300 MG/ML  SOLN COMPARISON:  PET-CT 12/06/2017 FINDINGS: Lower chest: Lung bases are clear. Hepatobiliary: Low-density lesion in the medial hepatic lobe is not simple fluid attenuation however lesion was larger on comparison PET-CT scan and non metabolic. Gallbladder is moderately distended at 3.4 cm. There is mild thickening gallbladder wall. Small pericholecystic fluid noted on coronal image 26/5. No radiodense gallstones are evident. Common bile duct normal caliber. Pancreas: Large periampullary duodenum diverticulum measuring 2.4 cm. No pancreatic duct dilatation or inflammation. Spleen: Normal spleen Adrenals/urinary tract: Adrenal glands normal. No renal obstruction. Benign-appearing renal cysts noted. Stomach/Bowel: Stomach, duodenum and small-bowel appear normal. Cecum normal. Appendix not identified. There is thickening through the sigmoid region the colon. The bowel contour poorly defined (image 63/2. This small fluid collection adjacent to the RIGHT operator space measuring 1.5 cm. There is mucosal enhancement through the bowel seen on coronal image 48/5. Vascular/Lymphatic: Abdominal aorta is normal caliber with atherosclerotic calcification. There is no retroperitoneal or periportal lymphadenopathy. No pelvic lymphadenopathy. Several enlarged LEFT periaortic lymph nodes noted. For example 16 mm node image 32/2. Lymph node between the IVC in the RIGHT psoas muscle measures 1.3 cm. No pelvic adenopathy. These lymph nodes are similar comparison PET-CT scan with mild metabolic activity. Reproductive: Post hysterectomy.  No adnexal abnormality. Other: No free fluid. Musculoskeletal: No aggressive osseous lesion. IMPRESSION: 1. Mild gallbladder distension with gallbladder wall thickening. No gallstones identified by CT. Recommend clinical correlation for acute cholecystitis. 2. No evidence of pancreatitis.  Periampullary duodenum diverticulum noted. 3. Inflammation and poor bowel wall distinction of the sigmoid colon. Concern for diverticulitis of the sigmoid colon. Recommend follow-up exam to exclude abscess formation. 4. Retroperitoneal adenopathy similar comparison PET-CT scan. Findings consistent low-grade lymphoma. Electronically Signed   By: Suzy Bouchard M.D.   On: 01/19/2020 15:10   US Abdomen Limited RUQ  Result Date: 01/20/2020 CLINICAL DATA:  Abdominal pain. EXAM: ULTRASOUND ABDOMEN LIMITED RIGHT UPPER QUADRANT COMPARISON:  CT scan of the abdomen dated 01/19/2020, CT scan of the abdomen dated 02/23/2012 and PET CTs dated 02/04/2014 and 12/06/2017 FINDINGS: Gallbladder: Multiple small stones. No thickening of the gallbladder wall. Negative sonographic Murphy's sign. Tiny amount pericholecystic fluid. Common bile duct: Diameter: 5.4 mm,. Liver: There is a solid slightly hypoechoic 1.7 x 1.4 x 1 3 cm mass in the left lobe of the liver adjacent to the falciform ligament which correlates with the mass seen on the prior CT scan. There is also a 7 mm simple cyst in the left lobe of the liver. Liver parenchyma is otherwise normal. Portal vein is patent on color Doppler imaging with normal direction of blood flow towards the liver. Other: None.  IMPRESSION: 1. Cholelithiasis. 2. Tiny amount of pericholecystic fluid, nonspecific. 3. Hypoechoic mass in the left lobe of the liver. This had the appearance of a simple cyst on 02/23/2012 and appears smaller than on that exam. It is smaller than on the prior PET-CT of 12/06/2017 and showed no hypermetabolic activity on that scan. It probably represents a complex but benign hepatic cyst. Electronically Signed   By: Lorriane Shire M.D.   On: 01/20/2020 08:31        Scheduled Meds: . metoprolol tartrate  5 mg Intravenous Q6H  . pantoprazole (PROTONIX) IV  40 mg Intravenous Q24H   Continuous Infusions: . 0.9 % NaCl with KCl 20 mEq / L 50 mL/hr at 01/20/20 1455    . piperacillin-tazobactam (ZOSYN)  IV 3.375 g (01/20/20 1606)     LOS: 1 day    Time spent: 106 minutes    Pascuala Klutts, MD [facility]   To contact the attending provider between 7A-7P or the covering provider during after hours 7P-7A, please log into the web site [email or web address] and access using universal [place] password for that web site. If you do not have the password, please call the hospital operator.  01/20/2020, 4:24 PM

## 2020-01-20 NOTE — Progress Notes (Signed)
  Request seen for drain placement for diverticular abscess.  Images reviewed by Dr. Rodrigo Ran.  Collection currently too small for drain placement.  Recommend IV abx and repeat CT in a few days.  Marianne Golightly S Rana Hochstein PA-C 01/20/2020 8:23 AM

## 2020-01-21 LAB — CBC
HCT: 29.3 % — ABNORMAL LOW (ref 36.0–46.0)
Hemoglobin: 10.1 g/dL — ABNORMAL LOW (ref 12.0–15.0)
MCH: 32.6 pg (ref 26.0–34.0)
MCHC: 34.5 g/dL (ref 30.0–36.0)
MCV: 94.5 fL (ref 80.0–100.0)
Platelets: 127 10*3/uL — ABNORMAL LOW (ref 150–400)
RBC: 3.1 MIL/uL — ABNORMAL LOW (ref 3.87–5.11)
RDW: 14.4 % (ref 11.5–15.5)
WBC: 7.4 10*3/uL (ref 4.0–10.5)
nRBC: 0 % (ref 0.0–0.2)

## 2020-01-21 LAB — BASIC METABOLIC PANEL
Anion gap: 12 (ref 5–15)
BUN: 16 mg/dL (ref 8–23)
CO2: 20 mmol/L — ABNORMAL LOW (ref 22–32)
Calcium: 8.4 mg/dL — ABNORMAL LOW (ref 8.9–10.3)
Chloride: 108 mmol/L (ref 98–111)
Creatinine, Ser: 0.99 mg/dL (ref 0.44–1.00)
GFR calc Af Amer: >60 mL/min (ref >60)
GFR calc non Af Amer: 53 mL/min — ABNORMAL LOW (ref >60)
Glucose, Bld: 56 mg/dL — ABNORMAL LOW (ref 70–99)
Potassium: 3.7 mmol/L (ref 3.5–5.1)
Sodium: 140 mmol/L (ref 135–145)

## 2020-01-21 MED ORDER — KETOROLAC TROMETHAMINE 15 MG/ML IJ SOLN
15.0000 mg | Freq: Four times a day (QID) | INTRAMUSCULAR | Status: DC | PRN
  Administered 2020-01-21 – 2020-01-23 (×5): 15 mg via INTRAVENOUS
  Filled 2020-01-21 (×7): qty 1

## 2020-01-21 MED ORDER — LORATADINE 10 MG PO TABS
10.0000 mg | ORAL_TABLET | Freq: Every day | ORAL | Status: DC
  Administered 2020-01-21 – 2020-01-24 (×4): 10 mg via ORAL
  Filled 2020-01-21 (×4): qty 1

## 2020-01-21 MED ORDER — SALINE SPRAY 0.65 % NA SOLN: 1.0000 | NASAL | Status: DC | PRN

## 2020-01-21 NOTE — Progress Notes (Signed)
PROGRESS NOTE    Jeanne Pittman  [id number] DOB: 01/20/1935 DOA: 01/19/2020 PCP: [name], MD   Brief Narrative:  85 year old female with chronic combined systolic and diastolic CHF, history of STEMI with DES to LAD and RCA, hypertension, hypothyroidism, history of nonoxygen, s/p chemotherapy (in remission) presented with episode of syncope, lower abdominal cramping with episode of rapid A. fib in the ED, leukocytosis sigmoid diverticulitis and right colon abscess. Placed on empiric IV antibiotic.  Surgery involved in care.  Subjective: Patient was feeling little better when seen today.  Denies any pain after taking Toradol this morning.  Denies any nausea or vomiting.  Son was in the room.  Assessment & Plan:   Principal Problem:   Sigmoid diverticulitis Active Problems:   Coronary artery disease   Chronic systolic heart failure (HCC)   Atrial fibrillation with RVR (HCC)  SIRS secondary to sigmoid diverticulitis/right colon abscess No signs of sepsis.IR consulted for drainage of right colonic fluid but suggested that abscess was too small and not amenable to percutaneous drainage. -Surgery is following-recommending 1 more day of n.p.o.-planning to start her on clear liquids tomorrow. -Continue Zosyn. -Continue IV fluid. -Continue pain management -Repeat CT abdomen tomorrow.  Atrial fibrillation with RVR (HCC) Prior history of A. fib in the setting ofNSTEMI. Resolved on short dose of amiodarone. [id number] (age, female, CHF, history of MI, hypertension). A. fib possibly triggered by infection. Will hold off on anticoagulation as patient has thrombocytopenia. -Initially treated with diltiazem currently rate controlled with IV Lopressor.  Syncope Likely secondary to pain/dehydration. A. fib on presentation. EKG and initial troponin negative. No chest pain symptoms or seizure activity. Monitor on telemetry.  PVCs and NSVT Normal magnesium.  Continue  potassium with IV fluids.  ? Gallbladder distention with thickening Negative for gallstones on CT. Does not have any right lower quadrant tenderness or impaired LFTs.  Right upper ultrasound shows cholelithiasis without cholecystitis.  Hypoechoic mass in the left lobe suggest benign hepatic cyst.  Chronic systolic heart failure ([place]). Patient hypovolemic with poor p.o. intake.   Repeat echocardiogram with EF of 35 to 40% with grade 1 diastolic dysfunction and mild pulmonary hypertension. - Continue gentle hydration. -Strict I's/O and daily weight.  -Holding home meds (Lasix,  ACE inhibitor and statin) since patient NPO.. Follows with Dr. Fletcher Anon.  Coronary artery disease with history of MI s/p DES to LAD and RCA. Hold aspirin, beta-blocker and statin for now as patient is NPO.  History of non-Hodgkin's lymphoma Completed chemotherapy and in remission.  Thrombocytopenia Chronic.   Stable at baseline.  Objective: Vitals:   01/20/20 1621 01/20/20 2331 01/21/20 0840 01/21/20 1704  BP: (!) 120/49 (!) 108/55 135/60 135/66  Pulse: 80 77 77 78  Resp: 16 16    Temp: 98.7 F (37.1 C) 98.7 F (37.1 C) 97.8 F (36.6 C) 97.8 F (36.6 C)  TempSrc: Oral Oral  Oral  SpO2: 100% 95% 99% 100%  Weight:      Height:        Intake/Output Summary (Last 24 hours) at 01/21/2020 1734 Last data filed at 01/20/2020 2300 Gross per 24 hour  Intake 0 ml  Output --  Net 0 ml   Filed Weights   01/19/20 1249  Weight: 46.3 kg    Examination:  General exam: Frail elderly lady, appears calm and comfortable  Respiratory system: Clear to auscultation. Respiratory effort normal. Cardiovascular system: S1 & S2 heard, RRR. No JVD, murmurs, rubs,  Gastrointestinal system: Soft, nontender,  nondistended, bowel sounds positive. Central nervous system: Alert and oriented. No focal neurological deficits. Extremities: No edema, no cyanosis, pulses intact and symmetrical. Psychiatry: Judgement and  insight appear normal. Mood & affect appropriate.    DVT prophylaxis: Lovenox Code Status: Full Family Communication: Son was updated at bedside Disposition Plan:  Status is: Inpatient  Remains inpatient appropriate because:Inpatient level of care appropriate due to severity of illness   Dispo: The patient is from: Home              Anticipated d/c is to: Home              Anticipated d/c date is: 2 days              Patient currently is not medically stable to d/c.   Consultants:   Surgery  Procedures:  Antimicrobials:  Zosyn  Data Reviewed: I have personally reviewed following labs and imaging studies  CBC: Recent Labs  Lab 01/19/20 1309 01/20/20 0459 01/21/20 0449  WBC 15.2* 8.9 7.4  NEUTROABS 9.4*  --   --   HGB 12.3 10.1* 10.1*  HCT 35.5* 28.6* 29.3*  MCV 95.7 95.0 94.5  PLT 88* 129* [id number]*   Basic Metabolic Panel: Recent Labs  Lab 01/19/20 1309 01/20/20 0459 01/20/20 0514 01/21/20 0449  NA 134* 139  --  140  K 3.9 3.7  --  3.7  CL 104 107  --  108  CO2 21* 24  --  20*  GLUCOSE 105* 86  --  56*  BUN 20 18  --  16  CREATININE 0.69 0.96  --  0.99  CALCIUM 8.2* 8.4*  --  8.4*  MG 1.9  --  2.1  --    GFR: Estimated Creatinine Clearance: 32 mL/min (by C-G formula based on SCr of 0.99 mg/dL). Liver Function Tests: Recent Labs  Lab 01/19/20 1309  AST 13*  ALT 10  ALKPHOS 42  BILITOT 1.7*  PROT 6.3*  ALBUMIN 3.2*   Recent Labs  Lab 01/19/20 1309  LIPASE 30   No results for input(s): AMMONIA in the last 168 hours. Coagulation Profile: No results for input(s): INR, PROTIME in the last 168 hours. Cardiac Enzymes: No results for input(s): CKTOTAL, CKMB, CKMBINDEX, TROPONINI in the last 168 hours. BNP (last 3 results) No results for input(s): PROBNP in the last 8760 hours. HbA1C: No results for input(s): HGBA1C in the last 72 hours. CBG: No results for input(s): GLUCAP in the last 168 hours. Lipid Profile: No results for input(s): CHOL,  HDL, LDLCALC, TRIG, CHOLHDL, LDLDIRECT in the last 72 hours. Thyroid Function Tests: Recent Labs    01/19/20 1700  TSH 1.471   Anemia Panel: No results for input(s): VITAMINB12, FOLATE, FERRITIN, TIBC, IRON, RETICCTPCT in the last 72 hours. Sepsis Labs: Recent Labs  Lab 01/19/20 1740 01/19/20 2144  LATICACIDVEN 1.7 1.6    Recent Results (from the past 240 hour(s))  SARS Coronavirus 2 by RT PCR (hospital order, performed in [facility] hospital lab) Nasopharyngeal Nasopharyngeal Swab     Status: None   Collection Time: 01/19/20  3:56 PM   Specimen: Nasopharyngeal Swab  Result Value Ref Range Status   SARS Coronavirus 2 NEGATIVE NEGATIVE Final    Comment: (NOTE) SARS-CoV-2 target nucleic acids are NOT DETECTED. The SARS-CoV-2 RNA is generally detectable in upper and lower respiratory specimens during the acute phase of infection. The lowest concentration of SARS-CoV-2 viral copies this assay can detect is 250 copies / mL. A  negative result does not preclude SARS-CoV-2 infection and should not be used as the sole basis for treatment or other patient management decisions.  A negative result may occur with improper specimen collection / handling, submission of specimen other than nasopharyngeal swab, presence of viral mutation(s) within the areas targeted by this assay, and inadequate number of viral copies (<250 copies / mL). A negative result must be combined with clinical observations, patient history, and epidemiological information. Fact Sheet for Patients:   StrictlyIdeas.no Fact Sheet for Healthcare Providers: BankingDealers.co.za This test is not yet approved or cleared  by the [place] FDA and has been authorized for detection and/or diagnosis of SARS-CoV-2 by FDA under an Emergency Use Authorization (EUA).  This EUA will remain in effect (meaning this test can be used) for the duration of the COVID-19 declaration  under Section 564(b)(1) of the Act, 21 U.S.C. section 360bbb-3(b)(1), unless the authorization is terminated or revoked sooner. Performed at [facility], [address], [place], [place] [address]      Radiology Studies: ECHOCARDIOGRAM COMPLETE  Result Date: 01/20/2020    ECHOCARDIOGRAM REPORT   Patient Name:   DMIYA ANGLEY Date of Exam: 01/20/2020 Medical Rec #:  [id number]       Height:       61.0 in Accession #:    [id number]      Weight:       102.0 lb Date of Birth:  Aug 11, 1935        BSA:          1.419 m Patient Age:    89 years        BP:           122/74 mmHg Patient Gender: F               HR:           72 bpm. Exam Location:  [name] Procedure: 2D Echo, Color Doppler and Cardiac Doppler Indications:     Atrial fibrillation 427.31  History:         Patient has prior history of Echocardiogram examinations, most                  recent 08/20/2019. CHF and Cardiomyopathy, Previous Myocardial                  Infarction, Arrythmias:Atrial Fibrillation; Risk                  Factors:Hypertension.  Sonographer:     Sherrie Sport RDCS (AE) Referring Phys:  [id number] Louellen Molder Diagnosing Phys: Nelva Bush MD IMPRESSIONS  1. Left ventricular ejection fraction, by estimation, is 35 to 40%. The left ventricle has moderately decreased function. The left ventricle demonstrates regional wall motion abnormalities (see scoring diagram/findings for description). There is moderate left ventricular hypertrophy. Left ventricular diastolic parameters are consistent with Grade I diastolic dysfunction (impaired relaxation). There is severe hypokinesis of the left ventricular, mid-apical anteroseptal wall and apical segment.  2. There is at least mild pulmonary hypertension (PASP 35 mmHg plus central venous pressure). Right ventricular systolic function is normal. The right ventricular size is normal.  3. The mitral valve is grossly normal. Mild mitral valve regurgitation. No evidence of mitral stenosis.  4.  Tricuspid valve regurgitation is moderate to severe.  5. The aortic valve is tricuspid. Aortic valve regurgitation is mild. Mild aortic valve sclerosis is present, with no evidence of aortic valve stenosis. FINDINGS  Left Ventricle: Left ventricular ejection  fraction, by estimation, is 35 to 40%. The left ventricle has moderately decreased function. The left ventricle demonstrates regional wall motion abnormalities. Severe hypokinesis of the left ventricular, mid-apical anteroseptal wall and apical segment. The left ventricular internal cavity size was normal in size. There is moderate left ventricular hypertrophy. Left ventricular diastolic parameters are consistent with Grade I diastolic dysfunction (impaired relaxation). Right Ventricle: There is at least mild pulmonary hypertension (PASP 35 mmHg plus central venous pressure). The right ventricular size is normal. No increase in right ventricular wall thickness. Right ventricular systolic function is normal. Left Atrium: Left atrial size was normal in size. Right Atrium: Right atrial size was normal in size. Pericardium: The pericardium was not well visualized. Mitral Valve: The mitral valve is grossly normal. Mild mitral valve regurgitation. No evidence of mitral valve stenosis. Tricuspid Valve: The tricuspid valve is not well visualized. Tricuspid valve regurgitation is moderate to severe. Aortic Valve: The aortic valve is tricuspid. . There is mild thickening of the aortic valve. Aortic valve regurgitation is mild. Mild aortic valve sclerosis is present, with no evidence of aortic valve stenosis. Mild aortic valve annular calcification. There is mild thickening of the aortic valve. Aortic valve mean gradient measures 5.0 mmHg. Aortic valve peak gradient measures 10.0 mmHg. Aortic valve area, by VTI measures 2.09 cm. Pulmonic Valve: The pulmonic valve was not well visualized. Pulmonic valve regurgitation is not visualized. Aorta: The aortic root is normal in  size and structure. Pulmonary Artery: The pulmonary artery is not well seen. Venous: The inferior vena cava was not well visualized. IAS/Shunts: The interatrial septum was not well visualized.  LEFT VENTRICLE PLAX 2D LVIDd:         3.70 cm     Diastology LVIDs:         2.45 cm     LV e' lateral:   6.09 cm/s LV PW:         1.26 cm     LV E/e' lateral: 13.0 LV IVS:        0.93 cm     LV e' medial:    5.87 cm/s LVOT diam:     2.00 cm     LV E/e' medial:  13.5 LV SV:         66 LV SV Index:   47 LVOT Area:     3.14 cm  LV Volumes (MOD) LV vol d, MOD A2C: 72.6 ml LV vol d, MOD A4C: 72.9 ml LV vol s, MOD A2C: 48.4 ml LV vol s, MOD A4C: 42.1 ml LV SV MOD A2C:     24.2 ml LV SV MOD A4C:     72.9 ml LV SV MOD BP:      26.8 ml RIGHT VENTRICLE RV Basal diam:  3.28 cm RV S prime:     14.00 cm/s TAPSE (M-mode): 4.2 cm LEFT ATRIUM             Index       RIGHT ATRIUM           Index LA diam:        3.10 cm 2.19 cm/m  RA Area:     11.70 cm LA Vol (A2C):   41.1 ml 28.97 ml/m RA Volume:   26.20 ml  18.47 ml/m LA Vol (A4C):   33.7 ml 23.75 ml/m LA Biplane Vol: 38.5 ml 27.14 ml/m  AORTIC VALVE  PULMONIC VALVE AV Area (Vmax):    2.25 cm     PV Vmax:        0.69 m/s AV Area (Vmean):   2.32 cm     PV Peak grad:   1.9 mmHg AV Area (VTI):     2.09 cm     RVOT Peak grad: 1 mmHg AV Vmax:           158.00 cm/s AV Vmean:          103.800 cm/s AV VTI:            0.316 m AV Peak Grad:      10.0 mmHg AV Mean Grad:      5.0 mmHg LVOT Vmax:         113.00 cm/s LVOT Vmean:        76.500 cm/s LVOT VTI:          0.210 m LVOT/AV VTI ratio: 0.67  AORTA Ao Root diam: 3.00 cm MITRAL VALVE                TRICUSPID VALVE MV Area (PHT): 4.83 cm     TR Peak grad:   34.1 mmHg MV Decel Time: 157 msec     TR Vmax:        292.00 cm/s MV E velocity: 79.30 cm/s MV A velocity: 120.00 cm/s  SHUNTS MV E/A ratio:  0.66         Systemic VTI:  0.21 m                             Systemic Diam: 2.00 cm Nelva Bush MD Electronically signed  by Nelva Bush MD Signature Date/Time: 01/20/2020/6:23:50 PM    Final    US Abdomen Limited RUQ  Result Date: 01/20/2020 CLINICAL DATA:  Abdominal pain. EXAM: ULTRASOUND ABDOMEN LIMITED RIGHT UPPER QUADRANT COMPARISON:  CT scan of the abdomen dated 01/19/2020, CT scan of the abdomen dated 02/23/2012 and PET CTs dated 02/04/2014 and 12/06/2017 FINDINGS: Gallbladder: Multiple small stones. No thickening of the gallbladder wall. Negative sonographic Murphy's sign. Tiny amount pericholecystic fluid. Common bile duct: Diameter: 5.4 mm,. Liver: There is a solid slightly hypoechoic 1.7 x 1.4 x 1 3 cm mass in the left lobe of the liver adjacent to the falciform ligament which correlates with the mass seen on the prior CT scan. There is also a 7 mm simple cyst in the left lobe of the liver. Liver parenchyma is otherwise normal. Portal vein is patent on color Doppler imaging with normal direction of blood flow towards the liver. Other: None. IMPRESSION: 1. Cholelithiasis. 2. Tiny amount of pericholecystic fluid, nonspecific. 3. Hypoechoic mass in the left lobe of the liver. This had the appearance of a simple cyst on 02/23/2012 and appears smaller than on that exam. It is smaller than on the prior PET-CT of 12/06/2017 and showed no hypermetabolic activity on that scan. It probably represents a complex but benign hepatic cyst. Electronically Signed   By: Lorriane Shire M.D.   On: 01/20/2020 08:31    Scheduled Meds: . loratadine  10 mg Oral Daily  . metoprolol tartrate  5 mg Intravenous Q6H  . pantoprazole (PROTONIX) IV  40 mg Intravenous Q24H   Continuous Infusions: . 0.9 % NaCl with KCl 20 mEq / L 50 mL/hr at 01/20/20 1455  . piperacillin-tazobactam (ZOSYN)  IV 3.375 g (01/21/20 1429)     LOS: 2 days  Time spent: 45 minutes.  Lorella Nimrod, MD [facility]  If 7PM-7AM, please contact night-coverage [email or web address]  01/21/2020, 5:34 PM   This record has been created using Therapist, sports. Errors have been sought and corrected,but may not always be located. Such creation errors do not reflect on the standard of care.

## 2020-01-21 NOTE — TOC Initial Note (Signed)
Transition of Care [facility]) - Initial/Assessment Note    Patient Details  Name: Jeanne Pittman MRN: [id number] Date of Birth: 11-Mar-1935  [facility] CM/SW Contact:    Elease Hashimoto, LCSW Phone Number: 01/21/2020, 9:37 AM  Clinical Narrative:   Met with pt and son who is at the bedside to discuss discharge plans. Pt lives alone and was very independent and active prior to admission. She drives and takes care of all of her needs. This intestinal infection has thrown her for a loop. Her son lives two hours away but is staying here while she is in the hospital. She mainly is weak and needs to get her strength back. She reports will be on a clear liquid diet starting tomorrow. She has no pref regarding home health and have made referral to [facility] for PT follow up at DC. She has no equipment needs at this time. Will follow along for any other DC needs.        Expected Discharge Plan: [place] Barriers to Discharge: Continued Medical Work up   Patient Goals and CMS Choice Patient states their goals for this hospitalization and ongoing recovery are:: I hope to be on a diet and feel better before I go home CMS Medicare.gov Compare Post Acute Care list provided to:: Patient Choice offered to / list presented to : Patient  Expected Discharge Plan and Services Expected Discharge Plan: [place] In-house Referral: Clinical Social Work   Post Acute Care Choice: Home Health                             HH Arranged: PT   Date [place]: 01/21/20 Time HH Agency Contacted: 3300 Representative spoke with at [place]: Corene Cornea  Prior Living Arrangements/Services   Lives with:: Self Patient language and need for interpreter reviewed:: No Do you feel safe going back to the place where you live?: Yes      Need for Family Participation in Patient Care: Yes (Comment) Care giver support system in place?: No (comment)   Criminal  Activity/Legal Involvement Pertinent to Current Situation/Hospitalization: No - Comment as needed  Activities of Daily Living Home Assistive Devices/Equipment: None ADL Screening (condition at time of admission) Patient's cognitive ability adequate to safely complete daily activities?: Yes Is the patient deaf or have difficulty hearing?: No Does the patient have difficulty seeing, even when wearing glasses/contacts?: No Does the patient have difficulty concentrating, remembering, or making decisions?: No Patient able to express need for assistance with ADLs?: Yes Does the patient have difficulty dressing or bathing?: No Independently performs ADLs?: Yes (appropriate for developmental age) Does the patient have difficulty walking or climbing stairs?: No Weakness of Legs: Both Weakness of Arms/Hands: Both  Permission Sought/Granted Permission sought to share information with : Family Supports, Chartered certified accountant granted to share information with : Yes, Verbal Permission Granted  Share Information with NAME: Marya Amsler  Permission granted to share info w AGENCY: [facility]  Permission granted to share info w Relationship: son  Permission granted to share info w Contact Information: jason  Emotional Assessment Appearance:: Appears stated age Attitude/Demeanor/Rapport: Engaged Affect (typically observed): Adaptable, Accepting Orientation: : Oriented to Self, Oriented to Place, Oriented to  Time, Oriented to Situation Alcohol / Substance Use: Never Used Psych Involvement: No (comment)  Admission diagnosis:  Diverticulitis [K57.92] Sigmoid diverticulitis [K57.32] Atrial fibrillation with RVR ([place]) [I48.91] Abdominal pain [  R10.9] Colonic diverticular abscess [K57.20] Syncope, unspecified syncope type [R55] Patient Active Problem List   Diagnosis Date Noted   Atrial fibrillation with RVR ([place]) 01/19/2020   Sigmoid diverticulitis 01/19/2020   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   H/O neoplasm 01/04/2015   Arterial vascular disease 12/30/2013   BP (high blood pressure) 12/30/2013   Adult hypothyroidism 12/30/2013   OP (osteoporosis) 12/30/2013   Avitaminosis D 12/30/2013   Atrial fibrillation ([place]) 12/30/2013   HLD (hyperlipidemia) 12/30/2013   Preoperative cardiovascular examination 04/07/2013   Hyperlipidemia 04/07/2013   A-fib ([place])    Coronary artery disease    Chronic systolic heart failure ([place])    PCP:  [name], MD Pharmacy:   [facility] [place], [place] [address] [place] [address] Phone: [phone] Fax: [phone]     Social Determinants of Health (SDOH) Interventions    Readmission Risk Interventions No flowsheet data found.

## 2020-01-21 NOTE — Evaluation (Signed)
Physical Therapy Evaluation Patient Details Name: Jeanne Pittman MRN: [id number] DOB: 07-21-1935 Today's Date: 01/21/2020   History of Present Illness  Pt is 85 yo female that presented to hospital with syncopal episode and abdominal pain. PMH of CHF with STEMI, HTN, s/p chemotherapy for non-Hodkin's lymphoma. Experienced afib with RVR during hospital stay, work up showed leukocytosis sigmoid diverticulitis  and R colon abscess.    Clinical Impression  Pt A&Ox4, family at bedside, denied pain but endorsed feeling weak. At baselines in a one story condo, neighbors check on pt, son calls pt daily. Independent, drives, grocery shops, etc. Denies any other falls.  The patient demonstrated bed mobility mod I several times, able to sit EOB with normal balance. Requested to brush teeth, PT assisted with set up (declined standing to brush teeth due to fatigue). Sit <> stand with supervision and pt able to ambulate ~53ft with supervision, intermittently reaches for unilateral support due to fatigue, pt has been NPO for several days.  PT, pt, and family reviewed importance of continued mobility, OOB to chair, and to bathroom and back as tolerated, pt verbalized understanding. Overall the patient demonstrated mild deficits (see "PT Problem List") that impede the patient's functional abilities, safety, and mobility and would benefit from skilled PT intervention. Recommendation is HHPT with intermittent supervision.     Follow Up Recommendations Home health PT;Supervision - Intermittent    Equipment Recommendations  None recommended by PT    Recommendations for Other Services       Precautions / Restrictions Precautions Precautions: Fall Precaution Comments: watch HR Restrictions Weight Bearing Restrictions: No      Mobility  Bed Mobility Overal bed mobility: Modified Independent                Transfers Overall transfer level: Needs assistance Equipment used: None Transfers: Sit  to/from Stand Sit to Stand: Supervision            Ambulation/Gait   Gait Distance (Feet): 35 Feet Assistive device: None   Gait velocity: decreased   General Gait Details: pt did reach for external support unilaterally, but able to ambulate without support, reported fatigue  Stairs            Wheelchair Mobility    Modified Rankin (Stroke Patients Only)       Balance Overall balance assessment: Needs assistance Sitting-balance support: Feet supported Sitting balance-Leahy Scale: Normal Sitting balance - Comments: able to brush teeth with set up seated EOB     Standing balance-Leahy Scale: Good                               Pertinent Vitals/Pain Pain Assessment: No/denies pain    Home Living Family/patient expects to be discharged to:: Private residence Living Arrangements: Alone Available Help at Discharge: Neighbor;Friend(s);Available PRN/intermittently Type of Home: Apartment(condo) Home Access: Level entry     Home Layout: One level Home Equipment: Walker - 2 wheels;Bedside commode;Cane - single point      Prior Function Level of Independence: Independent         Comments: drives, grocery shops, only fall prior to admission     Hand Dominance   Dominant Hand: Right    Extremity/Trunk Assessment   Upper Extremity Assessment Upper Extremity Assessment: Overall WFL for tasks assessed    Lower Extremity Assessment Lower Extremity Assessment: Generalized weakness    Cervical / Trunk Assessment Cervical / Trunk Assessment: Normal  Communication  Communication: No difficulties  Cognition Arousal/Alertness: Awake/alert Behavior During Therapy: WFL for tasks assessed/performed Overall Cognitive Status: Within Functional Limits for tasks assessed                                        General Comments      Exercises     Assessment/Plan    PT Assessment Patient needs continued PT services  PT  Problem List Decreased strength;Decreased mobility;Decreased activity tolerance;Decreased knowledge of use of DME       PT Treatment Interventions DME instruction;Therapeutic exercise;Gait training;Balance training;Stair training;Neuromuscular re-education;Functional mobility training;Therapeutic activities;Patient/family education    PT Goals (Current goals can be found in the Care Plan section)  Acute Rehab PT Goals Patient Stated Goal: to feel better, get her strength back PT Goal Formulation: With patient Time For Goal Achievement: 02/04/20 Potential to Achieve Goals: Good    Frequency Min 2X/week   Barriers to discharge        Co-evaluation               AM-PAC PT "6 Clicks" Mobility  Outcome Measure Help needed turning from your back to your side while in a flat bed without using bedrails?: None Help needed moving from lying on your back to sitting on the side of a flat bed without using bedrails?: None Help needed moving to and from a bed to a chair (including a wheelchair)?: None Help needed standing up from a chair using your arms (e.g., wheelchair or bedside chair)?: None Help needed to walk in hospital room?: A Little Help needed climbing 3-5 steps with a railing? : A Little 6 Click Score: 22    End of Session   Activity Tolerance: Patient tolerated treatment well;Patient limited by fatigue Patient left: in bed;with call bell/phone within reach;with bed alarm set Nurse Communication: Mobility status PT Visit Diagnosis: Other abnormalities of gait and mobility (R26.89);Muscle weakness (generalized) (M62.81)    Time: [id number] PT Time Calculation (min) (ACUTE ONLY): 19 min   Charges:   PT Evaluation $PT Eval Low Complexity: 1 Low PT Treatments $Therapeutic Activity: 8-22 mins        Lieutenant Diego PT, DPT 10:28 AM,01/21/20

## 2020-01-21 NOTE — Progress Notes (Addendum)
[facility] SURGICAL PROGRESS NOTE (cpt [phone])  Hospital Day(s): 2.   Interval History: Patient seen and examined, no acute events or new complaints overnight. Patient reports she is feeling much better this morning compared to days prior to presentation. She still has very mild LLQ soreness. No fever, chills, nausea, or emesis. Leukocytosis remains resolved with WBC of 7.4K, BMP is also reassuring, she is mildly hypoglycemic to 56. She has remained NPO since admission. Having bowel movements.  Review of Systems:  Constitutional: denies fever, chills  HEENT: denies cough or congestion  Respiratory: denies any shortness of breath  Cardiovascular: denies chest pain or palpitations  Gastrointestinal: + abdominal pain (markedly improved), denied N/V, or diarrhea/and bowel function as per interval history Genitourinary: denies burning with urination or urinary frequency  Vital signs in last 24 hours: [min-max] current  Temp:  [97.8 F (36.6 C)-98.7 F (37.1 C)] 97.8 F (36.6 C) (06/02 0840) Pulse Rate:  [77-80] 77 (06/02 0840) Resp:  [16] 16 (06/01 2331) BP: (108-135)/(49-60) 135/60 (06/02 0840) SpO2:  [95 %-100 %] 99 % (06/02 0840)     Height: 5\' 1"  (154.9 cm) Weight: 46.3 kg BMI (Calculated): 19.28   Intake/Output last 2 shifts:  No intake/output data recorded.   Physical Exam:  Constitutional: alert, cooperative and no distress  HENT: normocephalic without obvious abnormality  Eyes: PERRL, EOM's grossly intact and symmetric Respiratory: breathing non-labored at rest  Cardiovascular: regular rate and sinus rhythm  Gastrointestinal: Soft, improved LLQ abdominal pain, and non-distended, no rebound/guarding Musculoskeletal: no edema or wounds, motor and sensation grossly intact, NT    Labs:  CBC Latest Ref Rng & Units 01/21/2020 01/20/2020 01/19/2020  WBC 4.0 - 10.5 K/uL 7.4 8.9 15.2(H)  Hemoglobin 12.0 - 15.0 g/dL 10.1(L) 10.1(L) 12.3  Hematocrit 36.0 - 46.0 % 29.3(L)  28.6(L) 35.5(L)  Platelets 150 - 400 K/uL 127(L) 129(L) 88(L)   CMP Latest Ref Rng & Units 01/21/2020 01/20/2020 01/19/2020  Glucose 70 - 99 mg/dL 56(L) 86 105(H)  BUN 8 - 23 mg/dL 16 18 20   Creatinine 0.44 - 1.00 mg/dL 0.99 0.96 0.69  Sodium 135 - 145 mmol/L 140 139 134(L)  Potassium 3.5 - 5.1 mmol/L 3.7 3.7 3.9  Chloride 98 - 111 mmol/L 108 107 104  CO2 22 - 32 mmol/L 20(L) 24 21(L)  Calcium 8.9 - 10.3 mg/dL 8.4(L) 8.4(L) 8.2(L)  Total Protein 6.5 - 8.1 g/dL - - 6.3(L)  Total Bilirubin 0.3 - 1.2 mg/dL - - 1.7(H)  Alkaline Phos 38 - 126 U/L - - 42  AST 15 - 41 U/L - - 13(L)  ALT 0 - 44 U/L - - 10     Imaging studies: No new pertinent imaging studies   Assessment/Plan: (ICD-10's: K38.92) 85 y.o. female with clinically improving sigmoid diverticulitis with small abscess not amenable to drainage, complicated by multiple pertinent comorbidities.   - Will leave NPO one more day  - Continue IV ABx (Zosyn)    - Continue IVF resuscitation  - Monitor abdominal examination; on-going bowel function  - pain control prn; antiemetics prn  - monitor labs while NPO  - No emergent surgical intervention; understands role for elective sigmoid colectomy as outpatient once through this insult  - further management per primary service; we will follow    All of the above findings and recommendations were discussed with the patient, patient's family, and the medical team, and all of patient's and family's questions were answered to their expressed satisfaction.  -- Jeanne Simon, PA-C  [facility] 01/21/2020, 11:50 AM [phone] M-F: 7am - 4pm  I saw and evaluated the patient.  I agree with the above documentation, exam, and plan, which I have edited where appropriate. Jeanne Pittman  12:41 PM

## 2020-01-22 ENCOUNTER — Encounter: Payer: Self-pay | Admitting: Internal Medicine

## 2020-01-22 ENCOUNTER — Inpatient Hospital Stay: Payer: Medicare Other

## 2020-01-22 LAB — GLUCOSE, CAPILLARY: Glucose-Capillary: 194 mg/dL — ABNORMAL HIGH (ref 70–99)

## 2020-01-22 LAB — MAGNESIUM: Magnesium: 2.1 mg/dL (ref 1.7–2.4)

## 2020-01-22 LAB — URINALYSIS, ROUTINE W REFLEX MICROSCOPIC
Bacteria, UA: NONE SEEN
Bilirubin Urine: NEGATIVE
Glucose, UA: NEGATIVE mg/dL
Hgb urine dipstick: NEGATIVE
Ketones, ur: NEGATIVE mg/dL
Nitrite: NEGATIVE
Protein, ur: NEGATIVE mg/dL
Specific Gravity, Urine: >1.046 — ABNORMAL HIGH (ref 1.005–1.030)
pH: 5 (ref 5.0–8.0)

## 2020-01-22 LAB — BASIC METABOLIC PANEL
Anion gap: 15 (ref 5–15)
BUN: 19 mg/dL (ref 8–23)
CO2: 16 mmol/L — ABNORMAL LOW (ref 22–32)
Calcium: 8.7 mg/dL — ABNORMAL LOW (ref 8.9–10.3)
Chloride: 108 mmol/L (ref 98–111)
Creatinine, Ser: 1.15 mg/dL — ABNORMAL HIGH (ref 0.44–1.00)
GFR calc Af Amer: 51 mL/min — ABNORMAL LOW (ref >60)
GFR calc non Af Amer: 44 mL/min — ABNORMAL LOW (ref >60)
Glucose, Bld: 45 mg/dL — ABNORMAL LOW (ref 70–99)
Potassium: 4.2 mmol/L (ref 3.5–5.1)
Sodium: 139 mmol/L (ref 135–145)

## 2020-01-22 LAB — CBC
HCT: 30.7 % — ABNORMAL LOW (ref 36.0–46.0)
Hemoglobin: 10.8 g/dL — ABNORMAL LOW (ref 12.0–15.0)
MCH: 33.9 pg (ref 26.0–34.0)
MCHC: 35.2 g/dL (ref 30.0–36.0)
MCV: 96.2 fL (ref 80.0–100.0)
Platelets: 137 10*3/uL — ABNORMAL LOW (ref 150–400)
RBC: 3.19 MIL/uL — ABNORMAL LOW (ref 3.87–5.11)
RDW: 14.4 % (ref 11.5–15.5)
WBC: 7.1 10*3/uL (ref 4.0–10.5)
nRBC: 0 % (ref 0.0–0.2)

## 2020-01-22 LAB — LACTIC ACID, PLASMA
Lactic Acid, Venous: 0.8 mmol/L (ref 0.5–1.9)
Lactic Acid, Venous: 1.2 mmol/L (ref 0.5–1.9)

## 2020-01-22 LAB — PHOSPHORUS: Phosphorus: 3.7 mg/dL (ref 2.5–4.6)

## 2020-01-22 MED ORDER — IOHEXOL 9 MG/ML PO SOLN
500.0000 mL | ORAL | Status: AC
  Administered 2020-01-22 (×2): 500 mL via ORAL

## 2020-01-22 MED ORDER — LACTATED RINGERS IV SOLN: INTRAVENOUS | Status: DC

## 2020-01-22 MED ORDER — HYDROCORTISONE (PERIANAL) 2.5 % EX CREA
TOPICAL_CREAM | Freq: Two times a day (BID) | CUTANEOUS | Status: DC
  Administered 2020-01-22 – 2020-01-23 (×3): 1 via RECTAL
  Filled 2020-01-22 (×2): qty 28.35

## 2020-01-22 MED ORDER — DEXTROSE IN LACTATED RINGERS 5 % IV SOLN: INTRAVENOUS | Status: DC

## 2020-01-22 MED ORDER — IOHEXOL 300 MG/ML  SOLN
75.0000 mL | Freq: Once | INTRAMUSCULAR | Status: AC | PRN
  Administered 2020-01-22: 75 mL via INTRAVENOUS

## 2020-01-22 MED ORDER — ENOXAPARIN SODIUM 30 MG/0.3ML ~~LOC~~ SOLN
30.0000 mg | SUBCUTANEOUS | Status: DC
  Administered 2020-01-22: 30 mg via SUBCUTANEOUS
  Filled 2020-01-22: qty 0.3

## 2020-01-22 MED ORDER — DEXTROSE 50 % IV SOLN
INTRAVENOUS | Status: AC
  Administered 2020-01-22: 50 mL via INTRAVENOUS
  Filled 2020-01-22: qty 50

## 2020-01-22 NOTE — Progress Notes (Addendum)
[facility] SURGICAL PROGRESS NOTE (cpt [phone])  Hospital Day(s): 3.   Interval History: Patient seen and examined, no acute events or new complaints overnight. Patient reports she continues to improve. Abdominal pain nearly resolved. No fever, chills, nausea, or emesis. WBC remains normal at 7.1K this morning. Slight bump in sCr to 1.15 this morning, UO is unmeasured. Mild increase in acidosis. No electrolyte derangement seen, aside from hypoglycemia. Mobilizing with PT and recommending HHPT. Continues to have bowel function.   Review of Systems:  Constitutional: denies fever, chills  HEENT: denies cough or congestion  Respiratory: denies any shortness of breath  Cardiovascular: denies chest pain or palpitations  Gastrointestinal: denied abdominal pain, denied N/V, or diarrhea/and bowel function as per interval history Genitourinary: denies burning with urination or urinary frequency  Vital signs in last 24 hours: [min-max] current  Temp:  [97.8 F (36.6 C)-98.4 F (36.9 C)] 98.4 F (36.9 C) (06/03 0008) Pulse Rate:  [74-78] 74 (06/03 0008) Resp:  [16] 16 (06/03 0008) BP: (135-143)/(56-66) 143/56 (06/03 0008) SpO2:  [96 %-100 %] 96 % (06/03 0008)     Height: 5\' 1"  (154.9 cm) Weight: 46.3 kg BMI (Calculated): 19.28   Intake/Output last 2 shifts:  No intake/output data recorded.   Physical Exam:  Constitutional: alert, cooperative and no distress  HENT: normocephalic without obvious abnormality  Eyes: PERRL, EOM's grossly intact and symmetric Respiratory: breathing non-labored at rest  Cardiovascular: regular rate and sinus rhythm  Gastrointestinal: Soft, LLQ tenderness resolved, and non-distended, no rebound/guarding Musculoskeletal: no edema or wounds, motor and sensation grossly intact, NT    Labs:  CBC Latest Ref Rng & Units 01/22/2020 01/21/2020 01/20/2020  WBC 4.0 - 10.5 K/uL 7.1 7.4 8.9  Hemoglobin 12.0 - 15.0 g/dL 10.8(L) 10.1(L) 10.1(L)  Hematocrit 36.0 -  46.0 % 30.7(L) 29.3(L) 28.6(L)  Platelets 150 - 400 K/uL 137(L) 127(L) 129(L)   CMP Latest Ref Rng & Units 01/22/2020 01/21/2020 01/20/2020  Glucose 70 - 99 mg/dL 45(L) 56(L) 86  BUN 8 - 23 mg/dL 19 16 18   Creatinine 0.44 - 1.00 mg/dL 1.15(H) 0.99 0.96  Sodium 135 - 145 mmol/L 139 140 139  Potassium 3.5 - 5.1 mmol/L 4.2 3.7 3.7  Chloride 98 - 111 mmol/L 108 108 107  CO2 22 - 32 mmol/L 16(L) 20(L) 24  Calcium 8.9 - 10.3 mg/dL 8.7(L) 8.4(L) 8.4(L)  Total Protein 6.5 - 8.1 g/dL - - -  Total Bilirubin 0.3 - 1.2 mg/dL - - -  Alkaline Phos 38 - 126 U/L - - -  AST 15 - 41 U/L - - -  ALT 0 - 44 U/L - - -     Imaging studies: No new pertinent imaging studies   Assessment/Plan: (ICD-10's: K15.92) 85 y.o. female with clinically improved sigmoid diverticulitis with small abscess not amenable to drainage, complicated by multiple pertinent comorbidities.   - Will initiate CLD   - Continue IV ABx (Zosyn)               - Continue IVF resuscitation; change to D5LR for now.             - Monitor abdominal examination; on-going bowel function             - pain control prn; antiemetics prn   - Monitor renal function; morning labs   - No emergent surgical intervention; understands role for elective sigmoid colectomy as outpatient once through this insult             -  further management per primary service; we will follow     All of the above findings and recommendations were discussed with the patient, patient's family (son at bedside), and the medical team, and all of patient's and family's questions were answered to their expressed satisfaction.  -- [name], PA-C [facility] 01/22/2020, 6:46 AM [phone] M-F: 7am - 4pm   I saw and evaluated the patient.  I agree with the above documentation, exam, and plan, which I have edited where appropriate. [name]  8:50 AM

## 2020-01-22 NOTE — Progress Notes (Signed)
PROGRESS NOTE    Jeanne Pittman  [id number] DOB: 09-30-1934 DOA: 01/19/2020 PCP: [name], MD   Brief Narrative:  85 year old female with chronic combined systolic and diastolic CHF, history of STEMI with DES to LAD and RCA, hypertension, hypothyroidism, history of nonoxygen, s/p chemotherapy (in remission) presented with episode of syncope, lower abdominal cramping with episode of rapid A. fib in the ED, leukocytosis sigmoid diverticulitis and right colon abscess. Placed on empiric IV antibiotic.  Surgery involved in care.  Subjective: Patient was feeling little better when seen today.  No new complaints.  She was waiting to go to for CT abdomen.  Able to drink contrast without any difficulty.  Son was in the room.  Assessment & Plan:   Principal Problem:   Sigmoid diverticulitis Active Problems:   Coronary artery disease   Chronic systolic heart failure (HCC)   Atrial fibrillation with RVR (HCC)  SIRS secondary to sigmoid diverticulitis/right colon abscess No signs of sepsis.IR consulted for drainage of right colonic fluid but suggested that abscess was too small and not amenable to percutaneous drainage. Repeat CT abdomen with unchanged right colonic fluid and a new finding of a small crescentic intramural abscess with improvement in colonic edema. -Surgery is following-starting her on clear liquid diet today. -Continue Zosyn. -Continue IV fluid-changed to LR with D5 today. -Continue pain management. -Monitor for refeeding syndrome as she is high risk being n.p.o. for many days.  Metabolic acidosis.  Patient did developed metabolic acidosis with borderline anion gap of 15.  Patient is n.p.o. and might having ketones.  Lactic acid normal.  She was on normal saline for IVF which can also cause acidosis. -Check UA. -Change IV fluids with D5 and LR. -Continue to monitor.  Atrial fibrillation with RVR (HCC) Prior history of A. fib in the setting ofNSTEMI. Resolved on  short dose of amiodarone. [id number] (age, female, CHF, history of MI, hypertension). A. fib possibly triggered by infection. Will hold off on anticoagulation as patient has thrombocytopenia. -Initially treated with diltiazem currently rate controlled with IV Lopressor.  Syncope Likely secondary to pain/dehydration. A. fib on presentation. EKG and initial troponin negative. No chest pain symptoms or seizure activity. Monitor on telemetry.  PVCs and NSVT Normal magnesium.  Continue potassium with IV fluids.  ? Gallbladder distention with thickening Negative for gallstones on CT. Does not have any right lower quadrant tenderness or impaired LFTs.  Right upper ultrasound shows cholelithiasis without cholecystitis.  Hypoechoic mass in the left lobe suggest benign hepatic cyst.  Chronic systolic heart failure ([place]). Patient hypovolemic with poor p.o. intake.   Repeat echocardiogram with EF of 35 to 40% with grade 1 diastolic dysfunction and mild pulmonary hypertension. - Continue gentle hydration. -Strict I's/O and daily weight.  -Holding home meds (Lasix,  ACE inhibitor and statin) since patient NPO.. Follows with Dr. Fletcher Anon.  Coronary artery disease with history of MI s/p DES to LAD and RCA. Hold aspirin, beta-blocker and statin for now as patient is NPO.  History of non-Hodgkin's lymphoma Completed chemotherapy and in remission.  Thrombocytopenia Chronic.   Stable at baseline.  Objective: Vitals:   01/21/20 0840 01/21/20 1704 01/22/20 0008 01/22/20 0739  BP: 135/60 135/66 (!) 143/56 (!) 133/58  Pulse: 77 78 74 64  Resp:   16 18  Temp: 97.8 F (36.6 C) 97.8 F (36.6 C) 98.4 F (36.9 C) 98.1 F (36.7 C)  TempSrc:  Oral Oral Oral  SpO2: 99% 100% 96% 98%  Weight:  Height:       No intake or output data in the 24 hours ending 01/22/20 1452 Filed Weights   01/19/20 1249  Weight: 46.3 kg    Examination:  General exam: Frail elderly lady,  appears calm and comfortable  Respiratory system: Clear to auscultation. Respiratory effort normal. Cardiovascular system: S1 & S2 heard, RRR. No JVD, murmurs, rubs,  Gastrointestinal system: Soft, nontender, nondistended, bowel sounds positive. Central nervous system: Alert and oriented. No focal neurological deficits. Extremities: No edema, no cyanosis, pulses intact and symmetrical. Psychiatry: Judgement and insight appear normal. Mood & affect appropriate.    DVT prophylaxis: Lovenox Code Status: Full Family Communication: Son was updated at bedside Disposition Plan:  Status is: Inpatient  Remains inpatient appropriate because:Inpatient level of care appropriate due to severity of illness   Dispo: The patient is from: Home              Anticipated d/c is to: Home              Anticipated d/c date is: 2-3 days.              Patient currently is not medically stable to d/c.   Consultants:   Surgery  Procedures:  Antimicrobials:  Zosyn  Data Reviewed: I have personally reviewed following labs and imaging studies  CBC: Recent Labs  Lab 01/19/20 1309 01/20/20 0459 01/21/20 0449 01/22/20 0428  WBC 15.2* 8.9 7.4 7.1  NEUTROABS 9.4*  --   --   --   HGB 12.3 10.1* 10.1* 10.8*  HCT 35.5* 28.6* 29.3* 30.7*  MCV 95.7 95.0 94.5 96.2  PLT 88* 129* 127* 0000000*   Basic Metabolic Panel: Recent Labs  Lab 01/19/20 1309 01/20/20 0459 01/20/20 0514 01/21/20 0449 01/22/20 0428  NA 134* 139  --  140 139  K 3.9 3.7  --  3.7 4.2  CL 104 107  --  108 108  CO2 21* 24  --  20* 16*  GLUCOSE 105* 86  --  56* 45*  BUN 20 18  --  16 19  CREATININE 0.69 0.96  --  0.99 1.15*  CALCIUM 8.2* 8.4*  --  8.4* 8.7*  MG 1.9  --  2.1  --  2.1  PHOS  --   --   --   --  3.7   GFR: Estimated Creatinine Clearance: 27.6 mL/min (A) (by C-G formula based on SCr of 1.15 mg/dL (H)). Liver Function Tests: Recent Labs  Lab 01/19/20 1309  AST 13*  ALT 10  ALKPHOS 42  BILITOT 1.7*  PROT 6.3*    ALBUMIN 3.2*   Recent Labs  Lab 01/19/20 1309  LIPASE 30   No results for input(s): AMMONIA in the last 168 hours. Coagulation Profile: No results for input(s): INR, PROTIME in the last 168 hours. Cardiac Enzymes: No results for input(s): CKTOTAL, CKMB, CKMBINDEX, TROPONINI in the last 168 hours. BNP (last 3 results) No results for input(s): PROBNP in the last 8760 hours. HbA1C: No results for input(s): HGBA1C in the last 72 hours. CBG: Recent Labs  Lab 01/22/20 0736  GLUCAP 194*   Lipid Profile: No results for input(s): CHOL, HDL, LDLCALC, TRIG, CHOLHDL, LDLDIRECT in the last 72 hours. Thyroid Function Tests: Recent Labs    01/19/20 1700  TSH 1.471   Anemia Panel: No results for input(s): VITAMINB12, FOLATE, FERRITIN, TIBC, IRON, RETICCTPCT in the last 72 hours. Sepsis Labs: Recent Labs  Lab 01/19/20 1740 01/19/20 2144 01/22/20 0754 01/22/20 1019  LATICACIDVEN 1.7 1.6 0.8 1.2    Recent Results (from the past 240 hour(s))  SARS Coronavirus 2 by RT PCR (hospital order, performed in [facility] hospital lab) Nasopharyngeal Nasopharyngeal Swab     Status: None   Collection Time: 01/19/20  3:56 PM   Specimen: Nasopharyngeal Swab  Result Value Ref Range Status   SARS Coronavirus 2 NEGATIVE NEGATIVE Final    Comment: (NOTE) SARS-CoV-2 target nucleic acids are NOT DETECTED. The SARS-CoV-2 RNA is generally detectable in upper and lower respiratory specimens during the acute phase of infection. The lowest concentration of SARS-CoV-2 viral copies this assay can detect is 250 copies / mL. A negative result does not preclude SARS-CoV-2 infection and should not be used as the sole basis for treatment or other patient management decisions.  A negative result may occur with improper specimen collection / handling, submission of specimen other than nasopharyngeal swab, presence of viral mutation(s) within the areas targeted by this assay, and inadequate number of viral  copies (<250 copies / mL). A negative result must be combined with clinical observations, patient history, and epidemiological information. Fact Sheet for Patients:   StrictlyIdeas.no Fact Sheet for Healthcare Providers: BankingDealers.co.za This test is not yet approved or cleared  by the [place] FDA and has been authorized for detection and/or diagnosis of SARS-CoV-2 by FDA under an Emergency Use Authorization (EUA).  This EUA will remain in effect (meaning this test can be used) for the duration of the COVID-19 declaration under Section 564(b)(1) of the Act, 21 U.S.C. section 360bbb-3(b)(1), unless the authorization is terminated or revoked sooner. Performed at [facility], [address], [place], [place] [address]      Radiology Studies: CT ABDOMEN PELVIS W CONTRAST  Result Date: 01/22/2020 CLINICAL DATA:  Follow-up diverticulitis with abscess. Small abscess not amenable to drainage, improved abdominal pain with stable white blood cell count. EXAM: CT ABDOMEN AND PELVIS WITH CONTRAST TECHNIQUE: Multidetector CT imaging of the abdomen and pelvis was performed using the standard protocol following bolus administration of intravenous contrast. CONTRAST:  49mL OMNIPAQUE IOHEXOL 300 MG/ML  SOLN COMPARISON:  CT 01/19/2020, abdominal ultrasound 01/20/2020. PET CT 02/05/2019 reviewed. FINDINGS: Lower chest: Small bilateral pleural effusions, right greater than left, new from prior. Adjacent compressive atelectasis. Coronary artery calcifications. Hepatobiliary: Low-density lesion in the medial left hepatic lobe measuring 15 mm, not significantly changed from recent exam. Additional simple cysts scattered throughout the left and right lobes of the liver. Gallbladder is distended. Phrygian cap distally. Wall thickening versus pericholecystic fluid, for example series 2, image 35. Small stones on recent ultrasound are not well  demonstrated. No common bile duct dilatation. Pancreas: No ductal dilatation or inflammation. Spleen: Small low-density lesions in the superior spleen, unchanged. No new abnormality. Adrenals/Urinary Tract: Adrenal thickening without dominant nodule. No hydronephrosis or perinephric edema. Homogeneous renal enhancement with symmetric excretion on delayed phase imaging. Small renal cysts again seen. Urinary bladder is physiologically distended without wall thickening. Stomach/Bowel: Persistent but improved sigmoid colonic wall thickening from recent exam. Improvement in pericolonic edema. Stable small fluid density structure in the right pelvis spanning 1.5 cm, series 2, image 61. Possible developing crescentic shaped intramural abscess in the sigmoid spanning 2.3 cm, for example series 2, image 63 and series 5, image 47. Multiple additional noninflamed diverticula seen in the distal colon. There is no free air. Normal small bowel without obstruction, administered enteric contrast is seen in the colon. Decompressed stomach. Prominent periampullary duodenal diverticulum again seen. Appendix not definitively  visualized. Vascular/Lymphatic: Abdominal aortic atherosclerosis without aneurysm. Patent portal vein. Retroperitoneal adenopathy again seen. For example 16 mm left periaortic node series 2, image 32, unchanged. Right IV seen node situated anterior to the psoas muscle measures 13 mm, series 3, image 38, also unchanged. There are prominent left external iliac nodes. No new adenopathy. Reproductive: Status post hysterectomy. No adnexal masses. Other: No free air. No ascites or free fluid. Improvement in inflammatory stranding about the sigmoid colon. Musculoskeletal: There are no acute or suspicious osseous abnormalities. Degenerative change throughout spine. IMPRESSION: 1. Persistent but improved sigmoid colonic wall thickening from recent exam. Improvement in pericolonic edema. Possible developing crescentic shaped  intramural abscess in the sigmoid spanning 2.3 cm. No free air. 2. Small right pelvic fluid collection is unchanged spanning 15 mm greatest dimension. 3. Distended gallbladder with wall thickening versus pericholecystic fluid. Small stones on recent ultrasound are not well demonstrated by CT. 4. Unchanged retroperitoneal adenopathy from recent exam. 5. New small bilateral pleural effusions, right greater than left. 6. Additional stable chronic findings as described. Aortic Atherosclerosis (ICD10-I70.0). Electronically Signed   By: Keith Rake M.D.   On: 01/22/2020 13:01    Scheduled Meds: . enoxaparin (LOVENOX) injection  30 mg Subcutaneous Q24H  . hydrocortisone   Rectal BID  . loratadine  10 mg Oral Daily  . metoprolol tartrate  5 mg Intravenous Q6H  . pantoprazole (PROTONIX) IV  40 mg Intravenous Q24H   Continuous Infusions: . dextrose 5% lactated ringers 75 mL/hr at 01/22/20 0830  . piperacillin-tazobactam (ZOSYN)  IV 3.375 g (01/22/20 0910)     LOS: 3 days   Time spent: 45 minutes.  Lorella Nimrod, MD [facility]  If 7PM-7AM, please contact night-coverage [email or web address]  01/22/2020, 2:52 PM   This record has been created using Systems analyst. Errors have been sought and corrected,but may not always be located. Such creation errors do not reflect on the standard of care.

## 2020-01-22 NOTE — Consult Note (Signed)
PHARMACY CONSULT NOTE - FOLLOW UP  Pharmacy Consult for Electrolyte Monitoring and Replacement   Recent Labs: Potassium (mmol/L)  Date Value  01/22/2020 4.2  09/07/2014 4.0   Magnesium (mg/dL)  Date Value  01/22/2020 2.1  11/01/2011 2.1   Calcium (mg/dL)  Date Value  01/22/2020 8.7 (L)   Calcium, Total (mg/dL)  Date Value  09/07/2014 8.8   Albumin (g/dL)  Date Value  01/19/2020 3.2 (L)  09/02/2015 4.2  09/07/2014 3.8   Phosphorus (mg/dL)  Date Value  01/22/2020 3.7   Sodium (mmol/L)  Date Value  01/22/2020 139  09/07/2014 141     Assessment: Jeanne Pittman. Jeanne Pittman is 85yo female admitted on 01/19/2020 with abdominal pain and syncope. Pt has not been eating well and is at risk for refeeding syndrome. Pharmacy has been consulted to manage electrolytes.   Goal of Therapy:  Electrolytes WNL, K ~4, Mg ~2, Phos ~3  Plan:  Electrolytes WNL today, replacement not needed at this time.  BMP with Mg and Phos have been ordered for the morning.  Will continue to monitor and replace as indicated.   Durward Fortes  PharmD-Student  01/22/2020 1:31 PM

## 2020-01-22 NOTE — Care Management Important Message (Signed)
Important Message  Patient Details  Name: Jeanne Pittman MRN: [id number] Date of Birth: 1935-06-26   Medicare Important Message Given:  Yes     Dannette Barbara 01/22/2020, 1:57 PM

## 2020-01-23 LAB — BASIC METABOLIC PANEL
Anion gap: 7 (ref 5–15)
BUN: 9 mg/dL (ref 8–23)
CO2: 22 mmol/L (ref 22–32)
Calcium: 8.5 mg/dL — ABNORMAL LOW (ref 8.9–10.3)
Chloride: 110 mmol/L (ref 98–111)
Creatinine, Ser: 0.97 mg/dL (ref 0.44–1.00)
GFR calc Af Amer: >60 mL/min (ref >60)
GFR calc non Af Amer: 54 mL/min — ABNORMAL LOW (ref >60)
Glucose, Bld: 120 mg/dL — ABNORMAL HIGH (ref 70–99)
Potassium: 3.4 mmol/L — ABNORMAL LOW (ref 3.5–5.1)
Sodium: 139 mmol/L (ref 135–145)

## 2020-01-23 LAB — PHOSPHORUS: Phosphorus: 2.7 mg/dL (ref 2.5–4.6)

## 2020-01-23 LAB — MAGNESIUM: Magnesium: 1.8 mg/dL (ref 1.7–2.4)

## 2020-01-23 MED ORDER — MELATONIN 5 MG PO TABS: 5.0000 mg | ORAL_TABLET | Freq: Every evening | ORAL | Status: DC | PRN

## 2020-01-23 MED ORDER — ENOXAPARIN SODIUM 40 MG/0.4ML ~~LOC~~ SOLN
40.0000 mg | SUBCUTANEOUS | Status: DC
  Administered 2020-01-23: 40 mg via SUBCUTANEOUS
  Filled 2020-01-23 (×2): qty 0.4

## 2020-01-23 MED ORDER — MAGNESIUM OXIDE 400 (241.3 MG) MG PO TABS
400.0000 mg | ORAL_TABLET | Freq: Two times a day (BID) | ORAL | Status: AC
  Administered 2020-01-23 (×2): 400 mg via ORAL
  Filled 2020-01-23 (×2): qty 1

## 2020-01-23 MED ORDER — POTASSIUM CHLORIDE CRYS ER 20 MEQ PO TBCR
40.0000 meq | EXTENDED_RELEASE_TABLET | Freq: Once | ORAL | Status: AC
  Administered 2020-01-23: 40 meq via ORAL
  Filled 2020-01-23: qty 2

## 2020-01-23 NOTE — Progress Notes (Signed)
PT Cancellation Note  Patient Details Name: [name] MRN: [id number] DOB: [birth year]   Cancelled Treatment:    Reason Eval/Treat Not Completed: Other (comment)   Offered and encouraged session.  Son in room.  Both state that pt is moving better in the room and is walking to/from bathroom and managing IV pole on her own.  She  Stated she is feeling stronger daily but declines therapy at this time due to general fatigue and needing to get up frequently to use that bathroom and wants to save her energy for that.  She stated she has all needed equipment at home and feels comfortable with mobility.  Son agrees.  Requests to defer therapy today.    Chesley Noon 01/23/2020, 9:40 AM

## 2020-01-23 NOTE — Progress Notes (Signed)
Pharmacy Antibiotic Note  Jeanne Pittman is a 85 y.o. female admitted on 01/19/2020 with intra abdominal.    Pharmacy has been consulted for Zosyn  dosing.  Plan: Continue Zosyn 3.375g IV q8h (4 hour infusion).  Height: 5\' 1"  (154.9 cm) Weight: 46.3 kg (102 lb) IBW/kg (Calculated) : 47.8  Temp (24hrs), Avg:98.1 F (36.7 C), Min:98 F (36.7 C), Max:98.1 F (36.7 C)  Recent Labs  Lab 01/19/20 1309 01/19/20 1740 01/19/20 2144 01/20/20 0459 01/21/20 0449 01/22/20 0428 01/22/20 0754 01/22/20 1019 01/23/20 0543  WBC 15.2*  --   --  8.9 7.4 7.1  --   --   --   CREATININE 0.69  --   --  0.96 0.99 1.15*  --   --  0.97  LATICACIDVEN  --  1.7 1.6  --   --   --  0.8 1.2  --     Estimated Creatinine Clearance: 32.7 mL/min (by C-G formula based on SCr of 0.97 mg/dL).    Allergies  Allergen Reactions  . Ace Inhibitors     Other reaction(s): Cough  . Benadryl [Diphenhydramine Hcl]     Other reaction(s): Unknown  . Codeine     Other reaction(s): Hallucination  . Diphenhydramine   . Guaiacol   . Hydrocodone     Other reaction(s): Hallucination  . Cardizem [Diltiazem] Rash  . Guaifenesin & Derivatives Rash    Antimicrobials this admission:  Metronidazole 5/31 >> 6/1  Zosyn 5/31 >>   Dose adjustments this admission: None  Microbiology results: COVID NEG - no cultures pending   Thank you for allowing pharmacy to be a part of this patient's care.  Lu Duffel, PharmD, BCPS Clinical Pharmacist 01/23/2020 7:39 AM

## 2020-01-23 NOTE — Progress Notes (Signed)
PROGRESS NOTE    Jeanne Pittman  UDJ:[id number] DOB: [birth year] DOA: 01/19/2020 PCP: [name], MD   Brief Narrative:  85 year old female with chronic combined systolic and diastolic CHF, history of STEMI with DES to LAD and RCA, hypertension, hypothyroidism, history of nonoxygen, s/p chemotherapy (in remission) presented with episode of syncope, lower abdominal cramping with episode of rapid A. fib in the ED, leukocytosis sigmoid diverticulitis and right colon abscess. Placed on empiric IV antibiotic.  Surgery involved in care.  Subjective: Patient has no new complaints.  She was able to tolerate full liquid diet this morning.  Stating that she has to have a soft BM this time she is eating something.  Son was in the room.  Assessment & Plan:   Principal Problem:   Sigmoid diverticulitis Active Problems:   Coronary artery disease   Chronic systolic heart failure (HCC)   Atrial fibrillation with RVR (HCC)  SIRS secondary to sigmoid diverticulitis/right colon abscess No signs of sepsis.IR consulted for drainage of right colonic fluid but suggested that abscess was too small and not amenable to percutaneous drainage. Repeat CT abdomen with unchanged right colonic fluid and a new finding of a small crescentic intramural abscess with improvement in colonic edema. -Surgery is following-diet was advanced to full liquid today.  There is no need for any surgery at this time. -Continue Zosyn. -Continue IV fluid-discontinue once able to tolerate more p.o. -Continue pain management. -Monitor for refeeding syndrome as she is high risk being n.p.o. for many days.  Postprandial diarrhea.  Patient was n.p.o. for many days.  She took contrast for CT yesterday.  Started p.o. after many days.  No leukocytosis or abdominal pain so less likely C. Difficile.  Metabolic acidosis.  Resolved with change in IV fluid. -Check UA-for ketones. -Continue to monitor.  Atrial fibrillation with RVR  (HCC) Prior history of A. fib in the setting ofNSTEMI. Resolved on short dose of amiodarone. [id number] of79 (age, female, CHF, history of MI, hypertension). A. fib possibly triggered by infection. Will hold off on anticoagulation as patient has thrombocytopenia. -Initially treated with diltiazem currently rate controlled with IV Lopressor.  Syncope Likely secondary to pain/dehydration. A. fib on presentation. EKG and initial troponin negative. No chest pain symptoms or seizure activity. Monitor on telemetry.  PVCs and NSVT Normal magnesium.  Continue potassium with IV fluids.  ? Gallbladder distention with thickening Negative for gallstones on CT. Does not have any right lower quadrant tenderness or impaired LFTs.  Right upper ultrasound shows cholelithiasis without cholecystitis.  Hypoechoic mass in the left lobe suggest benign hepatic cyst.  Chronic systolic heart failure ([place]). Patient hypovolemic with poor p.o. intake.   Repeat echocardiogram with EF of 35 to 40% with grade 1 diastolic dysfunction and mild pulmonary hypertension. - Continue gentle hydration. -Strict I's/O and daily weight.  -Restart home medications as she is able to take p.o. now -Follows with Dr. Fletcher Anon.  Coronary artery disease with history of MI s/p DES to LAD and RCA. Hold aspirin, beta-blocker and statin for now as patient is NPO.  History of non-Hodgkin's lymphoma Completed chemotherapy and in remission.  Thrombocytopenia Chronic.   Stable at baseline.  Objective: Vitals:   01/22/20 0008 01/22/20 0739 01/22/20 2244 01/23/20 0819  BP: (!) 143/56 (!) 133/58 (!) 134/58 (!) 122/55  Pulse: 74 64 69 63  Resp: 16 18 14 17   Temp: 98.4 F (36.9 C) 98.1 F (36.7 C) 98 F (36.7 C) 97.9 F (36.6 C)  TempSrc: Oral  Oral Oral Oral  SpO2: 96% 98% 99% 99%  Weight:      Height:        Intake/Output Summary (Last 24 hours) at 01/23/2020 1441 Last data filed at 01/23/2020 0400 Gross per 24  hour  Intake 1070 ml  Output --  Net 1070 ml   Filed Weights   01/19/20 1249  Weight: 46.3 kg    Examination:  General exam: Frail elderly lady, appears calm and comfortable  Respiratory system: Clear to auscultation. Respiratory effort normal. Cardiovascular system: S1 & S2 heard, RRR. No JVD, murmurs, rubs,  Gastrointestinal system: Soft, nontender, nondistended, bowel sounds positive. Central nervous system: Alert and oriented. No focal neurological deficits. Extremities: No edema, no cyanosis, pulses intact and symmetrical. Psychiatry: Judgement and insight appear normal. Mood & affect appropriate.    DVT prophylaxis: Lovenox Code Status: Full Family Communication: Son was updated at bedside Disposition Plan:  Status is: Inpatient  Remains inpatient appropriate because:Inpatient level of care appropriate due to severity of illness   Dispo: The patient is from: Home              Anticipated d/c is to: Home              Anticipated d/c date is: 1-2 days.              Patient currently is not medically stable to d/c.   Consultants:   Surgery  Procedures:  Antimicrobials:  Zosyn  Data Reviewed: I have personally reviewed following labs and imaging studies  CBC: Recent Labs  Lab 01/19/20 1309 01/20/20 0459 01/21/20 0449 01/22/20 0428  WBC 15.2* 8.9 7.4 7.1  NEUTROABS 9.4*  --   --   --   HGB 12.3 10.1* 10.1* 10.8*  HCT 35.5* 28.6* 29.3* 30.7*  MCV 95.7 95.0 94.5 96.2  PLT 88* 129* 127* 588*   Basic Metabolic Panel: Recent Labs  Lab 01/19/20 1309 01/20/20 0459 01/20/20 0514 01/21/20 0449 01/22/20 0428 01/23/20 0543  NA 134* 139  --  140 139 139  K 3.9 3.7  --  3.7 4.2 3.4*  CL 104 107  --  108 108 110  CO2 21* 24  --  20* 16* 22  GLUCOSE 105* 86  --  56* 45* 120*  BUN 20 18  --  16 19 9   CREATININE 0.69 0.96  --  0.99 1.15* 0.97  CALCIUM 8.2* 8.4*  --  8.4* 8.7* 8.5*  MG 1.9  --  2.1  --  2.1 1.8  PHOS  --   --   --   --  3.7 2.7    GFR: Estimated Creatinine Clearance: 32.7 mL/min (by C-G formula based on SCr of 0.97 mg/dL). Liver Function Tests: Recent Labs  Lab 01/19/20 1309  AST 13*  ALT 10  ALKPHOS 42  BILITOT 1.7*  PROT 6.3*  ALBUMIN 3.2*   Recent Labs  Lab 01/19/20 1309  LIPASE 30   No results for input(s): AMMONIA in the last 168 hours. Coagulation Profile: No results for input(s): INR, PROTIME in the last 168 hours. Cardiac Enzymes: No results for input(s): CKTOTAL, CKMB, CKMBINDEX, TROPONINI in the last 168 hours. BNP (last 3 results) No results for input(s): PROBNP in the last 8760 hours. HbA1C: No results for input(s): HGBA1C in the last 72 hours. CBG: Recent Labs  Lab 01/22/20 0736  GLUCAP 194*   Lipid Profile: No results for input(s): CHOL, HDL, LDLCALC, TRIG, CHOLHDL, LDLDIRECT in the last 72 hours. Thyroid  Function Tests: No results for input(s): TSH, T4TOTAL, FREET4, T3FREE, THYROIDAB in the last 72 hours. Anemia Panel: No results for input(s): VITAMINB12, FOLATE, FERRITIN, TIBC, IRON, RETICCTPCT in the last 72 hours. Sepsis Labs: Recent Labs  Lab 01/19/20 1740 01/19/20 2144 01/22/20 0754 01/22/20 1019  LATICACIDVEN 1.7 1.6 0.8 1.2    Recent Results (from the past 240 hour(s))  SARS Coronavirus 2 by RT PCR (hospital order, performed in [facility] hospital lab) Nasopharyngeal Nasopharyngeal Swab     Status: None   Collection Time: 01/19/20  3:56 PM   Specimen: Nasopharyngeal Swab  Result Value Ref Range Status   SARS Coronavirus 2 NEGATIVE NEGATIVE Final    Comment: (NOTE) SARS-CoV-2 target nucleic acids are NOT DETECTED. The SARS-CoV-2 RNA is generally detectable in upper and lower respiratory specimens during the acute phase of infection. The lowest concentration of SARS-CoV-2 viral copies this assay can detect is 250 copies / mL. A negative result does not preclude SARS-CoV-2 infection and should not be used as the sole basis for treatment or other patient  management decisions.  A negative result may occur with improper specimen collection / handling, submission of specimen other than nasopharyngeal swab, presence of viral mutation(s) within the areas targeted by this assay, and inadequate number of viral copies (<250 copies / mL). A negative result must be combined with clinical observations, patient history, and epidemiological information. Fact Sheet for Patients:   StrictlyIdeas.no Fact Sheet for Healthcare Providers: BankingDealers.co.za This test is not yet approved or cleared  by the [place] FDA and has been authorized for detection and/or diagnosis of SARS-CoV-2 by FDA under an Emergency Use Authorization (EUA).  This EUA will remain in effect (meaning this test can be used) for the duration of the COVID-19 declaration under Section 564(b)(1) of the Act, 21 U.S.C. section 360bbb-3(b)(1), unless the authorization is terminated or revoked sooner. Performed at [facility], [address] [place], [place] [address]      Radiology Studies: CT ABDOMEN PELVIS W CONTRAST  Result Date: 01/22/2020 CLINICAL DATA:  Follow-up diverticulitis with abscess. Small abscess not amenable to drainage, improved abdominal pain with stable white blood cell count. EXAM: CT ABDOMEN AND PELVIS WITH CONTRAST TECHNIQUE: Multidetector CT imaging of the abdomen and pelvis was performed using the standard protocol following bolus administration of intravenous contrast. CONTRAST:  77mL OMNIPAQUE IOHEXOL 300 MG/ML  SOLN COMPARISON:  CT 01/19/2020, abdominal ultrasound 01/20/2020. PET CT 02/05/2019 reviewed. FINDINGS: Lower chest: Small bilateral pleural effusions, right greater than left, new from prior. Adjacent compressive atelectasis. Coronary artery calcifications. Hepatobiliary: Low-density lesion in the medial left hepatic lobe measuring 15 mm, not significantly changed from recent exam. Additional  simple cysts scattered throughout the left and right lobes of the liver. Gallbladder is distended. Phrygian cap distally. Wall thickening versus pericholecystic fluid, for example series 2, image 35. Small stones on recent ultrasound are not well demonstrated. No common bile duct dilatation. Pancreas: No ductal dilatation or inflammation. Spleen: Small low-density lesions in the superior spleen, unchanged. No new abnormality. Adrenals/Urinary Tract: Adrenal thickening without dominant nodule. No hydronephrosis or perinephric edema. Homogeneous renal enhancement with symmetric excretion on delayed phase imaging. Small renal cysts again seen. Urinary bladder is physiologically distended without wall thickening. Stomach/Bowel: Persistent but improved sigmoid colonic wall thickening from recent exam. Improvement in pericolonic edema. Stable small fluid density structure in the right pelvis spanning 1.5 cm, series 2, image 61. Possible developing crescentic shaped intramural abscess in the sigmoid spanning 2.3 cm, for  example series 2, image 63 and series 5, image 47. Multiple additional noninflamed diverticula seen in the distal colon. There is no free air. Normal small bowel without obstruction, administered enteric contrast is seen in the colon. Decompressed stomach. Prominent periampullary duodenal diverticulum again seen. Appendix not definitively visualized. Vascular/Lymphatic: Abdominal aortic atherosclerosis without aneurysm. Patent portal vein. Retroperitoneal adenopathy again seen. For example 16 mm left periaortic node series 2, image 32, unchanged. Right IV seen node situated anterior to the psoas muscle measures 13 mm, series 3, image 38, also unchanged. There are prominent left external iliac nodes. No new adenopathy. Reproductive: Status post hysterectomy. No adnexal masses. Other: No free air. No ascites or free fluid. Improvement in inflammatory stranding about the sigmoid colon. Musculoskeletal: There  are no acute or suspicious osseous abnormalities. Degenerative change throughout spine. IMPRESSION: 1. Persistent but improved sigmoid colonic wall thickening from recent exam. Improvement in pericolonic edema. Possible developing crescentic shaped intramural abscess in the sigmoid spanning 2.3 cm. No free air. 2. Small right pelvic fluid collection is unchanged spanning 15 mm greatest dimension. 3. Distended gallbladder with wall thickening versus pericholecystic fluid. Small stones on recent ultrasound are not well demonstrated by CT. 4. Unchanged retroperitoneal adenopathy from recent exam. 5. New small bilateral pleural effusions, right greater than left. 6. Additional stable chronic findings as described. Aortic Atherosclerosis (ICD10-I70.0). Electronically Signed   By: Keith Rake M.D.   On: 01/22/2020 13:01    Scheduled Meds: . enoxaparin (LOVENOX) injection  40 mg Subcutaneous Q24H  . hydrocortisone   Rectal BID  . loratadine  10 mg Oral Daily  . magnesium oxide  400 mg Oral BID  . metoprolol tartrate  5 mg Intravenous Q6H  . pantoprazole (PROTONIX) IV  40 mg Intravenous Q24H   Continuous Infusions: . dextrose 5% lactated ringers 75 mL/hr at 01/23/20 1254  . piperacillin-tazobactam (ZOSYN)  IV 3.375 g (01/23/20 0558)     LOS: 4 days   Time spent: 40 minutes.  Lorella Nimrod, MD [facility]  If 7PM-7AM, please contact night-coverage [email or web address]  01/23/2020, 2:41 PM   This record has been created using Systems analyst. Errors have been sought and corrected,but may not always be located. Such creation errors do not reflect on the standard of care.

## 2020-01-23 NOTE — Consult Note (Signed)
PHARMACY CONSULT NOTE - FOLLOW UP  Pharmacy Consult for Electrolyte Monitoring and Replacement   Recent Labs: Potassium (mmol/L)  Date Value  01/23/2020 3.4 (L)  09/07/2014 4.0   Magnesium (mg/dL)  Date Value  01/23/2020 1.8  11/01/2011 2.1   Calcium (mg/dL)  Date Value  01/23/2020 8.5 (L)   Calcium, Total (mg/dL)  Date Value  09/07/2014 8.8   Albumin (g/dL)  Date Value  01/19/2020 3.2 (L)  09/02/2015 4.2  09/07/2014 3.8   Phosphorus (mg/dL)  Date Value  01/23/2020 2.7   Sodium (mmol/L)  Date Value  01/23/2020 139  09/07/2014 141    Assessment: Jeanne Pittman is 85yo female admitted on 01/19/2020 with abdominal pain and syncope. Pt has not been eating well and is at risk for refeeding syndrome. Pharmacy has been consulted to manage electrolytes.   Goal of Therapy:  Electrolytes WNL, K ~4, Mg ~2, Phos ~3  Plan:  K = 3.4,  repleted with 40 meq KCl po Mg = 1.8, repleted with 400mg  po MagOx x 2   BMP with Mg and Phos have been ordered for the morning.  Will continue to monitor and replace as indicated.   Lu Duffel, PharmD, BCPS Clinical Pharmacist 01/23/2020 7:34 AM

## 2020-01-23 NOTE — TOC Progression Note (Addendum)
Transition of Care [facility]) - Progression Note    Patient Details  Name: Jeanne Pittman MRN: [id number] Date of Birth: 30-Jul-1935  Transition of Care [facility]) CM/SW Contact  Beverlee Wilmarth, Gardiner Rhyme, LCSW Phone Number: 01/23/2020, 11:09 AM  Clinical Narrative:   MD reports pt may be medically ready to go home tomorrow wants to add an adie to home health order. She will get PT and aide through [facility]. Pt has no equipment needs. Son still here and will stay for another day or so to make sure she is transitioned home. Doing better on the diet she is on today.  11:13 AM Added RN to home health order MD wants this. Have contacted [email or web address] to let know  Expected Discharge Plan: [place] Barriers to Discharge: Continued Medical Work up  Expected Discharge Plan and Services Expected Discharge Plan: [place] In-house Referral: Clinical Social Work   Post Acute Care Choice: Home Health                             HH Arranged: PT   Date [place]: 01/21/20 Time [place]: [phone] Representative spoke with at [place]: [place]) Interventions    Readmission Risk Interventions No flowsheet data found.

## 2020-01-23 NOTE — Progress Notes (Signed)
PHARMACIST - PHYSICIAN COMMUNICATION  CONCERNING:  Enoxaparin (Lovenox) for DVT Prophylaxis    RECOMMENDATION: Patient was prescribed enoxaprin 30mg  q24 hours for VTE prophylaxis.   Filed Weights   01/19/20 1249  Weight: 46.3 kg (102 lb)    Body mass index is 19.27 kg/m.  Estimated Creatinine Clearance: 32.7 mL/min (by C-G formula based on SCr of 0.97 mg/dL).  Patient is candidate for enoxaparin 40mg  every 24 hours based on CrCl >63ml/min AND Weight > 45kg for women    DESCRIPTION: Pharmacy has adjusted enoxaparin dose per [facility] policy.  Patient is now receiving enoxaparin 40mg  every 24 hours.   Lu Duffel, PharmD, BCPS Clinical Pharmacist 01/23/2020 8:39 AM

## 2020-01-23 NOTE — Progress Notes (Addendum)
[facility] SURGICAL PROGRESS NOTE (cpt [phone])  Hospital Day(s): 4.   Interval History: Patient seen and examined, no acute events or new complaints overnight. Patient reports she continues to feel better, abdominal pain improved, no fever, chills, nausea, or emesis. Labs are reassuring this morning, mild hypokalemia to 3.4 o/w no electrolyte derangements. She was advanced to clear liquid diet yesterday without issues. Having bowel function. Mobilizing with PT.   Review of Systems:  Constitutional: denies fever, chills  HEENT: denies cough or congestion  Respiratory: denies any shortness of breath  Cardiovascular: denies chest pain or palpitations  Gastrointestinal: denied abdominal pain, denied N/V, or diarrhea/and bowel function as per interval history Genitourinary: denies burning with urination or urinary frequency  Vital signs in last 24 hours: [min-max] current  Temp:  [98 F (36.7 C)-98.1 F (36.7 C)] 98 F (36.7 C) (06/03 2244) Pulse Rate:  [64-69] 69 (06/03 2244) Resp:  [14-18] 14 (06/03 2244) BP: (133-134)/(58) 134/58 (06/03 2244) SpO2:  [98 %-99 %] 99 % (06/03 2244)     Height: 5\' 1"  (154.9 cm) Weight: 46.3 kg BMI (Calculated): 19.28   Intake/Output last 2 shifts:  06/03 0701 - 06/04 0700 In: 1070 [P.O.:120; I.V.:544.9; IV Piggyback:405.1] Out: -    Physical Exam:  Constitutional: alert, cooperative and no distress  HENT: normocephalic without obvious abnormality  Eyes: PERRL, EOM's grossly intact and symmetric Respiratory: breathing non-labored at rest  Cardiovascular: regular rate and sinus rhythm  Gastrointestinal: Soft, LLQ tenderness resolved, and non-distended, no rebound/guarding Musculoskeletal: no edema or wounds, motor and sensation grossly intact, NT      Labs:  CBC Latest Ref Rng & Units 01/22/2020 01/21/2020 01/20/2020  WBC 4.0 - 10.5 K/uL 7.1 7.4 8.9  Hemoglobin 12.0 - 15.0 g/dL 10.8(L) 10.1(L) 10.1(L)  Hematocrit 36.0 - 46.0 % 30.7(L)  29.3(L) 28.6(L)  Platelets 150 - 400 K/uL 137(L) 127(L) 129(L)   CMP Latest Ref Rng & Units 01/22/2020 01/21/2020 01/20/2020  Glucose 70 - 99 mg/dL 45(L) 56(L) 86  BUN 8 - 23 mg/dL 19 16 18   Creatinine 0.44 - 1.00 mg/dL 1.15(H) 0.99 0.96  Sodium 135 - 145 mmol/L 139 140 139  Potassium 3.5 - 5.1 mmol/L 4.2 3.7 3.7  Chloride 98 - 111 mmol/L 108 108 107  CO2 22 - 32 mmol/L 16(L) 20(L) 24  Calcium 8.9 - 10.3 mg/dL 8.7(L) 8.4(L) 8.4(L)  Total Protein 6.5 - 8.1 g/dL - - -  Total Bilirubin 0.3 - 1.2 mg/dL - - -  Alkaline Phos 38 - 126 U/L - - -  AST 15 - 41 U/L - - -  ALT 0 - 44 U/L - - -     Imaging studies:  CT Abdomen/Pelvis (01/22/2020) personally reviewed which shows improvement from admission scan still without large drainable collection, and radiologist report reviewed: IMPRESSION: 1. Persistent but improved sigmoid colonic wall thickening from recent exam. Improvement in pericolonic edema. Possible developing crescentic shaped intramural abscess in the sigmoid spanning 2.3 cm. No free air. 2. Small right pelvic fluid collection is unchanged spanning 15 mm greatest dimension. 3. Distended gallbladder with wall thickening versus pericholecystic fluid. Small stones on recent ultrasound are not well demonstrated by CT. 4. Unchanged retroperitoneal adenopathy from recent exam. 5. New small bilateral pleural effusions, right greater than left. 6. Additional stable chronic findings as described.   Assessment/Plan: (ICD-10's: K94.92) 85 y.o. female with clinically improving sigmoid diverticulitis with small abscess not amenable to drainage, complicated by multiple pertinent comorbidities.   - Advance to  full liquid diet today  - Continue IV ABx (Zosyn)               - wean IVF as diet advancing  - replete K+; monitor              - Monitor abdominal examination; on-going bowel function             - pain control prn; antiemetics prn              - Monitor renal function; morning  labs              - No emergent surgical intervention; understands role for elective sigmoid colectomy as outpatient once through this insult             - further management per primary service; we will follow      All of the above findings and recommendations were discussed with the patient, patient's family (son at bedside), and the medical team, and all of patient's and family's questions were answered to their expressed satisfaction.  -- [name], PA-C [facility] 01/23/2020, 6:45 AM [phone] M-F: 7am - 4pm  I saw and evaluated the patient.  I agree with the above documentation, exam, and plan, which I have edited where appropriate. [name]  9:39 AM

## 2020-01-24 LAB — BASIC METABOLIC PANEL
Anion gap: 7 (ref 5–15)
BUN: <5 mg/dL — ABNORMAL LOW (ref 8–23)
CO2: 22 mmol/L (ref 22–32)
Calcium: 8.3 mg/dL — ABNORMAL LOW (ref 8.9–10.3)
Chloride: 109 mmol/L (ref 98–111)
Creatinine, Ser: 0.9 mg/dL (ref 0.44–1.00)
GFR calc Af Amer: >60 mL/min (ref >60)
GFR calc non Af Amer: 60 mL/min — ABNORMAL LOW (ref >60)
Glucose, Bld: 105 mg/dL — ABNORMAL HIGH (ref 70–99)
Potassium: 3.7 mmol/L (ref 3.5–5.1)
Sodium: 138 mmol/L (ref 135–145)

## 2020-01-24 LAB — CBC
HCT: 28.8 % — ABNORMAL LOW (ref 36.0–46.0)
Hemoglobin: 10.3 g/dL — ABNORMAL LOW (ref 12.0–15.0)
MCH: 33.1 pg (ref 26.0–34.0)
MCHC: 35.8 g/dL (ref 30.0–36.0)
MCV: 92.6 fL (ref 80.0–100.0)
Platelets: 129 10*3/uL — ABNORMAL LOW (ref 150–400)
RBC: 3.11 MIL/uL — ABNORMAL LOW (ref 3.87–5.11)
RDW: 14.4 % (ref 11.5–15.5)
WBC: 5.4 10*3/uL (ref 4.0–10.5)
nRBC: 0 % (ref 0.0–0.2)

## 2020-01-24 LAB — PHOSPHORUS: Phosphorus: 2.6 mg/dL (ref 2.5–4.6)

## 2020-01-24 LAB — MAGNESIUM: Magnesium: 1.8 mg/dL (ref 1.7–2.4)

## 2020-01-24 MED ORDER — MAGNESIUM SULFATE IN D5W 1-5 GM/100ML-% IV SOLN
1.0000 g | Freq: Once | INTRAVENOUS | Status: AC
  Administered 2020-01-24: 1 g via INTRAVENOUS
  Filled 2020-01-24: qty 100

## 2020-01-24 MED ORDER — K PHOS MONO-SOD PHOS DI & MONO 155-852-130 MG PO TABS
250.0000 mg | ORAL_TABLET | ORAL | Status: DC
  Administered 2020-01-24: 250 mg via ORAL
  Filled 2020-01-24 (×2): qty 1

## 2020-01-24 MED ORDER — AMOXICILLIN-POT CLAVULANATE 400-57 MG/5ML PO SUSR: 875.0000 mg | Freq: Two times a day (BID) | ORAL | 0 refills | Status: AC

## 2020-01-24 MED ORDER — AMOXICILLIN-POT CLAVULANATE 400-57 MG/5ML PO SUSR
875.0000 mg | Freq: Two times a day (BID) | ORAL | Status: DC
  Filled 2020-01-24 (×2): qty 10.9

## 2020-01-24 NOTE — TOC Transition Note (Signed)
Transition of Care [facility]) - CM/SW Discharge Note   Patient Details  Name: Jeanne Pittman MRN: [id number] Date of Birth: [birth year]  [facility] CM/SW Contact:  Boris Sharper, LCSW Phone Number: 01/24/2020, 2:39 PM   Clinical Narrative:    Pt medically stable for discharge per MD. Pt will [facility] followed [facility], CSW notified [place] with [facility] of discharge.      Barriers to Discharge: Continued Medical Work up   Patient Goals and CMS Choice Patient states their goals for this hospitalization and ongoing recovery are:: I hope to be on a diet and feel better before I go home CMS Medicare.gov Compare Post Acute Care list provided to:: Patient Choice offered to / list presented to : Patient  Discharge Placement                       Discharge Plan and Services In-house Referral: Clinical Social Work   Post Acute Care Choice: Home Health                    HH Arranged: PT   Date [place]: 01/21/20 Time [place]: [phone] Representative spoke with at [place]: [place] ([place]) Interventions     Readmission Risk Interventions No flowsheet data found.

## 2020-01-24 NOTE — Discharge Summary (Signed)
Physician Discharge Summary  Jeanne Pittman UUV:[id number] DOB: 12-23-1934 DOA: 01/19/2020  PCP: [name], MD  Admit date: 01/19/2020 Discharge date: 01/24/2020  Admitted From: Home Disposition: Home   Recommendations for Outpatient Follow-up:  1. Follow up with PCP in 1-2 weeks 2. Follow-up with surgery 3. Please obtain BMP/CBC in one week 4. Please follow up on the following pending results:None  Home Health: Yes Equipment/Devices: Rolling walker Discharge Condition: Stable CODE STATUS: Full Diet recommendation: Heart Healthy   Brief/Interim Summary: 85 year old female with chronic combined systolic and diastolic CHF, history of STEMI with DES to LAD and RCA, hypertension, hypothyroidism, history of nonoxygen, s/p chemotherapy (in remission) presented with episode of syncope, lower abdominal cramping with episode of rapid A. fib in the ED, leukocytosis sigmoid diverticulitis and right colon abscess. Did had SIRS with no sepsis.  Surgery was consulted, they recommended empiric antibiotics and conservative management.  Patient was treated with Zosyn while in the hospital and discharged on Augmentin to complete a 2 weeks course according to surgery recommendations. Repeat CT abdomen with unchanged right colonic fluid and a new finding of a small crescentic intramural abscess with improvement in colonic edema.  IR consulted for drainage of right colonic fluid but suggested thatabscess was too small and not amenable to percutaneous drainage.  Patient remained n.p.o. for many days and then slowly started on diet.  She was able to tolerate soft diet on discharge.  She did developed some postprandial diarrhea which improved before discharge.  She did develop nonanion gap metabolic acidosis most likely secondary to remain n.p.o. and staying on normal saline.  UA was negative for ketones and acidosis resolved with change in IV fluid from normal saline to LR.  CT abdomen was also  positive for gallbladder distention and thickening.  Right upper quadrant ultrasound with cholelithiasis without any cholecystitis and a hypoechoic mass in the left lobe of liver suggest benign hepatic cyst.  Prior history of A. fib in the setting ofNSTEMI. Resolved on short dose of amiodarone. [id number] (age, female, CHF, history of MI, hypertension). A. fib possibly triggered by infection.  He was initially treated with diltiazem and then rate controlled on Lopressor.  Patient has an history of chronic systolic heart failure.  She did received IV fluid.  Repeat echo with EF of 35 to 40% and grade 1 diastolic dysfunction and mild pulmonary hypertension.  She will resume her home medications.  And follow-up with her cardiologist.  She appears euvolemic on discharge.  Patient has history of chronic thrombocytopenia and remained stable at her baseline.  Patient lives alone and independent at baseline.  Son was involved in her care.  She was discharged home on with home health services.  Patient will follow up with her PCP and surgery.  Discharge Diagnoses:  Principal Problem:   Sigmoid diverticulitis Active Problems:   Coronary artery disease   Chronic systolic heart failure (HCC)   Atrial fibrillation with [facility])  Discharge Instructions  Discharge Instructions    Diet - low sodium heart healthy   Complete by: As directed    Discharge instructions   Complete by: As directed    It was pleasure taking care of you. You are being given Augmentin for 9 more days. Please follow-up with your surgeon according to your scheduled appointment.   Increase activity slowly   Complete by: As directed      Allergies as of 01/24/2020      Reactions   Ace Inhibitors  Other reaction(s): Cough   Benadryl [diphenhydramine Hcl]    Other reaction(s): Unknown   Codeine    Other reaction(s): Hallucination   Diphenhydramine    Guaiacol    Hydrocodone    Other reaction(s): Hallucination    Cardizem [diltiazem] Rash   Guaifenesin & Derivatives Rash      Medication List    TAKE these medications   acetaminophen 650 MG CR tablet Commonly known as: TYLENOL Take 650 mg by mouth every 8 (eight) hours as needed for pain.   amoxicillin-clavulanate 400-57 MG/5ML suspension Commonly known as: AUGMENTIN Take 10.9 mLs (872 mg total) by mouth every 12 (twelve) hours for 9 days.   aspirin 81 MG tablet Take 81 mg by mouth every morning.   carvedilol 3.125 MG tablet Commonly known as: COREG TAKE 1 TABLET BY MOUTH TWICE DAILY WITH A MEAL What changed:   how much to take  how to take this  when to take this   fexofenadine 180 MG tablet Commonly known as: ALLEGRA Take 180 mg by mouth daily as needed for rhinitis.   furosemide 20 MG tablet Commonly known as: LASIX Take 1 tablet (20 mg total) by mouth daily as needed.   gabapentin 100 MG capsule Commonly known as: NEURONTIN Take 100 mg by mouth 3 (three) times daily.   latanoprost 0.005 % ophthalmic solution Commonly known as: XALATAN Place 1 drop into both eyes at bedtime.   LORazepam 0.5 MG tablet Commonly known as: ATIVAN Take 0.5 mg by mouth every 8 (eight) hours as needed for anxiety.   losartan 25 MG tablet Commonly known as: COZAAR TAKE 1/2 TABLET(12.5 MG) BY MOUTH DAILY AFTER BREAKFAST   pantoprazole 40 MG tablet Commonly known as: PROTONIX TAKE 1 TABLET(40 MG) BY MOUTH TWICE DAILY   rosuvastatin 10 MG tablet Commonly known as: CRESTOR TAKE 1 TABLET(10 MG) BY MOUTH EVERY OTHER DAY   timolol 0.5 % ophthalmic solution Commonly known as: TIMOPTIC Place 1 drop into both eyes 2 (two) times daily.   valACYclovir 1000 MG tablet Commonly known as: VALTREX Take 1,000 mg by mouth 2 (two) times daily.   Vitamin D-3 125 MCG (5000 UT) Tabs Take 2,000 Int'l Units by mouth every morning.      Follow-up Information    Lysle Pearl, Isami, DO Follow up in 3 week(s).   Specialty: Surgery Why: diverticulitis  f/u Contact information: [address] [place] [address] [phone]        [name], MD. Schedule an appointment as soon as possible for a visit.   Specialty: Internal Medicine Contact information: [facility] [place] [address] [phone]        Wellington Hampshire, MD .   Specialty: Cardiology Contact information: [address] [place] [address] [phone]          Allergies  Allergen Reactions  . Ace Inhibitors     Other reaction(s): Cough  . Benadryl [Diphenhydramine Hcl]     Other reaction(s): Unknown  . Codeine     Other reaction(s): Hallucination  . Diphenhydramine   . Guaiacol   . Hydrocodone     Other reaction(s): Hallucination  . Cardizem [Diltiazem] Rash  . Guaifenesin & Derivatives Rash    Consultations:  Surgery  IR  Procedures/Studies: CT Head Wo Contrast  Result Date: 01/19/2020 CLINICAL DATA:  Status post head trauma.  Abdominal pain. EXAM: CT HEAD WITHOUT CONTRAST CT CERVICAL SPINE WITHOUT CONTRAST TECHNIQUE: Multidetector CT imaging of the  head and cervical spine was performed following the standard protocol without intravenous contrast. Multiplanar CT image reconstructions of the cervical spine were also generated. COMPARISON:  None. FINDINGS: CT HEAD FINDINGS Brain: No evidence of acute infarction, hemorrhage, extra-axial collection, ventriculomegaly, or mass effect. Generalized cerebral atrophy. Periventricular white matter low attenuation likely secondary to microangiopathy. Vascular: Cerebrovascular atherosclerotic calcifications are noted. Skull: Negative for fracture or focal lesion. Sinuses/Orbits: Visualized portions of the orbits are unremarkable. Visualized portions of the paranasal sinuses are unremarkable. Visualized portions of the mastoid air cells are unremarkable. Other: None. CT CERVICAL SPINE FINDINGS Alignment: 2 mm anterolisthesis of C7 on T1. Skull base  and vertebrae: No acute fracture. No primary bone lesion or focal pathologic process. Soft tissues and spinal canal: No prevertebral fluid or swelling. No visible canal hematoma. Disc levels: Degenerative disease with severe disc height loss at C3-4, C6-7 and to lesser extent C5-6. bilateral facet arthropathy throughout the cervical spine. At C2-3 there is mild left foraminal stenosis. At C3-4 there is bilateral foraminal stenosis. At C4-5 there is severe right and moderate left foraminal stenosis. At C5-6 there is mild bilateral foraminal stenosis. At C6-7 there is bilateral severe foraminal stenosis. Upper chest: Lung apices are clear. Other: No fluid collection or hematoma. 8 mm hypodense thyroid mass in the isthmus of the thyroid unchanged compared with 12/06/2017. not clinically significant; no follow-up imaging recommended (ref: J Am Coll Radiol. 2015 Feb;12(2): 143-50). Bilateral carotid artery atherosclerosis. IMPRESSION: 1. No acute intracranial pathology. 2. No acute osseous injury of the cervical spine. 3. Cervical spine spondylosis as described above. Electronically Signed   By: Kathreen Devoid   On: 01/19/2020 14:54   CT Cervical Spine Wo Contrast  Result Date: 01/19/2020 CLINICAL DATA:  Status post head trauma.  Abdominal pain. EXAM: CT HEAD WITHOUT CONTRAST CT CERVICAL SPINE WITHOUT CONTRAST TECHNIQUE: Multidetector CT imaging of the head and cervical spine was performed following the standard protocol without intravenous contrast. Multiplanar CT image reconstructions of the cervical spine were also generated. COMPARISON:  None. FINDINGS: CT HEAD FINDINGS Brain: No evidence of acute infarction, hemorrhage, extra-axial collection, ventriculomegaly, or mass effect. Generalized cerebral atrophy. Periventricular white matter low attenuation likely secondary to microangiopathy. Vascular: Cerebrovascular atherosclerotic calcifications are noted. Skull: Negative for fracture or focal lesion. Sinuses/Orbits:  Visualized portions of the orbits are unremarkable. Visualized portions of the paranasal sinuses are unremarkable. Visualized portions of the mastoid air cells are unremarkable. Other: None. CT CERVICAL SPINE FINDINGS Alignment: 2 mm anterolisthesis of C7 on T1. Skull base and vertebrae: No acute fracture. No primary bone lesion or focal pathologic process. Soft tissues and spinal canal: No prevertebral fluid or swelling. No visible canal hematoma. Disc levels: Degenerative disease with severe disc height loss at C3-4, C6-7 and to lesser extent C5-6. bilateral facet arthropathy throughout the cervical spine. At C2-3 there is mild left foraminal stenosis. At C3-4 there is bilateral foraminal stenosis. At C4-5 there is severe right and moderate left foraminal stenosis. At C5-6 there is mild bilateral foraminal stenosis. At C6-7 there is bilateral severe foraminal stenosis. Upper chest: Lung apices are clear. Other: No fluid collection or hematoma. 8 mm hypodense thyroid mass in the isthmus of the thyroid unchanged compared with 12/06/2017. not clinically significant; no follow-up imaging recommended (ref: J Am Coll Radiol. 2015 Feb;12(2): 143-50). Bilateral carotid artery atherosclerosis. IMPRESSION: 1. No acute intracranial pathology. 2. No acute osseous injury of the cervical spine. 3. Cervical spine spondylosis as described above. Electronically Signed   By: Elbert Ewings  Patel   On: 01/19/2020 14:54   CT ABDOMEN PELVIS W CONTRAST  Result Date: 01/22/2020 CLINICAL DATA:  Follow-up diverticulitis with abscess. Small abscess not amenable to drainage, improved abdominal pain with stable white blood cell count. EXAM: CT ABDOMEN AND PELVIS WITH CONTRAST TECHNIQUE: Multidetector CT imaging of the abdomen and pelvis was performed using the standard protocol following bolus administration of intravenous contrast. CONTRAST:  35mL OMNIPAQUE IOHEXOL 300 MG/ML  SOLN COMPARISON:  CT 01/19/2020, abdominal ultrasound 01/20/2020. PET  CT 02/05/2019 reviewed. FINDINGS: Lower chest: Small bilateral pleural effusions, right greater than left, new from prior. Adjacent compressive atelectasis. Coronary artery calcifications. Hepatobiliary: Low-density lesion in the medial left hepatic lobe measuring 15 mm, not significantly changed from recent exam. Additional simple cysts scattered throughout the left and right lobes of the liver. Gallbladder is distended. Phrygian cap distally. Wall thickening versus pericholecystic fluid, for example series 2, image 35. Small stones on recent ultrasound are not well demonstrated. No common bile duct dilatation. Pancreas: No ductal dilatation or inflammation. Spleen: Small low-density lesions in the superior spleen, unchanged. No new abnormality. Adrenals/Urinary Tract: Adrenal thickening without dominant nodule. No hydronephrosis or perinephric edema. Homogeneous renal enhancement with symmetric excretion on delayed phase imaging. Small renal cysts again seen. Urinary bladder is physiologically distended without wall thickening. Stomach/Bowel: Persistent but improved sigmoid colonic wall thickening from recent exam. Improvement in pericolonic edema. Stable small fluid density structure in the right pelvis spanning 1.5 cm, series 2, image 61. Possible developing crescentic shaped intramural abscess in the sigmoid spanning 2.3 cm, for example series 2, image 63 and series 5, image 47. Multiple additional noninflamed diverticula seen in the distal colon. There is no free air. Normal small bowel without obstruction, administered enteric contrast is seen in the colon. Decompressed stomach. Prominent periampullary duodenal diverticulum again seen. Appendix not definitively visualized. Vascular/Lymphatic: Abdominal aortic atherosclerosis without aneurysm. Patent portal vein. Retroperitoneal adenopathy again seen. For example 16 mm left periaortic node series 2, image 32, unchanged. Right IV seen node situated anterior to  the psoas muscle measures 13 mm, series 3, image 38, also unchanged. There are prominent left external iliac nodes. No new adenopathy. Reproductive: Status post hysterectomy. No adnexal masses. Other: No free air. No ascites or free fluid. Improvement in inflammatory stranding about the sigmoid colon. Musculoskeletal: There are no acute or suspicious osseous abnormalities. Degenerative change throughout spine. IMPRESSION: 1. Persistent but improved sigmoid colonic wall thickening from recent exam. Improvement in pericolonic edema. Possible developing crescentic shaped intramural abscess in the sigmoid spanning 2.3 cm. No free air. 2. Small right pelvic fluid collection is unchanged spanning 15 mm greatest dimension. 3. Distended gallbladder with wall thickening versus pericholecystic fluid. Small stones on recent ultrasound are not well demonstrated by CT. 4. Unchanged retroperitoneal adenopathy from recent exam. 5. New small bilateral pleural effusions, right greater than left. 6. Additional stable chronic findings as described. Aortic Atherosclerosis (ICD10-I70.0). Electronically Signed   By: Keith Rake M.D.   On: 01/22/2020 13:01   CT Abdomen Pelvis W Contrast  Result Date: 01/19/2020 CLINICAL DATA:  Abdominal pain. EXAM: CT ABDOMEN AND PELVIS WITH CONTRAST TECHNIQUE: Multidetector CT imaging of the abdomen and pelvis was performed using the standard protocol following bolus administration of intravenous contrast. CONTRAST:  66mL OMNIPAQUE IOHEXOL 300 MG/ML  SOLN COMPARISON:  PET-CT 12/06/2017 FINDINGS: Lower chest: Lung bases are clear. Hepatobiliary: Low-density lesion in the medial hepatic lobe is not simple fluid attenuation however lesion was larger on comparison PET-CT scan and non  metabolic. Gallbladder is moderately distended at 3.4 cm. There is mild thickening gallbladder wall. Small pericholecystic fluid noted on coronal image 26/5. No radiodense gallstones are evident. Common bile duct normal  caliber. Pancreas: Large periampullary duodenum diverticulum measuring 2.4 cm. No pancreatic duct dilatation or inflammation. Spleen: Normal spleen Adrenals/urinary tract: Adrenal glands normal. No renal obstruction. Benign-appearing renal cysts noted. Stomach/Bowel: Stomach, duodenum and small-bowel appear normal. Cecum normal. Appendix not identified. There is thickening through the sigmoid region the colon. The bowel contour poorly defined (image 63/2. This small fluid collection adjacent to the RIGHT operator space measuring 1.5 cm. There is mucosal enhancement through the bowel seen on coronal image 48/5. Vascular/Lymphatic: Abdominal aorta is normal caliber with atherosclerotic calcification. There is no retroperitoneal or periportal lymphadenopathy. No pelvic lymphadenopathy. Several enlarged LEFT periaortic lymph nodes noted. For example 16 mm node image 32/2. Lymph node between the IVC in the RIGHT psoas muscle measures 1.3 cm. No pelvic adenopathy. These lymph nodes are similar comparison PET-CT scan with mild metabolic activity. Reproductive: Post hysterectomy.  No adnexal abnormality. Other: No free fluid. Musculoskeletal: No aggressive osseous lesion. IMPRESSION: 1. Mild gallbladder distension with gallbladder wall thickening. No gallstones identified by CT. Recommend clinical correlation for acute cholecystitis. 2. No evidence of pancreatitis. Periampullary duodenum diverticulum noted. 3. Inflammation and poor bowel wall distinction of the sigmoid colon. Concern for diverticulitis of the sigmoid colon. Recommend follow-up exam to exclude abscess formation. 4. Retroperitoneal adenopathy similar comparison PET-CT scan. Findings consistent low-grade lymphoma. Electronically Signed   By: Suzy Bouchard M.D.   On: 01/19/2020 15:10   ECHOCARDIOGRAM COMPLETE  Result Date: 01/20/2020    ECHOCARDIOGRAM REPORT   Patient Name:   Jeanne Pittman Date of Exam: 01/20/2020 Medical Rec #:  [id number]       Height:        61.0 in Accession #:    [id number]      Weight:       102.0 lb Date of Birth:  1935-06-05        BSA:          1.419 m Patient Age:    85 years        BP:           122/74 mmHg Patient Gender: F               HR:           72 bpm. Exam Location:  [name] Procedure: 2D Echo, Color Doppler and Cardiac Doppler Indications:     Atrial fibrillation 427.31  History:         Patient has prior history of Echocardiogram examinations, most                  recent 08/20/2019. CHF and Cardiomyopathy, Previous Myocardial                  Infarction, Arrythmias:Atrial Fibrillation; Risk                  Factors:Hypertension.  Sonographer:     Sherrie Sport RDCS (AE) Referring Phys:  [id number] Louellen Molder Diagnosing Phys: Nelva Bush MD IMPRESSIONS  1. Left ventricular ejection fraction, by estimation, is 35 to 40%. The left ventricle has moderately decreased function. The left ventricle demonstrates regional wall motion abnormalities (see scoring diagram/findings for description). There is moderate left ventricular hypertrophy. Left ventricular diastolic parameters are consistent with Grade I diastolic dysfunction (impaired relaxation). There is severe hypokinesis of the left  ventricular, mid-apical anteroseptal wall and apical segment.  2. There is at least mild pulmonary hypertension (PASP 35 mmHg plus central venous pressure). Right ventricular systolic function is normal. The right ventricular size is normal.  3. The mitral valve is grossly normal. Mild mitral valve regurgitation. No evidence of mitral stenosis.  4. Tricuspid valve regurgitation is moderate to severe.  5. The aortic valve is tricuspid. Aortic valve regurgitation is mild. Mild aortic valve sclerosis is present, with no evidence of aortic valve stenosis. FINDINGS  Left Ventricle: Left ventricular ejection fraction, by estimation, is 35 to 40%. The left ventricle has moderately decreased function. The left ventricle demonstrates regional wall motion  abnormalities. Severe hypokinesis of the left ventricular, mid-apical anteroseptal wall and apical segment. The left ventricular internal cavity size was normal in size. There is moderate left ventricular hypertrophy. Left ventricular diastolic parameters are consistent with Grade I diastolic dysfunction (impaired relaxation). Right Ventricle: There is at least mild pulmonary hypertension (PASP 35 mmHg plus central venous pressure). The right ventricular size is normal. No increase in right ventricular wall thickness. Right ventricular systolic function is normal. Left Atrium: Left atrial size was normal in size. Right Atrium: Right atrial size was normal in size. Pericardium: The pericardium was not well visualized. Mitral Valve: The mitral valve is grossly normal. Mild mitral valve regurgitation. No evidence of mitral valve stenosis. Tricuspid Valve: The tricuspid valve is not well visualized. Tricuspid valve regurgitation is moderate to severe. Aortic Valve: The aortic valve is tricuspid. . There is mild thickening of the aortic valve. Aortic valve regurgitation is mild. Mild aortic valve sclerosis is present, with no evidence of aortic valve stenosis. Mild aortic valve annular calcification. There is mild thickening of the aortic valve. Aortic valve mean gradient measures 5.0 mmHg. Aortic valve peak gradient measures 10.0 mmHg. Aortic valve area, by VTI measures 2.09 cm. Pulmonic Valve: The pulmonic valve was not well visualized. Pulmonic valve regurgitation is not visualized. Aorta: The aortic root is normal in size and structure. Pulmonary Artery: The pulmonary artery is not well seen. Venous: The inferior vena cava was not well visualized. IAS/Shunts: The interatrial septum was not well visualized.  LEFT VENTRICLE PLAX 2D LVIDd:         3.70 cm     Diastology LVIDs:         2.45 cm     LV e' lateral:   6.09 cm/s LV PW:         1.26 cm     LV E/e' lateral: 13.0 LV IVS:        0.93 cm     LV e' medial:    5.87  cm/s LVOT diam:     2.00 cm     LV E/e' medial:  13.5 LV SV:         66 LV SV Index:   47 LVOT Area:     3.14 cm  LV Volumes (MOD) LV vol d, MOD A2C: 72.6 ml LV vol d, MOD A4C: 72.9 ml LV vol s, MOD A2C: 48.4 ml LV vol s, MOD A4C: 42.1 ml LV SV MOD A2C:     24.2 ml LV SV MOD A4C:     72.9 ml LV SV MOD BP:      26.8 ml RIGHT VENTRICLE RV Basal diam:  3.28 cm RV S prime:     14.00 cm/s TAPSE (M-mode): 4.2 cm LEFT ATRIUM             Index  RIGHT ATRIUM           Index LA diam:        3.10 cm 2.19 cm/m  RA Area:     11.70 cm LA Vol (A2C):   41.1 ml 28.97 ml/m RA Volume:   26.20 ml  18.47 ml/m LA Vol (A4C):   33.7 ml 23.75 ml/m LA Biplane Vol: 38.5 ml 27.14 ml/m  AORTIC VALVE                    PULMONIC VALVE AV Area (Vmax):    2.25 cm     PV Vmax:        0.69 m/s AV Area (Vmean):   2.32 cm     PV Peak grad:   1.9 mmHg AV Area (VTI):     2.09 cm     RVOT Peak grad: 1 mmHg AV Vmax:           158.00 cm/s AV Vmean:          103.800 cm/s AV VTI:            0.316 m AV Peak Grad:      10.0 mmHg AV Mean Grad:      5.0 mmHg LVOT Vmax:         113.00 cm/s LVOT Vmean:        76.500 cm/s LVOT VTI:          0.210 m LVOT/AV VTI ratio: 0.67  AORTA Ao Root diam: 3.00 cm MITRAL VALVE                TRICUSPID VALVE MV Area (PHT): 4.83 cm     TR Peak grad:   34.1 mmHg MV Decel Time: 157 msec     TR Vmax:        292.00 cm/s MV E velocity: 79.30 cm/s MV A velocity: 120.00 cm/s  SHUNTS MV E/A ratio:  0.66         Systemic VTI:  0.21 m                             Systemic Diam: 2.00 cm Nelva Bush MD Electronically signed by Nelva Bush MD Signature Date/Time: 01/20/2020/6:23:50 PM    Final    US Abdomen Limited RUQ  Result Date: 01/20/2020 CLINICAL DATA:  Abdominal pain. EXAM: ULTRASOUND ABDOMEN LIMITED RIGHT UPPER QUADRANT COMPARISON:  CT scan of the abdomen dated 01/19/2020, CT scan of the abdomen dated 02/23/2012 and PET CTs dated 02/04/2014 and 12/06/2017 FINDINGS: Gallbladder: Multiple small stones. No  thickening of the gallbladder wall. Negative sonographic Murphy's sign. Tiny amount pericholecystic fluid. Common bile duct: Diameter: 5.4 mm,. Liver: There is a solid slightly hypoechoic 1.7 x 1.4 x 1 3 cm mass in the left lobe of the liver adjacent to the falciform ligament which correlates with the mass seen on the prior CT scan. There is also a 7 mm simple cyst in the left lobe of the liver. Liver parenchyma is otherwise normal. Portal vein is patent on color Doppler imaging with normal direction of blood flow towards the liver. Other: None. IMPRESSION: 1. Cholelithiasis. 2. Tiny amount of pericholecystic fluid, nonspecific. 3. Hypoechoic mass in the left lobe of the liver. This had the appearance of a simple cyst on 02/23/2012 and appears smaller than on that exam. It is smaller than on the prior PET-CT of 12/06/2017 and showed no hypermetabolic activity on that scan. It  probably represents a complex but benign hepatic cyst. Electronically Signed   By: Lorriane Shire M.D.   On: 01/20/2020 08:31     Subjective: Patient was feeling better when seen today.  She was ready to go home.  No new complaints.  Discharge Exam: Vitals:   01/23/20 2344 01/24/20 0952  BP: (!) 119/47 (!) 147/66  Pulse: 65 64  Resp: 18 17  Temp: 98 F (36.7 C) 98.1 F (36.7 C)  SpO2: 96% 98%   Vitals:   01/23/20 0819 01/23/20 1618 01/23/20 2344 01/24/20 0952  BP: (!) 122/55 (!) 147/61 (!) 119/47 (!) 147/66  Pulse: 63 64 65 64  Resp: 17 18 18 17   Temp: 97.9 F (36.6 C) 98.2 F (36.8 C) 98 F (36.7 C) 98.1 F (36.7 C)  TempSrc: Oral Oral  Oral  SpO2: 99% 100% 96% 98%  Weight:      Height:        General: Pt is alert, awake, not in acute distress Cardiovascular: RRR, S1/S2 +, no rubs, no gallops Respiratory: CTA bilaterally, no wheezing, no rhonchi Abdominal: Soft, NT, ND, bowel sounds + Extremities: no edema, no cyanosis   The results of significant diagnostics from this hospitalization (including  imaging, microbiology, ancillary and laboratory) are listed below for reference.    Microbiology: Recent Results (from the past 240 hour(s))  SARS Coronavirus 2 by RT PCR (hospital order, performed in [facility] hospital lab) Nasopharyngeal Nasopharyngeal Swab     Status: None   Collection Time: 01/19/20  3:56 PM   Specimen: Nasopharyngeal Swab  Result Value Ref Range Status   SARS Coronavirus 2 NEGATIVE NEGATIVE Final    Comment: (NOTE) SARS-CoV-2 target nucleic acids are NOT DETECTED. The SARS-CoV-2 RNA is generally detectable in upper and lower respiratory specimens during the acute phase of infection. The lowest concentration of SARS-CoV-2 viral copies this assay can detect is 250 copies / mL. A negative result does not preclude SARS-CoV-2 infection and should not be used as the sole basis for treatment or other patient management decisions.  A negative result may occur with improper specimen collection / handling, submission of specimen other than nasopharyngeal swab, presence of viral mutation(s) within the areas targeted by this assay, and inadequate number of viral copies (<250 copies / mL). A negative result must be combined with clinical observations, patient history, and epidemiological information. Fact Sheet for Patients:   StrictlyIdeas.no Fact Sheet for Healthcare Providers: BankingDealers.co.za This test is not yet approved or cleared  by the [place] FDA and has been authorized for detection and/or diagnosis of SARS-CoV-2 by FDA under an Emergency Use Authorization (EUA).  This EUA will remain in effect (meaning this test can be used) for the duration of the COVID-19 declaration under Section 564(b)(1) of the Act, 21 U.S.C. section 360bbb-3(b)(1), unless the authorization is terminated or revoked sooner. Performed at [facility], [address] [place], [place] [address]      Labs: BNP (last 3  results) No results for input(s): BNP in the last 8760 hours. Basic Metabolic Panel: Recent Labs  Lab 01/19/20 1309 01/19/20 1309 01/20/20 0459 01/20/20 0514 01/21/20 0449 01/22/20 0428 01/23/20 0543 01/24/20 0507  NA 134*   < > 139  --  140 139 139 138  K 3.9   < > 3.7  --  3.7 4.2 3.4* 3.7  CL 104   < > 107  --  108 108 110 109  CO2 21*   < > 24  --  20* 16* 22 22  GLUCOSE 105*   < > 86  --  56* 45* 120* 105*  BUN 20   < > 18  --  16 19 9  <5*  CREATININE 0.69   < > 0.96  --  0.99 1.15* 0.97 0.90  CALCIUM 8.2*   < > 8.4*  --  8.4* 8.7* 8.5* 8.3*  MG 1.9  --   --  2.1  --  2.1 1.8 1.8  PHOS  --   --   --   --   --  3.7 2.7 2.6   < > = values in this interval not displayed.   Liver Function Tests: Recent Labs  Lab 01/19/20 1309  AST 13*  ALT 10  ALKPHOS 42  BILITOT 1.7*  PROT 6.3*  ALBUMIN 3.2*   Recent Labs  Lab 01/19/20 1309  LIPASE 30   No results for input(s): AMMONIA in the last 168 hours. CBC: Recent Labs  Lab 01/19/20 1309 01/20/20 0459 01/21/20 0449 01/22/20 0428 01/24/20 0507  WBC 15.2* 8.9 7.4 7.1 5.4  NEUTROABS 9.4*  --   --   --   --   HGB 12.3 10.1* 10.1* 10.8* 10.3*  HCT 35.5* 28.6* 29.3* 30.7* 28.8*  MCV 95.7 95.0 94.5 96.2 92.6  PLT 88* 129* 127* 137* 129*   Cardiac Enzymes: No results for input(s): CKTOTAL, CKMB, CKMBINDEX, TROPONINI in the last 168 hours. BNP: Invalid input(s): POCBNP CBG: Recent Labs  Lab 01/22/20 0736  GLUCAP 194*   D-Dimer No results for input(s): DDIMER in the last 72 hours. Hgb A1c No results for input(s): HGBA1C in the last 72 hours. Lipid Profile No results for input(s): CHOL, HDL, LDLCALC, TRIG, CHOLHDL, LDLDIRECT in the last 72 hours. Thyroid function studies No results for input(s): TSH, T4TOTAL, T3FREE, THYROIDAB in the last 72 hours.  Invalid input(s): FREET3 Anemia work up No results for input(s): VITAMINB12, FOLATE, FERRITIN, TIBC, IRON, RETICCTPCT in the last 72 hours. Urinalysis     Component Value Date/Time   COLORURINE YELLOW (A) 01/22/2020 1832   APPEARANCEUR CLEAR (A) 01/22/2020 1832   APPEARANCEUR Clear 10/10/2016 1340   LABSPEC >1.046 (H) 01/22/2020 1832   PHURINE 5.0 01/22/2020 1832   GLUCOSEU NEGATIVE 01/22/2020 1832   HGBUR NEGATIVE 01/22/2020 1832   BILIRUBINUR NEGATIVE 01/22/2020 1832   BILIRUBINUR Negative 10/10/2016 1340   KETONESUR NEGATIVE 01/22/2020 1832   PROTEINUR NEGATIVE 01/22/2020 1832   NITRITE NEGATIVE 01/22/2020 1832   LEUKOCYTESUR TRACE (A) 01/22/2020 1832   Sepsis Labs Invalid input(s): PROCALCITONIN,  WBC,  LACTICIDVEN Microbiology Recent Results (from the past 240 hour(s))  SARS Coronavirus 2 by RT PCR (hospital order, performed in [facility] hospital lab) Nasopharyngeal Nasopharyngeal Swab     Status: None   Collection Time: 01/19/20  3:56 PM   Specimen: Nasopharyngeal Swab  Result Value Ref Range Status   SARS Coronavirus 2 NEGATIVE NEGATIVE Final    Comment: (NOTE) SARS-CoV-2 target nucleic acids are NOT DETECTED. The SARS-CoV-2 RNA is generally detectable in upper and lower respiratory specimens during the acute phase of infection. The lowest concentration of SARS-CoV-2 viral copies this assay can detect is 250 copies / mL. A negative result does not preclude SARS-CoV-2 infection and should not be used as the sole basis for treatment or other patient management decisions.  A negative result may occur with improper specimen collection / handling, submission of specimen other than nasopharyngeal swab, presence of viral mutation(s) within the areas targeted by this assay, and  inadequate number of viral copies (<250 copies / mL). A negative result must be combined with clinical observations, patient history, and epidemiological information. Fact Sheet for Patients:   StrictlyIdeas.no Fact Sheet for Healthcare Providers: BankingDealers.co.za This test is not yet approved or  cleared  by the [place] FDA and has been authorized for detection and/or diagnosis of SARS-CoV-2 by FDA under an Emergency Use Authorization (EUA).  This EUA will remain in effect (meaning this test can be used) for the duration of the COVID-19 declaration under Section 564(b)(1) of the Act, 21 U.S.C. section 360bbb-3(b)(1), unless the authorization is terminated or revoked sooner. Performed at [facility], [address] [place],  [address]     Time coordinating discharge: Over 30 minutes  SIGNED:  Lorella Nimrod, MD  [facility] 01/24/2020, 2:14 PM  If 7PM-7AM, please contact night-coverage [email or web address]  This record has been created using Systems analyst. Errors have been sought and corrected,but may not always be located. Such creation errors do not reflect on the standard of care.

## 2020-01-24 NOTE — Plan of Care (Signed)

## 2020-01-24 NOTE — Consult Note (Signed)
PHARMACY CONSULT NOTE - FOLLOW UP  Pharmacy Consult for Electrolyte Monitoring and Replacement   Recent Labs: Potassium (mmol/L)  Date Value  01/24/2020 3.7  09/07/2014 4.0   Magnesium (mg/dL)  Date Value  01/24/2020 1.8  11/01/2011 2.1   Calcium (mg/dL)  Date Value  01/24/2020 8.3 (L)   Calcium, Total (mg/dL)  Date Value  09/07/2014 8.8   Albumin (g/dL)  Date Value  01/19/2020 3.2 (L)  09/02/2015 4.2  09/07/2014 3.8   Phosphorus (mg/dL)  Date Value  01/24/2020 2.6   Sodium (mmol/L)  Date Value  01/24/2020 138  09/07/2014 141    Assessment: Jeanne Dakin. Pittman is 85yo female admitted on 01/19/2020 with abdominal pain and syncope. Pt has not been eating well and is at risk for refeeding syndrome. Pharmacy has been consulted to manage electrolytes.   Goal of Therapy:  Electrolytes WNL, K ~4, Mg ~2  Plan:  Mag: 1.8, K: 3.7, Phos ~3   Will order Magnesium 1g IV x 1 dose and KPhos neutral tabs every 4 hours x 2 doses.   BMP with Mg have been ordered for the morning.  Will continue to monitor and replace as indicated.   Pernell Dupre, PharmD, BCPS Clinical Pharmacist 01/24/2020 8:30 AM

## 2020-01-24 NOTE — Progress Notes (Signed)
Patient discharging home. Son at bedside to transport patient home. Discharge instructions given to patient, verbalized understanding.

## 2020-01-24 NOTE — Progress Notes (Signed)
Subjective:  CC: Jeanne Pittman is a 85 y.o. female  Hospital stay day 5,   diverticulitis  HPI: No issues overnight.  Tolerating full liquid diet.  No pain.  Solid BMs  ROS:  General: Denies weight loss, weight gain, fatigue, fevers, chills, and night sweats. Heart: Denies chest pain, palpitations, racing heart, irregular heartbeat, leg pain or swelling, and decreased activity tolerance. Respiratory: Denies breathing difficulty, shortness of breath, wheezing, cough, and sputum. GI: Denies change in appetite, heartburn, nausea, vomiting, constipation, diarrhea, and blood in stool. GU: Denies difficulty urinating, pain with urinating, urgency, frequency, blood in urine.   Objective:   Temp:  [98 F (36.7 C)-98.2 F (36.8 C)] 98 F (36.7 C) (06/04 2344) Pulse Rate:  [64-65] 65 (06/04 2344) Resp:  [18] 18 (06/04 2344) BP: (119-147)/(47-61) 119/47 (06/04 2344) SpO2:  [96 %-100 %] 96 % (06/04 2344)     Height: 5\' 1"  (154.9 cm) Weight: 46.3 kg BMI (Calculated): 19.28   Intake/Output this shift:   Intake/Output Summary (Last 24 hours) at 01/24/2020 0924 Last data filed at 01/24/2020 0746 Gross per 24 hour  Intake 1750.05 ml  Output 4 ml  Net 1746.05 ml    Constitutional :  alert, cooperative, appears stated age and no distress  Respiratory:  clear to auscultation bilaterally  Cardiovascular:  regular rate and rhythm  Gastrointestinal: soft, non-tender; bowel sounds normal; no masses,  no organomegaly.   Skin: Cool and moist.   Psychiatric: Normal affect, non-agitated, not confused       LABS:  CMP Latest Ref Rng & Units 01/24/2020 01/23/2020 01/22/2020  Glucose 70 - 99 mg/dL 105(H) 120(H) 45(L)  BUN 8 - 23 mg/dL <5(L) 9 19  Creatinine 0.44 - 1.00 mg/dL 0.90 0.97 1.15(H)  Sodium 135 - 145 mmol/L 138 139 139  Potassium 3.5 - 5.1 mmol/L 3.7 3.4(L) 4.2  Chloride 98 - 111 mmol/L 109 110 108  CO2 22 - 32 mmol/L 22 22 16(L)  Calcium 8.9 - 10.3 mg/dL 8.3(L) 8.5(L) 8.7(L)  Total Protein  6.5 - 8.1 g/dL - - -  Total Bilirubin 0.3 - 1.2 mg/dL - - -  Alkaline Phos 38 - 126 U/L - - -  AST 15 - 41 U/L - - -  ALT 0 - 44 U/L - - -   CBC Latest Ref Rng & Units 01/24/2020 01/22/2020 01/21/2020  WBC 4.0 - 10.5 K/uL 5.4 7.1 7.4  Hemoglobin 12.0 - 15.0 g/dL 10.3(L) 10.8(L) 10.1(L)  Hematocrit 36.0 - 46.0 % 28.8(L) 30.7(L) 29.3(L)  Platelets 150 - 400 K/uL 129(L) 137(L) 127(L)    RADS: n/a Assessment:   Diverticulitis.  Resolving.  Switch to augmentin, liquid form per pt request. Ok to d/c once tolerating reg diet and F/u as outpt for colonoscopy discussion.  Plan discussed with primary and she is in agreement

## 2020-01-29 ENCOUNTER — Telehealth: Payer: Self-pay | Admitting: Cardiovascular Disease

## 2020-01-29 NOTE — Telephone Encounter (Signed)
She is at low risk for colonoscopy. However, she should continue aspirin without interruption given previous LAD and RCA stents.

## 2020-01-29 NOTE — Telephone Encounter (Signed)
° °  Primary Cardiologist: Kathlyn Sacramento, MD  Chart reviewed as part of pre-operative protocol coverage. Given past medical history and time since last visit, based on ACC/AHA guidelines, Jeanne Pittman would be at acceptable risk for the planned procedure without further cardiovascular testing.   Due to her previous left anterior descending and right coronary artery stents she should continue her aspirin without interruption.  I will route this recommendation to the requesting party via Epic fax function and remove from pre-op pool.  Please call with questions.  Jossie Ng. Jahfari Ambers NP-C    01/29/2020, 11:35 AM [place] [address] Office [phone] Fax [phone]

## 2020-01-29 NOTE — Telephone Encounter (Signed)
   [facility] Pre-operative Risk Assessment    HEARTCARE STAFF: - Please ensure there is not already an duplicate clearance open for this procedure. - Under Visit Info/Reason for Call, type in Other and utilize the format Clearance MM/DD/YY or Clearance TBD. Do not use dashes or single digits. - If request is for dental extraction, please clarify the # of teeth to be extracted.  Request for surgical clearance:  1. What type of surgery is being performed? COLONOSCOPY  2. When is this surgery scheduled? TBD  3. What type of clearance is required (medical clearance vs. Pharmacy clearance to hold med vs. Both)? BOTH  4. Are there any medications that need to be held prior to surgery and how long? ASPIRIN OR  ANY OTHER   BLOOD THINNERS  Practice name and name of physician performing surgery?  [facility] DR[facility] SAKAI What is the office phone number?  [phone]   7.   What is the office fax number? [phone]  8.   Anesthesia type (None, local, MAC, general) ? NOT LISTED  Jeanne Pittman 01/29/2020, 8:12 AM  _________________________________________________________________   (provider comments below)

## 2020-02-24 ENCOUNTER — Other Ambulatory Visit: Payer: Self-pay

## 2020-02-24 ENCOUNTER — Ambulatory Visit
Admission: RE | Admit: 2020-02-24 | Discharge: 2020-02-24 | Disposition: A | Discharge status: Home / Self Care | Payer: Medicare Other | Source: Ambulatory Visit | Attending: Internal Medicine | Admitting: Internal Medicine

## 2020-02-24 DIAGNOSIS — C8304 Small cell B-cell lymphoma, lymph nodes of axilla and upper limb: Secondary | ICD-10-CM

## 2020-02-24 DIAGNOSIS — C8384 Other non-follicular lymphoma, lymph nodes of axilla and upper limb: Secondary | ICD-10-CM | POA: Diagnosis present

## 2020-02-24 LAB — GLUCOSE, CAPILLARY: Glucose-Capillary: 86 mg/dL (ref 70–99)

## 2020-02-24 MED ORDER — FLUDEOXYGLUCOSE F - 18 (FDG) INJECTION
5.3000 | Freq: Once | INTRAVENOUS | Status: AC | PRN
  Administered 2020-02-24: 5.82 via INTRAVENOUS

## 2020-02-27 ENCOUNTER — Inpatient Hospital Stay (HOSPITAL_BASED_OUTPATIENT_CLINIC_OR_DEPARTMENT_OTHER): Payer: Medicare Other | Admitting: Internal Medicine

## 2020-02-27 ENCOUNTER — Inpatient Hospital Stay: Payer: Medicare Other | Attending: Internal Medicine

## 2020-02-27 ENCOUNTER — Encounter: Payer: Self-pay | Admitting: Internal Medicine

## 2020-02-27 ENCOUNTER — Other Ambulatory Visit: Payer: Self-pay

## 2020-02-27 DIAGNOSIS — Z806 Family history of leukemia: Secondary | ICD-10-CM | POA: Diagnosis not present

## 2020-02-27 DIAGNOSIS — I252 Old myocardial infarction: Secondary | ICD-10-CM | POA: Diagnosis not present

## 2020-02-27 DIAGNOSIS — Z885 Allergy status to narcotic agent status: Secondary | ICD-10-CM | POA: Diagnosis not present

## 2020-02-27 DIAGNOSIS — D696 Thrombocytopenia, unspecified: Secondary | ICD-10-CM | POA: Diagnosis not present

## 2020-02-27 DIAGNOSIS — M1990 Unspecified osteoarthritis, unspecified site: Secondary | ICD-10-CM | POA: Insufficient documentation

## 2020-02-27 DIAGNOSIS — C8284 Other types of follicular lymphoma, lymph nodes of axilla and upper limb: Secondary | ICD-10-CM | POA: Insufficient documentation

## 2020-02-27 DIAGNOSIS — Z888 Allergy status to other drugs, medicaments and biological substances status: Secondary | ICD-10-CM | POA: Insufficient documentation

## 2020-02-27 DIAGNOSIS — R109 Unspecified abdominal pain: Secondary | ICD-10-CM | POA: Insufficient documentation

## 2020-02-27 DIAGNOSIS — M2550 Pain in unspecified joint: Secondary | ICD-10-CM | POA: Diagnosis not present

## 2020-02-27 DIAGNOSIS — I4891 Unspecified atrial fibrillation: Secondary | ICD-10-CM | POA: Insufficient documentation

## 2020-02-27 DIAGNOSIS — Z8719 Personal history of other diseases of the digestive system: Secondary | ICD-10-CM | POA: Insufficient documentation

## 2020-02-27 DIAGNOSIS — Z87891 Personal history of nicotine dependence: Secondary | ICD-10-CM | POA: Insufficient documentation

## 2020-02-27 DIAGNOSIS — Z79899 Other long term (current) drug therapy: Secondary | ICD-10-CM | POA: Insufficient documentation

## 2020-02-27 DIAGNOSIS — I25118 Atherosclerotic heart disease of native coronary artery with other forms of angina pectoris: Secondary | ICD-10-CM

## 2020-02-27 DIAGNOSIS — C8384 Other non-follicular lymphoma, lymph nodes of axilla and upper limb: Secondary | ICD-10-CM

## 2020-02-27 DIAGNOSIS — Z8249 Family history of ischemic heart disease and other diseases of the circulatory system: Secondary | ICD-10-CM | POA: Diagnosis not present

## 2020-02-27 DIAGNOSIS — I2510 Atherosclerotic heart disease of native coronary artery without angina pectoris: Secondary | ICD-10-CM | POA: Insufficient documentation

## 2020-02-27 DIAGNOSIS — Z90722 Acquired absence of ovaries, bilateral: Secondary | ICD-10-CM | POA: Diagnosis not present

## 2020-02-27 DIAGNOSIS — I5042 Chronic combined systolic (congestive) and diastolic (congestive) heart failure: Secondary | ICD-10-CM | POA: Insufficient documentation

## 2020-02-27 DIAGNOSIS — E785 Hyperlipidemia, unspecified: Secondary | ICD-10-CM | POA: Insufficient documentation

## 2020-02-27 DIAGNOSIS — M549 Dorsalgia, unspecified: Secondary | ICD-10-CM | POA: Diagnosis not present

## 2020-02-27 DIAGNOSIS — C8304 Small cell B-cell lymphoma, lymph nodes of axilla and upper limb: Secondary | ICD-10-CM

## 2020-02-27 LAB — CBC WITH DIFFERENTIAL/PLATELET
Abs Immature Granulocytes: 0.02 10*3/uL (ref 0.00–0.07)
Basophils Absolute: 0 10*3/uL (ref 0.0–0.1)
Basophils Relative: 1 %
Eosinophils Absolute: 0.2 10*3/uL (ref 0.0–0.5)
Eosinophils Relative: 3 %
HCT: 33.4 % — ABNORMAL LOW (ref 36.0–46.0)
Hemoglobin: 11.7 g/dL — ABNORMAL LOW (ref 12.0–15.0)
Immature Granulocytes: 0 %
Lymphocytes Relative: 52 %
Lymphs Abs: 4 10*3/uL (ref 0.7–4.0)
MCH: 33 pg (ref 26.0–34.0)
MCHC: 35 g/dL (ref 30.0–36.0)
MCV: 94.1 fL (ref 80.0–100.0)
Monocytes Absolute: 0.5 10*3/uL (ref 0.1–1.0)
Monocytes Relative: 6 %
Neutro Abs: 2.9 10*3/uL (ref 1.7–7.7)
Neutrophils Relative %: 38 %
Platelets: 144 10*3/uL — ABNORMAL LOW (ref 150–400)
RBC: 3.55 MIL/uL — ABNORMAL LOW (ref 3.87–5.11)
RDW: 13.7 % (ref 11.5–15.5)
WBC: 7.6 10*3/uL (ref 4.0–10.5)
nRBC: 0 % (ref 0.0–0.2)

## 2020-02-27 LAB — COMPREHENSIVE METABOLIC PANEL
ALT: 10 U/L (ref 0–44)
AST: 13 U/L — ABNORMAL LOW (ref 15–41)
Albumin: 3.9 g/dL (ref 3.5–5.0)
Alkaline Phosphatase: 48 U/L (ref 38–126)
Anion gap: 10 (ref 5–15)
BUN: 24 mg/dL — ABNORMAL HIGH (ref 8–23)
CO2: 28 mmol/L (ref 22–32)
Calcium: 9.2 mg/dL (ref 8.9–10.3)
Chloride: 100 mmol/L (ref 98–111)
Creatinine, Ser: 1.03 mg/dL — ABNORMAL HIGH (ref 0.44–1.00)
GFR calc Af Amer: 59 mL/min — ABNORMAL LOW (ref >60)
GFR calc non Af Amer: 51 mL/min — ABNORMAL LOW (ref >60)
Glucose, Bld: 96 mg/dL (ref 70–99)
Potassium: 4.1 mmol/L (ref 3.5–5.1)
Sodium: 138 mmol/L (ref 135–145)
Total Bilirubin: 0.9 mg/dL (ref 0.3–1.2)
Total Protein: 7.2 g/dL (ref 6.5–8.1)

## 2020-02-27 LAB — LACTATE DEHYDROGENASE: LDH: 96 U/L — ABNORMAL LOW (ref 98–192)

## 2020-02-27 NOTE — Progress Notes (Signed)
[place] OFFICE PROGRESS NOTE  Patient Care Team: [name], MD as PCP - General (Unknown Physician Specialty) Wellington Hampshire, MD as PCP - Cardiology (Cardiology) Byrnett, Forest Gleason, MD (General Surgery) Forest Gleason, MD (Inactive) (Oncology) Wellington Hampshire, MD as Consulting Physician (Cardiology)  Cancer Staging No matching staging information was found for the patient.    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;  Rituxan once a week started in January of 2013  # July 2016-RECURRENCE- LYMPH NODE, LEFT AXILLA; EXCISIONAL BIOPSY:  - LOW-GRADE B-CELL LYMPHOMA, IMMUNOPHENOTYPICALLY MOST CONSISTENT WITH CLL/SLL, PET April 2018- STABLE/slight progression  # acute MI in December of 2014; #  upper and lower endoscopy done in May of 2015 ---------------------------------------------------   DIAGNOSIS: Follicular/MARGINAL ZONE LYMPHOMA/ SLL   STAGE:   IV   ;GOALS: control  CURRENT/MOST RECENT THERAPY : surveillance     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  female pleasant patient above history of recurrent low-grade lymphoma/B cell non-Hodgkin type is here for follow-up/review of PET scan.  In the interim patient was admitted to hospital for acute abdominal pain diagnosed with acute diverticulitis.  Treated conservatively symptoms improved.  Jeanne Pittman has lost some weight during the episode.  Appetite is poor.  Chronic fatigue.  Otherwise no new lumps or bumps.   Review of Systems  Constitutional:  Positive for malaise/fatigue and weight loss. Negative for chills, diaphoresis and fever.  HENT: Negative for nosebleeds and sore throat.   Eyes: Negative for double vision.  Respiratory: Negative for cough, hemoptysis, sputum production, shortness of breath and wheezing.   Cardiovascular: Negative for chest pain, palpitations, orthopnea and leg swelling.  Gastrointestinal: Negative for abdominal pain, blood in stool, constipation, diarrhea, heartburn, melena, nausea and vomiting.  Genitourinary: Negative for dysuria, frequency and urgency.  Musculoskeletal: Positive for back pain and joint pain.  Skin: Negative.  Negative for itching and rash.  Neurological: Negative for dizziness, tingling, focal weakness, weakness and headaches.  Endo/Heme/Allergies: Does not bruise/bleed easily.  Psychiatric/Behavioral: Negative for depression. The patient is not nervous/anxious and does not have insomnia.     PAST MEDICAL HISTORY :  Past Medical History:  Diagnosis Date  . A-fib ([place])    07/2012: A. fib with RVR in the setting of myocardial infarction. Converted to sinus rhythm with amiodarone. No recurrence.  . Anxiety   . Atrophic vaginitis   . CHF (congestive heart failure) ([place])   . Chronic combined systolic (congestive) and diastolic (congestive) heart failure ([place])    a. EF was 25-35% post MI but improved to 45-50% in 04/2013; b. 03/2017 Echo: EF 40-45%, mod focal/basal hypertrophy of septum. Sev mid-apicalanteroseptal, ant, and apical HK. Gr1 DD, mild AI/MR, mod TR, PASP 39mHg.  . Colon adenomas   . Coronary artery disease    a. 53/6644 STEMI complicated by cardiogenic shock. Cath/PCI: LAD 194m (DESx2), RCA 95p(DES). EF 25%; b. 03/2017 MV: EF 57%, fixed apical, periapical, mid to distal anteroseptal defect. No ischemia.  . Eczema   . Fibrocystic breast disease   .  Gastritis   . GERD (gastroesophageal reflux disease)   . Glaucoma   . Headache   . History of gastritis   . Hyperlipidemia   .  Hypertension   . Hypothyroidism   . Insomnia   . Ischemic cardiomyopathy    a. 07/2012 EF 25-35% following MI-->Improved to 45-50%;  b. 03/2017 Echo: EF 40-45%.  [name] Lymphoma ([place])   . Myocardial infarction ([place]) 08/18/2014  . Non Hodgkin's lymphoma ([place]) 2005   reoccurance 2007 and 2012  . Osteoarthritis   . Osteoporosis   . Polyneuropathy   . Polyposis of colon   . Restless leg syndrome   . Urinary, incontinence, stress female     PAST SURGICAL HISTORY :   Past Surgical History:  Procedure Laterality Date  . ABDOMINAL HYSTERECTOMY  1956  . APPENDECTOMY    . AXILLARY LYMPH NODE BIOPSY Left 03/18/2015   Procedure: AXILLARY LYMPH NODE BIOPSY;  Surgeon: [name], MD;  Location: [name];  Service: General;  Laterality: Left;  . CARDIAC CATHETERIZATION  08/19/2012   [name]; [place]  . COLONOSCOPY    . COLONOSCOPY WITH PROPOFOL N/A 03/02/2016   Procedure: COLONOSCOPY WITH PROPOFOL;  Surgeon: Manya Silvas, MD;  Location: [facility] ENDOSCOPY;  Service: Endoscopy;  Laterality: N/A;  . COLONOSCOPY WITH PROPOFOL N/A 09/26/2017   Procedure: COLONOSCOPY WITH PROPOFOL;  Surgeon: Manya Silvas, MD;  Location: [facility] ENDOSCOPY;  Service: Endoscopy;  Laterality: N/A;  . CORONARY ANGIOPLASTY WITH STENT PLACEMENT     x3 stents  . CORONARY ANGIOPLASTY WITH STENT PLACEMENT    . CORONARY ARTERY BYPASS GRAFT    . ESOPHAGOGASTRODUODENOSCOPY (EGD) WITH PROPOFOL N/A 03/02/2016   Procedure: ESOPHAGOGASTRODUODENOSCOPY (EGD) WITH PROPOFOL;  Surgeon: Manya Silvas, MD;  Location: [facility] ENDOSCOPY;  Service: Endoscopy;  Laterality: N/A;  . ESOPHAGOGASTRODUODENOSCOPY (EGD) WITH PROPOFOL N/A 04/14/2019   Procedure: ESOPHAGOGASTRODUODENOSCOPY (EGD) WITH PROPOFOL;  Surgeon: Toledo, Benay Pike, MD;  Location: [name] ENDOSCOPY;  Service: Gastroenterology;  Laterality: N/A;  . LYMPH NODE BIOPSY Right 07-24-11   Dr Bary Castilla  . MOHS SURGERY     bilateral shoulders  . PTCA    . skin cancer removal    . TOTAL  ABDOMINAL HYSTERECTOMY W/ BILATERAL SALPINGOOPHORECTOMY      FAMILY HISTORY :   Family History  Problem Relation Age of Onset  . Heart attack Father   . Heart disease Brother   . Leukemia Mother   . Bladder Cancer Neg Hx   . Prostate cancer Neg Hx   . Kidney cancer Neg Hx   . Breast cancer Neg Hx     SOCIAL HISTORY:   Social History   Tobacco Use  . Smoking status: Former Smoker    Types: Cigarettes    Quit date: 1950    Years since quitting: 71.5  . Smokeless tobacco: Never Used  Vaping Use  . Vaping Use: Never used  Substance Use Topics  . Alcohol use: No    Alcohol/week: 0.0 standard drinks  . Drug use: No    ALLERGIES:  is allergic to ace inhibitors, benadryl [diphenhydramine hcl], codeine, diphenhydramine, guaiacol, hydrocodone, cardizem [diltiazem], and guaifenesin & derivatives.  MEDICATIONS:  Current Outpatient Medications  Medication Sig Dispense Refill  . acetaminophen (TYLENOL) 650 MG CR tablet Take 650 mg by mouth every 8 (eight) hours as needed for pain.    [name] aspirin 81 MG tablet Take 81 mg by mouth every morning.     . carvedilol (COREG) 3.125 MG tablet TAKE 1 TABLET  BY MOUTH TWICE DAILY WITH A MEAL (Patient taking differently: Take 3.125 mg by mouth 2 (two) times daily with a meal. TAKE 1 TABLET BY MOUTH TWICE DAILY WITH A MEAL) 60 tablet 3  . Cholecalciferol (VITAMIN D-3) 5000 UNITS TABS Take 2,000 Int'l Units by mouth every morning.     . fexofenadine (ALLEGRA) 180 MG tablet Take 180 mg by mouth daily as needed for rhinitis.     . furosemide (LASIX) 20 MG tablet Take 1 tablet (20 mg total) by mouth daily as needed. 30 tablet 6  . gabapentin (NEURONTIN) 100 MG capsule Take 100 mg by mouth 3 (three) times daily.    [name] latanoprost (XALATAN) 0.005 % ophthalmic solution Place 1 drop into both eyes at bedtime.     [name] LORazepam (ATIVAN) 0.5 MG tablet Take 0.5 mg by mouth every 8 (eight) hours as needed for anxiety.     [name] losartan (COZAAR) 25 MG tablet TAKE 1/2  TABLET(12.5 MG) BY MOUTH DAILY AFTER BREAKFAST 45 tablet 3  . pantoprazole (PROTONIX) 40 MG tablet TAKE 1 TABLET(40 MG) BY MOUTH TWICE DAILY 180 tablet 0  . rosuvastatin (CRESTOR) 10 MG tablet TAKE 1 TABLET(10 MG) BY MOUTH EVERY OTHER DAY 45 tablet 3  . timolol (TIMOPTIC) 0.5 % ophthalmic solution Place 1 drop into both eyes 2 (two) times daily.     . valACYclovir (VALTREX) 1000 MG tablet Take 1,000 mg by mouth 2 (two) times daily. (Patient not taking: Reported on 02/27/2020)     No current facility-administered medications for this visit.    PHYSICAL EXAMINATION: ECOG PERFORMANCE STATUS: 0 - Asymptomatic  BP (!) 111/55   Pulse 65   Temp 98.1 F (36.7 C) (Tympanic)   Resp 16   Ht 5\' 1"  (1.549 m)   Wt 99 lb 12.8 oz (45.3 kg)   SpO2 100%   BMI 18.86 kg/m   Filed Weights   02/27/20 1050  Weight: 99 lb 12.8 oz (45.3 kg)    Physical Exam Constitutional:      Comments: Jeanne Pittman is accompanied by her son.  HENT:     Head: Normocephalic and atraumatic.     Mouth/Throat:     Pharynx: No oropharyngeal exudate.  Eyes:     Pupils: Pupils are equal, round, and reactive to light.  Cardiovascular:     Rate and Rhythm: Normal rate and regular rhythm.  Pulmonary:     Effort: No respiratory distress.     Breath sounds: No wheezing.  Abdominal:     General: Bowel sounds are normal. There is no distension.     Palpations: Abdomen is soft. There is no mass.     Tenderness: There is no abdominal tenderness. There is no guarding or rebound.  Musculoskeletal:        General: No tenderness. Normal range of motion.     Cervical back: Normal range of motion and neck supple.  Lymphadenopathy:     Comments: Bilateral axillary adenopathy 1-2 cm in size.   Skin:    General: Skin is warm.  Neurological:     Mental Status: Jeanne Pittman is alert and oriented to person, place, and time.  Psychiatric:        Mood and Affect: Affect normal.      LABORATORY DATA:  I have reviewed the data as listed     Component Value Date/Time   NA 138 02/27/2020 1023   NA 141 09/07/2014 1449   K 4.1 02/27/2020 1023   K 4.0 09/07/2014 1449  CL 100 02/27/2020 1023   CL 106 09/07/2014 1449   CO2 28 02/27/2020 1023   CO2 29 09/07/2014 1449   GLUCOSE 96 02/27/2020 1023   GLUCOSE 109 (H) 09/07/2014 1449   BUN 24 (H) 02/27/2020 1023   BUN 19 (H) 09/07/2014 1449   CREATININE 1.03 (H) 02/27/2020 1023   CREATININE 1.01 09/07/2014 1449   CALCIUM 9.2 02/27/2020 1023   CALCIUM 8.8 09/07/2014 1449   PROT 7.2 02/27/2020 1023   PROT 6.3 09/02/2015 0824   PROT 6.5 09/07/2014 1449   ALBUMIN 3.9 02/27/2020 1023   ALBUMIN 4.2 09/02/2015 0824   ALBUMIN 3.8 09/07/2014 1449   AST 13 (L) 02/27/2020 1023   AST 11 (L) 09/07/2014 1449   ALT 10 02/27/2020 1023   ALT 22 09/07/2014 1449   ALKPHOS 48 02/27/2020 1023   ALKPHOS 75 09/07/2014 1449   BILITOT 0.9 02/27/2020 1023   BILITOT 0.4 09/02/2015 0824   BILITOT 0.8 09/07/2014 1449   GFRNONAA 51 (L) 02/27/2020 1023   GFRNONAA 56 (L) 09/07/2014 1449   GFRNONAA 44 (L) 02/09/2014 1351   GFRAA 59 (L) 02/27/2020 1023   GFRAA >60 09/07/2014 1449   GFRAA 51 (L) 02/09/2014 1351    No results found for: SPEP, UPEP  Lab Results  Component Value Date   WBC 7.6 02/27/2020   NEUTROABS 2.9 02/27/2020   HGB 11.7 (L) 02/27/2020   HCT 33.4 (L) 02/27/2020   MCV 94.1 02/27/2020   PLT 144 (L) 02/27/2020      Chemistry      Component Value Date/Time   NA 138 02/27/2020 1023   NA 141 09/07/2014 1449   K 4.1 02/27/2020 1023   K 4.0 09/07/2014 1449   CL 100 02/27/2020 1023   CL 106 09/07/2014 1449   CO2 28 02/27/2020 1023   CO2 29 09/07/2014 1449   BUN 24 (H) 02/27/2020 1023   BUN 19 (H) 09/07/2014 1449   CREATININE 1.03 (H) 02/27/2020 1023   CREATININE 1.01 09/07/2014 1449      Component Value Date/Time   CALCIUM 9.2 02/27/2020 1023   CALCIUM 8.8 09/07/2014 1449   ALKPHOS 48 02/27/2020 1023   ALKPHOS 75 09/07/2014 1449   AST 13 (L) 02/27/2020 1023   AST  11 (L) 09/07/2014 1449   ALT 10 02/27/2020 1023   ALT 22 09/07/2014 1449   BILITOT 0.9 02/27/2020 1023   BILITOT 0.4 09/02/2015 0824   BILITOT 0.8 09/07/2014 1449      RADIOGRAPHIC STUDIES: I have personally reviewed the radiological images as listed and agreed with the findings in the report. No results found.   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 Status post multiple lines of therapy in the past.  Stable  # July 2021 PET scan- small lymphadenopathy above and below diaphragm.  Low FDG activity-STABLE.   #Again reviewed with the patient and son regarding-started chemotherapy options and also normal therapeutic options including but not limited to Gazyva lenalidomide; ibrutinib etc.  For now continue surveillance.  #Diverticulitis-recent acute event; weight loss improving-awaiting colonoscopy follow-up.  # Mild thrombocytopenia- 144 STABLE.  monitor for now.    # Fatigue-chronic unclear etiology.  Stable  # DISPOSITION:  # Follow-up in approximately 6 months-MD with labs-cbc/cm/ldh- Dr.B      Orders Placed This Encounter  Procedures  . CBC with Differential/Platelet    Standing Status:   Future  Standing Expiration Date:   02/26/2021  . Comprehensive metabolic panel    Standing Status:   Future    Standing Expiration Date:   02/26/2021  . Lactate dehydrogenase    Standing Status:   Future    Standing Expiration Date:   02/26/2021   All questions were answered. The patient knows to call the clinic with any problems, questions or concerns.      Cammie Sickle, MD 02/27/2020 5:04 PM

## 2020-02-27 NOTE — Assessment & Plan Note (Addendum)
#   LOW Grade lymphoma- B-cell non-Hodgkin's lymphoma [follicle lymphoma/marginal zone lymphoma/SLL]. Status post multiple lines of therapy in the past.  Stable  # July 2021 PET scan- small lymphadenopathy above and below diaphragm.  Low FDG activity-STABLE.   #Again reviewed with the patient and son regarding-started chemotherapy options and also normal therapeutic options including but not limited to Gazyva lenalidomide; ibrutinib etc.  For now continue surveillance.  #Diverticulitis-recent acute event; weight loss improving-awaiting colonoscopy follow-up.  # Mild thrombocytopenia- 144 STABLE.  monitor for now.    # Fatigue-chronic unclear etiology.  Stable  # DISPOSITION:  # Follow-up in approximately 6 months-MD with labs-cbc/cm/ldh- Dr.B

## 2020-03-09 ENCOUNTER — Other Ambulatory Visit
Admission: RE | Admit: 2020-03-09 | Discharge: 2020-03-09 | Disposition: A | Discharge status: Home / Self Care | Payer: Medicare Other | Source: Ambulatory Visit | Attending: Surgery | Admitting: Surgery

## 2020-03-09 ENCOUNTER — Other Ambulatory Visit: Payer: Self-pay

## 2020-03-09 DIAGNOSIS — Z01812 Encounter for preprocedural laboratory examination: Secondary | ICD-10-CM | POA: Insufficient documentation

## 2020-03-09 DIAGNOSIS — Z20822 Contact with and (suspected) exposure to covid-19: Secondary | ICD-10-CM | POA: Diagnosis not present

## 2020-03-09 LAB — SARS CORONAVIRUS 2 (TAT 6-24 HRS): SARS Coronavirus 2: NEGATIVE

## 2020-03-10 ENCOUNTER — Encounter: Payer: Self-pay | Admitting: Surgery

## 2020-03-11 ENCOUNTER — Ambulatory Visit
Admission: RE | Admit: 2020-03-11 | Discharge: 2020-03-11 | Disposition: A | Discharge status: Home / Self Care | Payer: Medicare Other | Attending: Surgery | Admitting: Surgery

## 2020-03-11 ENCOUNTER — Encounter: Payer: Self-pay | Admitting: Surgery

## 2020-03-11 ENCOUNTER — Ambulatory Visit: Payer: Medicare Other | Admitting: Anesthesiology

## 2020-03-11 ENCOUNTER — Encounter
Admission: RE | Disposition: A | Discharge status: Home / Self Care | Payer: Self-pay | Source: Home / Self Care | Attending: Surgery

## 2020-03-11 ENCOUNTER — Other Ambulatory Visit: Payer: Self-pay

## 2020-03-11 DIAGNOSIS — Z79899 Other long term (current) drug therapy: Secondary | ICD-10-CM | POA: Insufficient documentation

## 2020-03-11 DIAGNOSIS — I2510 Atherosclerotic heart disease of native coronary artery without angina pectoris: Secondary | ICD-10-CM | POA: Diagnosis not present

## 2020-03-11 DIAGNOSIS — F419 Anxiety disorder, unspecified: Secondary | ICD-10-CM | POA: Insufficient documentation

## 2020-03-11 DIAGNOSIS — I110 Hypertensive heart disease with heart failure: Secondary | ICD-10-CM | POA: Diagnosis not present

## 2020-03-11 DIAGNOSIS — I509 Heart failure, unspecified: Secondary | ICD-10-CM | POA: Diagnosis not present

## 2020-03-11 DIAGNOSIS — I252 Old myocardial infarction: Secondary | ICD-10-CM | POA: Insufficient documentation

## 2020-03-11 DIAGNOSIS — K6389 Other specified diseases of intestine: Secondary | ICD-10-CM | POA: Insufficient documentation

## 2020-03-11 DIAGNOSIS — Z87891 Personal history of nicotine dependence: Secondary | ICD-10-CM | POA: Insufficient documentation

## 2020-03-11 DIAGNOSIS — K640 First degree hemorrhoids: Secondary | ICD-10-CM | POA: Diagnosis not present

## 2020-03-11 DIAGNOSIS — Z7982 Long term (current) use of aspirin: Secondary | ICD-10-CM | POA: Insufficient documentation

## 2020-03-11 DIAGNOSIS — Z09 Encounter for follow-up examination after completed treatment for conditions other than malignant neoplasm: Secondary | ICD-10-CM | POA: Diagnosis present

## 2020-03-11 DIAGNOSIS — K5730 Diverticulosis of large intestine without perforation or abscess without bleeding: Secondary | ICD-10-CM | POA: Insufficient documentation

## 2020-03-11 HISTORY — PX: COLONOSCOPY WITH PROPOFOL: SHX5780

## 2020-03-11 SURGERY — COLONOSCOPY WITH PROPOFOL
Anesthesia: General

## 2020-03-11 MED ORDER — PROPOFOL 10 MG/ML IV BOLUS
INTRAVENOUS | Status: DC | PRN
  Administered 2020-03-11: 40 mg via INTRAVENOUS

## 2020-03-11 MED ORDER — PROPOFOL 10 MG/ML IV BOLUS
INTRAVENOUS | Status: AC
  Filled 2020-03-11: qty 20

## 2020-03-11 MED ORDER — SODIUM CHLORIDE 0.9 % IV SOLN: INTRAVENOUS | Status: DC

## 2020-03-11 MED ORDER — LIDOCAINE HCL (CARDIAC) PF 100 MG/5ML IV SOSY
PREFILLED_SYRINGE | INTRAVENOUS | Status: DC | PRN
  Administered 2020-03-11: 30 mg via INTRAVENOUS

## 2020-03-11 MED ORDER — GLYCOPYRROLATE 0.2 MG/ML IJ SOLN
INTRAMUSCULAR | Status: DC | PRN
  Administered 2020-03-11: .2 mg via INTRAVENOUS

## 2020-03-11 MED ORDER — PROPOFOL 500 MG/50ML IV EMUL
INTRAVENOUS | Status: DC | PRN
  Administered 2020-03-11: 80 ug/kg/min via INTRAVENOUS

## 2020-03-11 NOTE — Op Note (Signed)
[facility] Gastroenterology Patient Name: Jeanne Pittman Procedure Date: 03/11/2020 1:06 PM MRN: [id number] Account #: [id number] Date of Birth: 12-03-1934 Admit Type: Outpatient Age: 85 Room: [facility] ENDO ROOM 1 Gender: Female Note Status: Finalized Procedure:             Colonoscopy Indications:           Diverticula Providers:             Eliseo Squires MD, MD Referring MD:          Leonie Douglas. Doy Hutching, MD (Referring MD) Medicines:             Propofol per Anesthesia Complications:         No immediate complications. Procedure:             Pre-Anesthesia Assessment:                        - After reviewing the risks and benefits, the patient                         was deemed in satisfactory condition to undergo the                         procedure in an ambulatory setting.                        After obtaining informed consent, the colonoscope was                         passed under direct vision. Throughout the procedure,                         the patient's blood pressure, pulse, and oxygen                         saturations were monitored continuously. The                         Colonoscope was introduced through the anus and                         advanced to the the cecum, identified by the ileocecal                         valve. The colonoscopy was somewhat difficult due to a                         tortuous colon. Successful completion of the procedure                         was aided by applying abdominal pressure. Findings:      The perianal and digital rectal examinations were normal.      A diffuse area of friable mucosa was found from cecum to sigmoid colon.       Biopsies were taken with a cold forceps for histology. Estimated blood       loss was minimal.      Multiple small and large-mouthed diverticula were found in the sigmoid       colon and ascending colon.  Non-bleeding internal hemorrhoids were found during retroflexion. The        hemorrhoids were Grade I (internal hemorrhoids that do not prolapse). Impression:            - Friability from cecum to sigmoid colon. Biopsied.                        - Diverticulosis in the sigmoid colon and in the                         ascending colon.                        - Non-bleeding internal hemorrhoids. Recommendation:        - Await pathology results.                        - Written discharge instructions were provided to the                         patient.                        - Discharge patient to home.                        - Resume previous diet. Procedure Code(s):     --- Professional ---                        [phone], Colonoscopy, flexible; with biopsy, single or                         multiple Diagnosis Code(s):     --- Professional ---                        K63.89, Other specified diseases of intestine                        K64.0, First degree hemorrhoids                        K57.30, Diverticulosis of large intestine without                         perforation or abscess without bleeding CPT copyright 2019 American Medical Association. All rights reserved. The codes documented in this report are preliminary and upon coder review may  be revised to meet current compliance requirements. Dr. Sheppard Penton, MD Eliseo Squires MD, MD 03/11/2020 2:16:28 PM This report has been signed electronically. Number of Addenda: 0 Note Initiated On: 03/11/2020 1:06 PM Scope Withdrawal Time: 0 hours 12 minutes 53 seconds  Total Procedure Duration: 0 hours 36 minutes 35 seconds  Estimated Blood Loss:  Estimated blood loss was minimal.      [facility]

## 2020-03-11 NOTE — H&P (Signed)
Subjective:   CC: Diverticulitis [K57.92]   HPI: Jeanne Pittman is a 85 y.o. female who is here for followup from above. Loose stools and feeling tired after discharge. Augmentin causes her to have loose stools. She was under the impression she is not allowed to have any solid foods.   Current Medications: has a current medication list which includes the following prescription(s): acetaminophen, amoxicillin-clavulanate, aspirin, carvedilol, cholecalciferol (vitamin d3), fexofenadine, furosemide, gabapentin, latanoprost, lorazepam, losartan, pantoprazole, rosuvastatin, and timolol hemihydrate.  Allergies:  Allergies  Allergen Reactions  . Ace Inhibitors Cough  Other reaction(s): Cough  . Codeine Hallucination  Other reaction(s): Hallucination  . Diphenhydramine Hcl Unknown  Other reaction(s): Unknown  . Guaifenesin Rash  . Hydrocodone Hallucination  Other reaction(s): Hallucination  . Guaiacol Rash   ROS: General: Denies weight loss, weight gain, fatigue, fevers, chills, and night sweats. Heart: Denies chest pain, palpitations, racing heart, irregular heartbeat, leg pain or swelling, and decreased activity tolerance. Respiratory: Denies breathing difficulty, shortness of breath, wheezing, cough, and sputum. GI: Denies change in appetite, heartburn, nausea, vomiting, constipation, diarrhea, and blood in stool. GU: Denies difficulty urinating, pain with urinating, urgency, frequency, blood in urine   Objective:    BP 147/71  Pulse 70  Ht 152.4 cm (5')  Wt 47.6 kg (105 lb)  BMI 20.51 kg/m   Constitutional : alert, appears stated age, cooperative and no distress  Gastrointestinal: soft, non-tender; bowel sounds normal; no masses, no organomegaly.  Musculoskeletal: Steady gait and movement  Skin: Cool and moist,  Psychiatric: Normal affect, non-agitated, not confused    LABS:  N/A   RADS: N/A  Assessment:    Diverticulitis [K57.92], s/p hospitalization and  finishing abx course  Plan:    1. Encouraged more solid food intake and continue to monitor for dehydration. Should be feeling better as she is able to take more oral intake in. Pt requests colonoscopy f/u.

## 2020-03-11 NOTE — Interval H&P Note (Signed)
History and Physical Interval Note:  03/11/2020 1:15 PM  Jeanne Pittman  has presented today for surgery, with the diagnosis of Diverticulitis.  The various methods of treatment have been discussed with the patient and family. After consideration of risks, benefits and other options for treatment, the patient has consented to  Procedure(s): COLONOSCOPY WITH PROPOFOL (N/A) as a surgical intervention.  The patient's history has been reviewed, patient examined, no change in status, stable for surgery.  I have reviewed the patient's chart and labs.  Questions were answered to the patient's satisfaction.     Tieler Cournoyer Lysle Pearl

## 2020-03-11 NOTE — Transfer of Care (Signed)
Immediate Anesthesia Transfer of Care Note  Patient: Jeanne Pittman  Procedure(s) Performed: COLONOSCOPY WITH PROPOFOL (N/A )  Patient Location: PACU and Endoscopy Unit  Anesthesia Type:General  Level of Consciousness: drowsy  Airway & Oxygen Therapy: Patient Spontanous Breathing  Post-op Assessment: Report given to RN  Post vital signs: stable  Last Vitals:  Vitals Value Taken Time  BP    Temp    Pulse    Resp    SpO2      Last Pain:  Vitals:   03/11/20 1307  PainSc: 0-No pain         Complications: No complications documented.

## 2020-03-11 NOTE — Anesthesia Preprocedure Evaluation (Addendum)
Anesthesia Evaluation  Patient identified by MRN, date of birth, ID band Patient awake    Reviewed: Allergy & Precautions, H&P , NPO status , reviewed documented beta blocker date and time   Airway Mallampati: II  TM Distance: >3 FB Neck ROM: full    Dental no notable dental hx. (+) Chipped   Pulmonary former smoker,    Pulmonary exam normal        Cardiovascular hypertension, + CAD, + Past MI and +CHF  Normal cardiovascular examAtrial Fibrillation  Rhythm:regular  01/20/20 ECHO IMPRESSIONS    1. Left ventricular ejection fraction, by estimation, is 35 to 40%. The  left ventricle has moderately decreased function. The left ventricle  demonstrates regional wall motion abnormalities (see scoring  diagram/findings for description). There is  moderate left ventricular hypertrophy. Left ventricular diastolic  parameters are consistent with Grade I diastolic dysfunction (impaired  relaxation). There is severe hypokinesis of the left ventricular,  mid-apical anteroseptal wall and apical segment.  2. There is at least mild pulmonary hypertension (PASP 35 mmHg plus  central venous pressure). Right ventricular systolic function is normal.  The right ventricular size is normal.  3. The mitral valve is grossly normal. Mild mitral valve regurgitation.  No evidence of mitral stenosis.  4. Tricuspid valve regurgitation is moderate to severe.  5. The aortic valve is tricuspid. Aortic valve regurgitation is mild.  Mild aortic valve sclerosis is present, with no evidence of aortic valve  stenosis.   Hx Afib    Neuro/Psych  Headaches, PSYCHIATRIC DISORDERS Anxiety  Neuromuscular disease    GI/Hepatic GERD  Controlled,  Endo/Other  Hypothyroidism   Renal/GU      Musculoskeletal  (+) Arthritis ,   Abdominal   Peds  Hematology   Anesthesia Other Findings Past Medical History: No date: A-fib [facility])     Comment:  07/2012: A.  fib with RVR in the setting of myocardial infarction. Converted to sinus rhythm with amiodarone. Had episode 01/19/20, converted to SR, remained stable since. No date: Anxiety No date: Atrophic vaginitis No date: CHF (congestive heart failure) (HCC) No date: Chronic combined systolic (congestive) and diastolic  (congestive) heart failure (HCC)     Comment:  a. EF was 25-35% post MI but improved to 45-50% in               04/2013; b. 03/2017 Echo: EF 40-45%, mod focal/basal               hypertrophy of septum. Sev mid-apicalanteroseptal, ant,               and apical HK. Gr1 DD, mild AI/MR, mod TR, PASP 62mHg. No date: Colon adenomas No date: Coronary artery disease     Comment:  a. 28/3151 STEMI complicated by cardiogenic shock.               Cath/PCI: LAD 131m (DESx2), RCA 95p(DES). EF 25%; b.               03/2017 MV: EF 57%, fixed apical, periapical, mid to               distal anteroseptal defect. No ischemia. No date: Eczema No date: Fibrocystic breast disease No date: Gastritis No date: GERD (gastroesophageal reflux disease) No date: Glaucoma No date: Headache No date: History of gastritis No date: Hyperlipidemia No date: Hypertension No date: Hypothyroidism No date: Insomnia No date: Ischemic cardiomyopathy     Comment:  a. 07/2012 EF 25-35% following MI-->Improved to  45-50%;               b. 03/2017 Echo: EF 40-45%. No date: Lymphoma ([place]) 08/18/2014: Myocardial infarction [facility]) 2005: Non Hodgkin's lymphoma ([place])     Comment:  reoccurance 2007 and 2012 No date: Osteoarthritis No date: Osteoporosis No date: Polyneuropathy No date: Polyposis of colon No date: Restless leg syndrome No date: Urinary, incontinence, stress female  Past Surgical History: 1956: ABDOMINAL HYSTERECTOMY No date: APPENDECTOMY 03/18/2015: AXILLARY LYMPH NODE BIOPSY; Left     Comment:  Procedure: AXILLARY LYMPH NODE BIOPSY;  Surgeon: [name], MD;  Location: [name];   Service: General;                Laterality: Left; 08/19/2012: CARDIAC CATHETERIZATION     Comment:  [place]; [place] No date: COLONOSCOPY 03/02/2016: COLONOSCOPY WITH PROPOFOL; N/A     Comment:  Procedure: COLONOSCOPY WITH PROPOFOL;  Surgeon: Manya Silvas, MD;  Location: [facility] ENDOSCOPY;  Service:               Endoscopy;  Laterality: N/A; 09/26/2017: COLONOSCOPY WITH PROPOFOL; N/A     Comment:  Procedure: COLONOSCOPY WITH PROPOFOL;  Surgeon: Manya Silvas, MD;  Location: [name] ENDOSCOPY;  Service:               Endoscopy;  Laterality: N/A; No date: CORONARY ANGIOPLASTY WITH STENT PLACEMENT     Comment:  x3 stents No date: CORONARY ANGIOPLASTY WITH STENT PLACEMENT No date: CORONARY ARTERY BYPASS GRAFT 03/02/2016: ESOPHAGOGASTRODUODENOSCOPY (EGD) WITH PROPOFOL; N/A     Comment:  Procedure: ESOPHAGOGASTRODUODENOSCOPY (EGD) WITH               PROPOFOL;  Surgeon: Manya Silvas, MD;  Location: [facility]              ENDOSCOPY;  Service: Endoscopy;  Laterality: N/A; 04/14/2019: ESOPHAGOGASTRODUODENOSCOPY (EGD) WITH PROPOFOL; N/A     Comment:  Procedure: ESOPHAGOGASTRODUODENOSCOPY (EGD) WITH               PROPOFOL;  Surgeon: Toledo, Benay Pike, MD;  Location:               [name] ENDOSCOPY;  Service: Gastroenterology;  Laterality:               N/A; 07-24-11: LYMPH NODE BIOPSY; Right     Comment:  Dr Bary Castilla No date: MOHS SURGERY     Comment:  bilateral shoulders No date: PTCA No date: skin cancer removal No date: TOTAL ABDOMINAL HYSTERECTOMY W/ BILATERAL SALPINGOOPHORECTOMY     Reproductive/Obstetrics                           Anesthesia Physical Anesthesia Plan  ASA: III  Anesthesia Plan: General   Post-op Pain Management:    Induction: Intravenous  PONV Risk Score and Plan: Treatment may vary due to age or medical condition and TIVA  Airway Management Planned: Nasal Cannula and Natural Airway  Additional Equipment:   Intra-op  Plan:   Post-operative Plan:   Informed Consent: I have reviewed the patients History and Physical, chart, labs and discussed the procedure including the risks, benefits and alternatives for the proposed anesthesia with the patient or authorized representative who has indicated  his/her understanding and acceptance.     Dental Advisory Given  Plan Discussed with: CRNA  Anesthesia Plan Comments:        Anesthesia Quick Evaluation

## 2020-03-12 ENCOUNTER — Encounter: Payer: Self-pay | Admitting: Surgery

## 2020-03-15 ENCOUNTER — Other Ambulatory Visit: Payer: Self-pay

## 2020-03-15 LAB — SURGICAL PATHOLOGY

## 2020-03-15 MED ORDER — PANTOPRAZOLE SODIUM 40 MG PO TBEC: DELAYED_RELEASE_TABLET | ORAL | 0 refills | Status: DC

## 2020-03-18 NOTE — Anesthesia Postprocedure Evaluation (Signed)
Anesthesia Post Note  Patient: SACHE SANE  Procedure(s) Performed: COLONOSCOPY WITH PROPOFOL (N/A )  Patient location during evaluation: Endoscopy Anesthesia Type: General Level of consciousness: awake and alert Pain management: pain level controlled Vital Signs Assessment: post-procedure vital signs reviewed and stable Respiratory status: spontaneous breathing, nonlabored ventilation and respiratory function stable Cardiovascular status: blood pressure returned to baseline and stable Postop Assessment: no apparent nausea or vomiting Anesthetic complications: no   No complications documented.   Last Vitals:  Vitals:   03/11/20 1443 03/11/20 1453  BP: (!) 132/57 (!) 128/55  Pulse: 74 69  Resp: 21 21  Temp:    SpO2: 98% 97%    Last Pain:  Vitals:   03/11/20 1453  TempSrc:   PainSc: 0-No pain                 Alphonsus Sias

## 2020-04-01 ENCOUNTER — Other Ambulatory Visit: Payer: Self-pay | Admitting: Cardiovascular Disease

## 2020-04-07 ENCOUNTER — Telehealth: Payer: Self-pay | Admitting: *Deleted

## 2020-04-07 NOTE — Telephone Encounter (Signed)
Patient called to obtain information on scheduling the third dose covid-19 booster vaccination appointment. Information provided to patient. Pt instructed to visit [email or web address] or call [phone] Monday through Friday between 7 a.m. and 7 p.m.

## 2020-04-12 ENCOUNTER — Ambulatory Visit: Payer: Medicare Other | Attending: Internal Medicine

## 2020-04-12 DIAGNOSIS — Z23 Encounter for immunization: Secondary | ICD-10-CM

## 2020-04-12 NOTE — Progress Notes (Signed)
   Covid-19 Vaccination Clinic  Name:  Jeanne Pittman    MRN: [id number] DOB: Jul 11, 1935  04/12/2020  Jeanne Pittman was observed post Covid-19 immunization for 15 minutes without incident. She was provided with Vaccine Information Sheet and instruction to access the V-Safe system.   Jeanne Pittman was instructed to call 911 with any severe reactions post vaccine: [name] Difficulty breathing  . Swelling of face and throat  . A fast heartbeat  . A bad rash all over body  . Dizziness and weakness

## 2020-05-13 ENCOUNTER — Ambulatory Visit (INDEPENDENT_AMBULATORY_CARE_PROVIDER_SITE_OTHER): Payer: Medicare Other | Admitting: Cardiovascular Disease

## 2020-05-13 ENCOUNTER — Encounter: Payer: Self-pay | Admitting: Cardiovascular Disease

## 2020-05-13 ENCOUNTER — Other Ambulatory Visit: Payer: Self-pay

## 2020-05-13 VITALS — BP 118/58 | HR 67 | Ht 61.0 in | Wt 100.0 lb

## 2020-05-13 DIAGNOSIS — I255 Ischemic cardiomyopathy: Secondary | ICD-10-CM

## 2020-05-13 DIAGNOSIS — I2510 Atherosclerotic heart disease of native coronary artery without angina pectoris: Secondary | ICD-10-CM

## 2020-05-13 DIAGNOSIS — I5022 Chronic systolic (congestive) heart failure: Secondary | ICD-10-CM | POA: Diagnosis not present

## 2020-05-13 DIAGNOSIS — E785 Hyperlipidemia, unspecified: Secondary | ICD-10-CM | POA: Diagnosis not present

## 2020-05-13 NOTE — Progress Notes (Signed)
Cardiology Office Note   Date:  05/13/2020   ID:  Jeanne Pittman, DOB 1934-11-23, MRN [id number]  PCP:  [name], MD  Cardiologist:   Kathlyn Sacramento, MD   Chief Complaint  Patient presents with  . Follow-up    6 Month follow up. Medications verbally reviewed with patient.       History of Present Illness: Jeanne Pittman is a 85 y.o. female who presents for a followup visit regarding coronary artery disease and chronic systolic heart failure due to ischemic cardiomyopathy. She had anterior ST elevation myocardial infarction in December 2013 with late presentation complicated by cardiogenic shock. Emergent cardiac catheterization showed an occluded mid LAD and 95% stenosis in the proximal RCA. 2 drug-eluting stents were placed in the mid LAD and 1 drug-eluting stent to the proximal RCA. Ejection fraction was 25-30% with akinesis of the mid distal anterior, apical and distal inferior wall.  She has known history of non-Hodgkin's lymphoma which is being observed. She has known history of myalgia with atorvastatin but she has been tolerating rosuvastatin.  She underwent cardiac work-up earlier this year due to fatigue and shortness of breath.  Echocardiogram showed stable EF of 40 to 45%.  Lexiscan Myoview showed mildly reduced ejection fraction with fixed anterior apical defect consistent with prior infarct with minimal peri-infarct ischemia.  No significant change from previous test in 2018.  She was hospitalized in May with diverticulitis and improved with treatment.  She did have significant weight loss at that time but her weight is improving again.  No chest pain.  She has stable symptoms of shortness of breath and fatigue.  Past Medical History:  Diagnosis Date  . A-fib ([place])    07/2012: A. fib with RVR in the setting of myocardial infarction. Converted to sinus rhythm with amiodarone. No recurrence.  . Anxiety   . Atrophic vaginitis   . CHF (congestive heart failure)  ([place])   . Chronic combined systolic (congestive) and diastolic (congestive) heart failure ([place])    a. EF was 25-35% post MI but improved to 45-50% in 04/2013; b. 03/2017 Echo: EF 40-45%, mod focal/basal hypertrophy of septum. Sev mid-apicalanteroseptal, ant, and apical HK. Gr1 DD, mild AI/MR, mod TR, PASP 59mHg.  . Colon adenomas   . Coronary artery disease    a. 66/5993 STEMI complicated by cardiogenic shock. Cath/PCI: LAD 124m (DESx2), RCA 95p(DES). EF 25%; b. 03/2017 MV: EF 57%, fixed apical, periapical, mid to distal anteroseptal defect. No ischemia.  . Eczema   . Fibrocystic breast disease   . Gastritis   . GERD (gastroesophageal reflux disease)   . Glaucoma   . Headache   . History of gastritis   . Hyperlipidemia   . Hypertension   . Hypothyroidism   . Insomnia   . Ischemic cardiomyopathy    a. 07/2012 EF 25-35% following MI-->Improved to 45-50%;  b. 03/2017 Echo: EF 40-45%.  [name] Lymphoma ([place])   . Myocardial infarction ([place]) 08/18/2014  . Non Hodgkin's lymphoma ([name]) 2005   reoccurance 2007 and 2012  . Osteoarthritis   . Osteoporosis   . Polyneuropathy   . Polyposis of colon   . Restless leg syndrome   . Urinary, incontinence, stress female     Past Surgical History:  Procedure Laterality Date  . ABDOMINAL HYSTERECTOMY  1956  . APPENDECTOMY    . AXILLARY LYMPH NODE BIOPSY Left 03/18/2015   Procedure: AXILLARY LYMPH NODE BIOPSY;  Surgeon: [name], MD;  Location: [facility]  ORS;  Service: General;  Laterality: Left;  . CARDIAC CATHETERIZATION  08/19/2012   [name]; Zanita Millman  . COLONOSCOPY    . COLONOSCOPY WITH PROPOFOL N/A 03/02/2016   Procedure: COLONOSCOPY WITH PROPOFOL;  Surgeon: Manya Silvas, MD;  Location: [facility] ENDOSCOPY;  Service: Endoscopy;  Laterality: N/A;  . COLONOSCOPY WITH PROPOFOL N/A 09/26/2017   Procedure: COLONOSCOPY WITH PROPOFOL;  Surgeon: Manya Silvas, MD;  Location: [facility] ENDOSCOPY;  Service: Endoscopy;  Laterality: N/A;  . COLONOSCOPY WITH PROPOFOL  N/A 03/11/2020   Procedure: COLONOSCOPY WITH PROPOFOL;  Surgeon: Benjamine Sprague, DO;  Location: [name] ENDOSCOPY;  Service: General;  Laterality: N/A;  . CORONARY ANGIOPLASTY WITH STENT PLACEMENT     x3 stents  . CORONARY ANGIOPLASTY WITH STENT PLACEMENT    . CORONARY ARTERY BYPASS GRAFT    . ESOPHAGOGASTRODUODENOSCOPY (EGD) WITH PROPOFOL N/A 03/02/2016   Procedure: ESOPHAGOGASTRODUODENOSCOPY (EGD) WITH PROPOFOL;  Surgeon: Manya Silvas, MD;  Location: [facility] ENDOSCOPY;  Service: Endoscopy;  Laterality: N/A;  . ESOPHAGOGASTRODUODENOSCOPY (EGD) WITH PROPOFOL N/A 04/14/2019   Procedure: ESOPHAGOGASTRODUODENOSCOPY (EGD) WITH PROPOFOL;  Surgeon: Toledo, Benay Pike, MD;  Location: [name] ENDOSCOPY;  Service: Gastroenterology;  Laterality: N/A;  . LYMPH NODE BIOPSY Right 07-24-11   Dr Bary Castilla  . MOHS SURGERY     bilateral shoulders  . PTCA    . skin cancer removal    . TOTAL ABDOMINAL HYSTERECTOMY W/ BILATERAL SALPINGOOPHORECTOMY       Current Outpatient Medications  Medication Sig Dispense Refill  . acetaminophen (TYLENOL) 650 MG CR tablet Take 650 mg by mouth every 8 (eight) hours as needed for pain.    [name] aspirin 81 MG tablet Take 81 mg by mouth every morning.     . carvedilol (COREG) 3.125 MG tablet TAKE 1 TABLET BY MOUTH TWICE DAILY WITH A MEAL 60 tablet 1  . Cholecalciferol (VITAMIN D-3) 5000 UNITS TABS Take 2,000 Int'l Units by mouth every morning.     . fexofenadine (ALLEGRA) 180 MG tablet Take 180 mg by mouth daily as needed for rhinitis.     . furosemide (LASIX) 20 MG tablet Take 1 tablet (20 mg total) by mouth daily as needed. 30 tablet 6  . gabapentin (NEURONTIN) 100 MG capsule Take 100 mg by mouth 3 (three) times daily.    [name] latanoprost (XALATAN) 0.005 % ophthalmic solution Place 1 drop into both eyes at bedtime.     [name] LORazepam (ATIVAN) 0.5 MG tablet Take 0.5 mg by mouth every 8 (eight) hours as needed for anxiety.     [name] losartan (COZAAR) 25 MG tablet TAKE 1/2 TABLET(12.5 MG) BY MOUTH  DAILY AFTER BREAKFAST 45 tablet 3  . pantoprazole (PROTONIX) 40 MG tablet TAKE 1 TABLET(40 MG) BY MOUTH TWICE DAILY 180 tablet 0  . rosuvastatin (CRESTOR) 10 MG tablet TAKE 1 TABLET(10 MG) BY MOUTH EVERY OTHER DAY 45 tablet 3  . timolol (TIMOPTIC) 0.5 % ophthalmic solution Place 1 drop into both eyes 2 (two) times daily.      No current facility-administered medications for this visit.    Allergies:   Ace inhibitors, Benadryl [diphenhydramine hcl], Codeine, Diphenhydramine, Guaiacol, Hydrocodone, Cardizem [diltiazem], and Guaifenesin & derivatives    Social History:  The patient  reports that she quit smoking about 71 years ago. Her smoking use included cigarettes. She has never used smokeless tobacco. She reports that she does not drink alcohol and does not use drugs.   Family History:  The patient's family history includes Heart attack in her father;  Heart disease in her brother; Leukemia in her mother.    ROS:  Please see the history of present illness.   Otherwise, review of systems are positive for none.   All other systems are reviewed and negative.    PHYSICAL EXAM: VS:  BP (!) 118/58 (BP Location: Left Arm, Patient Position: Sitting, Cuff Size: Normal)   Pulse 67   Ht 5\' 1"  (1.549 m)   Wt 100 lb (45.4 kg)   SpO2 95%   BMI 18.89 kg/m  , BMI Body mass index is 18.89 kg/m. GEN: Well nourished, well developed, in no acute distress  HEENT: normal  Neck: no JVD, carotid bruits, or masses Cardiac: RRR; no murmurs, rubs, or gallops,no edema  Respiratory:  clear to auscultation bilaterally, normal work of breathing GI: soft, nontender, nondistended, + BS MS: no deformity or atrophy  Skin: warm and dry, no rash Neuro:  Strength and sensation are intact Psych: euthymic mood, full affect   EKG:  EKG is ordered today. The ekg ordered today demonstrates normal sinus rhythm with old anterior infarct.  Low voltage  Recent Labs: 01/19/2020: TSH 1.471 01/24/2020: Magnesium  1.8 02/27/2020: ALT 10; BUN 24; Creatinine, Ser 1.03; Hemoglobin 11.7; Platelets 144; Potassium 4.1; Sodium 138    Lipid Panel    Component Value Date/Time   CHOL 172 09/02/2015 0824   TRIG 111 09/02/2015 0824   HDL 51 09/02/2015 0824   CHOLHDL 3.4 09/02/2015 0824   LDLCALC 99 09/02/2015 0824      Wt Readings from Last 3 Encounters:  05/13/20 100 lb (45.4 kg)  03/11/20 101 lb (45.8 kg)  02/27/20 99 lb 12.8 oz (45.3 kg)        ASSESSMENT AND PLAN:  1.  Coronary artery disease involving native coronary arteries with other forms of angina: She reports stable exertional dyspnea and fatigue.   No chest pain.  Her symptoms seem to be stable.  Continue medical therapy.  2. Hyperlipidemia: She is tolerating rosuvastatin.  I reviewed most recent lipid profile done in February which showed an LDL of 64 and triglyceride of 70.  3. Ischemic cardiomyopathy: Most recent ejection fraction was 40-45 %. Continue low-dose carvedilol and losartan. She is euvolemic.    Disposition:   FU with me in 6 months  Signed,  Kathlyn Sacramento, MD  05/13/2020 2:36 PM    [facility]

## 2020-05-13 NOTE — Patient Instructions (Signed)

## 2020-07-12 ENCOUNTER — Other Ambulatory Visit: Payer: Self-pay | Admitting: Cardiovascular Disease

## 2020-07-20 ENCOUNTER — Other Ambulatory Visit: Payer: Self-pay | Admitting: Cardiovascular Disease

## 2020-07-20 MED ORDER — CARVEDILOL 3.125 MG PO TABS: 3.1250 mg | ORAL_TABLET | Freq: Two times a day (BID) | ORAL | 0 refills | Status: DC

## 2020-07-20 NOTE — Telephone Encounter (Signed)
Requested Prescriptions   Signed Prescriptions Disp Refills  . carvedilol (COREG) 3.125 MG tablet 60 tablet 0    Sig: Take 1 tablet (3.125 mg total) by mouth 2 (two) times daily with a meal.    Authorizing Provider: Kathlyn Sacramento A    Ordering User: Britt Bottom

## 2020-07-20 NOTE — Telephone Encounter (Signed)
Per patient carvedilol no received .  Please resend

## 2020-07-23 ENCOUNTER — Other Ambulatory Visit: Payer: Self-pay | Admitting: Cardiovascular Disease

## 2020-08-23 ENCOUNTER — Other Ambulatory Visit: Payer: Self-pay | Admitting: *Deleted

## 2020-08-23 MED ORDER — ROSUVASTATIN CALCIUM 10 MG PO TABS: ORAL_TABLET | ORAL | 0 refills | Status: DC

## 2020-08-27 ENCOUNTER — Other Ambulatory Visit: Payer: Medicare Other

## 2020-08-27 ENCOUNTER — Ambulatory Visit: Payer: Medicare Other | Admitting: Internal Medicine

## 2020-09-06 ENCOUNTER — Other Ambulatory Visit: Payer: Self-pay | Admitting: Cardiovascular Disease

## 2020-09-08 ENCOUNTER — Telehealth: Payer: Self-pay | Admitting: Cardiovascular Disease

## 2020-09-08 ENCOUNTER — Telehealth: Payer: Self-pay | Admitting: Internal Medicine

## 2020-09-08 MED ORDER — CARVEDILOL 3.125 MG PO TABS: 3.1250 mg | ORAL_TABLET | Freq: Two times a day (BID) | ORAL | 1 refills | Status: DC

## 2020-09-08 NOTE — Telephone Encounter (Signed)
Received fax from pt pharmacy, [facility] on [address] in [place], requesting 90 day supply of Carvedilol 3.125 mg. Rx request sent to pharmacy.

## 2020-09-08 NOTE — Telephone Encounter (Signed)
Pt left VM requesting a return call to reschedule appointment for 1/21. Message to team sent.

## 2020-09-10 ENCOUNTER — Inpatient Hospital Stay: Payer: Medicare Other | Admitting: Internal Medicine

## 2020-09-10 ENCOUNTER — Inpatient Hospital Stay: Payer: Medicare Other

## 2020-09-24 ENCOUNTER — Inpatient Hospital Stay: Payer: Medicare Other | Attending: Internal Medicine

## 2020-09-24 ENCOUNTER — Inpatient Hospital Stay (HOSPITAL_BASED_OUTPATIENT_CLINIC_OR_DEPARTMENT_OTHER): Payer: Medicare Other | Admitting: Internal Medicine

## 2020-09-24 ENCOUNTER — Encounter: Payer: Self-pay | Admitting: Internal Medicine

## 2020-09-24 DIAGNOSIS — C8304 Small cell B-cell lymphoma, lymph nodes of axilla and upper limb: Secondary | ICD-10-CM

## 2020-09-24 DIAGNOSIS — Z87891 Personal history of nicotine dependence: Secondary | ICD-10-CM | POA: Insufficient documentation

## 2020-09-24 DIAGNOSIS — Z86010 Personal history of colonic polyps: Secondary | ICD-10-CM | POA: Diagnosis not present

## 2020-09-24 DIAGNOSIS — R5383 Other fatigue: Secondary | ICD-10-CM | POA: Diagnosis not present

## 2020-09-24 DIAGNOSIS — E039 Hypothyroidism, unspecified: Secondary | ICD-10-CM | POA: Diagnosis not present

## 2020-09-24 DIAGNOSIS — Z79899 Other long term (current) drug therapy: Secondary | ICD-10-CM | POA: Insufficient documentation

## 2020-09-24 DIAGNOSIS — Z8719 Personal history of other diseases of the digestive system: Secondary | ICD-10-CM | POA: Diagnosis not present

## 2020-09-24 DIAGNOSIS — Z90722 Acquired absence of ovaries, bilateral: Secondary | ICD-10-CM | POA: Diagnosis not present

## 2020-09-24 DIAGNOSIS — Z888 Allergy status to other drugs, medicaments and biological substances status: Secondary | ICD-10-CM | POA: Insufficient documentation

## 2020-09-24 DIAGNOSIS — M549 Dorsalgia, unspecified: Secondary | ICD-10-CM | POA: Diagnosis not present

## 2020-09-24 DIAGNOSIS — Z806 Family history of leukemia: Secondary | ICD-10-CM | POA: Insufficient documentation

## 2020-09-24 DIAGNOSIS — I5042 Chronic combined systolic (congestive) and diastolic (congestive) heart failure: Secondary | ICD-10-CM | POA: Diagnosis not present

## 2020-09-24 DIAGNOSIS — E785 Hyperlipidemia, unspecified: Secondary | ICD-10-CM | POA: Diagnosis not present

## 2020-09-24 DIAGNOSIS — I2510 Atherosclerotic heart disease of native coronary artery without angina pectoris: Secondary | ICD-10-CM | POA: Diagnosis not present

## 2020-09-24 DIAGNOSIS — C884 Extranodal marginal zone B-cell lymphoma of mucosa-associated lymphoid tissue [MALT-lymphoma]: Secondary | ICD-10-CM | POA: Insufficient documentation

## 2020-09-24 DIAGNOSIS — Z8249 Family history of ischemic heart disease and other diseases of the circulatory system: Secondary | ICD-10-CM | POA: Diagnosis not present

## 2020-09-24 DIAGNOSIS — D696 Thrombocytopenia, unspecified: Secondary | ICD-10-CM | POA: Diagnosis not present

## 2020-09-24 DIAGNOSIS — M2550 Pain in unspecified joint: Secondary | ICD-10-CM | POA: Diagnosis not present

## 2020-09-24 DIAGNOSIS — Z9049 Acquired absence of other specified parts of digestive tract: Secondary | ICD-10-CM | POA: Diagnosis not present

## 2020-09-24 DIAGNOSIS — I252 Old myocardial infarction: Secondary | ICD-10-CM | POA: Insufficient documentation

## 2020-09-24 DIAGNOSIS — Z885 Allergy status to narcotic agent status: Secondary | ICD-10-CM | POA: Insufficient documentation

## 2020-09-24 LAB — COMPREHENSIVE METABOLIC PANEL
ALT: 12 U/L (ref 0–44)
AST: 16 U/L (ref 15–41)
Albumin: 3.9 g/dL (ref 3.5–5.0)
Alkaline Phosphatase: 51 U/L (ref 38–126)
Anion gap: 9 (ref 5–15)
BUN: 26 mg/dL — ABNORMAL HIGH (ref 8–23)
CO2: 26 mmol/L (ref 22–32)
Calcium: 9.2 mg/dL (ref 8.9–10.3)
Chloride: 105 mmol/L (ref 98–111)
Creatinine, Ser: 1.13 mg/dL — ABNORMAL HIGH (ref 0.44–1.00)
GFR, Estimated: 48 mL/min — ABNORMAL LOW (ref >60)
Glucose, Bld: 116 mg/dL — ABNORMAL HIGH (ref 70–99)
Potassium: 4.1 mmol/L (ref 3.5–5.1)
Sodium: 140 mmol/L (ref 135–145)
Total Bilirubin: 0.9 mg/dL (ref 0.3–1.2)
Total Protein: 7 g/dL (ref 6.5–8.1)

## 2020-09-24 LAB — CBC WITH DIFFERENTIAL/PLATELET
Abs Immature Granulocytes: 0.01 10*3/uL (ref 0.00–0.07)
Basophils Absolute: 0.1 10*3/uL (ref 0.0–0.1)
Basophils Relative: 1 %
Eosinophils Absolute: 0.2 10*3/uL (ref 0.0–0.5)
Eosinophils Relative: 2 %
HCT: 34.2 % — ABNORMAL LOW (ref 36.0–46.0)
Hemoglobin: 11.7 g/dL — ABNORMAL LOW (ref 12.0–15.0)
Immature Granulocytes: 0 %
Lymphocytes Relative: 57 %
Lymphs Abs: 5.1 10*3/uL — ABNORMAL HIGH (ref 0.7–4.0)
MCH: 32.3 pg (ref 26.0–34.0)
MCHC: 34.2 g/dL (ref 30.0–36.0)
MCV: 94.5 fL (ref 80.0–100.0)
Monocytes Absolute: 0.5 10*3/uL (ref 0.1–1.0)
Monocytes Relative: 6 %
Neutro Abs: 3.1 10*3/uL (ref 1.7–7.7)
Neutrophils Relative %: 34 %
Platelets: 139 10*3/uL — ABNORMAL LOW (ref 150–400)
RBC: 3.62 MIL/uL — ABNORMAL LOW (ref 3.87–5.11)
RDW: 12.9 % (ref 11.5–15.5)
WBC: 9 10*3/uL (ref 4.0–10.5)
nRBC: 0 % (ref 0.0–0.2)

## 2020-09-24 LAB — LACTATE DEHYDROGENASE: LDH: 103 U/L (ref 98–192)

## 2020-09-24 NOTE — Progress Notes (Signed)
Patient states she is having neck, shoulder and back of the head stiffness and pain and it is effecting her driving.

## 2020-09-24 NOTE — Progress Notes (Signed)
[place] OFFICE PROGRESS NOTE  Patient Care Team: [name], MD as PCP - General (Unknown Physician Specialty) Wellington Hampshire, MD as PCP - Cardiology (Cardiology) Byrnett, Forest Gleason, MD (General Surgery) Forest Gleason, MD (Inactive) (Oncology) Wellington Hampshire, MD as Consulting Physician (Cardiology)  Cancer Staging No matching staging information was found for the patient.    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;  Rituxan once a week started in January of 2013  # July 2016-RECURRENCE- LYMPH NODE, LEFT AXILLA; EXCISIONAL BIOPSY:  - LOW-GRADE B-CELL LYMPHOMA, IMMUNOPHENOTYPICALLY MOST CONSISTENT WITH CLL/SLL, PET April 2018- STABLE/slight progression  # acute MI in December of 2014; #  upper and lower endoscopy done in May of 2015 ---------------------------------------------------   DIAGNOSIS: Follicular/MARGINAL ZONE LYMPHOMA/ SLL   STAGE:   IV   ;GOALS: control  CURRENT/MOST RECENT THERAPY : surveillance     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  female pleasant patient above history of recurrent low-grade lymphoma/B cell non-Hodgkin type is here for follow-up.  In the interim patient has not had any hospitalizations.  Denies any new worsening lumps or bumps.  Appetite fair.  No night sweats.   Review of Systems  Constitutional: Positive for malaise/fatigue and weight loss. Negative for chills, diaphoresis and fever.  HENT: Negative for nosebleeds and sore throat.   Eyes: Negative  for double vision.  Respiratory: Negative for cough, hemoptysis, sputum production, shortness of breath and wheezing.   Cardiovascular: Negative for chest pain, palpitations, orthopnea and leg swelling.  Gastrointestinal: Negative for abdominal pain, blood in stool, constipation, diarrhea, heartburn, melena, nausea and vomiting.  Genitourinary: Negative for dysuria, frequency and urgency.  Musculoskeletal: Positive for back pain and joint pain.  Skin: Negative.  Negative for itching and rash.  Neurological: Negative for dizziness, tingling, focal weakness, weakness and headaches.  Endo/Heme/Allergies: Does not bruise/bleed easily.  Psychiatric/Behavioral: Negative for depression. The patient is not nervous/anxious and does not have insomnia.     PAST MEDICAL HISTORY :  Past Medical History:  Diagnosis Date  . A-fib ([place])    07/2012: A. fib with RVR in the setting of myocardial infarction. Converted to sinus rhythm with amiodarone. No recurrence.  . Anxiety   . Atrophic vaginitis   . CHF (congestive heart failure) ([place])   . Chronic combined systolic (congestive) and diastolic (congestive) heart failure ([place])    a. EF was 25-35% post MI but improved to 45-50% in 04/2013; b. 03/2017 Echo: EF 40-45%, mod focal/basal hypertrophy of septum. Sev mid-apicalanteroseptal, ant, and apical HK. Gr1 DD, mild AI/MR, mod TR, PASP 68mHg.  . Colon adenomas   . Coronary artery disease    a. [id number] STEMI complicated by cardiogenic shock. Cath/PCI: LAD 169m (DESx2), RCA 95p(DES). EF 25%; b. 03/2017 MV: EF 57%, fixed apical, periapical, mid to distal anteroseptal defect. No ischemia.  . Eczema   . Fibrocystic breast disease   . Gastritis   . GERD (gastroesophageal reflux disease)   . Glaucoma   . Headache   . History of gastritis   .  Hyperlipidemia   . Hypertension   . Hypothyroidism   . Insomnia   . Ischemic cardiomyopathy    a. 07/2012 EF 25-35% following MI-->Improved to 45-50%;  b. 03/2017 Echo: EF  40-45%.  [name] Lymphoma ([place])   . Myocardial infarction ([place]) 08/18/2014  . Non Hodgkin's lymphoma ([place]) 2005   reoccurance 2007 and 2012  . Osteoarthritis   . Osteoporosis   . Polyneuropathy   . Polyposis of colon   . Restless leg syndrome   . Urinary, incontinence, stress female     PAST SURGICAL HISTORY :   Past Surgical History:  Procedure Laterality Date  . ABDOMINAL HYSTERECTOMY  1956  . APPENDECTOMY    . AXILLARY LYMPH NODE BIOPSY Left 03/18/2015   Procedure: AXILLARY LYMPH NODE BIOPSY;  Surgeon: [name], MD;  Location: [name];  Service: General;  Laterality: Left;  . CARDIAC CATHETERIZATION  08/19/2012   [name]; [place]  . COLONOSCOPY    . COLONOSCOPY WITH PROPOFOL N/A 03/02/2016   Procedure: COLONOSCOPY WITH PROPOFOL;  Surgeon: Manya Silvas, MD;  Location[facility] END[facility]COPY;  Service: Endoscopy;  Laterality: N/A;  . COLONOSCOPY WITH PROPOFOL N/A 09/26/2017   Procedure: COLONOSCOPY WITH PROPOFOL;  Surgeon: Manya Silvas, MD;  Location: [facility] ENDOSCOPY;  Service: Endoscopy;  Laterality: N/A;  . COLONOSCOPY WITH PROPOFOL N/A 03/11/2020   Procedure: COLONOSCOPY WITH PROPOFOL;  Surgeon: Benjamine Sprague, DO;  Location: [name] ENDOSCOPY;  Service: General;  Laterality: N/A;  . CORONARY ANGIOPLASTY WITH STENT PLACEMENT     x3 stents  . CORONARY ANGIOPLASTY WITH STENT PLACEMENT    . CORONARY ARTERY BYPASS GRAFT    . ESOPHAGOGASTRODUODENOSCOPY (EGD) WITH PROPOFOL N/A 03/02/2016   Procedure: ESOPHAGOGASTRODUODENOSCOPY (EGD) WITH PROPOFOL;  Surgeon: Manya Silvas, MD;  Location: [facility] ENDOSCOPY;  Service: Endoscopy;  Laterality: N/A;  . ESOPHAGOGASTRODUODENOSCOPY (EGD) WITH PROPOFOL N/A 04/14/2019   Procedure: ESOPHAGOGASTRODUODENOSCOPY (EGD) WITH PROPOFOL;  Surgeon: Toledo, Benay Pike, MD;  Location: [name] ENDOSCOPY;  Service: Gastroenterology;  Laterality: N/A;  . LYMPH NODE BIOPSY Right 07-24-11   Dr Bary Castilla  . MOHS SURGERY     bilateral shoulders  . PTCA    . skin cancer removal     . TOTAL ABDOMINAL HYSTERECTOMY W/ BILATERAL SALPINGOOPHORECTOMY      FAMILY HISTORY :   Family History  Problem Relation Age of Onset  . Heart attack Father   . Heart disease Brother   . Leukemia Mother   . Bladder Cancer Neg Hx   . Prostate cancer Neg Hx   . Kidney cancer Neg Hx   . Breast cancer Neg Hx     SOCIAL HISTORY:   Social History   Tobacco Use  . Smoking status: Former Smoker    Types: Cigarettes    Quit date: 1950    Years since quitting: 72.1  . Smokeless tobacco: Never Used  Vaping Use  . Vaping Use: Never used  Substance Use Topics  . Alcohol use: No    Alcohol/week: 0.0 standard drinks  . Drug use: No    ALLERGIES:  is allergic to ace inhibitors, benadryl [diphenhydramine hcl], codeine, diphenhydramine, guaiacol, hydrocodone, cardizem [diltiazem], and guaifenesin & derivatives.  MEDICATIONS:  Current Outpatient Medications  Medication Sig Dispense Refill  . acetaminophen (TYLENOL) 650 MG CR tablet Take 650 mg by mouth every 8 (eight) hours as needed for pain.    [name] aspirin 81 MG tablet Take 81 mg by mouth every morning.     . carvedilol (COREG) 3.125 MG tablet  Take 1 tablet (3.125 mg total) by mouth 2 (two) times daily with a meal. 180 tablet 1  . Cholecalciferol (VITAMIN D-3) 5000 UNITS TABS Take 2,000 Int'l Units by mouth every morning.     . fexofenadine (ALLEGRA) 180 MG tablet Take 180 mg by mouth daily as needed for rhinitis.     [name] gabapentin (NEURONTIN) 100 MG capsule Take 100 mg by mouth 3 (three) times daily.    [name] latanoprost (XALATAN) 0.005 % ophthalmic solution Place 1 drop into both eyes at bedtime.    [name] LORazepam (ATIVAN) 0.5 MG tablet Take 0.5 mg by mouth every 8 (eight) hours as needed for anxiety.     [name] losartan (COZAAR) 25 MG tablet TAKE 1/2 TABLET(12.5 MG) BY MOUTH DAILY AFTER BREAKFAST 45 tablet 3  . pantoprazole (PROTONIX) 40 MG tablet TAKE 1 TABLET(40 MG) BY MOUTH TWICE DAILY 180 tablet 0  . rosuvastatin (CRESTOR) 10 MG tablet TAKE  1 TABLET(10 MG) BY MOUTH EVERY OTHER DAY 45 tablet 0  . timolol (TIMOPTIC) 0.5 % ophthalmic solution Place 1 drop into both eyes 2 (two) times daily.    . furosemide (LASIX) 20 MG tablet Take 1 tablet (20 mg total) by mouth daily as needed. (Patient not taking: Reported on 09/24/2020) 30 tablet 6   No current facility-administered medications for this visit.    PHYSICAL EXAMINATION: ECOG PERFORMANCE STATUS: 0 - Asymptomatic  BP (!) 139/50 Comment: 130/56  Pulse 70   Temp 98.7 F (37.1 C)   Resp 20   Wt 104 lb 6.4 oz (47.4 kg)   SpO2 100%   BMI 19.73 kg/m   Filed Weights   09/24/20 1451  Weight: 104 lb 6.4 oz (47.4 kg)    Physical Exam Constitutional:      Comments: She is accompanied by her son.  HENT:     Head: Normocephalic and atraumatic.     Mouth/Throat:     Pharynx: No oropharyngeal exudate.  Eyes:     Pupils: Pupils are equal, round, and reactive to light.  Cardiovascular:     Rate and Rhythm: Normal rate and regular rhythm.  Pulmonary:     Effort: No respiratory distress.     Breath sounds: No wheezing.  Abdominal:     General: Bowel sounds are normal. There is no distension.     Palpations: Abdomen is soft. There is no mass.     Tenderness: There is no abdominal tenderness. There is no guarding or rebound.  Musculoskeletal:        General: No tenderness. Normal range of motion.     Cervical back: Normal range of motion and neck supple.  Lymphadenopathy:     Comments: Bilateral axillary adenopathy 1-2 cm in size.   Skin:    General: Skin is warm.  Neurological:     Mental Status: She is alert and oriented to person, place, and time.  Psychiatric:        Mood and Affect: Affect normal.      LABORATORY DATA:  I have reviewed the data as listed    Component Value Date/Time   NA 140 09/24/2020 1417   NA 141 09/07/2014 1449   K 4.1 09/24/2020 1417   K 4.0 09/07/2014 1449   CL 105 09/24/2020 1417   CL 106 09/07/2014 1449   CO2 26 09/24/2020 1417    CO2 29 09/07/2014 1449   GLUCOSE 116 (H) 09/24/2020 1417   GLUCOSE 109 (H) 09/07/2014 1449   BUN 26 (H) 09/24/2020  1417   BUN 19 (H) 09/07/2014 1449   CREATININE 1.13 (H) 09/24/2020 1417   CREATININE 1.01 09/07/2014 1449   CALCIUM 9.2 09/24/2020 1417   CALCIUM 8.8 09/07/2014 1449   PROT 7.0 09/24/2020 1417   PROT 6.3 09/02/2015 0824   PROT 6.5 09/07/2014 1449   ALBUMIN 3.9 09/24/2020 1417   ALBUMIN 4.2 09/02/2015 0824   ALBUMIN 3.8 09/07/2014 1449   AST 16 09/24/2020 1417   AST 11 (L) 09/07/2014 1449   ALT 12 09/24/2020 1417   ALT 22 09/07/2014 1449   ALKPHOS 51 09/24/2020 1417   ALKPHOS 75 09/07/2014 1449   BILITOT 0.9 09/24/2020 1417   BILITOT 0.4 09/02/2015 0824   BILITOT 0.8 09/07/2014 1449   GFRNONAA 48 (L) 09/24/2020 1417   GFRNONAA 56 (L) 09/07/2014 1449   GFRNONAA 44 (L) 02/09/2014 1351   GFRAA 59 (L) 02/27/2020 1023   GFRAA >60 09/07/2014 1449   GFRAA 51 (L) 02/09/2014 1351    No results found for: SPEP, UPEP  Lab Results  Component Value Date   WBC 9.0 09/24/2020   NEUTROABS 3.1 09/24/2020   HGB 11.7 (L) 09/24/2020   HCT 34.2 (L) 09/24/2020   MCV 94.5 09/24/2020   PLT 139 (L) 09/24/2020      Chemistry      Component Value Date/Time   NA 140 09/24/2020 1417   NA 141 09/07/2014 1449   K 4.1 09/24/2020 1417   K 4.0 09/07/2014 1449   CL 105 09/24/2020 1417   CL 106 09/07/2014 1449   CO2 26 09/24/2020 1417   CO2 29 09/07/2014 1449   BUN 26 (H) 09/24/2020 1417   BUN 19 (H) 09/07/2014 1449   CREATININE 1.13 (H) 09/24/2020 1417   CREATININE 1.01 09/07/2014 1449      Component Value Date/Time   CALCIUM 9.2 09/24/2020 1417   CALCIUM 8.8 09/07/2014 1449   ALKPHOS 51 09/24/2020 1417   ALKPHOS 75 09/07/2014 1449   AST 16 09/24/2020 1417   AST 11 (L) 09/07/2014 1449   ALT 12 09/24/2020 1417   ALT 22 09/07/2014 1449   BILITOT 0.9 09/24/2020 1417   BILITOT 0.4 09/02/2015 0824   BILITOT 0.8 09/07/2014 1449      RADIOGRAPHIC STUDIES: I have  personally reviewed the radiological images as listed and agreed with the findings in the report. No results found.   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 Status post multiple lines of therapy in the past.  STABLE. .   # July 2021 PET scan- small lymphadenopathy above and below diaphragm.  Low FDG activity- STABLE; will repeat imaging in 6 months/July,2022.   #Again reviewed with the patient and son regarding-started chemotherapy options and also normal therapeutic options including but not limited to Gazyva lenalidomide; ibrutinib etc.  For now continue surveillance.  # Mild thrombocytopenia- 139 STABLE.  monitor for now.    # Fatigue-chronic unclear etiology-STABLE.   # DISPOSITION:  # Follow-up in approximately 6 months-MD with labs-cbc/cm/ldh; PET scan- Dr.B      Orders Placed This Encounter  Procedures  . NM PET Image Restag (PS) Skull Base To Thigh    Standing Status:   Future    Standing Expiration Date:   09/24/2021    Order Specific Question:   If indicated for the ordered procedure, I authorize the administration of a radiopharmaceutical per Radiology protocol    Answer:  Yes    Order Specific Question:   Preferred imaging location?    Answer:   [facility]  . CBC with Differential/Platelet    Standing Status:   Future    Standing Expiration Date:   09/24/2021  . Comprehensive metabolic panel    Standing Status:   Future    Standing Expiration Date:   09/24/2021  . Lactate dehydrogenase    Standing Status:   Future    Standing Expiration Date:   09/24/2021   All questions were answered. The patient knows to call the clinic with any problems, questions or concerns.      Cammie Sickle, MD 09/26/2020 8:04 AM

## 2020-09-24 NOTE — Assessment & Plan Note (Addendum)
#   LOW Grade lymphoma- B-cell non-Hodgkin's lymphoma [follicle lymphoma/marginal zone lymphoma/SLL]. Status post multiple lines of therapy in the past.  STABLE. .   # July 2021 PET scan- small lymphadenopathy above and below diaphragm.  Low FDG activity- STABLE; will repeat imaging in 6 months/July,2022.   #Again reviewed with the patient and son regarding-started chemotherapy options and also normal therapeutic options including but not limited to Gazyva lenalidomide; ibrutinib etc.  For now continue surveillance.  # Mild thrombocytopenia- 139 STABLE.  monitor for now.    # Fatigue-chronic unclear etiology-STABLE.   # DISPOSITION:  # Follow-up in approximately 6 months-MD with labs-cbc/cm/ldh; PET scan- Dr.B

## 2020-10-05 ENCOUNTER — Telehealth: Payer: Self-pay | Admitting: Cardiovascular Disease

## 2020-10-05 NOTE — Telephone Encounter (Signed)
Calling in with patient BP's as she has been experiencing lightheadedness and more tired than normal  2/5 139/59 2/6 123/64 2/7 110/59 2/8 109/62 2/9 115/62 2/10 115/65 2/11 123/64  Please advise

## 2020-10-05 NOTE — Telephone Encounter (Signed)
DPR on file.  Spoke with the patients son Marya Amsler. Marya Amsler called to report the patients BP readings below. Asked him about her HRs, He says its normally in the 70's bpm.  Adv Marya Amsler that the patients BP readings looked ok. I asked if the pt has been of any chest pain, sob, palpitations. Marya Amsler says no. Adv him to have the patient change positions slowly.  The patient was recently dx with shingles. They will continue to monitor the pt vital signs and to contact the office if symptoms worsen. Loraine Maple that I will fwd the patients readings to Dr. Fletcher Anon and will call back if he has additional recommendations.  Marya Amsler is agreeable with the plan and voiced appreciation for the call back.

## 2020-10-05 NOTE — Telephone Encounter (Signed)
Blood pressure and heart rate seem to be fine.  I doubt that her symptoms are cardiac.  She should discuss with her primary care physician.

## 2020-10-06 NOTE — Telephone Encounter (Signed)
Patients son Marya Amsler made aware of Dr. Tyrell Antonio response and recommendation. Greg voiced appreciation for the call back.

## 2020-10-08 ENCOUNTER — Ambulatory Visit (INDEPENDENT_AMBULATORY_CARE_PROVIDER_SITE_OTHER): Payer: Medicare Other | Admitting: Physician Assistant

## 2020-10-08 ENCOUNTER — Ambulatory Visit (INDEPENDENT_AMBULATORY_CARE_PROVIDER_SITE_OTHER): Payer: Medicare Other

## 2020-10-08 ENCOUNTER — Other Ambulatory Visit: Payer: Self-pay

## 2020-10-08 ENCOUNTER — Encounter: Payer: Self-pay | Admitting: Physician Assistant

## 2020-10-08 VITALS — BP 140/70 | HR 63 | Ht 60.0 in | Wt 106.0 lb

## 2020-10-08 DIAGNOSIS — I5042 Chronic combined systolic (congestive) and diastolic (congestive) heart failure: Secondary | ICD-10-CM | POA: Diagnosis not present

## 2020-10-08 DIAGNOSIS — M546 Pain in thoracic spine: Secondary | ICD-10-CM

## 2020-10-08 DIAGNOSIS — I255 Ischemic cardiomyopathy: Secondary | ICD-10-CM | POA: Diagnosis not present

## 2020-10-08 DIAGNOSIS — I10 Essential (primary) hypertension: Secondary | ICD-10-CM

## 2020-10-08 DIAGNOSIS — I480 Paroxysmal atrial fibrillation: Secondary | ICD-10-CM

## 2020-10-08 DIAGNOSIS — I272 Pulmonary hypertension, unspecified: Secondary | ICD-10-CM

## 2020-10-08 DIAGNOSIS — B029 Zoster without complications: Secondary | ICD-10-CM

## 2020-10-08 DIAGNOSIS — R0602 Shortness of breath: Secondary | ICD-10-CM | POA: Diagnosis not present

## 2020-10-08 DIAGNOSIS — E785 Hyperlipidemia, unspecified: Secondary | ICD-10-CM

## 2020-10-08 DIAGNOSIS — I25118 Atherosclerotic heart disease of native coronary artery with other forms of angina pectoris: Secondary | ICD-10-CM

## 2020-10-08 NOTE — Progress Notes (Signed)
Cardiology Office Note    Date:  10/08/2020   ID:  Jeanne Pittman, DOB 1935/01/05, MRN [id number]  PCP:  [name], MD  Cardiologist:  Kathlyn Sacramento, MD  Electrophysiologist:  None   Chief Complaint: Back pain and SOB  History of Present Illness:   Jeanne Pittman is a 86 y.o. female with history of CAD with anterior STEMI in 07/2012 with late presentation complicated by cardiogenic shock and Afib, s/p PCI/DES x2 to the LAD and RCA as outlined below, chronic combined systolic and diastolic CHF, ICM, pulmonary hypertension, non-Hodgkin's lymphoma, HTN, HLD, recurrent shingles, hypothyroidism, polyneuropathy, and GERD who presents for evaluation of back pain, shortness of breath, and generalized malaise/fatigue.   She was admitted to the hospital in 07/2012 with a late presenting anterior STEMI complicated by cardiogenic shock and Afib. Emergent LHC showed an occluded mid LAD and 95% stenosed proximal RCA. She underwent PCI/DES x 2 in the LAD and PCI/DES x 1 to the RCA. EF was 25-30% with akinesis of the mid distal anterior, apical and distal inferior wall.   She was evaluated in 07/2019 with fatigue and SOB. Echo in 07/2019 showed a stable LVEF of 40-45%. Lexiscan MPI in 08/2019 showed a mildly reduced EF with a fixed anterior apical defect consistent with prior infarct with minimal per-infarct ischemia. There was no significant change from prior test from 2018. She was admitted in 12/2019 with diverticulitis with noted significant weight loss in the setting of her illness. Echo during that admission showed an EF of 35-40%, moderate LVH, Gr1DD, severe hypokinesis of the mid-apical anteroseptal and apical segment, elevated PASP, normal RVSF and ventricular cavity size, mild MR, moderate to severe TR, mild AI, and mild aortic valve sclerosis without stenosis.  Initial EKG during that admission consistent with A. fib with RVR with discharge summary indicating the patient converted to sinus  rhythm with amiodarone and rate control strategy with follow-up EKG demonstrating sinus rhythm.  She was last seen in the office in 04/2020 by Dr. Fletcher Anon at which time she was doing well from a cardiac perspective and was in sinus rhythm.  She has a history of recurrent shingles, most recently diagnosed on 10/04/2020. She contacted our office on 2/15 with BP readings and concerns regarding this, with symptoms not felt to be cardiac and she was advised to follow up with her PCP. She was seen at her PCP's office on 10/04/2020 and diagnosed with recurrent shingles involving the right lower abdomen and flank region and was placed on Valtrex, gabapentin, and ibuprofen.   She comes in accompanied by her son today.  He has noted for the past several weeks a generalized decline in her functional status with associated malaise and fatigue.  She was recently diagnosed with shingles as outlined above.  In this setting she began to communicate with her son that she had been having a bandlike back pain in the distribution of her shoulder blades that was exertional and would improve with ibuprofen use.  There is a history of exertional shortness of breath that improves with rest which is largely unchanged from baseline.  She did have an episode of chest pain several weeks ago when laying down for bed which lasted for an hour or two followed by spontaneous resolution.  She has been chest pain-free since.  She denies any dizziness, presyncope, syncope, palpitations, or lower extremity swelling.  She indicates she does take her Lasix on a daily basis as directed by her PCP.  She does at times feel unsteady when she is standing up from working in her garden outdoors though has not had any falls in the past 12 months.   Labs independently reviewed: 09/2020 - HGB 11.7, PLT 139, potassium 4.1, BUN 26, SCr 1.13, albumin 3.9, AST/ALT normal 07/2020 TSH normal 04/2020 - TC 137, TG 113, HDL 42, LDL 72 01/2020 - magnesium 1.8  Past  Medical History:  Diagnosis Date  . A-fib ([place])    07/2012: A. fib with RVR in the setting of myocardial infarction. Converted to sinus rhythm with amiodarone. No recurrence.  . Anxiety   . Atrophic vaginitis   . Chronic combined systolic (congestive) and diastolic (congestive) heart failure ([place])    a. EF was 25-35% post MI but improved to 45-50% in 04/2013; b. 03/2017 Echo: EF 40-45%, mod focal/basal hypertrophy of septum. Sev mid-apicalanteroseptal, ant, and apical HK. Gr1 DD, mild AI/MR, mod TR, PASP 35mHg.  . Colon adenomas   . Coronary artery disease    a. 41/3244 STEMI complicated by cardiogenic shock. Cath/PCI: LAD 135m (DESx2), RCA 95p(DES). EF 25%; b. 03/2017 MV: EF 57%, fixed apical, periapical, mid to distal anteroseptal defect. No ischemia.  . Eczema   . Fibrocystic breast disease   . Gastritis   . GERD (gastroesophageal reflux disease)   . Glaucoma   . Headache   . Hyperlipidemia   . Hypertension   . Hypothyroidism   . Insomnia   . Ischemic cardiomyopathy    a. 07/2012 EF 25-35% following MI-->Improved to 45-50%;  b. 03/2017 Echo: EF 40-45%.  . Myocardial infarction ([place]) 08/18/2014  . Non Hodgkin's lymphoma ([place]) 2005   reoccurance 2007 and 2012  . Osteoarthritis   . Osteoporosis   . Polyneuropathy   . Polyposis of colon   . Restless leg syndrome   . Shingles    right  . Urinary, incontinence, stress female     Past Surgical History:  Procedure Laterality Date  . ABDOMINAL HYSTERECTOMY  1956  . APPENDECTOMY    . AXILLARY LYMPH NODE BIOPSY Left 03/18/2015   Procedure: AXILLARY LYMPH NODE BIOPSY;  Surgeon: [name], MD;  Location: [name];  Service: General;  Laterality: Left;  . CARDIAC CATHETERIZATION  08/19/2012   [name]; [place]  . COLONOSCOPY    . COLONOSCOPY WITH PROPOFOL N/A 03/02/2016   Procedure: COLONOSCOPY WITH PROPOFOL;  Surgeon: Manya Silvas, MD;  Location: [facility] ENDOSCOPY;  Service: Endoscopy;  Laterality: N/A;  . COLONOSCOPY WITH PROPOFOL  N/A 09/26/2017   Procedure: COLONOSCOPY WITH PROPOFOL;  Surgeon: Manya Silvas, MD;  Location: [facility] ENDOSCOPY;  Service: Endoscopy;  Laterality: N/A;  . COLONOSCOPY WITH PROPOFOL N/A 03/11/2020   Procedure: COLONOSCOPY WITH PROPOFOL;  Surgeon: Benjamine Sprague, DO;  Location: [name] ENDOSCOPY;  Service: General;  Laterality: N/A;  . CORONARY ANGIOPLASTY WITH STENT PLACEMENT     x3 stents  . CORONARY ANGIOPLASTY WITH STENT PLACEMENT    . CORONARY ARTERY BYPASS GRAFT    . ESOPHAGOGASTRODUODENOSCOPY (EGD) WITH PROPOFOL N/A 03/02/2016   Procedure: ESOPHAGOGASTRODUODENOSCOPY (EGD) WITH PROPOFOL;  Surgeon: Manya Silvas, MD;  Location[facility] ENDOSCOPY;  Service: Endoscopy;  Laterality: N/A;  . ESOPHAGOGASTRODUODENOSCOPY (EGD) WITH PROPOFOL N/A 04/14/2019   Procedure: ESOPHAGOGASTRODUODENOSCOPY (EGD) WITH PROPOFOL;  Surgeon: Toledo, Benay Pike, MD;  Location: [name] ENDOSCOPY;  Service: Gastroenterology;  Laterality: N/A;  . LYMPH NODE BIOPSY Right 07-24-11   Dr Bary Castilla  . MOHS SURGERY     bilateral shoulders  . PTCA    .  skin cancer removal    . TOTAL ABDOMINAL HYSTERECTOMY W/ BILATERAL SALPINGOOPHORECTOMY      Current Medications: Current Meds  Medication Sig  . acetaminophen (TYLENOL) 650 MG CR tablet Take 650 mg by mouth every 8 (eight) hours as needed for pain.  [name] aspirin 81 MG tablet Take 81 mg by mouth every morning.   . carvedilol (COREG) 3.125 MG tablet Take 1 tablet (3.125 mg total) by mouth 2 (two) times daily with a meal.  . Cholecalciferol (VITAMIN D-3) 5000 UNITS TABS Take 2,000 Int'l Units by mouth every morning.   . fexofenadine (ALLEGRA) 180 MG tablet Take 180 mg by mouth daily as needed for rhinitis.   . furosemide (LASIX) 20 MG tablet Take 1 tablet (20 mg total) by mouth daily as needed.  . gabapentin (NEURONTIN) 100 MG capsule Take 100 mg by mouth 3 (three) times daily.  [name] ibuprofen (ADVIL) 800 MG tablet Take 800 mg by mouth every 6 (six) hours as needed.  . latanoprost (XALATAN)  0.005 % ophthalmic solution Place 1 drop into both eyes at bedtime.  [name] LORazepam (ATIVAN) 0.5 MG tablet Take 0.5 mg by mouth every 8 (eight) hours as needed for anxiety.   [name] losartan (COZAAR) 25 MG tablet TAKE 1/2 TABLET(12.5 MG) BY MOUTH DAILY AFTER BREAKFAST  . pantoprazole (PROTONIX) 40 MG tablet TAKE 1 TABLET(40 MG) BY MOUTH TWICE DAILY  . rosuvastatin (CRESTOR) 10 MG tablet TAKE 1 TABLET(10 MG) BY MOUTH EVERY OTHER DAY  . timolol (TIMOPTIC) 0.5 % ophthalmic solution Place 1 drop into both eyes 2 (two) times daily.  . valACYclovir (VALTREX) 1000 MG tablet Take 1,000 mg by mouth 3 (three) times daily.    Allergies:   Ace inhibitors, Benadryl [diphenhydramine hcl], Codeine, Diphenhydramine, Guaiacol, Hydrocodone, Cardizem [diltiazem], and Guaifenesin & derivatives   Social History   Socioeconomic History  . Marital status: Divorced    Spouse name: Not on file  . Number of children: Not on file  . Years of education: Not on file  . Highest education level: Not on file  Occupational History  . Not on file  Tobacco Use  . Smoking status: Former Smoker    Types: Cigarettes    Quit date: 1950    Years since quitting: 72.1  . Smokeless tobacco: Never Used  Vaping Use  . Vaping Use: Never used  Substance and Sexual Activity  . Alcohol use: No    Alcohol/week: 0.0 standard drinks  . Drug use: No  . Sexual activity: Yes    Birth control/protection: Surgical  Other Topics Concern  . Not on file  Social History Narrative  . Not on file   Social Determinants of Health   Financial Resource Strain: Not on file  Food Insecurity: Not on file  Transportation Needs: Not on file  Physical Activity: Not on file  Stress: Not on file  Social Connections: Not on file     Family History:  The patient's family history includes Heart attack in her father; Heart disease in her brother; Leukemia in her mother. There is no history of Bladder Cancer, Prostate cancer, Kidney cancer, or Breast  cancer.  ROS:   Review of Systems  Constitutional: Positive for malaise/fatigue. Negative for chills, diaphoresis, fever and weight loss.  HENT: Negative for congestion.   Eyes: Negative for discharge and redness.  Respiratory: Positive for shortness of breath. Negative for cough, sputum production and wheezing.   Cardiovascular: Positive for chest pain. Negative for palpitations, orthopnea, claudication, leg swelling  and PND.  Gastrointestinal: Negative for abdominal pain, heartburn, nausea and vomiting.  Musculoskeletal: Positive for back pain. Negative for falls and myalgias.  Skin: Positive for rash.  Neurological: Positive for weakness. Negative for dizziness, tingling, tremors, sensory change, speech change, focal weakness and loss of consciousness.  Endo/Heme/Allergies: Does not bruise/bleed easily.  Psychiatric/Behavioral: Negative for substance abuse. The patient is not nervous/anxious.   All other systems reviewed and are negative.    EKGs/Labs/Other Studies Reviewed:    Studies reviewed were summarized above. The additional studies were reviewed today:  2D echo 01/2020: 1. Left ventricular ejection fraction, by estimation, is 35 to 40%. The  left ventricle has moderately decreased function. The left ventricle  demonstrates regional wall motion abnormalities (see scoring  diagram/findings for description). There is  moderate left ventricular hypertrophy. Left ventricular diastolic  parameters are consistent with Grade I diastolic dysfunction (impaired  relaxation). There is severe hypokinesis of the left ventricular,  mid-apical anteroseptal wall and apical segment.  2. There is at least mild pulmonary hypertension (PASP 35 mmHg plus  central venous pressure). Right ventricular systolic function is normal.  The right ventricular size is normal.  3. The mitral valve is grossly normal. Mild mitral valve regurgitation.  No evidence of mitral stenosis.  4. Tricuspid valve  regurgitation is moderate to severe.  5. The aortic valve is tricuspid. Aortic valve regurgitation is mild.  Mild aortic valve sclerosis is present, with no evidence of aortic valve  Stenosis. __________  2D echo 07/2019: 1. Left ventricular ejection fraction, by visual estimation, is 40 to  45%. The left ventricle has mild to moderately decreased function. There  is no left ventricular hypertrophy.  2. Left ventricular diastolic parameters are indeterminate.  3. The left ventricle demonstrates regional wall motion abnormalities.  4. There is mid to apical anteroseptal akinesis and apical akinesis.   proximal/basal septal bulge noted, measuring 1.6 cm.  5. Global right ventricle has normal systolic function.The right  ventricular size is normal. Right vetricular wall thickness was not  assessed.  6. Left atrial size was normal.  7. Right atrial size was normal.  8. The pericardium was not assessed.  9. The mitral valve is normal in structure. Trivial mitral valve  regurgitation.  10. The tricuspid valve is normal in structure.  11. The aortic valve is tricuspid. Aortic valve regurgitation is trivial.  12. The pulmonic valve was not well visualized. Pulmonic valve  regurgitation is trivial. __________  2D echo 03/2017: - Left ventricle: The cavity size was normal. There was moderate  focal basal hypertrophy of the septum. Systolic function was  mildly to moderately reduced. The estimated ejection fraction was  in the range of 40% to 45%. Severe hypokinesis of the  mid-apicalanteroseptal, anterior, and apical myocardium. Doppler  parameters are consistent with abnormal left ventricular  relaxation (grade 1 diastolic dysfunction).  - Aortic valve: There was mild regurgitation.  - Mitral valve: There was mild regurgitation.  - Tricuspid valve: There was moderate regurgitation.  - Pulmonary arteries: Systolic pressure was mildly increased. PA  peak  pressure: 40 mm Hg (S). __________  2D echo 04/2013: - Left ventricle: The cavity size was normal. There was mild  focal basal hypertrophy of the septum. Systolic function  was mildly reduced. The estimated ejection fraction was in  the range of 45% to 50%. Hypokinesis of the anteroseptal  myocardium. Hypokinesis of the anterior myocardium.  Hypokinesis of the apical myocardium. Doppler parameters  are consistent with abnormal left ventricular  relaxation  (grade 1 diastolic dysfunction).  - Aortic valve: Mild regurgitation.  - Right ventricle: Systolic function was normal.  - Tricuspid valve: Moderate regurgitation.  - Pulmonary arteries: Systolic pressure was moderately  elevated. PA peak pressure: 75mm Hg (S).  __________  2D echo 09/2012: - Left ventricle: The cavity size was normal. There was  focal basal and mild concentric hypertrophy. Systolic  function was moderately reduced. The estimated ejection  fraction was in the range of 35% to 40%. There is severe  hypokinesis of the mid-distalanterior and apical  myocardium. Doppler parameters are consistent with  abnormal left ventricular relaxation (grade 1 diastolic  dysfunction).  - Aortic valve: Mild regurgitation.  - Tricuspid valve: Moderate regurgitation.  - Pulmonary arteries: Systolic pressure was mildly  increased. PA peak pressure: 33mm Hg (S). __________  [facility] 07/2012: Proximal LAD 20%, mid LAD 100% s/p PCI/DES x 2, proximal RCA 95% s/p PCI/DES x 1   EKG:  EKG is ordered today.  The EKG ordered today demonstrates NSR, 63 bpm, left axis deviation, prior inferior and anterior infarcts, lateral ST-T changes, no significant changes when compared to prior tracing, baseline artifact  Recent Labs: 01/19/2020: TSH 1.471 01/24/2020: Magnesium 1.8 09/24/2020: ALT 12; BUN 26; Creatinine, Ser 1.13; Hemoglobin 11.7; Platelets 139; Potassium 4.1; Sodium 140  Recent Lipid Panel    Component Value  Date/Time   CHOL 172 09/02/2015 0824   TRIG 111 09/02/2015 0824   HDL 51 09/02/2015 0824   CHOLHDL 3.4 09/02/2015 0824   LDLCALC 99 09/02/2015 0824    PHYSICAL EXAM:    VS:  BP 140/70 (BP Location: Left Arm, Patient Position: Sitting, Cuff Size: Normal)   Pulse 63   Ht 5' (1.524 m)   Wt 106 lb (48.1 kg)   SpO2 98%   BMI 20.70 kg/m   BMI: Body mass index is 20.7 kg/m.  Physical Exam Constitutional:      Appearance: She is well-developed and well-nourished.  HENT:     Head: Normocephalic and atraumatic.  Eyes:     General:        Right eye: No discharge.        Left eye: No discharge.  Neck:     Vascular: No JVD.  Cardiovascular:     Rate and Rhythm: Normal rate and regular rhythm.     Pulses: No midsystolic click and no opening snap.          Posterior tibial pulses are 2+ on the right side and 2+ on the left side.     Heart sounds: S1 normal and S2 normal. Heart sounds not distant. Murmur heard.   Systolic murmur is present with a grade of 2/6 at the upper left sternal border. No friction rub.  Pulmonary:     Effort: Pulmonary effort is normal. No respiratory distress.     Breath sounds: Normal breath sounds. No decreased breath sounds, wheezing or rales.  Chest:     Chest wall: No tenderness.  Abdominal:     General: There is no distension.     Palpations: Abdomen is soft.     Tenderness: There is no abdominal tenderness.  Musculoskeletal:        General: No edema.     Cervical back: Normal range of motion.  Skin:    General: Skin is warm and dry.     Findings: Rash present.     Nails: There is no clubbing or cyanosis.       Neurological:  Mental Status: She is alert and oriented to person, place, and time.  Psychiatric:        Mood and Affect: Mood and affect normal.        Speech: Speech normal.        Behavior: Behavior normal.        Thought Content: Thought content normal.        Judgment: Judgment normal.     Wt Readings from Last 3  Encounters:  10/08/20 106 lb (48.1 kg)  09/24/20 104 lb 6.4 oz (47.4 kg)  05/13/20 100 lb (45.4 kg)     ASSESSMENT & PLAN:   1. CAD involving the native coronary arteries with other forms of angina/back pain: Currently chest pain-free.  She does continue to note a bandlike back pain that radiates along her shoulder blades.  EKG is unchanged when compared to prior tracing.  Initially we will begin with an echo as outlined below to trend her cardiomyopathy.  If this is found to be worsening we would plan for a diagnostic cardiac cath.  If her cardiomyopathy is improved and closer to baseline we may consider pursuing noninvasive ischemic testing with a Lexiscan MPI.  Continue secondary prevention and current medical therapy including aspirin, carvedilol, losartan, and rosuvastatin.  2. Chronic combined systolic and diastolic CHF/ICM/pulmonary hypertension/SOB: She appears euvolemic and well compensated with NYHA class III symptoms.  Continue current GDMT including carvedilol, losartan, and furosemide.  Most recent echo in 01/2020 showed a slight drop in her LV systolic function with an EF of 35-40% with prior echo in 07/2019 demonstrating an EF of 40-45%.  With her ongoing exertional dyspnea we will trend her echo to evaluate her cardiomyopathy.  Based on these results we may consider Lexiscan MPI to evaluate for high risk ischemia as outlined above versus proceeding directly with a diagnostic cardiac cath if a worsening cardiomyopathy is noted.  Based on echo results would look to further optimize GDMT with possible addition of MRA, SGLT2 inhibitor, and transition to [place].  3. PAF: She was initially noted to have A. fib in the setting of her STEMI in 07/2012.  Given this was a lone episode anticoagulation was not pursued.  During recent admission in 12/2019 for diverticulitis and volume depletion, her initial EKG was notable for A. fib with RVR which discharge summary indicating this was felt to be in  the setting of her acute illness.  At that time she was treated with amiodarone, diltiazem, and Lopressor.  Follow-up EKG during that admission showed sinus rhythm.  She was noted to be in sinus rhythm during her most recent visit in our office in 04/2020.  She has had 2 episodes of documented A. fib noted during hospital admissions with significant acute illnesses.  Currently maintaining sinus rhythm.she remains on carvedilol given her underlying cardiomyopathy.  CHA2DS2-VASc at least 6 (CHF, HTN, age x2, vascular disease, sex category).  We will send her home with a ZIO XT patch to be placed in approximately 10 days following improvement of her shingles rash to evaluate her A. fib burden with recommendation to avoid anticoagulation unless further A. fib is seen given her frail state and in the context of her prior episodes of A. fib being noted during acute illness.  Reviewed with her primary cardiologist.   4. HTN: Blood pressure is mildly elevated in the office today, likely in the setting of her active shingles.  She remains on carvedilol and losartan.  Medication changes were deferred at today's visit.  Her BP can be reassessed in follow-up.  5. HLD: LDL 72 in 04/2020 with triglycerides of 113 at that time.  She remains on rosuvastatin with every other day dosing with tolerance.  6. Recurrent shingles with associated generalized malaise and fatigue: Continue ongoing management and follow-up per PCP.  Gabapentin may be contributing to her fatigue.  Consider tapering off ibuprofen use as soon as possible.  Disposition: F/u with Dr. Fletcher Anon or an APP in 1 month.   Medication Adjustments/Labs and Tests Ordered: Current medicines are reviewed at length with the patient today.  Concerns regarding medicines are outlined above. Medication changes, Labs and Tests ordered today are summarized above and listed in the Patient Instructions accessible in Encounters.   Signed, Christell Faith, PA-C 10/08/2020 4:27 PM      [facility] [address] [place], [place] [address] [phone]

## 2020-10-08 NOTE — Patient Instructions (Signed)
Medication Instructions:   NONE  *If you need a refill on your cardiac medications before your next appointment, please call your pharmacy*   Lab Work:  NONE  If you have labs (blood work) drawn today and your tests are completely normal, you will receive your results only by: [name] MyChart Message (if you have MyChart) OR . A paper copy in the mail If you have any lab test that is abnormal or we need to change your treatment, we will call you to review the results.   Testing/Procedures: Your physician has recommended that you wear a Zio monitor. This monitor is a medical device that records the heart's electrical activity. Doctors most often use these monitors to diagnose arrhythmias. Arrhythmias are problems with the speed or rhythm of the heartbeat. The monitor is a small device applied to your chest. You can wear one while you do your normal daily activities. While wearing this monitor if you have any symptoms to push the button and record what you felt. Once you have worn this monitor for the period of time provider prescribed (Usually 14 days), you will return the monitor device in the postage paid box. Once it is returned they will download the data collected and provide [place] with a report which the provider will then review and we will call you with those results. Important tips:  1. Avoid showering during the first 24 hours of wearing the monitor. 2. Avoid excessive sweating to help maximize wear time. 3. Do not submerge the device, no hot tubs, and no swimming pools. 4. Keep any lotions or oils away from the patch. 5. After 24 hours you may shower with the patch on. Take brief showers with your back facing the shower head.  6. Do not remove patch once it has been placed because that will interrupt data and decrease adhesive wear time. 7. Push the button when you have any symptoms and write down what you were feeling. 8. Once you have completed wearing your monitor, remove and place  into box which has postage paid and place in your outgoing mailbox.  9. If for some reason you have misplaced your box then call our office and we can provide another box and/or mail it off for you.      Your physician has requested that you have an echocardiogram. Echocardiography is a painless test that uses sound waves to create images of your heart. It provides your doctor with information about the size and shape of your heart and how well your heart's chambers and valves are working. This procedure takes approximately one hour. There are no restrictions for this procedure.     Follow-Up: At [facility], you and your health needs are our priority.  As part of our continuing mission to provide you with exceptional heart care, we have created designated Provider Care Teams.  These Care Teams include your primary Cardiologist (physician) and Advanced Practice Providers (APPs -  Physician Assistants and Nurse Practitioners) who all work together to provide you with the care you need, when you need it.  We recommend signing up for the patient portal called "MyChart".  Sign up information is provided on this After Visit Summary.  MyChart is used to connect with patients for Virtual Visits (Telemedicine).  Patients are able to view lab/test results, encounter notes, upcoming appointments, etc.  Non-urgent messages can be sent to your provider as well.   To learn more about what you can do with MyChart, go to [email or web address].  Your next appointment:   1 month(s)  The format for your next appointment:   In Person  Provider:   You may see Kathlyn Sacramento, MD or one of the following Advanced Practice Providers on your designated Care Team:    Murray Hodgkins, NP  Christell Faith, PA-C  Marrianne Mood, PA-C  Cadence Kathlen Mody, [place]  Laurann Montana, NP    Other Instructions  Please place zio monitor on in 10 days. Monitor will be mailed by [facility].

## 2020-10-09 ENCOUNTER — Other Ambulatory Visit: Payer: Self-pay | Admitting: Cardiovascular Disease

## 2020-10-12 ENCOUNTER — Other Ambulatory Visit: Payer: Self-pay | Admitting: *Deleted

## 2020-10-12 MED ORDER — FUROSEMIDE 20 MG PO TABS: 20.0000 mg | ORAL_TABLET | Freq: Every day | ORAL | 3 refills | Status: DC | PRN

## 2020-10-13 ENCOUNTER — Other Ambulatory Visit (HOSPITAL_COMMUNITY): Payer: Self-pay | Admitting: Internal Medicine

## 2020-10-13 ENCOUNTER — Other Ambulatory Visit: Payer: Self-pay | Admitting: Internal Medicine

## 2020-10-13 DIAGNOSIS — R1310 Dysphagia, unspecified: Secondary | ICD-10-CM

## 2020-10-21 ENCOUNTER — Telehealth: Payer: Self-pay | Admitting: Internal Medicine

## 2020-10-21 NOTE — Telephone Encounter (Signed)
On 3/01-I spoke with patient's son Oluwatosin Bracy regarding his concern for smudge cells/slightly elevated lymphocyte count.  Discussed the possibilities of progressive disease vs-leukemoid reaction from recent shingles. .  I offered to evaluate the patient sooner.  However, patient has an upcoming follow-up with PCP soon. patient son will inform us-regarding the need for closer follow with [place], after the visit with PCP.

## 2020-10-22 ENCOUNTER — Other Ambulatory Visit: Payer: Self-pay

## 2020-10-22 ENCOUNTER — Other Ambulatory Visit: Payer: Self-pay | Admitting: Internal Medicine

## 2020-10-22 ENCOUNTER — Ambulatory Visit
Admission: RE | Admit: 2020-10-22 | Discharge: 2020-10-22 | Disposition: A | Discharge status: Home / Self Care | Payer: Medicare Other | Source: Ambulatory Visit | Attending: Internal Medicine | Admitting: Internal Medicine

## 2020-10-22 DIAGNOSIS — R1310 Dysphagia, unspecified: Secondary | ICD-10-CM

## 2020-10-28 DIAGNOSIS — I480 Paroxysmal atrial fibrillation: Secondary | ICD-10-CM | POA: Diagnosis not present

## 2020-10-29 ENCOUNTER — Telehealth: Payer: Self-pay | Admitting: Nurse Practitioner

## 2020-10-29 ENCOUNTER — Ambulatory Visit (INDEPENDENT_AMBULATORY_CARE_PROVIDER_SITE_OTHER): Payer: Medicare Other

## 2020-10-29 ENCOUNTER — Other Ambulatory Visit: Payer: Self-pay

## 2020-10-29 DIAGNOSIS — R0602 Shortness of breath: Secondary | ICD-10-CM

## 2020-10-29 MED ORDER — PERFLUTREN LIPID MICROSPHERE
1.0000 mL | INTRAVENOUS | Status: AC | PRN
  Administered 2020-10-29: 2 mL via INTRAVENOUS

## 2020-10-29 NOTE — Telephone Encounter (Signed)
Patient's son stopped at reading room door when he came to get his mom from echo room to ask how it affects her to miss her night time dose of carvedilol. He states she frequently forgets to take the carvedilol in the evenings when he is not with her. I explained the effect that omitting the evening dose of carvedilol can have and advised that since patient is here getting an echo and seeing Dr. Fletcher Anon in two weeks, that I will forward a message to him for his awareness. The patient and son are aware of the plan and verbalized agreement. They thanked me for my help.

## 2020-10-29 NOTE — Telephone Encounter (Signed)
Ok thanks 

## 2020-11-02 LAB — ECHOCARDIOGRAM COMPLETE
AR max vel: 1.85 cm2
AV Area VTI: 1.75 cm2
AV Area mean vel: 2.01 cm2
AV Mean grad: 2 mmHg
AV Peak grad: 5.1 mmHg
Ao pk vel: 1.13 m/s
Area-P 1/2: 3.43 cm2

## 2020-11-05 ENCOUNTER — Telehealth: Payer: Self-pay | Admitting: *Deleted

## 2020-11-05 MED ORDER — CARVEDILOL 3.125 MG PO TABS: 6.2500 mg | ORAL_TABLET | Freq: Two times a day (BID) | ORAL | 1 refills | Status: DC

## 2020-11-05 NOTE — Telephone Encounter (Signed)
Spoke with patient to review her results. She stopped me and stated she would be in next week to see provider here in office. Advised that was fine but they did want her to increase her carvedilol. She reports just picking up prescription and wanted to know if she could use that up first. Instructed her to take Carvedilol 3.125 mg and take 2 tablets (6.25 mg) twice a day. She verbalized understanding of increase in medication and confirmed upcoming appointment. No further questions at this time.

## 2020-11-05 NOTE — Telephone Encounter (Signed)
-----   Message from Rise Mu, PA-C sent at 11/05/2020  1:58 PM EDT ----- Cardiac monitor showed a predominant rhythm of sinus with an average rate of 73 bpm (range 51-179 bpm), 21 episodes of SVT with the fastest interval lasting 5 beats with a maximum rate of 179 bpm and the longest interval lasting 14 beats with an average rate of 117 bpm. There were rare PACs and PVCs.   Recommendations: -Please have her increase Coreg to 6.25 mg bid from 3.125 mg bid  -Follow up as planned next week to reassess symptoms and discuss further medication management

## 2020-11-08 ENCOUNTER — Telehealth: Payer: Self-pay | Admitting: Cardiovascular Disease

## 2020-11-08 MED ORDER — CARVEDILOL 3.125 MG PO TABS: 6.2500 mg | ORAL_TABLET | Freq: Two times a day (BID) | ORAL | 1 refills | Status: DC

## 2020-11-08 NOTE — Telephone Encounter (Signed)
Rx request sent to pharmacy.  

## 2020-11-08 NOTE — Telephone Encounter (Signed)
*  STAT* If patient is at the pharmacy, call can be transferred to refill team.   1. Which medications need to be refilled? (please list name of each medication and dose if known) carvedilol 3.125 2 tablets bid  2. Which pharmacy/location (including street and city if local pharmacy) is medication to be sent to? [facility] on [address]  3. Do they need a 30 day or 90 day supply? [place]

## 2020-11-11 ENCOUNTER — Encounter: Payer: Self-pay | Admitting: Cardiovascular Disease

## 2020-11-11 ENCOUNTER — Ambulatory Visit: Payer: Medicare Other | Admitting: Cardiovascular Disease

## 2020-11-11 ENCOUNTER — Other Ambulatory Visit: Payer: Self-pay

## 2020-11-11 ENCOUNTER — Ambulatory Visit (INDEPENDENT_AMBULATORY_CARE_PROVIDER_SITE_OTHER): Payer: Medicare Other | Admitting: Cardiovascular Disease

## 2020-11-11 VITALS — BP 122/62 | HR 71 | Ht 60.0 in | Wt 105.0 lb

## 2020-11-11 DIAGNOSIS — I255 Ischemic cardiomyopathy: Secondary | ICD-10-CM | POA: Diagnosis not present

## 2020-11-11 DIAGNOSIS — I2510 Atherosclerotic heart disease of native coronary artery without angina pectoris: Secondary | ICD-10-CM | POA: Diagnosis not present

## 2020-11-11 DIAGNOSIS — E785 Hyperlipidemia, unspecified: Secondary | ICD-10-CM

## 2020-11-11 MED ORDER — CARVEDILOL 6.25 MG PO TABS: 6.2500 mg | ORAL_TABLET | Freq: Two times a day (BID) | ORAL | 1 refills | Status: DC

## 2020-11-11 NOTE — Progress Notes (Signed)
Cardiology Office Note   Date:  11/11/2020   ID:  Jeanne Pittman, DOB 1934/09/26, MRN [id number]  PCP:  [name], MD  Cardiologist:   Kathlyn Sacramento, MD   Chief Complaint  Patient presents with  . Follow-up    1 month---after ZIO and echo      History of Present Illness: Jeanne Pittman is a 86 y.o. female who presents for a followup visit regarding coronary artery disease and chronic systolic heart failure due to ischemic cardiomyopathy. She had anterior ST elevation myocardial infarction in December 2013 with late presentation complicated by cardiogenic shock. Emergent cardiac catheterization showed an occluded mid LAD and 95% stenosis in the proximal RCA. 2 drug-eluting stents were placed in the mid LAD and 1 drug-eluting stent to the proximal RCA. Ejection fraction was 25-30% with akinesis of the mid distal anterior, apical and distal inferior wall.  She has known history of non-Hodgkin's lymphoma which is being observed. She has known history of myalgia with atorvastatin but she has been tolerating rosuvastatin.  She had cardiac work-up in 2020.  Echocardiogram showed stable EF of 40 to 45%.  Lexiscan Myoview showed mildly reduced ejection fraction with fixed anterior apical defect consistent with prior infarct with minimal peri-infarct ischemia.  No significant change from previous test in 2018.  She was hospitalized in May 2021 with diverticulitis and improved with treatment.  She did have A. fib with RVR on presentation but converted to sinus rhythm.    She had shingles in February.  She was seen by Christell Faith in February for fatigue and generalized decline in functional status.  She did report some chest pain and shortness of breath.  She had an echocardiogram done which showed an EF of 35 to 40% with mild mitral regurgitation and moderate tricuspid regurgitation.  Outpatient ZIO monitor showed sinus rhythm with short runs of SVT.  Average heart rate was 73 bpm.  No  evidence of atrial fibrillation.  The dose of carvedilol was increased to 6.25 mg twice daily.  She continues to struggle with shingles.  She was placed on gabapentin but had significant side effects and had to be discontinued.  She was having weight loss and thus furosemide was discontinued due to volume depletion noted on her labs.  She has dysphagia and might need esophageal dilatation in the near future.  Past Medical History:  Diagnosis Date  . A-fib ([place])    07/2012: A. fib with RVR in the setting of myocardial infarction. Converted to sinus rhythm with amiodarone. No recurrence.  . Anxiety   . Atrophic vaginitis   . Chronic combined systolic (congestive) and diastolic (congestive) heart failure ([place])    a. EF was 25-35% post MI but improved to 45-50% in 04/2013; b. 03/2017 Echo: EF 40-45%, mod focal/basal hypertrophy of septum. Sev mid-apicalanteroseptal, ant, and apical HK. Gr1 DD, mild AI/MR, mod TR, PASP 76mHg.  . Colon adenomas   . Coronary artery disease    a. 47/8295 STEMI complicated by cardiogenic shock. Cath/PCI: LAD 118m (DESx2), RCA 95p(DES). EF 25%; b. 03/2017 MV: EF 57%, fixed apical, periapical, mid to distal anteroseptal defect. No ischemia.  . Eczema   . Fibrocystic breast disease   . Gastritis   . GERD (gastroesophageal reflux disease)   . Glaucoma   . Headache   . Hyperlipidemia   . Hypertension   . Hypothyroidism   . Insomnia   . Ischemic cardiomyopathy    a. 07/2012 EF 25-35% following MI-->Improved  to 45-50%;  b. 03/2017 Echo: EF 40-45%.  . Myocardial infarction ([place]) 08/18/2014  . Non Hodgkin's lymphoma ([place]) 2005   reoccurance 2007 and 2012  . Osteoarthritis   . Osteoporosis   . Polyneuropathy   . Polyposis of colon   . Restless leg syndrome   . Shingles    right  . Urinary, incontinence, stress female     Past Surgical History:  Procedure Laterality Date  . ABDOMINAL HYSTERECTOMY  1956  . APPENDECTOMY    . AXILLARY LYMPH NODE BIOPSY Left  03/18/2015   Procedure: AXILLARY LYMPH NODE BIOPSY;  Surgeon: [name], MD;  Location: [name];  Service: General;  Laterality: Left;  . CARDIAC CATHETERIZATION  08/19/2012   [name]; Nixon Sparr  . COLONOSCOPY    . COLONOSCOPY WITH PROPOFOL N/A 03/02/2016   Procedure: COLONOSCOPY WITH PROPOFOL;  Surgeon: Manya Silvas, MD;  Location: [facility] ENDOSCOPY;  Service: Endoscopy;  Laterality: N/A;  . COLONOSCOPY WITH PROPOFOL N/A 09/26/2017   Procedure: COLONOSCOPY WITH PROPOFOL;  Surgeon: Manya Silvas, MD;  Location: [facility] ENDOSCOPY;  Service: Endoscopy;  Laterality: N/A;  . COLONOSCOPY WITH PROPOFOL N/A 03/11/2020   Procedure: COLONOSCOPY WITH PROPOFOL;  Surgeon: Benjamine Sprague, DO;  Location: [name] ENDOSCOPY;  Service: General;  Laterality: N/A;  . CORONARY ANGIOPLASTY WITH STENT PLACEMENT     x3 stents  . CORONARY ANGIOPLASTY WITH STENT PLACEMENT    . CORONARY ARTERY BYPASS GRAFT    . ESOPHAGOGASTRODUODENOSCOPY (EGD) WITH PROPOFOL N/A 03/02/2016   Procedure: ESOPHAGOGASTRODUODENOSCOPY (EGD) WITH PROPOFOL;  Surgeon: Manya Silvas, MD;  Location: [facility] ENDOSCOPY;  Service: Endoscopy;  Laterality: N/A;  . ESOPHAGOGASTRODUODENOSCOPY (EGD) WITH PROPOFOL N/A 04/14/2019   Procedure: ESOPHAGOGASTRODUODENOSCOPY (EGD) WITH PROPOFOL;  Surgeon: Toledo, Benay Pike, MD;  Location: [name] ENDOSCOPY;  Service: Gastroenterology;  Laterality: N/A;  . LYMPH NODE BIOPSY Right 07-24-11   Dr Bary Castilla  . MOHS SURGERY     bilateral shoulders  . PTCA    . skin cancer removal    . TOTAL ABDOMINAL HYSTERECTOMY W/ BILATERAL SALPINGOOPHORECTOMY       Current Outpatient Medications  Medication Sig Dispense Refill  . acetaminophen (TYLENOL) 650 MG CR tablet Take 650 mg by mouth every 8 (eight) hours as needed for pain.    [name] aspirin 81 MG tablet Take 81 mg by mouth every morning.     . Cholecalciferol (VITAMIN D-3) 5000 UNITS TABS Take 2,000 Int'l Units by mouth every morning.     . fexofenadine (ALLEGRA) 180 MG tablet Take  180 mg by mouth daily as needed for rhinitis.     [name] ibuprofen (ADVIL) 800 MG tablet Take 800 mg by mouth every 6 (six) hours as needed.    . latanoprost (XALATAN) 0.005 % ophthalmic solution Place 1 drop into both eyes at bedtime.    [name] LORazepam (ATIVAN) 0.5 MG tablet Take 0.5 mg by mouth every 8 (eight) hours as needed for anxiety.     [name] losartan (COZAAR) 25 MG tablet TAKE 1/2 TABLET(12.5 MG) BY MOUTH DAILY AFTER BREAKFAST 45 tablet 3  . pantoprazole (PROTONIX) 40 MG tablet TAKE 1 TABLET(40 MG) BY MOUTH TWICE DAILY 180 tablet 0  . rosuvastatin (CRESTOR) 10 MG tablet TAKE 1 TABLET(10 MG) BY MOUTH EVERY OTHER DAY 45 tablet 0  . timolol (TIMOPTIC) 0.5 % ophthalmic solution Place 1 drop into both eyes 2 (two) times daily.    . valACYclovir (VALTREX) 1000 MG tablet Take 1,000 mg by mouth 3 (three) times daily.    [name]  carvedilol (COREG) 6.25 MG tablet Take 1 tablet (6.25 mg total) by mouth 2 (two) times daily with a meal. 180 tablet 1   No current facility-administered medications for this visit.    Allergies:   Ace inhibitors, Benadryl [diphenhydramine hcl], Codeine, Diphenhydramine, Guaiacol, Hydrocodone, Cardizem [diltiazem], and Guaifenesin & derivatives    Social History:  The patient  reports that she quit smoking about 72 years ago. Her smoking use included cigarettes. She has never used smokeless tobacco. She reports that she does not drink alcohol and does not use drugs.   Family History:  The patient's family history includes Heart attack in her father; Heart disease in her brother; Leukemia in her mother.    ROS:  Please see the history of present illness.   Otherwise, review of systems are positive for none.   All other systems are reviewed and negative.    PHYSICAL EXAM: VS:  BP 122/62   Pulse 71   Ht 5' (1.524 m)   Wt 105 lb (47.6 kg)   BMI 20.51 kg/m  , BMI Body mass index is 20.51 kg/m. GEN: Well nourished, well developed, in no acute distress  HEENT: normal  Neck: no JVD,  carotid bruits, or masses Cardiac: RRR; no murmurs, rubs, or gallops,no edema  Respiratory:  clear to auscultation bilaterally, normal work of breathing GI: soft, nontender, nondistended, + BS MS: no deformity or atrophy  Skin: warm and dry, no rash Neuro:  Strength and sensation are intact Psych: euthymic mood, full affect   EKG:  EKG is ordered today. The ekg ordered today demonstrates normal sinus rhythm with old anterior infarct.  Low voltage  Recent Labs: 01/19/2020: TSH 1.471 01/24/2020: Magnesium 1.8 09/24/2020: ALT 12; BUN 26; Creatinine, Ser 1.13; Hemoglobin 11.7; Platelets 139; Potassium 4.1; Sodium 140    Lipid Panel    Component Value Date/Time   CHOL 172 09/02/2015 0824   TRIG 111 09/02/2015 0824   HDL 51 09/02/2015 0824   CHOLHDL 3.4 09/02/2015 0824   LDLCALC 99 09/02/2015 0824      Wt Readings from Last 3 Encounters:  11/11/20 105 lb (47.6 kg)  10/08/20 106 lb (48.1 kg)  09/24/20 104 lb 6.4 oz (47.4 kg)        ASSESSMENT AND PLAN:  1.  Coronary artery disease involving native coronary arteries with other forms of angina: Recent decline in functional capacity is likely due to shingles.  She seems to be stable from a cardiac standpoint.  Continue same medications.  2. Hyperlipidemia: Continue treatment with rosuvastatin.  LDL has been below 70.  3.  Chronic systolic heart failure due to ischemic cardiomyopathy: Furosemide was recently discontinued due to volume depletion.  She appears to be euvolemic even without diuretic.  Monitor symptoms for now and we can resume in the near future if needed.  Continue carvedilol and losartan.  4.  Paroxysmal atrial fibrillation: Brief episodes of A. fib in the setting of acute illness.  Recent ZIO monitor showed no evidence of atrial fibrillation.  No need for anticoagulation.  The dose of carvedilol was recently increased.   Disposition:   FU with me in 3 months  Signed,  Kathlyn Sacramento, MD  11/11/2020 5:03 PM     [place]

## 2020-11-11 NOTE — Patient Instructions (Signed)
Medication Instructions:  Your physician recommends that you continue on your current medications as directed. Please refer to the Current Medication list given to you today.  We have refilled Carvedilol 6.25 mg tablets. One tablet twice a day.  *If you need a refill on your cardiac medications before your next appointment, please call your pharmacy*   Lab Work: None ordered If you have labs (blood work) drawn today and your tests are completely normal, you will receive your results only by: [name] MyChart Message (if you have MyChart) OR . A paper copy in the mail If you have any lab test that is abnormal or we need to change your treatment, we will call you to review the results.   Testing/Procedures: None ordered   Follow-Up: At [facility], you and your health needs are our priority.  As part of our continuing mission to provide you with exceptional heart care, we have created designated Provider Care Teams.  These Care Teams include your primary Cardiologist (physician) and Advanced Practice Providers (APPs -  Physician Assistants and Nurse Practitioners) who all work together to provide you with the care you need, when you need it.  We recommend signing up for the patient portal called "MyChart".  Sign up information is provided on this After Visit Summary.  MyChart is used to connect with patients for Virtual Visits (Telemedicine).  Patients are able to view lab/test results, encounter notes, upcoming appointments, etc.  Non-urgent messages can be sent to your provider as well.   To learn more about what you can do with MyChart, go to [email or web address].    Your next appointment:   3 month(s)  The format for your next appointment:   In Person  Provider:   You may see Kathlyn Sacramento, MD or one of the following Advanced Practice Providers on your designated Care Team:    Murray Hodgkins, NP  Christell Faith, PA-C  Marrianne Mood, PA-C  Cadence [place], [place]  Laurann Montana, NP    Other Instructions N/A

## 2020-11-19 ENCOUNTER — Telehealth: Payer: Self-pay | Admitting: Cardiovascular Disease

## 2020-11-19 MED ORDER — LOSARTAN POTASSIUM 25 MG PO TABS: ORAL_TABLET | ORAL | 3 refills | Status: DC

## 2020-11-19 NOTE — Telephone Encounter (Signed)
Received fax from [facility] in [place] on [address] requesting refills for Losartan 25 mg. Rx request sent to pharmacy.

## 2020-11-19 NOTE — Telephone Encounter (Signed)
Rx request sent to pharmacy.  

## 2020-11-22 NOTE — Telephone Encounter (Signed)
The patient is already established with gastroenterology.  This should be addressed by gastroenterology and primary care physician.  I am not entirely certain that we even prescribed this to start with given that it is twice daily and I usually do not prescribe twice daily Protonix.

## 2020-11-22 NOTE — Telephone Encounter (Signed)
Spoke with patient and reviewed that her primary care provider or GI doctor would need to refill this medication. She verbalized understanding and will reach out to them. She had no further questions at this time.

## 2020-11-22 NOTE — Telephone Encounter (Signed)
Chart reviewed. Pantoprazole RX was sent to the patient's pharmacy on 11/19/20 for a 90-day supply.  Will forward to Dr. Fletcher Anon to advise if the patient needs to continue this/ should this be managed by her PCP?   I have looked online and the patient can get this using [place] [facility] for ~ $18.16 for a 90-day supply.

## 2020-11-22 NOTE — Telephone Encounter (Signed)
Patient calling  States that pantoprazole medication costs $98  Would like to know if there are other options  Please call to discuss

## 2020-12-01 ENCOUNTER — Other Ambulatory Visit: Payer: Self-pay

## 2020-12-01 ENCOUNTER — Encounter: Payer: Medicare Other | Attending: Internal Medicine | Admitting: Internal Medicine

## 2020-12-01 DIAGNOSIS — I2510 Atherosclerotic heart disease of native coronary artery without angina pectoris: Secondary | ICD-10-CM | POA: Diagnosis not present

## 2020-12-01 DIAGNOSIS — B0229 Other postherpetic nervous system involvement: Secondary | ICD-10-CM | POA: Insufficient documentation

## 2020-12-01 DIAGNOSIS — I5020 Unspecified systolic (congestive) heart failure: Secondary | ICD-10-CM | POA: Insufficient documentation

## 2020-12-01 DIAGNOSIS — Z8572 Personal history of non-Hodgkin lymphomas: Secondary | ICD-10-CM | POA: Insufficient documentation

## 2020-12-01 DIAGNOSIS — I480 Paroxysmal atrial fibrillation: Secondary | ICD-10-CM | POA: Diagnosis not present

## 2020-12-01 DIAGNOSIS — I110 Hypertensive heart disease with heart failure: Secondary | ICD-10-CM | POA: Insufficient documentation

## 2020-12-01 NOTE — Progress Notes (Addendum)
Jeanne Pittman, Jeanne Pittman ([id number]) Visit Report for 12/01/2020 Allergy List Details Patient Name: Jeanne Pittman, Jeanne Pittman. Date of Service: 12/01/2020 1:00 PM Medical Record Number: [id number] Patient Account Number: [id number] Date of Birth/Sex: February 04, 1935 (86 y.o. Female) Treating RN: Donnamarie Poag Primary Care Slyvester Latona: Fulton Reek Other Clinician: Jeanine Luz Referring Lenzy Kerschner: Fulton Reek Treating Ayah Cozzolino/Extender: Ricard Dillon Weeks in Treatment: 0 Allergies Active Allergies ACE Inhibitors codeine Reaction: hallucination Severity: Severe hydrocodone Cardizem diphenhydramine guaiacol guaifenesin Allergy Notes Electronic Signature(s) Signed: 12/02/2020 7:52:35 AM By: Donnamarie Poag Previous Signature: 12/01/2020 8:44:51 AM Version By: Donnamarie Poag Entered By: Donnamarie Poag on 12/01/2020 13:17:11 Jeanne Pittman ([id number]) -------------------------------------------------------------------------------- Arrival Information Details Patient Name: Jeanne Pittman Date of Service: 12/01/2020 1:00 PM Medical Record Number: [id number] Patient Account Number: [id number] Date of Birth/Sex: [birth year] (86 y.o. Female) Treating RN: Donnamarie Poag Primary Care Unita Detamore: Fulton Reek Other Clinician: Jeanine Luz Referring Jyaire Koudelka: Fulton Reek Treating Peityn Payton/Extender: Tito Dine in Treatment: 0 Visit Information Patient Arrived: Ambulatory Arrival Time: 13:03 Accompanied By: son Transfer Assistance: None Patient Identification Verified: Yes Secondary Verification Process Completed: Yes Patient Has Alerts: Yes Patient Alerts: Patient on Blood Thinner Aspirin NOT diabetic Electronic Signature(s) Signed: 12/02/2020 7:52:35 AM By: Donnamarie Poag Entered By: Donnamarie Poag on 12/01/2020 [place], Haja G. ([id number]) -------------------------------------------------------------------------------- Clinic Level of Care Assessment Details Patient Name:  Jeanne Pittman Date of Service: 12/01/2020 1:00 PM Medical Record Number: [id number] Patient Account Number: [id number] Date of Birth/Sex: [birth year] (86 y.o. Female) Treating RN: Dolan Amen Primary Care Emry Tobin: Fulton Reek Other Clinician: Jeanine Luz Referring Rhiana Morash: Fulton Reek Treating Wendi Lastra/Extender: Tito Dine in Treatment: 0 Clinic Level of Care Assessment Items TOOL 2 Quantity Score X - Use when only an EandM is performed on the INITIAL visit 1 0 ASSESSMENTS - Nursing Assessment / Reassessment X - General Physical Exam (combine w/ comprehensive assessment (listed just below) when performed on new 1 20 pt. evals) X- 1 25 Comprehensive Assessment (HX, ROS, Risk Assessments, Wounds Hx, etc.) ASSESSMENTS - Wound and Skin Assessment / Reassessment X - Simple Wound Assessment / Reassessment - one wound 1 5 '[]'  - 0 Complex Wound Assessment / Reassessment - multiple wounds '[]'  - 0 Dermatologic / Skin Assessment (not related to wound area) ASSESSMENTS - Ostomy and/or Continence Assessment and Care '[]'  - Incontinence Assessment and Management 0 '[]'  - 0 Ostomy Care Assessment and Management (repouching, etc.) PROCESS - Coordination of Care X - Simple Patient / Family Education for ongoing care 1 15 '[]'  - 0 Complex (extensive) Patient / Family Education for ongoing care '[]'  - 0 Staff obtains Programmer, systems, Records, Test Results / Process Orders '[]'  - 0 Staff telephones HHA, Nursing Homes / Clarify orders / etc '[]'  - 0 Routine Transfer to another Facility (non-emergent condition) '[]'  - 0 Routine Hospital Admission (non-emergent condition) '[]'  - 0 New Admissions / Biomedical engineer / Ordering NPWT, Apligraf, etc. '[]'  - 0 Emergency Hospital Admission (emergent condition) X- 1 10 Simple Discharge Coordination '[]'  - 0 Complex (extensive) Discharge Coordination PROCESS - Special Needs '[]'  - Pediatric / Minor Patient Management 0 '[]'  - 0 Isolation  Patient Management '[]'  - 0 Hearing / Language / Visual special needs '[]'  - 0 Assessment of Community assistance (transportation, D/C planning, etc.) '[]'  - 0 Additional assistance / Altered mentation '[]'  - 0 Support Surface(s) Assessment (bed, cushion, seat, etc.) INTERVENTIONS - Wound Cleansing / Measurement X - Wound Imaging (photographs - any number of wounds) 1 5 '[]'  - 0 Wound Tracing (instead  of photographs) X- 1 5 Simple Wound Measurement - one wound '[]'  - 0 Complex Wound Measurement - multiple wounds REBEKA, KIMBLE ([id number]) X- 1 5 Simple Wound Cleansing - one wound '[]'  - 0 Complex Wound Cleansing - multiple wounds INTERVENTIONS - Wound Dressings '[]'  - Small Wound Dressing one or multiple wounds 0 X- 1 15 Medium Wound Dressing one or multiple wounds '[]'  - 0 Large Wound Dressing one or multiple wounds '[]'  - 0 Application of Medications - injection INTERVENTIONS - Miscellaneous '[]'  - External ear exam 0 '[]'  - 0 Specimen Collection (cultures, biopsies, blood, body fluids, etc.) '[]'  - 0 Specimen(s) / Culture(s) sent or taken to Lab for analysis '[]'  - 0 Patient Transfer (multiple staff / Harrel Lemon Lift / Similar devices) '[]'  - 0 Simple Staple / Suture removal (25 or less) '[]'  - 0 Complex Staple / Suture removal (26 or more) '[]'  - 0 Hypo / Hyperglycemic Management (close monitor of Blood Glucose) '[]'  - 0 Ankle / Brachial Index (ABI) - do not check if billed separately Has the patient been seen at the hospital within the last three years: Yes Total Score: 105 Level Of Care: New/Established - Level 3 Electronic Signature(s) Signed: 12/01/2020 4:47:42 PM By: Georges Mouse, Minus Breeding RN Entered By: Georges Mouse, Kenia on 12/01/2020 13:48:42 Jeanne Pittman ([id number]) -------------------------------------------------------------------------------- Encounter Discharge Information Details Patient Name: Jeanne Pittman Date of Service: 12/01/2020 1:00 PM Medical Record Number:  [id number] Patient Account Number: [id number] Date of Birth/Sex: Dec 18, 1934 (86 y.o. Female) Treating RN: Carlene Coria Primary Care Onesti Bonfiglio: Fulton Reek Other Clinician: Jeanine Luz Referring Katsumi Wisler: Fulton Reek Treating Lomax Poehler/Extender: Tito Dine in Treatment: 0 Encounter Discharge Information Items Post Procedure Vitals Discharge Condition: Stable Temperature (F): 97.7 Ambulatory Status: Ambulatory Pulse (bpm): 77 Discharge Destination: Home Respiratory Rate (breaths/min): 16 Transportation: Private Auto Blood Pressure (mmHg): 124/50 Accompanied By: son Schedule Follow-up Appointment: Yes Clinical Summary of Care: Electronic Signature(s) Signed: 12/02/2020 10:06:31 AM By: Carlene Coria RN Entered By: Carlene Coria on 12/01/2020 13:59:20 Jeanne Pittman ([id number]) -------------------------------------------------------------------------------- Lower Extremity Assessment Details Patient Name: Jeanne Pittman Date of Service: 12/01/2020 1:00 PM Medical Record Number: [id number] Patient Account Number: [id number] Date of Birth/Sex: 1935-02-09 (86 y.o. Female) Treating RN: Donnamarie Poag Primary Care Khayri Kargbo: Fulton Reek Other Clinician: Jeanine Luz Referring Santrice Muzio: Fulton Reek Treating Siriah Treat/Extender: Ricard Dillon Weeks in Treatment: 0 Electronic Signature(s) Signed: 12/02/2020 7:52:35 AM By: Donnamarie Poag Entered By: Donnamarie Poag on 12/01/2020 13:16:38 Jeanne Pittman ([id number]) -------------------------------------------------------------------------------- Multi Wound Chart Details Patient Name: Jeanne Pittman Date of Service: 12/01/2020 1:00 PM Medical Record Number: [id number] Patient Account Number: [id number] Date of Birth/Sex: [birth year] (86 y.o. Female) Treating RN: Dolan Amen Primary Care Romanda Turrubiates: Fulton Reek Other Clinician: Jeanine Luz Referring Janaye Corp: Fulton Reek Treating Diana Davenport/Extender:  Tito Dine in Treatment: 0 Vital Signs Height(in): 61.5 Pulse(bpm): 5 Weight(lbs): 103 Blood Pressure(mmHg): 124/50 Body Mass Index(BMI): 19 Temperature(F): 97.7 Respiratory Rate(breaths/min): 16 Photos: [N/A:N/A] Wound Location: Midline Back N/A N/A Wounding Event: Other Lesion N/A N/A Primary Etiology: Atypical N/A N/A Comorbid History: Glaucoma, Arrhythmia, Congestive N/A N/A Heart Failure, Coronary Artery Disease, Hypertension, Osteoarthritis, Received Chemotherapy Date Acquired: 10/05/2020 N/A N/A Weeks of Treatment: 0 N/A N/A Wound Status: Open N/A N/A Clustered Wound: Yes N/A N/A Measurements L x W x D (cm) 10x18x0.1 N/A N/A Area (cm) : 141.372 N/A N/A Volume (cm) : 14.137 N/A N/A % Reduction in Area: 0.00% N/A N/A % Reduction in Volume: 0.00% N/A N/A Classification: Full Thickness Without  Exposed N/A N/A Support Structures Exudate Amount: Medium N/A N/A Exudate Type: Serous N/A N/A Exudate Color: amber N/A N/A Wound Margin: Flat and Intact N/A N/A Granulation Amount: Large (67-100%) N/A N/A Granulation Quality: Pink N/A N/A Necrotic Amount: None Present (0%) N/A N/A Exposed Structures: Fascia: No N/A N/A Fat Layer (Subcutaneous Tissue): No Tendon: No Muscle: No Joint: No Bone: No Limited to Skin Breakdown Epithelialization: Large (67-100%) N/A N/A Debridement: Chemical/Enzymatic/Mechanical N/A N/A Pre-procedure Verification/Time 13:45 N/A N/A Out Taken: Pain Control: Lidocaine 4% Topical Solution N/A N/A Instrument: Other(saline gauze) N/A N/A Bleeding: None N/A N/A Debridement Treatment Procedure was tolerated well N/A N/A ResponseRosetta Pittman ([id number]) Post Debridement 10x18x0.1 N/A N/A Measurements L x W x D (cm) Post Debridement Volume: 14.137 N/A N/A (cm) Procedures Performed: Debridement N/A N/A Treatment Notes Electronic Signature(s) Signed: 12/01/2020 5:03:12 PM By: Linton Ham MD Entered By: Linton Ham  on 12/01/2020 13:57:20 Jeanne Pittman ([id number]) -------------------------------------------------------------------------------- Multi-Disciplinary Care Plan Details Patient Name: Jeanne Pittman Date of Service: 12/01/2020 1:00 PM Medical Record Number: [id number] Patient Account Number: [id number] Date of Birth/Sex: 04/03/1935 (86 y.o. Female) Treating RN: Dolan Amen Primary Care Attallah Ontko: Fulton Reek Other Clinician: Jeanine Luz Referring Shere Eisenhart: Fulton Reek Treating Shimshon Narula/Extender: Tito Dine in Treatment: 0 Active Inactive Orientation to the Wound Care Program Nursing Diagnoses: Knowledge deficit related to the wound healing center program Goals: Patient/caregiver will verbalize understanding of the [facility] Program Date Initiated: 12/01/2020 Target Resolution Date: 12/01/2020 Goal Status: Active Interventions: Provide education on orientation to the wound center Notes: Wound/Skin Impairment Nursing Diagnoses: Impaired tissue integrity Goals: Patient/caregiver will verbalize understanding of skin care regimen Date Initiated: 12/01/2020 Target Resolution Date: 12/01/2020 Goal Status: Active Ulcer/skin breakdown will have a volume reduction of 30% by week 4 Date Initiated: 12/01/2020 Target Resolution Date: 12/31/2020 Goal Status: Active Ulcer/skin breakdown will have a volume reduction of 50% by week 8 Date Initiated: 12/01/2020 Target Resolution Date: 01/31/2021 Goal Status: Active Ulcer/skin breakdown will have a volume reduction of 80% by week 12 Date Initiated: 12/01/2020 Target Resolution Date: 03/02/2021 Goal Status: Active Ulcer/skin breakdown will heal within 14 weeks Date Initiated: 12/01/2020 Target Resolution Date: 04/02/2021 Goal Status: Active Interventions: Assess patient/caregiver ability to obtain necessary supplies Assess patient/caregiver ability to perform ulcer/skin care regimen upon admission and as  needed Assess ulceration(s) every visit Provide education on ulcer and skin care Treatment Activities: Referred to DME Francoise Chojnowski for dressing supplies : 12/01/2020 Skin care regimen initiated : 12/01/2020 Notes: Electronic Signature(s) Signed: 12/01/2020 4:47:42 PM By: Georges Mouse, Minus Breeding RN Entered By: Georges Mouse, Minus Breeding on 12/01/2020 13:42:17 Jeanne Pittman ([id number] Pittman ([id number]) -------------------------------------------------------------------------------- Pain Assessment Details Patient Name: Jeanne Pittman Date of Service: 12/01/2020 1:00 PM Medical Record Number: [id number] Patient Account Number: [id number] Date of Birth/Sex: Jan 13, 1935 (86 y.o. Female) Treating RN: Donnamarie Poag Primary Care Athalee Esterline: Fulton Reek Other Clinician: Jeanine Luz Referring Denya Buckingham: Fulton Reek Treating Shelisa Fern/Extender: Tito Dine in Treatment: 0 Active Problems Location of Pain Severity and Description of Pain Patient Has Paino No Site Locations Duration of the Pain. Constant / Intermittento Constant Rate the pain. Current Pain Level: 6 Pain Management and Medication Current Pain Management: Electronic Signature(s) Signed: 12/02/2020 7:52:35 AM By: Donnamarie Poag Entered By: Donnamarie Poag on 12/01/2020 13:04:37 Jeanne Pittman ([id number]) -------------------------------------------------------------------------------- Patient/Caregiver Education Details Patient Name: Jeanne Pittman Date of Service: 12/01/2020 1:00 PM Medical Record Number: [id number] Patient Account Number: [id number] Date of Birth/Gender: Jan 09, 1935 (86 y.o. Female)  Treating RN: Dolan Amen Primary Care Physician: Fulton Reek Other Clinician: Jeanine Luz Referring Physician: Fulton Reek Treating Physician/Extender: Tito Dine in Treatment: 0 Education Assessment Education Provided To: Patient Education Topics Provided Wound/Skin  Impairment: Methods: Explain/Verbal Responses: State content correctly Electronic Signature(s) Signed: 12/01/2020 4:47:42 PM By: Georges Mouse, Minus Breeding RN Entered By: Georges Mouse, Kenia on 12/01/2020 13:49:48 Jeanne Pittman ([id number]) -------------------------------------------------------------------------------- Wound Assessment Details Patient Name: Jeanne Pittman Date of Service: 12/01/2020 1:00 PM Medical Record Number: [id number] Patient Account Number: [id number] Date of Birth/Sex: 1934/11/30 (86 y.o. Female) Treating RN: Donnamarie Poag Primary Care Arnol Mcgibbon: Fulton Reek Other Clinician: Jeanine Luz Referring Kylin Dubs: Fulton Reek Treating Sharronda Schweers/Extender: Tito Dine in Treatment: 0 Wound Status Wound Number: 1 Primary Atypical Etiology: Wound Location: Midline Back Wound Open Wounding Event: Other Lesion Status: Date Acquired: 10/05/2020 Comorbid Glaucoma, Arrhythmia, Congestive Heart Failure, Weeks Of Treatment: 0 History: Coronary Artery Disease, Hypertension, Osteoarthritis, Clustered Wound: Yes Received Chemotherapy Photos Wound Measurements Length: (cm) 10 Width: (cm) 18 Depth: (cm) 0.1 Area: (cm) 141.372 Volume: (cm) 14.137 % Reduction in Area: 0% % Reduction in Volume: 0% Epithelialization: Large (67-100%) Tunneling: No Undermining: No Wound Description Classification: Full Thickness Without Exposed Support Structures Wound Margin: Flat and Intact Exudate Amount: Medium Exudate Type: Serous Exudate Color: amber Foul Odor After Cleansing: No Slough/Fibrino No Wound Bed Granulation Amount: Large (67-100%) Exposed Structure Granulation Quality: Pink Fascia Exposed: No Necrotic Amount: None Present (0%) Fat Layer (Subcutaneous Tissue) Exposed: No Tendon Exposed: No Muscle Exposed: No Joint Exposed: No Bone Exposed: No Limited to Skin Breakdown Treatment Notes Wound #1 (Back) Wound Laterality:  Midline Cleanser [place] Ancillary Kit - 15 Day Supply Discharge Instruction: Use supplies as instructed; Kit contains: (15) Saline Bullets; (15) 3x3 Gauze; 15 pr Gloves Jeanne Pittman ([id number]) Normal Saline Discharge Instruction: Wash your hands with soap and water. Remove old dressing, discard into plastic bag and place into trash. Cleanse the wound with Normal Saline prior to applying a clean dressing using gauze sponges, not tissues or cotton balls. Do not scrub or use excessive force. Pat dry using gauze sponges, not tissue or cotton balls. Peri-Wound Care Topical Primary Dressing Xeroform 5x9-HBD (in/in) Discharge Instruction: Apply Xeroform 5x9-HBD (in/in) as directed Secondary Dressing ABD Pad 5x9 (in/in) Discharge Instruction: Cover with ABD pad Secured With 60M Medipore H Soft Cloth Surgical Tape, 2x2 (in/yd) Compression Wrap Compression Stockings Add-Ons Electronic Signature(s) Signed: 12/01/2020 1:53:31 PM By: Georges Mouse, Minus Breeding RN Signed: 12/02/2020 7:52:35 AM By: Donnamarie Poag Entered By: Georges Mouse, Minus Breeding on 12/01/2020 13:53:31 Jeanne Pittman ([id number]) -------------------------------------------------------------------------------- [place] Details Patient Name: Jeanne Pittman Date of Service: 12/01/2020 1:00 PM Medical Record Number: [id number] Patient Account Number: [id number] Date of Birth/Sex: November 16, 1934 (86 y.o. Female) Treating RN: Donnamarie Poag Primary Care Julieann Drummonds: Fulton Reek Other Clinician: Jeanine Luz Referring Dianne Bady: Fulton Reek Treating Tiffeny Minchew/Extender: Tito Dine in Treatment: 0 Vital Signs Time Taken: 15:05 Temperature (F): 97.7 Height (in): 61.5 Pulse (bpm): 71 Source: Stated Respiratory Rate (breaths/min): 16 Weight (lbs): 103 Blood Pressure (mmHg): 124/50 Source: Stated Reference Range: 80 - 120 mg / dl Body Mass Index (BMI): 19.1 Electronic Signature(s) Signed: 12/02/2020 7:52:35 AM By:  Donnamarie Poag Entered ByDonnamarie Poag on 12/01/2020 13:07:51

## 2020-12-02 NOTE — Progress Notes (Signed)
Jeanne Pittman, Jeanne Pittman ([id number]) Visit Report for 12/01/2020 Chief Complaint Document Details Patient Name: Jeanne Pittman, Jeanne Pittman. Date of Service: 12/01/2020 1:00 PM Medical Record Number: [id number] Patient Account Number: [id number] Date of Birth/Sex: 06/17/1935 (86 y.o. Female) Treating RN: Dolan Amen Primary Care Provider: Fulton Reek Other Clinician: Jeanine Luz Referring Provider: Fulton Reek Treating Provider/Extender: Tito Dine in Treatment: 0 Information Obtained from: Patient Chief Complaint 12/01/2020; patient arrives today with wounds on her back in complication from herpes zoster Electronic Signature(s) Signed: 12/01/2020 5:03:12 PM By: Linton Ham MD Entered By: Linton Ham on 12/01/2020 13:58:24 Jeanne Pittman ([id number]) -------------------------------------------------------------------------------- Debridement Details Patient Name: Jeanne Pittman Date of Service: 12/01/2020 1:00 PM Medical Record Number: [id number] Patient Account Number: [id number] Date of Birth/Sex: Sep 13, 1934 (86 y.o. Female) Treating RN: Dolan Amen Primary Care Provider: Fulton Reek Other Clinician: Jeanine Luz Referring Provider: Fulton Reek Treating Provider/Extender: Tito Dine in Treatment: 0 Debridement Performed for Wound #1 Midline Back Assessment: Performed By: Physician Ricard Dillon, MD Debridement Type: Chemical/Enzymatic/Mechanical Agent Used: saline gauze Level of Consciousness (Pre- Awake and Alert procedure): Pre-procedure Verification/Time Out Yes - 13:45 Taken: Start Time: 13:45 Pain Control: Lidocaine 4% Topical Solution Instrument: Other : saline gauze Bleeding: None Response to Treatment: Procedure was tolerated well Level of Consciousness (Post- Awake and Alert procedure): Post Debridement Measurements of Total Wound Length: (cm) 10 Width: (cm) 18 Depth: (cm) 0.1 Volume: (cm)  14.137 Character of Wound/Ulcer Post Debridement: Stable Post Procedure Diagnosis Same as Pre-procedure Electronic Signature(s) Signed: 12/01/2020 4:47:42 PM By: Charlett Nose RN Signed: 12/01/2020 5:03:12 PM By: Linton Ham MD Entered By: Georges Mouse, Minus Breeding on 12/01/2020 13:45:59 Jeanne Pittman ([id number]) -------------------------------------------------------------------------------- HPI Details Patient Name: Jeanne Pittman Date of Service: 12/01/2020 1:00 PM Medical Record Number: [id number] Patient Account Number: [id number] Date of Birth/Sex: 07/04/1935 (86 y.o. Female) Treating RN: Dolan Amen Primary Care Provider: Fulton Reek Other Clinician: Jeanine Luz Referring Provider: Fulton Reek Treating Provider/Extender: Tito Dine in Treatment: 0 History of Present Illness HPI Description: ADMISSION 12/01/2020; this is an 86 year old woman accompanied by her son. Sent over by primary care [facility]. Her problem started in mid February where she developed an acute rash on her right lower back ultimately blistering with a clinical diagnosis of herpes zoster. This wrapped around her abdomen to the front. She was seen within 72 hours and given a course of valacyclovir. Unfortunately she developed very significant burning pain with this which includes pain and itching. She has had to be careful not to scratch at this but I think she is unfortunately doing just that. She has developed 3 superficial areas on the original back area. Some of this is still surrounded by inflammation which no doubt is inhibited healing I think there is probably been some irritation from scratching as well. She has not been systemically unwell. She has a history of recurrent non- Hodgkin's lymphoma no doubt complicating her course here. Past medical history includes paroxysmal atrial fibrillation, "coronary artery disease, non-Hodgkin's lymphoma which her son tells me  has been recurrent, heart failure with reduced ejection fraction with an EF of 40 to 45%, shingles, dysphagia and Electronic Signature(s) Signed: 12/01/2020 5:03:12 PM By: Linton Ham MD Entered By: Linton Ham on 12/01/2020 14:02:53 Jeanne Pittman ([id number]) -------------------------------------------------------------------------------- Physical Exam Details Patient Name: Jeanne Pittman Date of Service: 12/01/2020 1:00 PM Medical Record Number: [id number] Patient Account Number: [id number] Date of Birth/Sex: Jul 20, 1935 (86 y.o. Female) Treating RN: Dolan Amen Primary Care  Provider: Fulton Reek Other Clinician: Jeanine Luz Referring Provider: Fulton Reek Treating Provider/Extender: Tito Dine in Treatment: 0 Constitutional Sitting or standing Blood Pressure is within target range for patient.. Pulse regular and within target range for patient.[name] Respirations regular, non- labored and within target range.. Temperature is normal and within the target range for the patient.[name] appears in no distress. Notes Wound exam; the patient has an erythematous rash on her posterior right back I think this probably extends over more than 1 dermatome lower thoracic I expect. At 1 point this went around to the lower part of her abdomen in the front although the lesions are clear here. She still however complains of pain she has 3 superficial areas the largest one close to the spinal processes itself. She has had another 1 more laterally and one in close proximity to a larger area. These have no obvious debris in the center Electronic Signature(s) Signed: 12/01/2020 5:03:12 PM By: Linton Ham MD Entered By: Linton Ham on 12/01/2020 Jeanne Pittman, Jeanne G. ([id number]) -------------------------------------------------------------------------------- Physician Orders Details Patient Name: Jeanne Pittman Date of Service: 12/01/2020 1:00 PM Medical Record  Number: [id number] Patient Account Number: [id number] Date of Birth/Sex: [birth year] (86 y.o. Female) Treating RN: Dolan Amen Primary Care Provider: Fulton Reek Other Clinician: Jeanine Luz Referring Provider: Fulton Reek Treating Provider/Extender: Tito Dine in Treatment: 0 Verbal / Phone Orders: No Diagnosis Coding Follow-up Appointments o Return Appointment in 2 weeks. Bathing/ Shower/ Hygiene o Clean wound with Normal Saline or wound cleanser. o May shower; gently cleanse wound with antibacterial soap, rinse and pat dry prior to dressing wounds Wound Treatment Wound #1 - Back Wound Laterality: Midline Cleanser: Byram Ancillary Kit - 15 Day Supply (DME) (Generic) 1 x Per Day/30 Days Discharge Instructions: Use supplies as instructed; Kit contains: (15) Saline Bullets; (15) 3x3 Gauze; 15 pr Gloves Cleanser: Normal Saline 1 x Per Day/30 Days Discharge Instructions: Wash your hands with soap and water. Remove old dressing, discard into plastic bag and place into trash. Cleanse the wound with Normal Saline prior to applying a clean dressing using gauze sponges, not tissues or cotton balls. Do not scrub or use excessive force. Pat dry using gauze sponges, not tissue or cotton balls. Primary Dressing: Xeroform 5x9-HBD (in/in) (DME) (Generic) 1 x Per Day/30 Days Discharge Instructions: Apply Xeroform 5x9-HBD (in/in) as directed Secondary Dressing: ABD Pad 5x9 (in/in) (DME) (Generic) 1 x Per Day/30 Days Discharge Instructions: Cover with ABD pad Secured With: 45M [place] Surgical Tape, 2x2 (in/yd) (DME) (Generic) 1 x Per Day/30 Days Electronic Signature(s) Signed: 12/01/2020 4:47:42 PM By: Georges Mouse, Minus Breeding RN Signed: 12/01/2020 5:03:12 PM By: Linton Ham MD Entered By: Georges Mouse, Minus Breeding on 12/01/2020 13:50:44 Jeanne Pittman ([id number]) -------------------------------------------------------------------------------- Problem List  Details Patient Name: Jeanne Pittman Date of Service: 12/01/2020 1:00 PM Medical Record Number: [id number] Patient Account Number: [id number] Date of Birth/Sex: 1935/02/01 (86 y.o. 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 Female) Treating RN: Dolan Amen Primary Care Provider: Fulton Reek Other Clinician: Jeanine Luz Referring Provider: Fulton Reek Treating Provider/Extender: Tito Dine in Treatment: 0 Subjective Chief Complaint Information obtained from Patient 12/01/2020; patient arrives today with wounds on her back in complication from herpes zoster History of Present Illness (HPI) ADMISSION 12/01/2020; this is an 86 year old woman accompanied by her son. Sent over by primary care [facility]. Her problem started in mid February where she developed an acute rash on her right lower back ultimately blistering with a clinical diagnosis of herpes zoster. This wrapped around her abdomen to the front. She was seen within 72 hours and given a course of  valacyclovir. Unfortunately she developed very significant burning pain with this which includes pain and itching. She has had to be careful not to scratch at this but I think she is unfortunately doing just that. She has developed 3 superficial areas on the original back area. Some of this is still surrounded by inflammation which no doubt is inhibited healing I think there is probably been some irritation from scratching as well. She has not been systemically unwell. She has a history of recurrent non- Hodgkin's lymphoma no doubt complicating her course here. Past medical history includes paroxysmal atrial fibrillation, "coronary artery disease, non-Hodgkin's lymphoma which her son tells me has been recurrent, heart failure with reduced ejection fraction with an EF of 40 to 45%, shingles, dysphagia and Patient History Information obtained from Patient. Allergies ACE Inhibitors, codeine (Severity: Severe, Reaction: hallucination), hydrocodone, Cardizem, diphenhydramine, guaiacol, guaifenesin Social History Never smoker, Marital Status - Divorced, Alcohol Use - Never, Drug Use - No History, Caffeine Use - Moderate. Medical History Eyes Patient has history of Glaucoma Cardiovascular Patient has history of Arrhythmia - afib, Congestive Heart Failure, Coronary Artery Disease, Hypertension Endocrine Denies history of Type I Diabetes, Type II Diabetes Musculoskeletal Patient has history of Osteoarthritis Oncologic Patient has history of Received Chemotherapy - non hodgins lymphoma/2006 Denies history of Received Radiation Medical And Surgical History Notes Cardiovascular cardiac stents hx Review of Systems (ROS) Constitutional Symptoms (General Health) Denies complaints or symptoms of Fatigue, Fever, Chills, Marked Weight Change. Eyes Complains or has symptoms of Glasses / Contacts. Ear/Nose/Mouth/Throat EGD hx planned baloon procedure for throat stricture  reported Hematologic/Lymphatic Denies complaints or symptoms of Bleeding / Clotting Disorders, Human Immunodeficiency Virus. Respiratory Denies complaints or symptoms of Chronic or frequent coughs, Shortness of Breath. Cardiovascular Complains or has symptoms of Chest pain - hx. Gastrointestinal GERD gastris hx Endocrine Denies complaints or symptoms of Hepatitis, Thyroid disease, Polydypsia (Excessive Thirst). Genitourinary Denies complaints or symptoms of Kidney failure/ Dialysis, Incontinence/dribbling. Immunological Jeanne Pittman, Jeanne Pittman ([id number]) Denies complaints or symptoms of Hives, Itching. Integumentary (Skin) Denies complaints or symptoms of Wounds, Bleeding or bruising tendency, Breakdown, Swelling. Neurologic Denies complaints or symptoms of Numbness/parasthesias, Focal/Weakness. Objective Constitutional Sitting or standing Blood Pressure is within target range for patient.. Pulse regular and within target range for patient.[name] Respirations regular, non- labored and within target range.. Temperature is normal and within the target range for the patient.[name] appears in no distress. Vitals Time Taken: 3:05 PM, Height: 61.5 in, Source: Stated, Weight: 103 lbs, Source: Stated, BMI: 19.1, Temperature: 97.7 F, Pulse: 71 bpm, Respiratory Rate: 16 breaths/min, Blood Pressure: 124/50 mmHg. General Notes: Wound exam; the patient has an erythematous rash on her posterior right back I think  this probably extends over more than 1 dermatome lower thoracic I expect. At 1 point this went around to the lower part of her abdomen in the front although the lesions are clear here. She still however complains of pain she has 3 superficial areas the largest one close to the spinal processes itself. She has had another 1 more laterally and one in close proximity to a larger area. These have no obvious debris in the center Integumentary (Hair, Skin) Wound #1 status is Open. Original cause of wound was  Other Lesion. The date acquired was: 10/05/2020. The wound is located on the Midline Back. The wound measures 10cm length x 18cm width x 0.1cm depth; 141.372cm^2 area and 14.137cm^3 volume. The wound is limited to skin breakdown. There is no tunneling or undermining noted. There is a medium amount of serous drainage noted. The wound margin is flat and intact. There is large (67-100%) pink granulation within the wound bed. There is no necrotic tissue within the wound bed. 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 There was a Chemical/Enzymatic/Mechanical debridement performed by Ricard Dillon, MD. With the following instrument(s): saline gauze after achieving pain control using Lidocaine 4% Topical Solution. Other agent used was saline gauze. A time out was conducted at 13:45, prior to the start of the procedure. There was no bleeding. The procedure was tolerated well. Post Debridement Measurements: 10cm length x 18cm width x 0.1cm depth; 14.137cm^3 volume. Character of Wound/Ulcer Post Debridement is stable. Post procedure Diagnosis Wound #1: Same as Pre-Procedure Plan Follow-up Appointments: Return Appointment in 2 weeks. Bathing/ Shower/ Hygiene: Clean wound with Normal Saline or wound cleanser. Jeanne Pittman, Jeanne Pittman ([id number]) May shower; gently cleanse wound with antibacterial soap, rinse and pat dry prior to dressing wounds WOUND #1: - Back Wound Laterality: Midline Cleanser: [place] Ancillary Kit - 15 Day Supply (DME) (Generic) 1 x Per Day/30 Days Discharge Instructions: Use supplies as instructed; Kit contains: (15) Saline Bullets; (15) 3x3 Gauze; 15 pr Gloves Cleanser: Normal Saline 1 x Per Day/30 Days Discharge Instructions: Wash your hands with soap and  water. Remove old dressing, discard into plastic bag and place into trash. Cleanse the wound with Normal Saline prior to applying a clean dressing using gauze sponges, not tissues or cotton balls. Do not scrub or use excessive force. Pat dry using gauze sponges, not tissue or cotton balls. Primary Dressing: Xeroform 5x9-HBD (in/in) (DME) (Generic) 1 x Per Day/30 Days Discharge Instructions: Apply Xeroform 5x9-HBD (in/in) as directed Secondary Dressing: ABD Pad 5x9 (in/in) (DME) (Generic) 1 x Per Day/30 Days Discharge Instructions: Cover with ABD pad Secured With: 49M Medipore H Soft Cloth Surgical Tape, 2x2 (in/yd) (DME) (Generic) 1 x Per Day/30 Days 1. Patient still with acute neuritis and inflammation related to herpes zoster for which she was treated. She started Cymbalta yesterday after failing it trial of gabapentin. Not clear to me that she is really doing anything healthy with this wound she is probably scratching or rubbing not clear she is keeping off the wounds she occasionally uses Bactroban but is not putting topical dressings on them 2. I elected to go with wound care principles here will keep this covered and moist we will use Xeroform and gauze she will change this daily with the help of her family. 3. She has postherpetic neuralgia  which is severe she started Cymbalta yesterday she has tried capsaicin and gabapentin with either side effects or an effect. 4. I see no evidence of complicating cellulitis here although there is a confluent erythema area around the larger wound this is not tender Electronic Signature(s) Signed: 12/01/2020 5:03:12 PM By: Linton Ham MD Entered By: Linton Ham on 12/01/2020 14:07:33 Jeanne Pittman ([id number]) -------------------------------------------------------------------------------- ROS/PFSH Details Patient Name: Jeanne Pittman Date of Service: 12/01/2020 1:00 PM Medical Record Number: [id number] Patient Account Number:  [id number] Date of Birth/Sex: 17-Mar-1935 (86 y.o. Female) Treating RN: Donnamarie Poag Primary Care Provider: Fulton Reek Other Clinician: Jeanine Luz Referring Provider: Fulton Reek Treating Provider/Extender: Tito Dine in Treatment: 0 Information Obtained From Patient Constitutional Symptoms (General Health) Complaints and Symptoms: Negative for: Fatigue; Fever; Chills; Marked Weight Change Eyes Complaints and Symptoms: Positive for: Glasses / Contacts Medical History: Positive for: Glaucoma Hematologic/Lymphatic Complaints and Symptoms: Negative for: Bleeding / Clotting Disorders; Human Immunodeficiency Virus Respiratory Complaints and Symptoms: Negative for: Chronic or frequent coughs; Shortness of Breath Cardiovascular Complaints and Symptoms: Positive for: Chest pain - hx Medical History: Positive for: Arrhythmia - afib; Congestive Heart Failure; Coronary Artery Disease; Hypertension Past Medical History Notes: cardiac stents hx Endocrine Complaints and Symptoms: Negative for: Hepatitis; Thyroid disease; Polydypsia (Excessive Thirst) Medical History: Negative for: Type I Diabetes; Type II Diabetes Genitourinary Complaints and Symptoms: Negative for: Kidney failure/ Dialysis; Incontinence/dribbling Immunological Complaints and Symptoms: Negative for: Hives; Itching Integumentary (Skin) Complaints and Symptoms: Negative for: Wounds; Bleeding or bruising tendency; Breakdown; Swelling Jeanne Pittman ([id number]) Neurologic Complaints and Symptoms: Negative for: Numbness/parasthesias; Focal/Weakness Ear/Nose/Mouth/Throat Complaints and Symptoms: Review of System Notes: EGD hx planned baloon procedure for throat stricture reported Gastrointestinal Complaints and Symptoms: Review of System Notes: GERD gastris hx Musculoskeletal Medical History: Positive for: Osteoarthritis Oncologic Medical History: Positive for: Received  Chemotherapy - non hodgins lymphoma/2006 Negative for: Received Radiation Psychiatric HBO Extended History Items Eyes: Glaucoma Immunizations Pneumococcal Vaccine: Received Pneumococcal Vaccination: No Immunization Notes: did NOT have shingles vaccines current flu vaccine Implantable Devices None Family and Social History Never smoker; Marital Status - Divorced; Alcohol Use: Never; Drug Use: No History; Caffeine Use: Moderate; Financial Concerns: No; Food, Clothing or Shelter Needs: No; Support System Lacking: No; Transportation Concerns: No Electronic Signature(s) Signed: 12/01/2020 5:03:12 PM By: Linton Ham MD Signed: 12/02/2020 7:52:35 AM By: Donnamarie Poag Entered By: Donnamarie Poag on 12/01/2020 13:24:32 Jeanne Pittman ([id number]) -------------------------------------------------------------------------------- [place] Details Patient Name: Jeanne Pittman Date of Service: 12/01/2020 Medical Record Number: [id number] Patient Account Number: [id number] Date of Birth/Sex: 1935/02/11 (86 y.o. 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 B02.8 Zoster with other complications M78.6 Neuralgia and neuritis, unspecified Facility Procedures CPT4 Code: [id number] Description: 00712 - WOUND CARE VISIT-LEV 3 EST PT Modifier: Quantity: 1 Physician Procedures CPT4 Code: 1975883 Description: 25498 - WC PHYS LEVEL 2 - NEW PT Modifier: Quantity: 1 CPT4 Code: Description: ICD-10 Diagnosis Description 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

## 2020-12-02 NOTE — Progress Notes (Signed)
YATZARY, MERRIWEATHER ([id number]) Visit Report for 12/01/2020 Abuse/Suicide Risk Screen Details Patient Name: Jeanne Pittman, Jeanne Pittman Date of Service: 12/01/2020 1:00 PM Medical Record Number: [id number] Patient Account Number: [id number] Date of Birth/Sex: 12-31-1934 (86 y.o. Female) Treating RN: Donnamarie Poag Primary Care Areyana Leoni: Fulton Reek Other Clinician: Jeanine Luz Referring Lisaanne Lawrie: Fulton Reek Treating Clary Meeker/Extender: Tito Dine in Treatment: 0 Abuse/Suicide Risk Screen Items Answer ABUSE RISK SCREEN: Has anyone close to you tried to hurt or harm you recentlyo No Do you feel uncomfortable with anyone in your familyo No Has anyone forced you do things that you didnot want to doo No Electronic Signature(s) Signed: 12/02/2020 7:52:35 AM By: Donnamarie Poag Entered By: Donnamarie Poag on 12/01/2020 13:24:43 Jeanne Pittman ([id number]) -------------------------------------------------------------------------------- Activities of Daily Living Details Patient Name: Jeanne Pittman Date of Service: 12/01/2020 1:00 PM Medical Record Number: [id number] Patient Account Number: [id number] Date of Birth/Sex: 1935-01-26 (86 y.o. Female) Treating RN: Donnamarie Poag Primary Care Lajoya Dombek: Fulton Reek Other Clinician: Jeanine Luz Referring Leslee Haueter: Fulton Reek Treating Lenette Rau/Extender: Tito Dine in Treatment: 0 Activities of Daily Living Items Answer Activities of Daily Living (Please select one for each item) Drive Automobile Completely Able Take Medications Completely Able Use Telephone Completely Able Care for Appearance Completely Able Use Toilet Completely Able Bath / Shower Completely Able Dress Self Completely Able Feed Self Completely Able Walk Completely Able Get In / Out Bed Completely Able Housework Completely Able Prepare Meals Completely [place] Completely Able Shop for Self Completely Able Electronic  Signature(s) Signed: 12/02/2020 7:52:35 AM By: Donnamarie Poag Entered By: Donnamarie Poag on 12/01/2020 13:25:14 Jeanne Pittman ([id number]) -------------------------------------------------------------------------------- Education Screening Details Patient Name: Jeanne Pittman Date of Service: 12/01/2020 1:00 PM Medical Record Number: [id number] Patient Account Number: [id number] Date of Birth/Sex: 1934/10/11 (86 y.o. Female) Treating RN: Donnamarie Poag Primary Care Berkley Cronkright: Fulton Reek Other Clinician: Jeanine Luz Referring Phoenyx Paulsen: Fulton Reek Treating Euel Castile/Extender: Tito Dine in Treatment: 0 Primary Learner Assessed: Patient Learning Preferences/Education Level/Primary Language Learning Preference: Explanation Highest Education Level: College or Above Preferred Language: English Cognitive Barrier Language Barrier: No Translator Needed: No Memory Deficit: No Emotional Barrier: No Cultural/Religious Beliefs Affecting Medical Care: No Physical Barrier Impaired Vision: No Impaired Hearing: No Decreased Hand dexterity: No Knowledge/Comprehension Knowledge Level: High Comprehension Level: High Ability to understand written instructions: High Ability to understand verbal instructions: High Motivation Anxiety Level: Calm Cooperation: Cooperative Education Importance: Acknowledges Need Interest in Health Problems: Asks Questions Perception: Coherent Willingness to Engage in Self-Management High Activities: Readiness to Engage in Self-Management High Activities: Electronic Signature(s) Signed: 12/02/2020 7:52:35 AM By: Donnamarie Poag Entered By: Donnamarie Poag on 12/01/2020 13:25:47 Jeanne Pittman ([id number]) -------------------------------------------------------------------------------- Fall Risk Assessment Details Patient Name: Jeanne Pittman Date of Service: 12/01/2020 1:00 PM Medical Record Number: [id number] Patient Account Number:  [id number] Date of Birth/Sex: 04-Apr-1935 (86 y.o. Female) Treating RN: Donnamarie Poag Primary Care Shakhia Gramajo: Fulton Reek Other Clinician: Jeanine Luz Referring Immaculate Crutcher: Fulton Reek Treating Anajulia Leyendecker/Extender: Tito Dine in Treatment: 0 Fall Risk Assessment Items Have you had 2 or more falls in the last 12 monthso 0 No Have you had any fall that resulted in injury in the last 12 monthso 0 No FALLS RISK SCREEN History of falling - immediate or within 3 months 0 No Secondary diagnosis (Do you have 2 or more medical diagnoseso) 0 No Ambulatory aid None/bed rest/wheelchair/nurse 0 Yes Crutches/cane/walker 0 No Furniture 0 No Intravenous therapy Access/Saline/Heparin Lock 0 No Gait/Transferring Normal/  bed rest/ wheelchair 0 Yes Weak (short steps with or without shuffle, stooped but able to lift head while walking, may 0 No seek support from furniture) Impaired (short steps with shuffle, may have difficulty arising from chair, head down, impaired 0 No balance) Mental Status Oriented to own ability 0 Yes Electronic Signature(s) Signed: 12/02/2020 7:52:35 AM By: Donnamarie Poag Entered By: Donnamarie Poag on 12/01/2020 13:26:17 Jeanne Pittman ([id number]) -------------------------------------------------------------------------------- Foot Assessment Details Patient Name: Jeanne Pittman Date of Service: 12/01/2020 1:00 PM Medical Record Number: [id number] Patient Account Number: [id number] Date of Birth/Sex: 08/28/1934 (86 y.o. Female) Treating RN: Donnamarie Poag Primary Care Cattleya Dobratz: Fulton Reek Other Clinician: Jeanine Luz Referring Nakaila Freeze: Fulton Reek Treating Gabbriella Presswood/Extender: Tito Dine in Treatment: 0 Foot Assessment Items Site Locations + = Sensation present, - = Sensation absent, C = Callus, U = Ulcer R = Redness, W = Warmth, M = Maceration, PU = Pre-ulcerative lesion F = Fissure, S = Swelling, D = Dryness Assessment Right:  Left: Other Deformity: No No Prior Foot Ulcer: No No Prior Amputation: No No Charcot Joint: No No Ambulatory Status: Ambulatory Without Help Gait: Steady Electronic Signature(s) Signed: 12/02/2020 7:52:35 AM By: Donnamarie Poag Entered By: Donnamarie Poag on 12/01/2020 13:27:13 Jeanne Pittman ([id number]) -------------------------------------------------------------------------------- Nutrition Risk Screening Details Patient Name: Jeanne Pittman Date of Service: 12/01/2020 1:00 PM Medical Record Number: [id number] Patient Account Number: [id number] Date of Birth/Sex: 1934/11/20 (86 y.o. Female) Treating RN: Donnamarie Poag Primary Care Dilon Lank: Fulton Reek Other Clinician: Jeanine Luz Referring Konstantinos Cordoba: Fulton Reek Treating Kathyleen Radice/Extender: Tito Dine in Treatment: 0 Height (in): 61.5 Weight (lbs): 103 Body Mass Index (BMI): 19.1 Nutrition Risk Screening Items Score Screening NUTRITION RISK SCREEN: I have an illness or condition that made me change the kind and/or amount of food I eat 2 Yes I eat fewer than two meals per day 3 Yes I eat few fruits and vegetables, or milk products 0 No I have three or more drinks of beer, liquor or wine almost every day 0 No I have tooth or mouth problems that make it hard for me to eat 0 No I don't always have enough money to buy the food I need 0 No I eat alone most of the time 1 Yes I take three or more different prescribed or over-the-counter drugs a day 0 No Without wanting to, I have lost or gained 10 pounds in the last six months 0 No I am not always physically able to shop, cook and/or feed myself 0 No Nutrition Protocols Good Risk Protocol Moderate Risk Protocol 0 Provide education on nutrition High Risk Proctocol Risk Level: High Risk Score: 6 Notes says throat stricture affects eating-procedure scheduled next week Electronic Signature(s) Signed: 12/02/2020 7:52:35 AM By: Donnamarie Poag Entered ByDonnamarie Poag on  12/01/2020 13:27:04

## 2020-12-07 ENCOUNTER — Encounter
Admission: RE | Disposition: A | Discharge status: Home / Self Care | Payer: Self-pay | Source: Home / Self Care | Attending: Gastroenterology

## 2020-12-07 ENCOUNTER — Encounter: Payer: Self-pay | Admitting: *Deleted

## 2020-12-07 ENCOUNTER — Ambulatory Visit: Payer: Medicare Other | Admitting: Anesthesiology

## 2020-12-07 ENCOUNTER — Ambulatory Visit
Admission: RE | Admit: 2020-12-07 | Discharge: 2020-12-07 | Disposition: A | Discharge status: Home / Self Care | Payer: Medicare Other | Attending: Gastroenterology | Admitting: Gastroenterology

## 2020-12-07 DIAGNOSIS — I5042 Chronic combined systolic (congestive) and diastolic (congestive) heart failure: Secondary | ICD-10-CM | POA: Insufficient documentation

## 2020-12-07 DIAGNOSIS — K317 Polyp of stomach and duodenum: Secondary | ICD-10-CM | POA: Diagnosis not present

## 2020-12-07 DIAGNOSIS — Z8572 Personal history of non-Hodgkin lymphomas: Secondary | ICD-10-CM | POA: Diagnosis not present

## 2020-12-07 DIAGNOSIS — Z888 Allergy status to other drugs, medicaments and biological substances status: Secondary | ICD-10-CM | POA: Diagnosis not present

## 2020-12-07 DIAGNOSIS — R1310 Dysphagia, unspecified: Secondary | ICD-10-CM | POA: Diagnosis present

## 2020-12-07 DIAGNOSIS — Z885 Allergy status to narcotic agent status: Secondary | ICD-10-CM | POA: Insufficient documentation

## 2020-12-07 DIAGNOSIS — Z955 Presence of coronary angioplasty implant and graft: Secondary | ICD-10-CM | POA: Diagnosis not present

## 2020-12-07 DIAGNOSIS — Z79899 Other long term (current) drug therapy: Secondary | ICD-10-CM | POA: Diagnosis not present

## 2020-12-07 DIAGNOSIS — Z87891 Personal history of nicotine dependence: Secondary | ICD-10-CM | POA: Diagnosis not present

## 2020-12-07 DIAGNOSIS — I110 Hypertensive heart disease with heart failure: Secondary | ICD-10-CM | POA: Insufficient documentation

## 2020-12-07 DIAGNOSIS — Z7982 Long term (current) use of aspirin: Secondary | ICD-10-CM | POA: Diagnosis not present

## 2020-12-07 HISTORY — PX: ESOPHAGOGASTRODUODENOSCOPY (EGD) WITH PROPOFOL: SHX5813

## 2020-12-07 SURGERY — ESOPHAGOGASTRODUODENOSCOPY (EGD) WITH PROPOFOL
Anesthesia: General

## 2020-12-07 MED ORDER — PROPOFOL 500 MG/50ML IV EMUL
INTRAVENOUS | Status: DC | PRN
  Administered 2020-12-07: 120 ug/kg/min via INTRAVENOUS

## 2020-12-07 MED ORDER — SODIUM CHLORIDE 0.9 % IV SOLN: INTRAVENOUS | Status: DC

## 2020-12-07 MED ORDER — PROPOFOL 500 MG/50ML IV EMUL
INTRAVENOUS | Status: AC
  Filled 2020-12-07: qty 50

## 2020-12-07 NOTE — Anesthesia Preprocedure Evaluation (Addendum)
Anesthesia Evaluation  Patient identified by MRN, date of birth, ID band Patient awake    Reviewed: Allergy & Precautions, H&P , NPO status , Patient's Chart, lab work & pertinent test results  History of Anesthesia Complications Negative for: history of anesthetic complications  Airway Mallampati: II  TM Distance: >3 FB     Dental  (+) Teeth Intact   Pulmonary neg pulmonary ROS, neg sleep apnea, neg COPD, former smoker,    breath sounds clear to auscultation       Cardiovascular hypertension, (-) angina+ CAD, + Past MI and +CHF  + dysrhythmias Atrial Fibrillation + Valvular Problems/Murmurs (mod TR)  Rhythm:regular Rate:Normal  Echo March 2022: 1. Left ventricular ejection fraction, by estimation, is 35 to 40%. The  left ventricle has moderately decreased function. The left ventricle  demonstrates regional wall motion abnormalities (Large region of severe  hypokinesis of the anterior,  anteroseptal and apical region). Left ventricular diastolic parameters are  consistent with Grade I diastolic dysfunction (impaired relaxation).    Neuro/Psych  Headaches, Anxiety negative psych ROS   GI/Hepatic Neg liver ROS, GERD  Controlled,  Endo/Other  Hypothyroidism   Renal/GU negative Renal ROS  negative genitourinary   Musculoskeletal   Abdominal   Peds  Hematology negative hematology ROS (+)   Anesthesia Other Findings Past Medical History: No date: A-fib [facility])     Comment:  07/2012: A. fib with RVR in the setting of myocardial               infarction. Converted to sinus rhythm with amiodarone. No              recurrence. No date: Anxiety No date: Atrophic vaginitis No date: Chronic combined systolic (congestive) and diastolic  (congestive) heart failure (HCC)     Comment:  a. EF was 25-35% post MI but improved to 45-50% in               04/2013; b. 03/2017 Echo: EF 40-45%, mod focal/basal               hypertrophy  of septum. Sev mid-apicalanteroseptal, ant,               and apical HK. Gr1 DD, mild AI/MR, mod TR, PASP 40mHg. No date: Colon adenomas No date: Coronary artery disease     Comment:  a. 06/9416 STEMI complicated by cardiogenic shock.               Cath/PCI: LAD 141m (DESx2), RCA 95p(DES). EF 25%; b.               03/2017 MV: EF 57%, fixed apical, periapical, mid to               distal anteroseptal defect. No ischemia. No date: Eczema No date: Fibrocystic breast disease No date: Gastritis No date: GERD (gastroesophageal reflux disease) No date: Glaucoma No date: Headache No date: Hyperlipidemia No date: Hypertension No date: Hypothyroidism No date: Insomnia No date: Ischemic cardiomyopathy     Comment:  a. 07/2012 EF 25-35% following MI-->Improved to 45-50%;               b. 03/2017 Echo: EF 40-45%. 08/18/2014: Myocardial infarction [facility]) 2005: Non Hodgkin's lymphoma ([place])     Comment:  reoccurance 2007 and 2012 No date: Osteoarthritis No date: Osteoporosis No date: Polyneuropathy No date: Polyposis of colon No date: Restless leg syndrome No date: Shingles     Comment:  right No date: Urinary, incontinence,  stress female  Past Surgical History: 1956: ABDOMINAL HYSTERECTOMY No date: APPENDECTOMY 03/18/2015: AXILLARY LYMPH NODE BIOPSY; Left     Comment:  Procedure: AXILLARY LYMPH NODE BIOPSY;  Surgeon: [name], MD;  Location: [name];  Service: General;                Laterality: Left; 08/19/2012: CARDIAC CATHETERIZATION     Comment:  [place]; [place] No date: COLONOSCOPY 03/02/2016: COLONOSCOPY WITH PROPOFOL; N/A     Comment:  Procedure: COLONOSCOPY WITH PROPOFOL;  Surgeon: Manya Silvas, MD;  Location: [facility] ENDOSCOPY;  Service:               Endoscopy;  Laterality: N/A; 09/26/2017: COLONOSCOPY WITH PROPOFOL; N/A     Comment:  Procedure: COLONOSCOPY WITH PROPOFOL;  Surgeon: Manya Silvas, MD;  Location: [facility] ENDOSCOPY;   Service:               Endoscopy;  Laterality: N/A; 03/11/2020: COLONOSCOPY WITH PROPOFOL; N/A     Comment:  Procedure: COLONOSCOPY WITH PROPOFOL;  Surgeon: Benjamine Sprague, DO;  Location: [name] ENDOSCOPY;  Service: General;               Laterality: N/A; No date: CORONARY ANGIOPLASTY WITH STENT PLACEMENT     Comment:  x3 stents No date: CORONARY ANGIOPLASTY WITH STENT PLACEMENT No date: CORONARY ARTERY BYPASS GRAFT 03/02/2016: ESOPHAGOGASTRODUODENOSCOPY (EGD) WITH PROPOFOL; N/A     Comment:  Procedure: ESOPHAGOGASTRODUODENOSCOPY (EGD) WITH               PROPOFOL;  Surgeon: Manya Silvas, MD;  Location: [facility]              ENDOSCOPY;  Service: Endoscopy;  Laterality: N/A; 04/14/2019: ESOPHAGOGASTRODUODENOSCOPY (EGD) WITH PROPOFOL; N/A     Comment:  Procedure: ESOPHAGOGASTRODUODENOSCOPY (EGD) WITH               PROPOFOL;  Surgeon: Tol[place], Benay Pike, MD;  Location:               [name] ENDOSCOPY;  Service: Gastroenterology;  Laterality:               N/A; 07-24-11: LYMPH NODE BIOPSY; Right     Comment:  Dr Bary Castilla No date: MOHS SURGERY     Comment:  bilateral shoulders No date: PTCA No date: skin cancer removal No date: TOTAL ABDOMINAL HYSTERECTOMY W/ BILATERAL SALPINGOOPHORECTOMY     Reproductive/Obstetrics negative OB ROS                            Anesthesia Physical Anesthesia Plan  ASA: III  Anesthesia Plan: General   Post-op Pain Management:    Induction:   PONV Risk Score and Plan: Propofol infusion and TIVA  Airway Management Planned: Nasal Cannula  Additional Equipment:   Intra-op Plan:   Post-operative Plan:   Informed Consent: I have reviewed the patients History and Physical, chart, labs and discussed the procedure including the risks, benefits and alternatives for the proposed anesthesia with the patient or authorized representative who has indicated his/her understanding and acceptance.     Dental Advisory Given  Plan  Discussed with:  Anesthesiologist, CRNA and Surgeon  Anesthesia Plan Comments:         Anesthesia Quick Evaluation

## 2020-12-07 NOTE — Interval H&P Note (Signed)
History and Physical Interval Note:  12/07/2020 12:24 PM  Jeanne Pittman  has presented today for surgery, with the diagnosis of DYSPHAGIA.  The various methods of treatment have been discussed with the patient and family. After consideration of risks, benefits and other options for treatment, the patient has consented to  Procedure(s): ESOPHAGOGASTRODUODENOSCOPY (EGD) WITH PROPOFOL (N/A) as a surgical intervention.  The patient's history has been reviewed, patient examined, no change in status, stable for surgery.  I have reviewed the patient's chart and labs.  Questions were answered to the patient's satisfaction.     Lesly Rubenstein  Ok to proceed with EGD

## 2020-12-07 NOTE — Anesthesia Procedure Notes (Signed)
Performed by: Cook-Martin, Daielle Melcher Pre-anesthesia Checklist: Patient identified, Emergency Drugs available, Suction available, Patient being monitored and Timeout performed Patient Re-evaluated:Patient Re-evaluated prior to induction Oxygen Delivery Method: Nasal cannula Preoxygenation: Pre-oxygenation with 100% oxygen Induction Type: IV induction Airway Equipment and Method: Bite block Placement Confirmation: positive ETCO2 and CO2 detector       

## 2020-12-07 NOTE — H&P (Signed)
Outpatient short stay form Pre-procedure 12/07/2020 12:22 PM Jeanne Miyamoto MD, MPH  Primary Physician: Dr. Doy Hutching  Reason for visit:  Dysphagia  History of present illness:   86 y/o lady with history of hypertension here for EGD for dysphagia to solids. Had barium swallow with 13 mm tablet that had difficulty passing. No blood thinners. No neck surgeries. No family history of GI malignancies.    Current Facility-Administered Medications:  .  0.9 %  sodium chloride infusion, , Intravenous, Continuous, Chaston Bradburn, Hilton Cork, MD, Last Rate: 20 mL/hr at 12/07/20 1221, Continued from Pre-op at 12/07/20 1221  Medications Prior to Admission  Medication Sig Dispense Refill Last Dose  . aspirin 81 MG tablet Take 81 mg by mouth every morning.    12/06/2020 at Unknown time  . carvedilol (COREG) 6.25 MG tablet Take 1 tablet (6.25 mg total) by mouth 2 (two) times daily with a meal. 180 tablet 1 12/07/2020 at 0630  . latanoprost (XALATAN) 0.005 % ophthalmic solution Place 1 drop into both eyes at bedtime.   12/06/2020 at Unknown time  . LORazepam (ATIVAN) 0.5 MG tablet Take 0.5 mg by mouth every 8 (eight) hours as needed for anxiety.    12/06/2020 at Unknown time  . losartan (COZAAR) 25 MG tablet TAKE 1/2 TABLET(12.5 MG) BY MOUTH DAILY AFTER BREAKFAST 45 tablet 3 12/06/2020 at Unknown time  . pantoprazole (PROTONIX) 40 MG tablet TAKE 1 TABLET(40 MG) BY MOUTH TWICE DAILY 180 tablet 1 12/06/2020 at Unknown time  . rosuvastatin (CRESTOR) 10 MG tablet TAKE 1 TABLET(10 MG) BY MOUTH EVERY OTHER DAY 45 tablet 0 12/06/2020 at Unknown time  . timolol (TIMOPTIC) 0.5 % ophthalmic solution Place 1 drop into both eyes 2 (two) times daily.   12/06/2020 at Unknown time  . acetaminophen (TYLENOL) 650 MG CR tablet Take 650 mg by mouth every 8 (eight) hours as needed for pain.     . Cholecalciferol (VITAMIN D-3) 5000 UNITS TABS Take 2,000 Int'l Units by mouth every morning.      . fexofenadine (ALLEGRA) 180 MG tablet Take 180 mg  by mouth daily as needed for rhinitis.      [name] ibuprofen (ADVIL) 800 MG tablet Take 800 mg by mouth every 6 (six) hours as needed.     . valACYclovir (VALTREX) 1000 MG tablet Take 1,000 mg by mouth 3 (three) times daily.        Allergies  Allergen Reactions  . Ace Inhibitors     Other reaction(s): Cough  . Benadryl [Diphenhydramine Hcl]     Other reaction(s): Unknown  . Codeine     Other reaction(s): Hallucination  . Diphenhydramine   . Guaiacol   . Hydrocodone     Other reaction(s): Hallucination  . Cardizem [Diltiazem] Rash  . Guaifenesin & Derivatives Rash     Past Medical History:  Diagnosis Date  . A-fib ([place])    07/2012: A. fib with RVR in the setting of myocardial infarction. Converted to sinus rhythm with amiodarone. No recurrence.  . Anxiety   . Atrophic vaginitis   . Chronic combined systolic (congestive) and diastolic (congestive) heart failure ([place])    a. EF was 25-35% post MI but improved to 45-50% in 04/2013; b. 03/2017 Echo: EF 40-45%, mod focal/basal hypertrophy of septum. Sev mid-apicalanteroseptal, ant, and apical HK. Gr1 DD, mild AI/MR, mod TR, PASP 31mHg.  . Colon adenomas   . Coronary artery disease    a. 70/2637 STEMI complicated by cardiogenic shock. Cath/PCI: LAD 116m (DESx2), RCA  95p(DES). EF 25%; b. 03/2017 MV: EF 57%, fixed apical, periapical, mid to distal anteroseptal defect. No ischemia.  . Eczema   . Fibrocystic breast disease   . Gastritis   . GERD (gastroesophageal reflux disease)   . Glaucoma   . Headache   . Hyperlipidemia   . Hypertension   . Hypothyroidism   . Insomnia   . Ischemic cardiomyopathy    a. 07/2012 EF 25-35% following MI-->Improved to 45-50%;  b. 03/2017 Echo: EF 40-45%.  . Myocardial infarction ([place]) 08/18/2014  . Non Hodgkin's lymphoma ([place]) 2005   reoccurance 2007 and 2012  . Osteoarthritis   . Osteoporosis   . Polyneuropathy   . Polyposis of colon   . Restless leg syndrome   . Shingles    right  . Urinary,  incontinence, stress female     Review of systems:  Otherwise negative.    Physical Exam  Gen: Alert, oriented. Appears stated age.  HEENT: PERRLA. Lungs: No respiratory distress CV: RRR Abd: soft, benign, no masses Ext: No edema    Planned procedures: Proceed with EGD. The patient understands the nature of the planned procedure, indications, risks, alternatives and potential complications including but not limited to bleeding, infection, perforation, damage to internal organs and possible oversedation/side effects from anesthesia. The patient agrees and gives consent to proceed.  Please refer to procedure notes for findings, recommendations and patient disposition/instructions.     Jeanne Miyamoto MD, MPH Gastroenterology 12/07/2020  12:22 PM

## 2020-12-07 NOTE — Transfer of Care (Signed)
Immediate Anesthesia Transfer of Care Note  Patient: Jeanne Pittman  Procedure(s) Performed: ESOPHAGOGASTRODUODENOSCOPY (EGD) WITH PROPOFOL (N/A )  Patient Location: PACU  Anesthesia Type:General  Level of Consciousness: awake and sedated  Airway & Oxygen Therapy: Patient Spontanous Breathing and Patient connected to nasal cannula oxygen  Post-op Assessment: Report given to RN and Post -op Vital signs reviewed and stable  Post vital signs: Reviewed and stable  Last Vitals:  Vitals Value Taken Time  BP    Temp    Pulse    Resp    SpO2      Last Pain:  Vitals:   12/07/20 1157  TempSrc: Temporal  PainSc: 0-No pain         Complications: No complications documented.

## 2020-12-07 NOTE — Op Note (Signed)
[facility] Patient Name: Jeanne Pittman Procedure Date: 12/07/2020 12:32 PM MRN: [id number] Account #: [id number] Date of Birth: October 18, 1934 Admit Type: Outpatient Age: 86 Room: [facility] ENDO ROOM 1 Gender: Female Note Status: Finalized Procedure:             Upper GI endoscopy Indications:           Dysphagia Providers:             Andrey Farmer MD, MD Medicines:             Monitored Anesthesia Care Complications:         No immediate complications. Estimated blood loss:                         Minimal. Procedure:             Pre-Anesthesia Assessment:                        - Prior to the procedure, a History and Physical was                         performed, and patient medications and allergies were                         reviewed. The patient is competent. The risks and                         benefits of the procedure and the sedation options and                         risks were discussed with the patient. All questions                         were answered and informed consent was obtained.                         Patient identification and proposed procedure were                         verified by the physician, the nurse, the anesthetist                         and the technician in the endoscopy suite. Mental                         Status Examination: alert and oriented. Airway                         Examination: normal oropharyngeal airway and neck                         mobility. Respiratory Examination: clear to                         auscultation. CV Examination: normal. Prophylactic                         Antibiotics: The patient does not require prophylactic  antibiotics. Prior Anticoagulants: The patient has                         taken no previous anticoagulant or antiplatelet                         agents. ASA Grade Assessment: II - A patient with mild                         systemic disease.  After reviewing the risks and                         benefits, the patient was deemed in satisfactory                         condition to undergo the procedure. The anesthesia                         plan was to use monitored anesthesia care (MAC).                         Immediately prior to administration of medications,                         the patient was re-assessed for adequacy to receive                         sedatives. The heart rate, respiratory rate, oxygen                         saturations, blood pressure, adequacy of pulmonary                         ventilation, and response to care were monitored                         throughout the procedure. The physical status of the                         patient was re-assessed after the procedure.                        After obtaining informed consent, the endoscope was                         passed under direct vision. Throughout the procedure,                         the patient's blood pressure, pulse, and oxygen                         saturations were monitored continuously. The Endoscope                         was introduced through the mouth, and advanced to the                         second part of duodenum. The upper GI endoscopy was  accomplished without difficulty. The patient tolerated                         the procedure well. Findings:      No endoscopic abnormality was evident in the esophagus to explain the       patient's complaint of dysphagia. It was decided, however, to proceed       with dilation of the lower third of the esophagus. A TTS dilator was       passed through the scope. Dilation with a 15-16.5-18 mm balloon dilator       was performed to 18 mm. The dilation site was examined and showed no       change. Estimated blood loss: none. Biopsies were obtained from the       proximal and distal esophagus with cold forceps for histology of       suspected eosinophilic  esophagitis. Estimated blood loss was minimal.      Multiple sessile polyps with no stigmata of recent bleeding were found       in the gastric body. These appeared to be benign fundic gland polyps      The examined duodenum was normal. Impression:            - No endoscopic esophageal abnormality to explain                         patient's dysphagia. Esophagus dilated. Dilated.                         Biopsied.                        - Multiple gastric polyps.                        - Normal examined duodenum. Recommendation:        - Discharge patient to home.                        - Resume previous diet.                        - Return to referring physician as previously                         scheduled. If no improvement in swallowing, would                         recommend manometry.                        - Await pathology results. Procedure Code(s):     --- Professional ---                        [phone], Esophagogastroduodenoscopy, flexible,                         transoral; with transendoscopic balloon dilation of                         esophagus (less than 30 mm diameter) Diagnosis Code(s):     --- Professional ---  R13.10, Dysphagia, unspecified                        K31.7, Polyp of stomach and duodenum CPT copyright 2019 [employer]. All rights reserved. The codes documented in this report are preliminary and upon coder review may  be revised to meet current compliance requirements. Andrey Farmer MD, MD 12/07/2020 12:51:15 PM Number of Addenda: 0 Note Initiated On: 12/07/2020 12:32 PM Estimated Blood Loss:  Estimated blood loss was minimal.      [facility]

## 2020-12-08 ENCOUNTER — Encounter: Payer: Self-pay | Admitting: Gastroenterology

## 2020-12-08 LAB — SURGICAL PATHOLOGY

## 2020-12-09 NOTE — Anesthesia Postprocedure Evaluation (Signed)
Anesthesia Post Note  Patient: Jeanne Pittman  Procedure(s) Performed: ESOPHAGOGASTRODUODENOSCOPY (EGD) WITH PROPOFOL (N/A )  Patient location during evaluation: PACU Anesthesia Type: General Level of consciousness: awake and alert Pain management: pain level controlled Vital Signs Assessment: post-procedure vital signs reviewed and stable Respiratory status: spontaneous breathing, nonlabored ventilation and respiratory function stable Cardiovascular status: blood pressure returned to baseline and stable Postop Assessment: no apparent nausea or vomiting Anesthetic complications: no   No complications documented.   Last Vitals:  Vitals:   12/07/20 1300 12/07/20 1310  BP: (!) 148/64 (!) 150/66  Pulse: 66 65  Resp: 18 16  Temp:    SpO2: 99% 100%    Last Pain:  Vitals:   12/08/20 0737  TempSrc:   PainSc: 0-No pain                 Tera Mater

## 2020-12-15 ENCOUNTER — Encounter: Payer: Medicare Other | Admitting: Internal Medicine

## 2020-12-15 ENCOUNTER — Other Ambulatory Visit: Payer: Self-pay

## 2020-12-15 DIAGNOSIS — B0229 Other postherpetic nervous system involvement: Secondary | ICD-10-CM | POA: Diagnosis not present

## 2020-12-15 NOTE — Progress Notes (Signed)
PHALLON, HAYDU ([id number]) Visit Report for 12/15/2020 HPI Details Patient Name: Jeanne Pittman, Jeanne Pittman. Date of Service: 12/15/2020 1:45 PM Medical Record Number: [id number] Patient Account Number: [id number] Date of Birth/Sex: 1934/10/05 (86 y.o. F) Treating RN: Cornell Barman Primary Care Provider: Fulton Reek Other Clinician: Jeanine Luz Referring Provider: Fulton Reek Treating Provider/Extender: Tito Dine in Treatment: 2 History of Present Illness HPI Description: ADMISSION 12/01/2020; this is an 86 year old woman accompanied by her son. Sent over by primary care [facility]. Her problem started in mid February where she developed an acute rash on her right lower back ultimately blistering with a clinical diagnosis of herpes zoster. This wrapped around her abdomen to the front. She was seen within 72 hours and given a course of valacyclovir. Unfortunately she developed very significant burning pain with this which includes pain and itching. She has had to be careful not to scratch at this but I think she is unfortunately doing just that. She has developed 3 superficial areas on the original back area. Some of this is still surrounded by inflammation which no doubt is inhibited healing I think there is probably been some irritation from scratching as well. She has not been systemically unwell. She has a history of recurrent non- Hodgkin's lymphoma no doubt complicating her course here. Past medical history includes paroxysmal atrial fibrillation, "coronary artery disease, non-Hodgkin's lymphoma which her son tells me has been recurrent, heart failure with reduced ejection fraction with an EF of 40 to 45%, shingles, dysphagia and 4/27; 2-week follow-up. This is a patient who had wounds in the setting of resolving herpes zoster. She has postherpetic neuralgia although all of this seems to be a lot better this week. She still has a slough superficial small area but most of the  superficial wound she had when she was here 2 weeks ago if he is healed. She used Xeroform on the areas which was helping however she is changed back to Neosporin Electronic Signature(s) Signed: 12/15/2020 5:04:28 PM By: Linton Ham MD Entered By: Linton Ham on 12/15/2020 14:23:41 Jeanne Pittman ([id number]) -------------------------------------------------------------------------------- Physical Exam Details Patient Name: Jeanne Pittman Date of Service: 12/15/2020 1:45 PM Medical Record Number: [id number] Patient Account Number: [id number] Date of Birth/Sex: [birth year] (86 y.o. F) Treating RN: Cornell Barman Primary Care Provider: Fulton Reek Other Clinician: Jeanine Luz Referring Provider: Fulton Reek Treating Provider/Extender: Tito Dine in Treatment: 2 Notes Wound exam; the rash on the patient's back is a lot better than 2 weeks ago. Specific to the wounds she had 3 open areas I believe they were all superficial these of the largely healed over there is still 1 remaining that is not completely closed but almost everything here is epithelialized. There is no evidence of cellulitis which can sometimes complicate even healing zoster wounds Electronic Signature(s) Signed: 12/15/2020 5:04:28 PM By: Linton Ham MD Entered By: Linton Ham on 12/15/2020 14:24:57 Jeanne Pittman ([id number]) -------------------------------------------------------------------------------- Physician Orders Details Patient Name: Jeanne Pittman Date of Service: 12/15/2020 1:45 PM Medical Record Number: [id number] Patient Account Number: [id number] Date of Birth/Sex: Jul 05, 1935 (86 y.o. F) Treating RN: Cornell Barman Primary Care Provider: Fulton Reek Other Clinician: Jeanine Luz Referring Provider: Fulton Reek Treating Provider/Extender: Tito Dine in Treatment: 2 Verbal / Phone Orders: No Diagnosis Coding Follow-up Appointments o Return  Appointment in 2 weeks. Bathing/ Shower/ Hygiene o Clean wound with Normal Saline or wound cleanser. o May shower; gently cleanse wound with antibacterial soap, rinse and pat dry prior  to dressing wounds Wound Treatment Wound #1 - Back Wound Laterality: Midline Cleanser: Byram Ancillary Kit - 15 Day Supply (Generic) 1 x Per Day/30 Days Discharge Instructions: Use supplies as instructed; Kit contains: (15) Saline Bullets; (15) 3x3 Gauze; 15 pr Gloves Cleanser: Normal Saline 1 x Per Day/30 Days Discharge Instructions: Wash your hands with soap and water. Remove old dressing, discard into plastic bag and place into trash. Cleanse the wound with Normal Saline prior to applying a clean dressing using gauze sponges, not tissues or cotton balls. Do not scrub or use excessive force. Pat dry using gauze sponges, not tissue or cotton balls. Primary Dressing: Xeroform 5x9-HBD (in/in) (Generic) 1 x Per Day/30 Days Discharge Instructions: Apply Xeroform 5x9-HBD (in/in) as directed Secondary Dressing: ABD Pad 5x9 (in/in) (Generic) 1 x Per Day/30 Days Discharge Instructions: Cover with ABD pad Secured With: 90M Medipore H Soft Cloth Surgical Tape, 2x2 (in/yd) (Generic) 1 x Per Day/30 Days Electronic Signature(s) Signed: 12/15/2020 4:08:37 PM By: Gretta Cool, BSN, RN, CWS, Kim RN, BSN Signed: 12/15/2020 5:04:28 PM By: Linton Ham MD Entered By: Gretta Cool, BSN, RN, CWS, Kim on 12/15/2020 14:07:20 Jeanne Pittman ([id number]) -------------------------------------------------------------------------------- Problem List Details Patient Name: Jeanne Pittman, Jeanne Pittman. Date of Service: 12/15/2020 1:45 PM Medical Record Number: [id number] Patient Account Number: [id number] Date of Birth/Sex: 14-Nov-1934 (86 y.o. 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 F) Treating RN: Cornell Barman Primary Care Provider: Fulton Reek Other Clinician: Jeanine Luz Referring Provider: Fulton Reek Treating Provider/Extender: Tito Dine in Treatment: 2 Subjective History of Present Illness (HPI) ADMISSION 12/01/2020; this is an 86 year old woman accompanied by her son. Sent over by primary care [facility]. Her problem started in mid February where she developed an acute rash on her right lower back ultimately blistering with a clinical diagnosis of herpes zoster. This wrapped around her abdomen to the front. She was seen within 72 hours and given a course of valacyclovir. Unfortunately she developed very significant burning pain with this which includes pain and itching. She has had to be careful not to scratch at this but I think she is unfortunately doing just that. She has developed 3 superficial areas on the original back area. Some of this is still surrounded by inflammation which no doubt is inhibited healing I think there is probably been some irritation from scratching as well. She has not been systemically unwell. She has a history of recurrent non- Hodgkin's lymphoma no doubt complicating her  course here. Past medical history includes paroxysmal atrial fibrillation, "coronary artery disease, non-Hodgkin's lymphoma which her son tells me has been recurrent, heart failure with reduced ejection fraction with an EF of 40 to 45%, shingles, dysphagia and 4/27; 2-week follow-up. This is a patient who had wounds in the setting of resolving herpes zoster. She has postherpetic neuralgia although all of this seems  to be a lot better this week. She still has a slough superficial small area but most of the superficial wound she had when she was here 2 weeks ago if he is healed. She used Xeroform on the areas which was helping however she is changed back to Neosporin Objective Constitutional Vitals Time Taken: 1:52 PM, Height: 61.5 in, Weight: 103 lbs, BMI: 19.1, Temperature: 97.6 F, Pulse: 64 bpm, Respiratory Rate: 16 breaths/min, Blood Pressure: 137/66 mmHg. Integumentary (Hair, Skin) Wound #1 status is Open. Original cause of wound was Other Lesion. The date acquired was: 10/05/2020. The wound has been in treatment 2 weeks. The wound is located on the Midline Back. The wound measures 3cm length x 2.3cm width x 0.1cm depth; 5.419cm^2 area and 0.542cm^3 volume. The wound is limited to skin breakdown. There is no tunneling or undermining noted. There is a medium amount of serous drainage noted. The wound margin is flat and intact. There is large (67-100%) pink granulation within the wound bed. There is no necrotic tissue within the wound bed. 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 Bathing/ Shower/ Hygiene: Clean wound with Normal Saline or wound cleanser. Jeanne Pittman, Jeanne Pittman ([id number]) May shower; gently cleanse wound with antibacterial soap, rinse and pat dry prior to dressing wounds WOUND #1: -  Back Wound Laterality: Midline Cleanser: Byram Ancillary Kit - 15 Day Supply (Generic) 1 x Per Day/30 Days Discharge Instructions: Use supplies as instructed; Kit contains: (15) Saline Bullets; (15) 3x3 Gauze; 15 pr Gloves Cleanser: Normal Saline 1 x Per Day/30 Days Discharge Instructions: Wash your hands with soap and water. Remove old dressing, discard into plastic bag and place into trash. Cleanse the wound with Normal Saline prior to applying a clean dressing using gauze sponges, not tissues or cotton balls. Do not scrub or use excessive force. Pat dry using gauze sponges, not tissue or cotton balls. Primary Dressing: Xeroform 5x9-HBD (in/in) (Generic) 1 x Per Day/30 Days Discharge Instructions: Apply Xeroform 5x9-HBD (in/in) as directed Secondary Dressing: ABD Pad 5x9 (in/in) (Generic) 1 x Per Day/30 Days Discharge Instructions: Cover with ABD pad Secured With: 79M Medipore H Soft Cloth Surgical Tape, 2x2 (in/yd) (Generic) 1 x Per Day/30 Days 1. Continue with the Xeroform 2. Follow-up in 2 weeks although this may be totally healed by this point if so they can call and cancel 3. Overall resolving herpes zoster which may have been complicated by bacterial cellulitis and/or repetitive scratching. 4. I advised stopping the Neosporin with some people have significant contact dermatitis to Electronic Signature(s) Signed: 12/15/2020 5:04:28 PM By: Linton Ham MD Entered By: Linton Ham on 12/15/2020 14:25:58 Jeanne Pittman ([id number]) -------------------------------------------------------------------------------- SuperBill Details Patient Name: Jeanne Pittman Date of Service: 12/15/2020 Medical Record Number: [id number] Patient Account Number: [id number] Date of Birth/Sex: 05/21/1935 (86 y.o. 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 B02.8 Zoster with other complications D78.2 Neuralgia and neuritis, unspecified Facility Procedures CPT4 Code: [id number] Description: [phone] - WOUND CARE VISIT-LEV 2 EST PT Modifier: Quantity: 1 Physician Procedures CPT4 Code: [id number] Description: 95093 - WC PHYS LEVEL 3 - EST PT Modifier: Quantity: 1 CPT4 Code: Description: ICD-10 Diagnosis Description L98.421 Non-pressure  chronic ulcer of back limited to breakdown of skin B02.8 Zoster with other complications Modifier: Quantity: Electronic Signature(s) Signed: 12/15/2020 5:04:28 PM By: Linton Ham MD Entered By: Linton Ham on 12/15/2020 14:26:23

## 2020-12-15 NOTE — Progress Notes (Addendum)
Jeanne Pittman ([id number]) Visit Report for 12/15/2020 Arrival Information Details Patient Name: Jeanne Pittman, Jeanne Pittman. Date of Service: 12/15/2020 1:45 PM Medical Record Number: [id number] Patient Account Number: [id number] Date of Birth/Sex: [birth year] (86 y.o. F) Treating RN: Carlene Coria Primary Care Kele Withem: Fulton Reek Other Clinician: Jeanine Luz Referring Kadeidra Coryell: Fulton Reek Treating Jamaris Theard/Extender: Tito Dine in Treatment: 2 Visit Information History Since Last Visit All ordered tests and consults were completed: No Patient Arrived: Ambulatory Added or deleted any medications: No Arrival Time: 13:50 Any new allergies or adverse reactions: No Accompanied By: son Had a fall or experienced change in No Transfer Assistance: None activities of daily living that may affect Patient Identification Verified: Yes risk of falls: Secondary Verification Process Completed: Yes Signs or symptoms of abuse/neglect since last visito No Patient Requires Transmission-Based No Hospitalized since last visit: No Precautions: Implantable device outside of the clinic excluding No Patient Has Alerts: Yes cellular tissue based products placed in the center Patient Alerts: Patient on Blood since last visit: Thinner Has Dressing in Place as Prescribed: Yes Aspirin Pain Present Now: No NOT diabetic Electronic Signature(s) Signed: 12/15/2020 3:51:10 PM By: Carlene Coria RN Entered By: Carlene Coria on 12/15/2020 13:51:30 Jeanne Pittman ([id number]) -------------------------------------------------------------------------------- Clinic Level of Care Assessment Details Patient Name: Jeanne Pittman Date of Service: 12/15/2020 1:45 PM Medical Record Number: [id number] Patient Account Number: [id number] Date of Birth/Sex: 24-Mar-1935 (86 y.o. F) Treating RN: Cornell Barman Primary Care Daeshaun Specht: Fulton Reek Other Clinician: Jeanine Luz Referring Nayleah Gamel: Fulton Reek Treating Kaimana Lurz/Extender: Tito Dine in Treatment: 2 Clinic Level of Care Assessment Items TOOL 4 Quantity Score [] - Use when only an EandM is performed on FOLLOW-UP visit 0 ASSESSMENTS - Nursing Assessment / Reassessment X - Reassessment of Co-morbidities (includes updates in patient status) 1 10 X- 1 5 Reassessment of Adherence to Treatment Plan ASSESSMENTS - Wound and Skin Assessment / Reassessment X - Simple Wound Assessment / Reassessment - one wound 1 5 [] - 0 Complex Wound Assessment / Reassessment - multiple wounds [] - 0 Dermatologic / Skin Assessment (not related to wound area) ASSESSMENTS - Focused Assessment [] - Circumferential Edema Measurements - multi extremities 0 [] - 0 Nutritional Assessment / Counseling / Intervention [] - 0 Lower Extremity Assessment (monofilament, tuning fork, pulses) [] - 0 Peripheral Arterial Disease Assessment (using hand held doppler) ASSESSMENTS - Ostomy and/or Continence Assessment and Care [] - Incontinence Assessment and Management 0 [] - 0 Ostomy Care Assessment and Management (repouching, etc.) PROCESS - Coordination of Care X - Simple Patient / Family Education for ongoing care 1 15 [] - 0 Complex (extensive) Patient / Family Education for ongoing care X- 1 10 Staff obtains Programmer, systems, Records, Test Results / Process Orders [] - 0 Staff telephones HHA, Nursing Homes / Clarify orders / etc [] - 0 Routine Transfer to another Facility (non-emergent condition) [] - 0 Routine Hospital Admission (non-emergent condition) [] - 0 New Admissions / Biomedical engineer / Ordering NPWT, Apligraf, etc. [] - 0 Emergency Hospital Admission (emergent condition) [] - 0 Simple Discharge Coordination [] - 0 Complex (extensive) Discharge Coordination PROCESS - Special Needs [] - Pediatric / Minor Patient Management 0 [] - 0 Isolation Patient Management [] - 0 Hearing / Language / Visual special needs [] -  0 Assessment of Community assistance (transportation, D/C planning, etc.) [] - 0 Additional assistance / Altered mentation [] - 0 Support Surface(s) Assessment (bed, cushion, seat, etc.) INTERVENTIONS - Wound  Cleansing / Measurement Jeanne Pittman ([id number]) X- 1 5 Simple Wound Cleansing - one wound [] - 0 Complex Wound Cleansing - multiple wounds X- 1 5 Wound Imaging (photographs - any number of wounds) [] - 0 Wound Tracing (instead of photographs) X- 1 5 Simple Wound Measurement - one wound [] - 0 Complex Wound Measurement - multiple wounds INTERVENTIONS - Wound Dressings X - Small Wound Dressing one or multiple wounds 1 10 [] - 0 Medium Wound Dressing one or multiple wounds [] - 0 Large Wound Dressing one or multiple wounds [] - 0 Application of Medications - topical [] - 0 Application of Medications - injection INTERVENTIONS - Miscellaneous [] - External ear exam 0 [] - 0 Specimen Collection (cultures, biopsies, blood, body fluids, etc.) [] - 0 Specimen(s) / Culture(s) sent or taken to Lab for analysis [] - 0 Patient Transfer (multiple staff / Civil Service fast streamer / Similar devices) [] - 0 Simple Staple / Suture removal (25 or less) [] - 0 Complex Staple / Suture removal (26 or more) [] - 0 Hypo / Hyperglycemic Management (close monitor of Blood Glucose) [] - 0 Ankle / Brachial Index (ABI) - do not check if billed separately X- 1 5 Vital Signs Has the patient been seen at the hospital within the last three years: Yes Total Score: 75 Level Of Care: New/Established - Level 2 Electronic Signature(s) Signed: 12/15/2020 4:08:37 PM By: Gretta Cool, BSN, RN, CWS, Kim RN, BSN Entered By: Gretta Cool, BSN, RN, CWS, Kim on 12/15/2020 14:07:58 Jeanne Pittman ([id number]) -------------------------------------------------------------------------------- Encounter Discharge Information Details Patient Name: Jeanne Pittman Date of Service: 12/15/2020 1:45 PM Medical Record Number:  [id number] Patient Account Number: [id number] Date of Birth/Sex: 26-Dec-1934 (86 y.o. F) Treating RN: Cornell Barman Primary Care Provider: Fulton Reek Other Clinician: Jeanine Luz Referring Provider: Fulton Reek Treating Provider/Extender: Tito Dine in Treatment: 2 Encounter Discharge Information Items Discharge Condition: Stable Ambulatory Status: Ambulatory Discharge Destination: Home Transportation: Private Auto Accompanied By: self Schedule Follow-up Appointment: Yes Clinical Summary of Care: Electronic Signature(s) Signed: 12/15/2020 4:08:37 PM By: Gretta Cool, BSN, RN, CWS, Kim RN, BSN Entered By: Gretta Cool, BSN, RN, CWS, Kim on 12/15/2020 14:09:15 Jeanne Pittman ([id number]) -------------------------------------------------------------------------------- Lower Extremity Assessment Details Patient Name: Jeanne Pittman Date of Service: 12/15/2020 1:45 PM Medical Record Number: [id number] Patient Account Number: [id number] Date of Birth/Sex: 1935/03/15 (86 y.o. F) Treating RN: Carlene Coria Primary Care Provider: Fulton Reek Other Clinician: Jeanine Luz Referring Provider: Fulton Reek Treating Provider/Extender: Tito Dine in Treatment: 2 Electronic Signature(s) Signed: 12/15/2020 3:51:10 PM By: Carlene Coria RN Entered By: Carlene Coria on 12/15/2020 13:54:45 Jeanne Pittman ([id number]) -------------------------------------------------------------------------------- Multi Wound Chart Details Patient Name: Jeanne Pittman Date of Service: 12/15/2020 1:45 PM Medical Record Number: [id number] Patient Account Number: [id number] Date of Birth/Sex: August 14, 1935 (86 y.o. F) Treating RN: Cornell Barman Primary Care Provider: Fulton Reek Other Clinician: Jeanine Luz Referring Provider: Fulton Reek Treating Provider/Extender: Tito Dine in Treatment: 2 Vital Signs Height(in): 61.5 Pulse(bpm): 26 Weight(lbs): 103 Blood  Pressure(mmHg): 137/66 Body Mass Index(BMI): 19 Temperature(F): 97.6 Respiratory Rate(breaths/min): 16 Photos: [N/A:N/A] Wound Location: Midline Back N/A N/A Wounding Event: Other Lesion N/A N/A Primary Etiology: Atypical N/A N/A Comorbid History: Glaucoma, Arrhythmia, Congestive N/A N/A Heart Failure, Coronary Artery Disease, Hypertension, Osteoarthritis, Received Chemotherapy Date Acquired: 10/05/2020 N/A N/A Weeks of Treatment: 2 N/A N/A Wound Status: Open N/A N/A Clustered Wound: Yes N/A N/A Measurements L x W x D (cm) 3x2.3x0.1 N/A N/A  Area (cm) : 5.419 N/A N/A Volume (cm) : 0.542 N/A N/A % Reduction in Area: 96.20% N/A N/A % Reduction in Volume: 96.20% N/A N/A Classification: Full Thickness Without Exposed N/A N/A Support Structures Exudate Amount: Medium N/A N/A Exudate Type: Serous N/A N/A Exudate Color: amber N/A N/A Wound Margin: Flat and Intact N/A N/A Granulation Amount: Large (67-100%) N/A N/A Granulation Quality: Pink N/A N/A Necrotic Amount: None Present (0%) N/A N/A Exposed Structures: Fascia: No N/A N/A Fat Layer (Subcutaneous Tissue): No Tendon: No Muscle: No Joint: No Bone: No Limited to Skin Breakdown Epithelialization: Large (67-100%) N/A N/A Treatment Notes Wound #1 (Back) Wound Laterality: Midline Cleanser Jeanne Pittman ([id number]) Kyung Rudd Ancillary Kit - 15 Day Supply Discharge Instruction: Use supplies as instructed; Kit contains: (15) Saline Bullets; (15) 3x3 Gauze; 15 pr Gloves Normal Saline Discharge Instruction: Wash your hands with soap and water. Remove old dressing, discard into plastic bag and place into trash. Cleanse the wound with Normal Saline prior to applying a clean dressing using gauze sponges, not tissues or cotton balls. Do not scrub or use excessive force. Pat dry using gauze sponges, not tissue or cotton balls. Peri-Wound Care Topical Primary Dressing Xeroform 5x9-HBD (in/in) Discharge Instruction: Apply Xeroform  5x9-HBD (in/in) as directed Secondary Dressing ABD Pad 5x9 (in/in) Discharge Instruction: Cover with ABD pad Secured With 38M Medipore H Soft Cloth Surgical Tape, 2x2 (in/yd) Compression Wrap Compression Stockings Add-Ons Electronic Signature(s) Signed: 12/15/2020 5:04:28 PM By: Linton Ham MD Entered By: Linton Ham on 12/15/2020 14:22:51 Jeanne Pittman ([id number]) -------------------------------------------------------------------------------- [place] Details Patient Name: Jeanne Pittman Date of Service: 12/15/2020 1:45 PM Medical Record Number: [id number] Patient Account Number: [id number] Date of Birth/Sex: 1934/09/21 (86 y.o. F) Treating RN: Cornell Barman Primary Care Wilmont Olund: Fulton Reek Other Clinician: Jeanine Luz Referring Odies Desa: Fulton Reek Treating Sylvie Mifsud/Extender: Tito Dine in Treatment: 2 Active Inactive Electronic Signature(s) Signed: 01/04/2021 3:42:56 PM By: Gretta Cool, BSN, RN, CWS, Kim RN, BSN Previous Signature: 12/15/2020 4:08:37 PM Version By: Gretta Cool, BSN, RN, CWS, Kim RN, BSN Entered By: Gretta Cool, BSN, RN, CWS, Kim on 01/04/2021 15:42:55 Jeanne Pittman ([id number]) -------------------------------------------------------------------------------- Pain Assessment Details Patient Name: Jeanne Pittman Date of Service: 12/15/2020 1:45 PM Medical Record Number: [id number] Patient Account Number: [id number] Date of Birth/Sex: 08-18-1935 (86 y.o. F) Treating RN: Carlene Coria Primary Care Keiry Kowal: Fulton Reek Other Clinician: Jeanine Luz Referring Saban Heinlen: Fulton Reek Treating Giovanna Kemmerer/Extender: Tito Dine in Treatment: 2 Active Problems Location of Pain Severity and Description of Pain Patient Has Paino No Site Locations Pain Management and Medication Current Pain Management: Electronic Signature(s) Signed: 12/15/2020 3:51:10 PM By: Carlene Coria RN Entered By: Carlene Coria on  12/15/2020 13:52:53 Jeanne Pittman ([id number]) -------------------------------------------------------------------------------- Patient/Caregiver Education Details Patient Name: Jeanne Pittman Date of Service: 12/15/2020 1:45 PM Medical Record Number: [id number] Patient Account Number: [id number] Date of Birth/Gender: 10-08-1934 (86 y.o. F) Treating RN: Cornell Barman Primary Care Physician: Fulton Reek Other Clinician: Jeanine Luz Referring Physician: Fulton Reek Treating Physician/Extender: Tito Dine in Treatment: 2 Education Assessment Education Provided To: Patient Education Topics Provided Welcome To [facility]: Handouts: [facility] Methods: Demonstration, Explain/Verbal Responses: State content correctly Wound/Skin Impairment: Handouts: Caring for Your Ulcer Methods: Demonstration, Explain/Verbal Responses: State content correctly Electronic Signature(s) Signed: 12/15/2020 4:08:37 PM By: Gretta Cool, BSN, RN, CWS, Kim RN, BSN Entered By: Gretta Cool, BSN, RN, CWS, Kim on 12/15/2020 14:08:32 Jeanne Pittman ([id number]) -------------------------------------------------------------------------------- Wound Assessment Details Patient Name:  Jeanne Pittman Date of Service: 12/15/2020 1:45 PM Medical Record Number: [id number] Patient Account Number: [id number] Date of Birth/Sex: 1935/01/02 (86 y.o. F) Treating RN: Carlene Coria Primary Care Provider: Fulton Reek Other Clinician: Jeanine Luz Referring Provider: Fulton Reek Treating Provider/Extender: Tito Dine in Treatment: 2 Wound Status Wound Number: 1 Primary Atypical Etiology: Wound Location: Midline Back Wound Open Wounding Event: Other Lesion Status: Date Acquired: 10/05/2020 Comorbid Glaucoma, Arrhythmia, Congestive Heart Failure, Weeks Of Treatment: 2 History: Coronary Artery Disease, Hypertension, Osteoarthritis, Clustered Wound:  Yes Received Chemotherapy Photos Wound Measurements Length: (cm) 3 Width: (cm) 2.3 Depth: (cm) 0.1 Area: (cm) 5.419 Volume: (cm) 0.542 % Reduction in Area: 96.2% % Reduction in Volume: 96.2% Epithelialization: Large (67-100%) Tunneling: No Undermining: No Wound Description Classification: Full Thickness Without Exposed Support Structures Wound Margin: Flat and Intact Exudate Amount: Medium Exudate Type: Serous Exudate Color: amber Foul Odor After Cleansing: No Slough/Fibrino No Wound Bed Granulation Amount: Large (67-100%) Exposed Structure Granulation Quality: Pink Fascia Exposed: No Necrotic Amount: None Present (0%) Fat Layer (Subcutaneous Tissue) Exposed: No Tendon Exposed: No Muscle Exposed: No Joint Exposed: No Bone Exposed: No Limited to Skin Breakdown Electronic Signature(s) Signed: 12/15/2020 3:51:10 PM By: Carlene Coria RN Entered By: Carlene Coria on 12/15/2020 13:54:08 Jeanne Pittman ([id number]) -------------------------------------------------------------------------------- Vitals Details Patient Name: Jeanne Pittman Date of Service: 12/15/2020 1:45 PM Medical Record Number: [id number] Patient Account Number: [id number] Date of Birth/Sex: 1935/05/26 (86 y.o. F) Treating RN: Carlene Coria Primary Care Provider: Fulton Reek Other Clinician: Jeanine Luz Referring Provider: Fulton Reek Treating Provider/Extender: Tito Dine in Treatment: 2 Vital Signs Time Taken: 13:52 Temperature (F): 97.6 Height (in): 61.5 Pulse (bpm): 64 Weight (lbs): 103 Respiratory Rate (breaths/min): 16 Body Mass Index (BMI): 19.1 Blood Pressure (mmHg): 137/66 Reference Range: 80 - 120 mg / dl Electronic Signature(s) Signed: 12/15/2020 3:51:10 PM By: Carlene Coria RN Entered By: Carlene Coria on 12/15/2020 13:52:46

## 2021-01-05 ENCOUNTER — Ambulatory Visit: Payer: Medicare Other | Admitting: Internal Medicine

## 2021-02-14 ENCOUNTER — Other Ambulatory Visit: Payer: Self-pay | Admitting: Cardiovascular Disease

## 2021-02-25 ENCOUNTER — Other Ambulatory Visit: Payer: Self-pay | Admitting: Nurse Practitioner

## 2021-03-11 ENCOUNTER — Other Ambulatory Visit: Payer: Self-pay | Admitting: Internal Medicine

## 2021-03-11 DIAGNOSIS — Z1231 Encounter for screening mammogram for malignant neoplasm of breast: Secondary | ICD-10-CM

## 2021-03-18 ENCOUNTER — Ambulatory Visit
Admission: RE | Admit: 2021-03-18 | Discharge: 2021-03-18 | Disposition: A | Discharge status: Home / Self Care | Payer: Medicare Other | Source: Ambulatory Visit | Attending: Internal Medicine | Admitting: Internal Medicine

## 2021-03-18 ENCOUNTER — Other Ambulatory Visit: Payer: Self-pay

## 2021-03-18 DIAGNOSIS — Z1231 Encounter for screening mammogram for malignant neoplasm of breast: Secondary | ICD-10-CM

## 2021-03-20 ENCOUNTER — Other Ambulatory Visit: Payer: Self-pay | Admitting: Cardiovascular Disease

## 2021-03-24 ENCOUNTER — Ambulatory Visit
Admission: RE | Admit: 2021-03-24 | Discharge: 2021-03-24 | Disposition: A | Discharge status: Home / Self Care | Payer: Medicare Other | Source: Ambulatory Visit | Attending: Internal Medicine | Admitting: Internal Medicine

## 2021-03-24 ENCOUNTER — Other Ambulatory Visit: Payer: Self-pay

## 2021-03-24 ENCOUNTER — Other Ambulatory Visit: Payer: Self-pay | Admitting: Internal Medicine

## 2021-03-24 DIAGNOSIS — C8304 Small cell B-cell lymphoma, lymph nodes of axilla and upper limb: Secondary | ICD-10-CM | POA: Insufficient documentation

## 2021-03-24 DIAGNOSIS — R928 Other abnormal and inconclusive findings on diagnostic imaging of breast: Secondary | ICD-10-CM

## 2021-03-24 DIAGNOSIS — I700 Atherosclerosis of aorta: Secondary | ICD-10-CM | POA: Diagnosis not present

## 2021-03-24 DIAGNOSIS — R918 Other nonspecific abnormal finding of lung field: Secondary | ICD-10-CM | POA: Insufficient documentation

## 2021-03-24 DIAGNOSIS — I2510 Atherosclerotic heart disease of native coronary artery without angina pectoris: Secondary | ICD-10-CM | POA: Diagnosis not present

## 2021-03-24 DIAGNOSIS — N6489 Other specified disorders of breast: Secondary | ICD-10-CM

## 2021-03-24 LAB — GLUCOSE, CAPILLARY: Glucose-Capillary: 90 mg/dL (ref 70–99)

## 2021-03-24 MED ORDER — FLUDEOXYGLUCOSE F - 18 (FDG) INJECTION
5.2000 | Freq: Once | INTRAVENOUS | Status: AC | PRN
  Administered 2021-03-24: 5.54 via INTRAVENOUS

## 2021-03-25 ENCOUNTER — Inpatient Hospital Stay (HOSPITAL_BASED_OUTPATIENT_CLINIC_OR_DEPARTMENT_OTHER): Payer: Medicare Other | Admitting: Internal Medicine

## 2021-03-25 ENCOUNTER — Inpatient Hospital Stay: Payer: Medicare Other | Attending: Internal Medicine

## 2021-03-25 DIAGNOSIS — I5042 Chronic combined systolic (congestive) and diastolic (congestive) heart failure: Secondary | ICD-10-CM | POA: Insufficient documentation

## 2021-03-25 DIAGNOSIS — C8304 Small cell B-cell lymphoma, lymph nodes of axilla and upper limb: Secondary | ICD-10-CM

## 2021-03-25 DIAGNOSIS — I2510 Atherosclerotic heart disease of native coronary artery without angina pectoris: Secondary | ICD-10-CM | POA: Insufficient documentation

## 2021-03-25 DIAGNOSIS — Z8719 Personal history of other diseases of the digestive system: Secondary | ICD-10-CM | POA: Diagnosis not present

## 2021-03-25 DIAGNOSIS — Z806 Family history of leukemia: Secondary | ICD-10-CM | POA: Diagnosis not present

## 2021-03-25 DIAGNOSIS — R630 Anorexia: Secondary | ICD-10-CM | POA: Insufficient documentation

## 2021-03-25 DIAGNOSIS — Z885 Allergy status to narcotic agent status: Secondary | ICD-10-CM | POA: Diagnosis not present

## 2021-03-25 DIAGNOSIS — R5383 Other fatigue: Secondary | ICD-10-CM | POA: Diagnosis not present

## 2021-03-25 DIAGNOSIS — Z681 Body mass index (BMI) 19 or less, adult: Secondary | ICD-10-CM | POA: Insufficient documentation

## 2021-03-25 DIAGNOSIS — Z8249 Family history of ischemic heart disease and other diseases of the circulatory system: Secondary | ICD-10-CM | POA: Insufficient documentation

## 2021-03-25 DIAGNOSIS — Z888 Allergy status to other drugs, medicaments and biological substances status: Secondary | ICD-10-CM | POA: Diagnosis not present

## 2021-03-25 DIAGNOSIS — I252 Old myocardial infarction: Secondary | ICD-10-CM | POA: Insufficient documentation

## 2021-03-25 DIAGNOSIS — Z9049 Acquired absence of other specified parts of digestive tract: Secondary | ICD-10-CM | POA: Diagnosis not present

## 2021-03-25 DIAGNOSIS — M549 Dorsalgia, unspecified: Secondary | ICD-10-CM | POA: Diagnosis not present

## 2021-03-25 DIAGNOSIS — Z79899 Other long term (current) drug therapy: Secondary | ICD-10-CM | POA: Insufficient documentation

## 2021-03-25 DIAGNOSIS — Z86010 Personal history of colonic polyps: Secondary | ICD-10-CM | POA: Diagnosis not present

## 2021-03-25 DIAGNOSIS — Z8572 Personal history of non-Hodgkin lymphomas: Secondary | ICD-10-CM | POA: Diagnosis present

## 2021-03-25 DIAGNOSIS — Z87891 Personal history of nicotine dependence: Secondary | ICD-10-CM | POA: Diagnosis not present

## 2021-03-25 DIAGNOSIS — Z90722 Acquired absence of ovaries, bilateral: Secondary | ICD-10-CM | POA: Diagnosis not present

## 2021-03-25 DIAGNOSIS — M2550 Pain in unspecified joint: Secondary | ICD-10-CM | POA: Diagnosis not present

## 2021-03-25 DIAGNOSIS — R634 Abnormal weight loss: Secondary | ICD-10-CM | POA: Insufficient documentation

## 2021-03-25 DIAGNOSIS — I4891 Unspecified atrial fibrillation: Secondary | ICD-10-CM | POA: Insufficient documentation

## 2021-03-25 DIAGNOSIS — D696 Thrombocytopenia, unspecified: Secondary | ICD-10-CM | POA: Diagnosis not present

## 2021-03-25 LAB — COMPREHENSIVE METABOLIC PANEL
ALT: 14 U/L (ref 0–44)
AST: 17 U/L (ref 15–41)
Albumin: 4.1 g/dL (ref 3.5–5.0)
Alkaline Phosphatase: 52 U/L (ref 38–126)
Anion gap: 9 (ref 5–15)
BUN: 26 mg/dL — ABNORMAL HIGH (ref 8–23)
CO2: 25 mmol/L (ref 22–32)
Calcium: 9.2 mg/dL (ref 8.9–10.3)
Chloride: 102 mmol/L (ref 98–111)
Creatinine, Ser: 1 mg/dL (ref 0.44–1.00)
GFR, Estimated: 56 mL/min — ABNORMAL LOW (ref >60)
Glucose, Bld: 130 mg/dL — ABNORMAL HIGH (ref 70–99)
Potassium: 4.1 mmol/L (ref 3.5–5.1)
Sodium: 136 mmol/L (ref 135–145)
Total Bilirubin: 0.9 mg/dL (ref 0.3–1.2)
Total Protein: 7.2 g/dL (ref 6.5–8.1)

## 2021-03-25 LAB — CBC WITH DIFFERENTIAL/PLATELET
Abs Immature Granulocytes: 0.02 10*3/uL (ref 0.00–0.07)
Basophils Absolute: 0.1 10*3/uL (ref 0.0–0.1)
Basophils Relative: 1 %
Eosinophils Absolute: 0.1 10*3/uL (ref 0.0–0.5)
Eosinophils Relative: 2 %
HCT: 35.6 % — ABNORMAL LOW (ref 36.0–46.0)
Hemoglobin: 11.9 g/dL — ABNORMAL LOW (ref 12.0–15.0)
Immature Granulocytes: 0 %
Lymphocytes Relative: 57 %
Lymphs Abs: 4.9 10*3/uL — ABNORMAL HIGH (ref 0.7–4.0)
MCH: 32.6 pg (ref 26.0–34.0)
MCHC: 33.4 g/dL (ref 30.0–36.0)
MCV: 97.5 fL (ref 80.0–100.0)
Monocytes Absolute: 0.3 10*3/uL (ref 0.1–1.0)
Monocytes Relative: 4 %
Neutro Abs: 3 10*3/uL (ref 1.7–7.7)
Neutrophils Relative %: 36 %
Platelets: 135 10*3/uL — ABNORMAL LOW (ref 150–400)
RBC: 3.65 MIL/uL — ABNORMAL LOW (ref 3.87–5.11)
RDW: 13.5 % (ref 11.5–15.5)
WBC: 8.4 10*3/uL (ref 4.0–10.5)
nRBC: 0 % (ref 0.0–0.2)

## 2021-03-25 LAB — LACTATE DEHYDROGENASE: LDH: 99 U/L (ref 98–192)

## 2021-03-25 NOTE — Progress Notes (Signed)
Patient had shingles in February. She states that the scars are still healing. She is following up with dermatology.  She is loosing weight. She reports an esophogeal stretching approximately 4 months ago to resolve a restrictions. She is not eating eating very well per pt's son. She drinks 1 protein shake a day. Her meals are very small portions and 2 snacks per day. She reports occasional episode of diarrhea. She believes dairy products cause this- lactose intolerant.  Pt reports easy bruising.

## 2021-03-25 NOTE — Assessment & Plan Note (Addendum)
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 Status post multiple lines of therapy in the past; PET scan- July 2022- Signs of nodal enlargement, mild nodal enlargement in the chest and abdomen as well as the pelvis with slight increase in general of SUVvalues in the range of Deauville 3/Deauville 4, greatest trend of worsening in the abdomen and pelvis.  Lung nodules-right upper lobe-perifissural suspicious of lymphoma rather than infection/inflammation.  #Given patient's ongoing fatigue/weight loss suspect lymphoma as a cause.  However other, causes for fatigue/weight loss need to be considered [see discussion below].  Discussed with patient's son regarding repeating CT scan imaging in 3 months.  And if continued weight loss no improvement in the fatigue-recommend rituximab weekly x4.  However as per the son patient concerned about patient recommended proceeding with any therapy anytime soon.  We will reevaluate in 3 months; in agreement.  # Severe Shingles [FEB 2022]/currently post herpetic neuralgia-patient will need shingles prophylaxis./Consider EVUSHELD-if patient proceed with rituximab.  # Mild thrombocytopenia- 139 STABLE.  monitor for now.    ## Weight loss: 6-7 pounds in 12 months; poor apetite/ Fatigue-chronic lymphoma versus others.  Worsening; clinically suspect lymphoma based on imaging.  However would have patient evaluated by nutrition-also await if any improvement in the next 3 months before proceeding with any treatment.  # DISPOSITION:  # referral to [facility] re: weight loss # Follow-up TBD- - Dr.B  # I reviewed the blood work- with the patient in detail; also reviewed the imaging independently [as summarized above]; and with the patient in detail.

## 2021-03-26 ENCOUNTER — Other Ambulatory Visit: Payer: Self-pay | Admitting: Internal Medicine

## 2021-03-26 DIAGNOSIS — C8304 Small cell B-cell lymphoma, lymph nodes of axilla and upper limb: Secondary | ICD-10-CM

## 2021-03-26 DIAGNOSIS — R918 Other nonspecific abnormal finding of lung field: Secondary | ICD-10-CM

## 2021-03-26 NOTE — Progress Notes (Signed)
[place] OFFICE PROGRESS NOTE  Patient Care Team: [name], MD as PCP - General (Unknown Physician Specialty) Wellington Hampshire, MD as PCP - Cardiology (Cardiology) Byrnett, Forest Gleason, MD (General Surgery) Forest Gleason, MD (Inactive) (Oncology) Wellington Hampshire, MD as Consulting Physician (Cardiology) Cammie Sickle, MD as Consulting Physician (Hematology and Oncology)  Cancer Staging No matching staging information was found for the patient.    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;  Rituxan once a week started in January of 2013  # July 2016-RECURRENCE- LYMPH NODE, LEFT AXILLA; EXCISIONAL BIOPSY:  - LOW-GRADE B-CELL LYMPHOMA, IMMUNOPHENOTYPICALLY MOST CONSISTENT WITH CLL/SLL, PET April 2018- STABLE/slight progression  # acute MI in December of 2014; #  upper and lower endoscopy done in May of 2015 ---------------------------------------------------   DIAGNOSIS: Follicular/MARGINAL ZONE LYMPHOMA/ SLL   STAGE:   IV   ;GOALS: control  CURRENT/MOST RECENT THERAPY : surveillance     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  female pleasant patient above history of recurrent low-grade lymphoma/B cell non-Hodgkin type is here for follow-up/review results of the PET scan.  Patient complains of worsening fatigue.  Also complains of worsening weight loss.  Patient is currently 97 pounds lost over 10 pounds in the last 1 year.  Complains of poor appetite.  No nausea no vomiting.  Patient s/p  esophagus dilatation-no difficulty swallowing.  No night sweats.  No fevers or chills.   Review of Systems  Constitutional:  Positive for malaise/fatigue and weight loss. Negative for chills, diaphoresis and fever.  HENT:  Negative for nosebleeds and sore throat.   Eyes:  Negative for double vision.  Respiratory:  Negative for cough, hemoptysis, sputum production, shortness of breath and wheezing.   Cardiovascular:  Negative for chest pain, palpitations, orthopnea and leg swelling.  Gastrointestinal:  Negative for abdominal pain, blood in stool, constipation, diarrhea, heartburn, melena, nausea and vomiting.  Genitourinary:  Negative for dysuria, frequency and urgency.  Musculoskeletal:  Positive for back pain and joint pain.  Skin: Negative.  Negative for itching and rash.  Neurological:  Negative for dizziness, tingling, focal weakness, weakness and headaches.  Endo/Heme/Allergies:  Does not bruise/bleed easily.  Psychiatric/Behavioral:  Negative for depression. The patient is not nervous/anxious and does not have insomnia.    PAST MEDICAL HISTORY :  Past Medical History:  Diagnosis Date   A-fib ([place])    07/2012: A. fib with RVR in the setting of myocardial infarction. Converted to sinus rhythm with amiodarone. No recurrence.   Anxiety    Atrophic vaginitis    Chronic combined systolic (congestive) and diastolic (congestive) heart failure ([place])    a. EF was 25-35% post MI but improved to 45-50% in 04/2013; b. 03/2017 Echo: EF 40-45%, mod focal/basal hypertrophy of septum. Sev mid-apicalanteroseptal, ant, and apical HK. Gr1 DD, mild AI/MR, mod TR, PASP 69mg.   Colon adenomas    Coronary artery disease    a. 199991111STEMI complicated by cardiogenic shock. Cath/PCI: LAD 101mDESx2), RCA 95p(DES). EF 25%; b. 03/2017 MV:  EF 57%, fixed apical, periapical, mid to distal anteroseptal defect. No ischemia.   Eczema    Fibrocystic breast disease    Gastritis    GERD (gastroesophageal reflux  disease)    Glaucoma    Headache    Hyperlipidemia    Hypertension    Hypothyroidism    Insomnia    Ischemic cardiomyopathy    a. 07/2012 EF 25-35% following MI-->Improved to 45-50%;  b. 03/2017 Echo: EF 40-45%.   Myocardial infarction ([place]) 08/18/2014   Non Hodgkin's lymphoma ([place]) 2005   reoccurance 2007 and 2012   Osteoarthritis    Osteoporosis    Polyneuropathy    Polyposis of colon    Restless leg syndrome    Shingles    right   Urinary, incontinence, stress female     PAST SURGICAL HISTORY :   Past Surgical History:  Procedure Laterality Date   ABDOMINAL HYSTERECTOMY  1956   APPENDECTOMY     AXILLARY LYMPH NODE BIOPSY Left 03/18/2015   Procedure: AXILLARY LYMPH NODE BIOPSY;  Surgeon: [name], MD;  Location: [name];  Service: General;  Laterality: Left;   CARDIAC CATHETERIZATION  08/19/2012   [name]; [facility]   COLONOSCOPY     COLONOSCOPY WITH PROPOFOL N/A 03/02/2016   Procedure: COLONOSCOPY WITH PROPOFOL;  Surgeon: Manya Silvas, MD;  Location: [facility] ENDOSCOPY;  Service: Endoscopy;  Laterality: N/A;   COLONOSCOPY WITH PROPOFOL N/A 09/26/2017   Procedure: COLONOSCOPY WITH PROPOFOL;  Surgeon: Manya Silvas, MD;  Location: [facility] ENDOSCOPY;  Service: Endoscopy;  Laterality: N/A;   COLONOSCOPY WITH PROPOFOL N/A 03/11/2020   Procedure: COLONOSCOPY WITH PROPOFOL;  Surgeon: Benjamine Sprague, DO;  Location: [name] ENDOSCOPY;  Service: General;  Laterality: N/A;   CORONARY ANGIOPLASTY WITH STENT PLACEMENT     x3 stents   CORONARY ANGIOPLASTY WITH STENT PLACEMENT     CORONARY ARTERY BYPASS GRAFT     ESOPHAGOGASTRODUODENOSCOPY (EGD) WITH PROPOFOL N/A 03/02/2016   Procedure: ESOPHAGOGASTRODUODENOSCOPY (EGD) WITH PROPOFOL;  Surgeon: Manya Silvas, MD;  Location: [facility] ENDOSCOPY;  Service: Endoscopy;  Laterality: N/A;   ESOPHAGOGASTRODUODENOSCOPY (EGD) WITH PROPOFOL N/A 04/14/2019   Procedure: ESOPHAGOGASTRODUODENOSCOPY (EGD) WITH PROPOFOL;  Surgeon: Toledo, Benay Pike, MD;   Location: [name] ENDOSCOPY;  Service: Gastroenterology;  Laterality: N/A;   ESOPHAGOGASTRODUODENOSCOPY (EGD) WITH PROPOFOL N/A 12/07/2020   Procedure: ESOPHAGOGASTRODUODENOSCOPY (EGD) WITH PROPOFOL;  Surgeon: Lesly Rubenstein, MD;  Location: [name] ENDOSCOPY;  Service: Endoscopy;  Laterality: N/A;   LYMPH NODE BIOPSY Right 07-24-11   Dr Bary Castilla   MOHS SURGERY     bilateral shoulders   PTCA     skin cancer removal     TOTAL ABDOMINAL HYSTERECTOMY W/ BILATERAL SALPINGOOPHORECTOMY      FAMILY HISTORY :   Family History  Problem Relation Age of Onset   Heart attack Father    Heart disease Brother    Leukemia Mother    Bladder Cancer Neg Hx    Prostate cancer Neg Hx    Kidney cancer Neg Hx    Breast cancer Neg Hx     SOCIAL HISTORY:   Social History   Tobacco Use   Smoking status: Former    Types: Cigarettes    Quit date: 1950    Years since quitting: 72.6   Smokeless tobacco: Never  Vaping Use   Vaping Use: Never used  Substance Use Topics   Alcohol use: No    Alcohol/week: 0.0 standard drinks   Drug use: No    ALLERGIES:  is  allergic to ace inhibitors, benadryl [diphenhydramine hcl], codeine, diphenhydramine, guaiacol, hydrocodone, cardizem [diltiazem], and guaifenesin & derivatives.  MEDICATIONS:  Current Outpatient Medications  Medication Sig Dispense Refill   acetaminophen (TYLENOL) 650 MG CR tablet Take 650 mg by mouth every 8 (eight) hours as needed for pain.     aspirin 81 MG tablet Take 81 mg by mouth every morning.      carvedilol (COREG) 6.25 MG tablet Take 1 tablet (6.25 mg total) by mouth 2 (two) times daily with a meal. 180 tablet 1   Cholecalciferol (VITAMIN D-3) 5000 UNITS TABS Take 2,000 Int'l Units by mouth every morning.      DULoxetine (CYMBALTA) 20 MG capsule Take 20 mg by mouth daily.     fexofenadine (ALLEGRA) 180 MG tablet Take 180 mg by mouth daily as needed for rhinitis.      ibuprofen (ADVIL) 800 MG tablet Take 800 mg by mouth every 6 (six) hours  as needed.     latanoprost (XALATAN) 0.005 % ophthalmic solution Place 1 drop into both eyes at bedtime.     LORazepam (ATIVAN) 0.5 MG tablet Take 0.5 mg by mouth every 8 (eight) hours as needed for anxiety.      losartan (COZAAR) 25 MG tablet TAKE 1/2 TABLET(12.5 MG) BY MOUTH DAILY AFTER BREAKFAST 45 tablet 3   pantoprazole (PROTONIX) 40 MG tablet TAKE 1 TABLET(40 MG) BY MOUTH TWICE DAILY 180 tablet 1   rosuvastatin (CRESTOR) 10 MG tablet TAKE 1 TABLET(10 MG) BY MOUTH EVERY OTHER DAY 45 tablet 0   timolol (TIMOPTIC) 0.5 % ophthalmic solution Place 1 drop into both eyes 2 (two) times daily.     No current facility-administered medications for this visit.    PHYSICAL EXAMINATION: ECOG PERFORMANCE STATUS: 0 - Asymptomatic  BP 125/64   Pulse 70   Temp (!) 97.4 F (36.3 C) (Tympanic)   Resp 20   Ht 5' (1.524 m)   Wt 97 lb 6.4 oz (44.2 kg)   BMI 19.02 kg/m   Filed Weights   03/25/21 1320  Weight: 97 lb 6.4 oz (44.2 kg)    Physical Exam Constitutional:      Comments: She is accompanied by her son.  Ambulating independently.  HENT:     Head: Normocephalic and atraumatic.     Mouth/Throat:     Pharynx: No oropharyngeal exudate.  Eyes:     Pupils: Pupils are equal, round, and reactive to light.  Neck:     Comments: Bilateral neck/axillary adenopathy. Cardiovascular:     Rate and Rhythm: Normal rate and regular rhythm.  Pulmonary:     Effort: No respiratory distress.     Breath sounds: No wheezing.  Abdominal:     General: Bowel sounds are normal. There is no distension.     Palpations: Abdomen is soft. There is no mass.     Tenderness: There is no abdominal tenderness. There is no guarding or rebound.  Musculoskeletal:        General: No tenderness. Normal range of motion.     Cervical back: Normal range of motion and neck supple.  Lymphadenopathy:     Comments: Bilateral axillary adenopathy 1-2 cm in size.   Skin:    General: Skin is warm.  Neurological:     Mental  Status: She is alert and oriented to person, place, and time.  Psychiatric:        Mood and Affect: Affect normal.     LABORATORY DATA:  I have reviewed the  data as listed    Component Value Date/Time   NA 136 03/25/2021 1301   NA 141 09/07/2014 1449   K 4.1 03/25/2021 1301   K 4.0 09/07/2014 1449   CL 102 03/25/2021 1301   CL 106 09/07/2014 1449   CO2 25 03/25/2021 1301   CO2 29 09/07/2014 1449   GLUCOSE 130 (H) 03/25/2021 1301   GLUCOSE 109 (H) 09/07/2014 1449   BUN 26 (H) 03/25/2021 1301   BUN 19 (H) 09/07/2014 1449   CREATININE 1.00 03/25/2021 1301   CREATININE 1.01 09/07/2014 1449   CALCIUM 9.2 03/25/2021 1301   CALCIUM 8.8 09/07/2014 1449   PROT 7.2 03/25/2021 1301   PROT 6.3 09/02/2015 0824   PROT 6.5 09/07/2014 1449   ALBUMIN 4.1 03/25/2021 1301   ALBUMIN 4.2 09/02/2015 0824   ALBUMIN 3.8 09/07/2014 1449   AST 17 03/25/2021 1301   AST 11 (L) 09/07/2014 1449   ALT 14 03/25/2021 1301   ALT 22 09/07/2014 1449   ALKPHOS 52 03/25/2021 1301   ALKPHOS 75 09/07/2014 1449   BILITOT 0.9 03/25/2021 1301   BILITOT 0.4 09/02/2015 0824   BILITOT 0.8 09/07/2014 1449   GFRNONAA 56 (L) 03/25/2021 1301   GFRNONAA 56 (L) 09/07/2014 1449   GFRNONAA 44 (L) 02/09/2014 1351   GFRAA 59 (L) 02/27/2020 1023   GFRAA >60 09/07/2014 1449   GFRAA 51 (L) 02/09/2014 1351    No results found for: SPEP, UPEP  Lab Results  Component Value Date   WBC 8.4 03/25/2021   NEUTROABS 3.0 03/25/2021   HGB 11.9 (L) 03/25/2021   HCT 35.6 (L) 03/25/2021   MCV 97.5 03/25/2021   PLT 135 (L) 03/25/2021      Chemistry      Component Value Date/Time   NA 136 03/25/2021 1301   NA 141 09/07/2014 1449   K 4.1 03/25/2021 1301   K 4.0 09/07/2014 1449   CL 102 03/25/2021 1301   CL 106 09/07/2014 1449   CO2 25 03/25/2021 1301   CO2 29 09/07/2014 1449   BUN 26 (H) 03/25/2021 1301   BUN 19 (H) 09/07/2014 1449   CREATININE 1.00 03/25/2021 1301   CREATININE 1.01 09/07/2014 1449      Component  Value Date/Time   CALCIUM 9.2 03/25/2021 1301   CALCIUM 8.8 09/07/2014 1449   ALKPHOS 52 03/25/2021 1301   ALKPHOS 75 09/07/2014 1449   AST 17 03/25/2021 1301   AST 11 (L) 09/07/2014 1449   ALT 14 03/25/2021 1301   ALT 22 09/07/2014 1449   BILITOT 0.9 03/25/2021 1301   BILITOT 0.4 09/02/2015 0824   BILITOT 0.8 09/07/2014 1449      RADIOGRAPHIC STUDIES: I have personally reviewed the radiological images as listed and agreed with the findings in the report. No results found.   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 Status post multiple lines of therapy in the past; PET scan- July 2022- Signs of nodal enlargement, mild nodal enlargement in the chest and abdomen as well as the pelvis with slight increase in general of SUVvalues in the range of Deauville 3/Deauville 4, greatest trend of worsening in the abdomen and pelvis.  Lung nodules-right upper lobe-perifissural suspicious of lymphoma rather than infection/inflammation.  #Given patient's ongoing fatigue/weight loss suspect lymphoma as a cause.  However other, causes for fatigue/weight loss need to be considered [see discussion below].  Discussed with patient's son  regarding repeating CT scan imaging in 3 months.  And if continued weight loss no improvement in the fatigue-recommend rituximab weekly x4.  However as per the son patient concerned about patient recommended proceeding with any therapy anytime soon.  We will reevaluate in 3 months; in agreement.  # Severe Shingles [FEB 2022]/currently post herpetic neuralgia-patient will need shingles prophylaxis./Consider EVUSHELD-if patient proceed with rituximab.  # Mild thrombocytopenia- 139 STABLE.  monitor for now.    ## Weight loss: 6-7 pounds in 12 months; poor apetite/ Fatigue-chronic lymphoma versus others.  Worsening; clinically suspect lymphoma based on  imaging.  However would have patient evaluated by nutrition-also await if any improvement in the next 3 months before proceeding with any treatment.  # DISPOSITION:  # referral to [facility] re: weight loss # Follow-up TBD- - Dr.B  # I reviewed the blood work- with the patient in detail; also reviewed the imaging independently [as summarized above]; and with the patient in detail.        No orders of the defined types were placed in this encounter.  All questions were answered. The patient knows to call the clinic with any problems, questions or concerns.      Cammie Sickle, MD 03/26/2021 8:54 PM

## 2021-03-26 NOTE — Progress Notes (Signed)
C- please schedule appts in 3 months- MD; labs- cbc/cmp/LDH; CT chest prior-Dr.B

## 2021-03-28 NOTE — Addendum Note (Signed)
Addended by: Gloris Ham on: 03/28/2021 11:02 AM   Modules accepted: Orders

## 2021-03-29 ENCOUNTER — Other Ambulatory Visit: Payer: Self-pay

## 2021-03-29 ENCOUNTER — Encounter: Payer: Self-pay | Admitting: Cardiovascular Disease

## 2021-03-29 ENCOUNTER — Ambulatory Visit (INDEPENDENT_AMBULATORY_CARE_PROVIDER_SITE_OTHER): Payer: Medicare Other | Admitting: Cardiovascular Disease

## 2021-03-29 VITALS — BP 128/58 | HR 64 | Ht 61.0 in | Wt 97.2 lb

## 2021-03-29 DIAGNOSIS — I5022 Chronic systolic (congestive) heart failure: Secondary | ICD-10-CM | POA: Diagnosis not present

## 2021-03-29 DIAGNOSIS — I25118 Atherosclerotic heart disease of native coronary artery with other forms of angina pectoris: Secondary | ICD-10-CM | POA: Diagnosis not present

## 2021-03-29 DIAGNOSIS — I255 Ischemic cardiomyopathy: Secondary | ICD-10-CM | POA: Diagnosis not present

## 2021-03-29 DIAGNOSIS — E785 Hyperlipidemia, unspecified: Secondary | ICD-10-CM

## 2021-03-29 DIAGNOSIS — I480 Paroxysmal atrial fibrillation: Secondary | ICD-10-CM | POA: Diagnosis not present

## 2021-03-29 NOTE — Progress Notes (Signed)
Cardiology Office Note   Date:  03/29/2021   ID:  Jeanne Pittman, DOB Jan 13, 1935, MRN [id number]  PCP:  [name], MD  Cardiologist:   Kathlyn Sacramento, MD   Chief Complaint  Patient presents with   Other    3 month f/u no complaints today. Meds reviewed verbally with pt.      History of Present Illness: Jeanne Pittman is a 86 y.o. female who presents for a followup visit regarding coronary artery disease and chronic systolic heart failure due to ischemic cardiomyopathy. She had anterior ST elevation myocardial infarction in December 2013 with late presentation complicated by cardiogenic shock. Emergent cardiac catheterization showed an occluded mid LAD and 95% stenosis in the proximal RCA. 2 drug-eluting stents were placed in the mid LAD and 1 drug-eluting stent to the proximal RCA. Ejection fraction was 25-30% with akinesis of the mid distal anterior, apical and distal inferior wall.  She has known history of non-Hodgkin's lymphoma. She has known history of myalgia with atorvastatin but she has been tolerating rosuvastatin. She was hospitalized in May 2021 with diverticulitis and improved with treatment.  She did have A. fib with RVR on presentation but converted to sinus rhythm.    Most recent echocardiogram in March 2022 showed an EF of 35 to 40% with mild mitral regurgitation and moderate tricuspid regurgitation.  Outpatient ZIO monitor showed sinus rhythm with short runs of SVT.  Average heart rate was 73 bpm.  No evidence of atrial fibrillation.  T She has been dealing with shingles since February.  In addition, she was found to have some enlarging pulmonary nodules recently thought to be due to and known history of lymphoma.  Treatment with rituximab is being considered.  The patient has been doing reasonably well from a cardiac standpoint with no chest pain or shortness of breath.  He continues to complain of generalized fatigue and poor appetite.  Her weight is now down  to 97 pounds.   Past Medical History:  Diagnosis Date   A-fib ([place])    07/2012: A. fib with RVR in the setting of myocardial infarction. Converted to sinus rhythm with amiodarone. No recurrence.   Anxiety    Atrophic vaginitis    Chronic combined systolic (congestive) and diastolic (congestive) heart failure ([place])    a. EF was 25-35% post MI but improved to 45-50% in 04/2013; b. 03/2017 Echo: EF 40-45%, mod focal/basal hypertrophy of septum. Sev mid-apicalanteroseptal, ant, and apical HK. Gr1 DD, mild AI/MR, mod TR, PASP 33mg.   Colon adenomas    Coronary artery disease    a. 199991111STEMI complicated by cardiogenic shock. Cath/PCI: LAD 1081mDESx2), RCA 95p(DES). EF 25%; b. 03/2017 MV: EF 57%, fixed apical, periapical, mid to distal anteroseptal defect. No ischemia.   Eczema    Fibrocystic breast disease    Gastritis    GERD (gastroesophageal reflux disease)    Glaucoma    Headache    Hyperlipidemia    Hypertension    Hypothyroidism    Insomnia    Ischemic cardiomyopathy    a. 07/2012 EF 25-35% following MI-->Improved to 45-50%;  b. 03/2017 Echo: EF 40-45%.   [facility] [place]   [place]   reoccurance 2007 and 2012   Osteoarthritis    Osteoporosis    Polyneuropathy    Polyposis of colon    Restless leg syndrome    Shingles    right   Urinary, incontinence, stress female  Past Surgical History:  Procedure Laterality Date   ABDOMINAL HYSTERECTOMY  1956   APPENDECTOMY     AXILLARY LYMPH NODE BIOPSY Left 03/18/2015   Procedure: AXILLARY LYMPH NODE BIOPSY;  Surgeon: [name], MD;  Location: [name];  Service: General;  Laterality: Left;   CARDIAC CATHETERIZATION  08/19/2012   [name]; [facility]   COLONOSCOPY     COLONOSCOPY WITH PROPOFOL N/A 03/02/2016   Procedure: COLONOSCOPY WITH PROPOFOL;  Surgeon: Manya Silvas, MD;  Location[facility] ENDOSCOPY;  Service: Endoscopy;  Laterality: N/A;   COLONOSCOPY WITH PROPOFOL N/A  09/26/2017   Procedure: COLONOSCOPY WITH PROPOFOL;  Surgeon: Manya Silvas, MD;  Location: [facility] ENDOSCOPY;  Service: Endoscopy;  Laterality: N/A;   COLONOSCOPY WITH PROPOFOL N/A 03/11/2020   Procedure: COLONOSCOPY WITH PROPOFOL;  Surgeon: Benjamine Sprague, DO;  Location: [name] ENDOSCOPY;  Service: General;  Laterality: N/A;   CORONARY ANGIOPLASTY WITH STENT PLACEMENT     x3 stents   CORONARY ANGIOPLASTY WITH STENT PLACEMENT     CORONARY ARTERY BYPASS GRAFT     ESOPHAGOGASTRODUODENOSCOPY (EGD) WITH PROPOFOL N/A 03/02/2016   Procedure: ESOPHAGOGASTRODUODENOSCOPY (EGD) WITH PROPOFOL;  Surgeon: Manya Silvas, MD;  Location: [facility] ENDOSCOPY;  Service: Endoscopy;  Laterality: N/A;   ESOPHAGOGASTRODUODENOSCOPY (EGD) WITH PROPOFOL N/A 04/14/2019   Procedure: ESOPHAGOGASTRODUODENOSCOPY (EGD) WITH PROPOFOL;  Surgeon: Toledo, Benay Pike, MD;  Location: [name] ENDOSCOPY;  Service: Gastroenterology;  Laterality: N/A;   ESOPHAGOGASTRODUODENOSCOPY (EGD) WITH PROPOFOL N/A 12/07/2020   Procedure: ESOPHAGOGASTRODUODENOSCOPY (EGD) WITH PROPOFOL;  Surgeon: Lesly Rubenstein, MD;  Location: [name] ENDOSCOPY;  Service: Endoscopy;  Laterality: N/A;   LYMPH NODE BIOPSY Right 07-24-11   Dr Bary Castilla   MOHS SURGERY     bilateral shoulders   PTCA     skin cancer removal     TOTAL ABDOMINAL HYSTERECTOMY W/ BILATERAL SALPINGOOPHORECTOMY       Current Outpatient Medications  Medication Sig Dispense Refill   acetaminophen (TYLENOL) 650 MG CR tablet Take 650 mg by mouth every 8 (eight) hours as needed for pain.     aspirin 81 MG tablet Take 81 mg by mouth every morning.      carvedilol (COREG) 6.25 MG tablet Take 1 tablet (6.25 mg total) by mouth 2 (two) times daily with a meal. 180 tablet 1   Cholecalciferol (VITAMIN D-3) 5000 UNITS TABS Take 2,000 Int'l Units by mouth every morning.      DULoxetine (CYMBALTA) 20 MG capsule Take 20 mg by mouth daily.     fexofenadine (ALLEGRA) 180 MG tablet Take 180 mg by mouth daily as needed  for rhinitis.      ibuprofen (ADVIL) 800 MG tablet Take 800 mg by mouth every 6 (six) hours as needed.     latanoprost (XALATAN) 0.005 % ophthalmic solution Place 1 drop into both eyes at bedtime.     LORazepam (ATIVAN) 0.5 MG tablet Take 0.5 mg by mouth every 8 (eight) hours as needed for anxiety.      losartan (COZAAR) 25 MG tablet TAKE 1/2 TABLET(12.5 MG) BY MOUTH DAILY AFTER BREAKFAST 45 tablet 3   pantoprazole (PROTONIX) 40 MG tablet TAKE 1 TABLET(40 MG) BY MOUTH TWICE DAILY 180 tablet 1   rosuvastatin (CRESTOR) 10 MG tablet TAKE 1 TABLET(10 MG) BY MOUTH EVERY OTHER DAY 45 tablet 0   timolol (TIMOPTIC) 0.5 % ophthalmic solution Place 1 drop into both eyes 2 (two) times daily.     No current facility-administered medications for this visit.    Allergies:  Ace inhibitors, Benadryl [diphenhydramine hcl], Codeine, Diphenhydramine, Guaiacol, Hydrocodone, Cardizem [diltiazem], and Guaifenesin & derivatives    Social History:  The patient  reports that she quit smoking about 72 years ago. Her smoking use included cigarettes. She has never used smokeless tobacco. She reports that she does not drink alcohol and does not use drugs.   Family History:  The patient's family history includes Heart attack in her father; Heart disease in her brother; Leukemia in her mother.    ROS:  Please see the history of present illness.   Otherwise, review of systems are positive for none.   All other systems are reviewed and negative.    PHYSICAL EXAM: VS:  BP (!) 128/58 (BP Location: Left Arm, Patient Position: Sitting, Cuff Size: Normal)   Pulse 64   Ht '5\' 1"'$  (1.549 m)   Wt 97 lb 4 oz (44.1 kg)   SpO2 98%   BMI 18.38 kg/m  , BMI Body mass index is 18.38 kg/m. GEN: Well nourished, well developed, in no acute distress  HEENT: normal  Neck: no JVD, carotid bruits, or masses Cardiac: RRR; no murmurs, rubs, or gallops,no edema  Respiratory:  clear to auscultation bilaterally, normal work of  breathing GI: soft, nontender, nondistended, + BS MS: no deformity or atrophy  Skin: warm and dry, no rash Neuro:  Strength and sensation are intact Psych: euthymic mood, full affect   EKG:  EKG is ordered today. The ekg ordered today demonstrates normal sinus rhythm with old anterior infarct.  Left axis deviation.  Recent Labs: 03/25/2021: ALT 14; BUN 26; Creatinine, Ser 1.00; Hemoglobin 11.9; Platelets 135; Potassium 4.1; Sodium 136    Lipid Panel    Component Value Date/Time   CHOL 172 09/02/2015 0824   TRIG 111 09/02/2015 0824   HDL 51 09/02/2015 0824   CHOLHDL 3.4 09/02/2015 0824   LDLCALC 99 09/02/2015 0824      Wt Readings from Last 3 Encounters:  03/29/21 97 lb 4 oz (44.1 kg)  03/25/21 97 lb 6.4 oz (44.2 kg)  03/24/21 95 lb (43.1 kg)        ASSESSMENT AND PLAN:  1.  Coronary artery disease involving native coronary arteries without angina: She is doing reasonably well from a cardiac standpoint with no anginal symptoms.  Continue medical therapy.  2. Hyperlipidemia: Continue treatment with rosuvastatin.  LDL has been below 70.  I reviewed most recent lipid profile done 2 weeks ago which showed an LDL of 52.  3.  Chronic systolic heart failure due to ischemic cardiomyopathy: She continues to be euvolemic even after stopping furosemide.   Continue carvedilol and losartan.  4.  Paroxysmal atrial fibrillation: Brief episodes of A. fib in the setting of acute illness.  Recent ZIO monitor showed no evidence of atrial fibrillation.  No need for anticoagulation.  The dose of carvedilol was recently increased.   Disposition:   FU with me in 6 months  Signed,  Kathlyn Sacramento, MD  03/29/2021 2:05 PM    [place]

## 2021-03-29 NOTE — Patient Instructions (Signed)

## 2021-03-31 ENCOUNTER — Telehealth: Payer: Self-pay | Admitting: *Deleted

## 2021-03-31 NOTE — Telephone Encounter (Signed)
Dr. B aware and he will reach out to the patient's son.

## 2021-03-31 NOTE — Telephone Encounter (Signed)
Patient's son requesting a brief telephone call with Dr. Burlene Arnt to discuss treatment options for patient.

## 2021-04-01 ENCOUNTER — Telehealth: Payer: Self-pay | Admitting: Internal Medicine

## 2021-04-01 NOTE — Telephone Encounter (Signed)
On 8/12-I tried to reach patient's son Marya Amsler to discuss patient's treatment plan; unable to leave a voicemail.   H/T-please reach out to the son later in the week.  GB

## 2021-04-06 ENCOUNTER — Telehealth: Payer: Self-pay | Admitting: Internal Medicine

## 2021-04-06 ENCOUNTER — Encounter: Payer: Self-pay | Admitting: *Deleted

## 2021-04-06 NOTE — Telephone Encounter (Signed)
On 8/17-spoke to patient's son Marya Amsler; as per Marya Amsler patient is ready to start Rituxan.  Discussed the schedule weekly x4.  And repeat scan after 3 months also.  Son will call us regarding timing of the starting of Rituxan  GB

## 2021-04-06 NOTE — Telephone Encounter (Signed)
Pt son return call and is requesting for Dr. B to call him. He missed the call due to [place] to be [place] of [place] weekend.

## 2021-04-06 NOTE — Telephone Encounter (Signed)
Mychart msg sent to patient to have son call Dr. Jacinto Reap

## 2021-04-07 ENCOUNTER — Ambulatory Visit
Admission: EM | Admit: 2021-04-07 | Discharge: 2021-04-07 | Disposition: A | Discharge status: Home / Self Care | Payer: Medicare Other | Attending: Emergency Medicine | Admitting: Emergency Medicine

## 2021-04-07 ENCOUNTER — Other Ambulatory Visit: Payer: Self-pay

## 2021-04-07 ENCOUNTER — Encounter: Payer: Self-pay | Admitting: Emergency Medicine

## 2021-04-07 DIAGNOSIS — S81812A Laceration without foreign body, left lower leg, initial encounter: Secondary | ICD-10-CM

## 2021-04-07 NOTE — ED Triage Notes (Signed)
Pt here with skin tear from dog leash wrapped around left lower leg. Slight bleeding.

## 2021-04-07 NOTE — Discharge Instructions (Addendum)
See the attached information on taking care of the laceration closed with skin adhesive.    Please talk to your primary care provider about the need for a tetanus booster.    For the areas not closed with skin adhesive:  Keep your wound clean and dry.  Wash it gently twice a day with soap and water.  Apply an antibiotic cream twice a day.    Go to the Emergency Department or return here if you see signs of infection, such as redness, pus-like drainage, warmth, fever, or other concerning symptoms.    Follow-up with your primary care provider for wound recheck in 2 to 3 days.

## 2021-04-07 NOTE — ED Provider Notes (Signed)
[name]    CSN: [id number] Arrival date & time: 04/07/21  1851      History   Chief Complaint Chief Complaint  Patient presents with   Skin Tear     HPI Jeanne Pittman is a 86 y.o. female.  Accompanied by her son, patient presents with a skin tear on her left lower leg at her ankle which occurred when her dog's leash wrapped around her ankle this afternoon.  Bleeding controlled with direct pressure.  She did not fall or hit her head.  She denies other injuries.  She denies numbness, weakness, paresthesias, or other symptoms.  Her medical history includes atrial fibrillation, heart failure, hypertension.  Patient is unsure of her last tetanus but states she is not able to get immunizations right now because she has active shingles since February 2022.  The history is provided by the patient, a relative and medical records.   Past Medical History:  Diagnosis Date   A-fib ([place])    07/2012: A. fib with RVR in the setting of myocardial infarction. Converted to sinus rhythm with amiodarone. No recurrence.   Anxiety    Atrophic vaginitis    Chronic combined systolic (congestive) and diastolic (congestive) heart failure ([place])    a. EF was 25-35% post MI but improved to 45-50% in 04/2013; b. 03/2017 Echo: EF 40-45%, mod focal/basal hypertrophy of septum. Sev mid-apicalanteroseptal, ant, and apical HK. Gr1 DD, mild AI/MR, mod TR, PASP 40mg.   Colon adenomas    Coronary artery disease    a. [id number] complicated by cardiogenic shock. Cath/PCI: LAD 106mDESx2), RCA 95p(DES). EF 25%; b. 03/2017 MV: EF 57%, fixed apical, periapical, mid to distal anteroseptal defect. No ischemia.   Eczema    Fibrocystic breast disease    Gastritis    GERD (gastroesophageal reflux disease)    Glaucoma    Headache    Hyperlipidemia    Hypertension    Hypothyroidism    Insomnia    Ischemic cardiomyopathy    a. 07/2012 EF 25-35% following MI-->Improved to 45-50%;  b. 03/2017 Echo: EF  40-45%.   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05/07/2014   HLD (hyperlipidemia) 12/30/2013   Preoperative cardiovascular examination 04/07/2013   Hyperlipidemia 04/07/2013   A-fib ([facility]   Coronary artery disease    Chronic systolic heart failure ([facility]    Past Surgical History:  Procedure Laterality Date   ABDOMINAL HYSTERECTOMY  1956   APPENDECTOMY     AXILLARY LYMPH NODE BIOPSY Left 03/18/2015   Procedure: AXILLARY LYMPH NODE BIOPSY;  Surgeon: JeRobert BellowMD;  Location: [name];  Service: General;  Laterality: Left;   CARDIAC CATHETERIZATION  08/19/2012   [name]; [facility]   COLONOSCOPY     COLONOSCOPY WITH PROPOFOL  N/A 03/02/2016   Procedure: COLONOSCOPY WITH PROPOFOL;  Surgeon: Manya Silvas, MD;  Location[facility]COPY;  Service: Endoscopy;  Laterality: N/A;   COLONOSCOPY WITH PROPOFOL N/A 09/26/2017   Procedure: COLONOSCOPY WITH PROPOFOL;  Surgeon: Manya Silvas, MD;  Location[facility] ENDOSCOPY;  Service: Endoscopy;  Laterality: N/A;   COLONOSCOPY WITH PROPOFOL N/A 03/11/2020   Procedure: COLONOSCOPY WITH PROPOFOL;  Surgeon: Benjamine Sprague, DO;  Location: [name] ENDOSCOPY;  Service: General;  Laterality: N/A;   CORONARY ANGIOPLASTY WITH STENT PLACEMENT     x3 stents   CORONARY ANGIOPLASTY WITH STENT PLACEMENT      CORONARY ARTERY BYPASS GRAFT     ESOPHAGOGASTRODUODENOSCOPY (EGD) WITH PROPOFOL N/A 03/02/2016   Procedure: ESOPHAGOGASTRODUODENOSCOPY (EGD) WITH PROPOFOL;  Surgeon: Manya Silvas, MD;  Location: [facility] ENDOSCOPY;  Service: Endoscopy;  Laterality: N/A;   ESOPHAGOGASTRODUODENOSCOPY (EGD) WITH PROPOFOL N/A 04/14/2019   Procedure: ESOPHAGOGASTRODUODENOSCOPY (EGD) WITH PROPOFOL;  Surgeon: Toledo, Benay Pike, MD;  Location: [name] ENDOSCOPY;  Service: Gastroenterology;  Laterality: N/A;   ESOPHAGOGASTRODUODENOSCOPY (EGD) WITH PROPOFOL N/A 12/07/2020   Procedure: ESOPHAGOGASTRODUODENOSCOPY (EGD) WITH PROPOFOL;  Surgeon: Lesly Rubenstein, MD;  Location: [name] ENDOSCOPY;  Service: Endoscopy;  Laterality: N/A;   LYMPH NODE BIOPSY Right 07-24-11   Dr Bary Castilla   MOHS SURGERY     bilateral shoulders   PTCA     skin cancer removal     TOTAL ABDOMINAL HYSTERECTOMY W/ BILATERAL SALPINGOOPHORECTOMY      OB History   No obstetric history on file.      Home Medications    Prior to Admission medications   Medication Sig Start Date End Date Taking? Authorizing Provider  acetaminophen (TYLENOL) 650 MG CR tablet Take 650 mg by mouth every 8 (eight) hours as needed for pain.    [provider]  aspirin 81 MG tablet Take 81 mg by mouth every morning.     [provider]  carvedilol (COREG) 6.25 MG tablet Take 1 tablet (6.25 mg total) by mouth 2 (two) times daily with a meal. 11/11/20   Wellington Hampshire, MD  Cholecalciferol (VITAMIN D-3) 5000 UNITS TABS Take 2,000 Int'l Units by mouth every morning.     [provider]  DULoxetine (CYMBALTA) 20 MG capsule Take 20 mg by mouth daily. 11/30/20   [provider]  fexofenadine (ALLEGRA) 180 MG tablet Take 180 mg by mouth daily as needed for rhinitis.     [provider]  ibuprofen (ADVIL) 800 MG tablet Take 800 mg by mouth every 6 (six) hours as needed. 10/04/20   [provider]  latanoprost (XALATAN) 0.005 %  ophthalmic solution Place 1 drop into both eyes at bedtime. 07/15/14   [provider]  LORazepam (ATIVAN) 0.5 MG tablet Take 0.5 mg by mouth every 8 (eight) hours as needed for anxiety.     [provider]  losartan (COZAAR) 25 MG tablet TAKE 1/2 TABLET(12.5 MG) BY MOUTH DAILY AFTER BREAKFAST 11/19/20   Wellington Hampshire, MD  pantoprazole (PROTONIX) 40 MG tablet TAKE 1 TABLET(40 MG) BY MOUTH TWICE DAILY 11/19/20   Wellington Hampshire, MD  rosuvastatin (CRESTOR) 10 MG tablet TAKE 1 TABLET(10 MG) BY MOUTH EVERY OTHER DAY 03/21/21   Wellington Hampshire, MD  timolol (TIMOPTIC) 0.5 % ophthalmic solution Place 1 drop into both eyes 2 (two) times daily. 06/15/14   [provider]    Family History Family History  Problem Relation Age of Onset   Heart attack Father  Heart disease Brother    Leukemia Mother    Bladder Cancer Neg Hx    Prostate cancer Neg Hx    Kidney cancer Neg Hx    Breast cancer Neg Hx     Social History Social History   Tobacco Use   Smoking status: Former    Types: Cigarettes    Quit date: 1950    Years since quitting: 72.6   Smokeless tobacco: Never  Vaping Use   Vaping Use: Never used  Substance Use Topics   Alcohol use: No    Alcohol/week: 0.0 standard drinks   Drug use: No     Allergies   Ace inhibitors, Benadryl [diphenhydramine hcl], Codeine, Diphenhydramine, Guaiacol, Hydrocodone, Cardizem [diltiazem], and Guaifenesin & derivatives   Review of Systems Review of Systems  Constitutional:  Negative for chills and fever.  Respiratory:  Negative for cough and shortness of breath.   Cardiovascular:  Negative for chest pain and palpitations.  Skin:  Positive for color change and wound.  Neurological:  Negative for weakness and numbness.  All other systems reviewed and are negative.   Physical Exam Triage Vital Signs ED Triage Vitals  Enc Vitals Group     BP      Pulse      Resp      Temp      Temp src      SpO2      Weight       Height      Head Circumference      Peak Flow      Pain Score      Pain Loc      Pain Edu?      Excl. in [place]?    No data found.  Updated Vital Signs BP 134/75   Pulse 74   Temp 98.1 F (36.7 C)   Resp 20   SpO2 97%   Visual Acuity Right Eye Distance:   Left Eye Distance:   Bilateral Distance:    Right Eye Near:   Left Eye Near:    Bilateral Near:     Physical Exam Vitals and nursing note reviewed.  Constitutional:      General: She is not in acute distress.    Appearance: She is well-developed.  HENT:     Head: Normocephalic and atraumatic.     Mouth/Throat:     Mouth: Mucous membranes are moist.  Eyes:     Conjunctiva/sclera: Conjunctivae normal.  Cardiovascular:     Rate and Rhythm: Normal rate and regular rhythm.     Heart sounds: Normal heart sounds.  Pulmonary:     Effort: Pulmonary effort is normal. No respiratory distress.     Breath sounds: Normal breath sounds.  Abdominal:     Palpations: Abdomen is soft.     Tenderness: There is no abdominal tenderness.  Musculoskeletal:        General: Swelling and tenderness present. Normal range of motion.     Cervical back: Neck supple.  Skin:    General: Skin is warm and dry.     Capillary Refill: Capillary refill takes less than 2 seconds.     Findings: Bruising and lesion present. No erythema.     Comments: Large v-shaped skin tear on right lower leg at medial and posterior ankle.  No active bleeding.  Neurological:     General: No focal deficit present.     Mental Status: She is alert and oriented to person, place, and time.  Sensory: No sensory deficit.     Motor: No weakness.     Gait: Gait normal.  Psychiatric:        Mood and Affect: Mood normal.        Behavior: Behavior normal.     UC Treatments / Results  Labs (all labs ordered are listed, but only abnormal results are displayed) Labs Reviewed - No data to display  EKG   Radiology No results found.  Procedures Laceration  Repair  Date/Time: 04/07/2021 7:33 PM Performed by: Sharion Balloon, NP Authorized by: Sharion Balloon, NP   Consent:    Consent obtained:  Verbal   Consent given by:  Patient   Risks discussed:  Infection and pain Universal protocol:    Procedure explained and questions answered to patient or proxy's satisfaction: yes   Anesthesia:    Anesthesia method:  None Laceration details:    Location:  Leg   Leg location:  L lower leg   Length (cm):  6   Depth (mm):  1 Pre-procedure details:    Preparation:  Patient was prepped and draped in usual sterile fashion Exploration:    Hemostasis achieved with:  Direct pressure   Imaging outcome: foreign body not noted     Wound exploration: wound explored through full range of motion and entire depth of wound visualized   Treatment:    Area cleansed with:  Shur-Clens   Amount of cleaning:  Standard   Irrigation solution:  Sterile water   Irrigation method:  Syringe   Visualized foreign bodies/material removed: no   Skin repair:    Repair method:  Tissue adhesive Repair type:    Repair type:  Simple Post-procedure details:    Dressing:  Non-adherent dressing   Procedure completion:  Tolerated well, no immediate complications (including critical care time)  Medications Ordered in UC Medications - No data to display  Initial Impression / Assessment and Plan / UC Course  I have reviewed the triage vital signs and the nursing notes.  Pertinent labs & imaging results that were available during my care of the patient were reviewed by me and considered in my medical decision making (see chart for details).  Left lower leg skin tear.  Closed with Dermabond.  Education provided on skin lacerations closed with tissue adhesive.  Also discussed wound care and signs of infection.  Instructed patient to go to the emergency department or return here if she notes signs of infection.  Instructed her to schedule follow-up with her PCP for a wound recheck in  2 to 3 days.  Patient declines tetanus booster today; she states she is unable to have immunizations right now because she has shingles; Instructed patient to discuss this with her PCP tomorrow.  Patient and her son agree to plan of care.   Final Clinical Impressions(s) / UC Diagnoses   Final diagnoses:  Skin tear of left lower leg without complication, initial encounter     Discharge Instructions      See the attached information on taking care of the laceration closed with skin adhesive.    Please talk to your primary care provider about the need for a tetanus booster.    For the areas not closed with skin adhesive:  Keep your wound clean and dry.  Wash it gently twice a day with soap and water.  Apply an antibiotic cream twice a day.    Go to the Emergency Department or return here if you see signs of  infection, such as redness, pus-like drainage, warmth, fever, or other concerning symptoms.    Follow-up with your primary care provider for wound recheck in 2 to 3 days.         ED Prescriptions   None    PDMP not reviewed this encounter.   Sharion Balloon, NP 04/07/21 1939

## 2021-04-07 NOTE — ED Notes (Signed)
Wound care performed to left lower skin tear. Antibiotic ointment, telfa, coban

## 2021-04-07 NOTE — ED Notes (Signed)
Pt refuses Tdap

## 2021-04-11 ENCOUNTER — Telehealth: Payer: Self-pay

## 2021-04-11 ENCOUNTER — Inpatient Hospital Stay: Payer: Medicare Other

## 2021-04-11 NOTE — Telephone Encounter (Addendum)
Nutrition  Called patient this am to switch patient to virtual visit.  Patient said that she had sent a message to cancel this appointment.  She will call back at a later date to reschedule.    Jeanne Pittman B. Jeanne Pittman, [name], [name] Registered Dietitian [phone] (mobile)

## 2021-04-26 ENCOUNTER — Other Ambulatory Visit: Payer: Self-pay | Admitting: Internal Medicine

## 2021-04-26 ENCOUNTER — Telehealth: Payer: Self-pay | Admitting: Internal Medicine

## 2021-04-26 DIAGNOSIS — C8304 Small cell B-cell lymphoma, lymph nodes of axilla and upper limb: Secondary | ICD-10-CM

## 2021-04-26 DIAGNOSIS — C8308 Small cell B-cell lymphoma, lymph nodes of multiple sites: Secondary | ICD-10-CM

## 2021-04-26 MED ORDER — ACYCLOVIR 400 MG PO TABS: 400.0000 mg | ORAL_TABLET | Freq: Two times a day (BID) | ORAL | 3 refills | Status: DC

## 2021-04-26 NOTE — Telephone Encounter (Signed)
Do you want me to add the Eveshield just in case we need it so I can hold a chair

## 2021-04-26 NOTE — Telephone Encounter (Signed)
Pt son called to schedule his mother starting her Rituxian treatment. Pt stated he was informed by Dr. B to call clinic when she was ready to start. Per the son he would like her to start on October 9th. Please call son with appt details.

## 2021-04-26 NOTE — Progress Notes (Signed)
START ON PATHWAY REGIMEN - Lymphoma and CLL     Ramp-up cycle = 35 days:     Venetoclax      Venetoclax      Venetoclax      Venetoclax      Venetoclax    Cycle 1 through 24 = every 28 days:     Venetoclax      Rituximab-xxxx      Rituximab-xxxx   **Always confirm dose/schedule in your pharmacy ordering system**  Patient Characteristics: Small Lymphocytic Lymphoma (SLL), Treatment Indicated, Second Line Disease Type: Small Lymphocytic Lymphoma (SLL) Disease Type: Not Applicable Disease Type: Not Applicable Treatment Indicated<= Treatment Indicated Line of Therapy: Second Line Intent of Therapy: Non-Curative / Palliative Intent, Discussed with Patient

## 2021-04-26 NOTE — Telephone Encounter (Signed)
Per md- to add for MD; labs- cbc/cmp; LDH; HBsAg; and HCV antibody; and HB core Antibody

## 2021-04-26 NOTE — Telephone Encounter (Signed)
Pt will also need Evushield. Dr. B to call pt's son to further discuss Evushield.

## 2021-04-26 NOTE — Telephone Encounter (Signed)
Spoke to patient's son Jeanne Pittman regarding the upcoming treatment plan.  He is interested in evusheld.  He is interested in more information. Lauren-please reach out to the patient's son Jeanne Pittman.  Called in prescription for shingles prevention.  Awaiting call regarding appointment on [phone]

## 2021-04-26 NOTE — Telephone Encounter (Signed)
Per Dr. Kavin Leech to schedule patient for lab/md/Rituxan (patient last had rituxan in 2012) on 10/10

## 2021-04-26 NOTE — Addendum Note (Signed)
Addended by: Gloris Ham on: 04/26/2021 01:42 PM   Modules accepted: Orders

## 2021-04-28 ENCOUNTER — Inpatient Hospital Stay: Payer: Medicare Other | Attending: Internal Medicine | Admitting: Nurse Practitioner

## 2021-04-28 DIAGNOSIS — Z2809 Immunization not carried out because of other contraindication: Secondary | ICD-10-CM | POA: Insufficient documentation

## 2021-04-28 DIAGNOSIS — D849 Immunodeficiency, unspecified: Secondary | ICD-10-CM

## 2021-04-28 DIAGNOSIS — Z298 Encounter for other specified prophylactic measures: Secondary | ICD-10-CM | POA: Insufficient documentation

## 2021-04-28 DIAGNOSIS — Z8572 Personal history of non-Hodgkin lymphomas: Secondary | ICD-10-CM | POA: Insufficient documentation

## 2021-04-28 NOTE — Progress Notes (Signed)
[place] at [facility]  Virtual Visit Progress Note for Evusheld   I connected with Jeanne Pittman on 04/28/21 at  3:30 PM EDT by telephone visit and verified that I am speaking with the correct person using two identifiers.   I discussed the limitations, risks, security and privacy concerns of performing an evaluation and management service by telemedicine and the availability of in-person appointments. I also discussed with the patient that there may be a patient responsible charge related to this service. The patient expressed understanding and agreed to proceed.   Other persons participating in the visit and their role in the encounter: patient's son, patient, myself  Patient's location: home  Provider's location: clinic  Chief Complaint: Evusheld     Patient Care Team: [name], MD as PCP - General (Unknown Physician Specialty) Wellington [place], MD as PCP - Cardiology (Cardiology) Byrnett, Forest Gleason, MD (General Surgery) Forest Gleason, MD (Inactive) (Oncology) Wellington [place], MD as Consulting Physician (Cardiology) Cammie Sickle, MD as Consulting Physician (Hematology and Oncology)   Name of the patient: Jeanne Pittman  [id number]  04/28/1935   Date of visit: 04/28/21  History of Presenting Illness- Patient present for evaluation and consideration of Evusheld. Patient is considered moderately to severely immunocompromised based on active cancer treatment or active cancer, history of organ transplant, having received a stem cell transplant within the last 2 years and taking immunosuppression, or active treatment with immunosuppressing medications.   Review of systems- Review of Systems  Constitutional:  Positive for malaise/fatigue. Negative for chills, diaphoresis, fever and weight loss.  HENT:  Negative for congestion, ear discharge, ear pain, nosebleeds, sinus pain and sore throat.   Eyes:  Negative for pain and redness.  Respiratory:   Negative for cough, hemoptysis, sputum production, shortness of breath, wheezing and stridor.   Cardiovascular:  Negative for chest pain, palpitations and leg swelling.  Gastrointestinal:  Negative for abdominal pain, constipation, diarrhea, nausea and vomiting.  Musculoskeletal:  Negative for falls, joint pain and myalgias.  Skin:  Negative for rash.  Neurological:  Negative for dizziness, loss of consciousness, weakness and headaches.  Psychiatric/Behavioral:  Negative for depression. The patient is not nervous/anxious and does not have insomnia.   All other systems reviewed and are negative.   Allergies  Allergen Reactions   Ace Inhibitors     Other reaction(s): Cough   Benadryl [Diphenhydramine Hcl]     Other reaction(s): Unknown   Codeine     Other reaction(s): Hallucination   Diphenhydramine    Guaiacol    Hydrocodone     Other reaction(s): Hallucination   Cardizem [Diltiazem] Rash   Guaifenesin & Derivatives Rash    Past Medical History:  Diagnosis Date   A-fib ([place])    07/2012: A. fib with RVR in the setting of myocardial infarction. Converted to sinus rhythm with amiodarone. No recurrence.   Anxiety    Atrophic vaginitis    Chronic combined systolic (congestive) and diastolic (congestive) heart failure ([place])    a. EF was 25-35% post MI but improved to 45-50% in 04/2013; b. 03/2017 Echo: EF 40-45%, mod focal/basal hypertrophy of septum. Sev mid-apicalanteroseptal, ant, and apical HK. Gr1 DD, mild AI/MR, mod TR, PASP 65mg.   Colon adenomas    Coronary artery disease    a. [id number] complicated by cardiogenic shock. Cath/PCI: LAD 1064mDESx2), RCA 95p(DES). EF 25%; b. 03/2017 MV: EF 57%, fixed apical, periapical, mid to distal anteroseptal defect. No ischemia.   Eczema  Fibrocystic breast disease    Gastritis    GERD (gastroesophageal reflux disease)    Glaucoma    Headache    Hyperlipidemia    Hypertension    Hypothyroidism    Insomnia    Ischemic  cardiomyopathy    a. 07/2012 EF 25-35% following MI-->Improved to 45-50%;  b. 03/2017 Echo: EF 40-45%.   Myocardial infarction ([place]) 08/18/2014   Non Hodgkin's lymphoma ([place]) 2005   reoccurance 2007 and 2012   Osteoarthritis    Osteoporosis    Polyneuropathy    Polyposis of colon    Restless leg syndrome    Shingles    right   Urinary, incontinence, stress female     Past Surgical History:  Procedure Laterality Date   ABDOMINAL HYSTERECTOMY  1956   APPENDECTOMY     AXILLARY LYMPH NODE BIOPSY Left 03/18/2015   Procedure: AXILLARY LYMPH NODE BIOPSY;  Surgeon: [name], MD;  Location: [name];  Service: General;  Laterality: Left;   CARDIAC CATHETERIZATION  08/19/2012   [name]; [facility]   COLONOSCOPY     COLONOSCOPY WITH PROPOFOL N/A 03/02/2016   Procedure: COLONOSCOPY WITH PROPOFOL;  Surgeon: Manya Silvas, MD;  Location: [facility] ENDOSCOPY;  Service: Endoscopy;  Laterality: N/A;   COLONOSCOPY WITH PROPOFOL N/A 09/26/2017   Procedure: COLONOSCOPY WITH PROPOFOL;  Surgeon: Manya Silvas, MD;  Location: [facility] ENDOSCOPY;  Service: Endoscopy;  Laterality: N/A;   COLONOSCOPY WITH PROPOFOL N/A 03/11/2020   Procedure: COLONOSCOPY WITH PROPOFOL;  Surgeon: Benjamine Sprague, DO;  Location: [name] ENDOSCOPY;  Service: General;  Laterality: N/A;   CORONARY ANGIOPLASTY WITH STENT PLACEMENT     x3 stents   CORONARY ANGIOPLASTY WITH STENT PLACEMENT     CORONARY ARTERY BYPASS GRAFT     ESOPHAGOGASTRODUODENOSCOPY (EGD) WITH PROPOFOL N/A 03/02/2016   Procedure: ESOPHAGOGASTRODUODENOSCOPY (EGD) WITH PROPOFOL;  Surgeon: Manya Silvas, MD;  Location: [facility] ENDOSCOPY;  Service: Endoscopy;  Laterality: N/A;   ESOPHAGOGASTRODUODENOSCOPY (EGD) WITH PROPOFOL N/A 04/14/2019   Procedure: ESOPHAGOGASTRODUODENOSCOPY (EGD) WITH PROPOFOL;  Surgeon: Toledo, Benay Pike, MD;  Location: [name] ENDOSCOPY;  Service: Gastroenterology;  Laterality: N/A;   ESOPHAGOGASTRODUODENOSCOPY (EGD) WITH PROPOFOL N/A 12/07/2020   Procedure:  ESOPHAGOGASTRODUODENOSCOPY (EGD) WITH PROPOFOL;  Surgeon: Lesly Rubenstein, MD;  Location: [name] ENDOSCOPY;  Service: Endoscopy;  Laterality: N/A;   LYMPH NODE BIOPSY Right 07-24-11   Dr Bary Castilla   MOHS SURGERY     bilateral shoulders   PTCA     skin cancer removal     TOTAL ABDOMINAL HYSTERECTOMY W/ BILATERAL SALPINGOOPHORECTOMY      Social History   Socioeconomic History   Marital status: Divorced    Spouse name: Not on file   Number of children: Not on file   Years of education: Not on file   Highest education level: Not on file  Occupational History   Not on file  Tobacco Use   Smoking status: Former    Types: Cigarettes    Quit date: 1950    Years since quitting: 72.7   Smokeless tobacco: Never  Vaping Use   Vaping Use: Never used  Substance and Sexual Activity   Alcohol use: No    Alcohol/week: 0.0 standard drinks   Drug use: No   Sexual activity: Yes    Birth control/protection: Surgical  Other Topics Concern   Not on file  Social History Narrative   Not on file   Social Determinants of Health   Financial Resource Strain: Not on file  Food Insecurity:  Not on file  Transportation Needs: Not on file  Physical Activity: Not on file  Stress: Not on file  Social Connections: Not on file  Intimate Partner Violence: Not on file    Immunization History  Administered Date(s) Administered   Influenza Inj Mdck Quad Pf 06/10/2018   Influenza Split 07/05/2015   Influenza, High Dose Seasonal PF 05/25/2017   Influenza,inj,Quad PF,6+ Mos 06/26/2016, 05/10/2017, 05/24/2020   Influenza-Unspecified 05/22/2014   PFIZER(Purple Top)SARS-COV-2 Vaccination 08/28/2019, 09/18/2019, 04/12/2020    Family History  Problem Relation Age of Onset   Heart attack Father    Heart disease Brother    Leukemia Mother    Bladder Cancer Neg Hx    Prostate cancer Neg Hx    Kidney cancer Neg Hx    Breast cancer Neg Hx     Current Outpatient Medications:    acetaminophen (TYLENOL)  650 MG CR tablet, Take 650 mg by mouth every 8 (eight) hours as needed for pain., Disp: , Rfl:    acyclovir (ZOVIRAX) 400 MG tablet, Take 1 tablet (400 mg total) by mouth 2 (two) times daily. [to prevent shingles], Disp: 60 tablet, Rfl: 3   aspirin 81 MG tablet, Take 81 mg by mouth every morning. , Disp: , Rfl:    carvedilol (COREG) 6.25 MG tablet, Take 1 tablet (6.25 mg total) by mouth 2 (two) times daily with a meal., Disp: 180 tablet, Rfl: 1   Cholecalciferol (VITAMIN D-3) 5000 UNITS TABS, Take 2,000 Int'l Units by mouth every morning. , Disp: , Rfl:    DULoxetine (CYMBALTA) 20 MG capsule, Take 20 mg by mouth daily., Disp: , Rfl:    fexofenadine (ALLEGRA) 180 MG tablet, Take 180 mg by mouth daily as needed for rhinitis. , Disp: , Rfl:    ibuprofen (ADVIL) 800 MG tablet, Take 800 mg by mouth every 6 (six) hours as needed., Disp: , Rfl:    latanoprost (XALATAN) 0.005 % ophthalmic solution, Place 1 drop into both eyes at bedtime., Disp: , Rfl:    LORazepam (ATIVAN) 0.5 MG tablet, Take 0.5 mg by mouth every 8 (eight) hours as needed for anxiety. , Disp: , Rfl:    losartan (COZAAR) 25 MG tablet, TAKE 1/2 TABLET(12.5 MG) BY MOUTH DAILY AFTER BREAKFAST, Disp: 45 tablet, Rfl: 3   pantoprazole (PROTONIX) 40 MG tablet, TAKE 1 TABLET(40 MG) BY MOUTH TWICE DAILY, Disp: 180 tablet, Rfl: 1   rosuvastatin (CRESTOR) 10 MG tablet, TAKE 1 TABLET(10 MG) BY MOUTH EVERY OTHER DAY, Disp: 45 tablet, Rfl: 0   timolol (TIMOPTIC) 0.5 % ophthalmic solution, Place 1 drop into both eyes 2 (two) times daily., Disp: , Rfl:   Physical exam: Exam limited due to telemedicine Physical Exam Constitutional:      General: She is not in acute distress. Pulmonary:     Effort: No respiratory distress.  Neurological:     Mental Status: She is alert and oriented to person, place, and time.  Psychiatric:        Mood and Affect: Mood normal.        Behavior: Behavior normal.   Assessment and plan- Patient is a 86 y.o. female who  presents to consideration of Evusheld.   We discussed that patient was referred to receive Evusheld (Tixagevimab and Cilgavimab). The FDA has issued an emergency use authorization (EUA) for this injection. Evusheld is a pre-exposure prevention of COVID-19 infection in adults who are at high risk for severe disease. Today we reviewed patient's risk and eligibility for Evusheld.  We discussed that consent is voluntary and patient can refuse at any time. An FDA fact sheet will be given to patient. We reviewed potential risks, benefits, and alternatives today. Patient agrees to proceed. We reviewed that some risks and benefits are unknown. Advised not to receive a covid vaccine until 2 weeks after receiving Evusheld. Advised that patient should receive Evusheld every 6 months. Advised that receiving Evusheld is not a guarantee in the prevention of getting Covid-19 and that patient should still follow other infection prevention measures.   Will schedule patient in 2 weeks for injections per their request.   Visit Diagnosis 1. Immunocompromised ([place])    Beckey Rutter, DNP, AGNP-C [place] at [facility] [phone] (clinic)

## 2021-04-29 ENCOUNTER — Encounter: Payer: Self-pay | Admitting: Internal Medicine

## 2021-04-29 MED ORDER — CILGAVIMAB (PART OF EVUSHELD) INJECTION: 300.0000 mg | Freq: Once | INTRAMUSCULAR | Status: DC

## 2021-04-29 MED ORDER — DIPHENHYDRAMINE HCL 50 MG/ML IJ SOLN
50.0000 mg | Freq: Once | INTRAMUSCULAR | Status: DC | PRN
Start: 2021-04-29 — End: 2021-04-29

## 2021-04-29 MED ORDER — METHYLPREDNISOLONE SODIUM SUCC 125 MG IJ SOLR
125.0000 mg | Freq: Once | INTRAMUSCULAR | Status: DC | PRN
Start: 2021-04-29 — End: 2021-04-29

## 2021-04-29 MED ORDER — EPINEPHRINE 0.3 MG/0.3ML IJ SOAJ: 0.3000 mg | Freq: Once | INTRAMUSCULAR | Status: DC | PRN

## 2021-04-29 MED ORDER — ALBUTEROL SULFATE HFA 108 (90 BASE) MCG/ACT IN AERS: 2.0000 | INHALATION_SPRAY | Freq: Once | RESPIRATORY_TRACT | Status: DC | PRN

## 2021-04-29 MED ORDER — TIXAGEVIMAB (PART OF EVUSHELD) INJECTION: 300.0000 mg | Freq: Once | INTRAMUSCULAR | Status: DC

## 2021-05-16 ENCOUNTER — Inpatient Hospital Stay: Payer: Medicare Other

## 2021-05-16 DIAGNOSIS — Z8572 Personal history of non-Hodgkin lymphomas: Secondary | ICD-10-CM | POA: Diagnosis not present

## 2021-05-16 DIAGNOSIS — Z298 Encounter for other specified prophylactic measures: Secondary | ICD-10-CM | POA: Diagnosis not present

## 2021-05-16 DIAGNOSIS — D849 Immunodeficiency, unspecified: Secondary | ICD-10-CM

## 2021-05-16 DIAGNOSIS — Z2809 Immunization not carried out because of other contraindication: Secondary | ICD-10-CM | POA: Diagnosis not present

## 2021-05-16 MED ORDER — CILGAVIMAB (PART OF EVUSHELD) INJECTION
300.0000 mg | Freq: Once | INTRAMUSCULAR | Status: AC
  Administered 2021-05-16: 300 mg via INTRAMUSCULAR
  Filled 2021-05-16: qty 3

## 2021-05-16 MED ORDER — TIXAGEVIMAB (PART OF EVUSHELD) INJECTION
300.0000 mg | Freq: Once | INTRAMUSCULAR | Status: AC
  Administered 2021-05-16: 300 mg via INTRAMUSCULAR
  Filled 2021-05-16: qty 3

## 2021-05-16 NOTE — Patient Instructions (Signed)
Tixagevimab; Cilgavimab Solutions for Injection What is this medication? TIXAGEVIMAB; CILGAVIMAB (tix a jev i mab; sil gav i mab) reduces the risk of getting COVID-19 in persons with immune system problems who may not respond properly to the COVID-19 vaccine or persons who can't receive the COVID-19 vaccine. It belongs to a group of medications called monoclonal antibodies. It may decrease the risk of developing severe symptoms of COVID-19. It may also decrease the chance of going to the hospital. This medication is not approved by the FDA. The FDA has authorized emergency use of this medication during the COVID-19 pandemic. This medicine may be used for other purposes; ask your health care provider or pharmacist if you have questions. COMMON BRAND NAME(S): EVUSHELD What should I tell my care team before I take this medication? They need to know if you have any of these conditions: any allergies any serious illness bleeding disorder have received a COVID-19 vaccine heart disease an unusual or allergic reaction to tixagevimab, cilgavimab, other medications, foods, dyes, or preservatives pregnant or trying to get pregnant breast-feeding How should I use this medication? This medication is injected into a muscle. It is given by your care team in a hospital or clinic setting. Talk to your care team about the use of this medication in children. While it may be given to children as young as 12 years, precautions do apply. Overdosage: If you think you have taken too much of this medicine contact a poison control center or emergency room at once. NOTE: This medicine is only for you. Do not share this medicine with others. What if I miss a dose? Keep appointments for follow-up doses. It is important not to miss your dose. Call your care team if you are unable to keep an appointment. What may interact with this medication? COVID-19 vaccines This list may not describe all possible interactions. Give  your health care provider a list of all the medicines, herbs, non-prescription drugs, or dietary supplements you use. Also tell them if you smoke, drink alcohol, or use illegal drugs. Some items may interact with your medicine. What should I watch for while using this medication? Your condition will be monitored carefully while you are receiving this medication. Visit your care team for regular checks on your progress. Tell your care team if your symptoms do not start to get better or if they get worse. After receiving this medication, wait at least 14 days before getting the COVID-19 vaccine. What side effects may I notice from receiving this medication? Side effects that you should report to your care team as soon as possible: Allergic reactions-skin rash, itching, hives, swelling of the face, lips, tongue, or throat Heart attack-pain or tightness in the chest, shoulders, arms, or jaw, nausea, shortness of breath, cold or clammy skin, feeling faint or lightheaded Heart failure-shortness of breath, swelling of the ankles, feet, or hands, sudden weight gain, unusual weakness or fatigue Heart rhythm changes-fast or irregular heartbeat, dizziness, feeling faint or lightheaded, chest pain, trouble breathing Side effects that usually do not require medical attention (report these to your care team if they continue or are bothersome): Cough Fatigue Headache Pain, redness, or irritation at site where injected This list may not describe all possible side effects. Call your doctor for medical advice about side effects. You may report side effects to FDA at 1-800-FDA-1088. Where should I keep my medication? This medication is given in a hospital or clinic. It will not be stored at home. NOTE: This sheet is  a summary. It may not cover all possible information. If you have questions about this medicine, talk to your doctor, pharmacist, or health care provider.  2022 Elsevier/Gold Standard (2020-07-29  16:22:16)

## 2021-05-16 NOTE — Progress Notes (Signed)
1515: pt tolerated Evusheld injections well. No s/s of distress noted, injection sites WNL, no redness, swelling or pain noted. Pt and VS stable at discharge.

## 2021-05-23 NOTE — Progress Notes (Signed)
Pharmacist Chemotherapy Monitoring - Initial Assessment    Anticipated start date: 05/30/21   The following has been reviewed per standard work regarding the patient's treatment regimen: The patient's diagnosis, treatment plan and drug doses, and organ/hematologic function Lab orders and baseline tests specific to treatment regimen  The treatment plan start date, drug sequencing, and pre-medications Prior authorization status  Patient's documented medication list, including drug-drug interaction screen and prescriptions for anti-emetics and supportive care specific to the treatment regimen The drug concentrations, fluid compatibility, administration routes, and timing of the medications to be used The patient's access for treatment and lifetime cumulative dose history, if applicable  The patient's medication allergies and previous infusion related reactions, if applicable   Changes made to treatment plan:  treatment plan date  Follow up needed:  signing treatment plan   Adelina Mings, [place], 05/23/2021  9:11 AM

## 2021-05-29 ENCOUNTER — Other Ambulatory Visit: Payer: Self-pay | Admitting: Cardiovascular Disease

## 2021-05-30 ENCOUNTER — Other Ambulatory Visit: Payer: Self-pay

## 2021-05-30 ENCOUNTER — Inpatient Hospital Stay (HOSPITAL_BASED_OUTPATIENT_CLINIC_OR_DEPARTMENT_OTHER): Payer: Medicare Other | Admitting: Internal Medicine

## 2021-05-30 ENCOUNTER — Encounter: Payer: Self-pay | Admitting: Internal Medicine

## 2021-05-30 ENCOUNTER — Inpatient Hospital Stay: Payer: Medicare Other | Attending: Internal Medicine

## 2021-05-30 ENCOUNTER — Inpatient Hospital Stay: Payer: Medicare Other

## 2021-05-30 VITALS — BP 148/71 | HR 73 | Temp 97.3°F | Resp 22

## 2021-05-30 DIAGNOSIS — Z90722 Acquired absence of ovaries, bilateral: Secondary | ICD-10-CM | POA: Insufficient documentation

## 2021-05-30 DIAGNOSIS — Z8719 Personal history of other diseases of the digestive system: Secondary | ICD-10-CM | POA: Insufficient documentation

## 2021-05-30 DIAGNOSIS — Z885 Allergy status to narcotic agent status: Secondary | ICD-10-CM | POA: Insufficient documentation

## 2021-05-30 DIAGNOSIS — C8308 Small cell B-cell lymphoma, lymph nodes of multiple sites: Secondary | ICD-10-CM

## 2021-05-30 DIAGNOSIS — M549 Dorsalgia, unspecified: Secondary | ICD-10-CM | POA: Diagnosis not present

## 2021-05-30 DIAGNOSIS — D696 Thrombocytopenia, unspecified: Secondary | ICD-10-CM | POA: Insufficient documentation

## 2021-05-30 DIAGNOSIS — C8304 Small cell B-cell lymphoma, lymph nodes of axilla and upper limb: Secondary | ICD-10-CM

## 2021-05-30 DIAGNOSIS — M2550 Pain in unspecified joint: Secondary | ICD-10-CM | POA: Insufficient documentation

## 2021-05-30 DIAGNOSIS — I5042 Chronic combined systolic (congestive) and diastolic (congestive) heart failure: Secondary | ICD-10-CM | POA: Insufficient documentation

## 2021-05-30 DIAGNOSIS — I252 Old myocardial infarction: Secondary | ICD-10-CM | POA: Diagnosis not present

## 2021-05-30 DIAGNOSIS — Z87891 Personal history of nicotine dependence: Secondary | ICD-10-CM | POA: Diagnosis not present

## 2021-05-30 DIAGNOSIS — Z9049 Acquired absence of other specified parts of digestive tract: Secondary | ICD-10-CM | POA: Diagnosis not present

## 2021-05-30 DIAGNOSIS — I4891 Unspecified atrial fibrillation: Secondary | ICD-10-CM | POA: Diagnosis not present

## 2021-05-30 DIAGNOSIS — Z8249 Family history of ischemic heart disease and other diseases of the circulatory system: Secondary | ICD-10-CM | POA: Insufficient documentation

## 2021-05-30 DIAGNOSIS — R400 Somnolence: Secondary | ICD-10-CM | POA: Diagnosis not present

## 2021-05-30 DIAGNOSIS — I2510 Atherosclerotic heart disease of native coronary artery without angina pectoris: Secondary | ICD-10-CM | POA: Diagnosis not present

## 2021-05-30 DIAGNOSIS — Z888 Allergy status to other drugs, medicaments and biological substances status: Secondary | ICD-10-CM | POA: Diagnosis not present

## 2021-05-30 DIAGNOSIS — R630 Anorexia: Secondary | ICD-10-CM | POA: Diagnosis not present

## 2021-05-30 DIAGNOSIS — Z8572 Personal history of non-Hodgkin lymphomas: Secondary | ICD-10-CM | POA: Insufficient documentation

## 2021-05-30 DIAGNOSIS — Z86010 Personal history of colonic polyps: Secondary | ICD-10-CM | POA: Diagnosis not present

## 2021-05-30 DIAGNOSIS — Z681 Body mass index (BMI) 19 or less, adult: Secondary | ICD-10-CM | POA: Insufficient documentation

## 2021-05-30 DIAGNOSIS — Z79899 Other long term (current) drug therapy: Secondary | ICD-10-CM | POA: Diagnosis not present

## 2021-05-30 DIAGNOSIS — Z5112 Encounter for antineoplastic immunotherapy: Secondary | ICD-10-CM | POA: Insufficient documentation

## 2021-05-30 DIAGNOSIS — M1990 Unspecified osteoarthritis, unspecified site: Secondary | ICD-10-CM | POA: Diagnosis not present

## 2021-05-30 LAB — COMPREHENSIVE METABOLIC PANEL
ALT: 11 U/L (ref 0–44)
AST: 17 U/L (ref 15–41)
Albumin: 4 g/dL (ref 3.5–5.0)
Alkaline Phosphatase: 53 U/L (ref 38–126)
Anion gap: 8 (ref 5–15)
BUN: 19 mg/dL (ref 8–23)
CO2: 28 mmol/L (ref 22–32)
Calcium: 9.3 mg/dL (ref 8.9–10.3)
Chloride: 100 mmol/L (ref 98–111)
Creatinine, Ser: 0.96 mg/dL (ref 0.44–1.00)
GFR, Estimated: 58 mL/min — ABNORMAL LOW (ref >60)
Glucose, Bld: 114 mg/dL — ABNORMAL HIGH (ref 70–99)
Potassium: 3.6 mmol/L (ref 3.5–5.1)
Sodium: 136 mmol/L (ref 135–145)
Total Bilirubin: 0.7 mg/dL (ref 0.3–1.2)
Total Protein: 7.3 g/dL (ref 6.5–8.1)

## 2021-05-30 LAB — CBC WITH DIFFERENTIAL/PLATELET
Abs Immature Granulocytes: 0.02 10*3/uL (ref 0.00–0.07)
Basophils Absolute: 0.1 10*3/uL (ref 0.0–0.1)
Basophils Relative: 1 %
Eosinophils Absolute: 0.2 10*3/uL (ref 0.0–0.5)
Eosinophils Relative: 2 %
HCT: 34.3 % — ABNORMAL LOW (ref 36.0–46.0)
Hemoglobin: 11.7 g/dL — ABNORMAL LOW (ref 12.0–15.0)
Immature Granulocytes: 0 %
Lymphocytes Relative: 56 %
Lymphs Abs: 3.8 10*3/uL (ref 0.7–4.0)
MCH: 32.3 pg (ref 26.0–34.0)
MCHC: 34.1 g/dL (ref 30.0–36.0)
MCV: 94.8 fL (ref 80.0–100.0)
Monocytes Absolute: 0.4 10*3/uL (ref 0.1–1.0)
Monocytes Relative: 6 %
Neutro Abs: 2.4 10*3/uL (ref 1.7–7.7)
Neutrophils Relative %: 35 %
Platelets: 136 10*3/uL — ABNORMAL LOW (ref 150–400)
RBC: 3.62 MIL/uL — ABNORMAL LOW (ref 3.87–5.11)
RDW: 13.5 % (ref 11.5–15.5)
WBC: 6.8 10*3/uL (ref 4.0–10.5)
nRBC: 0 % (ref 0.0–0.2)

## 2021-05-30 LAB — HEPATITIS B CORE ANTIBODY, IGM: Hep B C IgM: NONREACTIVE

## 2021-05-30 LAB — LACTATE DEHYDROGENASE: LDH: 97 U/L — ABNORMAL LOW (ref 98–192)

## 2021-05-30 MED ORDER — ACETAMINOPHEN 325 MG PO TABS
650.0000 mg | ORAL_TABLET | Freq: Once | ORAL | Status: AC
  Administered 2021-05-30: 650 mg via ORAL
  Filled 2021-05-30: qty 2

## 2021-05-30 MED ORDER — SODIUM CHLORIDE 0.9 % IV SOLN
375.0000 mg/m2 | Freq: Once | INTRAVENOUS | Status: AC
  Administered 2021-05-30: 500 mg via INTRAVENOUS
  Filled 2021-05-30: qty 50

## 2021-05-30 MED ORDER — ACYCLOVIR 400 MG PO TABS: 400.0000 mg | ORAL_TABLET | Freq: Two times a day (BID) | ORAL | 3 refills | Status: DC

## 2021-05-30 MED ORDER — DIPHENHYDRAMINE HCL 25 MG PO CAPS
50.0000 mg | ORAL_CAPSULE | Freq: Once | ORAL | Status: AC
  Administered 2021-05-30: 50 mg via ORAL
  Filled 2021-05-30: qty 2

## 2021-05-30 MED ORDER — SODIUM CHLORIDE 0.9 % IV SOLN
Freq: Once | INTRAVENOUS | Status: AC
  Filled 2021-05-30: qty 250

## 2021-05-30 MED ORDER — SODIUM CHLORIDE 0.9 % IV SOLN
12.0000 mg | Freq: Once | INTRAVENOUS | Status: AC
  Administered 2021-05-30: 12 mg via INTRAVENOUS
  Filled 2021-05-30: qty 1.2

## 2021-05-30 NOTE — Patient Instructions (Signed)
[facility]  Discharge Instructions: Thank you for choosing [facility] to provide your oncology and hematology care.  If you have a lab appointment with the [facility], please go directly to the [facility] and check in at the registration area.  Wear comfortable clothing and clothing appropriate for easy access to any Portacath or PICC line.   We strive to give you quality time with your provider. You may need to reschedule your appointment if you arrive late (15 or more minutes).  Arriving late affects you and other patients whose appointments are after yours.  Also, if you miss three or more appointments without notifying the office, you may be dismissed from the clinic at the provider's discretion.      For prescription refill requests, have your pharmacy contact our office and allow 72 hours for refills to be completed.    Today you received the following chemotherapy and/or immunotherapy agents rituximab      To help prevent nausea and vomiting after your treatment, we encourage you to take your nausea medication as directed.  BELOW ARE SYMPTOMS THAT SHOULD BE REPORTED IMMEDIATELY: *FEVER GREATER THAN 100.4 F (38 C) OR HIGHER *CHILLS OR SWEATING *NAUSEA AND VOMITING THAT IS NOT CONTROLLED WITH YOUR NAUSEA MEDICATION *UNUSUAL SHORTNESS OF BREATH *UNUSUAL BRUISING OR BLEEDING *URINARY PROBLEMS (pain or burning when urinating, or frequent urination) *BOWEL PROBLEMS (unusual diarrhea, constipation, pain near the anus) TENDERNESS IN MOUTH AND THROAT WITH OR WITHOUT PRESENCE OF ULCERS (sore throat, sores in mouth, or a toothache) UNUSUAL RASH, SWELLING OR PAIN  UNUSUAL VAGINAL DISCHARGE OR ITCHING   Items with * indicate a potential emergency and should be followed up as soon as possible or go to the Emergency Department if any problems should occur.  Please show the CHEMOTHERAPY ALERT CARD or IMMUNOTHERAPY ALERT CARD at check-in  to the Emergency Department and triage nurse.  Should you have questions after your visit or need to cancel or reschedule your appointment, please contact [facility]  [phone] and follow the prompts.  Office hours are 8:00 a.m. to 4:30 p.m. Monday - Friday. Please note that voicemails left after 4:00 p.m. may not be returned until the following business day.  We are closed weekends and major holidays. You have access to a nurse at all times for urgent questions. Please call the main number to the clinic [phone] and follow the prompts.  For any non-urgent questions, you may also contact your provider using MyChart. We now offer e-Visits for anyone 10 and older to request care online for non-urgent symptoms. For details visit [email or web address].   Also download the MyChart app! Go to the app store, search "MyChart", open the app, select [facility], and log in with your MyChart username and password.  Due to Covid, a mask is required upon entering the hospital/clinic. If you do not have a mask, one will be given to you upon arrival. For doctor visits, patients may have 1 support person aged 84 or older with them. For treatment visits, patients cannot have anyone with them due to current Covid guidelines and our immunocompromised population.   Rituximab Injection What is this medication? RITUXIMAB (ri TUX i mab) is a monoclonal antibody. It is used to treat certain types of cancer like non-Hodgkin lymphoma and chronic lymphocytic leukemia. It is also used to treat rheumatoid arthritis, granulomatosis with polyangiitis, microscopic polyangiitis, and pemphigus vulgaris. This medicine may be used for other purposes;  ask your health care provider or pharmacist if you have questions. COMMON BRAND NAME(S): RIABNI, Rituxan, RUXIENCE What should I tell my care team before I take this medication? They need to know if you have any of these conditions: chest  pain heart disease infection especially a viral infection such as chickenpox, cold sores, hepatitis B, or herpes immune system problems irregular heartbeat or rhythm kidney disease low blood counts (white cells, platelets, or red cells) lung disease recent or upcoming vaccine an unusual or allergic reaction to rituximab, other medicines, foods, dyes, or preservatives pregnant or trying to get pregnant breast-feeding How should I use this medication? This medicine is injected into a vein. It is given by a health care provider in a hospital or clinic setting. A special MedGuide will be given to you before each treatment. Be sure to read this information carefully each time. Talk to your health care provider about the use of this medicine in children. While this drug may be prescribed for children as young as 6 months for selected conditions, precautions do apply. Overdosage: If you think you have taken too much of this medicine contact a poison control center or emergency room at once. NOTE: This medicine is only for you. Do not share this medicine with others. What if I miss a dose? Keep appointments for follow-up doses. It is important not to miss your dose. Call your health care provider if you are unable to keep an appointment. What may interact with this medication? Do not take this medicine with any of the following medicines: live vaccines This medicine may also interact with the following medicines: cisplatin This list may not describe all possible interactions. Give your health care provider a list of all the medicines, herbs, non-prescription drugs, or dietary supplements you use. Also tell them if you smoke, drink alcohol, or use illegal drugs. Some items may interact with your medicine. What should I watch for while using this medication? Your condition will be monitored carefully while you are receiving this medicine. You may need blood work done while you are taking this  medicine. This medicine can cause serious infusion reactions. To reduce the risk your health care provider may give you other medicines to take before receiving this one. Be sure to follow the directions from your health care provider. This medicine may increase your risk of getting an infection. Call your health care provider for advice if you get a fever, chills, sore throat, or other symptoms of a cold or flu. Do not treat yourself. Try to avoid being around people who are sick. Call your health care provider if you are around anyone with measles, chickenpox, or if you develop sores or blisters that do not heal properly. Avoid taking medicines that contain aspirin, acetaminophen, ibuprofen, naproxen, or ketoprofen unless instructed by your health care provider. These medicines may hide a fever. This medicine may cause serious skin reactions. They can happen weeks to months after starting the medicine. Contact your health care provider right away if you notice fevers or flu-like symptoms with a rash. The rash may be red or purple and then turn into blisters or peeling of the skin. Or, you might notice a red rash with swelling of the face, lips or lymph nodes in your neck or under your arms. In some patients, this medicine may cause a serious brain infection that may cause death. If you have any problems seeing, thinking, speaking, walking, or standing, tell your healthcare professional right away. If you  cannot reach your healthcare professional, urgently seek other source of medical care. Do not become pregnant while taking this medicine or for at least 12 months after stopping it. Women should inform their health care provider if they wish to become pregnant or think they might be pregnant. There is potential for serious harm to an unborn child. Talk to your health care provider for more information. Women should use a reliable form of birth control while taking this medicine and for 12 months after  stopping it. Do not breast-feed while taking this medicine or for at least 6 months after stopping it. What side effects may I notice from receiving this medication? Side effects that you should report to your health care provider as soon as possible: allergic reactions (skin rash, itching or hives; swelling of the face, lips, or tongue) diarrhea edema (sudden weight gain; swelling of the ankles, feet, hands or other unusual swelling; trouble breathing) fast, irregular heartbeat heart attack (trouble breathing; pain or tightness in the chest, neck, back or arms; unusually weak or tired) infection (fever, chills, cough, sore throat, pain or trouble passing urine) kidney injury (trouble passing urine or change in the amount of urine) liver injury (dark yellow or brown urine; general ill feeling or flu-like symptoms; loss of appetite, right upper belly pain; unusually weak or tired, yellowing of the eyes or skin) low blood pressure (dizziness; feeling faint or lightheaded, falls; unusually weak or tired) low red blood cell counts (trouble breathing; feeling faint; lightheaded, falls; unusually weak or tired) mouth sores redness, blistering, peeling, or loosening of the skin, including inside the mouth stomach pain unusual bruising or bleeding wheezing (trouble breathing with loud or whistling sounds) vomiting Side effects that usually do not require medical attention (report to your health care provider if they continue or are bothersome): headache joint pain muscle cramps, pain nausea This list may not describe all possible side effects. Call your doctor for medical advice about side effects. You may report side effects to FDA at 1-800-FDA-1088. Where should I keep my medication? This medicine is given in a hospital or clinic. It will not be stored at home. NOTE: This sheet is a summary. It may not cover all possible information. If you have questions about this medicine, talk to your  doctor, pharmacist, or health care provider.  2022 Elsevier/Gold Standard (2020-07-29 15:47:26)

## 2021-05-30 NOTE — Progress Notes (Signed)
[facility] OFFICE PROGRESS NOTE  Patient Care Team: [name], MD as PCP - General (Unknown Physician Specialty) Wellington Hampshire, MD as PCP - Cardiology (Cardiology) Byrnett, Forest Gleason, MD (General Surgery) Forest Gleason, MD (Inactive) (Oncology) Wellington Hampshire, MD as Consulting Physician (Cardiology) Cammie Sickle, MD as Consulting Physician (Hematology and Oncology)  Cancer Staging No matching staging information was found for the patient.    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;  Rituxan once a week started in January of 2013  # July 2016-RECURRENCE- LYMPH NODE, LEFT AXILLA; EXCISIONAL BIOPSY:  - LOW-GRADE B-CELL LYMPHOMA, IMMUNOPHENOTYPICALLY MOST CONSISTENT WITH CLL/SLL, PET April 2018- STABLE/slight progression  # acute MI in December of 2014; #  upper and lower endoscopy done in May of 2015 ---------------------------------------------------   DIAGNOSIS: Follicular/MARGINAL ZONE LYMPHOMA/ SLL   STAGE:   IV   ;GOALS: control  CURRENT/MOST RECENT THERAPY : surveillance     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  Accompanied by son.  Jeanne Pittman 86 y.o.  female pleasant patient above history of recurrent low-grade  lymphoma/B cell non-Hodgkin type-concerns for progression noted on recent imaging in August 2022.  Patient continues to lose weight.  Complains of fatigue.  Poor appetite.  No fever no chills.  No night sweats.   Review of Systems  Constitutional:  Positive for malaise/fatigue and weight loss. Negative for chills, diaphoresis and fever.  HENT:  Negative for nosebleeds and sore throat.   Eyes:  Negative for double vision.  Respiratory:  Negative for cough, hemoptysis, sputum production, shortness of breath and wheezing.   Cardiovascular:  Negative for chest pain, palpitations, orthopnea and leg swelling.  Gastrointestinal:  Negative for abdominal pain, blood in stool, constipation, diarrhea, heartburn, melena, nausea and vomiting.  Genitourinary:  Negative for dysuria, frequency and urgency.  Musculoskeletal:  Positive for back pain and joint pain.  Skin: Negative.  Negative for itching and rash.  Neurological:  Negative for dizziness, tingling, focal weakness, weakness and headaches.  Endo/Heme/Allergies:  Does not bruise/bleed easily.  Psychiatric/Behavioral:  Negative for depression. The patient is not nervous/anxious and does not have insomnia.    PAST MEDICAL HISTORY :  Past Medical History:  Diagnosis Date  . A-fib ([place])    07/2012: A. fib with RVR in the setting of myocardial infarction. Converted to sinus rhythm with amiodarone. No recurrence.  . Anxiety   . Atrophic vaginitis   . Chronic combined systolic (congestive) and diastolic (congestive) heart failure ([place])    a. EF was 25-35% post MI but improved to 45-50% in 04/2013; b. 03/2017 Echo: EF 40-45%, mod focal/basal hypertrophy of septum. Sev mid-apicalanteroseptal, ant, and apical HK. Gr1 DD, mild AI/MR, mod TR, PASP 26mHg.  . Colon adenomas   . Coronary artery disease  a. 19/3790 STEMI complicated by cardiogenic shock. Cath/PCI: LAD 159m (DESx2), RCA 95p(DES). EF 25%; b. 03/2017 MV: EF 57%, fixed apical, periapical, mid  to distal anteroseptal defect. No ischemia.  . Eczema   . Fibrocystic breast disease   . Gastritis   . GERD (gastroesophageal reflux disease)   . Glaucoma   . Headache   . Hyperlipidemia   . Hypertension   . Hypothyroidism   . Insomnia   . Ischemic cardiomyopathy    a. 07/2012 EF 25-35% following MI-->Improved to 45-50%;  b. 03/2017 Echo: EF 40-45%.  . Myocardial infarction ([place]) 08/18/2014  . Non Hodgkin's lymphoma ([place]) 2005   reoccurance 2007 and 2012  . Osteoarthritis   . Osteoporosis   . Polyneuropathy   . Polyposis of colon   . Restless leg syndrome   . Shingles    right  . Urinary, incontinence, stress female     PAST SURGICAL HISTORY :   Past Surgical History:  Procedure Laterality Date  . ABDOMINAL HYSTERECTOMY  1956  . APPENDECTOMY    . AXILLARY LYMPH NODE BIOPSY Left 03/18/2015   Procedure: AXILLARY LYMPH NODE BIOPSY;  Surgeon: [name], MD;  Location: [name];  Service: General;  Laterality: Left;  . CARDIAC CATHETERIZATION  08/19/2012   [name]; [place]  . COLONOSCOPY    . COLONOSCOPY WITH PROPOFOL N/A 03/02/2016   Procedure: COLONOSCOPY WITH PROPOFOL;  Surgeon: Manya Silvas, MD;  Location[facility] ENDOSCOPY;  Service: Endoscopy;  Laterality: N/A;  . COLONOSCOPY WITH PROPOFOL N/A 09/26/2017   Procedure: COLONOSCOPY WITH PROPOFOL;  Surgeon: Manya Silvas, MD;  Location: [facility] ENDOSCOPY;  Service: Endoscopy;  Laterality: N/A;  . COLONOSCOPY WITH PROPOFOL N/A 03/11/2020   Procedure: COLONOSCOPY WITH PROPOFOL;  Surgeon: Benjamine Sprague, DO;  Location: [name] ENDOSCOPY;  Service: General;  Laterality: N/A;  . CORONARY ANGIOPLASTY WITH STENT PLACEMENT     x3 stents  . CORONARY ANGIOPLASTY WITH STENT PLACEMENT    . CORONARY ARTERY BYPASS GRAFT    . ESOPHAGOGASTRODUODENOSCOPY (EGD) WITH PROPOFOL N/A 03/02/2016   Procedure: ESOPHAGOGASTRODUODENOSCOPY (EGD) WITH PROPOFOL;  Surgeon: Manya Silvas, MD;  Location: [facility] ENDOSCOPY;  Service: Endoscopy;  Laterality: N/A;   . ESOPHAGOGASTRODUODENOSCOPY (EGD) WITH PROPOFOL N/A 04/14/2019   Procedure: ESOPHAGOGASTRODUODENOSCOPY (EGD) WITH PROPOFOL;  Surgeon: Toledo, Benay Pike, MD;  Location: [name] ENDOSCOPY;  Service: Gastroenterology;  Laterality: N/A;  . ESOPHAGOGASTRODUODENOSCOPY (EGD) WITH PROPOFOL N/A 12/07/2020   Procedure: ESOPHAGOGASTRODUODENOSCOPY (EGD) WITH PROPOFOL;  Surgeon: Lesly Rubenstein, MD;  Location: [name] ENDOSCOPY;  Service: Endoscopy;  Laterality: N/A;  . LYMPH NODE BIOPSY Right 07-24-11   Dr Bary Castilla  . MOHS SURGERY     bilateral shoulders  . PTCA    . skin cancer removal    . TOTAL ABDOMINAL HYSTERECTOMY W/ BILATERAL SALPINGOOPHORECTOMY      FAMILY HISTORY :   Family History  Problem Relation Age of Onset  . Heart attack Father   . Heart disease Brother   . Leukemia Mother   . Bladder Cancer Neg Hx   . Prostate cancer Neg Hx   . Kidney cancer Neg Hx   . Breast cancer Neg Hx     SOCIAL HISTORY:   Social History   Tobacco Use  . Smoking status: Former    Types: Cigarettes    Quit date: 1950    Years since quitting: 72.8  . Smokeless tobacco: Never  Vaping Use  . Vaping Use: Never used  Substance Use Topics  . Alcohol use: No  Alcohol/week: 0.0 standard drinks  . Drug use: No    ALLERGIES:  is allergic to ace inhibitors, benadryl [diphenhydramine hcl], codeine, diphenhydramine, guaiacol, hydrocodone, cardizem [diltiazem], and guaifenesin & derivatives.  MEDICATIONS:  Current Outpatient Medications  Medication Sig Dispense Refill  . aspirin 81 MG tablet Take 81 mg by mouth every morning.     . Cholecalciferol (VITAMIN D-3) 5000 UNITS TABS Take 2,000 Int'l Units by mouth every morning.     . DULoxetine (CYMBALTA) 20 MG capsule Take 20 mg by mouth daily.    [name] losartan (COZAAR) 25 MG tablet TAKE 1/2 TABLET(12.5 MG) BY MOUTH DAILY AFTER BREAKFAST 45 tablet 3  . rosuvastatin (CRESTOR) 10 MG tablet TAKE 1 TABLET(10 MG) BY MOUTH EVERY OTHER DAY 45 tablet 0  . timolol  (TIMOPTIC) 0.5 % ophthalmic solution Place 1 drop into both eyes 2 (two) times daily.    [name] acetaminophen (TYLENOL) 650 MG CR tablet Take 650 mg by mouth every 8 (eight) hours as needed for pain. (Patient not taking: Reported on 05/30/2021)    . acyclovir (ZOVIRAX) 400 MG tablet Take 1 tablet (400 mg total) by mouth 2 (two) times daily. [to prevent shingles] 60 tablet 3  . carvedilol (COREG) 6.25 MG tablet TAKE 1 TABLET(6.25 MG) BY MOUTH TWICE DAILY WITH A MEAL 180 tablet 1  . fexofenadine (ALLEGRA) 180 MG tablet Take 180 mg by mouth daily as needed for rhinitis.  (Patient not taking: Reported on 05/30/2021)    . ibuprofen (ADVIL) 800 MG tablet Take 800 mg by mouth every 6 (six) hours as needed. (Patient not taking: Reported on 05/30/2021)    . latanoprost (XALATAN) 0.005 % ophthalmic solution Place 1 drop into both eyes at bedtime. (Patient not taking: Reported on 05/30/2021)    . LORazepam (ATIVAN) 0.5 MG tablet Take 0.5 mg by mouth every 8 (eight) hours as needed for anxiety.  (Patient not taking: Reported on 05/30/2021)    . pantoprazole (PROTONIX) 40 MG tablet TAKE 1 TABLET(40 MG) BY MOUTH TWICE DAILY (Patient not taking: Reported on 05/30/2021) 180 tablet 1   No current facility-administered medications for this visit.    PHYSICAL EXAMINATION: ECOG PERFORMANCE STATUS: 0 - Asymptomatic  BP (!) 126/58   Pulse 66   Temp (!) 96.6 F (35.9 C) (Tympanic)   Resp 18   Ht 5\' 1"  (1.549 m)   Wt 95 lb (43.1 kg)   SpO2 99%   BMI 17.95 kg/m   Filed Weights   05/30/21 0830  Weight: 95 lb (43.1 kg)    Physical Exam Constitutional:      Comments: She is accompanied by her son.  Ambulating independently.  HENT:     Head: Normocephalic and atraumatic.     Mouth/Throat:     Pharynx: No oropharyngeal exudate.  Eyes:     Pupils: Pupils are equal, round, and reactive to light.  Neck:     Comments: Bilateral neck/axillary adenopathy. Cardiovascular:     Rate and Rhythm: Normal rate and  regular rhythm.  Pulmonary:     Effort: No respiratory distress.     Breath sounds: No wheezing.  Abdominal:     General: Bowel sounds are normal. There is no distension.     Palpations: Abdomen is soft. There is no mass.     Tenderness: There is no abdominal tenderness. There is no guarding or rebound.  Musculoskeletal:        General: No tenderness. Normal range of motion.     Cervical back:  Normal range of motion and neck supple.  Lymphadenopathy:     Comments: Bilateral axillary adenopathy 1-2 cm in size.   Skin:    General: Skin is warm.  Neurological:     Mental Status: She is alert and oriented to person, place, and time.  Psychiatric:        Mood and Affect: Affect normal.     LABORATORY DATA:  I have reviewed the data as listed    Component Value Date/Time   NA 136 05/30/2021 0757   NA 141 09/07/2014 1449   K 3.6 05/30/2021 0757   K 4.0 09/07/2014 1449   CL 100 05/30/2021 0757   CL 106 09/07/2014 1449   CO2 28 05/30/2021 0757   CO2 29 09/07/2014 1449   GLUCOSE 114 (H) 05/30/2021 0757   GLUCOSE 109 (H) 09/07/2014 1449   BUN 19 05/30/2021 0757   BUN 19 (H) 09/07/2014 1449   CREATININE 0.96 05/30/2021 0757   CREATININE 1.01 09/07/2014 1449   CALCIUM 9.3 05/30/2021 0757   CALCIUM 8.8 09/07/2014 1449   PROT 7.3 05/30/2021 0757   PROT 6.3 09/02/2015 0824   PROT 6.5 09/07/2014 1449   ALBUMIN 4.0 05/30/2021 0757   ALBUMIN 4.2 09/02/2015 0824   ALBUMIN 3.8 09/07/2014 1449   AST 17 05/30/2021 0757   AST 11 (L) 09/07/2014 1449   ALT 11 05/30/2021 0757   ALT 22 09/07/2014 1449   ALKPHOS 53 05/30/2021 0757   ALKPHOS 75 09/07/2014 1449   BILITOT 0.7 05/30/2021 0757   BILITOT 0.4 09/02/2015 0824   BILITOT 0.8 09/07/2014 1449   GFRNONAA 58 (L) 05/30/2021 0757   GFRNONAA 56 (L) 09/07/2014 1449   GFRNONAA 44 (L) 02/09/2014 1351   GFRAA 59 (L) 02/27/2020 1023   GFRAA >60 09/07/2014 1449   GFRAA 51 (L) 02/09/2014 1351    No results found for: SPEP, UPEP  Lab  Results  Component Value Date   WBC 6.8 05/30/2021   NEUTROABS 2.4 05/30/2021   HGB 11.7 (L) 05/30/2021   HCT 34.3 (L) 05/30/2021   MCV 94.8 05/30/2021   PLT 136 (L) 05/30/2021      Chemistry      Component Value Date/Time   NA 136 05/30/2021 0757   NA 141 09/07/2014 1449   K 3.6 05/30/2021 0757   K 4.0 09/07/2014 1449   CL 100 05/30/2021 0757   CL 106 09/07/2014 1449   CO2 28 05/30/2021 0757   CO2 29 09/07/2014 1449   BUN 19 05/30/2021 0757   BUN 19 (H) 09/07/2014 1449   CREATININE 0.96 05/30/2021 0757   CREATININE 1.01 09/07/2014 1449      Component Value Date/Time   CALCIUM 9.3 05/30/2021 0757   CALCIUM 8.8 09/07/2014 1449   ALKPHOS 53 05/30/2021 0757   ALKPHOS 75 09/07/2014 1449   AST 17 05/30/2021 0757   AST 11 (L) 09/07/2014 1449   ALT 11 05/30/2021 0757   ALT 22 09/07/2014 1449   BILITOT 0.7 05/30/2021 0757   BILITOT 0.4 09/02/2015 0824   BILITOT 0.8 09/07/2014 1449      RADIOGRAPHIC STUDIES: I have personally reviewed the radiological images as listed and agreed with the findings in the report. No results found.   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 Status post multiple lines of therapy in the past; PET scan- July 2022- Signs of nodal enlargement, mild nodal enlargement  in the chest and abdomen as well as the pelvis with slight increase in general of SUVvalues in the range of Deauville 3/Deauville 4, greatest trend of worsening in the abdomen and pelvis.  Lung nodules-right upper lobe-perifissural suspicious of lymphoma rather than infection/inflammation.  # Proceed with rituximab- cycle #1 today; weekly x4.  Again reviewed the potential side effects including but not limited to infusion reactions.  Continue dexamethasone/Benadryl.  # Mild thrombocytopenia- 139 STABLE.   # Weight loss: 6-7 pounds in 12 months; poor  apetite/ Fatigue-chronic lymphoma versus others-monitor for now on   #Infection prophylaxis shingles: Recommend acyclovir 400 twice daily: s/p EVUSHELD;   # DISPOSITION:  # rituxan today # 1 week- MD; labs- cbc/bmp; Rituximab- Dr.B        Orders Placed This Encounter  Procedures  . CBC with Differential    Standing Status:   Future    Standing Expiration Date:   05/30/2022  . Basic metabolic panel    Standing Status:   Future    Standing Expiration Date:   05/30/2022    All questions were answered. The patient knows to call the clinic with any problems, questions or concerns.      Cammie Sickle, MD 05/30/2021 5:05 PM

## 2021-05-30 NOTE — Progress Notes (Signed)
Rituximab titrated per policy.  Pt tolerated infusion well with no problems.  Pt reported a slight headache towards the end of the infusion, but no other complaints noted.  VSS throughout the infusion.  Pt left infusion suite stable and ambulatory.

## 2021-05-30 NOTE — Assessment & Plan Note (Addendum)
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 Status post multiple lines of therapy in the past; PET scan- July 2022- Signs of nodal enlargement, mild nodal enlargement in the chest and abdomen as well as the pelvis with slight increase in general of SUVvalues in the range of Deauville 3/Deauville 4, greatest trend of worsening in the abdomen and pelvis.  Lung nodules-right upper lobe-perifissural suspicious of lymphoma rather than infection/inflammation.  # Proceed with rituximab- cycle #1 today; weekly x4.  Again reviewed the potential side effects including but not limited to infusion reactions.  Continue dexamethasone/Benadryl.  # Mild thrombocytopenia- 139 STABLE.   # Weight loss: 6-7 pounds in 12 months; poor apetite/ Fatigue-chronic lymphoma versus others-monitor for now on   #Infection prophylaxis shingles: Recommend acyclovir 400 twice daily: s/p EVUSHELD;   # DISPOSITION:  # rituxan today # 1 week- MD; labs- cbc/bmp; Rituximab- Dr.B

## 2021-05-31 LAB — HEPATITIS B E ANTIGEN: Hep B E Ag: NEGATIVE

## 2021-06-01 LAB — HEPATITIS C ANTIBODY: HCV Ab: <0.1 s/co ratio — AB (ref 0.0–0.9)

## 2021-06-05 ENCOUNTER — Other Ambulatory Visit: Payer: Self-pay | Admitting: Cardiovascular Disease

## 2021-06-06 ENCOUNTER — Encounter: Payer: Self-pay | Admitting: Internal Medicine

## 2021-06-06 ENCOUNTER — Inpatient Hospital Stay: Payer: Medicare Other

## 2021-06-06 ENCOUNTER — Other Ambulatory Visit: Payer: Self-pay

## 2021-06-06 ENCOUNTER — Other Ambulatory Visit: Payer: Self-pay | Admitting: *Deleted

## 2021-06-06 ENCOUNTER — Inpatient Hospital Stay (HOSPITAL_BASED_OUTPATIENT_CLINIC_OR_DEPARTMENT_OTHER): Payer: Medicare Other | Admitting: Internal Medicine

## 2021-06-06 VITALS — BP 134/65 | HR 66

## 2021-06-06 DIAGNOSIS — C8308 Small cell B-cell lymphoma, lymph nodes of multiple sites: Secondary | ICD-10-CM

## 2021-06-06 DIAGNOSIS — C8304 Small cell B-cell lymphoma, lymph nodes of axilla and upper limb: Secondary | ICD-10-CM

## 2021-06-06 DIAGNOSIS — Z5112 Encounter for antineoplastic immunotherapy: Secondary | ICD-10-CM | POA: Diagnosis not present

## 2021-06-06 LAB — BASIC METABOLIC PANEL
Anion gap: 7 (ref 5–15)
BUN: 20 mg/dL (ref 8–23)
CO2: 28 mmol/L (ref 22–32)
Calcium: 9.1 mg/dL (ref 8.9–10.3)
Chloride: 98 mmol/L (ref 98–111)
Creatinine, Ser: 0.91 mg/dL (ref 0.44–1.00)
GFR, Estimated: >60 mL/min (ref >60)
Glucose, Bld: 121 mg/dL — ABNORMAL HIGH (ref 70–99)
Potassium: 3.7 mmol/L (ref 3.5–5.1)
Sodium: 133 mmol/L — ABNORMAL LOW (ref 135–145)

## 2021-06-06 LAB — CBC WITH DIFFERENTIAL/PLATELET
Abs Immature Granulocytes: 0.02 10*3/uL (ref 0.00–0.07)
Basophils Absolute: 0.1 10*3/uL (ref 0.0–0.1)
Basophils Relative: 1 %
Eosinophils Absolute: 0.2 10*3/uL (ref 0.0–0.5)
Eosinophils Relative: 2 %
HCT: 35.3 % — ABNORMAL LOW (ref 36.0–46.0)
Hemoglobin: 12.1 g/dL (ref 12.0–15.0)
Immature Granulocytes: 0 %
Lymphocytes Relative: 57 %
Lymphs Abs: 4.3 10*3/uL — ABNORMAL HIGH (ref 0.7–4.0)
MCH: 32.4 pg (ref 26.0–34.0)
MCHC: 34.3 g/dL (ref 30.0–36.0)
MCV: 94.6 fL (ref 80.0–100.0)
Monocytes Absolute: 0.5 10*3/uL (ref 0.1–1.0)
Monocytes Relative: 6 %
Neutro Abs: 2.6 10*3/uL (ref 1.7–7.7)
Neutrophils Relative %: 34 %
Platelets: 144 10*3/uL — ABNORMAL LOW (ref 150–400)
RBC: 3.73 MIL/uL — ABNORMAL LOW (ref 3.87–5.11)
RDW: 13.6 % (ref 11.5–15.5)
WBC: 7.6 10*3/uL (ref 4.0–10.5)
nRBC: 0 % (ref 0.0–0.2)

## 2021-06-06 MED ORDER — ACETAMINOPHEN 325 MG PO TABS
650.0000 mg | ORAL_TABLET | Freq: Once | ORAL | Status: AC
  Administered 2021-06-06: 650 mg via ORAL
  Filled 2021-06-06: qty 2

## 2021-06-06 MED ORDER — SODIUM CHLORIDE 0.9 % IV SOLN
375.0000 mg/m2 | Freq: Once | INTRAVENOUS | Status: AC
  Administered 2021-06-06: 500 mg via INTRAVENOUS
  Filled 2021-06-06: qty 50

## 2021-06-06 MED ORDER — RITUXIMAB-PVVR CHEMO 500 MG/50ML IV SOLN: 375.0000 mg/m2 | Freq: Once | INTRAVENOUS | Status: DC

## 2021-06-06 MED ORDER — DIPHENHYDRAMINE HCL 25 MG PO CAPS
25.0000 mg | ORAL_CAPSULE | Freq: Once | ORAL | Status: AC
  Administered 2021-06-06: 25 mg via ORAL
  Filled 2021-06-06: qty 1

## 2021-06-06 MED ORDER — SODIUM CHLORIDE 0.9 % IV SOLN
Freq: Once | INTRAVENOUS | Status: AC
  Filled 2021-06-06: qty 250

## 2021-06-06 MED ORDER — SODIUM CHLORIDE 0.9 % IV SOLN
12.0000 mg | Freq: Once | INTRAVENOUS | Status: AC
  Administered 2021-06-06: 12 mg via INTRAVENOUS
  Filled 2021-06-06: qty 1.2

## 2021-06-06 NOTE — Progress Notes (Signed)
Took 1st treatment last week. Has fatigue from the treatment. Appetite is low. Had nausea during the infusion of Rituxan. Has chronic neck pain or discomfort. Weight loss is concerning. 94.6 lbs today. The protein shakes tend to give her diarrhea.

## 2021-06-06 NOTE — Assessment & Plan Note (Addendum)
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 Status post multiple lines of therapy in the past; PET scan- July 2022- Signs of nodal enlargement, mild nodal enlargement in the chest and abdomen as well as the pelvis with slight increase in general of SUVvalues in the range of Deauville 3/Deauville 4, greatest trend of worsening in the abdomen and pelvis.  Lung nodules-right upper lobe-perifissural suspicious of lymphoma rather than infection/inflammation. Currently on single agent rituxan weekly.  # Proceed with rituximab- cycle #2 today; of planned weekly x4.   # Mild thrombocytopenia- 144- STABLE.  # Weight loss: 6-7 pounds in 12 months; poor apetite/ Fatigue-chronic lymphoma versus others-monitor for now on - STABLE.   #Infection prophylaxis shingles: currently on acyclovir 400 twice daily: s/p EVUSHELD;   # DISPOSITION:  # rituxan today # cancel NOV scans # 1 week- labs- cbc/cmp; Rituxan # 2 week- MD; labs- cbc/bmp; Rituximab- Dr.B

## 2021-06-06 NOTE — Progress Notes (Signed)
[facility] OFFICE PROGRESS NOTE  Patient Care Team: [name], MD as PCP - General (Unknown Physician Specialty) Wellington Hampshire, MD as PCP - Cardiology (Cardiology) Byrnett, Forest Gleason, MD (General Surgery) Forest Gleason, MD (Inactive) (Oncology) Wellington Hampshire, MD as Consulting Physician (Cardiology) Cammie Sickle, MD as Consulting Physician (Hematology and Oncology)  Cancer Staging No matching staging information was found for the patient.    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  Accompanied by son.  Jeanne Pittman 86 y.o.  female pleasant  patient above history of recurrent low-grade lymphoma/B cell non-Hodgkin-currently on weekly rituximab is here for follow-up.  Patient received rituximab cycle number one 1 week ago.  Tolerated well except for drowsiness from Benadryl.  Patient continues to complain of poor appetite of fatigue.  Positive weight loss.  Review of Systems  Constitutional:  Positive for malaise/fatigue and weight loss. Negative for chills, diaphoresis and fever.  HENT:  Negative for nosebleeds and sore throat.   Eyes:  Negative for double vision.  Respiratory:  Negative for cough, hemoptysis, sputum production, shortness of breath and wheezing.   Cardiovascular:  Negative for chest pain, palpitations, orthopnea and leg swelling.  Gastrointestinal:  Negative for abdominal pain, blood in stool, constipation, diarrhea, heartburn, melena, nausea and vomiting.  Genitourinary:  Negative for dysuria, frequency and urgency.  Musculoskeletal:  Positive for back pain and joint pain.  Skin: Negative.  Negative for itching and rash.  Neurological:  Negative for dizziness, tingling, focal weakness, weakness and headaches.  Endo/Heme/Allergies:  Does not bruise/bleed easily.  Psychiatric/Behavioral:  Negative for depression. The patient is not nervous/anxious and does not have insomnia.    PAST MEDICAL HISTORY :  Past Medical History:  Diagnosis Date   A-fib ([place])    07/2012: A. fib with RVR in the setting of myocardial infarction. Converted to sinus rhythm with amiodarone. No recurrence.   Anxiety    Atrophic vaginitis    Chronic combined systolic (congestive) and diastolic (congestive) heart failure ([place])    a. EF was 25-35% post MI but improved to 45-50% in 04/2013; b. 03/2017 Echo: EF 40-45%, mod focal/basal hypertrophy of septum. Sev mid-apicalanteroseptal, ant, and apical HK. Gr1 DD, mild  AI/MR, mod TR, PASP 56mHg.   Colon adenomas    Coronary artery disease    a. 16/0109 STEMI complicated by cardiogenic shock.  Cath/PCI: LAD 154m (DESx2), RCA 95p(DES). EF 25%; b. 03/2017 MV: EF 57%, fixed apical, periapical, mid to distal anteroseptal defect. No ischemia.   Eczema    Fibrocystic breast disease    Gastritis    GERD (gastroesophageal reflux disease)    Glaucoma    Headache    Hyperlipidemia    Hypertension    Hypothyroidism    Insomnia    Ischemic cardiomyopathy    a. 07/2012 EF 25-35% following MI-->Improved to 45-50%;  b. 03/2017 Echo: EF 40-45%.   Myocardial infarction ([place]) 08/18/2014   Non Hodgkin's lymphoma ([name]) 2005   reoccurance 2007 and 2012   Osteoarthritis    Osteoporosis    Polyneuropathy    Polyposis of colon    Restless leg syndrome    Shingles    right   Urinary, incontinence, stress female     PAST SURGICAL HISTORY :   Past Surgical History:  Procedure Laterality Date   ABDOMINAL HYSTERECTOMY  1956   APPENDECTOMY     AXILLARY LYMPH NODE BIOPSY Left 03/18/2015   Procedure: AXILLARY LYMPH NODE BIOPSY;  Surgeon: [name], MD;  Location: [name];  Service: General;  Laterality: Left;   CARDIAC CATHETERIZATION  08/19/2012   [name]; [facility]   COLONOSCOPY     COLONOSCOPY WITH PROPOFOL N/A 03/02/2016   Procedure: COLONOSCOPY WITH PROPOFOL;  Surgeon: Manya Silvas, MD;  [facility];  Service: Endoscopy;  Laterality: N/A;   COLONOSCOPY WITH PROPOFOL N/A 09/26/2017   Procedure: COLONOSCOPY WITH PROPOFOL;  Surgeon: Manya Silvas, MD;  Location: [facility] ENDOSCOPY;  Service: Endoscopy;  Laterality: N/A;   COLONOSCOPY WITH PROPOFOL N/A 03/11/2020   Procedure: COLONOSCOPY WITH PROPOFOL;  Surgeon: Benjamine Sprague, DO;  Location: [name] ENDOSCOPY;  Service: General;  Laterality: N/A;   CORONARY ANGIOPLASTY WITH STENT PLACEMENT     x3 stents   CORONARY ANGIOPLASTY WITH STENT PLACEMENT     CORONARY ARTERY BYPASS GRAFT     ESOPHAGOGASTRODUODENOSCOPY (EGD) WITH PROPOFOL N/A 03/02/2016   Procedure: ESOPHAGOGASTRODUODENOSCOPY (EGD) WITH PROPOFOL;  Surgeon: Manya Silvas,  MD;  Location: [facility] ENDOSCOPY;  Service: Endoscopy;  Laterality: N/A;   ESOPHAGOGASTRODUODENOSCOPY (EGD) WITH PROPOFOL N/A 04/14/2019   Procedure: ESOPHAGOGASTRODUODENOSCOPY (EGD) WITH PROPOFOL;  Surgeon: Toledo, Benay Pike, MD;  Location: [name] ENDOSCOPY;  Service: Gastroenterology;  Laterality: N/A;   ESOPHAGOGASTRODUODENOSCOPY (EGD) WITH PROPOFOL N/A 12/07/2020   Procedure: ESOPHAGOGASTRODUODENOSCOPY (EGD) WITH PROPOFOL;  Surgeon: Lesly Rubenstein, MD;  Location: [name] ENDOSCOPY;  Service: Endoscopy;  Laterality: N/A;   LYMPH NODE BIOPSY Right 07-24-11   Dr Bary Castilla   MOHS SURGERY     bilateral shoulders   PTCA     skin cancer removal     TOTAL ABDOMINAL HYSTERECTOMY W/ BILATERAL SALPINGOOPHORECTOMY      FAMILY HISTORY :   Family History  Problem Relation Age of Onset   Heart attack Father    Heart disease Brother    Leukemia Mother    Bladder Cancer Neg Hx    Prostate cancer Neg Hx    Kidney cancer Neg Hx    Breast cancer Neg Hx     SOCIAL HISTORY:   Social History   Tobacco Use   Smoking status: Former    Types: Cigarettes    Quit date: 1950    Years since quitting: 72.8   Smokeless tobacco: Never  Vaping Use   Vaping Use: Never used  Substance Use Topics   Alcohol use: No    Alcohol/week: 0.0 standard drinks   Drug use: No    ALLERGIES:  is allergic to ace inhibitors, benadryl [diphenhydramine hcl], codeine, diphenhydramine, guaiacol, hydrocodone, cardizem [diltiazem], and guaifenesin & derivatives.  MEDICATIONS:  Current Outpatient Medications  Medication Sig Dispense Refill   acetaminophen (TYLENOL) 650 MG CR tablet Take 650 mg by mouth every 8 (eight) hours as needed for pain.     acyclovir (ZOVIRAX) 400 MG tablet Take 1 tablet (400 mg total) by mouth 2 (two) times daily. [to prevent shingles] 60 tablet 3   aspirin 81 MG tablet Take 81 mg by mouth every morning.      carvedilol (COREG) 6.25 MG tablet TAKE 1 TABLET(6.25 MG) BY MOUTH TWICE DAILY WITH A MEAL 180  tablet 1   Cholecalciferol (VITAMIN D-3) 5000 UNITS TABS Take 2,000 Int'l Units by mouth every morning.      DULoxetine (CYMBALTA) 20 MG capsule Take 20 mg by mouth daily.     latanoprost (XALATAN) 0.005 % ophthalmic solution Place 1 drop into both eyes at bedtime.     LORazepam (ATIVAN) 0.5 MG tablet Take 0.5 mg by mouth every 8 (eight) hours as needed for anxiety.     losartan (COZAAR) 25 MG tablet TAKE 1/2 TABLET(12.5 MG) BY MOUTH DAILY AFTER BREAKFAST 45 tablet 3   pantoprazole (PROTONIX) 40 MG tablet TAKE 1 TABLET(40 MG) BY MOUTH TWICE DAILY 180 tablet 1   rosuvastatin (CRESTOR) 10 MG tablet TAKE 1 TABLET(10 MG) BY MOUTH EVERY OTHER DAY 45 tablet 0   timolol (TIMOPTIC) 0.5 % ophthalmic solution Place 1 drop into both eyes 2 (two) times daily.     fexofenadine (ALLEGRA) 180 MG tablet Take 180 mg by mouth daily as needed for rhinitis.  (Patient not taking: No sig reported)     ibuprofen (ADVIL) 800 MG tablet Take 800 mg by mouth every 6 (six) hours as needed. (Patient not taking: No sig reported)     No current facility-administered medications for this visit.    PHYSICAL EXAMINATION: ECOG PERFORMANCE STATUS: 0 - Asymptomatic  BP (!) 118/51 (BP Location: Left Arm)   Pulse 63   Temp (!) 97.1 F (36.2 C) (Tympanic)   Wt 94 lb 9.6 oz (42.9 kg)   SpO2 100%   BMI 17.87 kg/m   Filed Weights   06/06/21 0903  Weight: 94 lb 9.6 oz (42.9 kg)     Physical Exam Constitutional:      Comments: She is accompanied by her son.  Ambulating independently.  HENT:     Head: Normocephalic and atraumatic.     Mouth/Throat:     Pharynx: No oropharyngeal exudate.  Eyes:     Pupils: Pupils are equal, round, and reactive to light.  Neck:     Comments: Bilateral neck/axillary adenopathy. Cardiovascular:     Rate and Rhythm: Normal rate and regular rhythm.  Pulmonary:     Effort: No respiratory distress.     Breath sounds: No wheezing.  Abdominal:     General: Bowel sounds are normal. There  is no distension.     Palpations: Abdomen is soft. There is no mass.     Tenderness: There is no abdominal tenderness. There is no guarding or rebound.  Musculoskeletal:        General: No tenderness. Normal range of motion.     Cervical back: Normal range of motion and neck supple.  Lymphadenopathy:     Comments: Bilateral axillary adenopathy 1-2 cm in size.   Skin:    General: Skin is warm.  Neurological:     Mental Status: She is alert and oriented to person, place, and time.  Psychiatric:        Mood and Affect: Affect normal.     LABORATORY DATA:  I have reviewed the data as listed    Component Value Date/Time   NA 133 (L) 06/06/2021 0841   NA 141 09/07/2014 1449   K 3.7 06/06/2021 0841   K 4.0 09/07/2014 1449   CL 98 06/06/2021 0841   CL 106 09/07/2014 1449   CO2 28 06/06/2021 0841   CO2 29 09/07/2014 1449   GLUCOSE 121 (H) 06/06/2021 0841   GLUCOSE 109 (H) 09/07/2014 1449   BUN 20 06/06/2021 0841   BUN 19 (H) 09/07/2014 1449   CREATININE 0.91 06/06/2021 0841   CREATININE 1.01 09/07/2014 1449   CALCIUM 9.1 06/06/2021 0841   CALCIUM 8.8 09/07/2014 1449   PROT 7.3 05/30/2021 0757   PROT 6.3 09/02/2015 0824   PROT 6.5 09/07/2014 1449   ALBUMIN 4.0 05/30/2021 0757   ALBUMIN 4.2 09/02/2015 0824   ALBUMIN 3.8 09/07/2014 1449   AST 17 05/30/2021 0757   AST 11 (L) 09/07/2014 1449   ALT 11 05/30/2021 0757   ALT 22 09/07/2014 1449   ALKPHOS 53 05/30/2021 0757   ALKPHOS 75 09/07/2014 1449   BILITOT 0.7 05/30/2021 0757   BILITOT 0.4 09/02/2015 0824   BILITOT 0.8 09/07/2014 1449   GFRNONAA >60 06/06/2021 0841   GFRNONAA 56 (L) 09/07/2014 1449   GFRNONAA 44 (L) 02/09/2014 1351   GFRAA 59 (L) 02/27/2020 1023   GFRAA >60 09/07/2014 1449   GFRAA 51 (L) 02/09/2014 1351    No results found for: SPEP, UPEP  Lab Results  Component Value Date   WBC 7.6 06/06/2021   NEUTROABS 2.6 06/06/2021   HGB 12.1 06/06/2021   HCT 35.3 (L) 06/06/2021   MCV 94.6 06/06/2021   PLT  144 (L) 06/06/2021      Chemistry      Component Value Date/Time   NA 133 (L) 06/06/2021 0841   NA 141 09/07/2014 1449   K 3.7 06/06/2021 0841   K 4.0 09/07/2014 1449   CL 98 06/06/2021 0841   CL 106 09/07/2014 1449   CO2 28 06/06/2021 0841   CO2 29 09/07/2014 1449   BUN 20 06/06/2021 0841   BUN 19 (H) 09/07/2014 1449   CREATININE 0.91 06/06/2021 0841   CREATININE 1.01 09/07/2014 1449      Component Value Date/Time   CALCIUM 9.1 06/06/2021 0841   CALCIUM 8.8 09/07/2014 1449   ALKPHOS 53 05/30/2021 0757   ALKPHOS 75 09/07/2014 1449   AST 17 05/30/2021 0757   AST 11 (L) 09/07/2014 1449   ALT 11 05/30/2021 0757   ALT 22 09/07/2014 1449   BILITOT 0.7 05/30/2021 0757   BILITOT 0.4 09/02/2015 0824   BILITOT 0.8 09/07/2014 1449      RADIOGRAPHIC STUDIES: I have personally reviewed the radiological images as listed and agreed with the findings in the report. No results found.   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 Status post multiple lines of therapy in the past; PET scan- July 2022- Signs of nodal enlargement, mild nodal enlargement in the chest and abdomen as well as the pelvis  with slight increase in general of SUVvalues in the range of Deauville 3/Deauville 4, greatest trend of worsening in the abdomen and pelvis.  Lung nodules-right upper lobe-perifissural suspicious of lymphoma rather than infection/inflammation.  # Proceed with rituximab- cycle #1 today; weekly x4.  Again reviewed the potential side effects including but not limited to infusion reactions.  Continue dexamethasone/Benadryl.  # Mild thrombocytopenia- 139 STABLE.   # Weight loss: 6-7 pounds in 12 months; poor apetite/ Fatigue-chronic lymphoma versus others-monitor for now on   #Infection prophylaxis shingles: Recommend acyclovir 400 twice daily: s/p EVUSHELD;   #  DISPOSITION:  # rituxan today # 1 week- MD; labs- cbc/bmp; Rituximab- Dr.B     No orders of the defined types were placed in this encounter.   All questions were answered. The patient knows to call the clinic with any problems, questions or concerns.      Cammie Sickle, MD 06/06/2021 9:28 AM

## 2021-06-06 NOTE — Patient Instructions (Signed)
[facility]  Discharge Instructions: Thank you for choosing [facility] to provide your oncology and hematology care.  If you have a lab appointment with the Cancer Center, please go directly to the Cancer Center and check in at the registration area.  Wear comfortable clothing and clothing appropriate for easy access to any Portacath or PICC line.   We strive to give you quality time with your provider. You may need to reschedule your appointment if you arrive late (15 or more minutes).  Arriving late affects you and other patients whose appointments are after yours.  Also, if you miss three or more appointments without notifying the office, you may be dismissed from the clinic at the provider's discretion.      For prescription refill requests, have your pharmacy contact our office and allow 72 hours for refills to be completed.    Today you received the following chemotherapy and/or immunotherapy agents       To help prevent nausea and vomiting after your treatment, we encourage you to take your nausea medication as directed.  BELOW ARE SYMPTOMS THAT SHOULD BE REPORTED IMMEDIATELY: *FEVER GREATER THAN 100.4 F (38 C) OR HIGHER *CHILLS OR SWEATING *NAUSEA AND VOMITING THAT IS NOT CONTROLLED WITH YOUR NAUSEA MEDICATION *UNUSUAL SHORTNESS OF BREATH *UNUSUAL BRUISING OR BLEEDING *URINARY PROBLEMS (pain or burning when urinating, or frequent urination) *BOWEL PROBLEMS (unusual diarrhea, constipation, pain near the anus) TENDERNESS IN MOUTH AND THROAT WITH OR WITHOUT PRESENCE OF ULCERS (sore throat, sores in mouth, or a toothache) UNUSUAL RASH, SWELLING OR PAIN  UNUSUAL VAGINAL DISCHARGE OR ITCHING   Items with * indicate a potential emergency and should be followed up as soon as possible or go to the Emergency Department if any problems should occur.  Please show the CHEMOTHERAPY ALERT CARD or IMMUNOTHERAPY ALERT CARD at check-in to the  Emergency Department and triage nurse.  Should you have questions after your visit or need to cancel or reschedule your appointment, please contact [facility]  [phone] and follow the prompts.  Office hours are 8:00 a.m. to 4:30 p.m. Monday - Friday. Please note that voicemails left after 4:00 p.m. may not be returned until the following business day.  We are closed weekends and major holidays. You have access to a nurse at all times for urgent questions. Please call the main number to the clinic [phone] and follow the prompts.  For any non-urgent questions, you may also contact your provider using MyChart. We now offer e-Visits for anyone 18 and older to request care online for non-urgent symptoms. For details visit [email or web address].   Also download the MyChart app! Go to the app store, search "MyChart", open the app, select [facility], and log in with your MyChart username and password.  Due to Covid, a mask is required upon entering the hospital/clinic. If you do not have a mask, one will be given to you upon arrival. For doctor visits, patients may have 1 support person aged 18 or older with them. For treatment visits, patients cannot have anyone with them due to current Covid guidelines and our immunocompromised population.  

## 2021-06-07 ENCOUNTER — Telehealth: Payer: Self-pay

## 2021-06-07 NOTE — Telephone Encounter (Signed)
Nutrition  Patient identified on Malnutrition Screening report for weight loss and poor appetite.   Called and spoke with patient. Does not think she can take on anything else right now.  RD tried to explain role in cancer care.  Patient declines services at this time.  RD available if can be of assistance.  Tyasia Packard B. Zenia Resides, [place], [place] Registered Dietitian [phone] (mobile)

## 2021-06-13 ENCOUNTER — Inpatient Hospital Stay: Payer: Medicare Other

## 2021-06-13 ENCOUNTER — Other Ambulatory Visit: Payer: Self-pay

## 2021-06-13 VITALS — BP 140/56 | HR 63 | Temp 96.0°F | Resp 19 | Wt 93.2 lb

## 2021-06-13 DIAGNOSIS — C8308 Small cell B-cell lymphoma, lymph nodes of multiple sites: Secondary | ICD-10-CM

## 2021-06-13 DIAGNOSIS — C8304 Small cell B-cell lymphoma, lymph nodes of axilla and upper limb: Secondary | ICD-10-CM

## 2021-06-13 DIAGNOSIS — Z5112 Encounter for antineoplastic immunotherapy: Secondary | ICD-10-CM | POA: Diagnosis not present

## 2021-06-13 LAB — COMPREHENSIVE METABOLIC PANEL
ALT: 12 U/L (ref 0–44)
AST: 14 U/L — ABNORMAL LOW (ref 15–41)
Albumin: 4 g/dL (ref 3.5–5.0)
Alkaline Phosphatase: 49 U/L (ref 38–126)
Anion gap: 6 (ref 5–15)
BUN: 27 mg/dL — ABNORMAL HIGH (ref 8–23)
CO2: 28 mmol/L (ref 22–32)
Calcium: 9.1 mg/dL (ref 8.9–10.3)
Chloride: 100 mmol/L (ref 98–111)
Creatinine, Ser: 0.91 mg/dL (ref 0.44–1.00)
GFR, Estimated: >60 mL/min (ref >60)
Glucose, Bld: 128 mg/dL — ABNORMAL HIGH (ref 70–99)
Potassium: 3.8 mmol/L (ref 3.5–5.1)
Sodium: 134 mmol/L — ABNORMAL LOW (ref 135–145)
Total Bilirubin: 0.8 mg/dL (ref 0.3–1.2)
Total Protein: 7 g/dL (ref 6.5–8.1)

## 2021-06-13 LAB — CBC WITH DIFFERENTIAL/PLATELET
Abs Immature Granulocytes: 0.03 10*3/uL (ref 0.00–0.07)
Basophils Absolute: 0 10*3/uL (ref 0.0–0.1)
Basophils Relative: 1 %
Eosinophils Absolute: 0.1 10*3/uL (ref 0.0–0.5)
Eosinophils Relative: 2 %
HCT: 35.9 % — ABNORMAL LOW (ref 36.0–46.0)
Hemoglobin: 12.5 g/dL (ref 12.0–15.0)
Immature Granulocytes: 0 %
Lymphocytes Relative: 48 %
Lymphs Abs: 3.5 10*3/uL (ref 0.7–4.0)
MCH: 32.6 pg (ref 26.0–34.0)
MCHC: 34.8 g/dL (ref 30.0–36.0)
MCV: 93.7 fL (ref 80.0–100.0)
Monocytes Absolute: 0.3 10*3/uL (ref 0.1–1.0)
Monocytes Relative: 4 %
Neutro Abs: 3.2 10*3/uL (ref 1.7–7.7)
Neutrophils Relative %: 45 %
Platelets: 134 10*3/uL — ABNORMAL LOW (ref 150–400)
RBC: 3.83 MIL/uL — ABNORMAL LOW (ref 3.87–5.11)
RDW: 14.1 % (ref 11.5–15.5)
WBC: 7.1 10*3/uL (ref 4.0–10.5)
nRBC: 0 % (ref 0.0–0.2)

## 2021-06-13 MED ORDER — SODIUM CHLORIDE 0.9 % IV SOLN: 375.0000 mg/m2 | Freq: Once | INTRAVENOUS | Status: DC

## 2021-06-13 MED ORDER — ACETAMINOPHEN 325 MG PO TABS
650.0000 mg | ORAL_TABLET | Freq: Once | ORAL | Status: AC
  Administered 2021-06-13: 650 mg via ORAL
  Filled 2021-06-13: qty 2

## 2021-06-13 MED ORDER — SODIUM CHLORIDE 0.9 % IV SOLN
12.0000 mg | Freq: Once | INTRAVENOUS | Status: AC
  Administered 2021-06-13: 12 mg via INTRAVENOUS
  Filled 2021-06-13: qty 1.2

## 2021-06-13 MED ORDER — SODIUM CHLORIDE 0.9 % IV SOLN
375.0000 mg/m2 | Freq: Once | INTRAVENOUS | Status: AC
  Administered 2021-06-13: 500 mg via INTRAVENOUS
  Filled 2021-06-13: qty 50

## 2021-06-13 MED ORDER — DIPHENHYDRAMINE HCL 25 MG PO CAPS
25.0000 mg | ORAL_CAPSULE | Freq: Once | ORAL | Status: AC
  Administered 2021-06-13: 25 mg via ORAL
  Filled 2021-06-13: qty 1

## 2021-06-13 MED ORDER — SODIUM CHLORIDE 0.9 % IV SOLN
Freq: Once | INTRAVENOUS | Status: AC
  Filled 2021-06-13: qty 250

## 2021-06-13 NOTE — Patient Instructions (Signed)
[facility]  Discharge Instructions: Thank you for choosing [facility] to provide your oncology and hematology care.  If you have a lab appointment with the [facility], please go directly to the [facility] and check in at the registration area.  Wear comfortable clothing and clothing appropriate for easy access to any Portacath or PICC line.   We strive to give you quality time with your provider. You may need to reschedule your appointment if you arrive late (15 or more minutes).  Arriving late affects you and other patients whose appointments are after yours.  Also, if you miss three or more appointments without notifying the office, you may be dismissed from the clinic at the provider's discretion.      For prescription refill requests, have your pharmacy contact our office and allow 72 hours for refills to be completed.    Today you received the following chemotherapy and/or immunotherapy agents Ruxience    To help prevent nausea and vomiting after your treatment, we encourage you to take your nausea medication as directed.  BELOW ARE SYMPTOMS THAT SHOULD BE REPORTED IMMEDIATELY: *FEVER GREATER THAN 100.4 F (38 C) OR HIGHER *CHILLS OR SWEATING *NAUSEA AND VOMITING THAT IS NOT CONTROLLED WITH YOUR NAUSEA MEDICATION *UNUSUAL SHORTNESS OF BREATH *UNUSUAL BRUISING OR BLEEDING *URINARY PROBLEMS (pain or burning when urinating, or frequent urination) *BOWEL PROBLEMS (unusual diarrhea, constipation, pain near the anus) TENDERNESS IN MOUTH AND THROAT WITH OR WITHOUT PRESENCE OF ULCERS (sore throat, sores in mouth, or a toothache) UNUSUAL RASH, SWELLING OR PAIN  UNUSUAL VAGINAL DISCHARGE OR ITCHING   Items with * indicate a potential emergency and should be followed up as soon as possible or go to the Emergency Department if any problems should occur.  Please show the CHEMOTHERAPY ALERT CARD or IMMUNOTHERAPY ALERT CARD at check-in to  the Emergency Department and triage nurse.  Should you have questions after your visit or need to cancel or reschedule your appointment, please contact [facility]  [phone] and follow the prompts.  Office hours are 8:00 a.m. to 4:30 p.m. Monday - Friday. Please note that voicemails left after 4:00 p.m. may not be returned until the following business day.  We are closed weekends and major holidays. You have access to a nurse at all times for urgent questions. Please call the main number to the clinic [phone] and follow the prompts.  For any non-urgent questions, you may also contact your provider using MyChart. We now offer e-Visits for anyone 84 and older to request care online for non-urgent symptoms. For details visit [email or web address].   Also download the MyChart app! Go to the app store, search "MyChart", open the app, select [facility], and log in with your MyChart username and password.  Due to Covid, a mask is required upon entering the hospital/clinic. If you do not have a mask, one will be given to you upon arrival. For doctor visits, patients may have 1 support person aged 84 or older or older with them. For treatment visits, patients cannot have anyone with them due to current Covid guidelines and our immunocompromised population.

## 2021-06-20 ENCOUNTER — Inpatient Hospital Stay: Payer: Medicare Other

## 2021-06-20 ENCOUNTER — Other Ambulatory Visit: Payer: Self-pay

## 2021-06-20 ENCOUNTER — Encounter: Payer: Self-pay | Admitting: Internal Medicine

## 2021-06-20 ENCOUNTER — Inpatient Hospital Stay (HOSPITAL_BASED_OUTPATIENT_CLINIC_OR_DEPARTMENT_OTHER): Payer: Medicare Other | Admitting: Internal Medicine

## 2021-06-20 VITALS — BP 139/57 | HR 64 | Temp 96.4°F | Resp 18

## 2021-06-20 DIAGNOSIS — Z5112 Encounter for antineoplastic immunotherapy: Secondary | ICD-10-CM | POA: Diagnosis not present

## 2021-06-20 DIAGNOSIS — C8304 Small cell B-cell lymphoma, lymph nodes of axilla and upper limb: Secondary | ICD-10-CM | POA: Diagnosis not present

## 2021-06-20 DIAGNOSIS — C8308 Small cell B-cell lymphoma, lymph nodes of multiple sites: Secondary | ICD-10-CM

## 2021-06-20 LAB — BASIC METABOLIC PANEL
Anion gap: 7 (ref 5–15)
BUN: 19 mg/dL (ref 8–23)
CO2: 30 mmol/L (ref 22–32)
Calcium: 9.2 mg/dL (ref 8.9–10.3)
Chloride: 100 mmol/L (ref 98–111)
Creatinine, Ser: 0.83 mg/dL (ref 0.44–1.00)
GFR, Estimated: >60 mL/min (ref >60)
Glucose, Bld: 97 mg/dL (ref 70–99)
Potassium: 3.9 mmol/L (ref 3.5–5.1)
Sodium: 137 mmol/L (ref 135–145)

## 2021-06-20 LAB — CBC WITH DIFFERENTIAL/PLATELET
Abs Immature Granulocytes: 0.02 10*3/uL (ref 0.00–0.07)
Basophils Absolute: 0 10*3/uL (ref 0.0–0.1)
Basophils Relative: 1 %
Eosinophils Absolute: 0.1 10*3/uL (ref 0.0–0.5)
Eosinophils Relative: 2 %
HCT: 36.3 % (ref 36.0–46.0)
Hemoglobin: 12.5 g/dL (ref 12.0–15.0)
Immature Granulocytes: 0 %
Lymphocytes Relative: 47 %
Lymphs Abs: 3.3 10*3/uL (ref 0.7–4.0)
MCH: 32.6 pg (ref 26.0–34.0)
MCHC: 34.4 g/dL (ref 30.0–36.0)
MCV: 94.8 fL (ref 80.0–100.0)
Monocytes Absolute: 0.5 10*3/uL (ref 0.1–1.0)
Monocytes Relative: 8 %
Neutro Abs: 2.9 10*3/uL (ref 1.7–7.7)
Neutrophils Relative %: 42 %
Platelets: 156 10*3/uL (ref 150–400)
RBC: 3.83 MIL/uL — ABNORMAL LOW (ref 3.87–5.11)
RDW: 14.1 % (ref 11.5–15.5)
WBC: 7 10*3/uL (ref 4.0–10.5)
nRBC: 0 % (ref 0.0–0.2)

## 2021-06-20 MED ORDER — DIPHENHYDRAMINE HCL 25 MG PO CAPS
25.0000 mg | ORAL_CAPSULE | Freq: Once | ORAL | Status: AC
  Administered 2021-06-20: 25 mg via ORAL
  Filled 2021-06-20: qty 1

## 2021-06-20 MED ORDER — DEXAMETHASONE SODIUM PHOSPHATE 100 MG/10ML IJ SOLN
12.0000 mg | Freq: Once | INTRAMUSCULAR | Status: AC
  Administered 2021-06-20: 12 mg via INTRAVENOUS
  Filled 2021-06-20: qty 1.2

## 2021-06-20 MED ORDER — ACETAMINOPHEN 325 MG PO TABS
650.0000 mg | ORAL_TABLET | Freq: Once | ORAL | Status: AC
  Administered 2021-06-20: 650 mg via ORAL
  Filled 2021-06-20: qty 2

## 2021-06-20 MED ORDER — SODIUM CHLORIDE 0.9 % IV SOLN
Freq: Once | INTRAVENOUS | Status: AC
  Filled 2021-06-20: qty 250

## 2021-06-20 MED ORDER — HEPARIN SOD (PORK) LOCK FLUSH 100 UNIT/ML IV SOLN
INTRAVENOUS | Status: AC
  Filled 2021-06-20: qty 5

## 2021-06-20 MED ORDER — SODIUM CHLORIDE 0.9 % IV SOLN
375.0000 mg/m2 | Freq: Once | INTRAVENOUS | Status: AC
  Administered 2021-06-20: 500 mg via INTRAVENOUS
  Filled 2021-06-20: qty 50

## 2021-06-20 NOTE — Patient Instructions (Signed)
[facility]  Discharge Instructions: Thank you for choosing [facility] to provide your oncology and hematology care.  If you have a lab appointment with the [facility], please go directly to the [facility] and check in at the registration area.  Wear comfortable clothing and clothing appropriate for easy access to any Portacath or PICC line.   We strive to give you quality time with your provider. You may need to reschedule your appointment if you arrive late (15 or more minutes).  Arriving late affects you and other patients whose appointments are after yours.  Also, if you miss three or more appointments without notifying the office, you may be dismissed from the clinic at the provider's discretion.      For prescription refill requests, have your pharmacy contact our office and allow 72 hours for refills to be completed.    Today you received the following chemotherapy and/or immunotherapy agents Rituximab        To help prevent nausea and vomiting after your treatment, we encourage you to take your nausea medication as directed.  BELOW ARE SYMPTOMS THAT SHOULD BE REPORTED IMMEDIATELY: *FEVER GREATER THAN 100.4 F (38 C) OR HIGHER *CHILLS OR SWEATING *NAUSEA AND VOMITING THAT IS NOT CONTROLLED WITH YOUR NAUSEA MEDICATION *UNUSUAL SHORTNESS OF BREATH *UNUSUAL BRUISING OR BLEEDING *URINARY PROBLEMS (pain or burning when urinating, or frequent urination) *BOWEL PROBLEMS (unusual diarrhea, constipation, pain near the anus) TENDERNESS IN MOUTH AND THROAT WITH OR WITHOUT PRESENCE OF ULCERS (sore throat, sores in mouth, or a toothache) UNUSUAL RASH, SWELLING OR PAIN  UNUSUAL VAGINAL DISCHARGE OR ITCHING   Items with * indicate a potential emergency and should be followed up as soon as possible or go to the Emergency Department if any problems should occur.  Please show the CHEMOTHERAPY ALERT CARD or IMMUNOTHERAPY ALERT CARD at  check-in to the Emergency Department and triage nurse.  Should you have questions after your visit or need to cancel or reschedule your appointment, please contact [facility]  [phone] and follow the prompts.  Office hours are 8:00 a.m. to 4:30 p.m. Monday - Friday. Please note that voicemails left after 4:00 p.m. may not be returned until the following business day.  We are closed weekends and major holidays. You have access to a nurse at all times for urgent questions. Please call the main number to the clinic [phone] and follow the prompts.  For any non-urgent questions, you may also contact your provider using MyChart. We now offer e-Visits for anyone 2 and older to request care online for non-urgent symptoms. For details visit [email or web address].   Also download the MyChart app! Go to the app store, search "MyChart", open the app, select [facility], and log in with your MyChart username and password.  Due to Covid, a mask is required upon entering the hospital/clinic. If you do not have a mask, one will be given to you upon arrival. For doctor visits, patients may have 1 support person aged 58 or older with them. For treatment visits, patients cannot have anyone with them due to current Covid guidelines and our immunocompromised population.

## 2021-06-20 NOTE — Assessment & Plan Note (Addendum)
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 Status post multiple lines of therapy in the past; PET scan- July 2022- Signs of nodal enlargement, mild nodal enlargement in the chest and abdomen as well as the pelvis with slight increase in general of SUVvalues in the range of Deauville 3/Deauville 4, greatest trend of worsening in the abdomen and pelvis.  Lung nodules-right upper lobe-perifissural suspicious of lymphoma rather than infection/inflammation. Currently on single agent rituxan weekly.  # Proceed with rituximab- cycle #3 today; of planned weekly x4. Tolerating well. Will plan PET scan in end of Jan 2022.   # Mild thrombocytopenia- 144- STABLE.  # Weight loss: 6-7 pounds in 12 months; poor apetite/ Fatigue-chronic lymphoma versus others-monitor for now on - STABLE.  #Infection prophylaxis shingles: currently on acyclovir 400 twice daily x2 more months; until end of DEC 2022: s/p EVUSHELD; proceed with flu shot in 2 weeks.   # DISPOSITION:  # rituxan today # re- schedule the nov ninth appointment to the 10th-; MD labs- cbc/cmp/LDH- Dr.B

## 2021-06-20 NOTE — Progress Notes (Signed)
[name] OFFICE PROGRESS NOTE  Patient Care Team: Jeanne Crouch, MD as PCP - General (Unknown Physician Specialty) Jeanne Hampshire, MD as PCP - Cardiology (Cardiology) Byrnett, Jeanne Gleason, MD (General Surgery) Jeanne Gleason, MD (Oncology) Jeanne Hampshire, MD as Consulting Physician (Cardiology) Jeanne Sickle, MD as Consulting Physician (Hematology and Oncology)  Cancer Staging No matching staging information was found for the patient.    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  Accompanied by son.  Jeanne Pittman 86 y.o.  female pleasant patient above  history of recurrent low-grade lymphoma/B cell non-Hodgkin-currently on weekly rituximab is here for follow-up.  Denies any nausea vomiting.  No fevers or chills.  No chest pain.  No shortness of breath.  No rash.  No infusion reaction.  Review of Systems  Constitutional:  Positive for malaise/fatigue and weight loss. Negative for chills, diaphoresis and fever.  HENT:  Negative for nosebleeds and sore throat.   Eyes:  Negative for double vision.  Respiratory:  Negative for cough, hemoptysis, sputum production, shortness of breath and wheezing.   Cardiovascular:  Negative for chest pain, palpitations, orthopnea and leg swelling.  Gastrointestinal:  Negative for abdominal pain, blood in stool, constipation, diarrhea, heartburn, melena, nausea and vomiting.  Genitourinary:  Negative for dysuria, frequency and urgency.  Musculoskeletal:  Positive for back pain and joint pain.  Skin: Negative.  Negative for itching and rash.  Neurological:  Negative for dizziness, tingling, focal weakness, weakness and headaches.  Endo/Heme/Allergies:  Does not bruise/bleed easily.  Psychiatric/Behavioral:  Negative for depression. The patient is not nervous/anxious and does not have insomnia.    PAST MEDICAL HISTORY :  Past Medical History:  Diagnosis Date   A-fib ([facility])    07/2012: A. fib with RVR in the setting of myocardial infarction. Converted to sinus rhythm with amiodarone. No recurrence.   Anxiety    Atrophic vaginitis    Chronic combined systolic (congestive) and diastolic (congestive) heart failure ([facility])    a. EF was 25-35% post MI but improved to 45-50% in 04/2013; b. 03/2017 Echo: EF 40-45%, mod focal/basal hypertrophy of septum. Sev mid-apicalanteroseptal, ant, and apical HK. Gr1 DD, mild AI/MR, mod TR, PASP 20mHg.  Colon adenomas    Coronary artery disease    a. 14/4818 STEMI complicated by cardiogenic shock. Cath/PCI: LAD 160m (DESx2), RCA 95p(DES). EF 25%; b. 03/2017 MV: EF 57%, fixed apical,  periapical, mid to distal anteroseptal defect. No ischemia.   Eczema    Fibrocystic breast disease    Gastritis    GERD (gastroesophageal reflux disease)    Glaucoma    Headache    Hyperlipidemia    Hypertension    Hypothyroidism    Insomnia    Ischemic cardiomyopathy    a. 07/2012 EF 25-35% following MI-->Improved to 45-50%;  b. 03/2017 Echo: EF 40-45%.   Myocardial infarction ([facility]) 08/18/2014   Non Hodgkin's lymphoma ([place]) 2005   reoccurance 2007 and 2012   Osteoarthritis    Osteoporosis    Polyneuropathy    Polyposis of colon    Restless leg syndrome    Shingles    right   Urinary, incontinence, stress female     PAST SURGICAL HISTORY :   Past Surgical History:  Procedure Laterality Date   ABDOMINAL HYSTERECTOMY  1956   APPENDECTOMY     AXILLARY LYMPH NODE BIOPSY Left 03/18/2015   Procedure: AXILLARY LYMPH NODE BIOPSY;  Surgeon: Jeanne Bellow, MD;  Location: [name];  Service: General;  Laterality: Left;   CARDIAC CATHETERIZATION  08/19/2012   [name]; [facility]   COLONOSCOPY     COLONOSCOPY WITH PROPOFOL N/A 03/02/2016   Procedure: COLONOSCOPY WITH PROPOFOL;  Surgeon: Jeanne Silvas, MD;  Location: [facility] ENDOSCOPY;  Service: Endoscopy;  Laterality: N/A;   COLONOSCOPY WITH PROPOFOL N/A 09/26/2017   Procedure: COLONOSCOPY WITH PROPOFOL;  Surgeon: Jeanne Silvas, MD;  Location: [facility] ENDOSCOPY;  Service: Endoscopy;  Laterality: N/A;   COLONOSCOPY WITH PROPOFOL N/A 03/11/2020   Procedure: COLONOSCOPY WITH PROPOFOL;  Surgeon: Jeanne Sprague, DO;  Location: [name] ENDOSCOPY;  Service: General;  Laterality: N/A;   CORONARY ANGIOPLASTY WITH STENT PLACEMENT     x3 stents   CORONARY ANGIOPLASTY WITH STENT PLACEMENT     CORONARY ARTERY BYPASS GRAFT     ESOPHAGOGASTRODUODENOSCOPY (EGD) WITH PROPOFOL N/A 03/02/2016   Procedure: ESOPHAGOGASTRODUODENOSCOPY (EGD) WITH PROPOFOL;  Surgeon: Jeanne Silvas, MD;  Location: [facility] ENDOSCOPY;  Service: Endoscopy;  Laterality: N/A;    ESOPHAGOGASTRODUODENOSCOPY (EGD) WITH PROPOFOL N/A 04/14/2019   Procedure: ESOPHAGOGASTRODUODENOSCOPY (EGD) WITH PROPOFOL;  Surgeon: Jeanne Pittman, Benay Pike, MD;  Location: [name] ENDOSCOPY;  Service: Gastroenterology;  Laterality: N/A;   ESOPHAGOGASTRODUODENOSCOPY (EGD) WITH PROPOFOL N/A 12/07/2020   Procedure: ESOPHAGOGASTRODUODENOSCOPY (EGD) WITH PROPOFOL;  Surgeon: Lesly Rubenstein, MD;  Location: [name] ENDOSCOPY;  Service: Endoscopy;  Laterality: N/A;   LYMPH NODE BIOPSY Right 07-24-11   Dr Bary Castilla   MOHS SURGERY     bilateral shoulders   PTCA     skin cancer removal     TOTAL ABDOMINAL HYSTERECTOMY W/ BILATERAL SALPINGOOPHORECTOMY      FAMILY HISTORY :   Family History  Problem Relation Age of Onset   Heart attack Father    Heart disease Brother    Leukemia Mother    Bladder Cancer Neg Hx    Prostate cancer Neg Hx    Kidney cancer Neg Hx    Breast cancer Neg Hx     SOCIAL HISTORY:   Social History   Tobacco Use   Smoking status: Former    Types: Cigarettes    Quit date: 1950    Years since quitting: 72.8   Smokeless tobacco: Never  Vaping Use   Vaping Use:  Never used  Substance Use Topics   Alcohol use: No    Alcohol/week: 0.0 standard drinks   Drug use: No    ALLERGIES:  is allergic to ace inhibitors, benadryl [diphenhydramine hcl], codeine, diphenhydramine, guaiacol, hydrocodone, cardizem [diltiazem], and guaifenesin & derivatives.  MEDICATIONS:  Current Outpatient Medications  Medication Sig Dispense Refill   acetaminophen (TYLENOL) 650 MG CR tablet Take 650 mg by mouth every 8 (eight) hours as needed for pain.     acyclovir (ZOVIRAX) 400 MG tablet Take 1 tablet (400 mg total) by mouth 2 (two) times daily. [to prevent shingles] 60 tablet 3   aspirin 81 MG tablet Take 81 mg by mouth every morning.      carvedilol (COREG) 6.25 MG tablet TAKE 1 TABLET(6.25 MG) BY MOUTH TWICE DAILY WITH A MEAL 180 tablet 1   Cholecalciferol (VITAMIN D-3) 5000 UNITS TABS Take 2,000 Int'l  Units by mouth every morning.      DULoxetine (CYMBALTA) 20 MG capsule Take 20 mg by mouth daily.     latanoprost (XALATAN) 0.005 % ophthalmic solution Place 1 drop into both eyes at bedtime.     LORazepam (ATIVAN) 0.5 MG tablet Take 0.5 mg by mouth every 8 (eight) hours as needed for anxiety.     losartan (COZAAR) 25 MG tablet TAKE 1/2 TABLET(12.5 MG) BY MOUTH DAILY AFTER BREAKFAST 45 tablet 3   pantoprazole (PROTONIX) 40 MG tablet TAKE 1 TABLET(40 MG) BY MOUTH TWICE DAILY 180 tablet 0   rosuvastatin (CRESTOR) 10 MG tablet TAKE 1 TABLET(10 MG) BY MOUTH EVERY OTHER DAY 45 tablet 0   timolol (TIMOPTIC) 0.5 % ophthalmic solution Place 1 drop into both eyes 2 (two) times daily.     fexofenadine (ALLEGRA) 180 MG tablet Take 180 mg by mouth daily as needed for rhinitis.  (Patient not taking: No sig reported)     ibuprofen (ADVIL) 800 MG tablet Take 800 mg by mouth every 6 (six) hours as needed. (Patient not taking: No sig reported)     No current facility-administered medications for this visit.   Facility-Administered Medications Ordered in Other Visits  Medication Dose Route Frequency Provider Last Rate Last Admin   acetaminophen (TYLENOL) tablet 650 mg  650 mg Oral Once Jeanne Sickle, MD       dexamethasone (DECADRON) 12 mg in sodium chloride 0.9 % 50 mL IVPB  12 mg Intravenous Once Jeanne Sickle, MD       diphenhydrAMINE (BENADRYL) capsule 25 mg  25 mg Oral Once Jeanne Sickle, MD       riTUXimab-pvvr (RUXIENCE) 500 mg in sodium chloride 0.9 % 200 mL infusion  375 mg/m2 (Treatment Plan Recorded) Intravenous Once Jeanne Sickle, MD        PHYSICAL EXAMINATION: ECOG PERFORMANCE STATUS: 0 - Asymptomatic  BP (!) 142/60   Pulse 60   Temp 97.8 F (36.6 C)   Resp 20   Wt 92 lb 6.4 oz (41.9 kg)   SpO2 100%   BMI 17.46 kg/m   Filed Weights   06/20/21 0826  Weight: 92 lb 6.4 oz (41.9 kg)     Physical Exam Constitutional:      Comments: She is accompanied  by her son.  Ambulating independently.  HENT:     Head: Normocephalic and atraumatic.     Mouth/Throat:     Pharynx: No oropharyngeal exudate.  Eyes:     Pupils: Pupils are equal, round, and reactive to light.  Neck:  Comments: Bilateral neck/axillary adenopathy. Cardiovascular:     Rate and Rhythm: Normal rate and regular rhythm.  Pulmonary:     Effort: No respiratory distress.     Breath sounds: No wheezing.  Abdominal:     General: Bowel sounds are normal. There is no distension.     Palpations: Abdomen is soft. There is no mass.     Tenderness: There is no abdominal tenderness. There is no guarding or rebound.  Musculoskeletal:        General: No tenderness. Normal range of motion.     Cervical back: Normal range of motion and neck supple.  Lymphadenopathy:     Comments: Bilateral axillary adenopathy 1-2 cm in size.   Skin:    General: Skin is warm.  Neurological:     Mental Status: She is alert and oriented to person, place, and time.  Psychiatric:        Mood and Affect: Affect normal.     LABORATORY DATA:  I have reviewed the data as listed    Component Value Date/Time   NA 137 06/20/2021 0811   NA 141 09/07/2014 1449   K 3.9 06/20/2021 0811   K 4.0 09/07/2014 1449   CL 100 06/20/2021 0811   CL 106 09/07/2014 1449   CO2 30 06/20/2021 0811   CO2 29 09/07/2014 1449   GLUCOSE 97 06/20/2021 0811   GLUCOSE 109 (H) 09/07/2014 1449   BUN 19 06/20/2021 0811   BUN 19 (H) 09/07/2014 1449   CREATININE 0.83 06/20/2021 0811   CREATININE 1.01 09/07/2014 1449   CALCIUM 9.2 06/20/2021 0811   CALCIUM 8.8 09/07/2014 1449   PROT 7.0 06/13/2021 0856   PROT 6.3 09/02/2015 0824   PROT 6.5 09/07/2014 1449   ALBUMIN 4.0 06/13/2021 0856   ALBUMIN 4.2 09/02/2015 0824   ALBUMIN 3.8 09/07/2014 1449   AST 14 (L) 06/13/2021 0856   AST 11 (L) 09/07/2014 1449   ALT 12 06/13/2021 0856   ALT 22 09/07/2014 1449   ALKPHOS 49 06/13/2021 0856   ALKPHOS 75 09/07/2014 1449   BILITOT  0.8 06/13/2021 0856   BILITOT 0.4 09/02/2015 0824   BILITOT 0.8 09/07/2014 1449   GFRNONAA >60 06/20/2021 0811   GFRNONAA 56 (L) 09/07/2014 1449   GFRNONAA 44 (L) 02/09/2014 1351   GFRAA 59 (L) 02/27/2020 1023   GFRAA >60 09/07/2014 1449   GFRAA 51 (L) 02/09/2014 1351    No results found for: SPEP, UPEP  Lab Results  Component Value Date   WBC 7.0 06/20/2021   NEUTROABS 2.9 06/20/2021   HGB 12.5 06/20/2021   HCT 36.3 06/20/2021   MCV 94.8 06/20/2021   PLT 156 06/20/2021      Chemistry      Component Value Date/Time   NA 137 06/20/2021 0811   NA 141 09/07/2014 1449   K 3.9 06/20/2021 0811   K 4.0 09/07/2014 1449   CL 100 06/20/2021 0811   CL 106 09/07/2014 1449   CO2 30 06/20/2021 0811   CO2 29 09/07/2014 1449   BUN 19 06/20/2021 0811   BUN 19 (H) 09/07/2014 1449   CREATININE 0.83 06/20/2021 0811   CREATININE 1.01 09/07/2014 1449      Component Value Date/Time   CALCIUM 9.2 06/20/2021 0811   CALCIUM 8.8 09/07/2014 1449   ALKPHOS 49 06/13/2021 0856   ALKPHOS 75 09/07/2014 1449   AST 14 (L) 06/13/2021 0856   AST 11 (L) 09/07/2014 1449   ALT 12 06/13/2021 0856  ALT 22 09/07/2014 1449   BILITOT 0.8 06/13/2021 0856   BILITOT 0.4 09/02/2015 0824   BILITOT 0.8 09/07/2014 1449      RADIOGRAPHIC STUDIES: I have personally reviewed the radiological images as listed and agreed with the findings in the report. No results found.   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 Status post multiple lines of therapy in the past; PET scan- July 2022- Signs of nodal enlargement, mild nodal enlargement in the chest and abdomen as well as the pelvis with slight increase in general of SUVvalues in the range of Deauville 3/Deauville 4, greatest trend of worsening in the abdomen and pelvis.  Lung nodules-right upper lobe-perifissural suspicious of lymphoma  rather than infection/inflammation. Currently on single agent rituxan weekly.  # Proceed with rituximab- cycle #3 today; of planned weekly x4. Tolerating well. Will plan PET scan in end of Jan 2022.   # Mild thrombocytopenia- 144- STABLE.  # Weight loss: 6-7 pounds in 12 months; poor apetite/ Fatigue-chronic lymphoma versus others-monitor for now on - STABLE.   #Infection prophylaxis shingles: currently on acyclovir 400 twice daily x2 [place] months; until end of DEC 2022: s/p EVUSHELD; proceed with flu shot in 2 weeks.   # DISPOSITION:  # rituxan today # re- schedule the nov ninth appointment to the 10th-; MD labs- cbc/cmp/LDH- Dr.B     Orders Placed This Encounter  Procedures   CBC with Differential    Standing Status:   Future    Standing Expiration Date:   06/20/2022   Comprehensive metabolic panel    Standing Status:   Future    Standing Expiration Date:   06/20/2022   Lactate dehydrogenase    Standing Status:   Future    Standing Expiration Date:   06/20/2022     All questions were answered. The patient knows to call the clinic with any problems, questions or concerns.      Jeanne Sickle, MD 06/20/2021 9:25 AM

## 2021-06-24 ENCOUNTER — Ambulatory Visit: Payer: Medicare Other

## 2021-06-26 ENCOUNTER — Other Ambulatory Visit: Payer: Self-pay | Admitting: Cardiovascular Disease

## 2021-06-29 ENCOUNTER — Ambulatory Visit: Payer: Medicare Other | Admitting: Internal Medicine

## 2021-06-29 ENCOUNTER — Other Ambulatory Visit: Payer: Medicare Other

## 2021-06-30 ENCOUNTER — Inpatient Hospital Stay (HOSPITAL_BASED_OUTPATIENT_CLINIC_OR_DEPARTMENT_OTHER): Payer: Medicare Other | Admitting: Internal Medicine

## 2021-06-30 ENCOUNTER — Inpatient Hospital Stay: Payer: Medicare Other | Attending: Internal Medicine

## 2021-06-30 ENCOUNTER — Other Ambulatory Visit: Payer: Self-pay

## 2021-06-30 ENCOUNTER — Encounter: Payer: Self-pay | Admitting: Internal Medicine

## 2021-06-30 ENCOUNTER — Other Ambulatory Visit: Payer: Self-pay | Admitting: *Deleted

## 2021-06-30 DIAGNOSIS — D696 Thrombocytopenia, unspecified: Secondary | ICD-10-CM | POA: Insufficient documentation

## 2021-06-30 DIAGNOSIS — R918 Other nonspecific abnormal finding of lung field: Secondary | ICD-10-CM | POA: Insufficient documentation

## 2021-06-30 DIAGNOSIS — Z806 Family history of leukemia: Secondary | ICD-10-CM | POA: Diagnosis not present

## 2021-06-30 DIAGNOSIS — Z87891 Personal history of nicotine dependence: Secondary | ICD-10-CM | POA: Diagnosis not present

## 2021-06-30 DIAGNOSIS — Z9049 Acquired absence of other specified parts of digestive tract: Secondary | ICD-10-CM | POA: Insufficient documentation

## 2021-06-30 DIAGNOSIS — I2510 Atherosclerotic heart disease of native coronary artery without angina pectoris: Secondary | ICD-10-CM | POA: Insufficient documentation

## 2021-06-30 DIAGNOSIS — C8304 Small cell B-cell lymphoma, lymph nodes of axilla and upper limb: Secondary | ICD-10-CM | POA: Diagnosis not present

## 2021-06-30 DIAGNOSIS — Z79899 Other long term (current) drug therapy: Secondary | ICD-10-CM | POA: Diagnosis not present

## 2021-06-30 DIAGNOSIS — Z888 Allergy status to other drugs, medicaments and biological substances status: Secondary | ICD-10-CM | POA: Diagnosis not present

## 2021-06-30 DIAGNOSIS — I4891 Unspecified atrial fibrillation: Secondary | ICD-10-CM | POA: Insufficient documentation

## 2021-06-30 DIAGNOSIS — Z86010 Personal history of colonic polyps: Secondary | ICD-10-CM | POA: Diagnosis not present

## 2021-06-30 DIAGNOSIS — Z8719 Personal history of other diseases of the digestive system: Secondary | ICD-10-CM | POA: Insufficient documentation

## 2021-06-30 DIAGNOSIS — M549 Dorsalgia, unspecified: Secondary | ICD-10-CM | POA: Diagnosis not present

## 2021-06-30 DIAGNOSIS — I252 Old myocardial infarction: Secondary | ICD-10-CM | POA: Diagnosis not present

## 2021-06-30 DIAGNOSIS — I5042 Chronic combined systolic (congestive) and diastolic (congestive) heart failure: Secondary | ICD-10-CM | POA: Diagnosis not present

## 2021-06-30 DIAGNOSIS — M2550 Pain in unspecified joint: Secondary | ICD-10-CM | POA: Diagnosis not present

## 2021-06-30 DIAGNOSIS — Z90722 Acquired absence of ovaries, bilateral: Secondary | ICD-10-CM | POA: Insufficient documentation

## 2021-06-30 DIAGNOSIS — Z8249 Family history of ischemic heart disease and other diseases of the circulatory system: Secondary | ICD-10-CM | POA: Diagnosis not present

## 2021-06-30 DIAGNOSIS — C8308 Small cell B-cell lymphoma, lymph nodes of multiple sites: Secondary | ICD-10-CM | POA: Diagnosis not present

## 2021-06-30 DIAGNOSIS — Z885 Allergy status to narcotic agent status: Secondary | ICD-10-CM | POA: Insufficient documentation

## 2021-06-30 DIAGNOSIS — R5383 Other fatigue: Secondary | ICD-10-CM | POA: Insufficient documentation

## 2021-06-30 DIAGNOSIS — Z8572 Personal history of non-Hodgkin lymphomas: Secondary | ICD-10-CM | POA: Insufficient documentation

## 2021-06-30 LAB — COMPREHENSIVE METABOLIC PANEL
ALT: 15 U/L (ref 0–44)
AST: 14 U/L — ABNORMAL LOW (ref 15–41)
Albumin: 4 g/dL (ref 3.5–5.0)
Alkaline Phosphatase: 58 U/L (ref 38–126)
Anion gap: 8 (ref 5–15)
BUN: 28 mg/dL — ABNORMAL HIGH (ref 8–23)
CO2: 29 mmol/L (ref 22–32)
Calcium: 9.4 mg/dL (ref 8.9–10.3)
Chloride: 99 mmol/L (ref 98–111)
Creatinine, Ser: 0.91 mg/dL (ref 0.44–1.00)
GFR, Estimated: >60 mL/min (ref >60)
Glucose, Bld: 105 mg/dL — ABNORMAL HIGH (ref 70–99)
Potassium: 4.2 mmol/L (ref 3.5–5.1)
Sodium: 136 mmol/L (ref 135–145)
Total Bilirubin: 0.8 mg/dL (ref 0.3–1.2)
Total Protein: 7 g/dL (ref 6.5–8.1)

## 2021-06-30 LAB — CBC WITH DIFFERENTIAL/PLATELET
Abs Immature Granulocytes: 0.06 10*3/uL (ref 0.00–0.07)
Basophils Absolute: 0.1 10*3/uL (ref 0.0–0.1)
Basophils Relative: 1 %
Eosinophils Absolute: 0.1 10*3/uL (ref 0.0–0.5)
Eosinophils Relative: 1 %
HCT: 36.8 % (ref 36.0–46.0)
Hemoglobin: 12.6 g/dL (ref 12.0–15.0)
Immature Granulocytes: 1 %
Lymphocytes Relative: 53 %
Lymphs Abs: 3.7 10*3/uL (ref 0.7–4.0)
MCH: 32.2 pg (ref 26.0–34.0)
MCHC: 34.2 g/dL (ref 30.0–36.0)
MCV: 94.1 fL (ref 80.0–100.0)
Monocytes Absolute: 0.5 10*3/uL (ref 0.1–1.0)
Monocytes Relative: 8 %
Neutro Abs: 2.6 10*3/uL (ref 1.7–7.7)
Neutrophils Relative %: 36 %
Platelets: 148 10*3/uL — ABNORMAL LOW (ref 150–400)
RBC: 3.91 MIL/uL (ref 3.87–5.11)
RDW: 14.1 % (ref 11.5–15.5)
WBC: 7 10*3/uL (ref 4.0–10.5)
nRBC: 0 % (ref 0.0–0.2)

## 2021-06-30 LAB — LACTATE DEHYDROGENASE: LDH: 103 U/L (ref 98–192)

## 2021-06-30 NOTE — Progress Notes (Signed)
[name] OFFICE PROGRESS NOTE  Patient Care Team: [name], MD as PCP - General (Unknown Physician Specialty) Wellington Hampshire, MD as PCP - Cardiology (Cardiology) Byrnett, Forest Gleason, MD (General Surgery) Forest Gleason, MD (Oncology) Wellington Hampshire, MD as Consulting Physician (Cardiology) Cammie Sickle, MD as Consulting Physician (Hematology and Oncology)  Cancer Staging No matching staging information was found for the patient.    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  Accompanied by son.  Jeanne Pittman 86 y.o.  female pleasant patient above  history of recurrent low-grade lymphoma/B cell non-Hodgkin-currently s/p weekly rituximab is here for follow-up.  Patient denies any infusion reactions.  No rash.  No fever no chills.  Poor appetite.    Review of Systems  Constitutional:  Positive for malaise/fatigue and weight loss. Negative for chills, diaphoresis and fever.  HENT:  Negative for nosebleeds and sore throat.   Eyes:  Negative for double vision.  Respiratory:  Negative for cough, hemoptysis, sputum production, shortness of breath and wheezing.   Cardiovascular:  Negative for chest pain, palpitations, orthopnea and leg swelling.  Gastrointestinal:  Negative for abdominal pain, blood in stool, constipation, diarrhea, heartburn, melena, nausea and vomiting.  Genitourinary:  Negative for dysuria, frequency and urgency.  Musculoskeletal:  Positive for back pain and joint pain.  Skin: Negative.  Negative for itching and rash.  Neurological:  Negative for dizziness, tingling, focal weakness, weakness and headaches.  Endo/Heme/Allergies:  Does not bruise/bleed easily.  Psychiatric/Behavioral:  Negative for depression. The patient is not nervous/anxious and does not have insomnia.    PAST MEDICAL HISTORY :  Past Medical History:  Diagnosis Date  . A-fib ([place])    07/2012: A. fib with RVR in the setting of myocardial infarction. Converted to sinus rhythm with amiodarone. No recurrence.  . Anxiety   . Atrophic vaginitis   . Chronic combined systolic (congestive) and diastolic (congestive) heart failure ([place])    a. EF was 25-35% post MI but improved to 45-50% in 04/2013; b. 03/2017 Echo: EF 40-45%, mod focal/basal hypertrophy of septum. Sev mid-apicalanteroseptal, ant, and apical HK. Gr1 DD, mild AI/MR, mod TR, PASP 82mHg.  . Colon adenomas   . Coronary artery  disease    a. 43/1540 STEMI complicated by cardiogenic shock. Cath/PCI: LAD 139m (DESx2), RCA 95p(DES). EF 25%; b. 03/2017 MV: EF 57%, fixed apical, periapical, mid to distal  anteroseptal defect. No ischemia.  . Eczema   . Fibrocystic breast disease   . Gastritis   . GERD (gastroesophageal reflux disease)   . Glaucoma   . Headache   . Hyperlipidemia   . Hypertension   . Hypothyroidism   . Insomnia   . Ischemic cardiomyopathy    a. 07/2012 EF 25-35% following MI-->Improved to 45-50%;  b. 03/2017 Echo: EF 40-45%.  . Myocardial infarction ([place]) 08/18/2014  . Non Hodgkin's lymphoma ([place]) 2005   reoccurance 2007 and 2012  . Osteoarthritis   . Osteoporosis   . Polyneuropathy   . Polyposis of colon   . Restless leg syndrome   . Shingles    right  . Urinary, incontinence, stress female     PAST SURGICAL HISTORY :   Past Surgical History:  Procedure Laterality Date  . ABDOMINAL HYSTERECTOMY  1956  . APPENDECTOMY    . AXILLARY LYMPH NODE BIOPSY Left 03/18/2015   Procedure: AXILLARY LYMPH NODE BIOPSY;  Surgeon: [name], MD;  Location: [name];  Service: General;  Laterality: Left;  . CARDIAC CATHETERIZATION  08/19/2012   [name]; [place]  . COLONOSCOPY    . COLONOSCOPY WITH PROPOFOL N/A 03/02/2016   Procedure: COLONOSCOPY WITH PROPOFOL;  Surgeon: Manya Silvas, MD;  Location: [facility] ENDOSCOPY;  Service: Endoscopy;  Laterality: N/A;  . COLONOSCOPY WITH PROPOFOL N/A 09/26/2017   Procedure: COLONOSCOPY WITH PROPOFOL;  Surgeon: Manya Silvas, MD;  Location: [facility] ENDOSCOPY;  Service: Endoscopy;  Laterality: N/A;  . COLONOSCOPY WITH PROPOFOL N/A 03/11/2020   Procedure: COLONOSCOPY WITH PROPOFOL;  Surgeon: Benjamine Sprague, DO;  Location: [name] ENDOSCOPY;  Service: General;  Laterality: N/A;  . CORONARY ANGIOPLASTY WITH STENT PLACEMENT     x3 stents  . CORONARY ANGIOPLASTY WITH STENT PLACEMENT    . CORONARY ARTERY BYPASS GRAFT    . ESOPHAGOGASTRODUODENOSCOPY (EGD) WITH PROPOFOL N/A 03/02/2016   Procedure: ESOPHAGOGASTRODUODENOSCOPY (EGD) WITH PROPOFOL;  Surgeon: Manya Silvas, MD;  Location: [facility] ENDOSCOPY;  Service: Endoscopy;  Laterality: N/A;  .  ESOPHAGOGASTRODUODENOSCOPY (EGD) WITH PROPOFOL N/A 04/14/2019   Procedure: ESOPHAGOGASTRODUODENOSCOPY (EGD) WITH PROPOFOL;  Surgeon: Toledo, Benay Pike, MD;  Location: [name] ENDOSCOPY;  Service: Gastroenterology;  Laterality: N/A;  . ESOPHAGOGASTRODUODENOSCOPY (EGD) WITH PROPOFOL N/A 12/07/2020   Procedure: ESOPHAGOGASTRODUODENOSCOPY (EGD) WITH PROPOFOL;  Surgeon: Lesly Rubenstein, MD;  Location: [name] ENDOSCOPY;  Service: Endoscopy;  Laterality: N/A;  . LYMPH NODE BIOPSY Right 07-24-11   Dr Bary Castilla  . MOHS SURGERY     bilateral shoulders  . PTCA    . skin cancer removal    . TOTAL ABDOMINAL HYSTERECTOMY W/ BILATERAL SALPINGOOPHORECTOMY      FAMILY HISTORY :   Family History  Problem Relation Age of Onset  . Heart attack Father   . Heart disease Brother   . Leukemia Mother   . Bladder Cancer Neg Hx   . Prostate cancer Neg Hx   . Kidney cancer Neg Hx   . Breast cancer Neg Hx     SOCIAL HISTORY:   Social History   Tobacco Use  . Smoking status: Former    Types: Cigarettes    Quit date: 1950    Years since quitting: 72.9  . Smokeless tobacco: Never  Vaping Use  . Vaping Use: Never used  Substance Use Topics  .  Alcohol use: No    Alcohol/week: 0.0 standard drinks  . Drug use: No    ALLERGIES:  is allergic to ace inhibitors, benadryl [diphenhydramine hcl], codeine, diphenhydramine, guaiacol, hydrocodone, cardizem [diltiazem], and guaifenesin & derivatives.  MEDICATIONS:  Current Outpatient Medications  Medication Sig Dispense Refill  . acetaminophen (TYLENOL) 650 MG CR tablet Take 650 mg by mouth every 8 (eight) hours as needed for pain.    [name] acyclovir (ZOVIRAX) 400 MG tablet Take 1 tablet (400 mg total) by mouth 2 (two) times daily. [to prevent shingles] 60 tablet 3  . aspirin 81 MG tablet Take 81 mg by mouth every morning.     . carvedilol (COREG) 6.25 MG tablet TAKE 1 TABLET(6.25 MG) BY MOUTH TWICE DAILY WITH A MEAL 180 tablet 1  . Cholecalciferol (VITAMIN D-3) 5000  UNITS TABS Take 2,000 Int'l Units by mouth every morning.     . DULoxetine (CYMBALTA) 20 MG capsule Take 20 mg by mouth daily.    [name] latanoprost (XALATAN) 0.005 % ophthalmic solution Place 1 drop into both eyes at bedtime.    [name] LORazepam (ATIVAN) 0.5 MG tablet Take 0.5 mg by mouth every 8 (eight) hours as needed for anxiety.    [name] losartan (COZAAR) 25 MG tablet TAKE 1/2 TABLET(12.5 MG) BY MOUTH DAILY AFTER BREAKFAST 45 tablet 3  . pantoprazole (PROTONIX) 40 MG tablet TAKE 1 TABLET(40 MG) BY MOUTH TWICE DAILY 180 tablet 0  . rosuvastatin (CRESTOR) 10 MG tablet TAKE 1 TABLET(10 MG) BY MOUTH EVERY OTHER DAY 45 tablet 0  . timolol (TIMOPTIC) 0.5 % ophthalmic solution Place 1 drop into both eyes 2 (two) times daily.    . fexofenadine (ALLEGRA) 180 MG tablet Take 180 mg by mouth daily as needed for rhinitis.  (Patient not taking: No sig reported)    . ibuprofen (ADVIL) 800 MG tablet Take 800 mg by mouth every 6 (six) hours as needed. (Patient not taking: No sig reported)     No current facility-administered medications for this visit.    PHYSICAL EXAMINATION: ECOG PERFORMANCE STATUS: 0 - Asymptomatic  BP (!) 145/55 (BP Location: Right Arm, Patient Position: Sitting, Cuff Size: Normal)   Pulse (!) 59   Temp (!) 97 F (36.1 C) (Tympanic)   Resp 16   Wt 94 lb 11.2 oz (43 kg)   SpO2 100%   BMI 17.89 kg/m   Filed Weights   06/30/21 0831  Weight: 94 lb 11.2 oz (43 kg)     Physical Exam Constitutional:      Comments: She is accompanied by her son.  Ambulating independently.  HENT:     Head: Normocephalic and atraumatic.     Mouth/Throat:     Pharynx: No oropharyngeal exudate.  Eyes:     Pupils: Pupils are equal, round, and reactive to light.  Neck:     Comments: Bilateral neck/axillary adenopathy. Cardiovascular:     Rate and Rhythm: Normal rate and regular rhythm.  Pulmonary:     Effort: No respiratory distress.     Breath sounds: No wheezing.  Abdominal:     General: Bowel sounds  are normal. There is no distension.     Palpations: Abdomen is soft. There is no mass.     Tenderness: There is no abdominal tenderness. There is no guarding or rebound.  Musculoskeletal:        General: No tenderness. Normal range of motion.     Cervical back: Normal range of motion and neck supple.  Lymphadenopathy:  Comments: Bilateral axillary adenopathy 1-2 cm in size.   Skin:    General: Skin is warm.  Neurological:     Mental Status: She is alert and oriented to person, place, and time.  Psychiatric:        Mood and Affect: Affect normal.     LABORATORY DATA:  I have reviewed the data as listed    Component Value Date/Time   NA 136 06/30/2021 0808   NA 141 09/07/2014 1449   K 4.2 06/30/2021 0808   K 4.0 09/07/2014 1449   CL 99 06/30/2021 0808   CL 106 09/07/2014 1449   CO2 29 06/30/2021 0808   CO2 29 09/07/2014 1449   GLUCOSE 105 (H) 06/30/2021 0808   GLUCOSE 109 (H) 09/07/2014 1449   BUN 28 (H) 06/30/2021 0808   BUN 19 (H) 09/07/2014 1449   CREATININE 0.91 06/30/2021 0808   CREATININE 1.01 09/07/2014 1449   CALCIUM 9.4 06/30/2021 0808   CALCIUM 8.8 09/07/2014 1449   PROT 7.0 06/30/2021 0808   PROT 6.3 09/02/2015 0824   PROT 6.5 09/07/2014 1449   ALBUMIN 4.0 06/30/2021 0808   ALBUMIN 4.2 09/02/2015 0824   ALBUMIN 3.8 09/07/2014 1449   AST 14 (L) 06/30/2021 0808   AST 11 (L) 09/07/2014 1449   ALT 15 06/30/2021 0808   ALT 22 09/07/2014 1449   ALKPHOS 58 06/30/2021 0808   ALKPHOS 75 09/07/2014 1449   BILITOT 0.8 06/30/2021 0808   BILITOT 0.4 09/02/2015 0824   BILITOT 0.8 09/07/2014 1449   GFRNONAA >60 06/30/2021 0808   GFRNONAA 56 (L) 09/07/2014 1449   GFRNONAA 44 (L) 02/09/2014 1351   GFRAA 59 (L) 02/27/2020 1023   GFRAA >60 09/07/2014 1449   GFRAA 51 (L) 02/09/2014 1351    No results found for: SPEP, UPEP  Lab Results  Component Value Date   WBC 7.0 06/30/2021   NEUTROABS 2.6 06/30/2021   HGB 12.6 06/30/2021   HCT 36.8 06/30/2021   MCV 94.1  06/30/2021   PLT 148 (L) 06/30/2021      Chemistry      Component Value Date/Time   NA 136 06/30/2021 0808   NA 141 09/07/2014 1449   K 4.2 06/30/2021 0808   K 4.0 09/07/2014 1449   CL 99 06/30/2021 0808   CL 106 09/07/2014 1449   CO2 29 06/30/2021 0808   CO2 29 09/07/2014 1449   BUN 28 (H) 06/30/2021 0808   BUN 19 (H) 09/07/2014 1449   CREATININE 0.91 06/30/2021 0808   CREATININE 1.01 09/07/2014 1449      Component Value Date/Time   CALCIUM 9.4 06/30/2021 0808   CALCIUM 8.8 09/07/2014 1449   ALKPHOS 58 06/30/2021 0808   ALKPHOS 75 09/07/2014 1449   AST 14 (L) 06/30/2021 0808   AST 11 (L) 09/07/2014 1449   ALT 15 06/30/2021 0808   ALT 22 09/07/2014 1449   BILITOT 0.8 06/30/2021 0808   BILITOT 0.4 09/02/2015 0824   BILITOT 0.8 09/07/2014 1449      RADIOGRAPHIC STUDIES: I have personally reviewed the radiological images as listed and agreed with the findings in the report. No results found.   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 Status post multiple lines of therapy in the past; PET scan- July 2022- Signs of nodal enlargement, mild nodal enlargement in the chest and abdomen as well as the pelvis with slight increase in  general of SUVvalues in the range of Deauville 3/Deauville 4, greatest trend of worsening in the abdomen and pelvis.  Lung nodules-right upper lobe-perifissural suspicious of lymphoma rather than infection/inflammation.   # Currently on single agent rituxan weekly x4- will repeat PET scan in end of Jan 2023.  # Mild thrombocytopenia- 148- STABLE.  # Weight loss: 6-7 pounds in 12 months; poor apetite/ Fatigue-chronic lymphoma versus others-monitor for now on - STABLE; await above PET scan.   #Infection prophylaxis shingles: currently on acyclovir 400 twice daily x2 more months; until end of DEC 2022: s/p EVUSHELD; proceed  with flu shot in 2 weeks.   # DISPOSITION:  # Follow up 1st week of FEB- MD labs- cbc/cmp/LDH; PET scan prior Dr.B     Orders Placed This Encounter  Procedures  . NM PET Image Restage (PS) Skull Base to Thigh (F-18 FDG)    Standing Status:   Future    Standing Expiration Date:   06/30/2022    Order Specific Question:   If indicated for the ordered procedure, I authorize the administration of a radiopharmaceutical per Radiology protocol    Answer:   Yes    Order Specific Question:   Preferred imaging location?    Answer:   [facility]     All questions were answered. The patient knows to call the clinic with any problems, questions or concerns.      Cammie Sickle, MD 06/30/2021 8:52 AM

## 2021-06-30 NOTE — Assessment & Plan Note (Addendum)
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 Status post multiple lines of therapy in the past; PET scan- July 2022- Signs of nodal enlargement, mild nodal enlargement in the chest and abdomen as well as the pelvis with slight increase in general of SUVvalues in the range of Deauville 3/Deauville 4, greatest trend of worsening in the abdomen and pelvis.  Lung nodules-right upper lobe-perifissural suspicious of lymphoma rather than infection/inflammation.   # Currently on single agent rituxan weekly x4- will repeat PET scan in end of Jan 2023.  # Mild thrombocytopenia- 148- STABLE.  # Weight loss: 6-7 pounds in 12 months; poor apetite/ Fatigue-chronic lymphoma versus others-monitor for now on - STABLE; await above PET scan.   #Infection prophylaxis shingles: currently on acyclovir 400 twice daily x2 more months; until end of DEC 2022: s/p EVUSHELD; proceed with flu shot in 2 weeks.   # DISPOSITION:  # Follow up 1st week of FEB- MD labs- cbc/cmp/LDH; PET scan prior Dr.B

## 2021-07-28 ENCOUNTER — Telehealth: Payer: Self-pay | Admitting: *Deleted

## 2021-07-28 NOTE — Telephone Encounter (Signed)
Patient called stating htat she has been on Acyclovir since October for Shingles she had . She reports that she does still have a few areas that have not yet healed. She is stating that it is time for a refill and is asking if she needs to continue taking it or if she should stop it. She will need a prescription sent in to pharmacy if she is to continue taking it. Please advise

## 2021-07-29 ENCOUNTER — Encounter: Payer: Self-pay | Admitting: Internal Medicine

## 2021-07-29 MED ORDER — ACYCLOVIR 400 MG PO TABS: 400.0000 mg | ORAL_TABLET | Freq: Two times a day (BID) | ORAL | 1 refills | Status: DC

## 2021-09-21 ENCOUNTER — Ambulatory Visit
Admission: RE | Admit: 2021-09-21 | Discharge: 2021-09-21 | Disposition: A | Discharge status: Home / Self Care | Payer: Medicare Other | Source: Ambulatory Visit | Attending: Internal Medicine | Admitting: Internal Medicine

## 2021-09-21 DIAGNOSIS — K5730 Diverticulosis of large intestine without perforation or abscess without bleeding: Secondary | ICD-10-CM | POA: Diagnosis not present

## 2021-09-21 DIAGNOSIS — I700 Atherosclerosis of aorta: Secondary | ICD-10-CM | POA: Insufficient documentation

## 2021-09-21 DIAGNOSIS — C8304 Small cell B-cell lymphoma, lymph nodes of axilla and upper limb: Secondary | ICD-10-CM | POA: Insufficient documentation

## 2021-09-21 DIAGNOSIS — I2510 Atherosclerotic heart disease of native coronary artery without angina pectoris: Secondary | ICD-10-CM | POA: Diagnosis not present

## 2021-09-21 DIAGNOSIS — R590 Localized enlarged lymph nodes: Secondary | ICD-10-CM | POA: Insufficient documentation

## 2021-09-21 LAB — GLUCOSE, CAPILLARY: Glucose-Capillary: 79 mg/dL (ref 70–99)

## 2021-09-21 MED ORDER — FLUDEOXYGLUCOSE F - 18 (FDG) INJECTION
5.2000 | Freq: Once | INTRAVENOUS | Status: AC | PRN
  Administered 2021-09-21: 5.4 via INTRAVENOUS

## 2021-09-26 NOTE — Progress Notes (Signed)
I spoke to patients son regarding the results of the PET scan. Follow up as planned.

## 2021-09-29 ENCOUNTER — Inpatient Hospital Stay: Payer: Medicare Other | Attending: Internal Medicine

## 2021-09-29 ENCOUNTER — Encounter: Payer: Self-pay | Admitting: Internal Medicine

## 2021-09-29 ENCOUNTER — Inpatient Hospital Stay (HOSPITAL_BASED_OUTPATIENT_CLINIC_OR_DEPARTMENT_OTHER): Payer: Medicare Other | Admitting: Internal Medicine

## 2021-09-29 ENCOUNTER — Other Ambulatory Visit: Payer: Self-pay

## 2021-09-29 VITALS — BP 146/82 | HR 65 | Temp 96.8°F | Ht 61.0 in | Wt 96.2 lb

## 2021-09-29 DIAGNOSIS — C8304 Small cell B-cell lymphoma, lymph nodes of axilla and upper limb: Secondary | ICD-10-CM

## 2021-09-29 DIAGNOSIS — Z79899 Other long term (current) drug therapy: Secondary | ICD-10-CM | POA: Insufficient documentation

## 2021-09-29 DIAGNOSIS — R0602 Shortness of breath: Secondary | ICD-10-CM | POA: Insufficient documentation

## 2021-09-29 DIAGNOSIS — M2550 Pain in unspecified joint: Secondary | ICD-10-CM | POA: Diagnosis not present

## 2021-09-29 DIAGNOSIS — Z8572 Personal history of non-Hodgkin lymphomas: Secondary | ICD-10-CM | POA: Insufficient documentation

## 2021-09-29 DIAGNOSIS — Z90722 Acquired absence of ovaries, bilateral: Secondary | ICD-10-CM | POA: Diagnosis not present

## 2021-09-29 DIAGNOSIS — R5383 Other fatigue: Secondary | ICD-10-CM | POA: Insufficient documentation

## 2021-09-29 DIAGNOSIS — Z888 Allergy status to other drugs, medicaments and biological substances status: Secondary | ICD-10-CM | POA: Insufficient documentation

## 2021-09-29 DIAGNOSIS — Z8719 Personal history of other diseases of the digestive system: Secondary | ICD-10-CM | POA: Diagnosis not present

## 2021-09-29 DIAGNOSIS — I700 Atherosclerosis of aorta: Secondary | ICD-10-CM | POA: Diagnosis not present

## 2021-09-29 DIAGNOSIS — Z8249 Family history of ischemic heart disease and other diseases of the circulatory system: Secondary | ICD-10-CM | POA: Insufficient documentation

## 2021-09-29 DIAGNOSIS — C8308 Small cell B-cell lymphoma, lymph nodes of multiple sites: Secondary | ICD-10-CM | POA: Insufficient documentation

## 2021-09-29 DIAGNOSIS — Z9049 Acquired absence of other specified parts of digestive tract: Secondary | ICD-10-CM | POA: Insufficient documentation

## 2021-09-29 DIAGNOSIS — Z806 Family history of leukemia: Secondary | ICD-10-CM | POA: Diagnosis not present

## 2021-09-29 DIAGNOSIS — Z885 Allergy status to narcotic agent status: Secondary | ICD-10-CM | POA: Insufficient documentation

## 2021-09-29 DIAGNOSIS — Z86010 Personal history of colonic polyps: Secondary | ICD-10-CM | POA: Insufficient documentation

## 2021-09-29 DIAGNOSIS — M549 Dorsalgia, unspecified: Secondary | ICD-10-CM | POA: Diagnosis not present

## 2021-09-29 DIAGNOSIS — R059 Cough, unspecified: Secondary | ICD-10-CM | POA: Insufficient documentation

## 2021-09-29 DIAGNOSIS — I252 Old myocardial infarction: Secondary | ICD-10-CM | POA: Insufficient documentation

## 2021-09-29 DIAGNOSIS — K5730 Diverticulosis of large intestine without perforation or abscess without bleeding: Secondary | ICD-10-CM | POA: Diagnosis not present

## 2021-09-29 LAB — COMPREHENSIVE METABOLIC PANEL
ALT: 11 U/L (ref 0–44)
AST: 14 U/L — ABNORMAL LOW (ref 15–41)
Albumin: 3.8 g/dL (ref 3.5–5.0)
Alkaline Phosphatase: 64 U/L (ref 38–126)
Anion gap: 9 (ref 5–15)
BUN: 26 mg/dL — ABNORMAL HIGH (ref 8–23)
CO2: 28 mmol/L (ref 22–32)
Calcium: 9.2 mg/dL (ref 8.9–10.3)
Chloride: 100 mmol/L (ref 98–111)
Creatinine, Ser: 0.77 mg/dL (ref 0.44–1.00)
GFR, Estimated: >60 mL/min (ref >60)
Glucose, Bld: 66 mg/dL — ABNORMAL LOW (ref 70–99)
Potassium: 3.3 mmol/L — ABNORMAL LOW (ref 3.5–5.1)
Sodium: 137 mmol/L (ref 135–145)
Total Bilirubin: 0.2 mg/dL — ABNORMAL LOW (ref 0.3–1.2)
Total Protein: 6.8 g/dL (ref 6.5–8.1)

## 2021-09-29 LAB — CBC WITH DIFFERENTIAL/PLATELET
Abs Immature Granulocytes: 0.11 10*3/uL — ABNORMAL HIGH (ref 0.00–0.07)
Basophils Absolute: 0.1 10*3/uL (ref 0.0–0.1)
Basophils Relative: 2 %
Eosinophils Absolute: 0.2 10*3/uL (ref 0.0–0.5)
Eosinophils Relative: 4 %
HCT: 37.1 % (ref 36.0–46.0)
Hemoglobin: 12.4 g/dL (ref 12.0–15.0)
Immature Granulocytes: 2 %
Lymphocytes Relative: 56 %
Lymphs Abs: 2.7 10*3/uL (ref 0.7–4.0)
MCH: 31.3 pg (ref 26.0–34.0)
MCHC: 33.4 g/dL (ref 30.0–36.0)
MCV: 93.7 fL (ref 80.0–100.0)
Monocytes Absolute: 0.5 10*3/uL (ref 0.1–1.0)
Monocytes Relative: 10 %
Neutro Abs: 1.2 10*3/uL — ABNORMAL LOW (ref 1.7–7.7)
Neutrophils Relative %: 26 %
Platelets: 178 10*3/uL (ref 150–400)
RBC: 3.96 MIL/uL (ref 3.87–5.11)
RDW: 13.2 % (ref 11.5–15.5)
WBC: 4.7 10*3/uL (ref 4.0–10.5)
nRBC: 0 % (ref 0.0–0.2)

## 2021-09-29 LAB — LACTATE DEHYDROGENASE: LDH: 118 U/L (ref 98–192)

## 2021-09-29 NOTE — Progress Notes (Signed)
[place] OFFICE PROGRESS NOTE  Patient Care Team: [name], MD as PCP - General (Unknown Physician Specialty) Wellington Hampshire, MD as PCP - Cardiology (Cardiology) Byrnett, Forest Gleason, MD (General Surgery) Forest Gleason, MD (Oncology) Wellington Hampshire, MD as Consulting Physician (Cardiology) Cammie Sickle, MD as Consulting Physician (Hematology and Oncology)   Cancer Staging  No matching staging information was found for the patient.    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  #May 24, 2021-rituximab weekly x4; FEB 2023- PET significant partial response.  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  Accompanied by son.  Jeanne Pittman 87 y.o.  female pleasant patient above history of recurrent low-grade lymphoma/B cell non-Hodgkin-currently s/p weekly rituximab is here for follow-up-in review results of the PET scan.  Patient admits to itching around the site of shingles.  Her appetite is improving.  Continues to complain of fatigue.  Gaining weight.  No fever no chills.  Leading up with the PET scan patient episode of cough/shortness of breath-sputum which eventually resolved.  She complains of mild shortness of breath at this time.  Not any worse   Review of Systems  Constitutional:  Positive for malaise/fatigue and weight loss. Negative for chills, diaphoresis and fever.  HENT:  Negative for nosebleeds and sore throat.   Eyes:  Negative for double vision.  Respiratory:  Negative for cough, hemoptysis, sputum production, shortness of breath and wheezing.   Cardiovascular:  Negative for chest pain, palpitations, orthopnea and leg swelling.  Gastrointestinal:  Negative for abdominal pain, blood in stool, constipation, diarrhea, heartburn, melena, nausea and vomiting.  Genitourinary:  Negative for dysuria, frequency and urgency.  Musculoskeletal:  Positive for back pain and joint pain.  Skin: Negative.  Negative for itching and rash.  Neurological:  Negative for dizziness, tingling, focal weakness, weakness and headaches.  Endo/Heme/Allergies:  Does not bruise/bleed easily.  Psychiatric/Behavioral:  Negative for depression. The patient is not nervous/anxious and does not have insomnia.    PAST MEDICAL HISTORY :  Past Medical History:  Diagnosis Date   A-fib ([place])    07/2012: A. fib with RVR in the setting of myocardial infarction. Converted to sinus rhythm with amiodarone. No recurrence.   Anxiety    Atrophic vaginitis    Chronic combined  systolic (congestive) and diastolic (congestive) heart failure ([place])    a. EF was 25-35% post MI but improved to 45-50% in 04/2013; b. 03/2017 Echo: EF 40-45%, mod focal/basal  hypertrophy of septum. Sev mid-apicalanteroseptal, ant, and apical HK. Gr1 DD, mild AI/MR, mod TR, PASP 60mHg.   Colon adenomas    Coronary artery disease    a. 15/4008 STEMI complicated by cardiogenic shock. Cath/PCI: LAD 122m (DESx2), RCA 95p(DES). EF 25%; b. 03/2017 MV: EF 57%, fixed apical, periapical, mid to distal anteroseptal defect. No ischemia.   Eczema    Fibrocystic breast disease    Gastritis    GERD (gastroesophageal reflux disease)    Glaucoma    Headache    Hyperlipidemia    Hypertension    Hypothyroidism    Insomnia    Ischemic cardiomyopathy    a. 07/2012 EF 25-35% following MI-->Improved to 45-50%;  b. 03/2017 Echo: EF 40-45%.   Myocardial infarction ([place]) 08/18/2014   Non Hodgkin's lymphoma ([place]) 2005   reoccurance 2007 and 2012   Osteoarthritis    Osteoporosis    Polyneuropathy    Polyposis of colon    Restless leg syndrome    Shingles    right   Urinary, incontinence, stress female     PAST SURGICAL HISTORY :   Past Surgical History:  Procedure Laterality Date   ABDOMINAL HYSTERECTOMY  1956   APPENDECTOMY     AXILLARY LYMPH NODE BIOPSY Left 03/18/2015   Procedure: AXILLARY LYMPH NODE BIOPSY;  Surgeon: [name], MD;  Location: [name];  Service: General;  Laterality: Left;   CARDIAC CATHETERIZATION  08/19/2012   [name]; [facility]   COLONOSCOPY     COLONOSCOPY WITH PROPOFOL N/A 03/02/2016   Procedure: COLONOSCOPY WITH PROPOFOL;  Surgeon: Manya Silvas, MD;  Location[facility] ENDOSCOPY;  Service: Endoscopy;  Laterality: N/A;   COLONOSCOPY WITH PROPOFOL N/A 09/26/2017   Procedure: COLONOSCOPY WITH PROPOFOL;  Surgeon: Manya Silvas, MD;  Location: [facility] ENDOSCOPY;  Service: Endoscopy;  Laterality: N/A;   COLONOSCOPY WITH PROPOFOL N/A 03/11/2020   Procedure: COLONOSCOPY WITH PROPOFOL;  Surgeon: Benjamine Sprague, DO;  Location: [name] ENDOSCOPY;  Service: General;  Laterality: N/A;   CORONARY ANGIOPLASTY WITH STENT PLACEMENT     x3 stents   CORONARY ANGIOPLASTY  WITH STENT PLACEMENT     CORONARY ARTERY BYPASS GRAFT     ESOPHAGOGASTRODUODENOSCOPY (EGD) WITH PROPOFOL N/A 03/02/2016   Procedure: ESOPHAGOGASTRODUODENOSCOPY (EGD) WITH PROPOFOL;  Surgeon: Manya Silvas, MD;  Location: [facility] ENDOSCOPY;  Service: Endoscopy;  Laterality: N/A;   ESOPHAGOGASTRODUODENOSCOPY (EGD) WITH PROPOFOL N/A 04/14/2019   Procedure: ESOPHAGOGASTRODUODENOSCOPY (EGD) WITH PROPOFOL;  Surgeon: Toledo, Benay Pike, MD;  Location: [name] ENDOSCOPY;  Service: Gastroenterology;  Laterality: N/A;   ESOPHAGOGASTRODUODENOSCOPY (EGD) WITH PROPOFOL N/A 12/07/2020   Procedure: ESOPHAGOGASTRODUODENOSCOPY (EGD) WITH PROPOFOL;  Surgeon: Lesly Rubenstein, MD;  Location: [name] ENDOSCOPY;  Service: Endoscopy;  Laterality: N/A;   LYMPH NODE BIOPSY Right 07-24-11   Dr Bary Castilla   MOHS SURGERY     bilateral shoulders   PTCA     skin cancer removal     TOTAL ABDOMINAL HYSTERECTOMY W/ BILATERAL SALPINGOOPHORECTOMY      FAMILY HISTORY :   Family History  Problem Relation Age of Onset   Heart attack Father    Heart disease Brother    Leukemia Mother    Bladder Cancer Neg Hx    Prostate cancer Neg Hx    Kidney cancer Neg Hx    Breast cancer Neg Hx  SOCIAL HISTORY:   Social History   Tobacco Use   Smoking status: Former    Types: Cigarettes    Quit date: 1950    Years since quitting: 73.1   Smokeless tobacco: Never  Vaping Use   Vaping Use: Never used  Substance Use Topics   Alcohol use: No    Alcohol/week: 0.0 standard drinks   Drug use: No    ALLERGIES:  is allergic to ace inhibitors, benadryl [diphenhydramine hcl], codeine, diphenhydramine, guaiacol, hydrocodone, cardizem [diltiazem], and guaifenesin & derivatives.  MEDICATIONS:  Current Outpatient Medications  Medication Sig Dispense Refill   acyclovir (ZOVIRAX) 400 MG tablet Take 1 tablet (400 mg total) by mouth 2 (two) times daily. [to prevent shingles] 60 tablet 1   aspirin 81 MG tablet Take 81 mg by mouth every morning.       carvedilol (COREG) 6.25 MG tablet TAKE 1 TABLET(6.25 MG) BY MOUTH TWICE DAILY WITH A MEAL 180 tablet 1   Cholecalciferol (VITAMIN D-3) 5000 UNITS TABS Take 2,000 Int'l Units by mouth every morning.      DULoxetine (CYMBALTA) 20 MG capsule Take 20 mg by mouth daily.     latanoprost (XALATAN) 0.005 % ophthalmic solution Place 1 drop into both eyes at bedtime.     LORazepam (ATIVAN) 0.5 MG tablet Take 0.5 mg by mouth every 8 (eight) hours as needed for anxiety.     losartan (COZAAR) 25 MG tablet TAKE 1/2 TABLET(12.5 MG) BY MOUTH DAILY AFTER BREAKFAST 45 tablet 3   pantoprazole (PROTONIX) 40 MG tablet TAKE 1 TABLET(40 MG) BY MOUTH TWICE DAILY 180 tablet 0   rosuvastatin (CRESTOR) 10 MG tablet TAKE 1 TABLET(10 MG) BY MOUTH EVERY OTHER DAY 45 tablet 0   timolol (TIMOPTIC) 0.5 % ophthalmic solution Place 1 drop into both eyes 2 (two) times daily.     acetaminophen (TYLENOL) 650 MG CR tablet Take 650 mg by mouth every 8 (eight) hours as needed for pain. (Patient not taking: Reported on 09/29/2021)     fexofenadine (ALLEGRA) 180 MG tablet Take 180 mg by mouth daily as needed for rhinitis.  (Patient not taking: Reported on 05/30/2021)     ibuprofen (ADVIL) 800 MG tablet Take 800 mg by mouth every 6 (six) hours as needed. (Patient not taking: Reported on 05/30/2021)     No current facility-administered medications for this visit.    PHYSICAL EXAMINATION: ECOG PERFORMANCE STATUS: 0 - Asymptomatic  BP (!) 146/82 (BP Location: Left Arm, Patient Position: Sitting, Cuff Size: Small)    Pulse 65    Temp (!) 96.8 F (36 C) (Tympanic)    Ht 5\' 1"  (1.549 m)    Wt 96 lb 3.2 oz (43.6 kg)    SpO2 100%    BMI 18.18 kg/m   Filed Weights   09/29/21 0942  Weight: 96 lb 3.2 oz (43.6 kg)   Excoriation/skin rash associated with itching on the abdomen posterior/right side.  But no evidence of active shingles.  No lymphadenopathy noted.  Physical Exam HENT:     Head: Normocephalic and atraumatic.      Mouth/Throat:     Pharynx: No oropharyngeal exudate.  Eyes:     Pupils: Pupils are equal, round, and reactive to light.  Cardiovascular:     Rate and Rhythm: Normal rate and regular rhythm.  Pulmonary:     Effort: No respiratory distress.     Breath sounds: No wheezing.  Abdominal:     General: Bowel sounds are normal. There is no  distension.     Palpations: Abdomen is soft. There is no mass.     Tenderness: There is no abdominal tenderness. There is no guarding or rebound.  Musculoskeletal:        General: No tenderness. Normal range of motion.     Cervical back: Normal range of motion and neck supple.  Skin:    General: Skin is warm.  Neurological:     Mental Status: She is alert and oriented to person, place, and time.  Psychiatric:        Mood and Affect: Affect normal.     LABORATORY DATA:  I have reviewed the data as listed    Component Value Date/Time   NA 137 09/29/2021 0928   NA 141 09/07/2014 1449   K 3.3 (L) 09/29/2021 0928   K 4.0 09/07/2014 1449   CL 100 09/29/2021 0928   CL 106 09/07/2014 1449   CO2 28 09/29/2021 0928   CO2 29 09/07/2014 1449   GLUCOSE 66 (L) 09/29/2021 0928   GLUCOSE 109 (H) 09/07/2014 1449   BUN 26 (H) 09/29/2021 0928   BUN 19 (H) 09/07/2014 1449   CREATININE 0.77 09/29/2021 0928   CREATININE 1.01 09/07/2014 1449   CALCIUM 9.2 09/29/2021 0928   CALCIUM 8.8 09/07/2014 1449   PROT 6.8 09/29/2021 0928   PROT 6.3 09/02/2015 0824   PROT 6.5 09/07/2014 1449   ALBUMIN 3.8 09/29/2021 0928   ALBUMIN 4.2 09/02/2015 0824   ALBUMIN 3.8 09/07/2014 1449   AST 14 (L) 09/29/2021 0928   AST 11 (L) 09/07/2014 1449   ALT 11 09/29/2021 0928   ALT 22 09/07/2014 1449   ALKPHOS 64 09/29/2021 0928   ALKPHOS 75 09/07/2014 1449   BILITOT 0.2 (L) 09/29/2021 0928   BILITOT 0.4 09/02/2015 0824   BILITOT 0.8 09/07/2014 1449   GFRNONAA >60 09/29/2021 0928   GFRNONAA 56 (L) 09/07/2014 1449   GFRNONAA 44 (L) 02/09/2014 1351   GFRAA 59 (L) 02/27/2020 1023    GFRAA >60 09/07/2014 1449   GFRAA 51 (L) 02/09/2014 1351    No results found for: SPEP, UPEP  Lab Results  Component Value Date   WBC 4.7 09/29/2021   NEUTROABS 1.2 (L) 09/29/2021   HGB 12.4 09/29/2021   HCT 37.1 09/29/2021   MCV 93.7 09/29/2021   PLT 178 09/29/2021      Chemistry      Component Value Date/Time   NA 137 09/29/2021 0928   NA 141 09/07/2014 1449   K 3.3 (L) 09/29/2021 0928   K 4.0 09/07/2014 1449   CL 100 09/29/2021 0928   CL 106 09/07/2014 1449   CO2 28 09/29/2021 0928   CO2 29 09/07/2014 1449   BUN 26 (H) 09/29/2021 0928   BUN 19 (H) 09/07/2014 1449   CREATININE 0.77 09/29/2021 0928   CREATININE 1.01 09/07/2014 1449      Component Value Date/Time   CALCIUM 9.2 09/29/2021 0928   CALCIUM 8.8 09/07/2014 1449   ALKPHOS 64 09/29/2021 0928   ALKPHOS 75 09/07/2014 1449   AST 14 (L) 09/29/2021 0928   AST 11 (L) 09/07/2014 1449   ALT 11 09/29/2021 0928   ALT 22 09/07/2014 1449   BILITOT 0.2 (L) 09/29/2021 0928   BILITOT 0.4 09/02/2015 0824   BILITOT 0.8 09/07/2014 1449      RADIOGRAPHIC STUDIES: I have personally reviewed the radiological images as listed and agreed with the findings in the report. No results found.   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 Status post multiple lines of therapy in the past; PET scan- July 2022-progressive disease/lymphoma chest abdominal lymphadenopathy.  Lung nodules-right upper lobe-perifissural suspicious of lymphoma rather than infection/inflammation; October 2022 finished -s/p Rituxan weely x4-  Interval positive response to therapy. Previously enlarged and hypermetabolic bilateral neck, bilateral axillary, mediastinal,retroperitoneal and bilateral pelvic lymph nodes have all decreased in size and metabolism. Residual Deauville score 2 activity.   #Had a long discussion with patient  and son regarding further management of lymphoma-given her age/frailty and given fairly indolent course of her disease [17 years]-I think is reasonable to monitor her with imaging every 6 months.  Patient's/family agreement.  #Recent bronchitis-PET scan imaging- waxing and waning patchy tree-in-bud opacities throughout the dependent right lung as described, favoring infectious or inflammatory process.  Symptoms improving.  If worse would recommend evaluation with PCP.  # Weight loss: Possibility of lymphoma-s/p Rituxan-patient is gaining weight.  # Post herpetic neurlagia/Itch- recommend pramoxine topical ;stop Acyclovir.   #Incidental findings on PET Imaging dated: Feb, 2023:3. Chronic findings include: Aortic Atherosclerosis Coronary atherosclerosis. Moderate sigmoid diverticulosis. I reviewed/discussed/counseled the patient.  # DISPOSITION:  # Follow up in 6 months- MD labs- cbc/cmp/LDH; PET scan prior Dr.B  # I reviewed the blood work- with the patient in detail; also reviewed the imaging independently [as summarized above]; and with the patient in detail.       Orders Placed This Encounter  Procedures   NM PET Image Restage (PS) Skull Base to Thigh (F-18 FDG)    Standing Status:   Future    Standing Expiration Date:   09/29/2022    Order Specific Question:   If indicated for the ordered procedure, I authorize the administration of a radiopharmaceutical per Radiology protocol    Answer:   Yes    Order Specific Question:   Preferred imaging location?    Answer:   [facility]   CBC with Differential/Platelet    Standing Status:   Future    Standing Expiration Date:   09/29/2022   Comprehensive metabolic panel    Standing Status:   Future    Standing Expiration Date:   09/29/2022   Lactate dehydrogenase    Standing Status:   Future    Standing Expiration Date:   09/29/2022     All questions were answered. The patient knows to call the clinic with any problems, questions or  concerns.      Cammie Sickle, MD 09/29/2021 10:52 AM

## 2021-09-29 NOTE — Assessment & Plan Note (Addendum)
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 Status post multiple lines of therapy in the past; PET scan- July 2022-progressive disease/lymphoma chest abdominal lymphadenopathy.  Lung nodules-right upper lobe-perifissural suspicious of lymphoma rather than infection/inflammation; October 2022 finished -s/p Rituxan weely x4-  Interval positive response to therapy. Previously enlarged and hypermetabolic bilateral neck, bilateral axillary, mediastinal,retroperitoneal and bilateral pelvic lymph nodes have all decreased in size and metabolism. Residual Deauville score 2 activity.   #Had a long discussion with patient and son regarding further management of lymphoma-given her age/frailty and given fairly indolent course of her disease [17 years]-I think is reasonable to monitor her with imaging every 6 months.  Patient's/family agreement.  #Recent bronchitis-PET scan imaging- waxing and waning patchy tree-in-bud opacities throughout the dependent right lung as described, favoring infectious or inflammatory process.  Symptoms improving.  If worse would recommend evaluation with PCP.  # Weight loss: Possibility of lymphoma-s/p Rituxan-patient is gaining weight.  # Post herpetic neurlagia/Itch- recommend pramoxine topical ;stop Acyclovir.   #Incidental findings on PET Imaging dated: Feb, 2023:3. Chronic findings include: Aortic Atherosclerosis Coronary atherosclerosis. Moderate sigmoid diverticulosis. I reviewed/discussed/counseled the patient.  # DISPOSITION:  # Follow up in 6 months- MD labs- cbc/cmp/LDH; PET scan prior Dr.B  # I reviewed the blood work- with the patient in detail; also reviewed the imaging independently [as summarized above]; and with the patient in detail.

## 2021-09-29 NOTE — Progress Notes (Signed)
Concerned she may have shingles on her abdomen.  Asking if she needs to stop acyclovir?  C/o bump on right eye.  Results of scan done last week.

## 2021-10-02 ENCOUNTER — Other Ambulatory Visit: Payer: Self-pay | Admitting: Cardiovascular Disease

## 2021-10-04 ENCOUNTER — Ambulatory Visit: Payer: Medicare Other | Admitting: Cardiovascular Disease

## 2021-10-11 ENCOUNTER — Ambulatory Visit (INDEPENDENT_AMBULATORY_CARE_PROVIDER_SITE_OTHER): Payer: Medicare Other | Admitting: Cardiovascular Disease

## 2021-10-11 ENCOUNTER — Other Ambulatory Visit: Payer: Self-pay

## 2021-10-11 ENCOUNTER — Encounter: Payer: Self-pay | Admitting: Cardiovascular Disease

## 2021-10-11 VITALS — BP 158/70 | HR 70 | Ht 61.0 in | Wt 95.4 lb

## 2021-10-11 DIAGNOSIS — I2510 Atherosclerotic heart disease of native coronary artery without angina pectoris: Secondary | ICD-10-CM | POA: Diagnosis not present

## 2021-10-11 DIAGNOSIS — I5022 Chronic systolic (congestive) heart failure: Secondary | ICD-10-CM | POA: Diagnosis not present

## 2021-10-11 DIAGNOSIS — E785 Hyperlipidemia, unspecified: Secondary | ICD-10-CM | POA: Diagnosis not present

## 2021-10-11 DIAGNOSIS — I480 Paroxysmal atrial fibrillation: Secondary | ICD-10-CM

## 2021-10-11 MED ORDER — LOSARTAN POTASSIUM 25 MG PO TABS: 25.0000 mg | ORAL_TABLET | Freq: Every day | ORAL | 2 refills | Status: DC

## 2021-10-11 MED ORDER — PANTOPRAZOLE SODIUM 40 MG PO TBEC: 40.0000 mg | DELAYED_RELEASE_TABLET | Freq: Two times a day (BID) | ORAL | 3 refills | Status: DC

## 2021-10-11 NOTE — Progress Notes (Signed)
Cardiology Office Note   Date:  10/11/2021   ID:  Jeanne Pittman, DOB [birth year], MRN [id number]  PCP:  [name], MD  Cardiologist:   Kathlyn Sacramento, MD   Chief Complaint  Patient presents with   Other    6 month f/u pt wants to discuss recent chemo rx and scans c/o elevated BP. Meds reviewed verbally with pt.      History of Present Illness: Jeanne Pittman is a 87 y.o. female who presents for a followup visit regarding coronary artery disease and chronic systolic heart failure due to ischemic cardiomyopathy. She had anterior ST elevation myocardial infarction in December 2013 with late presentation complicated by cardiogenic shock. Emergent cardiac catheterization showed an occluded mid LAD and 95% stenosis in the proximal RCA. 2 drug-eluting stents were placed in the mid LAD and 1 drug-eluting stent to the proximal RCA. Ejection fraction was 25-30% with akinesis of the mid distal anterior, apical and distal inferior wall.  She has known history of non-Hodgkin's lymphoma. She has known history of myalgia with atorvastatin but she has been tolerating rosuvastatin. She was hospitalized in May 2021 with diverticulitis and improved with treatment.  She did have A. fib with RVR on presentation but converted to sinus rhythm.    Most recent echocardiogram in March 2022 showed an EF of 35 to 40% with mild mitral regurgitation and moderate tricuspid regurgitation.  Outpatient ZIO monitor showed sinus rhythm with short runs of SVT.  Average heart rate was 73 bpm.  No evidence of atrial fibrillation.   She had shingles last year.  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  She received chemotherapy last year and Rituxan.  She has been doing reasonably well from a cardiac standpoint with no chest pain or significant dyspnea.  She does complain of chronic fatigue.  Her blood pressure has been elevated lately.  Past Medical History:  Diagnosis Date   A-fib  ([place])    07/2012: A. fib with RVR in the setting of myocardial infarction. Converted to sinus rhythm with amiodarone. No recurrence.   Anxiety    Atrophic vaginitis    Chronic combined systolic (congestive) and diastolic (congestive) heart failure ([place])    a. EF was 25-35% post MI but improved to 45-50% in 04/2013; b. 03/2017 Echo: EF 40-45%, mod focal/basal hypertrophy of septum. Sev mid-apicalanteroseptal, ant, and apical HK. Gr1 DD, mild AI/MR, mod TR, PASP 61mHg.   Colon adenomas    Coronary artery disease    a. 60/1093 STEMI complicated by cardiogenic shock. Cath/PCI: LAD 125m (DESx2), RCA 95p(DES). EF 25%; b. 03/2017 MV: EF 57%, fixed apical, periapical, mid to distal anteroseptal defect. No ischemia.   Eczema    Fibrocystic breast disease    Gastritis    GERD (gastroesophageal reflux disease)    Glaucoma    Headache    Hyperlipidemia    Hypertension    Hypothyroidism    Insomnia    Ischemic cardiomyopathy    a. 07/2012 EF 25-35% following MI-->Improved to 45-50%;  b. 03/2017 Echo: EF 40-45%.   Myocardial infarction ([place]) 08/18/2014   Non Hodgkin's lymphoma ([place]) 2005   reoccurance 2007 and 2012   Osteoarthritis    Osteoporosis    Polyneuropathy    Polyposis of colon    Restless leg syndrome    Shingles    right   Urinary, incontinence, stress female     Past Surgical History:  Procedure Laterality Date  ABDOMINAL HYSTERECTOMY  1956   APPENDECTOMY     AXILLARY LYMPH NODE BIOPSY Left 03/18/2015   Procedure: AXILLARY LYMPH NODE BIOPSY;  Surgeon: [name], MD;  Location: [name];  Service: General;  Laterality: Left;   CARDIAC CATHETERIZATION  08/19/2012   [name]; Tyger Oka   COLONOSCOPY     COLONOSCOPY WITH PROPOFOL N/A 03/02/2016   Procedure: COLONOSCOPY WITH PROPOFOL;  Surgeon: Manya Silvas, MD;  Location: [facility] ENDOSCOPY;  Service: Endoscopy;  Laterality: N/A;   COLONOSCOPY WITH PROPOFOL N/A 09/26/2017   Procedure: COLONOSCOPY WITH PROPOFOL;  Surgeon: Manya Silvas, MD;  Location: [facility] ENDOSCOPY;  Service: Endoscopy;  Laterality: N/A;   COLONOSCOPY WITH PROPOFOL N/A 03/11/2020   Procedure: COLONOSCOPY WITH PROPOFOL;  Surgeon: Benjamine Sprague, DO;  Location: [name] ENDOSCOPY;  Service: General;  Laterality: N/A;   CORONARY ANGIOPLASTY WITH STENT PLACEMENT     x3 stents   CORONARY ANGIOPLASTY WITH STENT PLACEMENT     CORONARY ARTERY BYPASS GRAFT     ESOPHAGOGASTRODUODENOSCOPY (EGD) WITH PROPOFOL N/A 03/02/2016   Procedure: ESOPHAGOGASTRODUODENOSCOPY (EGD) WITH PROPOFOL;  Surgeon: Manya Silvas, MD;  Location: [facility] ENDOSCOPY;  Service: Endoscopy;  Laterality: N/A;   ESOPHAGOGASTRODUODENOSCOPY (EGD) WITH PROPOFOL N/A 04/14/2019   Procedure: ESOPHAGOGASTRODUODENOSCOPY (EGD) WITH PROPOFOL;  Surgeon: [place], Benay Pike, MD;  Location: [name] ENDOSCOPY;  Service: Gastroenterology;  Laterality: N/A;   ESOPHAGOGASTRODUODENOSCOPY (EGD) WITH PROPOFOL N/A 12/07/2020   Procedure: ESOPHAGOGASTRODUODENOSCOPY (EGD) WITH PROPOFOL;  Surgeon: Lesly Rubenstein, MD;  Location: [name] ENDOSCOPY;  Service: Endoscopy;  Laterality: N/A;   LYMPH NODE BIOPSY Right 07-24-11   Dr Bary Castilla   MOHS SURGERY     bilateral shoulders   PTCA     skin cancer removal     TOTAL ABDOMINAL HYSTERECTOMY W/ BILATERAL SALPINGOOPHORECTOMY       Current Outpatient Medications  Medication Sig Dispense Refill   acetaminophen (TYLENOL) 650 MG CR tablet Take 650 mg by mouth every 8 (eight) hours as needed for pain.     aspirin 81 MG tablet Take 81 mg by mouth every morning.      carvedilol (COREG) 6.25 MG tablet TAKE 1 TABLET(6.25 MG) BY MOUTH TWICE DAILY WITH A MEAL 180 tablet 1   Cholecalciferol (VITAMIN D-3) 5000 UNITS TABS Take 2,000 Int'l Units by mouth every morning.      DULoxetine (CYMBALTA) 20 MG capsule Take 20 mg by mouth daily.     fexofenadine (ALLEGRA) 180 MG tablet Take 180 mg by mouth daily as needed for rhinitis.     ibuprofen (ADVIL) 800 MG tablet Take 800 mg by mouth every 6  (six) hours as needed.     latanoprost (XALATAN) 0.005 % ophthalmic solution Place 1 drop into both eyes at bedtime.     LORazepam (ATIVAN) 0.5 MG tablet Take 0.5 mg by mouth every 8 (eight) hours as needed for anxiety.     losartan (COZAAR) 25 MG tablet TAKE 1/2 TABLET(12.5 MG) BY MOUTH DAILY AFTER BREAKFAST 45 tablet 3   pantoprazole (PROTONIX) 40 MG tablet TAKE 1 TABLET(40 MG) BY MOUTH TWICE DAILY 180 tablet 0   rosuvastatin (CRESTOR) 10 MG tablet TAKE 1 TABLET(10 MG) BY MOUTH EVERY OTHER DAY 45 tablet 0   timolol (TIMOPTIC) 0.5 % ophthalmic solution Place 1 drop into both eyes 2 (two) times daily.     No current facility-administered medications for this visit.    Allergies:   Ace inhibitors, Benadryl [diphenhydramine hcl], Codeine, Diphenhydramine, Guaiacol, Hydrocodone, Cardizem [diltiazem], and Guaifenesin & derivatives  Social History:  The patient  reports that she quit smoking about 73 years ago. Her smoking use included cigarettes. She has never used smokeless tobacco. She reports that she does not drink alcohol and does not use drugs.   Family History:  The patient's family history includes Heart attack in her father; Heart disease in her brother; Leukemia in her mother.    ROS:  Please see the history of present illness.   Otherwise, review of systems are positive for none.   All other systems are reviewed and negative.    PHYSICAL EXAM: VS:  BP (!) 158/70 (BP Location: Left Arm, Patient Position: Sitting, Cuff Size: Normal)    Pulse 70    Ht 5\' 1"  (1.549 m)    Wt 95 lb 6 oz (43.3 kg)    SpO2 97%    BMI 18.02 kg/m  , BMI Body mass index is 18.02 kg/m. GEN: Well nourished, well developed, in no acute distress  HEENT: normal  Neck: no JVD, carotid bruits, or masses Cardiac: RRR; no murmurs, rubs, or gallops,no edema  Respiratory:  clear to auscultation bilaterally, normal work of breathing GI: soft, nontender, nondistended, + BS MS: no deformity or atrophy  Skin: warm  and dry, no rash Neuro:  Strength and sensation are intact Psych: euthymic mood, full affect   EKG:  EKG is ordered today. The ekg ordered today demonstrates normal sinus rhythm with prior anterior infarct and left axis deviation.    Recent Labs: 09/29/2021: ALT 11; BUN 26; Creatinine, Ser 0.77; Hemoglobin 12.4; Platelets 178; Potassium 3.3; Sodium 137    Lipid Panel    Component Value Date/Time   CHOL 172 09/02/2015 0824   TRIG 111 09/02/2015 0824   HDL 51 09/02/2015 0824   CHOLHDL 3.4 09/02/2015 0824   LDLCALC 99 09/02/2015 0824      Wt Readings from Last 3 Encounters:  10/11/21 95 lb 6 oz (43.3 kg)  09/29/21 96 lb 3.2 oz (43.6 kg)  06/30/21 94 lb 11.2 oz (43 kg)        ASSESSMENT AND PLAN:  1.  Coronary artery disease involving native coronary arteries without angina: She is doing reasonably well from a cardiac standpoint with no anginal symptoms.  Continue medical therapy.  2. Hyperlipidemia: Continue treatment with rosuvastatin.  LDL has been below 70.  I reviewed most recent lipid profile done 2 weeks ago which showed an LDL of 52.  3.  Chronic systolic heart failure due to ischemic cardiomyopathy: She continues to be euvolemic even after stopping furosemide.   Her blood pressure is elevated and thus I elected to increase losartan to 25 mg daily.  Continue carvedilol.  4.  Paroxysmal atrial fibrillation: Brief episodes of A. fib in the setting of acute illness.  ZIO monitor showed no evidence of atrial fibrillation.  No need for anticoagulation.    5.  GERD: I refilled pantoprazole.  Disposition:   FU with me in 6 months  Signed,  Kathlyn Sacramento, MD  10/11/2021 2:24 PM    [place]

## 2021-10-11 NOTE — Patient Instructions (Signed)
Medication Instructions:  Your physician has recommended you make the following change in your medication:   INCREASE Losartan to 25 mg daily. An Rx has been sent to your pharmacy  *If you need a refill on your cardiac medications before your next appointment, please call your pharmacy*   Lab Work: None ordered If you have labs (blood work) drawn today and your tests are completely normal, you will receive your results only by: [place] (if you have MyChart) OR A paper copy in the mail If you have any lab test that is abnormal or we need to change your treatment, we will call you to review the results.   Testing/Procedures: None ordered   Follow-Up: At [facility], you and your health needs are our priority.  As part of our continuing mission to provide you with exceptional heart care, we have created designated Provider Care Teams.  These Care Teams include your primary Cardiologist (physician) and Advanced Practice Providers (APPs -  Physician Assistants and Nurse Practitioners) who all work together to provide you with the care you need, when you need it.  We recommend signing up for the patient portal called "MyChart".  Sign up information is provided on this After Visit Summary.  MyChart is used to connect with patients for Virtual Visits (Telemedicine).  Patients are able to view lab/test results, encounter notes, upcoming appointments, etc.  Non-urgent messages can be sent to your provider as well.   To learn more about what you can do with MyChart, go to [email or web address].    Your next appointment:   6 month(s)  The format for your next appointment:   In Person  Provider:   You may see Kathlyn Sacramento, MD or one of the following Advanced Practice Providers on your designated Care Team:   Murray Hodgkins, NP Christell Faith, PA-C Cadence Kathlen Mody, PA-C1}    Other Instructions N/A

## 2021-11-20 ENCOUNTER — Other Ambulatory Visit: Payer: Self-pay | Admitting: Cardiovascular Disease

## 2021-11-27 ENCOUNTER — Emergency Department
Admission: EM | Admit: 2021-11-27 | Discharge: 2021-11-27 | Disposition: A | Discharge status: Home / Self Care | Payer: Medicare Other | Attending: Emergency Medicine | Admitting: Emergency Medicine

## 2021-11-27 ENCOUNTER — Emergency Department: Payer: Medicare Other

## 2021-11-27 ENCOUNTER — Encounter: Payer: Self-pay | Admitting: Emergency Medicine

## 2021-11-27 DIAGNOSIS — W1839XA Other fall on same level, initial encounter: Secondary | ICD-10-CM | POA: Diagnosis not present

## 2021-11-27 DIAGNOSIS — Y9301 Activity, walking, marching and hiking: Secondary | ICD-10-CM | POA: Diagnosis not present

## 2021-11-27 DIAGNOSIS — S4992XA Unspecified injury of left shoulder and upper arm, initial encounter: Secondary | ICD-10-CM | POA: Diagnosis present

## 2021-11-27 DIAGNOSIS — W19XXXA Unspecified fall, initial encounter: Secondary | ICD-10-CM

## 2021-11-27 DIAGNOSIS — S42295A Other nondisplaced fracture of upper end of left humerus, initial encounter for closed fracture: Secondary | ICD-10-CM | POA: Diagnosis not present

## 2021-11-27 MED ORDER — OXYCODONE-ACETAMINOPHEN 5-325 MG PO TABS: 1.0000 | ORAL_TABLET | Freq: Four times a day (QID) | ORAL | 0 refills | Status: DC | PRN

## 2021-11-27 MED ORDER — OXYCODONE-ACETAMINOPHEN 5-325 MG PO TABS
1.0000 | ORAL_TABLET | Freq: Once | ORAL | Status: AC
  Administered 2021-11-27: 1 via ORAL
  Filled 2021-11-27: qty 1

## 2021-11-27 NOTE — ED Provider Notes (Signed)
? ?[facility] ?Provider Note ? ?Patient Contact: 8:51 PM (approximate) ? ? ?History  ? ?Fall ? ? ?HPI ? ?Jeanne Pittman is a 87 y.o. female who presents the emergency department complaining of left shoulder pain/injury after a mechanical fall.  Patient was walking her son's dog when the dog took off after a squirrel.  Patient was pulled to the ground.  She tried to catch herself with her left arm and had immediate pain and inability to move the arm.  She not hit her head or lose consciousness.  She denies any headache, facial pain, neck pain, any other injury other than left shoulder ?  ? ? ?Physical Exam  ? ?Triage Vital Signs: ?ED Triage Vitals  ?Enc Vitals Group  ?   BP 11/27/21 1912 (!) 154/61  ?   Pulse Rate 11/27/21 1912 76  ?   Resp 11/27/21 1912 16  ?   Temp 11/27/21 1912 98.2 ?F (36.8 ?C)  ?   Temp Source 11/27/21 1912 Oral  ?   SpO2 11/27/21 1912 97 %  ?   Weight 11/27/21 1906 95 lb (43.1 kg)  ?   Height 11/27/21 1906 '5\' 1"'$  (1.549 m)  ?   Head Circumference --   ?   Peak Flow --   ?   Pain Score --   ?   Pain Loc --   ?   Pain Edu? --   ?   Excl. in [place]? --   ? ? ?Most recent vital signs: ?Vitals:  ? 11/27/21 1912  ?BP: (!) 154/61  ?Pulse: 76  ?Resp: 16  ?Temp: 98.2 ?F (36.8 ?C)  ?SpO2: 97%  ? ? ? ?General: Alert and in no acute distress. ?Head: No acute traumatic findings  ?Neck: No stridor. No cervical spine tenderness to palpation.  ?Cardiovascular:  Good peripheral perfusion ?Respiratory: Normal respiratory effort without tachypnea or retractions. Lungs CTAB. ?Musculoskeletal: Full range of motion to all extremities.  Limited range of motion to the left shoulder, tenderness throughout the proximal left humerus without significant palpable abnormality.  No range of motion is performed at this time.  Radial pulse and sensation intact distally. ?Neurologic:  No gross focal neurologic deficits are appreciated.  ?Skin:   No rash noted ?Other: ? ? ?ED Results / Procedures / Treatments   ? ?Labs ?(all labs ordered are listed, but only abnormal results are displayed) ?Labs Reviewed - No data to display ? ? ?EKG ? ? ? ? ?RADIOLOGY ? ?I personally viewed and evaluated these images as part of my medical decision making, as well as reviewing the written report by the radiologist. ? ?ED Provider Interpretation: Findings consistent with comminuted impacted left proximal humerus fracture ? ?DG Shoulder Left ? ?Result Date: 11/27/2021 ?CLINICAL DATA:  fall EXAM: LEFT SHOULDER - 2+ VIEW; LEFT HUMERUS - 2+ VIEW COMPARISON:  None. FINDINGS: Osteopenia. There is a comminuted mildly impacted fracture of the proximal humerus. There is extension through the greater tubercle and surgical neck. Glenohumeral articulation appears preserved. Mild degenerative changes of the acromioclavicular joint and glenohumeral joint. IMPRESSION: Mildly comminuted and impacted fracture of the LEFT proximal humerus. Electronically Signed   By: Valentino Saxon M.D.   On: 11/27/2021 19:41  ? ?DG Humerus Left ? ?Result Date: 11/27/2021 ?CLINICAL DATA:  fall EXAM: LEFT SHOULDER - 2+ VIEW; LEFT HUMERUS - 2+ VIEW COMPARISON:  None. FINDINGS: Osteopenia. There is a comminuted mildly impacted fracture of the proximal humerus. There is extension through the greater  tubercle and surgical neck. Glenohumeral articulation appears preserved. Mild degenerative changes of the acromioclavicular joint and glenohumeral joint. IMPRESSION: Mildly comminuted and impacted fracture of the LEFT proximal humerus. Electronically Signed   By: Valentino Saxon M.D.   On: 11/27/2021 19:41   ? ?PROCEDURES: ? ?Critical Care performed: No ? ?Procedures ? ? ?MEDICATIONS ORDERED IN ED: ?Medications  ?oxyCODONE-acetaminophen (PERCOCET/ROXICET) 5-325 MG per tablet 1 tablet (has no administration in time range)  ? ? ? ?IMPRESSION / MDM / ASSESSMENT AND PLAN / ED COURSE  ?I reviewed the triage vital signs and the nursing notes. ?             ?                ? ?Differential diagnosis includes, but is not limited to, shoulder fracture, humerus fracture, contusion, rotator cuff tear ? ? ?Patient's diagnosis is consistent with proximal humerus fracture of the left shoulder.  Patient presented to the emergency department after a mechanical fall where she was pulled down by a dog.  Patient injured her left shoulder and denied any other complaints.  Unable to move the left shoulder this time.  X-ray revealed comminuted impacted fracture of the proximal left humerus.  She is placed in a sling, prescribed pain medications and referred to orthopedic for further management.  Return precautions discussed with the patient and her son.. Patient is given ED precautions to return to the ED for any worsening or new symptoms. ? ? ? ?  ? ? ?FINAL CLINICAL IMPRESSION(S) / ED DIAGNOSES  ? ?Final diagnoses:  ?Fall, initial encounter  ?Other closed nondisplaced fracture of proximal end of left humerus, initial encounter  ? ? ? ?Rx / DC Orders  ? ?ED Discharge Orders   ? ?      Ordered  ?  oxyCODONE-acetaminophen (PERCOCET/ROXICET) 5-325 MG tablet  Every 6 hours PRN       ? 11/27/21 2103  ? ?  ?  ? ?  ? ? ? ?Note:  This document was prepared using Dragon voice recognition software and may include unintentional dictation errors. ?  ?Darletta Moll, PA-C ?11/27/21 2104 ? ?  ?Lucrezia Starch, MD ?11/28/21 [phone] ? ?

## 2021-11-27 NOTE — ED Triage Notes (Signed)
Pt reports she fell forward onto soft mud, while walking dog and dog took off after a squirrel. Pt sts she is not sure if she hit her head when falling forward. C/o left shoulder and upper arm pain without ability to move without pain. Pt denies LOC.  ?

## 2021-12-16 ENCOUNTER — Encounter: Payer: Self-pay | Admitting: Internal Medicine

## 2021-12-16 ENCOUNTER — Other Ambulatory Visit: Payer: Self-pay

## 2021-12-16 MED ORDER — HYDROCODONE-ACETAMINOPHEN 5-325 MG PO TABS
ORAL_TABLET | ORAL | 0 refills | Status: DC
  Filled 2021-12-16: qty 30, 5d supply, fill #0

## 2022-02-09 ENCOUNTER — Other Ambulatory Visit: Payer: Self-pay | Admitting: Internal Medicine

## 2022-02-09 DIAGNOSIS — N6489 Other specified disorders of breast: Secondary | ICD-10-CM

## 2022-02-27 HISTORY — PX: MOHS SURGERY: SUR867

## 2022-03-13 ENCOUNTER — Other Ambulatory Visit: Payer: Self-pay

## 2022-03-14 ENCOUNTER — Inpatient Hospital Stay: Admission: RE | Admit: 2022-03-14 | Payer: Medicare Other | Source: Ambulatory Visit

## 2022-03-14 ENCOUNTER — Other Ambulatory Visit: Payer: Medicare Other

## 2022-03-21 ENCOUNTER — Other Ambulatory Visit: Payer: Self-pay

## 2022-03-27 ENCOUNTER — Other Ambulatory Visit: Payer: Medicare Other

## 2022-03-27 ENCOUNTER — Encounter
Admission: RE | Admit: 2022-03-27 | Discharge: 2022-03-27 | Disposition: A | Discharge status: Home / Self Care | Payer: Medicare Other | Source: Ambulatory Visit | Attending: Internal Medicine | Admitting: Internal Medicine

## 2022-03-27 DIAGNOSIS — C8304 Small cell B-cell lymphoma, lymph nodes of axilla and upper limb: Secondary | ICD-10-CM | POA: Diagnosis present

## 2022-03-27 LAB — GLUCOSE, CAPILLARY: Glucose-Capillary: 96 mg/dL (ref 70–99)

## 2022-03-27 MED ORDER — FLUDEOXYGLUCOSE F - 18 (FDG) INJECTION
5.2000 | Freq: Once | INTRAVENOUS | Status: AC | PRN
  Administered 2022-03-27: 5.68 via INTRAVENOUS

## 2022-03-30 ENCOUNTER — Encounter: Payer: Self-pay | Admitting: Internal Medicine

## 2022-03-30 ENCOUNTER — Inpatient Hospital Stay: Payer: Medicare Other | Attending: Internal Medicine

## 2022-03-30 ENCOUNTER — Inpatient Hospital Stay (HOSPITAL_BASED_OUTPATIENT_CLINIC_OR_DEPARTMENT_OTHER): Payer: Medicare Other | Admitting: Internal Medicine

## 2022-03-30 DIAGNOSIS — R634 Abnormal weight loss: Secondary | ICD-10-CM | POA: Insufficient documentation

## 2022-03-30 DIAGNOSIS — Z8249 Family history of ischemic heart disease and other diseases of the circulatory system: Secondary | ICD-10-CM | POA: Insufficient documentation

## 2022-03-30 DIAGNOSIS — Z885 Allergy status to narcotic agent status: Secondary | ICD-10-CM | POA: Insufficient documentation

## 2022-03-30 DIAGNOSIS — C8308 Small cell B-cell lymphoma, lymph nodes of multiple sites: Secondary | ICD-10-CM | POA: Insufficient documentation

## 2022-03-30 DIAGNOSIS — J988 Other specified respiratory disorders: Secondary | ICD-10-CM | POA: Diagnosis not present

## 2022-03-30 DIAGNOSIS — R0602 Shortness of breath: Secondary | ICD-10-CM | POA: Insufficient documentation

## 2022-03-30 DIAGNOSIS — R61 Generalized hyperhidrosis: Secondary | ICD-10-CM | POA: Diagnosis not present

## 2022-03-30 DIAGNOSIS — C8304 Small cell B-cell lymphoma, lymph nodes of axilla and upper limb: Secondary | ICD-10-CM

## 2022-03-30 DIAGNOSIS — I252 Old myocardial infarction: Secondary | ICD-10-CM | POA: Diagnosis not present

## 2022-03-30 DIAGNOSIS — Z888 Allergy status to other drugs, medicaments and biological substances status: Secondary | ICD-10-CM | POA: Diagnosis not present

## 2022-03-30 DIAGNOSIS — M2550 Pain in unspecified joint: Secondary | ICD-10-CM | POA: Insufficient documentation

## 2022-03-30 DIAGNOSIS — Z8719 Personal history of other diseases of the digestive system: Secondary | ICD-10-CM | POA: Insufficient documentation

## 2022-03-30 DIAGNOSIS — Z86010 Personal history of colonic polyps: Secondary | ICD-10-CM | POA: Insufficient documentation

## 2022-03-30 DIAGNOSIS — I2510 Atherosclerotic heart disease of native coronary artery without angina pectoris: Secondary | ICD-10-CM | POA: Insufficient documentation

## 2022-03-30 DIAGNOSIS — Z79899 Other long term (current) drug therapy: Secondary | ICD-10-CM | POA: Diagnosis not present

## 2022-03-30 DIAGNOSIS — M549 Dorsalgia, unspecified: Secondary | ICD-10-CM | POA: Insufficient documentation

## 2022-03-30 DIAGNOSIS — Z806 Family history of leukemia: Secondary | ICD-10-CM | POA: Diagnosis not present

## 2022-03-30 DIAGNOSIS — R509 Fever, unspecified: Secondary | ICD-10-CM | POA: Diagnosis not present

## 2022-03-30 DIAGNOSIS — Z681 Body mass index (BMI) 19 or less, adult: Secondary | ICD-10-CM | POA: Diagnosis not present

## 2022-03-30 DIAGNOSIS — I5042 Chronic combined systolic (congestive) and diastolic (congestive) heart failure: Secondary | ICD-10-CM | POA: Diagnosis not present

## 2022-03-30 DIAGNOSIS — Z9049 Acquired absence of other specified parts of digestive tract: Secondary | ICD-10-CM | POA: Insufficient documentation

## 2022-03-30 DIAGNOSIS — Z90722 Acquired absence of ovaries, bilateral: Secondary | ICD-10-CM | POA: Diagnosis not present

## 2022-03-30 LAB — CBC WITH DIFFERENTIAL/PLATELET
Abs Immature Granulocytes: 0.17 10*3/uL — ABNORMAL HIGH (ref 0.00–0.07)
Basophils Absolute: 0 10*3/uL (ref 0.0–0.1)
Basophils Relative: 0 %
Eosinophils Absolute: 0.1 10*3/uL (ref 0.0–0.5)
Eosinophils Relative: 2 %
HCT: 34.3 % — ABNORMAL LOW (ref 36.0–46.0)
Hemoglobin: 11.4 g/dL — ABNORMAL LOW (ref 12.0–15.0)
Immature Granulocytes: 4 %
Lymphocytes Relative: 39 %
Lymphs Abs: 1.9 10*3/uL (ref 0.7–4.0)
MCH: 32 pg (ref 26.0–34.0)
MCHC: 33.2 g/dL (ref 30.0–36.0)
MCV: 96.3 fL (ref 80.0–100.0)
Monocytes Absolute: 0.4 10*3/uL (ref 0.1–1.0)
Monocytes Relative: 7 %
Neutro Abs: 2.4 10*3/uL (ref 1.7–7.7)
Neutrophils Relative %: 48 %
Platelets: 130 10*3/uL — ABNORMAL LOW (ref 150–400)
RBC: 3.56 MIL/uL — ABNORMAL LOW (ref 3.87–5.11)
RDW: 13.4 % (ref 11.5–15.5)
WBC: 4.9 10*3/uL (ref 4.0–10.5)
nRBC: 0 % (ref 0.0–0.2)

## 2022-03-30 LAB — COMPREHENSIVE METABOLIC PANEL
ALT: 11 U/L (ref 0–44)
AST: 15 U/L (ref 15–41)
Albumin: 3.4 g/dL — ABNORMAL LOW (ref 3.5–5.0)
Alkaline Phosphatase: 67 U/L (ref 38–126)
Anion gap: 6 (ref 5–15)
BUN: 18 mg/dL (ref 8–23)
CO2: 27 mmol/L (ref 22–32)
Calcium: 8.8 mg/dL — ABNORMAL LOW (ref 8.9–10.3)
Chloride: 106 mmol/L (ref 98–111)
Creatinine, Ser: 0.84 mg/dL (ref 0.44–1.00)
GFR, Estimated: >60 mL/min (ref >60)
Glucose, Bld: 90 mg/dL (ref 70–99)
Potassium: 3.4 mmol/L — ABNORMAL LOW (ref 3.5–5.1)
Sodium: 139 mmol/L (ref 135–145)
Total Bilirubin: 0.6 mg/dL (ref 0.3–1.2)
Total Protein: 5.9 g/dL — ABNORMAL LOW (ref 6.5–8.1)

## 2022-03-30 LAB — LACTATE DEHYDROGENASE: LDH: 121 U/L (ref 98–192)

## 2022-03-30 NOTE — Assessment & Plan Note (Addendum)
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 Status post multiple lines of therapy in the past; October 2022 finished -s/p Rituxan weely x4- PET scan -AUG 8th, 2023-  Index lymph nodes in the chest, abdomen, and pelvis are stable to minimally decreased in size in the interval and generally show an overall trend towards decreased FDG uptake (residual [place] 2 Activity).  Labs reviewed within normal limits.  Clinically patient is not having any flareup of progressive disease from the lymphoma. [see below]  # Night sweats-? Fever low grade [subjective] [drenching for last 6 months; every night]- ? COVID [? 2 months] vs other-waxing and waning tree-in-bud unclear etiology.  Patient is immunocompromised from use of Rituxan.  Question atypical infection causing the symptoms.  Recommend evaluation with Pulmonlogy.   # Weight loss: UNlikley from Lymphoma [given improvement of PET scan AUG 2023].will refer to Nutrition; [facility]  # Post herpetic neurlagia/Itch- recommend pramoxine topical ;stop Acyclovir- STABLE.   # DISPOSITION:  # refer to Joli re: weight loss # refer to Dr.Aleskerov re: drenching night sweats/? Atypical Lung inection # Follow up in 3 months- MD labs- cbc/cmp/LDH- Dr.B  # I reviewed the blood work- with the patient in detail; also reviewed the imaging independently [as summarized above]; and with the patient in detail.

## 2022-03-30 NOTE — Progress Notes (Signed)
[place] OFFICE PROGRESS NOTE  Patient Care Team: [name], MD as PCP - General (Unknown Physician Specialty) Wellington Hampshire, MD as PCP - Cardiology (Cardiology) Byrnett, Forest Gleason, MD (General Surgery) Forest Gleason, MD (Oncology) Wellington Hampshire, MD as Consulting Physician (Cardiology) Cammie Sickle, MD as Consulting Physician (Hematology and Oncology)   Cancer Staging  No matching staging information was found for the patient.    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  #May 24, 2021-rituximab weekly x4; FEB 2023- PET significant partial response.  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  Accompanied by  son.  Jeanne Pittman 87 y.o.  female pleasant patient above history of recurrent low-grade lymphoma/B cell non-Hodgkin-currently s/p weekly rituximab is here for follow-up-in review results of the PET scan.  Patient complains of ongoing night sweats drenching almost every night.  Over the last 6 months.  Low-grade fevers subjective.  Mild intermittent shortness of breath.  No ongoing cough.  Patient had episode of "respiratory infection" couple of months ago.   Patient admits to itching around the site of shingles.  Her appetite is improving.  Continues to complain of fatigue.  Lost weight.  Had skin cancer taken off for right lower leg.  Review of Systems  Constitutional:  Positive for malaise/fatigue and weight loss. Negative for chills, diaphoresis and fever.  HENT:  Negative for nosebleeds and sore throat.   Eyes:  Negative for double vision.  Respiratory:  Negative for cough, hemoptysis, sputum production, shortness of breath and wheezing.   Cardiovascular:  Negative for chest pain, palpitations, orthopnea and leg swelling.  Gastrointestinal:  Negative for abdominal pain, blood in stool, constipation, diarrhea, heartburn, melena, nausea and vomiting.  Genitourinary:  Negative for dysuria, frequency and urgency.  Musculoskeletal:  Positive for back pain and joint pain.  Skin: Negative.  Negative for itching and rash.  Neurological:  Negative for dizziness, tingling, focal weakness, weakness and headaches.  Endo/Heme/Allergies:  Does not bruise/bleed easily.  Psychiatric/Behavioral:  Negative for depression. The patient is not nervous/anxious and does not have insomnia.     PAST MEDICAL HISTORY :  Past Medical History:  Diagnosis Date   A-fib ([place])    07/2012: A. fib with RVR in the setting of myocardial infarction. Converted to sinus rhythm  with amiodarone. No recurrence.   Anxiety    Atrophic vaginitis    Chronic combined systolic (congestive) and diastolic (congestive) heart  failure ([place])    a. EF was 25-35% post MI but improved to 45-50% in 04/2013; b. 03/2017 Echo: EF 40-45%, mod focal/basal hypertrophy of septum. Sev mid-apicalanteroseptal, ant, and apical HK. Gr1 DD, mild AI/MR, mod TR, PASP 53mg.   Colon adenomas    Coronary artery disease    a. 166/2947STEMI complicated by cardiogenic shock. Cath/PCI: LAD 1038mDESx2), RCA 95p(DES). EF 25%; b. 03/2017 MV: EF 57%, fixed apical, periapical, mid to distal anteroseptal defect. No ischemia.   Eczema    Fibrocystic breast disease    Gastritis    GERD (gastroesophageal reflux disease)    Glaucoma    Headache    Hyperlipidemia    Hypertension    Hypothyroidism    Insomnia    Ischemic cardiomyopathy    a. 07/2012 EF 25-35% following MI-->Improved to 45-50%;  b. 03/2017 Echo: EF 40-45%.   [place] ([facility]   [place] Hod[place]kin's [place]   reoccurance 2007 and 2012   Osteoarthritis    Osteoporosis    Polyneuropathy    Polyposis of colon    Restless leg syndrome    Shingles    right   Urinary, incontinence, stress female     PAST SURGICAL HISTORY :   Past Surgical History:  Procedure Laterality Date   ABDOMINAL HYSTERECTOMY  1956   APPENDECTOMY     AXILLARY LYMPH NODE BIOPSY Left 03/18/2015   Procedure: AXILLARY LYMPH NODE BIOPSY;  Surgeon: JeRobert BellowMD;  Location: [name];  Service: General;  Laterality: Left;   CARDIAC CATHETERIZATION  08/19/2012   [name]; [facility]   COLONOSCOPY     COLONOSCOPY WITH PROPOFOL N/A 03/02/2016   Procedure: COLONOSCOPY WITH PROPOFOL;  Surgeon: RoManya SilvasMD;  [facility];  Service: Endoscopy;  Laterality: N/A;   COLONOSCOPY WITH PROPOFOL N/A 09/26/2017   Procedure: COLONOSCOPY WITH PROPOFOL;  Surgeon: ElManya SilvasMD;  [facility];  Service: Endoscopy;  Laterality: N/A;   COLONOSCOPY WITH PROPOFOL N/A 03/11/2020   Procedure: COLONOSCOPY WITH PROPOFOL;  Surgeon: SaBenjamine SpragueDO;  Location: [name]  ENDOSCOPY;  Service: General;  Laterality: N/A;   CORONARY ANGIOPLASTY WITH STENT PLACEMENT     x3 stents   CORONARY ANGIOPLASTY WITH STENT PLACEMENT     CORONARY ARTERY BYPASS GRAFT     ESOPHAGOGASTRODUODENOSCOPY (EGD) WITH PROPOFOL N/A 03/02/2016   Procedure: ESOPHAGOGASTRODUODENOSCOPY (EGD) WITH PROPOFOL;  Surgeon: RoManya SilvasMD;  [facility];  Service: Endoscopy;  Laterality: N/A;   ESOPHAGOGASTRODUODENOSCOPY (EGD) WITH PROPOFOL N/A 04/14/2019   Procedure: ESOPHAGOGASTRODUODENOSCOPY (EGD) WITH PROPOFOL;  Surgeon: Toledo, TeBenay PikeMD;  Location: [name] ENDOSCOPY;  Service: Gastroenterology;  Laterality: N/A;   ESOPHAGOGASTRODUODENOSCOPY (EGD) WITH PROPOFOL N/A 12/07/2020   Procedure: ESOPHAGOGASTRODUODENOSCOPY (EGD) WITH PROPOFOL;  Surgeon: LoLesly RubensteinMD;  Location: [name] ENDOSCOPY;  Service: Endoscopy;  Laterality: N/A;   LYMPH NODE BIOPSY Right 07/24/2011   Dr ByBary Castilla MOHS SURGERY     bilateral shoulders   MOHS SURGERY Right 02/27/2022   Squamous cell   PTCA     skin cancer removal     TOTAL ABDOMINAL HYSTERECTOMY W/ BILATERAL SALPINGOOPHORECTOMY      FAMILY HISTORY :   Family History  Problem Relation Age of Onset   Heart attack Father    Heart disease Brother    Leukemia Mother  Bladder Cancer Neg Hx    Prostate cancer Neg Hx    Kidney cancer Neg Hx    Breast cancer Neg Hx     SOCIAL HISTORY:   Social History   Tobacco Use   Smoking status: Former    Types: Cigarettes    Quit date: 1950    Years since quitting: 73.6   Smokeless tobacco: Never  Vaping Use   Vaping Use: Never used  Substance Use Topics   Alcohol use: No    Alcohol/week: 0.0 standard drinks of alcohol   Drug use: No    ALLERGIES:  is allergic to ace inhibitors, benadryl [diphenhydramine hcl], codeine, diphenhydramine, guaiacol, hydrocodone, cardizem [diltiazem], and guaifenesin & derivatives.  MEDICATIONS:  Current Outpatient Medications  Medication Sig  Dispense Refill   acetaminophen (TYLENOL) 650 MG CR tablet Take 650 mg by mouth every 8 (eight) hours as needed for pain.     aspirin 81 MG tablet Take 81 mg by mouth every morning.      carvedilol (COREG) 6.25 MG tablet TAKE 1 TABLET(6.25 MG) BY MOUTH TWICE DAILY WITH A MEAL 180 tablet 1   Cholecalciferol (VITAMIN D-3) 5000 UNITS TABS Take 2,000 Int'l Units by mouth every morning.      DULoxetine (CYMBALTA) 20 MG capsule Take 20 mg by mouth daily.     fexofenadine (ALLEGRA) 180 MG tablet Take 180 mg by mouth daily as needed for rhinitis.     ibuprofen (ADVIL) 800 MG tablet Take 800 mg by mouth every 6 (six) hours as needed.     latanoprost (XALATAN) 0.005 % ophthalmic solution Place 1 drop into both eyes at bedtime.     LORazepam (ATIVAN) 0.5 MG tablet Take 0.5 mg by mouth every 8 (eight) hours as needed for anxiety.     losartan (COZAAR) 25 MG tablet Take 1 tablet (25 mg total) by mouth daily. 90 tablet 2   pantoprazole (PROTONIX) 40 MG tablet Take 1 tablet (40 mg total) by mouth 2 (two) times daily. 180 tablet 3   rosuvastatin (CRESTOR) 10 MG tablet TAKE 1 TABLET(10 MG) BY MOUTH EVERY OTHER DAY 45 tablet 0   timolol (TIMOPTIC) 0.5 % ophthalmic solution Place 1 drop into both eyes 2 (two) times daily.     No current facility-administered medications for this visit.    PHYSICAL EXAMINATION: ECOG PERFORMANCE STATUS: 0 - Asymptomatic  BP 129/60 (BP Location: Left Arm, Patient Position: Sitting, Cuff Size: Normal)   Pulse 67   Temp (!) 96.6 F (35.9 C) (Tympanic)   Ht '5\' 1"'$  (1.549 m)   Wt 92 lb (41.7 kg)   SpO2 100%   BMI 17.38 kg/m   Filed Weights   03/30/22 1039  Weight: 92 lb (41.7 kg)   Excoriation/skin rash associated with itching on the abdomen posterior/right side.  But no evidence of active shingles.  No lymphadenopathy noted.  Physical Exam HENT:     Head: Normocephalic and atraumatic.     Mouth/Throat:     Pharynx: No oropharyngeal exudate.  Eyes:     Pupils:  Pupils are equal, round, and reactive to light.  Cardiovascular:     Rate and Rhythm: Normal rate and regular rhythm.  Pulmonary:     Effort: No respiratory distress.     Breath sounds: No wheezing.  Abdominal:     General: Bowel sounds are normal. There is no distension.     Palpations: Abdomen is soft. There is no mass.     Tenderness: There is  no abdominal tenderness. There is no guarding or rebound.  Musculoskeletal:        General: No tenderness. Normal range of motion.     Cervical back: Normal range of motion and neck supple.  Skin:    General: Skin is warm.  Neurological:     Mental Status: She is alert and oriented to person, place, and time.  Psychiatric:        Mood and Affect: Affect normal.      LABORATORY DATA:  I have reviewed the data as listed    Component Value Date/Time   NA 139 03/30/2022 1011   NA 141 09/07/2014 1449   K 3.4 (L) 03/30/2022 1011   K 4.0 09/07/2014 1449   CL 106 03/30/2022 1011   CL 106 09/07/2014 1449   CO2 27 03/30/2022 1011   CO2 29 09/07/2014 1449   GLUCOSE 90 03/30/2022 1011   GLUCOSE 109 (H) 09/07/2014 1449   BUN 18 03/30/2022 1011   BUN 19 (H) 09/07/2014 1449   CREATININE 0.84 03/30/2022 1011   CREATININE 1.01 09/07/2014 1449   CALCIUM 8.8 (L) 03/30/2022 1011   CALCIUM 8.8 09/07/2014 1449   PROT 5.9 (L) 03/30/2022 1011   PROT 6.3 09/02/2015 0824   PROT 6.5 09/07/2014 1449   ALBUMIN 3.4 (L) 03/30/2022 1011   ALBUMIN 4.2 09/02/2015 0824   ALBUMIN 3.8 09/07/2014 1449   AST 15 03/30/2022 1011   AST 11 (L) 09/07/2014 1449   ALT 11 03/30/2022 1011   ALT 22 09/07/2014 1449   ALKPHOS 67 03/30/2022 1011   ALKPHOS 75 09/07/2014 1449   BILITOT 0.6 03/30/2022 1011   BILITOT 0.4 09/02/2015 0824   BILITOT 0.8 09/07/2014 1449   GFRNONAA >60 03/30/2022 1011   GFRNONAA 56 (L) 09/07/2014 1449   GFRNONAA 44 (L) 02/09/2014 1351   GFRAA 59 (L) 02/27/2020 1023   GFRAA >60 09/07/2014 1449   GFRAA 51 (L) 02/09/2014 1351    No  results found for: "SPEP", "UPEP"  Lab Results  Component Value Date   WBC 4.9 03/30/2022   NEUTROABS 2.4 03/30/2022   HGB 11.4 (L) 03/30/2022   HCT 34.3 (L) 03/30/2022   MCV 96.3 03/30/2022   PLT 130 (L) 03/30/2022      Chemistry      Component Value Date/Time   NA 139 03/30/2022 1011   NA 141 09/07/2014 1449   K 3.4 (L) 03/30/2022 1011   K 4.0 09/07/2014 1449   CL 106 03/30/2022 1011   CL 106 09/07/2014 1449   CO2 27 03/30/2022 1011   CO2 29 09/07/2014 1449   BUN 18 03/30/2022 1011   BUN 19 (H) 09/07/2014 1449   CREATININE 0.84 03/30/2022 1011   CREATININE 1.01 09/07/2014 1449      Component Value Date/Time   CALCIUM 8.8 (L) 03/30/2022 1011   CALCIUM 8.8 09/07/2014 1449   ALKPHOS 67 03/30/2022 1011   ALKPHOS 75 09/07/2014 1449   AST 15 03/30/2022 1011   AST 11 (L) 09/07/2014 1449   ALT 11 03/30/2022 1011   ALT 22 09/07/2014 1449   BILITOT 0.6 03/30/2022 1011   BILITOT 0.4 09/02/2015 0824   BILITOT 0.8 09/07/2014 1449      RADIOGRAPHIC STUDIES: I have personally reviewed the radiological images as listed and agreed with the findings in the report. No results found.   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 Status  post multiple lines of therapy in the past; October 2022 finished -s/p Rituxan weely x4- PET scan -AUG 8th, 2023-  Index lymph nodes in the chest, abdomen, and pelvis are stable to minimally decreased in size in the interval and generally show an overall trend towards decreased FDG uptake (residual Deauville 2 Activity).  Labs reviewed within normal limits.  Clinically patient is not having any flareup of progressive disease from the lymphoma. [see below]  # Night sweats-? Fever low grade [subjective] [drenching for last 6 months; every night]- ? COVID [? 2 months] vs other-waxing and waning tree-in-bud unclear etiology.   Question atypical infection causing the symptoms.  Recommend evaluation with Pulmonlogy.   # Weight loss: UNlikley from Lymphoma [given improvement of PET scan AUG 2023].will refer to Nutrition; Joli  # Post herpetic neurlagia/Itch- recommend pramoxine topical ;stop Acyclovir- STABLE.   # DISPOSITION:  # refer to Joli re: weight loss # refer to Dr.Aleskerov re: drenching night sweats/? Atypical Lung inection # Follow up in 3 months- MD labs- cbc/cmp/LDH- Dr.B  # I reviewed the blood work- with the patient in detail; also reviewed the imaging independently [as summarized above]; and with the patient in detail.      Orders Placed This Encounter  Procedures   CBC with Differential/Platelet    Standing Status:   Future    Standing Expiration Date:   03/31/2023   Comprehensive metabolic panel    Standing Status:   Future    Standing Expiration Date:   03/31/2023   Lactate dehydrogenase    Standing Status:   Future    Standing Expiration Date:   03/31/2023   Ambulatory Referral to [facility]    Referral Priority:   Routine    Referral Type:   Consultation    Referral Reason:   Specialty Services Required    Number of Visits Requested:   1   Ambulatory referral to Pulmonology    Referral Priority:   Routine    Referral Type:   Consultation    Referral Reason:   Specialty Services Required    Referred to Provider:   Ottie Glazier, MD    Requested Specialty:   Pulmonary Disease    Number of Visits Requested:   1     All questions were answered. The patient knows to call the clinic with any problems, questions or concerns.      Cammie Sickle, MD 03/30/2022 12:21 PM

## 2022-03-30 NOTE — Progress Notes (Signed)
PET results.  Last 2 months has ache back of neck/head area.

## 2022-04-07 ENCOUNTER — Other Ambulatory Visit: Payer: Self-pay | Admitting: Cardiovascular Disease

## 2022-04-11 ENCOUNTER — Encounter: Payer: Self-pay | Admitting: Cardiovascular Disease

## 2022-04-11 ENCOUNTER — Ambulatory Visit (INDEPENDENT_AMBULATORY_CARE_PROVIDER_SITE_OTHER): Payer: Medicare Other | Admitting: Cardiovascular Disease

## 2022-04-11 VITALS — BP 110/60 | HR 66 | Ht 61.0 in | Wt 92.5 lb

## 2022-04-11 DIAGNOSIS — E785 Hyperlipidemia, unspecified: Secondary | ICD-10-CM | POA: Diagnosis not present

## 2022-04-11 DIAGNOSIS — K219 Gastro-esophageal reflux disease without esophagitis: Secondary | ICD-10-CM

## 2022-04-11 DIAGNOSIS — I5022 Chronic systolic (congestive) heart failure: Secondary | ICD-10-CM

## 2022-04-11 DIAGNOSIS — I480 Paroxysmal atrial fibrillation: Secondary | ICD-10-CM | POA: Diagnosis not present

## 2022-04-11 DIAGNOSIS — I2510 Atherosclerotic heart disease of native coronary artery without angina pectoris: Secondary | ICD-10-CM | POA: Diagnosis not present

## 2022-04-11 NOTE — Progress Notes (Signed)
Cardiology Office Note   Date:  04/11/2022   ID:  Jeanne Pittman, DOB Dec 30, 1934, MRN [id number]  PCP:  [name], MD  Cardiologist:   Kathlyn Sacramento, MD   Chief Complaint  Patient presents with   Other    6 month f/u c/o having a fall last night and injured her foot/hit her head when getting out of bed. Meds reviewed verbally with pt.      History of Present Illness: Jeanne Pittman is a 87 y.o. female who presents for a followup visit regarding coronary artery disease and chronic systolic heart failure due to ischemic cardiomyopathy. She had anterior ST elevation myocardial infarction in December 2013 with late presentation complicated by cardiogenic shock. Emergent cardiac catheterization showed an occluded mid LAD and 95% stenosis in the proximal RCA. 2 drug-eluting stents were placed in the mid LAD and 1 drug-eluting stent to the proximal RCA. Ejection fraction was 25-30% with akinesis of the mid distal anterior, apical and distal inferior wall.  She has known history of non-Hodgkin's lymphoma. She has known history of myalgia with atorvastatin but she has been tolerating rosuvastatin. She was hospitalized in May 2021 with diverticulitis and improved with treatment.  She did have A. fib with RVR on presentation but converted to sinus rhythm.    Most recent echocardiogram in March 2022 showed an EF of 35 to 40% with mild mitral regurgitation and moderate tricuspid regurgitation.  Outpatient ZIO monitor showed sinus rhythm with short runs of SVT.  Average heart rate was 73 bpm.  No evidence of atrial fibrillation.   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    She has struggled with chronic fatigue but has no chest pain or worsening dyspnea.  She fell yesterday at home with no loss of consciousness.  She also fell a few weeks ago.  She struggles with low appetite and continued weight loss.   Past Medical History:  Diagnosis Date   A-fib ([place])     07/2012: A. fib with RVR in the setting of myocardial infarction. Converted to sinus rhythm with amiodarone. No recurrence.   Anxiety    Atrophic vaginitis    Chronic combined systolic (congestive) and diastolic (congestive) heart failure ([place])    a. EF was 25-35% post MI but improved to 45-50% in 04/2013; b. 03/2017 Echo: EF 40-45%, mod focal/basal hypertrophy of septum. Sev mid-apicalanteroseptal, ant, and apical HK. Gr1 DD, mild AI/MR, mod TR, PASP 77mg.   Colon adenomas    Coronary artery disease    a. 111/9417STEMI complicated by cardiogenic shock. Cath/PCI: LAD [id number]), RCA 95p(DES). EF 25%; b. 03/2017 MV: EF 57%, fixed apical, periapical, mid to distal anteroseptal defect. No ischemia.   Eczema    Fibrocystic breast disease    Gastritis    GERD (gastroesophageal reflux disease)    Glaucoma    Headache    Hyperlipidemia    Hypertension    Hypothyroidism    Insomnia    Ischemic cardiomyopathy    a. 07/2012 EF 25-35% following MI-->Improved to 45-50%;  b. 03/2017 Echo: EF 40-45%.   [place]   [place]   reoccurance 2007 and 2012   Osteoarthritis    Osteoporosis    Polyneuropathy    Polyposis of colon    Restless leg syndrome    Shingles    right   Urinary, incontinence, stress female     Past Surgical History:  Procedure Laterality Date   ABDOMINAL HYSTERECTOMY  1956   APPENDECTOMY     AXILLARY LYMPH NODE BIOPSY Left 03/18/2015   Procedure: AXILLARY LYMPH NODE BIOPSY;  Surgeon: [name], MD;  Location: [name];  Service: General;  Laterality: Left;   CARDIAC CATHETERIZATION  08/19/2012   [name]; Eiman Maret   COLONOSCOPY     COLONOSCOPY WITH PROPOFOL N/A 03/02/2016   Procedure: COLONOSCOPY WITH PROPOFOL;  Surgeon: Manya Silvas, MD;  Location: [facility] END[facility]COPY;  Service: Endoscopy;  Laterality: N/A;   COLONOSCOPY WITH PROPOFOL N/A 09/26/2017   Procedure: COLONOSCOPY WITH PROPOFOL;  Surgeon: Manya Silvas, MD;  Location: [facility] ENDOSCOPY;  Service: Endoscopy;  Laterality: N/A;   COLONOSCOPY WITH PROPOFOL N/A 03/11/2020   Procedure: COLONOSCOPY WITH PROPOFOL;  Surgeon: Benjamine Sprague, DO;  Location: [name] ENDOSCOPY;  Service: General;  Laterality: N/A;   CORONARY ANGIOPLASTY WITH STENT PLACEMENT     x3 stents   CORONARY ANGIOPLASTY WITH STENT PLACEMENT     CORONARY ARTERY BYPASS GRAFT     ESOPHAGOGASTRODUODENOSCOPY (EGD) WITH PROPOFOL N/A 03/02/2016   Procedure: ESOPHAGOGASTRODUODENOSCOPY (EGD) WITH PROPOFOL;  Surgeon: Manya Silvas, MD;  Location: [facility] ENDOSCOPY;  Service: Endoscopy;  Laterality: N/A;   ESOPHAGOGASTRODUODENOSCOPY (EGD) WITH PROPOFOL N/A 04/14/2019   Procedure: ESOPHAGOGASTRODUODENOSCOPY (EGD) WITH PROPOFOL;  Surgeon: Tol[place], Benay Pike, MD;  Location: [name] ENDOSCOPY;  Service: Gastroenterology;  Laterality: N/A;   ESOPHAGOGASTRODUODENOSCOPY (EGD) WITH PROPOFOL N/A 12/07/2020   Procedure: ESOPHAGOGASTRODUODENOSCOPY (EGD) WITH PROPOFOL;  Surgeon: Lesly Rubenstein, MD;  Location: [name] ENDOSCOPY;  Service: Endoscopy;  Laterality: N/A;   LYMPH NODE BIOPSY Right 07/24/2011   Dr Bary Castilla   MOHS SURGERY     bilateral shoulders   MOHS SURGERY Right 02/27/2022   Squamous cell   PTCA     skin cancer removal     TOTAL ABDOMINAL HYSTERECTOMY W/ BILATERAL SALPINGOOPHORECTOMY       Current Outpatient Medications  Medication Sig Dispense Refill   acetaminophen (TYLENOL) 650 MG CR tablet Take 650 mg by mouth every 8 (eight) hours as needed for pain.     aspirin 81 MG tablet Take 81 mg by mouth every morning.      carvedilol (COREG) 6.25 MG tablet TAKE 1 TABLET(6.25 MG) BY MOUTH TWICE DAILY WITH A MEAL 180 tablet 1   Cholecalciferol (VITAMIN D-3) 5000 UNITS TABS Take 2,000 Int'l Units by mouth every morning.      DULoxetine (CYMBALTA) 20 MG capsule Take 20 mg by mouth daily.     fexofenadine (ALLEGRA) 180 MG tablet Take 180 mg by mouth daily as needed for rhinitis.      ibuprofen (ADVIL) 800 MG tablet Take 800 mg by mouth every 6 (six) hours as needed.     latanoprost (XALATAN) 0.005 % ophthalmic solution Place 1 drop into both eyes at bedtime.     LORazepam (ATIVAN) 0.5 MG tablet Take 0.5 mg by mouth every 8 (eight) hours as needed for anxiety.     losartan (COZAAR) 25 MG tablet Take 1 tablet (25 mg total) by mouth daily. 90 tablet 2   pantoprazole (PROTONIX) 40 MG tablet Take 1 tablet (40 mg total) by mouth 2 (two) times daily. 180 tablet 3   rosuvastatin (CRESTOR) 10 MG tablet TAKE 1 TABLET BY MOUTH EVERY OTHER DAY 45 tablet 0   timolol (TIMOPTIC) 0.5 % ophthalmic solution Place 1 drop into both eyes 2 (two) times daily.     traMADol (ULTRAM) 50 MG tablet Take by mouth as needed.  No current facility-administered medications for this visit.    Allergies:   Ace inhibitors, Benadryl [diphenhydramine hcl], Codeine, Diphenhydramine, Guaiacol, Hydrocodone, Cardizem [diltiazem], and Guaifenesin & derivatives    Social History:  The patient  reports that she quit smoking about 73 years ago. Her smoking use included cigarettes. She has never used smokeless tobacco. She reports that she does not drink alcohol and does not use drugs.   Family History:  The patient's family history includes Heart attack in her father; Heart disease in her brother; Leukemia in her mother.    ROS:  Please see the history of present illness.   Otherwise, review of systems are positive for none.   All other systems are reviewed and negative.    PHYSICAL EXAM: VS:  BP 110/60 (BP Location: Left Arm, Patient Position: Sitting, Cuff Size: Normal)   Pulse 66   Ht '5\' 1"'$  (1.549 m)   Wt 92 lb 8 oz (42 kg)   SpO2 94%   BMI 17.48 kg/m  , BMI Body mass index is 17.48 kg/m. GEN: Well nourished, well developed, in no acute distress  HEENT: normal  Neck: no JVD, carotid bruits, or masses Cardiac: RRR; no murmurs, rubs, or gallops,no edema  Respiratory:  clear to auscultation  bilaterally, normal work of breathing GI: soft, nontender, nondistended, + BS MS: no deformity or atrophy  Skin: warm and dry, no rash Neuro:  Strength and sensation are intact Psych: euthymic mood, full affect   EKG:  EKG is ordered today. The ekg ordered today demonstrates normal sinus rhythm with prior anterior infarct and left axis deviation.    Recent Labs: 03/30/2022: ALT 11; BUN 18; Creatinine, Ser 0.84; Hemoglobin 11.4; Platelets 130; Potassium 3.4; Sodium 139    Lipid Panel    Component Value Date/Time   CHOL 172 09/02/2015 0824   TRIG 111 09/02/2015 0824   HDL 51 09/02/2015 0824   CHOLHDL 3.4 09/02/2015 0824   LDLCALC 99 09/02/2015 0824      Wt Readings from Last 3 Encounters:  04/11/22 92 lb 8 oz (42 kg)  03/30/22 92 lb (41.7 kg)  11/27/21 95 lb (43.1 kg)        ASSESSMENT AND PLAN:  1.  Coronary artery disease involving native coronary arteries without angina: She is doing reasonably well from a cardiac standpoint with no anginal symptoms.  Continue medical therapy.  2. Hyperlipidemia: Continue treatment with rosuvastatin.  I reviewed most recent lipid profile done in March which showed an LDL of 56 and triglyceride of 97.  3.  Chronic systolic heart failure due to ischemic cardiomyopathy: She continues to be euvolemic with diuretics.  Continue losartan and carvedilol.    4.  Paroxysmal atrial fibrillation: Brief episodes of A. fib in the setting of acute illness.  ZIO monitor showed no evidence of atrial fibrillation.  No need for anticoagulation.    5.  GERD: Continue pantoprazole.  6.  Fatigue, poor appetite and weight loss: Likely noncardiac.  Symptoms are not suggestive of chronic mesenteric ischemia as she does not seem to have any postprandial abdominal symptoms.   Disposition:   FU with me in 6 months  Signed,  Kathlyn Sacramento, MD  04/11/2022 3:29 PM    [place]

## 2022-04-11 NOTE — Patient Instructions (Signed)

## 2022-04-17 ENCOUNTER — Telehealth: Payer: Self-pay | Admitting: *Deleted

## 2022-04-17 NOTE — Telephone Encounter (Signed)
Per son patient was to have been referred to Dr Sanjuana Mae but they state they have not received a referral form [place]. He is asking we please send referral asap

## 2022-04-17 NOTE — Telephone Encounter (Signed)
The referral was sent on 03/30/22.  Called and confirmed with [place] office that referral as received.  They will try to contact patient's son today to get scheduled.

## 2022-04-20 NOTE — Addendum Note (Signed)
Addended by: Britt Bottom on: 04/20/2022 12:49 PM   Modules accepted: Orders

## 2022-04-25 ENCOUNTER — Telehealth: Payer: Self-pay

## 2022-04-25 NOTE — Telephone Encounter (Signed)
Patient wants to know if she can get the flu and COVID vaccines this year.

## 2022-04-26 ENCOUNTER — Telehealth: Payer: Self-pay | Admitting: *Deleted

## 2022-04-26 NOTE — Telephone Encounter (Signed)
I spoke to patient this morning, please see phone note

## 2022-04-26 NOTE — Telephone Encounter (Signed)
Patient notified

## 2022-04-26 NOTE — Telephone Encounter (Signed)
Patient son Marya Amsler called asking when patient can get flu shot and if she can get the COVID vaccine this fall. Please advise

## 2022-05-02 ENCOUNTER — Other Ambulatory Visit: Payer: Self-pay | Admitting: Pulmonary Disease

## 2022-05-02 DIAGNOSIS — R000 Tachycardia, unspecified: Secondary | ICD-10-CM

## 2022-05-02 DIAGNOSIS — R0602 Shortness of breath: Secondary | ICD-10-CM

## 2022-05-11 ENCOUNTER — Ambulatory Visit
Admission: RE | Admit: 2022-05-11 | Discharge: 2022-05-11 | Disposition: A | Discharge status: Home / Self Care | Payer: Medicare Other | Source: Ambulatory Visit | Attending: Pulmonary Disease | Admitting: Pulmonary Disease

## 2022-05-11 DIAGNOSIS — R0602 Shortness of breath: Secondary | ICD-10-CM | POA: Diagnosis present

## 2022-05-11 DIAGNOSIS — R000 Tachycardia, unspecified: Secondary | ICD-10-CM | POA: Insufficient documentation

## 2022-05-11 MED ORDER — IOHEXOL 350 MG/ML SOLN
75.0000 mL | Freq: Once | INTRAVENOUS | Status: AC | PRN
  Administered 2022-05-11: 62 mL via INTRAVENOUS

## 2022-07-07 ENCOUNTER — Telehealth: Payer: Self-pay | Admitting: Cardiovascular Disease

## 2022-07-07 MED ORDER — ROSUVASTATIN CALCIUM 10 MG PO TABS
ORAL_TABLET | ORAL | 1 refills | Status: DC
Start: 2022-07-07 — End: 2023-09-04

## 2022-07-07 NOTE — Telephone Encounter (Signed)
*  STAT* If patient is at the pharmacy, call can be transferred to refill team.   1. Which medications need to be refilled? (please list name of each medication and dose if known)  Rosuvastatin  2. Which pharmacy/location (including street and city if local pharmacy) is medication to be sent to?   [facility] [place], [place]  3. Do they need a 30 day or 90 day supply? #[phone]

## 2022-07-11 ENCOUNTER — Other Ambulatory Visit: Payer: Medicare Other

## 2022-07-11 ENCOUNTER — Ambulatory Visit: Payer: Medicare Other | Admitting: Internal Medicine

## 2022-07-17 ENCOUNTER — Inpatient Hospital Stay: Payer: Medicare Other | Attending: Internal Medicine

## 2022-07-17 ENCOUNTER — Inpatient Hospital Stay (HOSPITAL_BASED_OUTPATIENT_CLINIC_OR_DEPARTMENT_OTHER): Payer: Medicare Other | Admitting: Internal Medicine

## 2022-07-17 ENCOUNTER — Encounter: Payer: Self-pay | Admitting: Internal Medicine

## 2022-07-17 VITALS — BP 143/62 | HR 63 | Temp 96.9°F | Resp 20 | Wt 96.4 lb

## 2022-07-17 DIAGNOSIS — C8308 Small cell B-cell lymphoma, lymph nodes of multiple sites: Secondary | ICD-10-CM | POA: Insufficient documentation

## 2022-07-17 DIAGNOSIS — R0981 Nasal congestion: Secondary | ICD-10-CM | POA: Insufficient documentation

## 2022-07-17 DIAGNOSIS — M2550 Pain in unspecified joint: Secondary | ICD-10-CM | POA: Insufficient documentation

## 2022-07-17 DIAGNOSIS — Z885 Allergy status to narcotic agent status: Secondary | ICD-10-CM | POA: Diagnosis not present

## 2022-07-17 DIAGNOSIS — C8304 Small cell B-cell lymphoma, lymph nodes of axilla and upper limb: Secondary | ICD-10-CM

## 2022-07-17 DIAGNOSIS — R634 Abnormal weight loss: Secondary | ICD-10-CM | POA: Diagnosis not present

## 2022-07-17 DIAGNOSIS — Z79899 Other long term (current) drug therapy: Secondary | ICD-10-CM | POA: Diagnosis not present

## 2022-07-17 DIAGNOSIS — Z9049 Acquired absence of other specified parts of digestive tract: Secondary | ICD-10-CM | POA: Insufficient documentation

## 2022-07-17 DIAGNOSIS — Z888 Allergy status to other drugs, medicaments and biological substances status: Secondary | ICD-10-CM | POA: Diagnosis not present

## 2022-07-17 DIAGNOSIS — Z86010 Personal history of colonic polyps: Secondary | ICD-10-CM | POA: Diagnosis not present

## 2022-07-17 DIAGNOSIS — I252 Old myocardial infarction: Secondary | ICD-10-CM | POA: Diagnosis not present

## 2022-07-17 DIAGNOSIS — R61 Generalized hyperhidrosis: Secondary | ICD-10-CM | POA: Insufficient documentation

## 2022-07-17 DIAGNOSIS — Z8719 Personal history of other diseases of the digestive system: Secondary | ICD-10-CM | POA: Diagnosis not present

## 2022-07-17 DIAGNOSIS — R5383 Other fatigue: Secondary | ICD-10-CM | POA: Insufficient documentation

## 2022-07-17 DIAGNOSIS — M549 Dorsalgia, unspecified: Secondary | ICD-10-CM | POA: Diagnosis not present

## 2022-07-17 DIAGNOSIS — Z90722 Acquired absence of ovaries, bilateral: Secondary | ICD-10-CM | POA: Diagnosis not present

## 2022-07-17 DIAGNOSIS — Z8249 Family history of ischemic heart disease and other diseases of the circulatory system: Secondary | ICD-10-CM | POA: Insufficient documentation

## 2022-07-17 DIAGNOSIS — Z806 Family history of leukemia: Secondary | ICD-10-CM | POA: Diagnosis not present

## 2022-07-17 DIAGNOSIS — Z87891 Personal history of nicotine dependence: Secondary | ICD-10-CM | POA: Diagnosis not present

## 2022-07-17 LAB — CBC WITH DIFFERENTIAL/PLATELET
Abs Immature Granulocytes: 0.02 10*3/uL (ref 0.00–0.07)
Basophils Absolute: 0.1 10*3/uL (ref 0.0–0.1)
Basophils Relative: 1 %
Eosinophils Absolute: 0.1 10*3/uL (ref 0.0–0.5)
Eosinophils Relative: 2 %
HCT: 37.6 % (ref 36.0–46.0)
Hemoglobin: 12.9 g/dL (ref 12.0–15.0)
Immature Granulocytes: 0 %
Lymphocytes Relative: 61 %
Lymphs Abs: 5.2 10*3/uL — ABNORMAL HIGH (ref 0.7–4.0)
MCH: 32.1 pg (ref 26.0–34.0)
MCHC: 34.3 g/dL (ref 30.0–36.0)
MCV: 93.5 fL (ref 80.0–100.0)
Monocytes Absolute: 0.4 10*3/uL (ref 0.1–1.0)
Monocytes Relative: 5 %
Neutro Abs: 2.6 10*3/uL (ref 1.7–7.7)
Neutrophils Relative %: 31 %
Platelets: 187 10*3/uL (ref 150–400)
RBC: 4.02 MIL/uL (ref 3.87–5.11)
RDW: 14.1 % (ref 11.5–15.5)
WBC: 8.4 10*3/uL (ref 4.0–10.5)
nRBC: 0 % (ref 0.0–0.2)

## 2022-07-17 LAB — COMPREHENSIVE METABOLIC PANEL WITH GFR
ALT: 12 U/L (ref 0–44)
AST: 15 U/L (ref 15–41)
Albumin: 3.8 g/dL (ref 3.5–5.0)
Alkaline Phosphatase: 69 U/L (ref 38–126)
Anion gap: 8 (ref 5–15)
BUN: 26 mg/dL — ABNORMAL HIGH (ref 8–23)
CO2: 26 mmol/L (ref 22–32)
Calcium: 8.8 mg/dL — ABNORMAL LOW (ref 8.9–10.3)
Chloride: 101 mmol/L (ref 98–111)
Creatinine, Ser: 0.96 mg/dL (ref 0.44–1.00)
GFR, Estimated: 58 mL/min — ABNORMAL LOW
Glucose, Bld: 98 mg/dL (ref 70–99)
Potassium: 4.3 mmol/L (ref 3.5–5.1)
Sodium: 135 mmol/L (ref 135–145)
Total Bilirubin: 0.4 mg/dL (ref 0.3–1.2)
Total Protein: 7.3 g/dL (ref 6.5–8.1)

## 2022-07-17 LAB — LACTATE DEHYDROGENASE: LDH: 109 U/L (ref 98–192)

## 2022-07-17 NOTE — Progress Notes (Signed)
[facility] OFFICE PROGRESS NOTE  Patient Care Team: [name], MD as PCP - General (Unknown Physician Specialty) Wellington Hampshire, MD as PCP - Cardiology (Cardiology) Byrnett, Forest Gleason, MD (General Surgery) Forest Gleason, MD (Oncology) Wellington Hampshire, MD as Consulting Physician (Cardiology) Cammie Sickle, MD as Consulting Physician (Hematology and Oncology)   Cancer Staging  No matching staging information was found for the patient.    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  #May 24, 2021-rituximab weekly x4; FEB 2023- PET significant partial response.  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  Accompanied by  son.  Jeanne Pittman 87 y.o.  female pleasant patient above history of recurrent low-grade lymphoma/B cell non-Hodgkin-currently s/p weekly rituximab is here for follow-up.  In the interim patient was evaluated by pulmonary-s/p antibiotic prednisone-repeat imaging improved.   Patient continues to have ongoing night sweats.  However no new shortness of breath or cough.  Complains of nasal congestion.  Postnasal discharge.  Notes to have gained weight s/p evaluation with nutrition.   Review of Systems  Constitutional:  Positive for malaise/fatigue and weight loss. Negative for chills, diaphoresis and fever.  HENT:  Negative for nosebleeds and sore throat.   Eyes:  Negative for double vision.  Respiratory:  Negative for cough, hemoptysis, sputum production, shortness of breath and wheezing.   Cardiovascular:  Negative for chest pain, palpitations, orthopnea and leg swelling.  Gastrointestinal:  Negative for abdominal pain, blood in stool, constipation, diarrhea, heartburn, melena, nausea and vomiting.  Genitourinary:  Negative for dysuria, frequency and urgency.  Musculoskeletal:  Positive for back pain and joint pain.  Skin: Negative.  Negative for itching and rash.  Neurological:  Negative for dizziness, tingling, focal weakness, weakness and headaches.  Endo/Heme/Allergies:  Does not bruise/bleed easily.  Psychiatric/Behavioral:  Negative for depression. The patient is not nervous/anxious and does not have insomnia.     PAST MEDICAL HISTORY :  Past Medical History:  Diagnosis Date   A-fib ([place])    07/2012: A. fib with RVR in the setting of myocardial infarction. Converted to sinus rhythm with amiodarone. No recurrence.   Anxiety    Atrophic vaginitis    Chronic combined systolic (congestive) and diastolic (congestive) heart failure ([place])    a. EF was 25-35% post  MI but improved to 45-50% in 04/2013; b. 03/2017 Echo: EF 40-45%, mod focal/basal hypertrophy of septum. Sev  mid-apicalanteroseptal, ant, and apical HK. Gr1 DD, mild AI/MR, mod TR, PASP 67mg.   Colon adenomas    Coronary artery disease    a. 178/5885STEMI complicated by cardiogenic shock. Cath/PCI: LAD 1033mDESx2), RCA 95p(DES). EF 25%; b. 03/2017 MV: EF 57%, fixed apical, periapical, mid to distal anteroseptal defect. No ischemia.   Eczema    Fibrocystic breast disease    Gastritis    GERD (gastroesophageal reflux disease)    Glaucoma    Headache    Hyperlipidemia    Hypertension    Hypothyroidism    Insomnia    Ischemic cardiomyopathy    a. 07/2012 EF 25-35% following MI-->Improved to 45-50%;  b. 03/2017 Echo: EF 40-45%.   [place]   [place] Hod[place]kin's [place]   reoccurance 2007 and 2012   Osteoarthritis    Osteoporosis    Polyneuropathy    Polyposis of colon    Restless leg syndrome    Shingles    right   Urinary, incontinence, stress female     PAST SURGICAL HISTORY :   Past Surgical History:  Procedure Laterality Date   ABDOMINAL HYSTERECTOMY  1956   APPENDECTOMY     AXILLARY LYMPH NODE BIOPSY Left 03/18/2015   Procedure: AXILLARY LYMPH NODE BIOPSY;  Surgeon: JeRobert BellowMD;  Location: [name];  Service: General;  Laterality: Left;   CARDIAC CATHETERIZATION  08/19/2012   [name]; [facility]   COLONOSCOPY     COLONOSCOPY WITH PROPOFOL N/A 03/02/2016   Procedure: COLONOSCOPY WITH PROPOFOL;  Surgeon: RoManya SilvasMD;  [facility];  Service: Endoscopy;  Laterality: N/A;   COLONOSCOPY WITH PROPOFOL N/A 09/26/2017   Procedure: COLONOSCOPY WITH PROPOFOL;  Surgeon: ElManya SilvasMD;  [facility];  Service: Endoscopy;  Laterality: N/A;   COLONOSCOPY WITH PROPOFOL N/A 03/11/2020   Procedure: COLONOSCOPY WITH PROPOFOL;  Surgeon: SaBenjamine SpragueDO;  Location: [name] ENDOSCOPY;  Service: General;  Laterality: N/A;   CORONARY ANGIOPLASTY WITH STENT PLACEMENT     x3 stents   CORONARY ANGIOPLASTY WITH STENT PLACEMENT      CORONARY ARTERY BYPASS GRAFT     ESOPHAGOGASTRODUODENOSCOPY (EGD) WITH PROPOFOL N/A 03/02/2016   Procedure: ESOPHAGOGASTRODUODENOSCOPY (EGD) WITH PROPOFOL;  Surgeon: RoManya SilvasMD;  [facility];  Service: Endoscopy;  Laterality: N/A;   ESOPHAGOGASTRODUODENOSCOPY (EGD) WITH PROPOFOL N/A 04/14/2019   Procedure: ESOPHAGOGASTRODUODENOSCOPY (EGD) WITH PROPOFOL;  Surgeon: Toledo, TeBenay PikeMD;  Location: [name] ENDOSCOPY;  Service: Gastroenterology;  Laterality: N/A;   ESOPHAGOGASTRODUODENOSCOPY (EGD) WITH PROPOFOL N/A 12/07/2020   Procedure: ESOPHAGOGASTRODUODENOSCOPY (EGD) WITH PROPOFOL;  Surgeon: LoLesly RubensteinMD;  Location: [name] ENDOSCOPY;  Service: Endoscopy;  Laterality: N/A;   LYMPH NODE BIOPSY Right 07/24/2011   Dr ByBary Castilla MOHS SURGERY     bilateral shoulders   MOHS SURGERY Right 02/27/2022   Squamous cell   PTCA     skin cancer removal     TOTAL ABDOMINAL HYSTERECTOMY W/ BILATERAL SALPINGOOPHORECTOMY      FAMILY HISTORY :   Family History  Problem Relation Age of Onset   Heart attack Father    Heart disease Brother    Leukemia Mother    Bladder Cancer Neg Hx    Prostate cancer Neg Hx    Kidney cancer Neg Hx    Breast cancer Neg Hx     SOCIAL HISTORY:  Social History   Tobacco Use   Smoking status: Former    Types: Cigarettes    Quit date: 1950    Years since quitting: 73.9   Smokeless tobacco: Never  Vaping Use   Vaping Use: Never used  Substance Use Topics   Alcohol use: No    Alcohol/week: 0.0 standard drinks of alcohol   Drug use: No    ALLERGIES:  is allergic to ace inhibitors, benadryl [diphenhydramine hcl], codeine, diphenhydramine, guaiacol, hydrocodone, cardizem [diltiazem], and guaifenesin & derivatives.  MEDICATIONS:  Current Outpatient Medications  Medication Sig Dispense Refill   acetaminophen (TYLENOL) 650 MG CR tablet Take 650 mg by mouth every 8 (eight) hours as needed for pain.     aspirin 81 MG tablet Take 81 mg  by mouth every morning.      carvedilol (COREG) 6.25 MG tablet TAKE 1 TABLET(6.25 MG) BY MOUTH TWICE DAILY WITH A MEAL 180 tablet 1   Cholecalciferol (VITAMIN D-3) 5000 UNITS TABS Take 2,000 Int'l Units by mouth every morning.      DULoxetine (CYMBALTA) 20 MG capsule Take 20 mg by mouth daily.     fexofenadine (ALLEGRA) 180 MG tablet Take 180 mg by mouth daily as needed for rhinitis.     ibuprofen (ADVIL) 800 MG tablet Take 800 mg by mouth every 6 (six) hours as needed.     latanoprost (XALATAN) 0.005 % ophthalmic solution Place 1 drop into both eyes at bedtime.     LORazepam (ATIVAN) 0.5 MG tablet Take 0.5 mg by mouth every 8 (eight) hours as needed for anxiety.     losartan (COZAAR) 25 MG tablet Take 1 tablet (25 mg total) by mouth daily. 90 tablet 2   pantoprazole (PROTONIX) 40 MG tablet Take 1 tablet (40 mg total) by mouth 2 (two) times daily. 180 tablet 3   rosuvastatin (CRESTOR) 10 MG tablet TAKE 1 TABLET BY MOUTH EVERY OTHER DAY 45 tablet 1   timolol (TIMOPTIC) 0.5 % ophthalmic solution Place 1 drop into both eyes 2 (two) times daily.     traMADol (ULTRAM) 50 MG tablet Take by mouth as needed.     No current facility-administered medications for this visit.    PHYSICAL EXAMINATION: ECOG PERFORMANCE STATUS: 0 - Asymptomatic  BP (!) 143/62   Pulse 63   Temp (!) 96.9 F (36.1 C)   Resp 20   Wt 96 lb 6.4 oz (43.7 kg)   SpO2 100%   BMI 18.21 kg/m   Filed Weights   07/17/22 1512  Weight: 96 lb 6.4 oz (43.7 kg)   Excoriation/skin rash associated with itching on the abdomen posterior/right side.  But no evidence of active shingles.  No lymphadenopathy noted.  Physical Exam HENT:     Head: Normocephalic and atraumatic.     Mouth/Throat:     Pharynx: No oropharyngeal exudate.  Eyes:     Pupils: Pupils are equal, round, and reactive to light.  Cardiovascular:     Rate and Rhythm: Normal rate and regular rhythm.  Pulmonary:     Effort: No respiratory distress.     Breath  sounds: No wheezing.  Abdominal:     General: Bowel sounds are normal. There is no distension.     Palpations: Abdomen is soft. There is no mass.     Tenderness: There is no abdominal tenderness. There is no guarding or rebound.  Musculoskeletal:        General: No tenderness. Normal range of motion.  Cervical back: Normal range of motion and neck supple.  Skin:    General: Skin is warm.  Neurological:     Mental Status: She is alert and oriented to person, place, and time.  Psychiatric:        Mood and Affect: Affect normal.      LABORATORY DATA:  I have reviewed the data as listed    Component Value Date/Time   NA 135 07/17/2022 1449   NA 141 09/07/2014 1449   K 4.3 07/17/2022 1449   K 4.0 09/07/2014 1449   CL 101 07/17/2022 1449   CL 106 09/07/2014 1449   CO2 26 07/17/2022 1449   CO2 29 09/07/2014 1449   GLUCOSE 98 07/17/2022 1449   GLUCOSE 109 (H) 09/07/2014 1449   BUN 26 (H) 07/17/2022 1449   BUN 19 (H) 09/07/2014 1449   CREATININE 0.96 07/17/2022 1449   CREATININE 1.01 09/07/2014 1449   CALCIUM 8.8 (L) 07/17/2022 1449   CALCIUM 8.8 09/07/2014 1449   PROT 7.3 07/17/2022 1449   PROT 6.3 09/02/2015 0824   PROT 6.5 09/07/2014 1449   ALBUMIN 3.8 07/17/2022 1449   ALBUMIN 4.2 09/02/2015 0824   ALBUMIN 3.8 09/07/2014 1449   AST 15 07/17/2022 1449   AST 11 (L) 09/07/2014 1449   ALT 12 07/17/2022 1449   ALT 22 09/07/2014 1449   ALKPHOS 69 07/17/2022 1449   ALKPHOS 75 09/07/2014 1449   BILITOT 0.4 07/17/2022 1449   BILITOT 0.4 09/02/2015 0824   BILITOT 0.8 09/07/2014 1449   GFRNONAA 58 (L) 07/17/2022 1449   GFRNONAA 56 (L) 09/07/2014 1449   GFRNONAA 44 (L) 02/09/2014 1351   GFRAA 59 (L) 02/27/2020 1023   GFRAA >60 09/07/2014 1449   GFRAA 51 (L) 02/09/2014 1351    No results found for: "SPEP", "UPEP"  Lab Results  Component Value Date   WBC 8.4 07/17/2022   NEUTROABS 2.6 07/17/2022   HGB 12.9 07/17/2022   HCT 37.6 07/17/2022   MCV 93.5 07/17/2022    PLT 187 07/17/2022      Chemistry      Component Value Date/Time   NA 135 07/17/2022 1449   NA 141 09/07/2014 1449   K 4.3 07/17/2022 1449   K 4.0 09/07/2014 1449   CL 101 07/17/2022 1449   CL 106 09/07/2014 1449   CO2 26 07/17/2022 1449   CO2 29 09/07/2014 1449   BUN 26 (H) 07/17/2022 1449   BUN 19 (H) 09/07/2014 1449   CREATININE 0.96 07/17/2022 1449   CREATININE 1.01 09/07/2014 1449      Component Value Date/Time   CALCIUM 8.8 (L) 07/17/2022 1449   CALCIUM 8.8 09/07/2014 1449   ALKPHOS 69 07/17/2022 1449   ALKPHOS 75 09/07/2014 1449   AST 15 07/17/2022 1449   AST 11 (L) 09/07/2014 1449   ALT 12 07/17/2022 1449   ALT 22 09/07/2014 1449   BILITOT 0.4 07/17/2022 1449   BILITOT 0.4 09/02/2015 0824   BILITOT 0.8 09/07/2014 1449      RADIOGRAPHIC STUDIES: I have personally reviewed the radiological images as listed and agreed with the findings in the report. No results found.   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 Status post multiple lines of therapy in the past; October 2022 finished -s/p Rituxan weely x4- PET scan -AUG 8th, 2023-  Index lymph nodes in the chest, abdomen, and pelvis are stable to  minimally decreased in size in the interval and generally show an overall trend towards decreased FDG uptake (residual Deauville 2 Activity).  Labs reviewed within normal limits.  Clinically patient is not having any flareup of progressive disease from the lymphoma. Will repeat a PET scan in 6 months from now.  PET scan ordered.  # URI/ Post nasal discharge- claritin D BID x1 week.  Hold off any antibiotics.  # Night sweats-? Fever low grade [subjective] [drenching for last 6 months; every night]- ? COVID [? 2 months] vs other-waxing and waning tree-in-bud unclear etiology.  S/p evaluation with Dr.Aleskerov- CT SEP 2023-chest findings resolved.   #  Weight loss: UNlikley from Lymphoma [given improvement of PET scan AUG 2023].s/p Nutrition; Joli- STABLE.   # Post herpetic neurlagia/Itch- recommend pramoxine topical- STABLE.  # DISPOSITION:  # Follow up in 6 months- MD labs- cbc/cmp/LDH; PET scan- Dr.B        Orders Placed This Encounter  Procedures   NM PET Image Restage (PS) Skull Base to Thigh (F-18 FDG)    Standing Status:   Future    Standing Expiration Date:   07/18/2023    Order Specific Question:   If indicated for the ordered procedure, I authorize the administration of a radiopharmaceutical per Radiology protocol    Answer:   Yes    Order Specific Question:   Preferred imaging location?    Answer:    [facility]   CBC with Differential/Platelet    Standing Status:   Future    Standing Expiration Date:   07/17/2023   Comprehensive metabolic panel    Standing Status:   Future    Standing Expiration Date:   07/17/2023   Lactate dehydrogenase    Standing Status:   Future    Standing Expiration Date:   07/18/2023     All questions were answered. The patient knows to call the clinic with any problems, questions or concerns.      Cammie Sickle, MD 07/23/2022 10:23 PM

## 2022-07-17 NOTE — Progress Notes (Signed)
Patient son states she has had a really bad cold the last two weeks. She is starting to get over it but the over the counter medications aren't working. Patient states she is weak and dizzy.

## 2022-07-17 NOTE — Assessment & Plan Note (Addendum)
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 Status post multiple lines of therapy in the past; October 2022 finished -s/p Rituxan weely x4- PET scan -AUG 8th, 2023-  Index lymph nodes in the chest, abdomen, and pelvis are stable to minimally decreased in size in the interval and generally show an overall trend towards decreased FDG uptake (residual Deauville 2 Activity).  Labs reviewed within normal limits.  Clinically patient is not having any flareup of progressive disease from the lymphoma. [see below].  Will repeat a PET scan in 6 months from now.  # URI/ Post nasal discharge- claritin D BID x1 week.   # Night sweats-? Fever low grade [subjective] [drenching for last 6 months; every night]- ? COVID [? 2 months] vs other-waxing and waning tree-in-bud unclear etiology.  S/p evaluation with Dr.Aleskerov- resolved.   # Weight loss: UNlikley from Lymphoma [given improvement of PET scan AUG 2023].s/p Nutrition; Joli- STABLE.   # Post herpetic neurlagia/Itch- recommend pramoxine topical- STABLE.  # DISPOSITION:  # Follow up in 6 months- MD labs- cbc/cmp/LDH; PET scan- Dr.B

## 2022-07-18 ENCOUNTER — Ambulatory Visit: Payer: Medicare Other | Admitting: Internal Medicine

## 2022-07-18 ENCOUNTER — Other Ambulatory Visit: Payer: Medicare Other

## 2022-07-25 IMAGING — CT NM PET TUM IMG RESTAG (PS) SKULL BASE T - THIGH
1 of 8 series · 1 of 25 positions shown · non-contrast
Comparison: 03/24/2021 PET-CT.

CLINICAL DATA: Subsequent treatment strategy for marginal zone
lymphoma with ongoing rituximab.

EXAM:
NUCLEAR MEDICINE PET SKULL BASE TO THIGH
TECHNIQUE: 5.4 mCi F-18 FDG was injected intravenously. Full-ring PET imaging
was performed from the skull base to thigh after the radiotracer. CT
data was obtained and used for attenuation correction and anatomic
localization.
Fasting blood glucose: 79 mg/dl

[Series 3: ct wb 5.0 b30f · axial · 5.0mm · 0.98mm/px · 1 of 290 slices shown]
[im 290/290  brain]
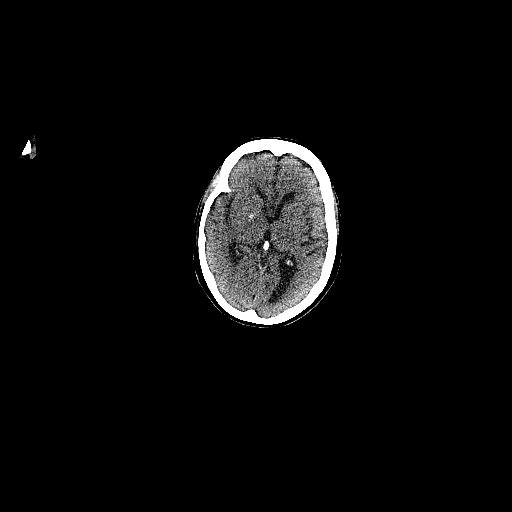

[1 of 25 positions shown; findings below may reference images not displayed]

FINDINGS: Mediastinal blood pool activity: SUV max

Liver activity: SUV max

NECK: Previously described mildly prominent and mildly
hypermetabolic neck lymph nodes are all decreased in size and
metabolism. Representative left level IIb neck 0.5 cm node with max
SUV 1.5 (series 3/image 37), previously 0.8 cm with max SUV 2.1. No
newly enlarged or newly hypermetabolic neck lymph nodes.

Incidental CT findings: Subcentimeter hypodense bilateral thyroid
nodules are not appreciably changed and demonstrate no
hypermetabolism. Not clinically significant; no follow-up imaging
recommended (ref: [HOSPITAL]. [DATE]): 143-50).

CHEST:

Previously visualized enlarged hypermetabolic bilateral axillary,
retropectoral and paratracheal lymph nodes are all decreased in size
and metabolism. Representative 0.8 cm left axillary node with max
SUV 2.0 (series 3/image 70), previously 1.3 cm with max SUV 3.5.
Representative 0.8 cm right axillary node with max SUV 1.6 (series
3/image 70), previously 1.3 cm with max SUV 3.0. Representative
cm right paratracheal node with max SUV 2.1 (series 3/image 86),
previously 1.6 cm with max SUV 3.3.

No newly enlarged or newly hypermetabolic axillary, mediastinal or
hilar lymph nodes.

Waxing and waning patchy tree-in-bud opacities throughout the right
lung, improved in the posterior right upper lobe and worsened in the
posterior right middle and right lower lobes. Low level FDG uptake
associated with the patchy tree-in-bud opacities, for example with
max SUV 2.3 in the dependent right lower lobe. No lung masses or new
discrete pulmonary nodules.

Incidental CT findings: Coronary atherosclerosis. Atherosclerotic
nonaneurysmal thoracic aorta.

ABDOMEN/PELVIS:

Previously visualized enlarged hypermetabolic retroperitoneal and
bilateral pelvic lymph nodes have all decreased in size and
metabolism. Representative 0.9 cm left para-aortic node with max SUV
2.3 (series 3/image 161), previously 1.8 cm with max SUV 3.6.
Representative right common iliac 0.5 cm node with max SUV
(series 3/image 188), previously 1.1 cm with max SUV 2.7.
Representative 0.6 cm left external iliac node with max SUV
(series 3/image 201), previously 1.0 cm with max SUV 3.3.
Representative 0.7 cm right external iliac node with max SUV
(series 3/image 217), previously 1.2 cm with max SUV 3.1. No newly
enlarged or newly hypermetabolic abdominopelvic lymph nodes.

No abnormal hypermetabolic activity within the liver, pancreas,
adrenal glands, or spleen. Normal size spleen.

Incidental CT findings: Subcentimeter hypodense inferior left liver
lesions are too small to characterize and are not appreciably
changed, presumably benign. Small simple bilateral renal cysts,
largest 1.7 cm in the posterior lower left kidney. Moderate sigmoid
diverticulosis. Atherosclerotic nonaneurysmal abdominal aorta.
Hysterectomy.

SKELETON: No focal hypermetabolic activity to suggest skeletal
metastasis.

Incidental CT findings: none
IMPRESSION: 1. Interval positive response to therapy. Previously enlarged and
hypermetabolic bilateral neck, bilateral axillary, mediastinal,
retroperitoneal and bilateral pelvic lymph nodes have all decreased
in size and metabolism. Residual [HOSPITAL] score 2 activity.
2. Waxing and waning patchy tree-in-bud opacities throughout the
dependent right lung as described, favoring infectious or
inflammatory process.
3. Chronic findings include: Aortic Atherosclerosis ([id number]).
Coronary atherosclerosis. Moderate sigmoid diverticulosis.

## 2022-07-27 ENCOUNTER — Other Ambulatory Visit: Payer: Self-pay | Admitting: Cardiovascular Disease

## 2022-08-18 ENCOUNTER — Other Ambulatory Visit: Payer: Self-pay | Admitting: Cardiovascular Disease

## 2022-10-08 ENCOUNTER — Other Ambulatory Visit: Payer: Self-pay | Admitting: Cardiovascular Disease

## 2022-10-17 ENCOUNTER — Encounter: Payer: Self-pay | Admitting: Cardiovascular Disease

## 2022-10-17 ENCOUNTER — Ambulatory Visit: Payer: Medicare Other | Attending: Cardiovascular Disease | Admitting: Cardiovascular Disease

## 2022-10-17 VITALS — BP 140/70 | HR 63 | Ht 61.0 in | Wt 102.2 lb

## 2022-10-17 DIAGNOSIS — I5022 Chronic systolic (congestive) heart failure: Secondary | ICD-10-CM

## 2022-10-17 DIAGNOSIS — K219 Gastro-esophageal reflux disease without esophagitis: Secondary | ICD-10-CM

## 2022-10-17 DIAGNOSIS — I480 Paroxysmal atrial fibrillation: Secondary | ICD-10-CM | POA: Insufficient documentation

## 2022-10-17 DIAGNOSIS — E785 Hyperlipidemia, unspecified: Secondary | ICD-10-CM | POA: Diagnosis not present

## 2022-10-17 DIAGNOSIS — I2510 Atherosclerotic heart disease of native coronary artery without angina pectoris: Secondary | ICD-10-CM | POA: Diagnosis not present

## 2022-10-17 MED ORDER — LOSARTAN POTASSIUM 25 MG PO TABS: 25.0000 mg | ORAL_TABLET | Freq: Every day | ORAL | 2 refills | Status: DC

## 2022-10-17 NOTE — Patient Instructions (Signed)
Medication Instructions:  No changes *If you need a refill on your cardiac medications before your next appointment, please call your pharmacy*   Lab Work: None ordered If you have labs (blood work) drawn today and your tests are completely normal, you will receive your results only by: [place] (if you have MyChart) OR A paper copy in the mail If you have any lab test that is abnormal or we need to change your treatment, we will call you to review the results.   Testing/Procedures: None ordered   Follow-Up: At [facility], you and your health needs are our priority.  As part of our continuing mission to provide you with exceptional heart care, we have created designated Provider Care Teams.  These Care Teams include your primary Cardiologist (physician) and Advanced Practice Providers (APPs -  Physician Assistants and Nurse Practitioners) who all work together to provide you with the care you need, when you need it.  We recommend signing up for the patient portal called "MyChart".  Sign up information is provided on this After Visit Summary.  MyChart is used to connect with patients for Virtual Visits (Telemedicine).  Patients are able to view lab/test results, encounter notes, upcoming appointments, etc.  Non-urgent messages can be sent to your provider as well.   To learn more about what you can do with MyChart, go to [email or web address].    Your next appointment:   6 month(s)  Provider:   You may see Kathlyn [place], MD or one of the following Advanced Practice Providers on your designated Care Team:   Murray Hodgkins, NP Christell Faith, PA-C Cadence Kathlen Mody, PA-C Gerrie Nordmann, NP

## 2022-10-17 NOTE — Progress Notes (Signed)
Cardiology Office Note   Date:  10/17/2022   ID:  RIHAM Pittman, DOB [birth year], MRN [id number]  PCP:  [name], MD  Cardiologist:   Kathlyn Sacramento, MD   Chief Complaint  Patient presents with   Other    6 month f/u no complaints today. Meds reviewed verbally with pt.      History of Present Illness: Jeanne Pittman is a 88 y.o. female who presents for a followup visit regarding coronary artery disease and chronic systolic heart failure due to ischemic cardiomyopathy. She had anterior ST elevation myocardial infarction in December 2013 with late presentation complicated by cardiogenic shock. Emergent cardiac catheterization showed an occluded mid LAD and 95% stenosis in the proximal RCA. 2 drug-eluting stents were placed in the mid LAD and 1 drug-eluting stent to the proximal RCA. Ejection fraction was 25-30% with akinesis of the mid distal anterior, apical and distal inferior wall.  She has known history of non-Hodgkin's lymphoma. She has known history of myalgia with atorvastatin but she has been tolerating rosuvastatin. She was hospitalized in May 2021 with diverticulitis and improved with treatment.  She did have A. fib with RVR on presentation but converted to sinus rhythm.    Most recent echocardiogram in March 2022 showed an EF of 35 to 40% with mild mitral regurgitation and moderate tricuspid regurgitation.  Outpatient ZIO monitor showed sinus rhythm with short runs of SVT.  Average heart rate was 73 bpm.  No evidence of atrial fibrillation.   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    She has been doing well with no chest pain or shortness of breath.  Her appetite improved and she gained 10 pounds.  She reports improvement in fatigue as well.  She ran out of losartan 1 week ago.   Past Medical History:  Diagnosis Date   A-fib ([place])    07/2012: A. fib with RVR in the setting of myocardial infarction. Converted to sinus rhythm with  amiodarone. No recurrence.   Anxiety    Atrophic vaginitis    Chronic combined systolic (congestive) and diastolic (congestive) heart failure ([place])    a. EF was 25-35% post MI but improved to 45-50% in 04/2013; b. 03/2017 Echo: EF 40-45%, mod focal/basal hypertrophy of septum. Sev mid-apicalanteroseptal, ant, and apical HK. Gr1 DD, mild AI/MR, mod TR, PASP 53mg.   Colon adenomas    Coronary artery disease    a. [id number] complicated by cardiogenic shock. Cath/PCI: LAD 1052mDESx2), RCA 95p(DES). EF 25%; b. 03/2017 MV: EF 57%, fixed apical, periapical, mid to distal anteroseptal defect. No ischemia.   Eczema    Fibrocystic breast disease    Gastritis    GERD (gastroesophageal reflux disease)    Glaucoma    Headache    Hyperlipidemia    Hypertension    Hypothyroidism    Insomnia    Ischemic cardiomyopathy    a. 07/2012 EF 25-35% following MI-->Improved to 45-50%;  b. 03/2017 Echo: EF 40-45%.   [place]   Non Hodgkin's lymphoma ([facility]   reoccurance 2007 and 2012   Osteoarthritis    Osteoporosis    Polyneuropathy    Polyposis of colon    Restless leg syndrome    Shingles    right   Urinary, incontinence, stress female     Past Surgical History:  Procedure Laterality Date   [place]  NODE BIOPSY Left 03/18/2015   Procedure: AXILLARY LYMPH NODE BIOPSY;  Surgeon: [name], MD;  Location: [name];  Service: General;  Laterality: Left;   CARDIAC CATHETERIZATION  08/19/2012   [name]; Vash Quezada   COLONOSCOPY     COLONOSCOPY WITH PROPOFOL N/A 03/02/2016   Procedure: COLONOSCOPY WITH PROPOFOL;  Surgeon: Manya Silvas, MD;  Location: [facility] ENDOSCOPY;  Service: Endoscopy;  Laterality: N/A;   COLONOSCOPY WITH PROPOFOL N/A 09/26/2017   Procedure: COLONOSCOPY WITH PROPOFOL;  Surgeon: Manya Silvas, MD;  Location: [facility] ENDOSCOPY;  Service: Endoscopy;  Laterality: N/A;   COLONOSCOPY WITH  PROPOFOL N/A 03/11/2020   Procedure: COLONOSCOPY WITH PROPOFOL;  Surgeon: Benjamine Sprague, DO;  Location: [name] ENDOSCOPY;  Service: General;  Laterality: N/A;   CORONARY ANGIOPLASTY WITH STENT PLACEMENT     x3 stents   CORONARY ANGIOPLASTY WITH STENT PLACEMENT     CORONARY ARTERY BYPASS GRAFT     ESOPHAGOGASTRODUODENOSCOPY (EGD) WITH PROPOFOL N/A 03/02/2016   Procedure: ESOPHAGOGASTRODUODENOSCOPY (EGD) WITH PROPOFOL;  Surgeon: Manya Silvas, MD;  Location: [facility] ENDOSCOPY;  Service: Endoscopy;  Laterality: N/A;   ESOPHAGOGASTRODUODENOSCOPY (EGD) WITH PROPOFOL N/A 04/14/2019   Procedure: ESOPHAGOGASTRODUODENOSCOPY (EGD) WITH PROPOFOL;  Surgeon: [place], Benay Pike, MD;  Location: [name] ENDOSCOPY;  Service: Gastroenterology;  Laterality: N/A;   ESOPHAGOGASTRODUODENOSCOPY (EGD) WITH PROPOFOL N/A 12/07/2020   Procedure: ESOPHAGOGASTRODUODENOSCOPY (EGD) WITH PROPOFOL;  Surgeon: Lesly Rubenstein, MD;  Location: [name] ENDOSCOPY;  Service: Endoscopy;  Laterality: N/A;   LYMPH NODE BIOPSY Right 07/24/2011   Dr Bary Castilla   MOHS SURGERY     bilateral shoulders   MOHS SURGERY Right 02/27/2022   Squamous cell   PTCA     skin cancer removal     TOTAL ABDOMINAL HYSTERECTOMY W/ BILATERAL SALPINGOOPHORECTOMY       Current Outpatient Medications  Medication Sig Dispense Refill   acetaminophen (TYLENOL) 650 MG CR tablet Take 650 mg by mouth every 8 (eight) hours as needed for pain.     aspirin 81 MG tablet Take 81 mg by mouth every morning.      carvedilol (COREG) 6.25 MG tablet TAKE 1 TABLET BY MOUTH TWICE A DAY W FOOD 180 tablet 0   Cholecalciferol (VITAMIN D-3) 5000 UNITS TABS Take 2,000 Int'l Units by mouth every morning.      DULoxetine (CYMBALTA) 20 MG capsule Take 20 mg by mouth daily.     fexofenadine (ALLEGRA) 180 MG tablet Take 180 mg by mouth daily as needed for rhinitis.     ibuprofen (ADVIL) 800 MG tablet Take 800 mg by mouth every 6 (six) hours as needed.     latanoprost (XALATAN) 0.005 %  ophthalmic solution Place 1 drop into both eyes at bedtime.     LORazepam (ATIVAN) 0.5 MG tablet Take 0.5 mg by mouth every 8 (eight) hours as needed for anxiety.     pantoprazole (PROTONIX) 40 MG tablet Take 1 tablet (40 mg total) by mouth 2 (two) times daily. 180 tablet 3   rosuvastatin (CRESTOR) 10 MG tablet TAKE 1 TABLET BY MOUTH EVERY OTHER DAY 45 tablet 1   timolol (TIMOPTIC) 0.5 % ophthalmic solution Place 1 drop into both eyes 2 (two) times daily.     traMADol (ULTRAM) 50 MG tablet Take by mouth as needed.     losartan (COZAAR) 25 MG tablet TAKE 1 TABLET BY MOUTH DAILY. (Patient not taking: Reported on 10/17/2022) 90 tablet 2   No current facility-administered medications for this visit.    Allergies:  Ace inhibitors, Benadryl [diphenhydramine hcl], Codeine, Diphenhydramine, Guaiacol, Hydrocodone, Cardizem [diltiazem], and Guaifenesin & derivatives    Social History:  The patient  reports that she quit smoking about 74 years ago. Her smoking use included cigarettes. She has never used smokeless tobacco. She reports that she does not drink alcohol and does not use drugs.   Family History:  The patient's family history includes Heart attack in her father; Heart disease in her brother; Leukemia in her mother.    ROS:  Please see the history of present illness.   Otherwise, review of systems are positive for none.   All other systems are reviewed and negative.    PHYSICAL EXAM: VS:  BP (!) 140/70 (BP Location: Left Arm, Patient Position: Sitting, Cuff Size: Normal)   Pulse 63   Ht '5\' 1"'$  (1.549 m)   Wt 102 lb 4 oz (46.4 kg)   SpO2 96%   BMI 19.32 kg/m  , BMI Body mass index is 19.32 kg/m. GEN: Well nourished, well developed, in no acute distress  HEENT: normal  Neck: no JVD, carotid bruits, or masses Cardiac: RRR; no murmurs, rubs, or gallops,no edema  Respiratory:  clear to auscultation bilaterally, normal work of breathing GI: soft, nontender, nondistended, + BS MS: no  deformity or atrophy  Skin: warm and dry, no rash Neuro:  Strength and sensation are intact Psych: euthymic mood, full affect   EKG:  EKG is ordered today. The ekg ordered today demonstrates normal sinus rhythm with prior anterior infarct and left axis deviation.    Recent Labs: 07/17/2022: ALT 12; BUN 26; Creatinine, Ser 0.96; Hemoglobin 12.9; Platelets 187; Potassium 4.3; Sodium 135    Lipid Panel    Component Value Date/Time   CHOL 172 09/02/2015 0824   TRIG 111 09/02/2015 0824   HDL 51 09/02/2015 0824   CHOLHDL 3.4 09/02/2015 0824   LDLCALC 99 09/02/2015 0824      Wt Readings from Last 3 Encounters:  10/17/22 102 lb 4 oz (46.4 kg)  07/17/22 96 lb 6.4 oz (43.7 kg)  04/11/22 92 lb 8 oz (42 kg)        ASSESSMENT AND PLAN:  1.  Coronary artery disease involving native coronary arteries without angina: She is doing reasonably well from a cardiac standpoint with no anginal symptoms.  Continue medical therapy.  2. Hyperlipidemia: Continue treatment with rosuvastatin.  I reviewed most recent lipid profile done in March which showed an LDL of 56 and triglyceride of 97.  3.  Chronic systolic heart failure due to ischemic cardiomyopathy: She continues to be euvolemic with diuretics.  I refilled losartan.  Continue carvedilol.  4.  Paroxysmal atrial fibrillation: Brief episodes of A. fib in the setting of acute illness.  ZIO monitor showed no evidence of atrial fibrillation.  No need for anticoagulation.    5.  GERD: Continue pantoprazole.    Disposition:   FU with me in 6 months  Signed,  Kathlyn Sacramento, MD  10/17/2022 2:01 PM    [facility]

## 2022-10-18 ENCOUNTER — Encounter: Payer: Self-pay | Admitting: Cardiovascular Disease

## 2022-10-18 NOTE — Telephone Encounter (Signed)
error 

## 2022-10-22 ENCOUNTER — Other Ambulatory Visit: Payer: Self-pay | Admitting: Cardiovascular Disease

## 2022-10-27 ENCOUNTER — Other Ambulatory Visit: Payer: Self-pay | Admitting: Cardiovascular Disease

## 2022-12-26 ENCOUNTER — Other Ambulatory Visit: Payer: Self-pay | Admitting: Cardiovascular Disease

## 2022-12-26 NOTE — Telephone Encounter (Signed)
Last visit 10/17/2022--GERD: Continue pantoprazole.--Disposition:   FU with me in 6 months  next visit none

## 2023-01-08 ENCOUNTER — Ambulatory Visit
Admission: RE | Admit: 2023-01-08 | Discharge: 2023-01-08 | Disposition: A | Discharge status: Home / Self Care | Payer: Medicare Other | Source: Ambulatory Visit | Attending: Internal Medicine | Admitting: Internal Medicine

## 2023-01-08 DIAGNOSIS — R591 Generalized enlarged lymph nodes: Secondary | ICD-10-CM | POA: Insufficient documentation

## 2023-01-08 DIAGNOSIS — I700 Atherosclerosis of aorta: Secondary | ICD-10-CM | POA: Diagnosis not present

## 2023-01-08 DIAGNOSIS — R918 Other nonspecific abnormal finding of lung field: Secondary | ICD-10-CM | POA: Insufficient documentation

## 2023-01-08 DIAGNOSIS — C8304 Small cell B-cell lymphoma, lymph nodes of axilla and upper limb: Secondary | ICD-10-CM

## 2023-01-08 LAB — GLUCOSE, CAPILLARY: Glucose-Capillary: 85 mg/dL (ref 70–99)

## 2023-01-08 MED ORDER — FLUDEOXYGLUCOSE F - 18 (FDG) INJECTION
5.7500 | Freq: Once | INTRAVENOUS | Status: AC | PRN
  Administered 2023-01-08: 5.75 via INTRAVENOUS

## 2023-01-16 ENCOUNTER — Encounter: Payer: Self-pay | Admitting: Internal Medicine

## 2023-01-16 ENCOUNTER — Inpatient Hospital Stay: Payer: Medicare Other | Attending: Internal Medicine

## 2023-01-16 ENCOUNTER — Telehealth: Payer: Self-pay | Admitting: Cardiovascular Disease

## 2023-01-16 ENCOUNTER — Inpatient Hospital Stay (HOSPITAL_BASED_OUTPATIENT_CLINIC_OR_DEPARTMENT_OTHER): Payer: Medicare Other | Admitting: Internal Medicine

## 2023-01-16 VITALS — Ht 61.0 in

## 2023-01-16 DIAGNOSIS — R634 Abnormal weight loss: Secondary | ICD-10-CM | POA: Diagnosis not present

## 2023-01-16 DIAGNOSIS — I252 Old myocardial infarction: Secondary | ICD-10-CM | POA: Insufficient documentation

## 2023-01-16 DIAGNOSIS — Z8572 Personal history of non-Hodgkin lymphomas: Secondary | ICD-10-CM | POA: Insufficient documentation

## 2023-01-16 DIAGNOSIS — I110 Hypertensive heart disease with heart failure: Secondary | ICD-10-CM | POA: Insufficient documentation

## 2023-01-16 DIAGNOSIS — Z8719 Personal history of other diseases of the digestive system: Secondary | ICD-10-CM | POA: Insufficient documentation

## 2023-01-16 DIAGNOSIS — Z8249 Family history of ischemic heart disease and other diseases of the circulatory system: Secondary | ICD-10-CM | POA: Insufficient documentation

## 2023-01-16 DIAGNOSIS — M549 Dorsalgia, unspecified: Secondary | ICD-10-CM | POA: Insufficient documentation

## 2023-01-16 DIAGNOSIS — M2550 Pain in unspecified joint: Secondary | ICD-10-CM | POA: Insufficient documentation

## 2023-01-16 DIAGNOSIS — Z888 Allergy status to other drugs, medicaments and biological substances status: Secondary | ICD-10-CM | POA: Diagnosis not present

## 2023-01-16 DIAGNOSIS — Z86010 Personal history of colonic polyps: Secondary | ICD-10-CM | POA: Insufficient documentation

## 2023-01-16 DIAGNOSIS — Z885 Allergy status to narcotic agent status: Secondary | ICD-10-CM | POA: Insufficient documentation

## 2023-01-16 DIAGNOSIS — I2510 Atherosclerotic heart disease of native coronary artery without angina pectoris: Secondary | ICD-10-CM | POA: Diagnosis not present

## 2023-01-16 DIAGNOSIS — Z90710 Acquired absence of both cervix and uterus: Secondary | ICD-10-CM | POA: Diagnosis not present

## 2023-01-16 DIAGNOSIS — Z9221 Personal history of antineoplastic chemotherapy: Secondary | ICD-10-CM | POA: Insufficient documentation

## 2023-01-16 DIAGNOSIS — I5042 Chronic combined systolic (congestive) and diastolic (congestive) heart failure: Secondary | ICD-10-CM | POA: Insufficient documentation

## 2023-01-16 DIAGNOSIS — I480 Paroxysmal atrial fibrillation: Secondary | ICD-10-CM

## 2023-01-16 DIAGNOSIS — Z9049 Acquired absence of other specified parts of digestive tract: Secondary | ICD-10-CM | POA: Diagnosis not present

## 2023-01-16 DIAGNOSIS — C8304 Small cell B-cell lymphoma, lymph nodes of axilla and upper limb: Secondary | ICD-10-CM

## 2023-01-16 DIAGNOSIS — Z79899 Other long term (current) drug therapy: Secondary | ICD-10-CM | POA: Insufficient documentation

## 2023-01-16 DIAGNOSIS — Z90722 Acquired absence of ovaries, bilateral: Secondary | ICD-10-CM | POA: Diagnosis not present

## 2023-01-16 DIAGNOSIS — R5383 Other fatigue: Secondary | ICD-10-CM | POA: Insufficient documentation

## 2023-01-16 DIAGNOSIS — C8308 Small cell B-cell lymphoma, lymph nodes of multiple sites: Secondary | ICD-10-CM | POA: Insufficient documentation

## 2023-01-16 DIAGNOSIS — Z87891 Personal history of nicotine dependence: Secondary | ICD-10-CM | POA: Diagnosis not present

## 2023-01-16 DIAGNOSIS — R61 Generalized hyperhidrosis: Secondary | ICD-10-CM | POA: Diagnosis not present

## 2023-01-16 DIAGNOSIS — Z806 Family history of leukemia: Secondary | ICD-10-CM | POA: Insufficient documentation

## 2023-01-16 LAB — CBC WITH DIFFERENTIAL/PLATELET
Abs Immature Granulocytes: 0.01 10*3/uL (ref 0.00–0.07)
Basophils Absolute: 0 10*3/uL (ref 0.0–0.1)
Basophils Relative: 1 %
Eosinophils Absolute: 0.1 10*3/uL (ref 0.0–0.5)
Eosinophils Relative: 2 %
HCT: 34.2 % — ABNORMAL LOW (ref 36.0–46.0)
Hemoglobin: 11.4 g/dL — ABNORMAL LOW (ref 12.0–15.0)
Immature Granulocytes: 0 %
Lymphocytes Relative: 55 %
Lymphs Abs: 4.1 10*3/uL — ABNORMAL HIGH (ref 0.7–4.0)
MCH: 32.4 pg (ref 26.0–34.0)
MCHC: 33.3 g/dL (ref 30.0–36.0)
MCV: 97.2 fL (ref 80.0–100.0)
Monocytes Absolute: 0.4 10*3/uL (ref 0.1–1.0)
Monocytes Relative: 6 %
Neutro Abs: 2.6 10*3/uL (ref 1.7–7.7)
Neutrophils Relative %: 36 %
Platelets: 155 10*3/uL (ref 150–400)
RBC: 3.52 MIL/uL — ABNORMAL LOW (ref 3.87–5.11)
RDW: 14.2 % (ref 11.5–15.5)
WBC: 7.3 10*3/uL (ref 4.0–10.5)
nRBC: 0 % (ref 0.0–0.2)

## 2023-01-16 LAB — COMPREHENSIVE METABOLIC PANEL WITH GFR
ALT: 11 U/L (ref 0–44)
AST: 15 U/L (ref 15–41)
Albumin: 3.8 g/dL (ref 3.5–5.0)
Alkaline Phosphatase: 57 U/L (ref 38–126)
Anion gap: 9 (ref 5–15)
BUN: 23 mg/dL (ref 8–23)
CO2: 25 mmol/L (ref 22–32)
Calcium: 8.8 mg/dL — ABNORMAL LOW (ref 8.9–10.3)
Chloride: 102 mmol/L (ref 98–111)
Creatinine, Ser: 0.91 mg/dL (ref 0.44–1.00)
GFR, Estimated: >60 mL/min
Glucose, Bld: 104 mg/dL — ABNORMAL HIGH (ref 70–99)
Potassium: 4 mmol/L (ref 3.5–5.1)
Sodium: 136 mmol/L (ref 135–145)
Total Bilirubin: 0.5 mg/dL (ref 0.3–1.2)
Total Protein: 6.7 g/dL (ref 6.5–8.1)

## 2023-01-16 LAB — LACTATE DEHYDROGENASE: LDH: 108 U/L (ref 98–192)

## 2023-01-16 NOTE — Assessment & Plan Note (Addendum)
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 Status post multiple lines of therapy in the past; October 2022 finished -s/p Rituxan weely x4.  Jan 11, 2023-  Progressive bilateral axillary and subpectoral lymphadenopathy ([place] 4);  Small bilateral cervical lymph nodes with low level hypermetabolism ([place] 3); Enlarging retroperitoneal and pelvic lymph nodes ([place] 4); Right inguinal lymph nodes with low level hypermetabolism.  No findings for osseous lymphoma.  # Small bilateral pulmonary nodules with mild hypermetabolism. Indeterminate finding but possibly intrapulmonary lymph nodes. Will monitor.  # It is unclear if patient is symptomatic from her underlying lymphoma versus other causes like cardiac-causing fatigue.  I discussed the multiple treatment options available for patient lymphoma including-continue surveillance versus retreatment with rituximab weekly x 4.  I would not consider any other further aggressive options-given patient's age and also frail status.  # Fatigue: ? Lymphoma vs other- cardiac [Dr.Arida; Feb 2024]- recommend re-evaluation with cardiology.  Repeating a 2D echo; if clinically stable from cardiac standpoint we will proceed with rituximab weekly next visit.  # Weight loss: UNlikley from Lymphoma [given improvement of PET scan AUG 2023].s/p Nutrition; [place]- stable.   # Post herpetic neurlagia/Itch- recommend pramoxine topical- stable.   #Incidental findings on Imaging  CT , 2024: Stable advanced atherosclerotic calcifications involving the thoracic and abdominal aorta and coronary arteries.  I reviewed/discussed/counseled the patient.   Rituxamab is ordered.  # DISPOSITION:  # Follow up in 2 months- MD labs- cbc/cmp/LDH;iron studies; ferritin-  Dr.B  # I reviewed the blood work- with the patient in detail; also reviewed the imaging independently [as summarized above]; and with the patient in detail.

## 2023-01-16 NOTE — Progress Notes (Signed)
DISCONTINUE ON PATHWAY REGIMEN - Lymphoma and CLL     Ramp-up cycle = 35 days:     Venetoclax      Venetoclax      Venetoclax      Venetoclax      Venetoclax    Cycle 1 through 24 = every 28 days:     Venetoclax      Rituximab-xxxx      Rituximab-xxxx   **Always confirm dose/schedule in your pharmacy ordering system**  REASON: Other Reason PRIOR TREATMENT: [id number]: Venetoclax 400 mg Daily for up to 2 Years (Plus Initial 5-Week Dose Ramp-Up) + Rituximab 375/500 mg/m2 IV q28 Days x 6 Cycles TREATMENT RESPONSE: Progressive Disease (PD)  START ON PATHWAY REGIMEN - Lymphoma and CLL     A cycle is every 7 days:     Rituximab-xxxx   **Always confirm dose/schedule in your pharmacy ordering system**  Patient Characteristics: Follicular Lymphoma, Grades 1, 2, and 3A, First Line, Stage III / IV, Symptomatic or Bulky Disease Disease Type: Follicular Lymphoma, Grade 1, 2, or 3A Disease Type: Not Applicable Disease Type: Not Applicable Line of Therapy: First Line Disease Characteristics: Symptomatic or Bulky Disease Intent of Therapy: Non-Curative / Palliative Intent, Discussed with Patient

## 2023-01-16 NOTE — Telephone Encounter (Signed)
Patient's son is calling stating that Dr. Donneta Romberg from the [facility] is requesting patient have a full cardiac work-up, including an echo test. Requesting call back to discuss.

## 2023-01-16 NOTE — Progress Notes (Signed)
Pt has night sweats all the time. Denies pain. No swollen lymph nodes.Denies fevers. Appetite is some better 25%. Stays fatigued all the time. States she has bruising in her extremities but doesn' t recall hitting against anything.

## 2023-01-16 NOTE — Progress Notes (Signed)
[facility] OFFICE PROGRESS NOTE  Patient Care Team: Marguarite Arbour, MD as PCP - General (Unknown Physician Specialty) Iran Ouch, MD as PCP - Cardiology (Cardiology) Byrnett, Merrily Pew, MD (General Surgery) Johney Maine, MD (Oncology) Iran Ouch, MD as Consulting Physician (Cardiology) Earna Coder, MD as Consulting Physician (Hematology and Oncology)   Cancer Staging  No matching staging information was found for the patient.    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  #May 24, 2021-rituximab weekly x4; FEB 2023- PET significant partial response.  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  Accompanied by son.  Sandi Carne 88 y.o.  female pleasant patient above history of recurrent low-grade lymphoma/B cell non-Hodgkin-currently s/p weekly rituximab is here for follow-up; and review results of the PET scan.  Patient continues to have ongoing fatigue.  Also complains of ongoing night sweats every night.  Otherwise no new cough.  Notes to have gained weight s/p evaluation with nutrition.  Overall weight is stable.   Review of Systems  Constitutional:  Positive for malaise/fatigue and weight loss. Negative for chills, diaphoresis and fever.  HENT:  Negative for nosebleeds and sore throat.   Eyes:  Negative for double vision.  Respiratory:  Negative for cough, hemoptysis, sputum production, shortness of breath and wheezing.   Cardiovascular:  Negative for chest pain, palpitations, orthopnea and leg swelling.  Gastrointestinal:  Negative for abdominal pain, blood in stool, constipation, diarrhea, heartburn, melena, nausea and vomiting.  Genitourinary:  Negative for dysuria, frequency and urgency.  Musculoskeletal:  Positive for back pain and joint pain.  Skin: Negative.  Negative for itching and rash.  Neurological:  Negative for dizziness, tingling, focal weakness, weakness and headaches.  Endo/Heme/Allergies:  Does not bruise/bleed easily.  Psychiatric/Behavioral:  Negative for depression. The patient is not nervous/anxious and does not have insomnia.     PAST MEDICAL HISTORY :  Past Medical History:  Diagnosis Date   A-fib (HCC)    07/2012: A. fib with RVR in the setting of myocardial infarction. Converted to sinus rhythm with amiodarone. No recurrence.   Anxiety    Atrophic vaginitis    Chronic combined systolic (congestive)  and diastolic (congestive) heart failure (HCC)    a. EF was 25-35% post MI but improved to 45-50% in 04/2013; b. 03/2017 Echo: EF 40-45%, mod focal/basal  hypertrophy of septum. Sev mid-apicalanteroseptal, ant, and apical HK. Gr1 DD, mild AI/MR, mod TR, PASP .   Colon adenomas    Coronary artery disease    a. 07/2012 STEMI complicated by cardiogenic shock. Cath/PCI: LAD 166m (DESx2), RCA 95p(DES). EF 25%; b. 03/2017 MV: EF 57%, fixed apical, periapical, mid to distal anteroseptal defect. No ischemia.   Eczema    Fibrocystic breast disease    Gastritis    GERD (gastroesophageal reflux disease)    Glaucoma    Headache    Hyperlipidemia    Hypertension    Hypothyroidism    Insomnia    Ischemic cardiomyopathy    a. 07/2012 EF 25-35% following MI-->Improved to 45-50%;  b. 03/2017 Echo: EF 40-45%.   Myocardial infarction (HCC) 08/18/2014   Non Hodgkin's lymphoma (HCC) 2005   reoccurance 2007 and 2012   Osteoarthritis    Osteoporosis    Polyneuropathy    Polyposis of colon    Restless leg syndrome    Shingles    right   Urinary, incontinence, stress female     PAST SURGICAL HISTORY :   Past Surgical History:  Procedure Laterality Date   ABDOMINAL HYSTERECTOMY  1956   APPENDECTOMY     AXILLARY LYMPH NODE BIOPSY Left 03/18/2015   Procedure: AXILLARY LYMPH NODE BIOPSY;  Surgeon: Earline Mayotte, MD;  Location: [name];  Service: General;  Laterality: Left;   CARDIAC CATHETERIZATION  08/19/2012   [name]; [facility]   COLONOSCOPY     COLONOSCOPY WITH PROPOFOL N/A 03/02/2016   Procedure: COLONOSCOPY WITH PROPOFOL;  Surgeon: Scot Jun, MD;  Location: [facility] ENDOSCOPY;  Service: Endoscopy;  Laterality: N/A;   COLONOSCOPY WITH PROPOFOL N/A 09/26/2017   Procedure: COLONOSCOPY WITH PROPOFOL;  Surgeon: Scot Jun, MD;  Location: [facility] ENDOSCOPY;  Service: Endoscopy;  Laterality: N/A;   COLONOSCOPY WITH PROPOFOL N/A 03/11/2020   Procedure: COLONOSCOPY WITH PROPOFOL;  Surgeon: Sung Amabile, DO;  Location: [name] ENDOSCOPY;  Service: General;  Laterality: N/A;   CORONARY ANGIOPLASTY WITH STENT PLACEMENT     x3 stents   CORONARY  ANGIOPLASTY WITH STENT PLACEMENT     CORONARY ARTERY BYPASS GRAFT     ESOPHAGOGASTRODUODENOSCOPY (EGD) WITH PROPOFOL N/A 03/02/2016   Procedure: ESOPHAGOGASTRODUODENOSCOPY (EGD) WITH PROPOFOL;  Surgeon: Scot Jun, MD;  Location: [facility] ENDOSCOPY;  Service: Endoscopy;  Laterality: N/A;   ESOPHAGOGASTRODUODENOSCOPY (EGD) WITH PROPOFOL N/A 04/14/2019   Procedure: ESOPHAGOGASTRODUODENOSCOPY (EGD) WITH PROPOFOL;  Surgeon: Toledo, Boykin Nearing, MD;  Location: [name] ENDOSCOPY;  Service: Gastroenterology;  Laterality: N/A;   ESOPHAGOGASTRODUODENOSCOPY (EGD) WITH PROPOFOL N/A 12/07/2020   Procedure: ESOPHAGOGASTRODUODENOSCOPY (EGD) WITH PROPOFOL;  Surgeon: Regis Bill, MD;  Location: [name] ENDOSCOPY;  Service: Endoscopy;  Laterality: N/A;   LYMPH NODE BIOPSY Right 07/24/2011   Dr Lemar Livings   MOHS SURGERY     bilateral shoulders   MOHS SURGERY Right 02/27/2022   Squamous cell   PTCA     skin cancer removal     TOTAL ABDOMINAL HYSTERECTOMY W/ BILATERAL SALPINGOOPHORECTOMY      FAMILY HISTORY :   Family History  Problem Relation Age of Onset   Heart attack Father    Heart disease Brother    Leukemia Mother    Bladder Cancer Neg Hx    Prostate cancer Neg Hx    Kidney cancer Neg  Hx    Breast cancer Neg Hx     SOCIAL HISTORY:   Social History   Tobacco Use   Smoking status: Former    Types: Cigarettes    Quit date: 1950    Years since quitting: 74.4   Smokeless tobacco: Never  Vaping Use   Vaping Use: Never used  Substance Use Topics   Alcohol use: No    Alcohol/week: 0.0 standard drinks of alcohol   Drug use: No    ALLERGIES:  is allergic to ace inhibitors, benadryl [diphenhydramine hcl], codeine, diphenhydramine, guaiacol, hydrocodone, cardizem [diltiazem], and guaifenesin & derivatives.  MEDICATIONS:  Current Outpatient Medications  Medication Sig Dispense Refill   aspirin 81 MG tablet Take 81 mg by mouth every morning.      carvedilol (COREG) 6.25 MG tablet Take 1  tablet (6.25 mg total) by mouth 2 (two) times daily with a meal. 180 tablet 1   Cholecalciferol (VITAMIN D-3) 5000 UNITS TABS Take 2,000 Int'l Units by mouth every morning.      DULoxetine (CYMBALTA) 20 MG capsule Take 20 mg by mouth daily.     fexofenadine (ALLEGRA) 180 MG tablet Take 180 mg by mouth daily as needed for rhinitis.     ibuprofen (ADVIL) 800 MG tablet Take 800 mg by mouth every 6 (six) hours as needed.     latanoprost (XALATAN) 0.005 % ophthalmic solution Place 1 drop into both eyes at bedtime.     LORazepam (ATIVAN) 0.5 MG tablet Take 0.5 mg by mouth every 8 (eight) hours as needed for anxiety.     losartan (COZAAR) 25 MG tablet Take 1 tablet (25 mg total) by mouth daily. 90 tablet 2   pantoprazole (PROTONIX) 40 MG tablet TAKE 1 TABLET BY MOUTH TWICE A DAY 180 tablet 1   rosuvastatin (CRESTOR) 10 MG tablet TAKE 1 TABLET BY MOUTH EVERY OTHER DAY 45 tablet 1   timolol (TIMOPTIC) 0.5 % ophthalmic solution Place 1 drop into both eyes 2 (two) times daily.     traMADol (ULTRAM) 50 MG tablet Take by mouth as needed.     acetaminophen (TYLENOL) 650 MG CR tablet Take 650 mg by mouth every 8 (eight) hours as needed for pain.     No current facility-administered medications for this visit.    PHYSICAL EXAMINATION: ECOG PERFORMANCE STATUS: 0 - Asymptomatic  Ht 5\' 1"  (1.549 m)   BMI 19.32 kg/m   There were no vitals filed for this visit.  Excoriation/skin rash associated with itching on the abdomen posterior/right side.  But no evidence of active shingles.  No lymphadenopathy noted.  Physical Exam HENT:     Head: Normocephalic and atraumatic.     Mouth/Throat:     Pharynx: No oropharyngeal exudate.  Eyes:     Pupils: Pupils are equal, round, and reactive to light.  Cardiovascular:     Rate and Rhythm: Normal rate and regular rhythm.  Pulmonary:     Effort: No respiratory distress.     Breath sounds: No wheezing.  Abdominal:     General: Bowel sounds are normal. There is  no distension.     Palpations: Abdomen is soft. There is no mass.     Tenderness: There is no abdominal tenderness. There is no guarding or rebound.  Musculoskeletal:        General: No tenderness. Normal range of motion.     Cervical back: Normal range of motion and neck supple.  Skin:    General: Skin is warm.  Neurological:     Mental Status: She is alert and oriented to person, place, and time.  Psychiatric:        Mood and Affect: Affect normal.      LABORATORY DATA:  I have reviewed the data as listed    Component Value Date/Time   NA 136 01/16/2023 1304   NA 141 09/07/2014 1449   K 4.0 01/16/2023 1304   K 4.0 09/07/2014 1449   CL 102 01/16/2023 1304   CL 106 09/07/2014 1449   CO2 25 01/16/2023 1304   CO2 29 09/07/2014 1449   GLUCOSE 104 (H) 01/16/2023 1304   GLUCOSE 109 (H) 09/07/2014 1449   BUN 23 01/16/2023 1304   BUN 19 (H) 09/07/2014 1449   CREATININE 0.91 01/16/2023 1304   CREATININE 1.01 09/07/2014 1449   CALCIUM 8.8 (L) 01/16/2023 1304   CALCIUM 8.8 09/07/2014 1449   PROT 6.7 01/16/2023 1304   PROT 6.3 09/02/2015 0824   PROT 6.5 09/07/2014 1449   ALBUMIN 3.8 01/16/2023 1304   ALBUMIN 4.2 09/02/2015 0824   ALBUMIN 3.8 09/07/2014 1449   AST 15 01/16/2023 1304   AST 11 (L) 09/07/2014 1449   ALT 11 01/16/2023 1304   ALT 22 09/07/2014 1449   ALKPHOS 57 01/16/2023 1304   ALKPHOS 75 09/07/2014 1449   BILITOT 0.5 01/16/2023 1304   BILITOT 0.4 09/02/2015 0824   BILITOT 0.8 09/07/2014 1449   GFRNONAA >60 01/16/2023 1304   GFRNONAA 56 (L) 09/07/2014 1449   GFRNONAA 44 (L) 02/09/2014 1351   GFRAA 59 (L) 02/27/2020 1023   GFRAA >60 09/07/2014 1449   GFRAA 51 (L) 02/09/2014 1351    No results found for: "SPEP", "UPEP"  Lab Results  Component Value Date   WBC 7.3 01/16/2023   NEUTROABS 2.6 01/16/2023   HGB 11.4 (L) 01/16/2023   HCT 34.2 (L) 01/16/2023   MCV 97.2 01/16/2023   PLT 155 01/16/2023      Chemistry      Component Value Date/Time   NA  136 01/16/2023 1304   NA 141 09/07/2014 1449   K 4.0 01/16/2023 1304   K 4.0 09/07/2014 1449   CL 102 01/16/2023 1304   CL 106 09/07/2014 1449   CO2 25 01/16/2023 1304   CO2 29 09/07/2014 1449   BUN 23 01/16/2023 1304   BUN 19 (H) 09/07/2014 1449   CREATININE 0.91 01/16/2023 1304   CREATININE 1.01 09/07/2014 1449      Component Value Date/Time   CALCIUM 8.8 (L) 01/16/2023 1304   CALCIUM 8.8 09/07/2014 1449   ALKPHOS 57 01/16/2023 1304   ALKPHOS 75 09/07/2014 1449   AST 15 01/16/2023 1304   AST 11 (L) 09/07/2014 1449   ALT 11 01/16/2023 1304   ALT 22 09/07/2014 1449   BILITOT 0.5 01/16/2023 1304   BILITOT 0.4 09/02/2015 0824   BILITOT 0.8 09/07/2014 1449      RADIOGRAPHIC STUDIES: I have personally reviewed the radiological images as listed and agreed with the findings in the report. No results found.   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 Status post multiple lines of therapy in the past; October 2022 finished -s/p Rituxan weely x4.  Jan 11, 2023-  Progressive bilateral axillary and subpectoral lymphadenopathy ([place] 4);  Small bilateral cervical lymph nodes with low level hypermetabolism ([place] 3); Enlarging retroperitoneal and pelvic lymph nodes ([place] 4); Right inguinal lymph nodes  with low level hypermetabolism.  No findings for osseous lymphoma.  # Small bilateral pulmonary nodules with mild hypermetabolism. Indeterminate finding but possibly intrapulmonary lymph nodes. Will monitor.  # It is unclear if patient is symptomatic from her underlying lymphoma versus other causes like cardiac-causing fatigue.  I discussed the multiple treatment options available for patient lymphoma including-continue surveillance versus retreatment with rituximab weekly x 4.  I would not consider any other further aggressive options-given patient's age  and also frail status.  # Fatigue: ? Lymphoma vs other- cardiac [Dr.Arida; Feb 2024]- recommend re-evaluation with cardiology.  Repeating a 2D echo; if clinically stable from cardiac standpoint we will proceed with rituximab weekly next visit.  # Weight loss: UNlikley from Lymphoma [given improvement of PET scan AUG 2023].s/p Nutrition; Joli- stable.   # Post herpetic neurlagia/Itch- recommend pramoxine topical- stable.   #Incidental findings on Imaging  CT , 2024: Stable advanced atherosclerotic calcifications involving the thoracic and abdominal aorta and coronary arteries.  I reviewed/discussed/counseled the patient.   Rituxamab is ordered.  # DISPOSITION:  # Follow up in 2 months- MD labs- cbc/cmp/LDH;iron studies; ferritin-  Dr.B  # I reviewed the blood work- with the patient in detail; also reviewed the imaging independently [as summarized above]; and with the patient in detail.           Orders Placed This Encounter  Procedures   Hepatitis B core antibody, IgM    Standing Status:   Future    Standing Expiration Date:   01/16/2024   Hepatitis B surface antigen    Standing Status:   Future    Standing Expiration Date:   01/16/2024   Hepatitis C antibody    Standing Status:   Future    Standing Expiration Date:   01/16/2024   CBC with Differential (Cancer Center Only)    Standing Status:   Future    Standing Expiration Date:   01/16/2024   CMP (Cancer Center only)    Standing Status:   Future    Standing Expiration Date:   01/16/2024   Lactate dehydrogenase    Standing Status:   Future    Standing Expiration Date:   01/16/2024   Iron and TIBC    Standing Status:   Future    Standing Expiration Date:   01/16/2024   Ferritin    Standing Status:   Future    Standing Expiration Date:   01/16/2024     All questions were answered. The patient knows to call the clinic with any problems, questions or concerns.      Earna Coder, MD 01/16/2023 2:31 PM

## 2023-01-18 ENCOUNTER — Other Ambulatory Visit: Payer: Self-pay

## 2023-01-18 ENCOUNTER — Ambulatory Visit: Payer: Medicare Other | Admitting: Internal Medicine

## 2023-01-18 ENCOUNTER — Other Ambulatory Visit: Payer: Medicare Other

## 2023-01-18 NOTE — Telephone Encounter (Signed)
Son called again to follow-up on getting patient full cardiac evaluation and echo test ordered on Monday 6/10 as patient is due to start treatment on 6/17.

## 2023-01-19 ENCOUNTER — Telehealth: Payer: Self-pay | Admitting: *Deleted

## 2023-01-19 NOTE — Telephone Encounter (Signed)
Called the son back to offer a 6/5 appointment for an echo in [place]. He is unable to bring the patient that day.  An appointment was made for 6/18 at [facility]. The son will call back to make an appointment with Dr. Kirke Corin or APP around that same date.

## 2023-01-19 NOTE — Telephone Encounter (Signed)
Schedule an urgent echo and follow-up with APP after.

## 2023-01-19 NOTE — Telephone Encounter (Signed)
Call from son stating that patient is ready to proceed with Rituxan treatment and would like to start Monday 6/24. She has already contacted Dr Kirke Corin and is scheduled for an ECHO 02/06/23

## 2023-01-22 ENCOUNTER — Other Ambulatory Visit: Payer: Self-pay | Admitting: Internal Medicine

## 2023-01-22 ENCOUNTER — Other Ambulatory Visit: Payer: Self-pay | Admitting: *Deleted

## 2023-01-22 ENCOUNTER — Encounter: Payer: Self-pay | Admitting: Internal Medicine

## 2023-01-22 ENCOUNTER — Telehealth: Payer: Self-pay | Admitting: Internal Medicine

## 2023-01-22 DIAGNOSIS — C8308 Small cell B-cell lymphoma, lymph nodes of multiple sites: Secondary | ICD-10-CM

## 2023-01-22 NOTE — Progress Notes (Signed)
As per patient request-  # Move up just appts/ labs to June 24th-ADD- rituximab infusion  # July 1st- rituximab infusion-  GB

## 2023-01-22 NOTE — Telephone Encounter (Signed)
This patients son called requesting patient to start rituxan. Please call him about setting this up to start.

## 2023-01-24 ENCOUNTER — Other Ambulatory Visit (HOSPITAL_COMMUNITY): Payer: Medicare Other

## 2023-01-31 ENCOUNTER — Other Ambulatory Visit: Payer: Self-pay

## 2023-02-06 ENCOUNTER — Ambulatory Visit (HOSPITAL_COMMUNITY)
Admission: RE | Admit: 2023-02-06 | Discharge: 2023-02-06 | Disposition: A | Discharge status: Home / Self Care | Payer: Medicare Other | Source: Ambulatory Visit | Attending: Cardiovascular Disease | Admitting: Cardiovascular Disease

## 2023-02-06 DIAGNOSIS — E785 Hyperlipidemia, unspecified: Secondary | ICD-10-CM | POA: Insufficient documentation

## 2023-02-06 DIAGNOSIS — I255 Ischemic cardiomyopathy: Secondary | ICD-10-CM | POA: Insufficient documentation

## 2023-02-06 DIAGNOSIS — I252 Old myocardial infarction: Secondary | ICD-10-CM | POA: Diagnosis not present

## 2023-02-06 DIAGNOSIS — I083 Combined rheumatic disorders of mitral, aortic and tricuspid valves: Secondary | ICD-10-CM | POA: Insufficient documentation

## 2023-02-06 DIAGNOSIS — I480 Paroxysmal atrial fibrillation: Secondary | ICD-10-CM | POA: Insufficient documentation

## 2023-02-06 DIAGNOSIS — I509 Heart failure, unspecified: Secondary | ICD-10-CM | POA: Insufficient documentation

## 2023-02-06 DIAGNOSIS — I2510 Atherosclerotic heart disease of native coronary artery without angina pectoris: Secondary | ICD-10-CM | POA: Diagnosis not present

## 2023-02-06 DIAGNOSIS — I4891 Unspecified atrial fibrillation: Secondary | ICD-10-CM | POA: Diagnosis not present

## 2023-02-06 DIAGNOSIS — I110 Hypertensive heart disease with heart failure: Secondary | ICD-10-CM | POA: Insufficient documentation

## 2023-02-06 LAB — ECHOCARDIOGRAM COMPLETE
Area-P 1/2: 3.53 cm2
Calc EF: 39.1 %
Est EF: 40
P 1/2 time: 480 msec
Radius: 0.3 cm
S' Lateral: 2.6 cm
Single Plane A2C EF: 38.1 %
Single Plane A4C EF: 37.6 %

## 2023-02-06 MED ORDER — PERFLUTREN LIPID MICROSPHERE
1.0000 mL | INTRAVENOUS | Status: AC | PRN
  Administered 2023-02-06: 7 mL via INTRAVENOUS

## 2023-02-06 NOTE — Progress Notes (Signed)
Echocardiogram 2D Echocardiogram has been performed.  Augustine Radar 02/06/2023, 12:33 PM

## 2023-02-12 ENCOUNTER — Encounter: Payer: Self-pay | Admitting: Internal Medicine

## 2023-02-12 ENCOUNTER — Inpatient Hospital Stay: Payer: Medicare Other

## 2023-02-12 ENCOUNTER — Inpatient Hospital Stay (HOSPITAL_BASED_OUTPATIENT_CLINIC_OR_DEPARTMENT_OTHER): Payer: Medicare Other | Admitting: Internal Medicine

## 2023-02-12 ENCOUNTER — Inpatient Hospital Stay: Payer: Medicare Other | Attending: Internal Medicine

## 2023-02-12 VITALS — BP 122/61 | HR 90 | Temp 96.0°F

## 2023-02-12 VITALS — BP 123/58 | HR 65 | Temp 96.8°F | Ht 61.0 in | Wt 103.8 lb

## 2023-02-12 DIAGNOSIS — Z79899 Other long term (current) drug therapy: Secondary | ICD-10-CM | POA: Diagnosis not present

## 2023-02-12 DIAGNOSIS — I2510 Atherosclerotic heart disease of native coronary artery without angina pectoris: Secondary | ICD-10-CM | POA: Diagnosis not present

## 2023-02-12 DIAGNOSIS — Z79620 Long term (current) use of immunosuppressive biologic: Secondary | ICD-10-CM | POA: Insufficient documentation

## 2023-02-12 DIAGNOSIS — Z5112 Encounter for antineoplastic immunotherapy: Secondary | ICD-10-CM | POA: Insufficient documentation

## 2023-02-12 DIAGNOSIS — R5383 Other fatigue: Secondary | ICD-10-CM | POA: Insufficient documentation

## 2023-02-12 DIAGNOSIS — C8308 Small cell B-cell lymphoma, lymph nodes of multiple sites: Secondary | ICD-10-CM | POA: Insufficient documentation

## 2023-02-12 DIAGNOSIS — Z8719 Personal history of other diseases of the digestive system: Secondary | ICD-10-CM | POA: Diagnosis not present

## 2023-02-12 DIAGNOSIS — M549 Dorsalgia, unspecified: Secondary | ICD-10-CM | POA: Insufficient documentation

## 2023-02-12 DIAGNOSIS — R918 Other nonspecific abnormal finding of lung field: Secondary | ICD-10-CM | POA: Diagnosis not present

## 2023-02-12 DIAGNOSIS — I5042 Chronic combined systolic (congestive) and diastolic (congestive) heart failure: Secondary | ICD-10-CM | POA: Diagnosis not present

## 2023-02-12 DIAGNOSIS — C8304 Small cell B-cell lymphoma, lymph nodes of axilla and upper limb: Secondary | ICD-10-CM | POA: Diagnosis not present

## 2023-02-12 DIAGNOSIS — Z885 Allergy status to narcotic agent status: Secondary | ICD-10-CM | POA: Insufficient documentation

## 2023-02-12 DIAGNOSIS — Z9049 Acquired absence of other specified parts of digestive tract: Secondary | ICD-10-CM | POA: Insufficient documentation

## 2023-02-12 DIAGNOSIS — M2550 Pain in unspecified joint: Secondary | ICD-10-CM | POA: Diagnosis not present

## 2023-02-12 DIAGNOSIS — Z87891 Personal history of nicotine dependence: Secondary | ICD-10-CM | POA: Insufficient documentation

## 2023-02-12 DIAGNOSIS — Z8249 Family history of ischemic heart disease and other diseases of the circulatory system: Secondary | ICD-10-CM | POA: Diagnosis not present

## 2023-02-12 DIAGNOSIS — Z86010 Personal history of colonic polyps: Secondary | ICD-10-CM | POA: Insufficient documentation

## 2023-02-12 DIAGNOSIS — R634 Abnormal weight loss: Secondary | ICD-10-CM | POA: Diagnosis not present

## 2023-02-12 DIAGNOSIS — I252 Old myocardial infarction: Secondary | ICD-10-CM | POA: Diagnosis not present

## 2023-02-12 DIAGNOSIS — Z888 Allergy status to other drugs, medicaments and biological substances status: Secondary | ICD-10-CM | POA: Diagnosis not present

## 2023-02-12 DIAGNOSIS — Z806 Family history of leukemia: Secondary | ICD-10-CM | POA: Diagnosis not present

## 2023-02-12 DIAGNOSIS — Z90710 Acquired absence of both cervix and uterus: Secondary | ICD-10-CM | POA: Diagnosis not present

## 2023-02-12 DIAGNOSIS — R61 Generalized hyperhidrosis: Secondary | ICD-10-CM | POA: Diagnosis not present

## 2023-02-12 DIAGNOSIS — Z90722 Acquired absence of ovaries, bilateral: Secondary | ICD-10-CM | POA: Insufficient documentation

## 2023-02-12 LAB — CBC WITH DIFFERENTIAL (CANCER CENTER ONLY)
Abs Immature Granulocytes: 0.02 10*3/uL (ref 0.00–0.07)
Basophils Absolute: 0.1 10*3/uL (ref 0.0–0.1)
Basophils Relative: 1 %
Eosinophils Absolute: 0.1 10*3/uL (ref 0.0–0.5)
Eosinophils Relative: 2 %
HCT: 34.9 % — ABNORMAL LOW (ref 36.0–46.0)
Hemoglobin: 11.7 g/dL — ABNORMAL LOW (ref 12.0–15.0)
Immature Granulocytes: 0 %
Lymphocytes Relative: 52 %
Lymphs Abs: 3.2 10*3/uL (ref 0.7–4.0)
MCH: 32.8 pg (ref 26.0–34.0)
MCHC: 33.5 g/dL (ref 30.0–36.0)
MCV: 97.8 fL (ref 80.0–100.0)
Monocytes Absolute: 0.4 10*3/uL (ref 0.1–1.0)
Monocytes Relative: 6 %
Neutro Abs: 2.4 10*3/uL (ref 1.7–7.7)
Neutrophils Relative %: 39 %
Platelet Count: 142 10*3/uL — ABNORMAL LOW (ref 150–400)
RBC: 3.57 MIL/uL — ABNORMAL LOW (ref 3.87–5.11)
RDW: 13.8 % (ref 11.5–15.5)
WBC Count: 6.1 10*3/uL (ref 4.0–10.5)
nRBC: 0 % (ref 0.0–0.2)

## 2023-02-12 LAB — IRON AND TIBC
Iron: 81 ug/dL (ref 28–170)
Saturation Ratios: 20 % (ref 10.4–31.8)
TIBC: 396 ug/dL (ref 250–450)
UIBC: 315 ug/dL

## 2023-02-12 LAB — CMP (CANCER CENTER ONLY)
ALT: 9 U/L (ref 0–44)
AST: 14 U/L — ABNORMAL LOW (ref 15–41)
Albumin: 3.9 g/dL (ref 3.5–5.0)
Alkaline Phosphatase: 57 U/L (ref 38–126)
Anion gap: 7 (ref 5–15)
BUN: 35 mg/dL — ABNORMAL HIGH (ref 8–23)
CO2: 26 mmol/L (ref 22–32)
Calcium: 8.9 mg/dL (ref 8.9–10.3)
Chloride: 107 mmol/L (ref 98–111)
Creatinine: 1.06 mg/dL — ABNORMAL HIGH (ref 0.44–1.00)
GFR, Estimated: 51 mL/min — ABNORMAL LOW (ref >60)
Glucose, Bld: 89 mg/dL (ref 70–99)
Potassium: 3.7 mmol/L (ref 3.5–5.1)
Sodium: 140 mmol/L (ref 135–145)
Total Bilirubin: 0.8 mg/dL (ref 0.3–1.2)
Total Protein: 6.4 g/dL — ABNORMAL LOW (ref 6.5–8.1)

## 2023-02-12 LAB — HEPATITIS B CORE ANTIBODY, IGM: Hep B C IgM: NONREACTIVE

## 2023-02-12 LAB — HEPATITIS C ANTIBODY: HCV Ab: NONREACTIVE

## 2023-02-12 LAB — HEPATITIS B SURFACE ANTIGEN: Hepatitis B Surface Ag: NONREACTIVE

## 2023-02-12 LAB — LACTATE DEHYDROGENASE: LDH: 104 U/L (ref 98–192)

## 2023-02-12 LAB — FERRITIN: Ferritin: 8 ng/mL — ABNORMAL LOW (ref 11–307)

## 2023-02-12 MED ORDER — SODIUM CHLORIDE 0.9 % IV SOLN
375.0000 mg/m2 | Freq: Once | INTRAVENOUS | Status: AC
  Administered 2023-02-12: 500 mg via INTRAVENOUS
  Filled 2023-02-12: qty 50

## 2023-02-12 MED ORDER — SODIUM CHLORIDE 0.9 % IV SOLN
Freq: Once | INTRAVENOUS | Status: AC
  Filled 2023-02-12: qty 250

## 2023-02-12 MED ORDER — ACETAMINOPHEN 325 MG PO TABS
650.0000 mg | ORAL_TABLET | Freq: Once | ORAL | Status: AC
  Administered 2023-02-12: 650 mg via ORAL
  Filled 2023-02-12: qty 2

## 2023-02-12 MED ORDER — DIPHENHYDRAMINE HCL 25 MG PO CAPS
50.0000 mg | ORAL_CAPSULE | Freq: Once | ORAL | Status: AC
  Administered 2023-02-12: 50 mg via ORAL
  Filled 2023-02-12: qty 2

## 2023-02-12 NOTE — Patient Instructions (Signed)
Rituximab Injection What is this medication? RITUXIMAB (ri TUX i mab) treats leukemia and lymphoma. It works by blocking a protein that causes cancer cells to grow and multiply. This helps to slow or stop the spread of cancer cells. It may also be used to treat autoimmune conditions, such as arthritis. It works by slowing down an overactive immune system. It is a monoclonal antibody. This medicine may be used for other purposes; ask your health care provider or pharmacist if you have questions. COMMON BRAND NAME(S): RIABNI, Rituxan, RUXIENCE, truxima What should I tell my care team before I take this medication? They need to know if you have any of these conditions: Chest pain Heart disease Immune system problems Infection, such as chickenpox, cold sores, hepatitis B, herpes Irregular heartbeat or rhythm Kidney disease Low blood counts, such as low white cells, platelets, red cells Lung disease Recent or upcoming vaccine An unusual or allergic reaction to rituximab, other medications, foods, dyes, or preservatives Pregnant or trying to get pregnant Breast-feeding How should I use this medication? This medication is injected into a vein. It is given by a care team in a hospital or clinic setting. A special MedGuide will be given to you before each treatment. Be sure to read this information carefully each time. Talk to your care team about the use of this medication in children. While this medication may be prescribed for children as young as 6 months for selected conditions, precautions do apply. Overdosage: If you think you have taken too much of this medicine contact a poison control center or emergency room at once. NOTE: This medicine is only for you. Do not share this medicine with others. What if I miss a dose? Keep appointments for follow-up doses. It is important not to miss your dose. Call your care team if you are unable to keep an appointment. What may interact with this  medication? Do not take this medication with any of the following: Live vaccines This medication may also interact with the following: Cisplatin This list may not describe all possible interactions. Give your health care provider a list of all the medicines, herbs, non-prescription drugs, or dietary supplements you use. Also tell them if you smoke, drink alcohol, or use illegal drugs. Some items may interact with your medicine. What should I watch for while using this medication? Your condition will be monitored carefully while you are receiving this medication. You may need blood work while taking this medication. This medication can cause serious infusion reactions. To reduce the risk your care team may give you other medications to take before receiving this one. Be sure to follow the directions from your care team. This medication may increase your risk of getting an infection. Call your care team for advice if you get a fever, chills, sore throat, or other symptoms of a cold or flu. Do not treat yourself. Try to avoid being around people who are sick. Call your care team if you are around anyone with measles, chickenpox, or if you develop sores or blisters that do not heal properly. Avoid taking medications that contain aspirin, acetaminophen, ibuprofen, naproxen, or ketoprofen unless instructed by your care team. These medications may hide a fever. This medication may cause serious skin reactions. They can happen weeks to months after starting the medication. Contact your care team right away if you notice fevers or flu-like symptoms with a rash. The rash may be red or purple and then turn into blisters or peeling of the skin.   You may also notice a red rash with swelling of the face, lips, or lymph nodes in your neck or under your arms. In some patients, this medication may cause a serious brain infection that may cause death. If you have any problems seeing, thinking, speaking, walking, or  standing, tell your care team right away. If you cannot reach your care team, urgently seek another source of medical care. Talk to your care team if you may be pregnant. Serious birth defects can occur if you take this medication during pregnancy and for 12 months after the last dose. You will need a negative pregnancy test before starting this medication. Contraception is recommended while taking this medication and for 12 months after the last dose. Your care team can help you find the option that works for you. Do not breastfeed while taking this medication and for at least 6 months after the last dose. What side effects may I notice from receiving this medication? Side effects that you should report to your care team as soon as possible: Allergic reactions or angioedema--skin rash, itching or hives, swelling of the face, eyes, lips, tongue, arms, or legs, trouble swallowing or breathing Bowel blockage--stomach cramping, unable to have a bowel movement or pass gas, loss of appetite, vomiting Dizziness, loss of balance or coordination, confusion or trouble speaking Heart attack--pain or tightness in the chest, shoulders, arms, or jaw, nausea, shortness of breath, cold or clammy skin, feeling faint or lightheaded Heart rhythm changes--fast or irregular heartbeat, dizziness, feeling faint or lightheaded, chest pain, trouble breathing Infection--fever, chills, cough, sore throat, wounds that don't heal, pain or trouble when passing urine, general feeling of discomfort or being unwell Infusion reactions--chest pain, shortness of breath or trouble breathing, feeling faint or lightheaded Kidney injury--decrease in the amount of urine, swelling of the ankles, hands, or feet Liver injury--right upper belly pain, loss of appetite, nausea, light-colored stool, dark yellow or brown urine, yellowing skin or eyes, unusual weakness or fatigue Redness, blistering, peeling, or loosening of the skin, including  inside the mouth Stomach pain that is severe, does not go away, or gets worse Tumor lysis syndrome (TLS)--nausea, vomiting, diarrhea, decrease in the amount of urine, dark urine, unusual weakness or fatigue, confusion, muscle pain or cramps, fast or irregular heartbeat, joint pain Side effects that usually do not require medical attention (report to your care team if they continue or are bothersome): Headache Joint pain Nausea Runny or stuffy nose Unusual weakness or fatigue This list may not describe all possible side effects. Call your doctor for medical advice about side effects. You may report side effects to FDA at 1-800-FDA-1088. Where should I keep my medication? This medication is given in a hospital or clinic. It will not be stored at home. NOTE: This sheet is a summary. It may not cover all possible information. If you have questions about this medicine, talk to your doctor, pharmacist, or health care provider.  2024 Elsevier/Gold Standard (2021-12-29 00:00:00)  

## 2023-02-12 NOTE — Progress Notes (Signed)
No concerns today 

## 2023-02-12 NOTE — Assessment & Plan Note (Addendum)
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 Status post multiple lines of therapy in the past; October 2022 finished -s/p Rituxan weely x4.  Jan 11, 2023-  Progressive bilateral axillary and subpectoral lymphadenopathy (Deauville 4);  Small bilateral cervical lymph nodes with low level hypermetabolism ; Enlarging retroperitoneal and pelvic lymph nodes (Deauville 4); Right inguinal lymph nodes with low level hypermetabolism.  No findings for osseous lymphoma.  Symptomatic given worsening fatigue/poor appetite/ night sweats.  # Proceed with cycle #1 of rituximab weekly infusion x 4. Labs-CBC/chemistries were reviewed with the patient/son.  Hepatitis panel pending.  Will change treatment to subcu starting cycle #2.  Discussed with pharmacy.  # PET scan- May 2024- Small bilateral pulmonary nodules with mild hypermetabolism. Indeterminate finding but possibly intrapulmonary lymph nodes. Will monitor-will repeat imaging again in 3 months also.  # Fatigue: ? Lymphoma vs other- cardiac [Dr.Arida; Feb 2024]-status post re-evaluation with cardiology;  March 08, 2023 2D echo pending.  Monitor closely.  # Weight loss: UNlikley from Lymphoma [given improvement of PET scan AUG 2023].s/p Nutrition; Joli- stable.   # Post herpetic neurlagia/Itch- recommend pramoxine topical- stable.  # DISPOSITION:  # Rituxamab today;  # in 1 week- Dr.A; labs- cbc/bmp; Rituxan SQ As as per IS- in 2 week/ and 3 weeks- SQ Rituxan; no labs.  # Follow up in 2 months- MD labs- cbc/cmp/LDH;   Dr.B

## 2023-02-12 NOTE — Progress Notes (Signed)
[facility] OFFICE PROGRESS NOTE  Patient Care Team: Marguarite Arbour, MD as PCP - General (Unknown Physician Specialty) Iran Ouch, MD as PCP - Cardiology (Cardiology) Byrnett, Merrily Pew, MD (General Surgery) Johney Maine, MD (Oncology) Iran Ouch, MD as Consulting Physician (Cardiology) Earna Coder, MD as Consulting Physician (Hematology and Oncology)   Cancer Staging  No matching staging information was found for the patient.    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  #May 24, 2021-rituximab weekly x4; FEB 2023- PET significant partial response.  #MAY 2024- PET progression- June 24th, 2024-  cycle #1 of rituximab weekly infusion x 4.  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  Accompanied by son.  Jeanne Pittman 88 y.o.  female pleasant patient above history of recurrent low-grade lymphoma/B cell non-Hodgkin-noted to have progression on imaging/ PET scan May 2024 is here to proceed with rituximab infusion.  Patient continues to have ongoing fatigue.  Also complains of ongoing night sweats every night.  Otherwise no new cough.  Notes to have gained weight s/p evaluation with nutrition.  Overall weight is stable.  Review of Systems  Constitutional:  Positive for malaise/fatigue and weight loss. Negative for chills, diaphoresis and fever.  HENT:  Negative for nosebleeds and sore throat.   Eyes:  Negative for double vision.  Respiratory:  Negative for cough, hemoptysis, sputum production, shortness of breath and wheezing.   Cardiovascular:  Negative for chest pain, palpitations, orthopnea and leg swelling.  Gastrointestinal:  Negative for abdominal pain, blood in stool, constipation, diarrhea, heartburn, melena, nausea and vomiting.  Genitourinary:  Negative for dysuria, frequency and urgency.  Musculoskeletal:  Positive for back pain and joint pain.  Skin: Negative.  Negative for itching and rash.  Neurological:  Negative for dizziness, tingling, focal weakness, weakness and headaches.  Endo/Heme/Allergies:  Does not bruise/bleed easily.  Psychiatric/Behavioral:  Negative for depression. The patient is not nervous/anxious and does not have insomnia.     PAST MEDICAL HISTORY :  Past Medical History:  Diagnosis Date   A-fib (HCC)    07/2012: A. fib with RVR in the setting of myocardial infarction. Converted to sinus rhythm with  amiodarone. No recurrence.   Anxiety    Atrophic vaginitis    Chronic combined systolic (congestive) and diastolic (congestive) heart failure (HCC)    a. EF was  25-35% post MI but improved to 45-50% in 04/2013; b. 03/2017 Echo: EF 40-45%, mod focal/basal hypertrophy of septum. Sev mid-apicalanteroseptal, ant, and apical HK. Gr1 DD, mild AI/MR, mod TR, PASP .   Colon adenomas    Coronary artery disease    a. 07/2012 STEMI complicated by cardiogenic shock. Cath/PCI: LAD 17m (DESx2), RCA 95p(DES). EF 25%; b. 03/2017 MV: EF 57%, fixed apical, periapical, mid to distal anteroseptal defect. No ischemia.   Eczema    Fibrocystic breast disease    Gastritis    GERD (gastroesophageal reflux disease)    Glaucoma    Headache    Hyperlipidemia    Hypertension    Hypothyroidism    Insomnia    Ischemic cardiomyopathy    a. 07/2012 EF 25-35% following MI-->Improved to 45-50%;  b. 03/2017 Echo: EF 40-45%.   Myocardial infarction (HCC) 08/18/2014   Non Hodgkin's lymphoma (HCC) 2005   reoccurance 2007 and 2012   Osteoarthritis    Osteoporosis    Polyneuropathy    Polyposis of colon    Restless leg syndrome    Shingles    right   Urinary, incontinence, stress female     PAST SURGICAL HISTORY :   Past Surgical History:  Procedure Laterality Date   ABDOMINAL HYSTERECTOMY  1956   APPENDECTOMY     AXILLARY LYMPH NODE BIOPSY Left 03/18/2015   Procedure: AXILLARY LYMPH NODE BIOPSY;  Surgeon: Earline Mayotte, MD;  Location: [name];  Service: General;  Laterality: Left;   CARDIAC CATHETERIZATION  08/19/2012   [name]; [facility]   COLONOSCOPY     COLONOSCOPY WITH PROPOFOL N/A 03/02/2016   Procedure: COLONOSCOPY WITH PROPOFOL;  Surgeon: Scot Jun, MD;  Location: [facility] ENDOSCOPY;  Service: Endoscopy;  Laterality: N/A;   COLONOSCOPY WITH PROPOFOL N/A 09/26/2017   Procedure: COLONOSCOPY WITH PROPOFOL;  Surgeon: Scot Jun, MD;  Location: [facility] ENDOSCOPY;  Service: Endoscopy;  Laterality: N/A;   COLONOSCOPY WITH PROPOFOL N/A 03/11/2020   Procedure: COLONOSCOPY WITH PROPOFOL;  Surgeon: Sung Amabile, DO;  Location: [name] ENDOSCOPY;  Service: General;   Laterality: N/A;   CORONARY ANGIOPLASTY WITH STENT PLACEMENT     x3 stents   CORONARY ANGIOPLASTY WITH STENT PLACEMENT     CORONARY ARTERY BYPASS GRAFT     ESOPHAGOGASTRODUODENOSCOPY (EGD) WITH PROPOFOL N/A 03/02/2016   Procedure: ESOPHAGOGASTRODUODENOSCOPY (EGD) WITH PROPOFOL;  Surgeon: Scot Jun, MD;  Location: [facility] ENDOSCOPY;  Service: Endoscopy;  Laterality: N/A;   ESOPHAGOGASTRODUODENOSCOPY (EGD) WITH PROPOFOL N/A 04/14/2019   Procedure: ESOPHAGOGASTRODUODENOSCOPY (EGD) WITH PROPOFOL;  Surgeon: Toledo, Boykin Nearing, MD;  Location: [name] ENDOSCOPY;  Service: Gastroenterology;  Laterality: N/A;   ESOPHAGOGASTRODUODENOSCOPY (EGD) WITH PROPOFOL N/A 12/07/2020   Procedure: ESOPHAGOGASTRODUODENOSCOPY (EGD) WITH PROPOFOL;  Surgeon: Regis Bill, MD;  Location: [name] ENDOSCOPY;  Service: Endoscopy;  Laterality: N/A;   LYMPH NODE BIOPSY Right 07/24/2011   Dr Lemar Livings   MOHS SURGERY     bilateral shoulders   MOHS SURGERY Right 02/27/2022   Squamous cell   PTCA     skin cancer removal     TOTAL ABDOMINAL HYSTERECTOMY W/ BILATERAL SALPINGOOPHORECTOMY      FAMILY HISTORY :   Family History  Problem Relation Age of Onset   Heart attack Father    Heart disease Brother    Leukemia Mother  Bladder Cancer Neg Hx    Prostate cancer Neg Hx    Kidney cancer Neg Hx    Breast cancer Neg Hx     SOCIAL HISTORY:   Social History   Tobacco Use   Smoking status: Former    Types: Cigarettes    Quit date: 1950    Years since quitting: 74.5   Smokeless tobacco: Never  Vaping Use   Vaping Use: Never used  Substance Use Topics   Alcohol use: No    Alcohol/week: 0.0 standard drinks of alcohol   Drug use: No    ALLERGIES:  is allergic to ace inhibitors, benadryl [diphenhydramine hcl], codeine, diphenhydramine, guaiacol, hydrocodone, cardizem [diltiazem], and guaifenesin & derivatives.  MEDICATIONS:  Current Outpatient Medications  Medication Sig Dispense Refill   acetaminophen  (TYLENOL) 650 MG CR tablet Take 650 mg by mouth every 8 (eight) hours as needed for pain.     aspirin 81 MG tablet Take 81 mg by mouth every morning.      carvedilol (COREG) 6.25 MG tablet Take 1 tablet (6.25 mg total) by mouth 2 (two) times daily with a meal. 180 tablet 1   Cholecalciferol (VITAMIN D-3) 5000 UNITS TABS Take 2,000 Int'l Units by mouth every morning.      DULoxetine (CYMBALTA) 20 MG capsule Take 20 mg by mouth daily.     fexofenadine (ALLEGRA) 180 MG tablet Take 180 mg by mouth daily as needed for rhinitis.     ibuprofen (ADVIL) 800 MG tablet Take 800 mg by mouth every 6 (six) hours as needed.     latanoprost (XALATAN) 0.005 % ophthalmic solution Place 1 drop into both eyes at bedtime.     LORazepam (ATIVAN) 0.5 MG tablet Take 0.5 mg by mouth every 8 (eight) hours as needed for anxiety.     losartan (COZAAR) 25 MG tablet Take 1 tablet (25 mg total) by mouth daily. 90 tablet 2   pantoprazole (PROTONIX) 40 MG tablet TAKE 1 TABLET BY MOUTH TWICE A DAY 180 tablet 1   rosuvastatin (CRESTOR) 10 MG tablet TAKE 1 TABLET BY MOUTH EVERY OTHER DAY 45 tablet 1   timolol (TIMOPTIC) 0.5 % ophthalmic solution Place 1 drop into both eyes 2 (two) times daily.     traMADol (ULTRAM) 50 MG tablet Take by mouth as needed.     No current facility-administered medications for this visit.    PHYSICAL EXAMINATION: ECOG PERFORMANCE STATUS: 0 - Asymptomatic  BP (!) 123/58 (BP Location: Left Arm, Patient Position: Sitting, Cuff Size: Small)   Pulse 65   Temp (!) 96.8 F (36 C) (Tympanic)   Ht 5\' 1"  (1.549 m)   Wt 103 lb 12.8 oz (47.1 kg)   SpO2 100%   BMI 19.61 kg/m   Filed Weights   02/12/23 0757  Weight: 103 lb 12.8 oz (47.1 kg)    Excoriation/skin rash associated with itching on the abdomen posterior/right side.  But no evidence of active shingles.  No lymphadenopathy noted.  Physical Exam HENT:     Head: Normocephalic and atraumatic.     Mouth/Throat:     Pharynx: No oropharyngeal  exudate.  Eyes:     Pupils: Pupils are equal, round, and reactive to light.  Cardiovascular:     Rate and Rhythm: Normal rate and regular rhythm.  Pulmonary:     Effort: No respiratory distress.     Breath sounds: No wheezing.  Abdominal:     General: Bowel sounds are normal. There is no distension.  Palpations: Abdomen is soft. There is no mass.     Tenderness: There is no abdominal tenderness. There is no guarding or rebound.  Musculoskeletal:        General: No tenderness. Normal range of motion.     Cervical back: Normal range of motion and neck supple.  Skin:    General: Skin is warm.  Neurological:     Mental Status: She is alert and oriented to person, place, and time.  Psychiatric:        Mood and Affect: Affect normal.      LABORATORY DATA:  I have reviewed the data as listed    Component Value Date/Time   NA 140 02/12/2023 0802   NA 141 09/07/2014 1449   K 3.7 02/12/2023 0802   K 4.0 09/07/2014 1449   CL 107 02/12/2023 0802   CL 106 09/07/2014 1449   CO2 26 02/12/2023 0802   CO2 29 09/07/2014 1449   GLUCOSE 89 02/12/2023 0802   GLUCOSE 109 (H) 09/07/2014 1449   BUN 35 (H) 02/12/2023 0802   BUN 19 (H) 09/07/2014 1449   CREATININE 1.06 (H) 02/12/2023 0802   CREATININE 1.01 09/07/2014 1449   CALCIUM 8.9 02/12/2023 0802   CALCIUM 8.8 09/07/2014 1449   PROT 6.4 (L) 02/12/2023 0802   PROT 6.3 09/02/2015 0824   PROT 6.5 09/07/2014 1449   ALBUMIN 3.9 02/12/2023 0802   ALBUMIN 4.2 09/02/2015 0824   ALBUMIN 3.8 09/07/2014 1449   AST 14 (L) 02/12/2023 0802   ALT 9 02/12/2023 0802   ALT 22 09/07/2014 1449   ALKPHOS 57 02/12/2023 0802   ALKPHOS 75 09/07/2014 1449   BILITOT 0.8 02/12/2023 0802   GFRNONAA 51 (L) 02/12/2023 0802   GFRNONAA 56 (L) 09/07/2014 1449   GFRNONAA 44 (L) 02/09/2014 1351   GFRAA 59 (L) 02/27/2020 1023   GFRAA >60 09/07/2014 1449   GFRAA 51 (L) 02/09/2014 1351    No results found for: "SPEP", "UPEP"  Lab Results  Component Value  Date   WBC 6.1 02/12/2023   NEUTROABS 2.4 02/12/2023   HGB 11.7 (L) 02/12/2023   HCT 34.9 (L) 02/12/2023   MCV 97.8 02/12/2023   PLT 142 (L) 02/12/2023      Chemistry      Component Value Date/Time   NA 140 02/12/2023 0802   NA 141 09/07/2014 1449   K 3.7 02/12/2023 0802   K 4.0 09/07/2014 1449   CL 107 02/12/2023 0802   CL 106 09/07/2014 1449   CO2 26 02/12/2023 0802   CO2 29 09/07/2014 1449   BUN 35 (H) 02/12/2023 0802   BUN 19 (H) 09/07/2014 1449   CREATININE 1.06 (H) 02/12/2023 0802   CREATININE 1.01 09/07/2014 1449      Component Value Date/Time   CALCIUM 8.9 02/12/2023 0802   CALCIUM 8.8 09/07/2014 1449   ALKPHOS 57 02/12/2023 0802   ALKPHOS 75 09/07/2014 1449   AST 14 (L) 02/12/2023 0802   ALT 9 02/12/2023 0802   ALT 22 09/07/2014 1449   BILITOT 0.8 02/12/2023 0802      RADIOGRAPHIC STUDIES: I have personally reviewed the radiological images as listed and agreed with the findings in the report. No results found.   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 Status post multiple lines of therapy in the past; October 2022 finished -s/p Rituxan weely x4.  Jan 11, 2023-  Progressive  bilateral axillary and subpectoral lymphadenopathy (Deauville 4);  Small bilateral cervical lymph nodes with low level hypermetabolism ; Enlarging retroperitoneal and pelvic lymph nodes (Deauville 4); Right inguinal lymph nodes with low level hypermetabolism.  No findings for osseous lymphoma.  Symptomatic given worsening fatigue/poor appetite/ night sweats.  # Proceed with cycle #1 of rituximab weekly infusion x 4. Labs-CBC/chemistries were reviewed with the patient/son.  Hepatitis panel pending.  # PET scan- May 2024- Small bilateral pulmonary nodules with mild hypermetabolism. Indeterminate finding but possibly intrapulmonary lymph nodes. Will monitor-will  repeat imaging again in 3 months also.  # Fatigue: ? Lymphoma vs other- cardiac [Dr.Arida; Feb 2024]-status post re-evaluation with cardiology;  March 08, 2023 2D echo pending.  Monitor closely.  # Weight loss: UNlikley from Lymphoma [given improvement of PET scan AUG 2023].s/p Nutrition; Joli- stable.   # Post herpetic neurlagia/Itch- recommend pramoxine topical- stable.  # DISPOSITION:  # Rituxamab today;  # in 1 week- Dr.A; labs- cbc/bmp; Rituxan SQ As as per IS- in 2 week/ and 3 weeks- SQ Rituxan; no labs.  # Follow up in 2 months- MD labs- cbc/cmp/LDH;   Dr.B          Orders Placed This Encounter  Procedures   CBC with Differential (Cancer Center Only)    Standing Status:   Future    Standing Expiration Date:   02/12/2024   Basic metabolic panel    Standing Status:   Future    Standing Expiration Date:   02/12/2024   CBC with Differential (Cancer Center Only)    Standing Status:   Future    Standing Expiration Date:   02/12/2024   CMP (Cancer Center only)    Standing Status:   Future    Standing Expiration Date:   02/12/2024   Lactate dehydrogenase    Standing Status:   Future    Standing Expiration Date:   02/12/2024     All questions were answered. The patient knows to call the clinic with any problems, questions or concerns.      Earna Coder, MD 02/12/2023 9:09 AM

## 2023-02-13 ENCOUNTER — Telehealth: Payer: Self-pay | Admitting: Cardiovascular Disease

## 2023-02-13 NOTE — Telephone Encounter (Signed)
Attempted to reach the patient's son but was unable to leave a message. We will call him when the results are available.

## 2023-02-13 NOTE — Telephone Encounter (Signed)
Pt's son is requesting a callback regarding ECHO results after not hearing anything yet and wants to make sure everything is okay since pt stated Cancer treatment yesterday.

## 2023-02-13 NOTE — Telephone Encounter (Signed)
I sent you a result note

## 2023-02-13 NOTE — Telephone Encounter (Signed)
Left a message for the patient's son to call back.   Per Dr. Kirke Corin, Echo showed stable LV systolic function with an ejection fraction of 40% with normal function being 55 to 60%.  She has mild to moderate valvular abnormalities but nothing that requires intervention.

## 2023-02-15 NOTE — Telephone Encounter (Signed)
Patient's son has been made aware, per dpr.

## 2023-02-19 ENCOUNTER — Inpatient Hospital Stay: Payer: Medicare Other | Attending: Internal Medicine | Admitting: Internal Medicine

## 2023-02-19 ENCOUNTER — Inpatient Hospital Stay: Payer: Medicare Other

## 2023-02-19 ENCOUNTER — Inpatient Hospital Stay: Payer: Medicare Other | Admitting: Nurse Practitioner

## 2023-02-19 ENCOUNTER — Ambulatory Visit: Payer: Medicare Other

## 2023-02-19 VITALS — BP 132/53 | HR 60 | Temp 98.7°F

## 2023-02-19 VITALS — BP 129/50 | HR 66 | Temp 97.5°F | Wt 104.3 lb

## 2023-02-19 DIAGNOSIS — I2510 Atherosclerotic heart disease of native coronary artery without angina pectoris: Secondary | ICD-10-CM | POA: Insufficient documentation

## 2023-02-19 DIAGNOSIS — Z90722 Acquired absence of ovaries, bilateral: Secondary | ICD-10-CM | POA: Insufficient documentation

## 2023-02-19 DIAGNOSIS — Z5112 Encounter for antineoplastic immunotherapy: Secondary | ICD-10-CM | POA: Diagnosis present

## 2023-02-19 DIAGNOSIS — R5383 Other fatigue: Secondary | ICD-10-CM | POA: Diagnosis not present

## 2023-02-19 DIAGNOSIS — C8308 Small cell B-cell lymphoma, lymph nodes of multiple sites: Secondary | ICD-10-CM | POA: Insufficient documentation

## 2023-02-19 DIAGNOSIS — Z8719 Personal history of other diseases of the digestive system: Secondary | ICD-10-CM | POA: Diagnosis not present

## 2023-02-19 DIAGNOSIS — Z885 Allergy status to narcotic agent status: Secondary | ICD-10-CM | POA: Diagnosis not present

## 2023-02-19 DIAGNOSIS — M2550 Pain in unspecified joint: Secondary | ICD-10-CM | POA: Insufficient documentation

## 2023-02-19 DIAGNOSIS — I5042 Chronic combined systolic (congestive) and diastolic (congestive) heart failure: Secondary | ICD-10-CM | POA: Insufficient documentation

## 2023-02-19 DIAGNOSIS — E611 Iron deficiency: Secondary | ICD-10-CM | POA: Diagnosis not present

## 2023-02-19 DIAGNOSIS — Z86010 Personal history of colonic polyps: Secondary | ICD-10-CM | POA: Diagnosis not present

## 2023-02-19 DIAGNOSIS — Z87891 Personal history of nicotine dependence: Secondary | ICD-10-CM | POA: Insufficient documentation

## 2023-02-19 DIAGNOSIS — Z90710 Acquired absence of both cervix and uterus: Secondary | ICD-10-CM | POA: Diagnosis not present

## 2023-02-19 DIAGNOSIS — M549 Dorsalgia, unspecified: Secondary | ICD-10-CM | POA: Diagnosis not present

## 2023-02-19 DIAGNOSIS — Z888 Allergy status to other drugs, medicaments and biological substances status: Secondary | ICD-10-CM | POA: Diagnosis not present

## 2023-02-19 DIAGNOSIS — Z9049 Acquired absence of other specified parts of digestive tract: Secondary | ICD-10-CM | POA: Diagnosis not present

## 2023-02-19 DIAGNOSIS — K5900 Constipation, unspecified: Secondary | ICD-10-CM | POA: Diagnosis not present

## 2023-02-19 DIAGNOSIS — D509 Iron deficiency anemia, unspecified: Secondary | ICD-10-CM

## 2023-02-19 DIAGNOSIS — I252 Old myocardial infarction: Secondary | ICD-10-CM | POA: Diagnosis not present

## 2023-02-19 DIAGNOSIS — Z681 Body mass index (BMI) 19 or less, adult: Secondary | ICD-10-CM | POA: Insufficient documentation

## 2023-02-19 DIAGNOSIS — Z79899 Other long term (current) drug therapy: Secondary | ICD-10-CM | POA: Insufficient documentation

## 2023-02-19 DIAGNOSIS — C8304 Small cell B-cell lymphoma, lymph nodes of axilla and upper limb: Secondary | ICD-10-CM

## 2023-02-19 LAB — CBC WITH DIFFERENTIAL (CANCER CENTER ONLY)
Abs Immature Granulocytes: 0.02 10*3/uL (ref 0.00–0.07)
Basophils Absolute: 0 10*3/uL (ref 0.0–0.1)
Basophils Relative: 1 %
Eosinophils Absolute: 0.2 10*3/uL (ref 0.0–0.5)
Eosinophils Relative: 3 %
HCT: 35.4 % — ABNORMAL LOW (ref 36.0–46.0)
Hemoglobin: 11.9 g/dL — ABNORMAL LOW (ref 12.0–15.0)
Immature Granulocytes: 0 %
Lymphocytes Relative: 51 %
Lymphs Abs: 3.3 10*3/uL (ref 0.7–4.0)
MCH: 32.5 pg (ref 26.0–34.0)
MCHC: 33.6 g/dL (ref 30.0–36.0)
MCV: 96.7 fL (ref 80.0–100.0)
Monocytes Absolute: 0.4 10*3/uL (ref 0.1–1.0)
Monocytes Relative: 7 %
Neutro Abs: 2.4 10*3/uL (ref 1.7–7.7)
Neutrophils Relative %: 38 %
Platelet Count: 142 10*3/uL — ABNORMAL LOW (ref 150–400)
RBC: 3.66 MIL/uL — ABNORMAL LOW (ref 3.87–5.11)
RDW: 13.6 % (ref 11.5–15.5)
WBC Count: 6.3 10*3/uL (ref 4.0–10.5)
nRBC: 0 % (ref 0.0–0.2)

## 2023-02-19 LAB — BASIC METABOLIC PANEL
Anion gap: 7 (ref 5–15)
BUN: 26 mg/dL — ABNORMAL HIGH (ref 8–23)
CO2: 26 mmol/L (ref 22–32)
Calcium: 8.8 mg/dL — ABNORMAL LOW (ref 8.9–10.3)
Chloride: 105 mmol/L (ref 98–111)
Creatinine, Ser: 1.16 mg/dL — ABNORMAL HIGH (ref 0.44–1.00)
GFR, Estimated: 46 mL/min — ABNORMAL LOW (ref >60)
Glucose, Bld: 96 mg/dL (ref 70–99)
Potassium: 3.5 mmol/L (ref 3.5–5.1)
Sodium: 138 mmol/L (ref 135–145)

## 2023-02-19 MED ORDER — ACETAMINOPHEN 325 MG PO TABS
650.0000 mg | ORAL_TABLET | Freq: Once | ORAL | Status: AC
  Administered 2023-02-19: 650 mg via ORAL
  Filled 2023-02-19: qty 2

## 2023-02-19 MED ORDER — DIPHENHYDRAMINE HCL 25 MG PO CAPS
50.0000 mg | ORAL_CAPSULE | Freq: Once | ORAL | Status: AC
  Administered 2023-02-19: 50 mg via ORAL
  Filled 2023-02-19: qty 2

## 2023-02-19 MED ORDER — RITUXIMAB-HYALURONIDASE HUMAN 1400-23400 MG -UT/11.7ML ~~LOC~~ SOLN
1400.0000 mg | Freq: Once | SUBCUTANEOUS | Status: AC
  Administered 2023-02-19: 1400 mg via SUBCUTANEOUS
  Filled 2023-02-19: qty 11.7

## 2023-02-19 NOTE — Progress Notes (Signed)
[facility] OFFICE PROGRESS NOTE  Patient Care Team: Marguarite Arbour, MD as PCP - General (Unknown Physician Specialty) Iran Ouch, MD as PCP - Cardiology (Cardiology) Byrnett, Merrily Pew, MD (General Surgery) Johney Maine, MD (Oncology) Iran Ouch, MD as Consulting Physician (Cardiology) Earna Coder, MD as Consulting Physician (Hematology and Oncology)   Cancer Staging  No matching staging information was found for the patient.    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  #May 24, 2021-rituximab weekly x4; FEB 2023- PET significant partial response.  #MAY 2024- PET progression- June 24th, 2024-  cycle #1 of rituximab weekly infusion x 4.  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  Accompanied by son.  Jeanne Pittman 88 y.o.  female pleasant patient above history of recurrent low-grade lymphoma/B cell non-Hodgkin-noted to have progression on imaging/ PET scan May 2024 is here to proceed with rituximab infusion.  Patient reports fatigue.  Was inquiring about if it was okay to do subcu injections as she has always had IV treatments in the past. Denies any changes with the bowel movement, fever, chills, nausea or vomiting. Patient was inquiring about any medication for constipation.  She has used Colace in the past and will get some if needed.  Review of Systems  Constitutional:  Positive for malaise/fatigue and weight loss. Negative for chills, diaphoresis and fever.  HENT:  Negative for nosebleeds and sore throat.   Eyes:  Negative for double vision.  Respiratory:  Negative for cough, hemoptysis, sputum production, shortness of breath and wheezing.   Cardiovascular:  Negative for chest pain, palpitations, orthopnea and leg swelling.  Gastrointestinal:  Negative for abdominal pain, blood in stool, constipation, diarrhea, heartburn, melena, nausea and vomiting.  Genitourinary:  Negative for dysuria, frequency and urgency.  Musculoskeletal:  Positive for back pain and joint pain.  Skin: Negative.  Negative for itching and rash.  Neurological:  Negative for dizziness, tingling, focal weakness, weakness and headaches.  Endo/Heme/Allergies:  Does not bruise/bleed easily.  Psychiatric/Behavioral:  Negative for depression. The patient is not nervous/anxious and does not have insomnia.     PAST MEDICAL HISTORY :  Past Medical History:  Diagnosis  Date   A-fib (HCC)    07/2012: A. fib with RVR in the setting of myocardial infarction. Converted to sinus rhythm with amiodarone. No recurrence.   Anxiety     Atrophic vaginitis    Chronic combined systolic (congestive) and diastolic (congestive) heart failure (HCC)    a. EF was 25-35% post MI but improved to 45-50% in 04/2013; b. 03/2017 Echo: EF 40-45%, mod focal/basal hypertrophy of septum. Sev mid-apicalanteroseptal, ant, and apical HK. Gr1 DD, mild AI/MR, mod TR, PASP .   Colon adenomas    Coronary artery disease    a. 07/2012 STEMI complicated by cardiogenic shock. Cath/PCI: LAD 181m (DESx2), RCA 95p(DES). EF 25%; b. 03/2017 MV: EF 57%, fixed apical, periapical, mid to distal anteroseptal defect. No ischemia.   Eczema    Fibrocystic breast disease    Gastritis    GERD (gastroesophageal reflux disease)    Glaucoma    Headache    Hyperlipidemia    Hypertension    Hypothyroidism    Insomnia    Ischemic cardiomyopathy    a. 07/2012 EF 25-35% following MI-->Improved to 45-50%;  b. 03/2017 Echo: EF 40-45%.   Myocardial infarction (HCC) 08/18/2014   Non Hodgkin's lymphoma (HCC) 2005   reoccurance 2007 and 2012   Osteoarthritis    Osteoporosis    Polyneuropathy    Polyposis of colon    Restless leg syndrome    Shingles    right   Urinary, incontinence, stress female     PAST SURGICAL HISTORY :   Past Surgical History:  Procedure Laterality Date   ABDOMINAL HYSTERECTOMY  1956   APPENDECTOMY     AXILLARY LYMPH NODE BIOPSY Left 03/18/2015   Procedure: AXILLARY LYMPH NODE BIOPSY;  Surgeon: Earline Mayotte, MD;  Location: [name];  Service: General;  Laterality: Left;   CARDIAC CATHETERIZATION  08/19/2012   [name]; [facility]   COLONOSCOPY     COLONOSCOPY WITH PROPOFOL N/A 03/02/2016   Procedure: COLONOSCOPY WITH PROPOFOL;  Surgeon: Scot Jun, MD;  Location: [facility] ENDOSCOPY;  Service: Endoscopy;  Laterality: N/A;   COLONOSCOPY WITH PROPOFOL N/A 09/26/2017   Procedure: COLONOSCOPY WITH PROPOFOL;  Surgeon: Scot Jun, MD;  Location: [facility] ENDOSCOPY;  Service: Endoscopy;  Laterality: N/A;   COLONOSCOPY WITH PROPOFOL N/A  03/11/2020   Procedure: COLONOSCOPY WITH PROPOFOL;  Surgeon: Sung Amabile, DO;  Location: [name] ENDOSCOPY;  Service: General;  Laterality: N/A;   CORONARY ANGIOPLASTY WITH STENT PLACEMENT     x3 stents   CORONARY ANGIOPLASTY WITH STENT PLACEMENT     CORONARY ARTERY BYPASS GRAFT     ESOPHAGOGASTRODUODENOSCOPY (EGD) WITH PROPOFOL N/A 03/02/2016   Procedure: ESOPHAGOGASTRODUODENOSCOPY (EGD) WITH PROPOFOL;  Surgeon: Scot Jun, MD;  Location: [facility] ENDOSCOPY;  Service: Endoscopy;  Laterality: N/A;   ESOPHAGOGASTRODUODENOSCOPY (EGD) WITH PROPOFOL N/A 04/14/2019   Procedure: ESOPHAGOGASTRODUODENOSCOPY (EGD) WITH PROPOFOL;  Surgeon: Toledo, Boykin Nearing, MD;  Location: [name] ENDOSCOPY;  Service: Gastroenterology;  Laterality: N/A;   ESOPHAGOGASTRODUODENOSCOPY (EGD) WITH PROPOFOL N/A 12/07/2020   Procedure: ESOPHAGOGASTRODUODENOSCOPY (EGD) WITH PROPOFOL;  Surgeon: Regis Bill, MD;  Location: [name] ENDOSCOPY;  Service: Endoscopy;  Laterality: N/A;   LYMPH NODE BIOPSY Right 07/24/2011   Dr Lemar Livings   MOHS SURGERY     bilateral shoulders   MOHS SURGERY Right 02/27/2022   Squamous cell   PTCA     skin cancer removal     TOTAL ABDOMINAL HYSTERECTOMY W/ BILATERAL SALPINGOOPHORECTOMY      FAMILY HISTORY :   Family History  Problem Relation Age of Onset   Heart attack Father    Heart disease Brother    Leukemia Mother    Bladder Cancer Neg Hx    Prostate cancer Neg Hx    Kidney cancer Neg Hx    Breast cancer Neg Hx     SOCIAL HISTORY:   Social History   Tobacco Use   Smoking status: Former    Types: Cigarettes    Quit date: 1950    Years since quitting: 74.5   Smokeless tobacco: Never  Vaping Use   Vaping Use: Never used  Substance Use Topics   Alcohol use: No    Alcohol/week: 0.0 standard drinks of alcohol   Drug use: No    ALLERGIES:  is allergic to ace inhibitors, benadryl [diphenhydramine hcl], codeine, diphenhydramine, guaiacol, hydrocodone, cardizem [diltiazem], and  guaifenesin & derivatives.  MEDICATIONS:  Current Outpatient Medications  Medication Sig Dispense Refill   acetaminophen (TYLENOL) 650 MG CR tablet Take 650 mg by mouth every 8 (eight) hours as needed for pain.     aspirin 81 MG tablet Take 81 mg by mouth every morning.      carvedilol (COREG) 6.25 MG tablet Take 1 tablet (6.25 mg total) by mouth 2 (two) times daily with a meal. 180 tablet 1   Cholecalciferol (VITAMIN D-3) 5000 UNITS TABS Take 2,000 Int'l Units by mouth every morning.      DULoxetine (CYMBALTA) 20 MG capsule Take 20 mg by mouth daily.     fexofenadine (ALLEGRA) 180 MG tablet Take 180 mg by mouth daily as needed for rhinitis.     ibuprofen (ADVIL) 800 MG tablet Take 800 mg by mouth every 6 (six) hours as needed.     latanoprost (XALATAN) 0.005 % ophthalmic solution Place 1 drop into both eyes at bedtime.     LORazepam (ATIVAN) 0.5 MG tablet Take 0.5 mg by mouth every 8 (eight) hours as needed for anxiety.     losartan (COZAAR) 25 MG tablet Take 1 tablet (25 mg total) by mouth daily. 90 tablet 2   pantoprazole (PROTONIX) 40 MG tablet TAKE 1 TABLET BY MOUTH TWICE A DAY 180 tablet 1   rosuvastatin (CRESTOR) 10 MG tablet TAKE 1 TABLET BY MOUTH EVERY OTHER DAY 45 tablet 1   timolol (TIMOPTIC) 0.5 % ophthalmic solution Place 1 drop into both eyes 2 (two) times daily.     traMADol (ULTRAM) 50 MG tablet Take by mouth as needed.     No current facility-administered medications for this visit.   Facility-Administered Medications Ordered in Other Visits  Medication Dose Route Frequency Provider Last Rate Last Admin   riTUXimab-hyaluronidase human (RITUXAN HYCELA) 1400-23400 MG -UT/11.7ML injection SQ 1,400 mg  1,400 mg Subcutaneous Once Earna Coder, MD        PHYSICAL EXAMINATION: ECOG PERFORMANCE STATUS: 0 - Asymptomatic  BP (!) 129/50 (BP Location: Left Arm, Patient Position: Sitting, Cuff Size: Normal)   Pulse 66   Temp (!) 97.5 F (36.4 C) (Tympanic)   Wt 104 lb  4.8 oz (47.3 kg)   SpO2 100%   BMI 19.71 kg/m   Filed Weights   02/19/23 0918  Weight: 104 lb 4.8 oz (47.3 kg)    Excoriation/skin rash associated with itching on the abdomen posterior/right side.  But no evidence of active shingles.  No lymphadenopathy noted.  Physical Exam HENT:     Head: Normocephalic and atraumatic.     Mouth/Throat:     Pharynx: No oropharyngeal exudate.  Eyes:     Pupils: Pupils are equal, round, and reactive to light.  Cardiovascular:     Rate and Rhythm: Normal rate and regular rhythm.  Pulmonary:     Effort: No respiratory distress.     Breath sounds: No wheezing.  Abdominal:     General: Bowel sounds are normal. There is no distension.     Palpations: Abdomen is soft. There is no mass.     Tenderness: There is no abdominal tenderness. There is no guarding or rebound.  Musculoskeletal:        General: No tenderness. Normal range of motion.     Cervical back: Normal range of motion and neck supple.  Skin:    General: Skin is warm.  Neurological:     Mental Status: She is alert and oriented to person, place, and time.  Psychiatric:        Mood and Affect: Affect normal.      LABORATORY DATA:  I have reviewed the data as listed    Component Value Date/Time   NA 138 02/19/2023 0858   NA 141 09/07/2014 1449   K 3.5 02/19/2023 0858   K 4.0 09/07/2014 1449   CL 105 02/19/2023 0858   CL 106 09/07/2014 1449   CO2 26 02/19/2023 0858   CO2 29 09/07/2014 1449   GLUCOSE 96 02/19/2023 0858   GLUCOSE 109 (H) 09/07/2014 1449   BUN 26 (H) 02/19/2023 0858   BUN 19 (H) 09/07/2014 1449   CREATININE 1.16 (H) 02/19/2023 0858   CREATININE 1.06 (H) 02/12/2023 0802   CREATININE 1.01 09/07/2014 1449   CALCIUM 8.8 (L) 02/19/2023 0858   CALCIUM 8.8 09/07/2014 1449   PROT 6.4 (L) 02/12/2023 0802   PROT 6.3 09/02/2015 0824   PROT 6.5 09/07/2014 1449   ALBUMIN 3.9 02/12/2023 0802   ALBUMIN 4.2 09/02/2015 0824   ALBUMIN 3.8 09/07/2014 1449   AST 14 (L)  02/12/2023 0802   ALT 9 02/12/2023 0802   ALT 22 09/07/2014 1449   ALKPHOS 57 02/12/2023 0802   ALKPHOS 75 09/07/2014 1449   BILITOT 0.8 02/12/2023 0802   GFRNONAA 46 (L) 02/19/2023 0858   GFRNONAA 51 (L) 02/12/2023 0802   GFRNONAA 56 (L) 09/07/2014 1449   GFRNONAA 44 (L) 02/09/2014 1351   GFRAA 59 (L) 02/27/2020 1023   GFRAA >60 09/07/2014 1449   GFRAA 51 (L) 02/09/2014 1351    No results found for: "SPEP", "UPEP"  Lab Results  Component Value Date   WBC 6.3 02/19/2023   NEUTROABS 2.4 02/19/2023   HGB 11.9 (L) 02/19/2023   HCT 35.4 (L) 02/19/2023   MCV 96.7 02/19/2023   PLT 142 (L) 02/19/2023      Chemistry      Component Value Date/Time   NA 138 02/19/2023 0858   NA 141 09/07/2014 1449   K 3.5 02/19/2023 0858   K 4.0 09/07/2014 1449   CL 105 02/19/2023 0858   CL 106 09/07/2014 1449   CO2 26 02/19/2023 0858   CO2 29 09/07/2014 1449   BUN 26 (H) 02/19/2023 0858   BUN 19 (H) 09/07/2014 1449   CREATININE 1.16 (H) 02/19/2023 0858   CREATININE 1.06 (H) 02/12/2023 0802   CREATININE 1.01 09/07/2014 1449      Component Value Date/Time   CALCIUM 8.8 (L) 02/19/2023 0858   CALCIUM 8.8 09/07/2014 1449   ALKPHOS 57 02/12/2023 0802   ALKPHOS 75 09/07/2014 1449   AST 14 (L) 02/12/2023 0802   ALT 9  02/12/2023 0802   ALT 22 09/07/2014 1449   BILITOT 0.8 02/12/2023 0802      RADIOGRAPHIC STUDIES: I have personally reviewed the radiological images as listed and agreed with the findings in the report. No results found.   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 Status post multiple lines of therapy in the past; October 2022 finished -s/p Rituxan weely x4.  Jan 11, 2023-  Progressive bilateral axillary and subpectoral lymphadenopathy ([place] 4);  Small bilateral cervical lymph nodes with low level hypermetabolism ; Enlarging retroperitoneal and  pelvic lymph nodes ([place] 4); Right inguinal lymph nodes with low level hypermetabolism.  No findings for osseous lymphoma.  Symptomatic given worsening fatigue/poor appetite/ night sweats.   # Hepatitis B/C was negative.  Here for induction cycle 2 subcu rituximab 375 mg/m.  Labs reviewed and acceptable for treatment.  She is already scheduled for cycle 3 and cycle 4 weekly.  Then follow-up with Dr. B in 2 months.  # PET scan- May 2024- Small bilateral pulmonary nodules with mild hypermetabolism. Indeterminate finding but possibly intrapulmonary lymph nodes. Will monitor-will repeat imaging again in 3 months also.  # Iron deficiency-ferritin of 8.  Hemoglobin 11.9.  Advised the patient to start Slow Fe 45 mg once daily with vitamin C.  Repeat iron panel in 2 months when she sees Dr. Leonard Schwartz.  If not responding may consider iron infusions.   # Fatigue: ? Lymphoma vs other- cardiac [Dr.Arida; Feb 2024]-status post re-evaluation with cardiology;  March 08, 2023 2D echo pending.  Monitor closely.   # Weight loss: UNlikley from Lymphoma [given improvement of PET scan AUG 2023].s/p Nutrition; Joli- stable.    # Post herpetic neurlagia/Itch- recommend pramoxine topical- stable.   # DISPOSITION:  As as per IS- in 2 week/ and 3 weeks- SQ Rituxan; no labs.  # Follow up in 2 months- MD labs- cbc/cmp/LDH;   Dr.B    Orders Placed This Encounter  Procedures   CBC with Differential/Platelet    Standing Status:   Future    Standing Expiration Date:   02/19/2024   Comprehensive metabolic panel    Standing Status:   Future    Standing Expiration Date:   02/19/2024   Lactate dehydrogenase    Standing Status:   Future    Standing Expiration Date:   02/19/2024   CBC with Differential (Cancer Center Only)    Standing Status:   Future    Standing Expiration Date:   02/19/2024   CMP (Cancer Center only)    Standing Status:   Future    Standing Expiration Date:   02/19/2024   Lactate dehydrogenase    Standing  Status:   Future    Standing Expiration Date:   02/19/2024   Iron and TIBC    Standing Status:   Future    Standing Expiration Date:   02/19/2024   Ferritin    Standing Status:   Future    Standing Expiration Date:   02/19/2024     All questions were answered. The patient knows to call the clinic with any problems, questions or concerns.      Michaelyn Barter, MD 02/19/2023 10:02 AM

## 2023-02-19 NOTE — Patient Instructions (Signed)
Slow Fe 45 mg once a day. Available over the counter.

## 2023-02-19 NOTE — Progress Notes (Signed)
Patient stated that Dr. Leonard Schwartz told her she could pick up some constipation medication if she kept having some constipation and she should probably pick up some over the counter Iron pills. She would like to discuss the iron medication.

## 2023-02-19 NOTE — Patient Instructions (Signed)
[facility] [place] [facility]  Discharge Instructions: Thank you for choosing [facility] to provide your oncology and hematology care.  If you have a lab appointment with the Cancer Center, please go directly to the Cancer Center and check in at the registration area.  Wear comfortable clothing and clothing appropriate for easy access to any Portacath or PICC line.   We strive to give you quality time with your provider. You may need to reschedule your appointment if you arrive late (15 or more minutes).  Arriving late affects you and other patients whose appointments are after yours.  Also, if you miss three or more appointments without notifying the office, you may be dismissed from the clinic at the provider's discretion.      For prescription refill requests, have your pharmacy contact our office and allow 72 hours for refills to be completed.    Today you received the following chemotherapy and/or immunotherapy agents rituximab    To help prevent nausea and vomiting after your treatment, we encourage you to take your nausea medication as directed.  BELOW ARE SYMPTOMS THAT SHOULD BE REPORTED IMMEDIATELY: *FEVER GREATER THAN 100.4 F (38 C) OR HIGHER *CHILLS OR SWEATING *NAUSEA AND VOMITING THAT IS NOT CONTROLLED WITH YOUR NAUSEA MEDICATION *UNUSUAL SHORTNESS OF BREATH *UNUSUAL BRUISING OR BLEEDING *URINARY PROBLEMS (pain or burning when urinating, or frequent urination) *BOWEL PROBLEMS (unusual diarrhea, constipation, pain near the anus) TENDERNESS IN MOUTH AND THROAT WITH OR WITHOUT PRESENCE OF ULCERS (sore throat, sores in mouth, or a toothache) UNUSUAL RASH, SWELLING OR PAIN  UNUSUAL VAGINAL DISCHARGE OR ITCHING   Items with * indicate a potential emergency and should be followed up as soon as possible or go to the Emergency Department if any problems should occur.  Please show the CHEMOTHERAPY ALERT CARD or IMMUNOTHERAPY ALERT CARD at check-in to  the Emergency Department and triage nurse.  Should you have questions after your visit or need to cancel or reschedule your appointment, please contact [facility] [place] [facility]  [phone] and follow the prompts.  Office hours are 8:00 a.m. to 4:30 p.m. Monday - Friday. Please note that voicemails left after 4:00 p.m. may not be returned until the following business day.  We are closed weekends and major holidays. You have access to a nurse at all times for urgent questions. Please call the main number to the clinic [phone] and follow the prompts.  For any non-urgent questions, you may also contact your provider using MyChart. We now offer e-Visits for anyone 18 and older to request care online for non-urgent symptoms. For details visit [email or web address].   Also download the MyChart app! Go to the app store, search "MyChart", open the app, select [place], and log in with your MyChart username and password.    

## 2023-02-26 ENCOUNTER — Inpatient Hospital Stay: Payer: Medicare Other

## 2023-02-26 VITALS — BP 156/68 | HR 64 | Temp 98.2°F | Resp 19 | Ht 61.0 in | Wt 103.4 lb

## 2023-02-26 DIAGNOSIS — Z5112 Encounter for antineoplastic immunotherapy: Secondary | ICD-10-CM | POA: Diagnosis not present

## 2023-02-26 DIAGNOSIS — C8308 Small cell B-cell lymphoma, lymph nodes of multiple sites: Secondary | ICD-10-CM

## 2023-02-26 MED ORDER — DIPHENHYDRAMINE HCL 25 MG PO CAPS
50.0000 mg | ORAL_CAPSULE | Freq: Once | ORAL | Status: AC
  Administered 2023-02-26: 50 mg via ORAL

## 2023-02-26 MED ORDER — ACETAMINOPHEN 325 MG PO TABS
650.0000 mg | ORAL_TABLET | Freq: Once | ORAL | Status: AC
  Administered 2023-02-26: 650 mg via ORAL
  Filled 2023-02-26: qty 2

## 2023-02-26 MED ORDER — RITUXIMAB-HYALURONIDASE HUMAN 1400-23400 MG -UT/11.7ML ~~LOC~~ SOLN
1400.0000 mg | Freq: Once | SUBCUTANEOUS | Status: AC
  Administered 2023-02-26: 1400 mg via SUBCUTANEOUS
  Filled 2023-02-26: qty 11.7

## 2023-02-26 NOTE — Patient Instructions (Signed)
[place] [facility]  Discharge Instructions: Thank you for choosing [facility] to provide your oncology and hematology care.  If you have a lab appointment with the Cancer Center, please go directly to the Cancer Center and check in at the registration area.  Wear comfortable clothing and clothing appropriate for easy access to any Portacath or PICC line.   We strive to give you quality time with your provider. You may need to reschedule your appointment if you arrive late (15 or more minutes).  Arriving late affects you and other patients whose appointments are after yours.  Also, if you miss three or more appointments without notifying the office, you may be dismissed from the clinic at the provider's discretion.      For prescription refill requests, have your pharmacy contact our office and allow 72 hours for refills to be completed.    Today you received the following chemotherapy and/or immunotherapy agents Rituxan SQ   To help prevent nausea and vomiting after your treatment, we encourage you to take your nausea medication as directed.  BELOW ARE SYMPTOMS THAT SHOULD BE REPORTED IMMEDIATELY: *FEVER GREATER THAN 100.4 F (38 C) OR HIGHER *CHILLS OR SWEATING *NAUSEA AND VOMITING THAT IS NOT CONTROLLED WITH YOUR NAUSEA MEDICATION *UNUSUAL SHORTNESS OF BREATH *UNUSUAL BRUISING OR BLEEDING *URINARY PROBLEMS (pain or burning when urinating, or frequent urination) *BOWEL PROBLEMS (unusual diarrhea, constipation, pain near the anus) TENDERNESS IN MOUTH AND THROAT WITH OR WITHOUT PRESENCE OF ULCERS (sore throat, sores in mouth, or a toothache) UNUSUAL RASH, SWELLING OR PAIN  UNUSUAL VAGINAL DISCHARGE OR ITCHING   Items with * indicate a potential emergency and should be followed up as soon as possible or go to the Emergency Department if any problems should occur.  Please show the CHEMOTHERAPY ALERT CARD or IMMUNOTHERAPY ALERT CARD at check-in to  the Emergency Department and triage nurse.  Should you have questions after your visit or need to cancel or reschedule your appointment, please contact [facility]  [phone] and follow the prompts.  Office hours are 8:00 a.m. to 4:30 p.m. Monday - Friday. Please note that voicemails left after 4:00 p.m. may not be returned until the following business day.  We are closed weekends and major holidays. You have access to a nurse at all times for urgent questions. Please call the main number to the clinic [phone] and follow the prompts.  For any non-urgent questions, you may also contact your provider using MyChart. We now offer e-Visits for anyone 46 and older to request care online for non-urgent symptoms. For details visit [email or web address].   Also download the MyChart app! Go to the app store, search "MyChart", open the app, select [place], and log in with your MyChart username and password.

## 2023-03-05 ENCOUNTER — Inpatient Hospital Stay: Payer: Medicare Other

## 2023-03-05 VITALS — BP 137/60 | HR 61 | Temp 97.9°F | Resp 16 | Wt 103.5 lb

## 2023-03-05 DIAGNOSIS — C8308 Small cell B-cell lymphoma, lymph nodes of multiple sites: Secondary | ICD-10-CM

## 2023-03-05 DIAGNOSIS — Z5112 Encounter for antineoplastic immunotherapy: Secondary | ICD-10-CM | POA: Diagnosis not present

## 2023-03-05 MED ORDER — ACETAMINOPHEN 325 MG PO TABS
650.0000 mg | ORAL_TABLET | Freq: Once | ORAL | Status: AC
  Administered 2023-03-05: 650 mg via ORAL
  Filled 2023-03-05: qty 2

## 2023-03-05 MED ORDER — RITUXIMAB-HYALURONIDASE HUMAN 1400-23400 MG -UT/11.7ML ~~LOC~~ SOLN
1400.0000 mg | Freq: Once | SUBCUTANEOUS | Status: AC
  Administered 2023-03-05: 1400 mg via SUBCUTANEOUS
  Filled 2023-03-05: qty 11.7

## 2023-03-05 MED ORDER — DIPHENHYDRAMINE HCL 25 MG PO CAPS
50.0000 mg | ORAL_CAPSULE | Freq: Once | ORAL | Status: AC
  Administered 2023-03-05: 50 mg via ORAL
  Filled 2023-03-05: qty 2

## 2023-03-05 NOTE — Patient Instructions (Addendum)
[place] [facility] AT [facility] REGIONAL  Discharge Instructions: Thank you for choosing [facility] to provide your oncology and hematology care.  If you have a lab appointment with the Cancer Center, please go directly to the Cancer Center and check in at the registration area.  Wear comfortable clothing and clothing appropriate for easy access to any Portacath or PICC line.   We strive to give you quality time with your provider. You may need to reschedule your appointment if you arrive late (15 or more minutes).  Arriving late affects you and other patients whose appointments are after yours.  Also, if you miss three or more appointments without notifying the office, you may be dismissed from the clinic at the provider's discretion.      For prescription refill requests, have your pharmacy contact our office and allow 72 hours for refills to be completed.    Today you received the following chemotherapy and/or immunotherapy agents Rituxan Hycela      To help prevent nausea and vomiting after your treatment, we encourage you to take your nausea medication as directed.  BELOW ARE SYMPTOMS THAT SHOULD BE REPORTED IMMEDIATELY: *FEVER GREATER THAN 100.4 F (38 C) OR HIGHER *CHILLS OR SWEATING *NAUSEA AND VOMITING THAT IS NOT CONTROLLED WITH YOUR NAUSEA MEDICATION *UNUSUAL SHORTNESS OF BREATH *UNUSUAL BRUISING OR BLEEDING *URINARY PROBLEMS (pain or burning when urinating, or frequent urination) *BOWEL PROBLEMS (unusual diarrhea, constipation, pain near the anus) TENDERNESS IN MOUTH AND THROAT WITH OR WITHOUT PRESENCE OF ULCERS (sore throat, sores in mouth, or a toothache) UNUSUAL RASH, SWELLING OR PAIN  UNUSUAL VAGINAL DISCHARGE OR ITCHING   Items with * indicate a potential emergency and should be followed up as soon as possible or go to the Emergency Department if any problems should occur.  Please show the CHEMOTHERAPY ALERT CARD or IMMUNOTHERAPY ALERT CARD at  check-in to the Emergency Department and triage nurse.  Should you have questions after your visit or need to cancel or reschedule your appointment, please contact [place] [facility]  [phone] and follow the prompts.  Office hours are 8:00 a.m. to 4:30 p.m. Monday - Friday. Please note that voicemails left after 4:00 p.m. may not be returned until the following business day.  We are closed weekends and major holidays. You have access to a nurse at all times for urgent questions. Please call the main number to the clinic [phone] and follow the prompts.  For any non-urgent questions, you may also contact your provider using MyChart. We now offer e-Visits for anyone 6 and older to request care online for non-urgent symptoms. For details visit [email or web address].   Also download the MyChart app! Go to the app store, search "MyChart", open the app, select [place], and log in with your MyChart username and password.

## 2023-03-14 ENCOUNTER — Encounter: Payer: Self-pay | Admitting: Internal Medicine

## 2023-03-19 ENCOUNTER — Ambulatory Visit: Payer: Medicare Other | Admitting: Internal Medicine

## 2023-03-19 ENCOUNTER — Other Ambulatory Visit: Payer: Medicare Other

## 2023-04-24 ENCOUNTER — Inpatient Hospital Stay: Payer: Medicare Other | Attending: Internal Medicine

## 2023-04-24 ENCOUNTER — Encounter: Payer: Self-pay | Admitting: Internal Medicine

## 2023-04-24 ENCOUNTER — Inpatient Hospital Stay (HOSPITAL_BASED_OUTPATIENT_CLINIC_OR_DEPARTMENT_OTHER): Payer: Medicare Other | Admitting: Internal Medicine

## 2023-04-24 VITALS — BP 148/75 | HR 73 | Temp 96.4°F | Ht 61.0 in | Wt 104.2 lb

## 2023-04-24 DIAGNOSIS — I5042 Chronic combined systolic (congestive) and diastolic (congestive) heart failure: Secondary | ICD-10-CM | POA: Diagnosis not present

## 2023-04-24 DIAGNOSIS — C8304 Small cell B-cell lymphoma, lymph nodes of axilla and upper limb: Secondary | ICD-10-CM

## 2023-04-24 DIAGNOSIS — Z808 Family history of malignant neoplasm of other organs or systems: Secondary | ICD-10-CM | POA: Diagnosis not present

## 2023-04-24 DIAGNOSIS — Z86010 Personal history of colonic polyps: Secondary | ICD-10-CM | POA: Diagnosis not present

## 2023-04-24 DIAGNOSIS — Z681 Body mass index (BMI) 19 or less, adult: Secondary | ICD-10-CM | POA: Insufficient documentation

## 2023-04-24 DIAGNOSIS — M2550 Pain in unspecified joint: Secondary | ICD-10-CM | POA: Insufficient documentation

## 2023-04-24 DIAGNOSIS — D509 Iron deficiency anemia, unspecified: Secondary | ICD-10-CM

## 2023-04-24 DIAGNOSIS — M549 Dorsalgia, unspecified: Secondary | ICD-10-CM | POA: Insufficient documentation

## 2023-04-24 DIAGNOSIS — I4891 Unspecified atrial fibrillation: Secondary | ICD-10-CM | POA: Diagnosis not present

## 2023-04-24 DIAGNOSIS — R5383 Other fatigue: Secondary | ICD-10-CM | POA: Diagnosis not present

## 2023-04-24 DIAGNOSIS — Z8572 Personal history of non-Hodgkin lymphomas: Secondary | ICD-10-CM | POA: Diagnosis not present

## 2023-04-24 DIAGNOSIS — Z888 Allergy status to other drugs, medicaments and biological substances status: Secondary | ICD-10-CM | POA: Diagnosis not present

## 2023-04-24 DIAGNOSIS — Z8719 Personal history of other diseases of the digestive system: Secondary | ICD-10-CM | POA: Insufficient documentation

## 2023-04-24 DIAGNOSIS — Z90722 Acquired absence of ovaries, bilateral: Secondary | ICD-10-CM | POA: Insufficient documentation

## 2023-04-24 DIAGNOSIS — Z90710 Acquired absence of both cervix and uterus: Secondary | ICD-10-CM | POA: Insufficient documentation

## 2023-04-24 DIAGNOSIS — R61 Generalized hyperhidrosis: Secondary | ICD-10-CM | POA: Diagnosis not present

## 2023-04-24 DIAGNOSIS — Z885 Allergy status to narcotic agent status: Secondary | ICD-10-CM | POA: Insufficient documentation

## 2023-04-24 DIAGNOSIS — I252 Old myocardial infarction: Secondary | ICD-10-CM | POA: Diagnosis not present

## 2023-04-24 DIAGNOSIS — Z79899 Other long term (current) drug therapy: Secondary | ICD-10-CM | POA: Diagnosis not present

## 2023-04-24 DIAGNOSIS — Z8249 Family history of ischemic heart disease and other diseases of the circulatory system: Secondary | ICD-10-CM | POA: Insufficient documentation

## 2023-04-24 DIAGNOSIS — I2510 Atherosclerotic heart disease of native coronary artery without angina pectoris: Secondary | ICD-10-CM | POA: Insufficient documentation

## 2023-04-24 DIAGNOSIS — Z9049 Acquired absence of other specified parts of digestive tract: Secondary | ICD-10-CM | POA: Insufficient documentation

## 2023-04-24 DIAGNOSIS — C8308 Small cell B-cell lymphoma, lymph nodes of multiple sites: Secondary | ICD-10-CM | POA: Diagnosis present

## 2023-04-24 DIAGNOSIS — Z87891 Personal history of nicotine dependence: Secondary | ICD-10-CM | POA: Insufficient documentation

## 2023-04-24 LAB — LACTATE DEHYDROGENASE: LDH: 110 U/L (ref 98–192)

## 2023-04-24 LAB — CBC WITH DIFFERENTIAL (CANCER CENTER ONLY)
Abs Immature Granulocytes: 0.02 10*3/uL (ref 0.00–0.07)
Basophils Absolute: 0.1 10*3/uL (ref 0.0–0.1)
Basophils Relative: 1 %
Eosinophils Absolute: 0.1 10*3/uL (ref 0.0–0.5)
Eosinophils Relative: 2 %
HCT: 36.8 % (ref 36.0–46.0)
Hemoglobin: 12.2 g/dL (ref 12.0–15.0)
Immature Granulocytes: 0 %
Lymphocytes Relative: 47 %
Lymphs Abs: 2.7 10*3/uL (ref 0.7–4.0)
MCH: 31.7 pg (ref 26.0–34.0)
MCHC: 33.2 g/dL (ref 30.0–36.0)
MCV: 95.6 fL (ref 80.0–100.0)
Monocytes Absolute: 0.5 10*3/uL (ref 0.1–1.0)
Monocytes Relative: 9 %
Neutro Abs: 2.3 10*3/uL (ref 1.7–7.7)
Neutrophils Relative %: 41 %
Platelet Count: 154 10*3/uL (ref 150–400)
RBC: 3.85 MIL/uL — ABNORMAL LOW (ref 3.87–5.11)
RDW: 13.6 % (ref 11.5–15.5)
WBC Count: 5.7 10*3/uL (ref 4.0–10.5)
nRBC: 0 % (ref 0.0–0.2)

## 2023-04-24 LAB — IRON AND TIBC
Iron: 108 ug/dL (ref 28–170)
Saturation Ratios: 27 % (ref 10.4–31.8)
TIBC: 402 ug/dL (ref 250–450)
UIBC: 294 ug/dL

## 2023-04-24 LAB — CMP (CANCER CENTER ONLY)
ALT: 11 U/L (ref 0–44)
AST: 15 U/L (ref 15–41)
Albumin: 4.1 g/dL (ref 3.5–5.0)
Alkaline Phosphatase: 53 U/L (ref 38–126)
Anion gap: 7 (ref 5–15)
BUN: 23 mg/dL (ref 8–23)
CO2: 26 mmol/L (ref 22–32)
Calcium: 9 mg/dL (ref 8.9–10.3)
Chloride: 103 mmol/L (ref 98–111)
Creatinine: 0.97 mg/dL (ref 0.44–1.00)
GFR, Estimated: 57 mL/min — ABNORMAL LOW (ref >60)
Glucose, Bld: 70 mg/dL (ref 70–99)
Potassium: 4 mmol/L (ref 3.5–5.1)
Sodium: 136 mmol/L (ref 135–145)
Total Bilirubin: 0.6 mg/dL (ref 0.3–1.2)
Total Protein: 6.1 g/dL — ABNORMAL LOW (ref 6.5–8.1)

## 2023-04-24 LAB — FERRITIN: Ferritin: 13 ng/mL (ref 11–307)

## 2023-04-24 NOTE — Progress Notes (Signed)
[facility] OFFICE PROGRESS NOTE  Patient Care Team: Marguarite Arbour, MD as PCP - General (Unknown Physician Specialty) Iran Ouch, MD as PCP - Cardiology (Cardiology) Byrnett, Merrily Pew, MD (General Surgery) Johney Maine, MD (Oncology) Iran Ouch, MD as Consulting Physician (Cardiology) Earna Coder, MD as Consulting Physician (Hematology and Oncology)   Cancer Staging  No matching staging information was found for the patient.     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  #May 24, 2021-rituximab weekly x4; FEB 2023- PET significant partial response.  #MAY 2024- PET progression- June 24th, 2024-  cycle #1 of rituximab weekly infusion x 4.  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  Accompanied by son.  Jeanne Pittman 88 y.o.  female pleasant patient above history of recurrent low-grade lymphoma/B cell non-Hodgkin-noted to have progression on imaging/ PET scan May 2024 is s/p  rituximab infusion.  c/o cold symptoms, Dr. Judithann Sheen put her on a zpack. Covid negative. Having some SOB.   Patient continues to have ongoing fatigue.  Also complains of ongoing night sweats every night.   Notes to have gained weight s/p evaluation with nutrition.  Overall weight is stable.  Review of Systems  Constitutional:  Positive for malaise/fatigue and weight loss. Negative for chills, diaphoresis and fever.  HENT:  Negative for nosebleeds and sore throat.   Eyes:  Negative for double vision.  Respiratory:  Negative for cough, hemoptysis, sputum production, shortness of breath and wheezing.   Cardiovascular:  Negative for chest pain, palpitations, orthopnea and leg swelling.  Gastrointestinal:  Negative for abdominal pain, blood in stool, constipation, diarrhea, heartburn, melena, nausea and vomiting.  Genitourinary:  Negative for dysuria, frequency and urgency.  Musculoskeletal:  Positive for back pain and joint pain.  Skin: Negative.  Negative for itching and rash.  Neurological:  Negative for dizziness, tingling, focal weakness, weakness and headaches.  Endo/Heme/Allergies:  Does not bruise/bleed easily.  Psychiatric/Behavioral:  Negative for depression. The patient is not nervous/anxious and does not have insomnia.     PAST MEDICAL HISTORY :  Past Medical History:  Diagnosis Date   A-fib (HCC)    07/2012: A. fib with  RVR in the setting of myocardial infarction. Converted to sinus rhythm with amiodarone. No recurrence.   Anxiety    Atrophic vaginitis    Chronic combined systolic (congestive) and diastolic  (congestive) heart failure (HCC)    a. EF was 25-35% post MI but improved to 45-50% in 04/2013; b. 03/2017 Echo: EF 40-45%, mod focal/basal hypertrophy of septum. Sev mid-apicalanteroseptal, ant, and apical HK. Gr1 DD, mild AI/MR, mod TR, PASP .   Colon adenomas    Coronary artery disease    a. 07/2012 STEMI complicated by cardiogenic shock. Cath/PCI: LAD 154m (DESx2), RCA 95p(DES). EF 25%; b. 03/2017 MV: EF 57%, fixed apical, periapical, mid to distal anteroseptal defect. No ischemia.   Eczema    Fibrocystic breast disease    Gastritis    GERD (gastroesophageal reflux disease)    Glaucoma    Headache    Hyperlipidemia    Hypertension    Hypothyroidism    Insomnia    Ischemic cardiomyopathy    a. 07/2012 EF 25-35% following MI-->Improved to 45-50%;  b. 03/2017 Echo: EF 40-45%.   Myocardial infarction (HCC) 08/18/2014   Non Hodgkin's lymphoma (HCC) 2005   reoccurance 2007 and 2012   Osteoarthritis    Osteoporosis    Polyneuropathy    Polyposis of colon    Restless leg syndrome    Shingles    right   Urinary, incontinence, stress female     PAST SURGICAL HISTORY :   Past Surgical History:  Procedure Laterality Date   ABDOMINAL HYSTERECTOMY  1956   APPENDECTOMY     AXILLARY LYMPH NODE BIOPSY Left 03/18/2015   Procedure: AXILLARY LYMPH NODE BIOPSY;  Surgeon: Earline Mayotte, MD;  Location: [name];  Service: General;  Laterality: Left;   CARDIAC CATHETERIZATION  08/19/2012   [name]; [facility]   COLONOSCOPY     COLONOSCOPY WITH PROPOFOL N/A 03/02/2016   Procedure: COLONOSCOPY WITH PROPOFOL;  Surgeon: Scot Jun, MD;  Location: [facility] ENDOSCOPY;  Service: Endoscopy;  Laterality: N/A;   COLONOSCOPY WITH PROPOFOL N/A 09/26/2017   Procedure: COLONOSCOPY WITH PROPOFOL;  Surgeon: Scot Jun, MD;  Location: [facility] ENDOSCOPY;  Service: Endoscopy;  Laterality: N/A;   COLONOSCOPY WITH PROPOFOL N/A 03/11/2020   Procedure: COLONOSCOPY WITH PROPOFOL;  Surgeon: Sung Amabile, DO;   Location: [name] ENDOSCOPY;  Service: General;  Laterality: N/A;   CORONARY ANGIOPLASTY WITH STENT PLACEMENT     x3 stents   CORONARY ANGIOPLASTY WITH STENT PLACEMENT     CORONARY ARTERY BYPASS GRAFT     ESOPHAGOGASTRODUODENOSCOPY (EGD) WITH PROPOFOL N/A 03/02/2016   Procedure: ESOPHAGOGASTRODUODENOSCOPY (EGD) WITH PROPOFOL;  Surgeon: Scot Jun, MD;  Location: [facility] ENDOSCOPY;  Service: Endoscopy;  Laterality: N/A;   ESOPHAGOGASTRODUODENOSCOPY (EGD) WITH PROPOFOL N/A 04/14/2019   Procedure: ESOPHAGOGASTRODUODENOSCOPY (EGD) WITH PROPOFOL;  Surgeon: Toledo, Boykin Nearing, MD;  Location: [name] ENDOSCOPY;  Service: Gastroenterology;  Laterality: N/A;   ESOPHAGOGASTRODUODENOSCOPY (EGD) WITH PROPOFOL N/A 12/07/2020   Procedure: ESOPHAGOGASTRODUODENOSCOPY (EGD) WITH PROPOFOL;  Surgeon: Regis Bill, MD;  Location: [name] ENDOSCOPY;  Service: Endoscopy;  Laterality: N/A;   LYMPH NODE BIOPSY Right 07/24/2011   Dr Lemar Livings   MOHS SURGERY     bilateral shoulders   MOHS SURGERY Right 02/27/2022   Squamous cell   PTCA     skin cancer removal     TOTAL ABDOMINAL HYSTERECTOMY W/ BILATERAL SALPINGOOPHORECTOMY      FAMILY HISTORY :   Family History  Problem Relation Age of Onset   Heart attack Father  Heart disease Brother    Leukemia Mother    Bladder Cancer Neg Hx    Prostate cancer Neg Hx    Kidney cancer Neg Hx    Breast cancer Neg Hx     SOCIAL HISTORY:   Social History   Tobacco Use   Smoking status: Former    Current packs/day: 0.00    Types: Cigarettes    Quit date: 1950    Years since quitting: 74.7   Smokeless tobacco: Never  Vaping Use   Vaping status: Never Used  Substance Use Topics   Alcohol use: No    Alcohol/week: 0.0 standard drinks of alcohol   Drug use: No    ALLERGIES:  is allergic to ace inhibitors, benadryl [diphenhydramine hcl], codeine, diphenhydramine, guaiacol, hydrocodone, cardizem [diltiazem], and guaifenesin & derivatives.  MEDICATIONS:  Current  Outpatient Medications  Medication Sig Dispense Refill   acetaminophen (TYLENOL) 650 MG CR tablet Take 650 mg by mouth every 8 (eight) hours as needed for pain.     aspirin 81 MG tablet Take 81 mg by mouth every morning.      carvedilol (COREG) 6.25 MG tablet Take 1 tablet (6.25 mg total) by mouth 2 (two) times daily with a meal. 180 tablet 1   Cholecalciferol (VITAMIN D-3) 5000 UNITS TABS Take 2,000 Int'l Units by mouth every morning.      DULoxetine (CYMBALTA) 20 MG capsule Take 20 mg by mouth daily.     fexofenadine (ALLEGRA) 180 MG tablet Take 180 mg by mouth daily as needed for rhinitis.     ibuprofen (ADVIL) 800 MG tablet Take 800 mg by mouth every 6 (six) hours as needed.     latanoprost (XALATAN) 0.005 % ophthalmic solution Place 1 drop into both eyes at bedtime.     LORazepam (ATIVAN) 0.5 MG tablet Take 0.5 mg by mouth every 8 (eight) hours as needed for anxiety.     losartan (COZAAR) 25 MG tablet Take 1 tablet (25 mg total) by mouth daily. 90 tablet 2   pantoprazole (PROTONIX) 40 MG tablet TAKE 1 TABLET BY MOUTH TWICE A DAY 180 tablet 1   rosuvastatin (CRESTOR) 10 MG tablet TAKE 1 TABLET BY MOUTH EVERY OTHER DAY 45 tablet 1   timolol (TIMOPTIC) 0.5 % ophthalmic solution Place 1 drop into both eyes 2 (two) times daily.     traMADol (ULTRAM) 50 MG tablet Take by mouth as needed.     No current facility-administered medications for this visit.    PHYSICAL EXAMINATION: ECOG PERFORMANCE STATUS: 0 - Asymptomatic  BP (!) 148/75 (BP Location: Right Arm, Patient Position: Sitting, Cuff Size: Normal)   Pulse 73   Temp (!) 96.4 F (35.8 C) (Tympanic)   Ht 5\' 1"  (1.549 m)   Wt 104 lb 3.2 oz (47.3 kg)   SpO2 99%   BMI 19.69 kg/m   Filed Weights   04/24/23 0959  Weight: 104 lb 3.2 oz (47.3 kg)    Excoriation/skin rash associated with itching on the abdomen posterior/right side.  But no evidence of active shingles.  No lymphadenopathy noted.  Physical Exam HENT:     Head:  Normocephalic and atraumatic.     Mouth/Throat:     Pharynx: No oropharyngeal exudate.  Eyes:     Pupils: Pupils are equal, round, and reactive to light.  Cardiovascular:     Rate and Rhythm: Normal rate and regular rhythm.  Pulmonary:     Effort: No respiratory distress.     Breath sounds:  No wheezing.  Abdominal:     General: Bowel sounds are normal. There is no distension.     Palpations: Abdomen is soft. There is no mass.     Tenderness: There is no abdominal tenderness. There is no guarding or rebound.  Musculoskeletal:        General: No tenderness. Normal range of motion.     Cervical back: Normal range of motion and neck supple.  Skin:    General: Skin is warm.  Neurological:     Mental Status: She is alert and oriented to person, place, and time.  Psychiatric:        Mood and Affect: Affect normal.      LABORATORY DATA:  I have reviewed the data as listed    Component Value Date/Time   NA 136 04/24/2023 0958   NA 141 09/07/2014 1449   K 4.0 04/24/2023 0958   K 4.0 09/07/2014 1449   CL 103 04/24/2023 0958   CL 106 09/07/2014 1449   CO2 26 04/24/2023 0958   CO2 29 09/07/2014 1449   GLUCOSE 70 04/24/2023 0958   GLUCOSE 109 (H) 09/07/2014 1449   BUN 23 04/24/2023 0958   BUN 19 (H) 09/07/2014 1449   CREATININE 0.97 04/24/2023 0958   CREATININE 1.01 09/07/2014 1449   CALCIUM 9.0 04/24/2023 0958   CALCIUM 8.8 09/07/2014 1449   PROT 6.1 (L) 04/24/2023 0958   PROT 6.3 09/02/2015 0824   PROT 6.5 09/07/2014 1449   ALBUMIN 4.1 04/24/2023 0958   ALBUMIN 4.2 09/02/2015 0824   ALBUMIN 3.8 09/07/2014 1449   AST 15 04/24/2023 0958   ALT 11 04/24/2023 0958   ALT 22 09/07/2014 1449   ALKPHOS 53 04/24/2023 0958   ALKPHOS 75 09/07/2014 1449   BILITOT 0.6 04/24/2023 0958   GFRNONAA 57 (L) 04/24/2023 0958   GFRNONAA 56 (L) 09/07/2014 1449   GFRNONAA 44 (L) 02/09/2014 1351   GFRAA 59 (L) 02/27/2020 1023   GFRAA >60 09/07/2014 1449   GFRAA 51 (L) 02/09/2014 1351     No results found for: "SPEP", "UPEP"  Lab Results  Component Value Date   WBC 5.7 04/24/2023   NEUTROABS 2.3 04/24/2023   HGB 12.2 04/24/2023   HCT 36.8 04/24/2023   MCV 95.6 04/24/2023   PLT 154 04/24/2023      Chemistry      Component Value Date/Time   NA 136 04/24/2023 0958   NA 141 09/07/2014 1449   K 4.0 04/24/2023 0958   K 4.0 09/07/2014 1449   CL 103 04/24/2023 0958   CL 106 09/07/2014 1449   CO2 26 04/24/2023 0958   CO2 29 09/07/2014 1449   BUN 23 04/24/2023 0958   BUN 19 (H) 09/07/2014 1449   CREATININE 0.97 04/24/2023 0958   CREATININE 1.01 09/07/2014 1449      Component Value Date/Time   CALCIUM 9.0 04/24/2023 0958   CALCIUM 8.8 09/07/2014 1449   ALKPHOS 53 04/24/2023 0958   ALKPHOS 75 09/07/2014 1449   AST 15 04/24/2023 0958   ALT 11 04/24/2023 0958   ALT 22 09/07/2014 1449   BILITOT 0.6 04/24/2023 0958      RADIOGRAPHIC STUDIES: I have personally reviewed the radiological images as listed and agreed with the findings in the report. No results found.   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 Status post multiple lines of therapy in the past; October  2022 finished -s/p Rituxan weely x4.  Jan 11, 2023-  Progressive bilateral axillary and subpectoral lymphadenopathy ([place] 4);  Small bilateral cervical lymph nodes with low level hypermetabolism ; Enlarging retroperitoneal and pelvic lymph nodes (Deauville 4); Right inguinal lymph nodes with low level hypermetabolism.  No findings for osseous lymphoma.  Symptomatic given worsening fatigue/poor appetite/ night sweats.  # currently s/p rituximab weekly infusion x 4 [finished JUNE 2024.] will plan to get PET scan in 1 months.   # Cough- likely URI- s/p z-pack; continue OTC anti-tussive.   # PET scan- May 2024- Small bilateral pulmonary nodules with mild hypermetabolism.  Indeterminate finding but possibly intrapulmonary lymph nodes. Await repeat imaging in 2 months also.  # Fatigue: ? Lymphoma vs other- cardiac [Dr.Arida; Feb 2024]-status post re-evaluation with cardiology;  March 08, 2023 2D echo- stable.   # Weight loss: UNlikley from Lymphoma [given improvement of PET scan AUG 2023].s/p Nutrition; Jeanne Pittman- stable.   # Post herpetic neurlagia/Itch- currently  pramoxine topical- stable.  # DISPOSITION:  # Follow up in 1 month  MD labs- cbc/cmp/LDH;quantitative immunoglobulins- PET scan prior-  Dr.B          Orders Placed This Encounter  Procedures   NM PET Image Restage (PS) Skull Base to Thigh (F-18 FDG)    Standing Status:   Future    Standing Expiration Date:   04/23/2024    Order Specific Question:   If indicated for the ordered procedure, I authorize the administration of a radiopharmaceutical per Radiology protocol    Answer:   Yes    Order Specific Question:   Preferred imaging location?    Answer:   [facility]   Immunoglobulins, QN, A/E/G/M    Standing Status:   Future    Standing Expiration Date:   04/23/2024   Lactate dehydrogenase    Standing Status:   Future    Standing Expiration Date:   04/23/2024   CBC (Cancer Center Only)    Standing Status:   Future    Standing Expiration Date:   04/23/2024   CMP (Cancer Center only)    Standing Status:   Future    Standing Expiration Date:   04/23/2024     All questions were answered. The patient knows to call the clinic with any problems, questions or concerns.      Earna Coder, MD 04/24/2023 10:44 AM

## 2023-04-24 NOTE — Assessment & Plan Note (Addendum)
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 Status post multiple lines of therapy in the past; October 2022 finished -s/p Rituxan weely x4.  Jan 11, 2023-  Progressive bilateral axillary and subpectoral lymphadenopathy (Deauville 4);  Small bilateral cervical lymph nodes with low level hypermetabolism ; Enlarging retroperitoneal and pelvic lymph nodes (Deauville 4); Right inguinal lymph nodes with low level hypermetabolism.  No findings for osseous lymphoma.  Symptomatic given worsening fatigue/poor appetite/ night sweats.  # currently s/p rituximab weekly infusion x 4 [finished JUNE 2024.] will plan to get PET scan in 1 months.   # Cough- likely URI- s/p z-pack; continue OTC anti-tussive.   # PET scan- May 2024- Small bilateral pulmonary nodules with mild hypermetabolism. Indeterminate finding but possibly intrapulmonary lymph nodes. Await repeat imaging in 2 months also.  # Fatigue: ? Lymphoma vs other- cardiac [Dr.Arida; Feb 2024]-status post re-evaluation with cardiology;  March 08, 2023 2D echo- stable.   # Weight loss: UNlikley from Lymphoma [given improvement of PET scan AUG 2023].s/p Nutrition; Joli- stable.   # Post herpetic neurlagia/Itch- currently  pramoxine topical- stable.  # DISPOSITION:  # Follow up in 1 month  MD labs- cbc/cmp/LDH;quantitative immunoglobulins- PET scan prior-  Dr.B

## 2023-04-24 NOTE — Progress Notes (Signed)
C/o cold symptoms, Dr. Judithann Sheen put her on a zpack. Covid negative. Having some SOB.

## 2023-05-19 ENCOUNTER — Ambulatory Visit
Admission: EM | Admit: 2023-05-19 | Discharge: 2023-05-19 | Disposition: A | Discharge status: Home / Self Care | Payer: Medicare Other | Attending: Emergency Medicine | Admitting: Emergency Medicine

## 2023-05-19 DIAGNOSIS — J069 Acute upper respiratory infection, unspecified: Secondary | ICD-10-CM | POA: Diagnosis not present

## 2023-05-19 MED ORDER — AZITHROMYCIN 250 MG PO TABS: 250.0000 mg | ORAL_TABLET | Freq: Every day | ORAL | 0 refills | Status: DC

## 2023-05-19 MED ORDER — PROMETHAZINE-DM 6.25-15 MG/5ML PO SYRP: 2.5000 mL | ORAL_SOLUTION | Freq: Every evening | ORAL | 0 refills | Status: DC | PRN

## 2023-05-19 MED ORDER — AMOXICILLIN-POT CLAVULANATE 875-125 MG PO TABS: 1.0000 | ORAL_TABLET | Freq: Two times a day (BID) | ORAL | 0 refills | Status: DC

## 2023-05-19 NOTE — ED Triage Notes (Signed)
Patient presents to UC for productive cough and chest congestion x 3 weeks. Has tried OTC mucinex with no improvement. Exposed to covid. Had a negative covid test.   Denies fever or SOB.

## 2023-05-19 NOTE — Discharge Instructions (Signed)
Today you have been evaluated for your persistent cough, on your exam at this time your lungs are clear and you are getting enough air without assistance  As your symptoms have been present for 3 weeks it is most likely that bacteria is causing them to prolong  Unfortunately we are unable to complete chest x-ray however antibiotics that are being used to provide coverage for pneumonia  Begin Augmentin every morning and every evening for 7 days  Begin azithromycin as directed  May continue use of over-the-counter DM cough syrup as it has been helpful  May use promethazine DM cough syrup at bedtime    You can take Tylenol as needed for fever reduction and pain relief.   For cough: honey 1/2 to 1 teaspoon (you can dilute the honey in water or another fluid). You can use a humidifier for chest congestion and cough.  If you don't have a humidifier, you can sit in the bathroom with the hot shower running.      For sore throat: try warm salt water gargles, cepacol lozenges, throat spray, warm tea or water with lemon/honey, popsicles or ice, or OTC cold relief medicine for throat discomfort.   For congestion: take a daily anti-histamine like Zyrtec, Claritin, and a oral decongestant, such as pseudoephedrine.  You can also use Flonase 1-2 sprays in each nostril daily.   It is important to stay hydrated: drink plenty of fluids (water, gatorade/powerade/pedialyte, juices, or teas) to keep your throat moisturized and help further relieve irritation/discomfort.

## 2023-05-19 NOTE — ED Provider Notes (Signed)
Renaldo Fiddler    CSN: [id number] Arrival date & time: 05/19/23  1345      History   Chief Complaint Chief Complaint  Patient presents with   Cough    HPI Jeanne Pittman is a 88 y.o. female.   Patient presents for evaluation of a persistent productive cough present for 3 weeks.  Intermittently experiencing shortness of breath with exertion.  General malaise and fatigue at baseline, relates to lymphoma.  Known exposure to COVID-19 has taken multiple home testing over the past 3 weeks, all testing negative.  Denies presence of wheezing chest pain or tightness, fever.  Has been using Mucinex DM which has been helpful.  Tolerating food and liquids.  Past Medical History:  Diagnosis Date   A-fib (HCC)    07/2012: A. fib with RVR in the setting of myocardial infarction. Converted to sinus rhythm with amiodarone. No recurrence.   Anxiety    Atrophic vaginitis    Chronic combined systolic (congestive) and diastolic (congestive) heart failure (HCC)    a. EF was 25-35% post MI but improved to 45-50% in 04/2013; b. 03/2017 Echo: EF 40-45%, mod focal/basal hypertrophy of septum. Sev mid-apicalanteroseptal, ant, and apical HK. Gr1 DD, mild AI/MR, mod TR, PASP .   Colon adenomas    Coronary artery disease    a. 07/2012 STEMI complicated by cardiogenic shock. Cath/PCI: LAD 128m (DESx2), RCA 95p(DES). EF 25%; b. 03/2017 MV: EF 57%, fixed apical, periapical, mid to distal anteroseptal defect. No ischemia.   Eczema    Fibrocystic breast disease    Gastritis    GERD (gastroesophageal reflux disease)    Glaucoma    Headache    Hyperlipidemia    Hypertension    Hypothyroidism    Insomnia    Ischemic cardiomyopathy    a. 07/2012 EF 25-35% following MI-->Improved to 45-50%;  b. 03/2017 Echo: EF 40-45%.   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   H/O neoplasm 01/04/2015   History of nonmelanoma skin cancer 01/04/2015   Personal history of other specified conditions 01/04/2015   Arterial vascular disease 12/30/2013   BP (high blood pressure) 12/30/2013   Adult hypothyroidism 12/30/2013   OP (osteoporosis) 12/30/2013   Avitaminosis D 12/30/2013   Atrial fibrillation (HCC) 12/30/2013   HLD (hyperlipidemia) 12/30/2013   Age-related osteoporosis without current pathological fracture 12/30/2013   Preoperative cardiovascular examination 04/07/2013   Hyperlipidemia 04/07/2013   A-fib (HCC)    Coronary artery disease    Chronic systolic heart failure (HCC)     Past Surgical History:  Procedure Laterality Date   ABDOMINAL HYSTERECTOMY  1956   APPENDECTOMY     AXILLARY LYMPH NODE BIOPSY Left 03/18/2015   Procedure: AXILLARY LYMPH NODE BIOPSY;  Surgeon: Earline Mayotte, MD;  Location: [name];  Service: General;  Laterality: Left;   CARDIAC CATHETERIZATION  08/19/2012   [name]; [place]   COLONOSCOPY     COLONOSCOPY WITH PROPOFOL N/A 03/02/2016   Procedure: COLONOSCOPY WITH PROPOFOL;  Surgeon: Scot Jun, MD;  Location: [facility] [place] ENDOSCOPY;  Service: Endoscopy;  Laterality: N/A;   COLONOSCOPY WITH PROPOFOL N/A 09/26/2017   Procedure: COLONOSCOPY WITH PROPOFOL;  Surgeon: Scot Jun, MD;  Location: [facility] ENDOSCOPY;  Service: Endoscopy;  Laterality: N/A;   COLONOSCOPY WITH PROPOFOL N/A 03/11/2020   Procedure: COLONOSCOPY WITH PROPOFOL;  Surgeon: Sung Amabile, DO;  Location: [name] ENDOSCOPY;   Service: General;  Laterality: N/A;   CORONARY ANGIOPLASTY WITH STENT PLACEMENT     x3 stents   CORONARY ANGIOPLASTY WITH STENT PLACEMENT     CORONARY ARTERY BYPASS GRAFT     ESOPHAGOGASTRODUODENOSCOPY (EGD) WITH PROPOFOL N/A 03/02/2016   Procedure: ESOPHAGOGASTRODUODENOSCOPY (EGD) WITH PROPOFOL;  Surgeon: Scot Jun, MD;  Location: [facility] ENDOSCOPY;  Service: Endoscopy;  Laterality: N/A;   ESOPHAGOGASTRODUODENOSCOPY (EGD) WITH PROPOFOL N/A 04/14/2019   Procedure: ESOPHAGOGASTRODUODENOSCOPY (EGD) WITH PROPOFOL;  Surgeon: Toledo, Boykin Nearing, MD;  Location: [name] ENDOSCOPY;  Service: Gastroenterology;  Laterality: N/A;   ESOPHAGOGASTRODUODENOSCOPY (EGD) WITH PROPOFOL N/A 12/07/2020   Procedure: ESOPHAGOGASTRODUODENOSCOPY (EGD) WITH PROPOFOL;  Surgeon: Regis Bill, MD;  Location: [name] ENDOSCOPY;  Service: Endoscopy;  Laterality: N/A;   LYMPH NODE BIOPSY Right 07/24/2011   Dr Lemar Livings   MOHS SURGERY     bilateral shoulders   MOHS SURGERY Right 02/27/2022   Squamous cell   PTCA     skin cancer removal     TOTAL ABDOMINAL HYSTERECTOMY W/ BILATERAL SALPINGOOPHORECTOMY      OB History   No obstetric history on file.      Home Medications    Prior to Admission medications   Medication Sig Start Date End Date Taking? Authorizing Provider  amoxicillin-clavulanate (AUGMENTIN) 875-125 MG tablet Take 1 tablet by mouth every 12 (twelve) hours. 05/19/23  Yes Chevon Laufer R, NP  azithromycin (ZITHROMAX) 250 MG tablet Take 1 tablet (250 mg total) by mouth daily. Take first 2 tablets together, then 1 every day until finished. 05/19/23  Yes Mariaisabel Bodiford, Elita Boone, NP  promethazine-dextromethorphan (PROMETHAZINE-DM) 6.25-15 MG/5ML syrup Take 2.5 mLs by mouth at bedtime as needed for cough. 05/19/23  Yes Aydan Phoenix, Elita Boone, NP  acetaminophen (TYLENOL) 650 MG CR tablet Take 650 mg by mouth every 8 (eight) hours as needed for pain.    [provider]  aspirin 81 MG tablet Take 81 mg by mouth  every morning.     [provider]  carvedilol (COREG) 6.25 MG tablet Take 1 tablet (6.25 mg total) by mouth 2 (two) times daily with a meal. 10/23/22   Iran Ouch, MD  Cholecalciferol (VITAMIN D-3) 5000 UNITS TABS Take 2,000 Int'l Units by mouth every morning.     [provider]  DULoxetine (CYMBALTA) 20 MG capsule Take 20 mg by mouth daily. 11/30/20   [provider]  fexofenadine (ALLEGRA) 180 MG tablet Take 180 mg by mouth daily as needed for rhinitis.    [provider]  ibuprofen (ADVIL) 800 MG tablet Take 800 mg by mouth every 6 (six) hours as needed. 10/04/20   [provider]  latanoprost (XALATAN) 0.005 % ophthalmic solution Place 1 drop into both eyes at bedtime. 07/15/14   [provider]  LORazepam (ATIVAN) 0.5 MG tablet Take 0.5 mg by mouth every 8 (eight) hours as needed for anxiety.    [provider]  losartan (COZAAR) 25 MG tablet  Take 1 tablet (25 mg total) by mouth daily. 10/17/22   Iran Ouch, MD  pantoprazole (PROTONIX) 40 MG tablet TAKE 1 TABLET BY MOUTH TWICE A DAY 12/26/22   Iran Ouch, MD  rosuvastatin (CRESTOR) 10 MG tablet TAKE 1 TABLET BY MOUTH EVERY OTHER DAY 07/07/22   Iran Ouch, MD  timolol (TIMOPTIC) 0.5 % ophthalmic solution Place 1 drop into both eyes 2 (two) times daily. 06/15/14   [provider]  traMADol (ULTRAM) 50 MG tablet Take by mouth as needed.    [provider]    Family History Family History  Problem Relation Age of Onset   Heart attack Father    Heart disease Brother    Leukemia Mother    Bladder Cancer Neg Hx    Prostate cancer Neg Hx    Kidney cancer Neg Hx    Breast cancer Neg Hx     Social History Social History   Tobacco Use   Smoking status: Former    Current packs/day: 0.00    Types: Cigarettes    Quit date: 1950    Years since quitting: 74.7   Smokeless tobacco: Never  Vaping Use   Vaping status: Never Used  Substance  Use Topics   Alcohol use: No    Alcohol/week: 0.0 standard drinks of alcohol   Drug use: No     Allergies   Ace inhibitors, Benadryl [diphenhydramine hcl], Codeine, Diphenhydramine, Guaiacol, Hydrocodone, Cardizem [diltiazem], and Guaifenesin & derivatives   Review of Systems Review of Systems   Physical Exam Triage Vital Signs ED Triage Vitals  Encounter Vitals Group     BP 05/19/23 1356 (!) 145/79     Systolic BP Percentile --      Diastolic BP Percentile --      Pulse Rate 05/19/23 1356 66     Resp 05/19/23 1356 16     Temp 05/19/23 1356 98.7 F (37.1 C)     Temp Source 05/19/23 1356 Temporal     SpO2 05/19/23 1356 97 %     Weight --      Height --      Head Circumference --      Peak Flow --      Pain Score 05/19/23 1416 0     Pain Loc --      Pain Education --      Exclude from Growth Chart --    No data found.  Updated Vital Signs BP (!) 145/79 (BP Location: Left Arm)   Pulse 66   Temp 98.7 F (37.1 C) (Temporal)   Resp 16   SpO2 97%   Visual Acuity Right Eye Distance:   Left Eye Distance:   Bilateral Distance:    Right Eye Near:   Left Eye Near:    Bilateral Near:     Physical Exam Constitutional:      Comments: Appears fatigued  HENT:     Right Ear: Tympanic membrane, ear canal and external ear normal.     Left Ear: Tympanic membrane, ear canal and external ear normal.     Nose: Nose normal.     Mouth/Throat:     Mouth: Mucous membranes are moist.     Pharynx: Oropharynx is clear.  Eyes:     Extraocular Movements: Extraocular movements intact.  Cardiovascular:     Rate and Rhythm: Normal rate and regular rhythm.     Pulses: Normal pulses.     Heart sounds: Normal heart sounds.  Pulmonary:     Effort: Pulmonary effort is normal.     Comments: Diminished but clear in all lobes congested cough witnessed Musculoskeletal:     Cervical back: Normal range of motion and neck supple.  Skin:    General: Skin is warm and dry.  Neurological:      Mental Status: She is alert and oriented to person, place, and time. Mental status is at baseline.      UC Treatments / Results  Labs (all labs ordered are listed, but only abnormal results are displayed) Labs Reviewed - No data to display  EKG   Radiology No results found.  Procedures Procedures (including critical care time)  Medications Ordered in UC Medications - No data to display  Initial Impression / Assessment and Plan / UC Course  I have reviewed the triage vital signs and the nursing notes.  Pertinent labs & imaging results that were available during my care of the patient were reviewed by me and considered in my medical decision making (see chart for details).  Acute URI  Vital signs are stable, O2 saturation 97% on room air, pulmonary effort normal and lungs are clear but diminished at this time, stable for outpatient management, unable to complete chest x-ray as technician unavailable, discussed with patient and family prior to initiation of visit, symptoms present for 3 weeks without resolution beginning treatment with Augmentin and azithromycin, prescribed Promethazine DM for bedtime has been using alcohol to help her sleep therefore I believe she will be able to manage, may use additional over-the-counter medicine for supportive care as needed with urgent care follow-up if symptoms continue to persistent or worsen Final Clinical Impressions(s) / UC Diagnoses   Final diagnoses:  Acute URI     Discharge Instructions      Today you have been evaluated for your persistent cough, on your exam at this time your lungs are clear and you are getting enough air without assistance  As your symptoms have been present for 3 weeks it is most likely that bacteria is causing them to prolong  Unfortunately we are unable to complete chest x-ray however antibiotics that are being used to provide coverage for pneumonia  Begin Augmentin every morning and every evening  for 7 days  Begin azithromycin as directed  May continue use of over-the-counter DM cough syrup as it has been helpful  May use promethazine DM cough syrup at bedtime    You can take Tylenol as needed for fever reduction and pain relief.   For cough: honey 1/2 to 1 teaspoon (you can dilute the honey in water or another fluid). You can use a humidifier for chest congestion and cough.  If you don't have a humidifier, you can sit in the bathroom with the hot shower running.      For sore throat: try warm salt water gargles, cepacol lozenges, throat spray, warm tea or water with lemon/honey, popsicles or ice, or OTC cold relief medicine for throat discomfort.   For congestion: take a daily anti-histamine like Zyrtec, Claritin, and a oral decongestant, such as pseudoephedrine.  You can also use Flonase 1-2 sprays in each nostril daily.   It is important to stay hydrated: drink plenty of fluids (water, gatorade/powerade/pedialyte, juices, or teas) to keep your throat moisturized and help further relieve irritation/discomfort.    ED Prescriptions     Medication Sig Dispense Auth. Provider   amoxicillin-clavulanate (AUGMENTIN) 875-125 MG tablet Take 1 tablet by mouth every 12 (twelve) hours. 14  tablet Salli Quarry R, NP   azithromycin (ZITHROMAX) 250 MG tablet Take 1 tablet (250 mg total) by mouth daily. Take first 2 tablets together, then 1 every day until finished. 6 tablet Salli Quarry R, NP   promethazine-dextromethorphan (PROMETHAZINE-DM) 6.25-15 MG/5ML syrup Take 2.5 mLs by mouth at bedtime as needed for cough. 118 mL Acasia Skilton, Elita Boone, NP      PDMP not reviewed this encounter.   Valinda Hoar, NP 05/19/23 1429

## 2023-05-23 ENCOUNTER — Encounter: Payer: Self-pay | Admitting: Anesthesiology

## 2023-05-25 ENCOUNTER — Ambulatory Visit
Admission: RE | Admit: 2023-05-25 | Discharge: 2023-05-25 | Disposition: A | Discharge status: Home / Self Care | Payer: Medicare Other | Source: Ambulatory Visit | Attending: Internal Medicine | Admitting: Internal Medicine

## 2023-05-25 DIAGNOSIS — C8308 Small cell B-cell lymphoma, lymph nodes of multiple sites: Secondary | ICD-10-CM | POA: Insufficient documentation

## 2023-05-25 DIAGNOSIS — C8304 Small cell B-cell lymphoma, lymph nodes of axilla and upper limb: Secondary | ICD-10-CM | POA: Diagnosis present

## 2023-05-25 LAB — GLUCOSE, CAPILLARY: Glucose-Capillary: 85 mg/dL (ref 70–99)

## 2023-05-25 MED ORDER — FLUDEOXYGLUCOSE F - 18 (FDG) INJECTION
5.4000 | Freq: Once | INTRAVENOUS | Status: AC | PRN
  Administered 2023-05-25: 5.57 via INTRAVENOUS

## 2023-06-01 ENCOUNTER — Ambulatory Visit: Payer: Medicare Other | Admitting: Internal Medicine

## 2023-06-01 ENCOUNTER — Other Ambulatory Visit: Payer: Medicare Other

## 2023-06-04 ENCOUNTER — Inpatient Hospital Stay (HOSPITAL_BASED_OUTPATIENT_CLINIC_OR_DEPARTMENT_OTHER): Payer: Medicare Other | Admitting: Internal Medicine

## 2023-06-04 ENCOUNTER — Encounter: Payer: Self-pay | Admitting: Internal Medicine

## 2023-06-04 ENCOUNTER — Inpatient Hospital Stay: Payer: Medicare Other | Attending: Internal Medicine

## 2023-06-04 VITALS — BP 147/65 | HR 84 | Temp 97.1°F | Ht 61.0 in | Wt 105.2 lb

## 2023-06-04 DIAGNOSIS — I2510 Atherosclerotic heart disease of native coronary artery without angina pectoris: Secondary | ICD-10-CM | POA: Diagnosis not present

## 2023-06-04 DIAGNOSIS — Z79899 Other long term (current) drug therapy: Secondary | ICD-10-CM | POA: Insufficient documentation

## 2023-06-04 DIAGNOSIS — Z9221 Personal history of antineoplastic chemotherapy: Secondary | ICD-10-CM | POA: Diagnosis not present

## 2023-06-04 DIAGNOSIS — Z87891 Personal history of nicotine dependence: Secondary | ICD-10-CM | POA: Diagnosis not present

## 2023-06-04 DIAGNOSIS — M549 Dorsalgia, unspecified: Secondary | ICD-10-CM | POA: Diagnosis not present

## 2023-06-04 DIAGNOSIS — Z90710 Acquired absence of both cervix and uterus: Secondary | ICD-10-CM | POA: Diagnosis not present

## 2023-06-04 DIAGNOSIS — Z885 Allergy status to narcotic agent status: Secondary | ICD-10-CM | POA: Insufficient documentation

## 2023-06-04 DIAGNOSIS — Z860101 Personal history of adenomatous and serrated colon polyps: Secondary | ICD-10-CM | POA: Insufficient documentation

## 2023-06-04 DIAGNOSIS — M2550 Pain in unspecified joint: Secondary | ICD-10-CM | POA: Diagnosis not present

## 2023-06-04 DIAGNOSIS — R634 Abnormal weight loss: Secondary | ICD-10-CM | POA: Diagnosis not present

## 2023-06-04 DIAGNOSIS — Z9049 Acquired absence of other specified parts of digestive tract: Secondary | ICD-10-CM | POA: Insufficient documentation

## 2023-06-04 DIAGNOSIS — Z90722 Acquired absence of ovaries, bilateral: Secondary | ICD-10-CM | POA: Diagnosis not present

## 2023-06-04 DIAGNOSIS — I5042 Chronic combined systolic (congestive) and diastolic (congestive) heart failure: Secondary | ICD-10-CM | POA: Insufficient documentation

## 2023-06-04 DIAGNOSIS — Z681 Body mass index (BMI) 19 or less, adult: Secondary | ICD-10-CM | POA: Diagnosis not present

## 2023-06-04 DIAGNOSIS — Z8249 Family history of ischemic heart disease and other diseases of the circulatory system: Secondary | ICD-10-CM | POA: Insufficient documentation

## 2023-06-04 DIAGNOSIS — Z888 Allergy status to other drugs, medicaments and biological substances status: Secondary | ICD-10-CM | POA: Diagnosis not present

## 2023-06-04 DIAGNOSIS — Z806 Family history of leukemia: Secondary | ICD-10-CM | POA: Insufficient documentation

## 2023-06-04 DIAGNOSIS — I252 Old myocardial infarction: Secondary | ICD-10-CM | POA: Insufficient documentation

## 2023-06-04 DIAGNOSIS — R61 Generalized hyperhidrosis: Secondary | ICD-10-CM | POA: Insufficient documentation

## 2023-06-04 DIAGNOSIS — R5383 Other fatigue: Secondary | ICD-10-CM | POA: Diagnosis not present

## 2023-06-04 DIAGNOSIS — C8304 Small cell B-cell lymphoma, lymph nodes of axilla and upper limb: Secondary | ICD-10-CM

## 2023-06-04 DIAGNOSIS — C8308 Small cell B-cell lymphoma, lymph nodes of multiple sites: Secondary | ICD-10-CM | POA: Insufficient documentation

## 2023-06-04 DIAGNOSIS — Z8719 Personal history of other diseases of the digestive system: Secondary | ICD-10-CM | POA: Insufficient documentation

## 2023-06-04 LAB — CBC (CANCER CENTER ONLY)
HCT: 36.2 % (ref 36.0–46.0)
Hemoglobin: 12.3 g/dL (ref 12.0–15.0)
MCH: 32.1 pg (ref 26.0–34.0)
MCHC: 34 g/dL (ref 30.0–36.0)
MCV: 94.5 fL (ref 80.0–100.0)
Platelet Count: 172 10*3/uL (ref 150–400)
RBC: 3.83 MIL/uL — ABNORMAL LOW (ref 3.87–5.11)
RDW: 13.3 % (ref 11.5–15.5)
WBC Count: 6.2 10*3/uL (ref 4.0–10.5)
nRBC: 0 % (ref 0.0–0.2)

## 2023-06-04 LAB — CMP (CANCER CENTER ONLY)
ALT: 12 U/L (ref 0–44)
AST: 16 U/L (ref 15–41)
Albumin: 4.1 g/dL (ref 3.5–5.0)
Alkaline Phosphatase: 61 U/L (ref 38–126)
Anion gap: 7 (ref 5–15)
BUN: 20 mg/dL (ref 8–23)
CO2: 26 mmol/L (ref 22–32)
Calcium: 9.1 mg/dL (ref 8.9–10.3)
Chloride: 105 mmol/L (ref 98–111)
Creatinine: 1.05 mg/dL — ABNORMAL HIGH (ref 0.44–1.00)
GFR, Estimated: 52 mL/min — ABNORMAL LOW (ref >60)
Glucose, Bld: 152 mg/dL — ABNORMAL HIGH (ref 70–99)
Potassium: 4 mmol/L (ref 3.5–5.1)
Sodium: 138 mmol/L (ref 135–145)
Total Bilirubin: 1 mg/dL (ref 0.3–1.2)
Total Protein: 7 g/dL (ref 6.5–8.1)

## 2023-06-04 LAB — LACTATE DEHYDROGENASE: LDH: 114 U/L (ref 98–192)

## 2023-06-04 NOTE — Progress Notes (Signed)
[facility] OFFICE PROGRESS NOTE  Patient Care Team: Marguarite Arbour, MD as PCP - General (Unknown Physician Specialty) Iran Ouch, MD as PCP - Cardiology (Cardiology) Byrnett, Merrily Pew, MD (General Surgery) Johney Maine, MD (Oncology) Iran Ouch, MD as Consulting Physician (Cardiology) Earna Coder, MD as Consulting Physician (Hematology and Oncology)   Cancer Staging  No matching staging information was found for the patient.     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  #May 24, 2021-rituximab weekly x4; FEB 2023- PET significant partial response.  #MAY 2024- PET progression- June 24th, 2024-  cycle #1 of rituximab weekly infusion x 4.  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  Accompanied by son.  Jeanne Pittman 88 y.o.  female pleasant patient above history of recurrent low-grade lymphoma/B cell non-Hodgkin-noted to have progression on imaging/ PET scan May 2024 is s/p  rituximab infusion weekly x4 is here for a follow up/a nd review the results of PET scan.   Patient continues to have ongoing fatigue.  Also complains of ongoing night sweats every night.   Notes to have gained weight s/p evaluation with nutrition.  Overall weight is stable.  Review of Systems  Constitutional:  Positive for malaise/fatigue and weight loss. Negative for chills, diaphoresis and fever.  HENT:  Negative for nosebleeds and sore throat.   Eyes:  Negative for double vision.  Respiratory:  Negative for cough, hemoptysis, sputum production, shortness of breath and wheezing.   Cardiovascular:  Negative for chest pain, palpitations, orthopnea and leg swelling.  Gastrointestinal:  Negative for abdominal pain, blood in stool, constipation, diarrhea, heartburn, melena, nausea and vomiting.  Genitourinary:  Negative for dysuria, frequency and urgency.  Musculoskeletal:  Positive for back pain and joint pain.  Skin: Negative.  Negative for itching and rash.  Neurological:  Negative for dizziness, tingling, focal weakness, weakness and headaches.  Endo/Heme/Allergies:  Does not bruise/bleed easily.  Psychiatric/Behavioral:  Negative for depression. The patient is not nervous/anxious and does not have insomnia.     PAST MEDICAL HISTORY :  Past Medical History:  Diagnosis Date   A-fib (HCC)    07/2012: A. fib with RVR  in the setting of myocardial infarction. Converted to sinus rhythm with amiodarone. No recurrence.   Anxiety    Atrophic vaginitis    Chronic combined systolic (congestive) and diastolic (congestive)  heart failure (HCC)    a. EF was 25-35% post MI but improved to 45-50% in 04/2013; b. 03/2017 Echo: EF 40-45%, mod focal/basal hypertrophy of septum. Sev mid-apicalanteroseptal, ant, and apical HK. Gr1 DD, mild AI/MR, mod TR, PASP .   Colon adenomas    Coronary artery disease    a. 07/2012 STEMI complicated by cardiogenic shock. Cath/PCI: LAD 117m (DESx2), RCA 95p(DES). EF 25%; b. 03/2017 MV: EF 57%, fixed apical, periapical, mid to distal anteroseptal defect. No ischemia.   Eczema    Fibrocystic breast disease    Gastritis    GERD (gastroesophageal reflux disease)    Glaucoma    Headache    Hyperlipidemia    Hypertension    Hypothyroidism    Insomnia    Ischemic cardiomyopathy    a. 07/2012 EF 25-35% following MI-->Improved to 45-50%;  b. 03/2017 Echo: EF 40-45%.   Myocardial infarction (HCC) 08/18/2014   Non Hodgkin's lymphoma (HCC) 2005   reoccurance 2007 and 2012   Osteoarthritis    Osteoporosis    Polyneuropathy    Polyposis of colon    Restless leg syndrome    Shingles    right   Urinary, incontinence, stress female     PAST SURGICAL HISTORY :   Past Surgical History:  Procedure Laterality Date   ABDOMINAL HYSTERECTOMY  1956   APPENDECTOMY     AXILLARY LYMPH NODE BIOPSY Left 03/18/2015   Procedure: AXILLARY LYMPH NODE BIOPSY;  Surgeon: Earline Mayotte, MD;  Location: [name];  Service: General;  Laterality: Left;   CARDIAC CATHETERIZATION  08/19/2012   [name]; [facility]   COLONOSCOPY     COLONOSCOPY WITH PROPOFOL N/A 03/02/2016   Procedure: COLONOSCOPY WITH PROPOFOL;  Surgeon: Scot Jun, MD;  Location: [facility] ENDOSCOPY;  Service: Endoscopy;  Laterality: N/A;   COLONOSCOPY WITH PROPOFOL N/A 09/26/2017   Procedure: COLONOSCOPY WITH PROPOFOL;  Surgeon: Scot Jun, MD;  Location: [facility] ENDOSCOPY;  Service: Endoscopy;  Laterality: N/A;   COLONOSCOPY WITH PROPOFOL N/A 03/11/2020   Procedure: COLONOSCOPY WITH PROPOFOL;  Surgeon: Sung Amabile, DO;  Location: [name]  ENDOSCOPY;  Service: General;  Laterality: N/A;   CORONARY ANGIOPLASTY WITH STENT PLACEMENT     x3 stents   CORONARY ANGIOPLASTY WITH STENT PLACEMENT     CORONARY ARTERY BYPASS GRAFT     ESOPHAGOGASTRODUODENOSCOPY (EGD) WITH PROPOFOL N/A 03/02/2016   Procedure: ESOPHAGOGASTRODUODENOSCOPY (EGD) WITH PROPOFOL;  Surgeon: Scot Jun, MD;  Location: [facility] ENDOSCOPY;  Service: Endoscopy;  Laterality: N/A;   ESOPHAGOGASTRODUODENOSCOPY (EGD) WITH PROPOFOL N/A 04/14/2019   Procedure: ESOPHAGOGASTRODUODENOSCOPY (EGD) WITH PROPOFOL;  Surgeon: Toledo, Boykin Nearing, MD;  Location: [name] ENDOSCOPY;  Service: Gastroenterology;  Laterality: N/A;   ESOPHAGOGASTRODUODENOSCOPY (EGD) WITH PROPOFOL N/A 12/07/2020   Procedure: ESOPHAGOGASTRODUODENOSCOPY (EGD) WITH PROPOFOL;  Surgeon: Regis Bill, MD;  Location: [name] ENDOSCOPY;  Service: Endoscopy;  Laterality: N/A;   LYMPH NODE BIOPSY Right 07/24/2011   Dr Lemar Livings   MOHS SURGERY     bilateral shoulders   MOHS SURGERY Right 02/27/2022   Squamous cell   PTCA     skin cancer removal     TOTAL ABDOMINAL HYSTERECTOMY W/ BILATERAL SALPINGOOPHORECTOMY      FAMILY HISTORY :   Family History  Problem Relation Age of Onset   Heart attack Father  Heart disease Brother    Leukemia Mother    Bladder Cancer Neg Hx    Prostate cancer Neg Hx    Kidney cancer Neg Hx    Breast cancer Neg Hx     SOCIAL HISTORY:   Social History   Tobacco Use   Smoking status: Former    Current packs/day: 0.00    Types: Cigarettes    Quit date: 1950    Years since quitting: 74.8   Smokeless tobacco: Never  Vaping Use   Vaping status: Never Used  Substance Use Topics   Alcohol use: No    Alcohol/week: 0.0 standard drinks of alcohol   Drug use: No    ALLERGIES:  is allergic to ace inhibitors, benadryl [diphenhydramine hcl], codeine, diphenhydramine, guaiacol, hydrocodone, cardizem [diltiazem], and guaifenesin & derivatives.  MEDICATIONS:  Current Outpatient  Medications  Medication Sig Dispense Refill   acetaminophen (TYLENOL) 650 MG CR tablet Take 650 mg by mouth every 8 (eight) hours as needed for pain.     aspirin 81 MG tablet Take 81 mg by mouth every morning.      carvedilol (COREG) 6.25 MG tablet Take 1 tablet (6.25 mg total) by mouth 2 (two) times daily with a meal. 180 tablet 1   Cholecalciferol (VITAMIN D-3) 5000 UNITS TABS Take 2,000 Int'l Units by mouth every morning.      DULoxetine (CYMBALTA) 20 MG capsule Take 20 mg by mouth daily.     fexofenadine (ALLEGRA) 180 MG tablet Take 180 mg by mouth daily as needed for rhinitis.     ibuprofen (ADVIL) 800 MG tablet Take 800 mg by mouth every 6 (six) hours as needed.     latanoprost (XALATAN) 0.005 % ophthalmic solution Place 1 drop into both eyes at bedtime.     LORazepam (ATIVAN) 0.5 MG tablet Take 0.5 mg by mouth every 8 (eight) hours as needed for anxiety.     losartan (COZAAR) 25 MG tablet Take 1 tablet (25 mg total) by mouth daily. 90 tablet 2   pantoprazole (PROTONIX) 40 MG tablet TAKE 1 TABLET BY MOUTH TWICE A DAY 180 tablet 1   rosuvastatin (CRESTOR) 10 MG tablet TAKE 1 TABLET BY MOUTH EVERY OTHER DAY 45 tablet 1   timolol (TIMOPTIC) 0.5 % ophthalmic solution Place 1 drop into both eyes 2 (two) times daily.     traMADol (ULTRAM) 50 MG tablet Take by mouth as needed.     No current facility-administered medications for this visit.    PHYSICAL EXAMINATION: ECOG PERFORMANCE STATUS: 0 - Asymptomatic  BP (!) 147/65 (BP Location: Left Arm, Patient Position: Sitting, Cuff Size: Normal)   Pulse 84   Temp (!) 97.1 F (36.2 C) (Tympanic)   Ht 5\' 1"  (1.549 m)   Wt 105 lb 3.2 oz (47.7 kg)   SpO2 97%   BMI 19.88 kg/m   Filed Weights   06/04/23 1439  Weight: 105 lb 3.2 oz (47.7 kg)    Excoriation/skin rash associated with itching on the abdomen posterior/right side.  But no evidence of active shingles.  No lymphadenopathy noted.  Physical Exam HENT:     Head: Normocephalic and  atraumatic.     Mouth/Throat:     Pharynx: No oropharyngeal exudate.  Eyes:     Pupils: Pupils are equal, round, and reactive to light.  Cardiovascular:     Rate and Rhythm: Normal rate and regular rhythm.  Pulmonary:     Effort: No respiratory distress.     Breath sounds:  No wheezing.  Abdominal:     General: Bowel sounds are normal. There is no distension.     Palpations: Abdomen is soft. There is no mass.     Tenderness: There is no abdominal tenderness. There is no guarding or rebound.  Musculoskeletal:        General: No tenderness. Normal range of motion.     Cervical back: Normal range of motion and neck supple.  Skin:    General: Skin is warm.  Neurological:     Mental Status: She is alert and oriented to person, place, and time.  Psychiatric:        Mood and Affect: Affect normal.      LABORATORY DATA:  I have reviewed the data as listed    Component Value Date/Time   NA 138 06/04/2023 1430   NA 141 09/07/2014 1449   K 4.0 06/04/2023 1430   K 4.0 09/07/2014 1449   CL 105 06/04/2023 1430   CL 106 09/07/2014 1449   CO2 26 06/04/2023 1430   CO2 29 09/07/2014 1449   GLUCOSE 152 (H) 06/04/2023 1430   GLUCOSE 109 (H) 09/07/2014 1449   BUN 20 06/04/2023 1430   BUN 19 (H) 09/07/2014 1449   CREATININE 1.05 (H) 06/04/2023 1430   CREATININE 1.01 09/07/2014 1449   CALCIUM 9.1 06/04/2023 1430   CALCIUM 8.8 09/07/2014 1449   PROT 7.0 06/04/2023 1430   PROT 6.3 09/02/2015 0824   PROT 6.5 09/07/2014 1449   ALBUMIN 4.1 06/04/2023 1430   ALBUMIN 4.2 09/02/2015 0824   ALBUMIN 3.8 09/07/2014 1449   AST 16 06/04/2023 1430   ALT 12 06/04/2023 1430   ALT 22 09/07/2014 1449   ALKPHOS 61 06/04/2023 1430   ALKPHOS 75 09/07/2014 1449   BILITOT 1.0 06/04/2023 1430   GFRNONAA 52 (L) 06/04/2023 1430   GFRNONAA 56 (L) 09/07/2014 1449   GFRNONAA 44 (L) 02/09/2014 1351   GFRAA 59 (L) 02/27/2020 1023   GFRAA >60 09/07/2014 1449   GFRAA 51 (L) 02/09/2014 1351    No results  found for: "SPEP", "UPEP"  Lab Results  Component Value Date   WBC 6.2 06/04/2023   NEUTROABS 2.3 04/24/2023   HGB 12.3 06/04/2023   HCT 36.2 06/04/2023   MCV 94.5 06/04/2023   PLT 172 06/04/2023      Chemistry      Component Value Date/Time   NA 138 06/04/2023 1430   NA 141 09/07/2014 1449   K 4.0 06/04/2023 1430   K 4.0 09/07/2014 1449   CL 105 06/04/2023 1430   CL 106 09/07/2014 1449   CO2 26 06/04/2023 1430   CO2 29 09/07/2014 1449   BUN 20 06/04/2023 1430   BUN 19 (H) 09/07/2014 1449   CREATININE 1.05 (H) 06/04/2023 1430   CREATININE 1.01 09/07/2014 1449      Component Value Date/Time   CALCIUM 9.1 06/04/2023 1430   CALCIUM 8.8 09/07/2014 1449   ALKPHOS 61 06/04/2023 1430   ALKPHOS 75 09/07/2014 1449   AST 16 06/04/2023 1430   ALT 12 06/04/2023 1430   ALT 22 09/07/2014 1449   BILITOT 1.0 06/04/2023 1430      RADIOGRAPHIC STUDIES: I have personally reviewed the radiological images as listed and agreed with the findings in the report. No results found.   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 Status post multiple lines of therapy in the  past; October 2022 finished -s/p Rituxan weely x4.  Jan 11, 2023-  Progressive bilateral axillary and subpectoral lymphadenopathy (Deauville 4);  Small bilateral cervical lymph nodes with low level hypermetabolism ; Enlarging retroperitoneal and pelvic lymph nodes (Deauville 4); Right inguinal lymph nodes with low level hypermetabolism.  No findings for osseous lymphoma.  Symptomatic given worsening fatigue/poor appetite/ night sweats.  # currently s/p rituximab weekly infusion x 4 [finished JUNE 2024.] will plan to get PET scan in 1 months.   # Cough- likely URI- s/p z-pack; continue OTC anti-tussive.   # PET scan- May 2024- Small bilateral pulmonary nodules with mild hypermetabolism. Indeterminate  finding but possibly intrapulmonary lymph nodes. Await repeat imaging in 2 months also.  # Fatigue: ? Lymphoma vs other- cardiac [Dr.Arida; Feb 2024]-status post re-evaluation with cardiology;  March 08, 2023 2D echo- stable.   # Weight loss: UNlikley from Lymphoma [given improvement of PET scan AUG 2023].s/p Nutrition; Joli- stable.   # Post herpetic neurlagia/Itch- currently  pramoxine topical- stable.  # DISPOSITION:  # Follow up in 1 month  MD labs- cbc/cmp/LDH;quantitative immunoglobulins- PET scan prior-  Dr.B          No orders of the defined types were placed in this encounter.    All questions were answered. The patient knows to call the clinic with any problems, questions or concerns.      Earna Coder, MD 06/04/2023 3:03 PM

## 2023-06-04 NOTE — Assessment & Plan Note (Addendum)
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 Status post multiple lines of therapy in the past; October 2022 finished -s/p Rituxan weely x4.  Jan 11, 2023-  Progressive bilateral axillary and subpectoral lymphadenopathy (Deauville 4);  Small bilateral cervical lymph nodes with low level hypermetabolism ; Enlarging retroperitoneal and pelvic lymph nodes (Deauville 4); Right inguinal lymph nodes with low level hypermetabolism.  No findings for osseous lymphoma.  Symptomatic given worsening fatigue/poor appetite/ night sweats.  # currently s/p rituximab weekly infusion x 4 [finished JUNE 2024.] 10/04-PET scan- complete response. Discussed with patient's son re: continued follow up on clinical basis.   # PET scan- May 2024- Small bilateral pulmonary nodules with mild hypermetabolism. Indeterminate finding but possibly intrapulmonary lymph nodes. Awaiting PET   # Fatigue: ? Lymphoma vs other- cardiac [Dr.Arida; Feb 2024]-status post re-evaluation with cardiology;  March 08, 2023 2D echo- stable.   # Weight loss: UNlikley from Lymphoma [given improvement of PET scan AUG 2023].s/p Nutrition; Joli- stable.   # Post herpetic neurlagia/Itch- currently  pramoxine topical-stable.   # DISPOSITION:  # Follow up TBD-  Dr.B  Addendum: Immunoglobulin-VERY LOW- discussed with patient's son risk of infections. Discussed re: using of IVIG infusions; and also the potential side effects. Son in agreement- will schedule infusions in the week  of NOV 4th, 2024- MD; IVIG- No labs-.cancel appts in April 2025. GB

## 2023-06-04 NOTE — Progress Notes (Signed)
PET 05/25/23.

## 2023-06-06 ENCOUNTER — Other Ambulatory Visit: Payer: Self-pay

## 2023-06-06 DIAGNOSIS — C8308 Small cell B-cell lymphoma, lymph nodes of multiple sites: Secondary | ICD-10-CM

## 2023-06-06 NOTE — Progress Notes (Signed)
I spoke to patient's son regarding the results of the PET scan complete response.  For now we will follow clinically hold off imaging.  Recommend follow-up in 6 months MD labs CBC CMP LDH- Dr. Leonard Schwartz

## 2023-06-08 ENCOUNTER — Other Ambulatory Visit: Payer: Self-pay

## 2023-06-08 LAB — IMMUNOGLOBULINS A/E/G/M, SERUM
IgA: 680 mg/dL — ABNORMAL HIGH (ref 64–422)
IgE (Immunoglobulin E), Serum: <2 [IU]/mL — ABNORMAL LOW (ref 6–495)
IgG (Immunoglobin G), Serum: 115 mg/dL — ABNORMAL LOW (ref 586–1602)
IgM (Immunoglobulin M), Srm: 22 mg/dL — ABNORMAL LOW (ref 26–217)

## 2023-06-11 ENCOUNTER — Encounter: Payer: Self-pay | Admitting: Internal Medicine

## 2023-06-11 ENCOUNTER — Telehealth: Payer: Self-pay | Admitting: Internal Medicine

## 2023-06-11 NOTE — Telephone Encounter (Signed)
Please schedule infusions in the week  of NOV 4th, 2024- MD; IVIG- No labs-.   Cancel April 2024- appts. GB

## 2023-06-13 ENCOUNTER — Encounter: Payer: Self-pay | Admitting: *Deleted

## 2023-06-13 ENCOUNTER — Telehealth: Payer: Self-pay | Admitting: *Deleted

## 2023-06-13 NOTE — Telephone Encounter (Signed)
Son called and stated patient is scheduled for IVIG 06/25/23 after discussing with  Dr Donneta Romberg recently. Son stated patient also had an eyelid surgery scheduled the same week.  Son wanting to know if it is ok to have procedure or should it be rescheduled?

## 2023-06-13 NOTE — Telephone Encounter (Signed)
Called returned to son and instructed that patient should not have surgery the same week as IVIG infusion.  Needs to be at least 2 weeks later.  Son states he is going to discuss with patient and reschedule surgery for January 2025.

## 2023-06-21 ENCOUNTER — Other Ambulatory Visit: Payer: Self-pay

## 2023-06-24 NOTE — Assessment & Plan Note (Signed)
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 Status post multiple lines of therapy in the past; October 2022 finished -s/p Rituxan weely x4.  Jan 11, 2023-  Progressive bilateral axillary and subpectoral lymphadenopathy (Deauville 4);  Small bilateral cervical lymph nodes with low level hypermetabolism ; Enlarging retroperitoneal and pelvic lymph nodes (Deauville 4); Right inguinal lymph nodes with low level hypermetabolism.  No findings for osseous lymphoma-  s/p rituximab weekly infusion x 4 [finished JUNE 2024.] 10/04-PET scan- complete response.   # Severe immunoglobulin deficiency- [status post rituximab]-high risk of infections-recommend IVIG infusion.  Discussed re: using of IVIG infusions; and also the potential side effects- proceed with IVIG [400 mg/kg] monthly x3. Proceed with infusion today.   # PET scan- May 2024- Small bilateral pulmonary nodules with mild hypermetabolism- OCT 2024- PET- no worsening lung nodules.  # Fatigue: ? Lymphoma vs other- cardiac [Dr.Arida; Feb 2024]-status post re-evaluation with cardiology;  March 08, 2023 2D echo- stable.   # Weight loss: UNlikley from Lymphoma [given improvement of PET scan AUG 2023].s/p Nutrition; Joli- stable.   # Post herpetic neurlagia/Itch- currently  pramoxine topical-stable.   # Vaccinations: Ok with flu shot [nov 24] next covid/RSV.   # DISPOSITION:  #  flu shot today # IVIG today # in 4 weeks- MD; labs- cbc.cmp; IVIG [same day or the next day based on infusion schedule]- Dr.B

## 2023-06-24 NOTE — Progress Notes (Unsigned)
[facility] OFFICE PROGRESS NOTE  Patient Care Team: Marguarite Arbour, MD as PCP - General (Unknown Physician Specialty) Iran Ouch, MD as PCP - Cardiology (Cardiology) Byrnett, Merrily Pew, MD (General Surgery) Johney Maine, MD (Oncology) Iran Ouch, MD as Consulting Physician (Cardiology) Earna Coder, MD as Consulting Physician (Hematology and Oncology)   Cancer Staging  No matching staging information was found for the patient.     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  #May 24, 2021-rituximab weekly x4; FEB 2023- PET significant partial response.  #MAY 2024- PET progression- June 24th, 2024-  cycle #1 of rituximab weekly infusion x 4.  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  Accompanied by son.  Jeanne Pittman 88 y.o.  female pleasant patient above history of recurrent low-grade lymphoma/B cell non-Hodgkin-noted to have progression on imaging/ PET scan May 2024 is s/p  rituximab infusion weekly x4 is here for a follow up/a nd review the results of PET scan; and also to proceed with IVIG infusion for severe immunodeficiency...   Patient continues to have ongoing fatigue.  She denies any worsening night sweats every night.   Notes to have gained weight s/p evaluation with nutrition.  Overall weight is stable.  Review of Systems  Constitutional:  Positive for malaise/fatigue and weight loss. Negative for chills, diaphoresis and fever.  HENT:  Negative for nosebleeds and sore throat.   Eyes:  Negative for double vision.  Respiratory:  Negative for cough, hemoptysis, sputum production, shortness of breath and wheezing.   Cardiovascular:  Negative for chest pain, palpitations, orthopnea and leg swelling.  Gastrointestinal:  Negative for abdominal pain, blood in stool, constipation, diarrhea, heartburn, melena, nausea and vomiting.  Genitourinary:  Negative for dysuria, frequency and urgency.  Musculoskeletal:  Positive for back pain and joint pain.  Skin: Negative.  Negative for itching and rash.  Neurological:  Negative for dizziness, tingling, focal weakness, weakness and headaches.  Endo/Heme/Allergies:  Does not bruise/bleed easily.  Psychiatric/Behavioral:  Negative for depression. The patient is not nervous/anxious and does not have insomnia.     PAST MEDICAL HISTORY :  Past Medical History:  Diagnosis Date  A-fib (HCC)    07/2012: A. fib with RVR in the setting of myocardial infarction. Converted to sinus rhythm with amiodarone. No recurrence.   Anxiety    Atrophic vaginitis     Chronic combined systolic (congestive) and diastolic (congestive) heart failure (HCC)    a. EF was 25-35% post MI but improved to 45-50% in 04/2013; b. 03/2017 Echo: EF 40-45%, mod focal/basal hypertrophy of septum. Sev mid-apicalanteroseptal, ant, and apical HK. Gr1 DD, mild AI/MR, mod TR, PASP .   Colon adenomas    Coronary artery disease    a. 07/2012 STEMI complicated by cardiogenic shock. Cath/PCI: LAD 136m (DESx2), RCA 95p(DES). EF 25%; b. 03/2017 MV: EF 57%, fixed apical, periapical, mid to distal anteroseptal defect. No ischemia.   Eczema    Fibrocystic breast disease    Gastritis    GERD (gastroesophageal reflux disease)    Glaucoma    Headache    Hyperlipidemia    Hypertension    Hypothyroidism    Insomnia    Ischemic cardiomyopathy    a. 07/2012 EF 25-35% following MI-->Improved to 45-50%;  b. 03/2017 Echo: EF 40-45%.   Myocardial infarction (HCC) 08/18/2014   Non Hodgkin's lymphoma (HCC) 2005   reoccurance 2007 and 2012   Osteoarthritis    Osteoporosis    Polyneuropathy    Polyposis of colon    Restless leg syndrome    Shingles    right   Urinary, incontinence, stress female     PAST SURGICAL HISTORY :   Past Surgical History:  Procedure Laterality Date   ABDOMINAL HYSTERECTOMY  1956   APPENDECTOMY     AXILLARY LYMPH NODE BIOPSY Left 03/18/2015   Procedure: AXILLARY LYMPH NODE BIOPSY;  Surgeon: Earline Mayotte, MD;  Location: [name];  Service: General;  Laterality: Left;   CARDIAC CATHETERIZATION  08/19/2012   [name]; [facility]   COLONOSCOPY     COLONOSCOPY WITH PROPOFOL N/A 03/02/2016   Procedure: COLONOSCOPY WITH PROPOFOL;  Surgeon: Scot Jun, MD;  Location[facility] ENDOSCOPY;  Service: Endoscopy;  Laterality: N/A;   COLONOSCOPY WITH PROPOFOL N/A 09/26/2017   Procedure: COLONOSCOPY WITH PROPOFOL;  Surgeon: Scot Jun, MD;  Location: [facility] ENDOSCOPY;  Service: Endoscopy;  Laterality: N/A;   COLONOSCOPY WITH PROPOFOL N/A 03/11/2020   Procedure:  COLONOSCOPY WITH PROPOFOL;  Surgeon: Sung Amabile, DO;  Location: [name] ENDOSCOPY;  Service: General;  Laterality: N/A;   CORONARY ANGIOPLASTY WITH STENT PLACEMENT     x3 stents   CORONARY ANGIOPLASTY WITH STENT PLACEMENT     CORONARY ARTERY BYPASS GRAFT     ESOPHAGOGASTRODUODENOSCOPY (EGD) WITH PROPOFOL N/A 03/02/2016   Procedure: ESOPHAGOGASTRODUODENOSCOPY (EGD) WITH PROPOFOL;  Surgeon: Scot Jun, MD;  Location: [facility] ENDOSCOPY;  Service: Endoscopy;  Laterality: N/A;   ESOPHAGOGASTRODUODENOSCOPY (EGD) WITH PROPOFOL N/A 04/14/2019   Procedure: ESOPHAGOGASTRODUODENOSCOPY (EGD) WITH PROPOFOL;  Surgeon: Toledo, Boykin Nearing, MD;  Location: [name] ENDOSCOPY;  Service: Gastroenterology;  Laterality: N/A;   ESOPHAGOGASTRODUODENOSCOPY (EGD) WITH PROPOFOL N/A 12/07/2020   Procedure: ESOPHAGOGASTRODUODENOSCOPY (EGD) WITH PROPOFOL;  Surgeon: Regis Bill, MD;  Location: [name] ENDOSCOPY;  Service: Endoscopy;  Laterality: N/A;   LYMPH NODE BIOPSY Right 07/24/2011   Dr Lemar Livings   MOHS SURGERY     bilateral shoulders   MOHS SURGERY Right 02/27/2022   Squamous cell   PTCA     skin cancer removal     TOTAL ABDOMINAL HYSTERECTOMY W/ BILATERAL SALPINGOOPHORECTOMY      FAMILY HISTORY :   Family History  Problem Relation  Age of Onset   Heart attack Father    Heart disease Brother    Leukemia Mother    Bladder Cancer Neg Hx    Prostate cancer Neg Hx    Kidney cancer Neg Hx    Breast cancer Neg Hx     SOCIAL HISTORY:   Social History   Tobacco Use   Smoking status: Former    Current packs/day: 0.00    Types: Cigarettes    Quit date: 1950    Years since quitting: 74.8   Smokeless tobacco: Never  Vaping Use   Vaping status: Never Used  Substance Use Topics   Alcohol use: No    Alcohol/week: 0.0 standard drinks of alcohol   Drug use: No    ALLERGIES:  is allergic to ace inhibitors, benadryl [diphenhydramine hcl], codeine, diphenhydramine, guaiacol, hydrocodone, cardizem [diltiazem],  and guaifenesin & derivatives.  MEDICATIONS:  Current Outpatient Medications  Medication Sig Dispense Refill   acetaminophen (TYLENOL) 650 MG CR tablet Take 650 mg by mouth every 8 (eight) hours as needed for pain.     aspirin 81 MG tablet Take 81 mg by mouth every morning.      carvedilol (COREG) 6.25 MG tablet Take 1 tablet (6.25 mg total) by mouth 2 (two) times daily with a meal. 180 tablet 1   Cholecalciferol (VITAMIN D-3) 5000 UNITS TABS Take 2,000 Int'l Units by mouth every morning.      DULoxetine (CYMBALTA) 20 MG capsule Take 20 mg by mouth daily.     fexofenadine (ALLEGRA) 180 MG tablet Take 180 mg by mouth daily as needed for rhinitis.     ibuprofen (ADVIL) 800 MG tablet Take 800 mg by mouth every 6 (six) hours as needed.     latanoprost (XALATAN) 0.005 % ophthalmic solution Place 1 drop into both eyes at bedtime.     LORazepam (ATIVAN) 0.5 MG tablet Take 0.5 mg by mouth every 8 (eight) hours as needed for anxiety.     losartan (COZAAR) 25 MG tablet Take 1 tablet (25 mg total) by mouth daily. 90 tablet 2   pantoprazole (PROTONIX) 40 MG tablet TAKE 1 TABLET BY MOUTH TWICE A DAY 180 tablet 1   rosuvastatin (CRESTOR) 10 MG tablet TAKE 1 TABLET BY MOUTH EVERY OTHER DAY 45 tablet 1   timolol (TIMOPTIC) 0.5 % ophthalmic solution Place 1 drop into both eyes 2 (two) times daily.     traMADol (ULTRAM) 50 MG tablet Take by mouth as needed.     No current facility-administered medications for this visit.   Facility-Administered Medications Ordered in Other Visits  Medication Dose Route Frequency Provider Last Rate Last Admin   dextrose 5 % solution   Intravenous Continuous Earna Coder, MD 10 mL/hr at 06/25/23 0917 New Bag at 06/25/23 0917   Immune Globulin 10% (PRIVIGEN) IV infusion 20 g  400 mg/kg Intravenous Once Earna Coder, MD       influenza vaccine adjuvanted (FLUAD) injection 0.5 mL  0.5 mL Intramuscular Once Earna Coder, MD        PHYSICAL  EXAMINATION: ECOG PERFORMANCE STATUS: 0 - Asymptomatic  BP (!) 131/48 (BP Location: Left Arm, Patient Position: Sitting, Cuff Size: Normal)   Pulse 65   Temp (!) 96.8 F (36 C) (Tympanic)   Ht 5\' 1"  (1.549 m)   Wt 104 lb (47.2 kg)   SpO2 99%   BMI 19.65 kg/m   Filed Weights   06/25/23 0821  Weight: 104 lb (47.2 kg)  Excoriation/skin rash associated with itching on the abdomen posterior/right side.  But no evidence of active shingles.  No lymphadenopathy noted.  Physical Exam HENT:     Head: Normocephalic and atraumatic.     Mouth/Throat:     Pharynx: No oropharyngeal exudate.  Eyes:     Pupils: Pupils are equal, round, and reactive to light.  Cardiovascular:     Rate and Rhythm: Normal rate and regular rhythm.  Pulmonary:     Effort: No respiratory distress.     Breath sounds: No wheezing.  Abdominal:     General: Bowel sounds are normal. There is no distension.     Palpations: Abdomen is soft. There is no mass.     Tenderness: There is no abdominal tenderness. There is no guarding or rebound.  Musculoskeletal:        General: No tenderness. Normal range of motion.     Cervical back: Normal range of motion and neck supple.  Skin:    General: Skin is warm.  Neurological:     Mental Status: She is alert and oriented to person, place, and time.  Psychiatric:        Mood and Affect: Affect normal.      LABORATORY DATA:  I have reviewed the data as listed    Component Value Date/Time   NA 138 06/04/2023 1430   NA 141 09/07/2014 1449   K 4.0 06/04/2023 1430   K 4.0 09/07/2014 1449   CL 105 06/04/2023 1430   CL 106 09/07/2014 1449   CO2 26 06/04/2023 1430   CO2 29 09/07/2014 1449   GLUCOSE 152 (H) 06/04/2023 1430   GLUCOSE 109 (H) 09/07/2014 1449   BUN 20 06/04/2023 1430   BUN 19 (H) 09/07/2014 1449   CREATININE 1.05 (H) 06/04/2023 1430   CREATININE 1.01 09/07/2014 1449   CALCIUM 9.1 06/04/2023 1430   CALCIUM 8.8 09/07/2014 1449   PROT 7.0 06/04/2023  1430   PROT 6.3 09/02/2015 0824   PROT 6.5 09/07/2014 1449   ALBUMIN 4.1 06/04/2023 1430   ALBUMIN 4.2 09/02/2015 0824   ALBUMIN 3.8 09/07/2014 1449   AST 16 06/04/2023 1430   ALT 12 06/04/2023 1430   ALT 22 09/07/2014 1449   ALKPHOS 61 06/04/2023 1430   ALKPHOS 75 09/07/2014 1449   BILITOT 1.0 06/04/2023 1430   GFRNONAA 52 (L) 06/04/2023 1430   GFRNONAA 56 (L) 09/07/2014 1449   GFRNONAA 44 (L) 02/09/2014 1351   GFRAA 59 (L) 02/27/2020 1023   GFRAA >60 09/07/2014 1449   GFRAA 51 (L) 02/09/2014 1351    No results found for: "SPEP", "UPEP"  Lab Results  Component Value Date   WBC 6.2 06/04/2023   NEUTROABS 2.3 04/24/2023   HGB 12.3 06/04/2023   HCT 36.2 06/04/2023   MCV 94.5 06/04/2023   PLT 172 06/04/2023      Chemistry      Component Value Date/Time   NA 138 06/04/2023 1430   NA 141 09/07/2014 1449   K 4.0 06/04/2023 1430   K 4.0 09/07/2014 1449   CL 105 06/04/2023 1430   CL 106 09/07/2014 1449   CO2 26 06/04/2023 1430   CO2 29 09/07/2014 1449   BUN 20 06/04/2023 1430   BUN 19 (H) 09/07/2014 1449   CREATININE 1.05 (H) 06/04/2023 1430   CREATININE 1.01 09/07/2014 1449      Component Value Date/Time   CALCIUM 9.1 06/04/2023 1430   CALCIUM 8.8 09/07/2014 1449   ALKPHOS 61 06/04/2023  1430   ALKPHOS 75 09/07/2014 1449   AST 16 06/04/2023 1430   ALT 12 06/04/2023 1430   ALT 22 09/07/2014 1449   BILITOT 1.0 06/04/2023 1430      RADIOGRAPHIC STUDIES: I have personally reviewed the radiological images as listed and agreed with the findings in the report. No results found.   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 Status post multiple lines of therapy in the past; October 2022 finished -s/p Rituxan weely x4.  Jan 11, 2023-  Progressive bilateral axillary and subpectoral lymphadenopathy ([place] 4);  Small bilateral cervical lymph  nodes with low level hypermetabolism ; Enlarging retroperitoneal and pelvic lymph nodes (Deauville 4); Right inguinal lymph nodes with low level hypermetabolism.  No findings for osseous lymphoma-  s/p rituximab weekly infusion x 4 [finished JUNE 2024.] 10/04-PET scan- complete response.   # Severe immunoglobulin deficiency- [status post rituximab]-high risk of infections-recommend IVIG infusion.  Discussed re: using of IVIG infusions; and also the potential side effects- proceed with IVIG [400 mg/kg] monthly x3. Proceed with infusion today.   # PET scan- May 2024- Small bilateral pulmonary nodules with mild hypermetabolism- OCT 2024- PET- no worsening lung nodules.  # Fatigue: ? Lymphoma vs other- cardiac [Dr.Arida; Feb 2024]-status post re-evaluation with cardiology;  March 08, 2023 2D echo- stable.   # Weight loss: UNlikley from Lymphoma [given improvement of PET scan AUG 2023].s/p Nutrition; Joli- stable.   # Post herpetic neurlagia/Itch- currently  pramoxine topical-stable.   # Vaccinations: Ok with flu shot [nov 24] next covid/RSV.   # DISPOSITION:  #  flu shot today # IVIG today # in 4 weeks- MD; labs- cbc.cmp; IVIG [same day or the next day based on infusion schedule]- Dr.B  No orders of the defined types were placed in this encounter.  All questions were answered. The patient knows to call the clinic with any problems, questions or concerns.      Earna Coder, MD 06/25/2023 9:21 AM

## 2023-06-25 ENCOUNTER — Inpatient Hospital Stay: Payer: Medicare Other | Attending: Internal Medicine | Admitting: Internal Medicine

## 2023-06-25 ENCOUNTER — Inpatient Hospital Stay: Payer: Medicare Other

## 2023-06-25 ENCOUNTER — Encounter: Payer: Self-pay | Admitting: Internal Medicine

## 2023-06-25 VITALS — BP 125/53 | HR 76 | Temp 96.9°F | Resp 18

## 2023-06-25 VITALS — BP 131/48 | HR 65 | Temp 96.8°F | Ht 61.0 in | Wt 104.0 lb

## 2023-06-25 DIAGNOSIS — Z87891 Personal history of nicotine dependence: Secondary | ICD-10-CM | POA: Diagnosis not present

## 2023-06-25 DIAGNOSIS — Z90722 Acquired absence of ovaries, bilateral: Secondary | ICD-10-CM | POA: Diagnosis not present

## 2023-06-25 DIAGNOSIS — C8308 Small cell B-cell lymphoma, lymph nodes of multiple sites: Secondary | ICD-10-CM | POA: Diagnosis present

## 2023-06-25 DIAGNOSIS — M2550 Pain in unspecified joint: Secondary | ICD-10-CM | POA: Insufficient documentation

## 2023-06-25 DIAGNOSIS — Z885 Allergy status to narcotic agent status: Secondary | ICD-10-CM | POA: Diagnosis not present

## 2023-06-25 DIAGNOSIS — I252 Old myocardial infarction: Secondary | ICD-10-CM | POA: Insufficient documentation

## 2023-06-25 DIAGNOSIS — D803 Selective deficiency of immunoglobulin G [IgG] subclasses: Secondary | ICD-10-CM | POA: Diagnosis not present

## 2023-06-25 DIAGNOSIS — C8304 Small cell B-cell lymphoma, lymph nodes of axilla and upper limb: Secondary | ICD-10-CM

## 2023-06-25 DIAGNOSIS — Z90710 Acquired absence of both cervix and uterus: Secondary | ICD-10-CM | POA: Diagnosis not present

## 2023-06-25 DIAGNOSIS — Z79899 Other long term (current) drug therapy: Secondary | ICD-10-CM | POA: Insufficient documentation

## 2023-06-25 DIAGNOSIS — I2510 Atherosclerotic heart disease of native coronary artery without angina pectoris: Secondary | ICD-10-CM | POA: Insufficient documentation

## 2023-06-25 DIAGNOSIS — Z8719 Personal history of other diseases of the digestive system: Secondary | ICD-10-CM | POA: Insufficient documentation

## 2023-06-25 DIAGNOSIS — Z806 Family history of leukemia: Secondary | ICD-10-CM | POA: Diagnosis not present

## 2023-06-25 DIAGNOSIS — Z9049 Acquired absence of other specified parts of digestive tract: Secondary | ICD-10-CM | POA: Diagnosis not present

## 2023-06-25 DIAGNOSIS — I5042 Chronic combined systolic (congestive) and diastolic (congestive) heart failure: Secondary | ICD-10-CM | POA: Insufficient documentation

## 2023-06-25 DIAGNOSIS — R5383 Other fatigue: Secondary | ICD-10-CM | POA: Diagnosis not present

## 2023-06-25 DIAGNOSIS — M549 Dorsalgia, unspecified: Secondary | ICD-10-CM | POA: Insufficient documentation

## 2023-06-25 DIAGNOSIS — R634 Abnormal weight loss: Secondary | ICD-10-CM | POA: Insufficient documentation

## 2023-06-25 DIAGNOSIS — Z23 Encounter for immunization: Secondary | ICD-10-CM | POA: Diagnosis not present

## 2023-06-25 DIAGNOSIS — Z8249 Family history of ischemic heart disease and other diseases of the circulatory system: Secondary | ICD-10-CM | POA: Diagnosis not present

## 2023-06-25 DIAGNOSIS — Z860101 Personal history of adenomatous and serrated colon polyps: Secondary | ICD-10-CM | POA: Diagnosis not present

## 2023-06-25 DIAGNOSIS — Z9221 Personal history of antineoplastic chemotherapy: Secondary | ICD-10-CM | POA: Insufficient documentation

## 2023-06-25 DIAGNOSIS — Z8572 Personal history of non-Hodgkin lymphomas: Secondary | ICD-10-CM | POA: Insufficient documentation

## 2023-06-25 DIAGNOSIS — Z888 Allergy status to other drugs, medicaments and biological substances status: Secondary | ICD-10-CM | POA: Insufficient documentation

## 2023-06-25 MED ORDER — INFLUENZA VAC A&B SURF ANT ADJ 0.5 ML IM SUSY
0.5000 mL | PREFILLED_SYRINGE | Freq: Once | INTRAMUSCULAR | Status: AC
  Administered 2023-06-25: 0.5 mL via INTRAMUSCULAR
  Filled 2023-06-25: qty 0.5

## 2023-06-25 MED ORDER — DEXTROSE 5 % IV SOLN
INTRAVENOUS | Status: DC
  Filled 2023-06-25 (×2): qty 250

## 2023-06-25 MED ORDER — ACETAMINOPHEN 325 MG PO TABS
650.0000 mg | ORAL_TABLET | Freq: Once | ORAL | Status: AC
  Administered 2023-06-25: 650 mg via ORAL
  Filled 2023-06-25: qty 2

## 2023-06-25 MED ORDER — IMMUNE GLOBULIN (HUMAN) 10 GM/100ML IV SOLN
400.0000 mg/kg | Freq: Once | INTRAVENOUS | Status: AC
  Administered 2023-06-25: 20 g via INTRAVENOUS
  Filled 2023-06-25: qty 200

## 2023-06-25 MED ORDER — DIPHENHYDRAMINE HCL 25 MG PO CAPS
25.0000 mg | ORAL_CAPSULE | Freq: Once | ORAL | Status: AC
  Administered 2023-06-25: 25 mg via ORAL
  Filled 2023-06-25: qty 1

## 2023-06-25 NOTE — Progress Notes (Signed)
 No questions/concerns today.

## 2023-06-25 NOTE — Progress Notes (Signed)
Spoke w/ pt and family member re: diphenhydramine "allergy".  Pt states it makes her "wired".  However, she tolerated diphenhydramine w/ her Rituximab infusions and no problems w/ hyperactivity.  Pt and family member agreeable to continue diphenhydramine as premed.  Ebony Hail, Pharm.D., CPP 06/25/2023@9 :10 AM

## 2023-06-28 ENCOUNTER — Ambulatory Visit: Admit: 2023-06-28 | Payer: Medicare Other | Admitting: Ophthalmology

## 2023-06-28 SURGERY — REPAIR, PTOSIS, BROW
Anesthesia: Monitor Anesthesia Care | Laterality: Bilateral

## 2023-06-29 ENCOUNTER — Encounter: Payer: Self-pay | Admitting: Internal Medicine

## 2023-07-23 ENCOUNTER — Other Ambulatory Visit: Payer: Self-pay

## 2023-07-27 ENCOUNTER — Inpatient Hospital Stay: Payer: Medicare Other | Attending: Internal Medicine

## 2023-07-27 DIAGNOSIS — R5383 Other fatigue: Secondary | ICD-10-CM | POA: Diagnosis not present

## 2023-07-27 DIAGNOSIS — I252 Old myocardial infarction: Secondary | ICD-10-CM | POA: Diagnosis not present

## 2023-07-27 DIAGNOSIS — Z8719 Personal history of other diseases of the digestive system: Secondary | ICD-10-CM | POA: Insufficient documentation

## 2023-07-27 DIAGNOSIS — Z806 Family history of leukemia: Secondary | ICD-10-CM | POA: Diagnosis not present

## 2023-07-27 DIAGNOSIS — Z79899 Other long term (current) drug therapy: Secondary | ICD-10-CM | POA: Insufficient documentation

## 2023-07-27 DIAGNOSIS — Z87891 Personal history of nicotine dependence: Secondary | ICD-10-CM | POA: Diagnosis not present

## 2023-07-27 DIAGNOSIS — M2550 Pain in unspecified joint: Secondary | ICD-10-CM | POA: Diagnosis not present

## 2023-07-27 DIAGNOSIS — C8308 Small cell B-cell lymphoma, lymph nodes of multiple sites: Secondary | ICD-10-CM | POA: Insufficient documentation

## 2023-07-27 DIAGNOSIS — Z8249 Family history of ischemic heart disease and other diseases of the circulatory system: Secondary | ICD-10-CM | POA: Diagnosis not present

## 2023-07-27 DIAGNOSIS — Z885 Allergy status to narcotic agent status: Secondary | ICD-10-CM | POA: Insufficient documentation

## 2023-07-27 DIAGNOSIS — D803 Selective deficiency of immunoglobulin G [IgG] subclasses: Secondary | ICD-10-CM | POA: Insufficient documentation

## 2023-07-27 DIAGNOSIS — I5042 Chronic combined systolic (congestive) and diastolic (congestive) heart failure: Secondary | ICD-10-CM | POA: Diagnosis not present

## 2023-07-27 DIAGNOSIS — M549 Dorsalgia, unspecified: Secondary | ICD-10-CM | POA: Insufficient documentation

## 2023-07-27 DIAGNOSIS — Z860101 Personal history of adenomatous and serrated colon polyps: Secondary | ICD-10-CM | POA: Diagnosis not present

## 2023-07-27 DIAGNOSIS — Z9221 Personal history of antineoplastic chemotherapy: Secondary | ICD-10-CM | POA: Diagnosis not present

## 2023-07-27 DIAGNOSIS — Z90722 Acquired absence of ovaries, bilateral: Secondary | ICD-10-CM | POA: Diagnosis not present

## 2023-07-27 DIAGNOSIS — Z90710 Acquired absence of both cervix and uterus: Secondary | ICD-10-CM | POA: Diagnosis not present

## 2023-07-27 DIAGNOSIS — Z888 Allergy status to other drugs, medicaments and biological substances status: Secondary | ICD-10-CM | POA: Insufficient documentation

## 2023-07-27 DIAGNOSIS — Z9049 Acquired absence of other specified parts of digestive tract: Secondary | ICD-10-CM | POA: Insufficient documentation

## 2023-07-27 LAB — CMP (CANCER CENTER ONLY)
ALT: 15 U/L (ref 0–44)
AST: 17 U/L (ref 15–41)
Albumin: 3.9 g/dL (ref 3.5–5.0)
Alkaline Phosphatase: 51 U/L (ref 38–126)
Anion gap: 9 (ref 5–15)
BUN: 26 mg/dL — ABNORMAL HIGH (ref 8–23)
CO2: 25 mmol/L (ref 22–32)
Calcium: 9.2 mg/dL (ref 8.9–10.3)
Chloride: 106 mmol/L (ref 98–111)
Creatinine: 0.84 mg/dL (ref 0.44–1.00)
GFR, Estimated: >60 mL/min (ref >60)
Glucose, Bld: 127 mg/dL — ABNORMAL HIGH (ref 70–99)
Potassium: 4 mmol/L (ref 3.5–5.1)
Sodium: 140 mmol/L (ref 135–145)
Total Bilirubin: 0.7 mg/dL (ref <1.2)
Total Protein: 6.9 g/dL (ref 6.5–8.1)

## 2023-07-27 LAB — CBC WITH DIFFERENTIAL (CANCER CENTER ONLY)
Abs Immature Granulocytes: 0.02 10*3/uL (ref 0.00–0.07)
Basophils Absolute: 0.1 10*3/uL (ref 0.0–0.1)
Basophils Relative: 1 %
Eosinophils Absolute: 0.1 10*3/uL (ref 0.0–0.5)
Eosinophils Relative: 2 %
HCT: 36.7 % (ref 36.0–46.0)
Hemoglobin: 12.3 g/dL (ref 12.0–15.0)
Immature Granulocytes: 0 %
Lymphocytes Relative: 30 %
Lymphs Abs: 2 10*3/uL (ref 0.7–4.0)
MCH: 32.7 pg (ref 26.0–34.0)
MCHC: 33.5 g/dL (ref 30.0–36.0)
MCV: 97.6 fL (ref 80.0–100.0)
Monocytes Absolute: 0.4 10*3/uL (ref 0.1–1.0)
Monocytes Relative: 6 %
Neutro Abs: 4 10*3/uL (ref 1.7–7.7)
Neutrophils Relative %: 61 %
Platelet Count: 144 10*3/uL — ABNORMAL LOW (ref 150–400)
RBC: 3.76 MIL/uL — ABNORMAL LOW (ref 3.87–5.11)
RDW: 14.2 % (ref 11.5–15.5)
WBC Count: 6.7 10*3/uL (ref 4.0–10.5)
nRBC: 0 % (ref 0.0–0.2)

## 2023-07-27 LAB — LACTATE DEHYDROGENASE: LDH: 115 U/L (ref 98–192)

## 2023-07-30 ENCOUNTER — Inpatient Hospital Stay: Payer: Medicare Other

## 2023-07-30 ENCOUNTER — Inpatient Hospital Stay (HOSPITAL_BASED_OUTPATIENT_CLINIC_OR_DEPARTMENT_OTHER): Payer: Medicare Other | Admitting: Internal Medicine

## 2023-07-30 ENCOUNTER — Encounter: Payer: Self-pay | Admitting: Internal Medicine

## 2023-07-30 VITALS — BP 123/55 | HR 78 | Temp 96.9°F | Ht 61.0 in | Wt 103.4 lb

## 2023-07-30 VITALS — BP 156/62 | HR 68 | Temp 98.2°F | Resp 16

## 2023-07-30 DIAGNOSIS — C8308 Small cell B-cell lymphoma, lymph nodes of multiple sites: Secondary | ICD-10-CM | POA: Diagnosis not present

## 2023-07-30 DIAGNOSIS — C8304 Small cell B-cell lymphoma, lymph nodes of axilla and upper limb: Secondary | ICD-10-CM | POA: Diagnosis not present

## 2023-07-30 MED ORDER — ACETAMINOPHEN 325 MG PO TABS
650.0000 mg | ORAL_TABLET | Freq: Once | ORAL | Status: AC
  Administered 2023-07-30: 650 mg via ORAL
  Filled 2023-07-30: qty 2

## 2023-07-30 MED ORDER — DIPHENHYDRAMINE HCL 25 MG PO CAPS
25.0000 mg | ORAL_CAPSULE | Freq: Once | ORAL | Status: AC
  Administered 2023-07-30: 25 mg via ORAL
  Filled 2023-07-30: qty 1

## 2023-07-30 MED ORDER — IMMUNE GLOBULIN (HUMAN) 10 GM/100ML IV SOLN
400.0000 mg/kg | Freq: Once | INTRAVENOUS | Status: AC
  Administered 2023-07-30: 20 g via INTRAVENOUS
  Filled 2023-07-30: qty 200

## 2023-07-30 MED ORDER — DEXTROSE 5 % IV SOLN
INTRAVENOUS | Status: DC
  Filled 2023-07-30: qty 250

## 2023-07-30 NOTE — Assessment & Plan Note (Addendum)
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 Status post multiple lines of therapy in the past; October 2022 finished -s/p Rituxan weely x4.  Jan 11, 2023-  Progressive bilateral axillary and subpectoral lymphadenopathy (Deauville 4);  Small bilateral cervical lymph nodes with low level hypermetabolism ; Enlarging retroperitoneal and pelvic lymph nodes (Deauville 4); Right inguinal lymph nodes with low level hypermetabolism.  No findings for osseous lymphoma-  s/p rituximab weekly infusion x 4 [finished JUNE 2024.] 10/04-PET scan- complete response.   # Severe immunoglobulin deficiency- [status post rituximab]-high risk of infections-recommend IVIG infusion.  Discussed re: using of IVIG infusions; and also the potential side effects- proceed with IVIG [400 mg/kg] monthly x3. Proceed with infusion today # 2 of planned 3 infusions. Will re-check    # PET scan- May 2024- Small bilateral pulmonary nodules with mild hypermetabolism- OCT 2024- PET- no worsening lung nodules. Stable.   # Fatigue: ? Lymphoma vs other- cardiac [Dr.Arida; Feb 2024]-status post re-evaluation with cardiology;  March 08, 2023 2D echo- stable.   # Weight loss: UNlikley from Lymphoma [given improvement of PET scan AUG 2023].s/p Nutrition; Joli- stable.   # Post herpetic neurlagia/Itch- currently  pramoxine topical-stable.   # Vaccinations: Ok with flu shot [nov 24] next covid/RSV.   # DISPOSITION:  # IVIG today # in 4 weeks- MD; labs- cbc.cmp; IVIG [same day or the next day based on infusion schedule]- Dr.B

## 2023-07-30 NOTE — Progress Notes (Signed)
[facility] OFFICE PROGRESS NOTE  Patient Care Team: Marguarite Arbour, MD as PCP - General (Unknown Physician Specialty) Iran Ouch, MD as PCP - Cardiology (Cardiology) Byrnett, Merrily Pew, MD (General Surgery) Johney Maine, MD (Oncology) Iran Ouch, MD as Consulting Physician (Cardiology) Earna Coder, MD as Consulting Physician (Hematology and Oncology)   Cancer Staging  No matching staging information was found for the patient.     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  #May 24, 2021-rituximab weekly x4; FEB 2023- PET significant partial response.  #MAY 2024- PET progression- June 24th, 2024-  cycle #1 of rituximab weekly infusion x 4.  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  Accompanied by son.  Jeanne Pittman 88 y.o.  female pleasant patient above history of recurrent low-grade lymphoma/B cell non-Hodgkin-s/p  rituximab infusion weekly currently on  proceed with IVIG infusion for severe immunodeficiency.  C/o back pain 7/10, states just being more active with Christmas stuff, no injury.   Skin cancer rt ear, using cream rx from dermatology.   Patient continues to have ongoing fatigue.  She denies any worsening night sweats every night.   Notes to have gained weight s/p evaluation with nutrition.  Overall weight is stable.  Review of Systems  Constitutional:  Positive for malaise/fatigue and weight loss. Negative for chills, diaphoresis and fever.  HENT:  Negative for nosebleeds and sore throat.   Eyes:  Negative for double vision.  Respiratory:  Negative for cough, hemoptysis, sputum production, shortness of breath and wheezing.   Cardiovascular:  Negative for chest pain, palpitations, orthopnea and leg swelling.  Gastrointestinal:  Negative for abdominal pain, blood in stool, constipation, diarrhea, heartburn, melena, nausea and vomiting.  Genitourinary:  Negative for dysuria, frequency and urgency.  Musculoskeletal:  Positive for back pain and joint pain.  Skin: Negative.  Negative for itching and rash.  Neurological:  Negative for dizziness, tingling, focal weakness, weakness and headaches.  Endo/Heme/Allergies:  Does not bruise/bleed easily.  Psychiatric/Behavioral:  Negative for depression. The patient is not nervous/anxious and does not have insomnia.     PAST MEDICAL HISTORY :  Past Medical History:  Diagnosis Date  A-fib (HCC)    07/2012: A. fib with RVR in the setting of myocardial infarction. Converted to sinus rhythm with amiodarone. No recurrence.   Anxiety     Atrophic vaginitis    Chronic combined systolic (congestive) and diastolic (congestive) heart failure (HCC)    a. EF was 25-35% post MI but improved to 45-50% in 04/2013; b. 03/2017 Echo: EF 40-45%, mod focal/basal hypertrophy of septum. Sev mid-apicalanteroseptal, ant, and apical HK. Gr1 DD, mild AI/MR, mod TR, PASP .   Colon adenomas    Coronary artery disease    a. 07/2012 STEMI complicated by cardiogenic shock. Cath/PCI: LAD 181m (DESx2), RCA 95p(DES). EF 25%; b. 03/2017 MV: EF 57%, fixed apical, periapical, mid to distal anteroseptal defect. No ischemia.   Eczema    Fibrocystic breast disease    Gastritis    GERD (gastroesophageal reflux disease)    Glaucoma    Headache    Hyperlipidemia    Hypertension    Hypothyroidism    Insomnia    Ischemic cardiomyopathy    a. 07/2012 EF 25-35% following MI-->Improved to 45-50%;  b. 03/2017 Echo: EF 40-45%.   Myocardial infarction (HCC) 08/18/2014   Non Hodgkin's lymphoma (HCC) 2005   reoccurance 2007 and 2012   Osteoarthritis    Osteoporosis    Polyneuropathy    Polyposis of colon    Restless leg syndrome    Shingles    right   Urinary, incontinence, stress female     PAST SURGICAL HISTORY :   Past Surgical History:  Procedure Laterality Date   ABDOMINAL HYSTERECTOMY  1956   APPENDECTOMY     AXILLARY LYMPH NODE BIOPSY Left 03/18/2015   Procedure: AXILLARY LYMPH NODE BIOPSY;  Surgeon: Earline Mayotte, MD;  Location: [name];  Service: General;  Laterality: Left;   CARDIAC CATHETERIZATION  08/19/2012   [name]; [facility]   COLONOSCOPY     COLONOSCOPY WITH PROPOFOL N/A 03/02/2016   Procedure: COLONOSCOPY WITH PROPOFOL;  Surgeon: Scot Jun, MD;  Location: [facility] ENDOSCOPY;  Service: Endoscopy;  Laterality: N/A;   COLONOSCOPY WITH PROPOFOL N/A 09/26/2017   Procedure: COLONOSCOPY WITH PROPOFOL;  Surgeon: Scot Jun, MD;  Location: [facility] ENDOSCOPY;  Service: Endoscopy;  Laterality: N/A;   COLONOSCOPY WITH PROPOFOL N/A  03/11/2020   Procedure: COLONOSCOPY WITH PROPOFOL;  Surgeon: Sung Amabile, DO;  Location: [name] ENDOSCOPY;  Service: General;  Laterality: N/A;   CORONARY ANGIOPLASTY WITH STENT PLACEMENT     x3 stents   CORONARY ANGIOPLASTY WITH STENT PLACEMENT     CORONARY ARTERY BYPASS GRAFT     ESOPHAGOGASTRODUODENOSCOPY (EGD) WITH PROPOFOL N/A 03/02/2016   Procedure: ESOPHAGOGASTRODUODENOSCOPY (EGD) WITH PROPOFOL;  Surgeon: Scot Jun, MD;  Location: [facility] ENDOSCOPY;  Service: Endoscopy;  Laterality: N/A;   ESOPHAGOGASTRODUODENOSCOPY (EGD) WITH PROPOFOL N/A 04/14/2019   Procedure: ESOPHAGOGASTRODUODENOSCOPY (EGD) WITH PROPOFOL;  Surgeon: Toledo, Boykin Nearing, MD;  Location: [name] ENDOSCOPY;  Service: Gastroenterology;  Laterality: N/A;   ESOPHAGOGASTRODUODENOSCOPY (EGD) WITH PROPOFOL N/A 12/07/2020   Procedure: ESOPHAGOGASTRODUODENOSCOPY (EGD) WITH PROPOFOL;  Surgeon: Regis Bill, MD;  Location: [name] ENDOSCOPY;  Service: Endoscopy;  Laterality: N/A;   LYMPH NODE BIOPSY Right 07/24/2011   Dr Lemar Livings   MOHS SURGERY     bilateral shoulders   MOHS SURGERY Right 02/27/2022   Squamous cell   PTCA     skin cancer removal     TOTAL ABDOMINAL HYSTERECTOMY W/ BILATERAL SALPINGOOPHORECTOMY      FAMILY HISTORY :   Family History  Problem Relation  Age of Onset   Heart attack Father    Heart disease Brother    Leukemia Mother    Bladder Cancer Neg Hx    Prostate cancer Neg Hx    Kidney cancer Neg Hx    Breast cancer Neg Hx     SOCIAL HISTORY:   Social History   Tobacco Use   Smoking status: Former    Current packs/day: 0.00    Types: Cigarettes    Quit date: 1950    Years since quitting: 74.9   Smokeless tobacco: Never  Vaping Use   Vaping status: Never Used  Substance Use Topics   Alcohol use: No    Alcohol/week: 0.0 standard drinks of alcohol   Drug use: No    ALLERGIES:  is allergic to ace inhibitors, benadryl [diphenhydramine hcl], codeine, diphenhydramine, guaiacol,  hydrocodone, cardizem [diltiazem], and guaifenesin & derivatives.  MEDICATIONS:  Current Outpatient Medications  Medication Sig Dispense Refill   acetaminophen (TYLENOL) 650 MG CR tablet Take 650 mg by mouth every 8 (eight) hours as needed for pain.     aspirin 81 MG tablet Take 81 mg by mouth every morning.      carvedilol (COREG) 6.25 MG tablet Take 1 tablet (6.25 mg total) by mouth 2 (two) times daily with a meal. 180 tablet 1   Cholecalciferol (VITAMIN D-3) 5000 UNITS TABS Take 2,000 Int'l Units by mouth every morning.      DULoxetine (CYMBALTA) 20 MG capsule Take 20 mg by mouth daily.     fexofenadine (ALLEGRA) 180 MG tablet Take 180 mg by mouth daily as needed for rhinitis.     ibuprofen (ADVIL) 800 MG tablet Take 800 mg by mouth every 6 (six) hours as needed.     latanoprost (XALATAN) 0.005 % ophthalmic solution Place 1 drop into both eyes at bedtime.     LORazepam (ATIVAN) 0.5 MG tablet Take 0.5 mg by mouth every 8 (eight) hours as needed for anxiety.     losartan (COZAAR) 25 MG tablet Take 1 tablet (25 mg total) by mouth daily. 90 tablet 2   pantoprazole (PROTONIX) 40 MG tablet TAKE 1 TABLET BY MOUTH TWICE A DAY 180 tablet 1   rosuvastatin (CRESTOR) 10 MG tablet TAKE 1 TABLET BY MOUTH EVERY OTHER DAY 45 tablet 1   timolol (TIMOPTIC) 0.5 % ophthalmic solution Place 1 drop into both eyes 2 (two) times daily.     traMADol (ULTRAM) 50 MG tablet Take by mouth as needed.     No current facility-administered medications for this visit.   Facility-Administered Medications Ordered in Other Visits  Medication Dose Route Frequency Provider Last Rate Last Admin   dextrose 5 % solution   Intravenous Continuous Earna Coder, MD 10 mL/hr at 07/30/23 0922 New Bag at 07/30/23 8413   Immune Globulin 10% (PRIVIGEN) IV infusion 20 g  400 mg/kg Intravenous Once Earna Coder, MD        PHYSICAL EXAMINATION: ECOG PERFORMANCE STATUS: 0 - Asymptomatic  BP (!) 123/55 (BP Location:  Left Arm, Patient Position: Sitting, Cuff Size: Normal)   Pulse 78   Temp (!) 96.9 F (36.1 C) (Tympanic)   Ht 5\' 1"  (1.549 m)   Wt 103 lb 6.4 oz (46.9 kg)   SpO2 100%   BMI 19.54 kg/m   Filed Weights   07/30/23 0831  Weight: 103 lb 6.4 oz (46.9 kg)   Excoriation/skin rash associated with itching on the abdomen posterior/right side.  But no evidence of active  shingles.  No lymphadenopathy noted.  Physical Exam HENT:     Head: Normocephalic and atraumatic.     Mouth/Throat:     Pharynx: No oropharyngeal exudate.  Eyes:     Pupils: Pupils are equal, round, and reactive to light.  Cardiovascular:     Rate and Rhythm: Normal rate and regular rhythm.  Pulmonary:     Effort: No respiratory distress.     Breath sounds: No wheezing.  Abdominal:     General: Bowel sounds are normal. There is no distension.     Palpations: Abdomen is soft. There is no mass.     Tenderness: There is no abdominal tenderness. There is no guarding or rebound.  Musculoskeletal:        General: No tenderness. Normal range of motion.     Cervical back: Normal range of motion and neck supple.  Skin:    General: Skin is warm.  Neurological:     Mental Status: She is alert and oriented to person, place, and time.  Psychiatric:        Mood and Affect: Affect normal.      LABORATORY DATA:  I have reviewed the data as listed    Component Value Date/Time   NA 140 07/27/2023 0848   NA 141 09/07/2014 1449   K 4.0 07/27/2023 0848   K 4.0 09/07/2014 1449   CL 106 07/27/2023 0848   CL 106 09/07/2014 1449   CO2 25 07/27/2023 0848   CO2 29 09/07/2014 1449   GLUCOSE 127 (H) 07/27/2023 0848   GLUCOSE 109 (H) 09/07/2014 1449   BUN 26 (H) 07/27/2023 0848   BUN 19 (H) 09/07/2014 1449   CREATININE 0.84 07/27/2023 0848   CREATININE 1.01 09/07/2014 1449   CALCIUM 9.2 07/27/2023 0848   CALCIUM 8.8 09/07/2014 1449   PROT 6.9 07/27/2023 0848   PROT 6.3 09/02/2015 0824   PROT 6.5 09/07/2014 1449   ALBUMIN  3.9 07/27/2023 0848   ALBUMIN 4.2 09/02/2015 0824   ALBUMIN 3.8 09/07/2014 1449   AST 17 07/27/2023 0848   ALT 15 07/27/2023 0848   ALT 22 09/07/2014 1449   ALKPHOS 51 07/27/2023 0848   ALKPHOS 75 09/07/2014 1449   BILITOT 0.7 07/27/2023 0848   GFRNONAA >60 07/27/2023 0848   GFRNONAA 56 (L) 09/07/2014 1449   GFRNONAA 44 (L) 02/09/2014 1351   GFRAA 59 (L) 02/27/2020 1023   GFRAA >60 09/07/2014 1449   GFRAA 51 (L) 02/09/2014 1351    No results found for: "SPEP", "UPEP"  Lab Results  Component Value Date   WBC 6.7 07/27/2023   NEUTROABS 4.0 07/27/2023   HGB 12.3 07/27/2023   HCT 36.7 07/27/2023   MCV 97.6 07/27/2023   PLT 144 (L) 07/27/2023      Chemistry      Component Value Date/Time   NA 140 07/27/2023 0848   NA 141 09/07/2014 1449   K 4.0 07/27/2023 0848   K 4.0 09/07/2014 1449   CL 106 07/27/2023 0848   CL 106 09/07/2014 1449   CO2 25 07/27/2023 0848   CO2 29 09/07/2014 1449   BUN 26 (H) 07/27/2023 0848   BUN 19 (H) 09/07/2014 1449   CREATININE 0.84 07/27/2023 0848   CREATININE 1.01 09/07/2014 1449      Component Value Date/Time   CALCIUM 9.2 07/27/2023 0848   CALCIUM 8.8 09/07/2014 1449   ALKPHOS 51 07/27/2023 0848   ALKPHOS 75 09/07/2014 1449   AST 17 07/27/2023 0848   ALT  15 07/27/2023 0848   ALT 22 09/07/2014 1449   BILITOT 0.7 07/27/2023 0848      RADIOGRAPHIC STUDIES: I have personally reviewed the radiological images as listed and agreed with the findings in the report. No results found.   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 Status post multiple lines of therapy in the past; October 2022 finished -s/p Rituxan weely x4.  Jan 11, 2023-  Progressive bilateral axillary and subpectoral lymphadenopathy ([place] 4);  Small bilateral cervical lymph nodes with low level hypermetabolism ; Enlarging retroperitoneal and  pelvic lymph nodes ([place] 4); Right inguinal lymph nodes with low level hypermetabolism.  No findings for osseous lymphoma-  s/p rituximab weekly infusion x 4 [finished JUNE 2024.] 10/04-PET scan- complete response.   # Severe immunoglobulin deficiency- [status post rituximab]-high risk of infections-recommend IVIG infusion.  Discussed re: using of IVIG infusions; and also the potential side effects- proceed with IVIG [400 mg/kg] monthly x3. Proceed with infusion today # 2 of planned 3 infusions. Will re-check    # PET scan- May 2024- Small bilateral pulmonary nodules with mild hypermetabolism- OCT 2024- PET- no worsening lung nodules. Stable.   # Fatigue: ? Lymphoma vs other- cardiac [Dr.Arida; Feb 2024]-status post re-evaluation with cardiology;  March 08, 2023 2D echo- stable.   # Weight loss: UNlikley from Lymphoma [given improvement of PET scan AUG 2023].s/p Nutrition; [place]- stable.   # Post herpetic neurlagia/Itch- currently  pramoxine topical-stable.   # Vaccinations: Ok with flu shot [nov 24] next covid/RSV.   # DISPOSITION:  # IVIG today # in 4 weeks- MD; labs- cbc.cmp; IVIG [same day or the next day based on infusion schedule]- Dr.B  Orders Placed This Encounter  Procedures   CBC with Differential (Cancer Center Only)    Standing Status:   Future    Standing Expiration Date:   07/29/2024   CMP (Cancer Center only)    Standing Status:   Future    Standing Expiration Date:   07/29/2024   All questions were answered. The patient knows to call the clinic with any problems, questions or concerns.      Earna Coder, MD 07/30/2023 9:30 AM

## 2023-07-30 NOTE — Progress Notes (Signed)
C/o back pain 7/10, states just being more active with Christmas stuff, no injury.  Skin cancer rt ear, using cream rx from dermatology.

## 2023-08-31 ENCOUNTER — Other Ambulatory Visit: Payer: Medicare Other

## 2023-09-03 ENCOUNTER — Inpatient Hospital Stay: Payer: Medicare Other | Attending: Internal Medicine | Admitting: Internal Medicine

## 2023-09-03 ENCOUNTER — Inpatient Hospital Stay: Payer: Medicare Other

## 2023-09-03 ENCOUNTER — Other Ambulatory Visit: Payer: Self-pay | Admitting: Cardiovascular Disease

## 2023-09-03 ENCOUNTER — Encounter: Payer: Self-pay | Admitting: Internal Medicine

## 2023-09-03 VITALS — BP 157/68 | HR 82 | Temp 97.1°F | Ht 61.0 in | Wt 100.6 lb

## 2023-09-03 VITALS — BP 149/54 | HR 68 | Temp 97.8°F | Resp 17

## 2023-09-03 DIAGNOSIS — Z9049 Acquired absence of other specified parts of digestive tract: Secondary | ICD-10-CM | POA: Diagnosis not present

## 2023-09-03 DIAGNOSIS — I252 Old myocardial infarction: Secondary | ICD-10-CM | POA: Insufficient documentation

## 2023-09-03 DIAGNOSIS — C8304 Small cell B-cell lymphoma, lymph nodes of axilla and upper limb: Secondary | ICD-10-CM | POA: Diagnosis not present

## 2023-09-03 DIAGNOSIS — Z9221 Personal history of antineoplastic chemotherapy: Secondary | ICD-10-CM | POA: Diagnosis not present

## 2023-09-03 DIAGNOSIS — M549 Dorsalgia, unspecified: Secondary | ICD-10-CM | POA: Diagnosis not present

## 2023-09-03 DIAGNOSIS — Z8719 Personal history of other diseases of the digestive system: Secondary | ICD-10-CM | POA: Insufficient documentation

## 2023-09-03 DIAGNOSIS — Z79899 Other long term (current) drug therapy: Secondary | ICD-10-CM | POA: Diagnosis not present

## 2023-09-03 DIAGNOSIS — Z90722 Acquired absence of ovaries, bilateral: Secondary | ICD-10-CM | POA: Insufficient documentation

## 2023-09-03 DIAGNOSIS — Z885 Allergy status to narcotic agent status: Secondary | ICD-10-CM | POA: Diagnosis not present

## 2023-09-03 DIAGNOSIS — R634 Abnormal weight loss: Secondary | ICD-10-CM | POA: Diagnosis not present

## 2023-09-03 DIAGNOSIS — D801 Nonfamilial hypogammaglobulinemia: Secondary | ICD-10-CM | POA: Diagnosis present

## 2023-09-03 DIAGNOSIS — Z860101 Personal history of adenomatous and serrated colon polyps: Secondary | ICD-10-CM | POA: Insufficient documentation

## 2023-09-03 DIAGNOSIS — Z806 Family history of leukemia: Secondary | ICD-10-CM | POA: Diagnosis not present

## 2023-09-03 DIAGNOSIS — I2510 Atherosclerotic heart disease of native coronary artery without angina pectoris: Secondary | ICD-10-CM | POA: Insufficient documentation

## 2023-09-03 DIAGNOSIS — Z87891 Personal history of nicotine dependence: Secondary | ICD-10-CM | POA: Diagnosis not present

## 2023-09-03 DIAGNOSIS — C8308 Small cell B-cell lymphoma, lymph nodes of multiple sites: Secondary | ICD-10-CM | POA: Diagnosis present

## 2023-09-03 DIAGNOSIS — Z888 Allergy status to other drugs, medicaments and biological substances status: Secondary | ICD-10-CM | POA: Insufficient documentation

## 2023-09-03 DIAGNOSIS — M542 Cervicalgia: Secondary | ICD-10-CM | POA: Diagnosis not present

## 2023-09-03 DIAGNOSIS — R5383 Other fatigue: Secondary | ICD-10-CM | POA: Diagnosis not present

## 2023-09-03 DIAGNOSIS — Z90710 Acquired absence of both cervix and uterus: Secondary | ICD-10-CM | POA: Diagnosis not present

## 2023-09-03 DIAGNOSIS — Z8249 Family history of ischemic heart disease and other diseases of the circulatory system: Secondary | ICD-10-CM | POA: Insufficient documentation

## 2023-09-03 DIAGNOSIS — M2550 Pain in unspecified joint: Secondary | ICD-10-CM | POA: Diagnosis not present

## 2023-09-03 LAB — CBC WITH DIFFERENTIAL (CANCER CENTER ONLY)
Abs Immature Granulocytes: 0.01 10*3/uL (ref 0.00–0.07)
Basophils Absolute: 0 10*3/uL (ref 0.0–0.1)
Basophils Relative: 1 %
Eosinophils Absolute: 0.1 10*3/uL (ref 0.0–0.5)
Eosinophils Relative: 2 %
HCT: 38.6 % (ref 36.0–46.0)
Hemoglobin: 13.1 g/dL (ref 12.0–15.0)
Immature Granulocytes: 0 %
Lymphocytes Relative: 42 %
Lymphs Abs: 2 10*3/uL (ref 0.7–4.0)
MCH: 32.2 pg (ref 26.0–34.0)
MCHC: 33.9 g/dL (ref 30.0–36.0)
MCV: 94.8 fL (ref 80.0–100.0)
Monocytes Absolute: 0.3 10*3/uL (ref 0.1–1.0)
Monocytes Relative: 5 %
Neutro Abs: 2.4 10*3/uL (ref 1.7–7.7)
Neutrophils Relative %: 50 %
Platelet Count: 148 10*3/uL — ABNORMAL LOW (ref 150–400)
RBC: 4.07 MIL/uL (ref 3.87–5.11)
RDW: 12.9 % (ref 11.5–15.5)
WBC Count: 4.8 10*3/uL (ref 4.0–10.5)
nRBC: 0 % (ref 0.0–0.2)

## 2023-09-03 LAB — CMP (CANCER CENTER ONLY)
ALT: 12 U/L (ref 0–44)
AST: 20 U/L (ref 15–41)
Albumin: 3.9 g/dL (ref 3.5–5.0)
Alkaline Phosphatase: 55 U/L (ref 38–126)
Anion gap: 10 (ref 5–15)
BUN: 25 mg/dL — ABNORMAL HIGH (ref 8–23)
CO2: 26 mmol/L (ref 22–32)
Calcium: 9.3 mg/dL (ref 8.9–10.3)
Chloride: 103 mmol/L (ref 98–111)
Creatinine: 0.95 mg/dL (ref 0.44–1.00)
GFR, Estimated: 58 mL/min — ABNORMAL LOW (ref >60)
Glucose, Bld: 165 mg/dL — ABNORMAL HIGH (ref 70–99)
Potassium: 3.7 mmol/L (ref 3.5–5.1)
Sodium: 139 mmol/L (ref 135–145)
Total Bilirubin: 0.8 mg/dL (ref 0.0–1.2)
Total Protein: 6.8 g/dL (ref 6.5–8.1)

## 2023-09-03 MED ORDER — DIPHENHYDRAMINE HCL 25 MG PO CAPS
25.0000 mg | ORAL_CAPSULE | Freq: Once | ORAL | Status: AC
  Administered 2023-09-03: 25 mg via ORAL
  Filled 2023-09-03: qty 1

## 2023-09-03 MED ORDER — DEXTROSE 5 % IV SOLN
INTRAVENOUS | Status: DC
  Filled 2023-09-03: qty 250

## 2023-09-03 MED ORDER — IMMUNE GLOBULIN (HUMAN) 10 GM/100ML IV SOLN
400.0000 mg/kg | Freq: Once | INTRAVENOUS | Status: AC
  Administered 2023-09-03: 20 g via INTRAVENOUS
  Filled 2023-09-03: qty 200

## 2023-09-03 MED ORDER — ACETAMINOPHEN 325 MG PO TABS
650.0000 mg | ORAL_TABLET | Freq: Once | ORAL | Status: AC
  Administered 2023-09-03: 650 mg via ORAL
  Filled 2023-09-03: qty 2

## 2023-09-03 NOTE — Telephone Encounter (Signed)
 Please contact pt for future appointment. Pt due for 6 month f/u.

## 2023-09-03 NOTE — Progress Notes (Signed)
 [facility] OFFICE PROGRESS NOTE  Patient Care Team: Auston Reyes BIRCH, MD as PCP - General (Unknown Physician Specialty) Darron Deatrice LABOR, MD as PCP - Cardiology (Cardiology) Byrnett, Reyes ORN, MD (General Surgery) Wilder Pink, MD (Oncology) Darron Deatrice LABOR, MD as Consulting Physician (Cardiology) Rennie Cindy SAUNDERS, MD as Consulting Physician (Hematology and Oncology)   Cancer Staging  No matching staging information was found for the patient.     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  #May 24, 2021-rituximab  weekly x4; FEB 2023- PET significant partial response.  #MAY 2024- PET progression- June 24th, 2024-  cycle #1 of rituximab  weekly infusion x 4.  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  Accompanied by son.  Jeanne Pittman 89 y.o.  female pleasant patient above history of recurrent low-grade lymphoma/B cell non-Hodgkin-s/p  rituximab  infusion weekly currently on  proceed with IVIG infusion for severe immunodeficiency.  Patient continues to complain of neck pain- 7/10 especially the mornings.  Gets better rest of the day.  Denies any recent infections on antibiotics.  Patient continues to have ongoing fatigue/body aches postinfusion.   Otherwise, she denies any worsening night sweats every night.  Overall weight is stable.  Review of Systems  Constitutional:  Positive for malaise/fatigue and weight loss. Negative for chills, diaphoresis and fever.  HENT:  Negative for nosebleeds and sore throat.   Eyes:  Negative for double vision.  Respiratory:  Negative for cough, hemoptysis, sputum production, shortness of breath and wheezing.   Cardiovascular:  Negative for chest pain, palpitations, orthopnea and leg swelling.  Gastrointestinal:  Negative for abdominal pain, blood in stool, constipation, diarrhea, heartburn, melena, nausea and vomiting.  Genitourinary:  Negative for dysuria, frequency and urgency.  Musculoskeletal:  Positive for back pain and joint pain.  Skin: Negative.  Negative for itching and rash.  Neurological:  Negative for dizziness, tingling, focal weakness, weakness and headaches.  Endo/Heme/Allergies:  Does not bruise/bleed easily.  Psychiatric/Behavioral:  Negative for depression. The patient is not nervous/anxious and does not have insomnia.     PAST MEDICAL HISTORY :  Past Medical History:  Diagnosis Date   A-fib (HCC)    07/2012: A.  fib with RVR in the setting of myocardial infarction. Converted to sinus rhythm with amiodarone . No recurrence.   Anxiety    Atrophic vaginitis     Chronic combined systolic (congestive) and diastolic (congestive) heart failure (HCC)    a. EF was 25-35% post MI but improved to 45-50% in 04/2013; b. 03/2017 Echo: EF 40-45%, mod focal/basal hypertrophy of septum. Sev mid-apicalanteroseptal, ant, and apical HK. Gr1 DD, mild AI/MR, mod TR, PASP .   Colon adenomas    Coronary artery disease    a. 07/2012 STEMI complicated by cardiogenic shock. Cath/PCI: LAD 194m (DESx2), RCA 95p(DES). EF 25%; b. 03/2017 MV: EF 57%, fixed apical, periapical, mid to distal anteroseptal defect. No ischemia.   Eczema    Fibrocystic breast disease    Gastritis    GERD (gastroesophageal reflux disease)    Glaucoma    Headache    Hyperlipidemia    Hypertension    Hypothyroidism    Insomnia    Ischemic cardiomyopathy    a. 07/2012 EF 25-35% following MI-->Improved to 45-50%;  b. 03/2017 Echo: EF 40-45%.   Myocardial infarction (HCC) 08/18/2014   Non Hodgkin's lymphoma (HCC) 2005   reoccurance 2007 and 2012   Osteoarthritis    Osteoporosis    Polyneuropathy    Polyposis of colon    Restless leg syndrome    Shingles    right   Urinary, incontinence, stress female     PAST SURGICAL HISTORY :   Past Surgical History:  Procedure Laterality Date   ABDOMINAL HYSTERECTOMY  1956   APPENDECTOMY     AXILLARY LYMPH NODE BIOPSY Left 03/18/2015   Procedure: AXILLARY LYMPH NODE BIOPSY;  Surgeon: Reyes LELON Cota, MD;  Location: [name];  Service: General;  Laterality: Left;   CARDIAC CATHETERIZATION  08/19/2012   [name]; [facility]   COLONOSCOPY     COLONOSCOPY WITH PROPOFOL  N/A 03/02/2016   Procedure: COLONOSCOPY WITH PROPOFOL ;  Surgeon: Lamar ONEIDA Holmes, MD;  Location[facility] ENDOSCOPY;  Service: Endoscopy;  Laterality: N/A;   COLONOSCOPY WITH PROPOFOL  N/A 09/26/2017   Procedure: COLONOSCOPY WITH PROPOFOL ;  Surgeon: Holmes Lamar ONEIDA, MD;  Location: [facility] ENDOSCOPY;  Service: Endoscopy;  Laterality: N/A;   COLONOSCOPY WITH PROPOFOL  N/A 03/11/2020   Procedure:  COLONOSCOPY WITH PROPOFOL ;  Surgeon: Tye Millet, DO;  Location: [name] ENDOSCOPY;  Service: General;  Laterality: N/A;   CORONARY ANGIOPLASTY WITH STENT PLACEMENT     x3 stents   CORONARY ANGIOPLASTY WITH STENT PLACEMENT     CORONARY ARTERY BYPASS GRAFT     ESOPHAGOGASTRODUODENOSCOPY (EGD) WITH PROPOFOL  N/A 03/02/2016   Procedure: ESOPHAGOGASTRODUODENOSCOPY (EGD) WITH PROPOFOL ;  Surgeon: Lamar ONEIDA Holmes, MD;  Location: [facility] ENDOSCOPY;  Service: Endoscopy;  Laterality: N/A;   ESOPHAGOGASTRODUODENOSCOPY (EGD) WITH PROPOFOL  N/A 04/14/2019   Procedure: ESOPHAGOGASTRODUODENOSCOPY (EGD) WITH PROPOFOL ;  Surgeon: Toledo, Ladell POUR, MD;  Location: [name] ENDOSCOPY;  Service: Gastroenterology;  Laterality: N/A;   ESOPHAGOGASTRODUODENOSCOPY (EGD) WITH PROPOFOL  N/A 12/07/2020   Procedure: ESOPHAGOGASTRODUODENOSCOPY (EGD) WITH PROPOFOL ;  Surgeon: Maryruth Ole ONEIDA, MD;  Location: [name] ENDOSCOPY;  Service: Endoscopy;  Laterality: N/A;   LYMPH NODE BIOPSY Right 07/24/2011   Dr Cota   MOHS SURGERY     bilateral shoulders   MOHS SURGERY Right 02/27/2022   Squamous cell   PTCA     skin cancer removal     TOTAL ABDOMINAL HYSTERECTOMY W/ BILATERAL SALPINGOOPHORECTOMY      FAMILY HISTORY :   Family History  Problem Relation Age of Onset   Heart attack  Father    Heart disease Brother    Leukemia Mother    Bladder Cancer Neg Hx    Prostate cancer Neg Hx    Kidney cancer Neg Hx    Breast cancer Neg Hx     SOCIAL HISTORY:   Social History   Tobacco Use   Smoking status: Former    Current packs/day: 0.00    Types: Cigarettes    Quit date: 1950    Years since quitting: 75.0   Smokeless tobacco: Never  Vaping Use   Vaping status: Never Used  Substance Use Topics   Alcohol use: No    Alcohol/week: 0.0 standard drinks of alcohol   Drug use: No    ALLERGIES:  is allergic to ace inhibitors, benadryl  [diphenhydramine  hcl], codeine, diphenhydramine , guaiacol, hydrocodone , cardizem  [diltiazem ],  and guaifenesin & derivatives.  MEDICATIONS:  Current Outpatient Medications  Medication Sig Dispense Refill   acetaminophen  (TYLENOL ) 650 MG CR tablet Take 650 mg by mouth every 8 (eight) hours as needed for pain.     aspirin 81 MG tablet Take 81 mg by mouth every morning.      carvedilol  (COREG ) 6.25 MG tablet Take 1 tablet (6.25 mg total) by mouth 2 (two) times daily with a meal. 180 tablet 1   Cholecalciferol (VITAMIN D-3) 5000 UNITS TABS Take 2,000 Int'l Units by mouth every morning.      DULoxetine  (CYMBALTA ) 20 MG capsule Take 20 mg by mouth daily.     fexofenadine (ALLEGRA) 180 MG tablet Take 180 mg by mouth daily as needed for rhinitis.     ibuprofen (ADVIL) 800 MG tablet Take 800 mg by mouth every 6 (six) hours as needed.     latanoprost (XALATAN) 0.005 % ophthalmic solution Place 1 drop into both eyes at bedtime.     LORazepam  (ATIVAN ) 0.5 MG tablet Take 0.5 mg by mouth every 8 (eight) hours as needed for anxiety.     losartan  (COZAAR ) 25 MG tablet Take 1 tablet (25 mg total) by mouth daily. 90 tablet 2   pantoprazole  (PROTONIX ) 40 MG tablet TAKE 1 TABLET BY MOUTH TWICE A DAY 180 tablet 1   rosuvastatin  (CRESTOR ) 10 MG tablet TAKE 1 TABLET BY MOUTH EVERY OTHER DAY 45 tablet 1   timolol (TIMOPTIC) 0.5 % ophthalmic solution Place 1 drop into both eyes 2 (two) times daily.     traMADol (ULTRAM) 50 MG tablet Take by mouth as needed.     No current facility-administered medications for this visit.    PHYSICAL EXAMINATION: ECOG PERFORMANCE STATUS: 0 - Asymptomatic  BP (!) 157/68 (BP Location: Left Arm, Patient Position: Sitting, Cuff Size: Normal)   Pulse 82   Temp (!) 97.1 F (36.2 C) (Tympanic)   Ht 5' 1 (1.549 m)   Wt 100 lb 9.6 oz (45.6 kg)   SpO2 100%   BMI 19.01 kg/m   Filed Weights   09/03/23 0839  Weight: 100 lb 9.6 oz (45.6 kg)   Excoriation/skin rash associated with itching on the abdomen posterior/right side.  But no evidence of active shingles.  No  lymphadenopathy noted.  Physical Exam HENT:     Head: Normocephalic and atraumatic.     Mouth/Throat:     Pharynx: No oropharyngeal exudate.  Eyes:     Pupils: Pupils are equal, round, and reactive to light.  Cardiovascular:     Rate and Rhythm: Normal rate and regular rhythm.  Pulmonary:     Effort: No respiratory distress.  Breath sounds: No wheezing.  Abdominal:     General: Bowel sounds are normal. There is no distension.     Palpations: Abdomen is soft. There is no mass.     Tenderness: There is no abdominal tenderness. There is no guarding or rebound.  Musculoskeletal:        General: No tenderness. Normal range of motion.     Cervical back: Normal range of motion and neck supple.  Skin:    General: Skin is warm.  Neurological:     Mental Status: She is alert and oriented to person, place, and time.  Psychiatric:        Mood and Affect: Affect normal.      LABORATORY DATA:  I have reviewed the data as listed    Component Value Date/Time   NA 139 09/03/2023 0825   NA 141 09/07/2014 1449   K 3.7 09/03/2023 0825   K 4.0 09/07/2014 1449   CL 103 09/03/2023 0825   CL 106 09/07/2014 1449   CO2 26 09/03/2023 0825   CO2 29 09/07/2014 1449   GLUCOSE 165 (H) 09/03/2023 0825   GLUCOSE 109 (H) 09/07/2014 1449   BUN 25 (H) 09/03/2023 0825   BUN 19 (H) 09/07/2014 1449   CREATININE 0.95 09/03/2023 0825   CREATININE 1.01 09/07/2014 1449   CALCIUM  9.3 09/03/2023 0825   CALCIUM  8.8 09/07/2014 1449   PROT 6.8 09/03/2023 0825   PROT 6.3 09/02/2015 0824   PROT 6.5 09/07/2014 1449   ALBUMIN 3.9 09/03/2023 0825   ALBUMIN 4.2 09/02/2015 0824   ALBUMIN 3.8 09/07/2014 1449   AST 20 09/03/2023 0825   ALT 12 09/03/2023 0825   ALT 22 09/07/2014 1449   ALKPHOS 55 09/03/2023 0825   ALKPHOS 75 09/07/2014 1449   BILITOT 0.8 09/03/2023 0825   GFRNONAA 58 (L) 09/03/2023 0825   GFRNONAA 56 (L) 09/07/2014 1449   GFRNONAA 44 (L) 02/09/2014 1351   GFRAA 59 (L) 02/27/2020 1023    GFRAA >60 09/07/2014 1449   GFRAA 51 (L) 02/09/2014 1351    No results found for: SPEP, UPEP  Lab Results  Component Value Date   WBC 4.8 09/03/2023   NEUTROABS 2.4 09/03/2023   HGB 13.1 09/03/2023   HCT 38.6 09/03/2023   MCV 94.8 09/03/2023   PLT 148 (L) 09/03/2023      Chemistry      Component Value Date/Time   NA 139 09/03/2023 0825   NA 141 09/07/2014 1449   K 3.7 09/03/2023 0825   K 4.0 09/07/2014 1449   CL 103 09/03/2023 0825   CL 106 09/07/2014 1449   CO2 26 09/03/2023 0825   CO2 29 09/07/2014 1449   BUN 25 (H) 09/03/2023 0825   BUN 19 (H) 09/07/2014 1449   CREATININE 0.95 09/03/2023 0825   CREATININE 1.01 09/07/2014 1449      Component Value Date/Time   CALCIUM  9.3 09/03/2023 0825   CALCIUM  8.8 09/07/2014 1449   ALKPHOS 55 09/03/2023 0825   ALKPHOS 75 09/07/2014 1449   AST 20 09/03/2023 0825   ALT 12 09/03/2023 0825   ALT 22 09/07/2014 1449   BILITOT 0.8 09/03/2023 0825      RADIOGRAPHIC STUDIES: I have personally reviewed the radiological images as listed and agreed with the findings in the report. No results found.   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 Status post multiple lines of  therapy in the past; October 2022 finished -s/p Rituxan  weely x4.  Jan 11, 2023-  Progressive bilateral axillary and subpectoral lymphadenopathy (Deauville 4);  Small bilateral cervical lymph nodes with low level hypermetabolism ; Enlarging retroperitoneal and pelvic lymph nodes (Deauville 4); Right inguinal lymph nodes with low level hypermetabolism.  No findings for osseous lymphoma-  s/p rituximab  weekly infusion x 4 [finished JUNE 2024.] 10/04-PET scan- complete response. Stable.  Will repeat PET scan in in APRIL, 2025. Will order PET scan today.  # Severe immunoglobulin deficiency- [status post rituximab ]-high risk of  infections-recommend IVIG infusion.  Discussed re: using of IVIG infusions; and also the potential side effects- proceed with IVIG [400 mg/kg] monthly x3. Proceed with infusion today # 3 of planned 3 infusions. Will re-check labs in 3 months..   # PET scan- May 2024- Small bilateral pulmonary nodules with mild hypermetabolism- OCT 2024- PET- no worsening lung nodules. Stable.   # Fatigue: ? Lymphoma vs other- cardiac [Dr.Arida; Feb 2024]-status post re-evaluation with cardiology;  March 08, 2023 2D echo- Stable.   # Weight loss: UNlikley from Lymphoma [given improvement of PET scan AUG 2023].s/p Nutrition; Joli- stable.   # Post herpetic neurlagia/Itch- currently  pramoxine topical-stable.   # Vaccinations: Ok with flu shot [nov 24] next covid/RSV.   # DISPOSITION:  # IVIG today # in 3 months- MD; labs- cbc.cmp;immunoglobulin levels; PET scan prior- Dr.B  Orders Placed This Encounter  Procedures   NM PET Image Restage (PS) Skull Base to Thigh (F-18 FDG)    Standing Status:   Future    Expected Date:   12/02/2023    Expiration Date:   09/02/2024    If indicated for the ordered procedure, I authorize the administration of a radiopharmaceutical per Radiology protocol:   Yes    Preferred imaging location?:   [facility]   CBC with Differential (Cancer Center Only)    Standing Status:   Future    Expected Date:   12/02/2023    Expiration Date:   09/02/2024   CMP (Cancer Center only)    Standing Status:   Future    Expected Date:   12/02/2023    Expiration Date:   09/02/2024   Immunoglobulins, QN, A/E/G/M    Standing Status:   Future    Expected Date:   12/02/2023    Expiration Date:   09/02/2024   CBC with Differential (Cancer Center Only)    Standing Status:   Future    Expected Date:   12/03/2023    Expiration Date:   09/02/2024   CMP (Cancer Center only)    Standing Status:   Future    Expected Date:   12/03/2023    Expiration Date:   09/02/2024   Immunoglobulins, QN, A/E/G/M     Standing Status:   Future    Expected Date:   12/03/2023    Expiration Date:   09/02/2024   All questions were answered. The patient knows to call the clinic with any problems, questions or concerns.      Cindy JONELLE Joe, MD 09/03/2023 9:16 AM

## 2023-09-03 NOTE — Progress Notes (Signed)
 Appetite 50% normal, occasional ensure.   Asking if she will need 3 or 4 IVIG treatments?

## 2023-09-03 NOTE — Assessment & Plan Note (Addendum)
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 Status post multiple lines of therapy in the past; October 2022 finished -s/p Rituxan  weely x4.  Jan 11, 2023-  Progressive bilateral axillary and subpectoral lymphadenopathy (Deauville 4);  Small bilateral cervical lymph nodes with low level hypermetabolism ; Enlarging retroperitoneal and pelvic lymph nodes (Deauville 4); Right inguinal lymph nodes with low level hypermetabolism.  No findings for osseous lymphoma-  s/p rituximab  weekly infusion x 4 [finished JUNE 2024.] 10/04-PET scan- complete response. Stable.  Will repeat PET scan in in APRIL, 2025. Will order PET scan today.  # Severe immunoglobulin deficiency- [status post rituximab ]-high risk of infections-recommend IVIG infusion.  Discussed re: using of IVIG infusions; and also the potential side effects- proceed with IVIG [400 mg/kg] monthly x3. Proceed with infusion today # 3 of planned 3 infusions. Will re-check labs in 3 months..   # PET scan- May 2024- Small bilateral pulmonary nodules with mild hypermetabolism- OCT 2024- PET- no worsening lung nodules. Stable.   # Fatigue: ? Lymphoma vs other- cardiac [Dr.Arida; Feb 2024]-status post re-evaluation with cardiology;  March 08, 2023 2D echo- Stable.   # Weight loss: UNlikley from Lymphoma [given improvement of PET scan AUG 2023].s/p Nutrition; Joli- stable.   # Post herpetic neurlagia/Itch- currently  pramoxine topical-stable.   # Vaccinations: Ok with flu shot [nov 24] next covid/RSV.   # DISPOSITION:  # IVIG today # in 3 months- MD; labs- cbc.cmp;immunoglobulin levels; PET scan prior- Dr.B

## 2023-09-04 ENCOUNTER — Other Ambulatory Visit: Payer: Self-pay

## 2023-09-04 MED ORDER — CARVEDILOL 6.25 MG PO TABS: 6.2500 mg | ORAL_TABLET | Freq: Two times a day (BID) | ORAL | 0 refills | Status: DC

## 2023-09-04 MED ORDER — ROSUVASTATIN CALCIUM 10 MG PO TABS: ORAL_TABLET | ORAL | 0 refills | Status: DC

## 2023-09-21 ENCOUNTER — Encounter: Payer: Self-pay | Admitting: Physician Assistant

## 2023-09-21 ENCOUNTER — Ambulatory Visit: Payer: Medicare Other | Attending: Physician Assistant | Admitting: Physician Assistant

## 2023-09-21 VITALS — BP 132/80 | HR 72 | Ht 62.0 in | Wt 98.0 lb

## 2023-09-21 DIAGNOSIS — I480 Paroxysmal atrial fibrillation: Secondary | ICD-10-CM | POA: Insufficient documentation

## 2023-09-21 DIAGNOSIS — I10 Essential (primary) hypertension: Secondary | ICD-10-CM | POA: Diagnosis present

## 2023-09-21 DIAGNOSIS — E785 Hyperlipidemia, unspecified: Secondary | ICD-10-CM | POA: Insufficient documentation

## 2023-09-21 DIAGNOSIS — I255 Ischemic cardiomyopathy: Secondary | ICD-10-CM | POA: Insufficient documentation

## 2023-09-21 DIAGNOSIS — I2510 Atherosclerotic heart disease of native coronary artery without angina pectoris: Secondary | ICD-10-CM | POA: Insufficient documentation

## 2023-09-21 DIAGNOSIS — I5020 Unspecified systolic (congestive) heart failure: Secondary | ICD-10-CM | POA: Diagnosis not present

## 2023-09-21 NOTE — Progress Notes (Signed)
Cardiology Office Note    Date:  09/21/2023   ID:  SADAKO CEGIELSKI, DOB 1935/06/08, MRN [id number]  PCP:  Marguarite Arbour, MD  Cardiologist:  Lorine Bears, MD  Electrophysiologist:  None   Chief Complaint: Follow-up for  History of Present Illness:   Jeanne Pittman is a 89 y.o. female with history of CAD with late presenting anterior ST elevation MI in 07/2012 complicated by cardiogenic shock status post PCI/DES x 2 to the mid LAD and PCI/DES x 1 to proximal RCA, HFrEF secondary to ICM, PAF, non-Hodgkin's lymphoma, HTN, HLD with statin intolerance, and hypothyroidism who presents for follow-up of her CAD and cardiomyopathy.  She was admitted to the hospital in 07/2012 with late presenting anterior ST elevation MI complicated by PAF and cardiogenic shock.  Emergent LHC showed an occluded mid LAD and 95% stenosis in the proximal RCA.  She underwent successful placement of 2 drug-eluting stents to the mid LAD and 1 drug-eluting stent to the proximal RCA.  EF was 25 to 30% with akinesis of the mid to distal anterior, apical and distal inferior wall.  Echocardiograms over the years have shown a persistent cardiomyopathy.  Most recent ischemic evaluation via Lexiscan MPI in 08/2019 showed a medium defect of severe severity present in the mid anterior, apical anterior, and apex location with an EF of 45 to 54%.  Findings were consistent with prior MI and only minimal peri-infarct ischemia.  This was an intermediate risk study and without significant change when compared to stress test in 2018.  She was admitted to the hospital in 12/2019 with diverticulitis and noted to have A-fib in the setting of acute illness, converting to sinus rhythm.  Echo in 10/2020 showed an EF of 35 to 40% with mild mitral regurgitation and moderate tricuspid regurgitation.  Outpatient Zio patch that showed sinus rhythm with short runs of SVT.  No evidence of A-fib.  She was last seen in the office in 09/2022 and was without  symptoms of angina or cardiac decompensation.  She noted an improved appetite.  She comes in doing well from a cardiac perspective and is without symptoms of angina or cardiac decompensation.  No dizziness, presyncope, or syncope.  No lower extremity swelling or progressive orthopnea.  She has had 2 mechanical falls since she was last seen, tripping over her neighbors dog, without head trauma.  No hematochezia or melena.  Remains adherent and tolerating cardiac medications without off target effect.  Notes an intermittent appetite at times.  Frequently snacks.  She does not have any acute cardiac concerns at this time.   Labs independently reviewed: 08/2023 - Hgb 13.1, PLT 148, potassium 3.7, BUN 25, serum creatinine 0.95, albumin 3.9, AST/ALT normal 03/2023 - TC 131, TG 80, HDL 52, LDL 62, TSH normal  Past Medical History:  Diagnosis Date   A-fib (HCC)    07/2012: A. fib with RVR in the setting of myocardial infarction. Converted to sinus rhythm with amiodarone. No recurrence.   Anxiety    Atrophic vaginitis    Chronic combined systolic (congestive) and diastolic (congestive) heart failure (HCC)    a. EF was 25-35% post MI but improved to 45-50% in 04/2013; b. 03/2017 Echo: EF 40-45%, mod focal/basal hypertrophy of septum. Sev mid-apicalanteroseptal, ant, and apical HK. Gr1 DD, mild AI/MR, mod TR, PASP .   Colon adenomas    Coronary artery disease    a. 07/2012 STEMI complicated by cardiogenic shock. Cath/PCI: LAD 161m (DESx2), RCA 95p(DES). EF  25%; b. 03/2017 MV: EF 57%, fixed apical, periapical, mid to distal anteroseptal defect. No ischemia.   Eczema    Fibrocystic breast disease    Gastritis    GERD (gastroesophageal reflux disease)    Glaucoma    Headache    Hyperlipidemia    Hypertension    Hypothyroidism    Insomnia    Ischemic cardiomyopathy    a. 07/2012 EF 25-35% following MI-->Improved to 45-50%;  b. 03/2017 Echo: EF 40-45%.   Myocardial infarction (HCC) 08/18/2014   Non  Hodgkin's lymphoma (HCC) 2005   reoccurance 2007 and 2012   Osteoarthritis    Osteoporosis    Polyneuropathy    Polyposis of colon    Restless leg syndrome    Shingles    right   Urinary, incontinence, stress female     Past Surgical History:  Procedure Laterality Date   ABDOMINAL HYSTERECTOMY  1956   APPENDECTOMY     AXILLARY LYMPH NODE BIOPSY Left 03/18/2015   Procedure: AXILLARY LYMPH NODE BIOPSY;  Surgeon: Earline Mayotte, MD;  Location: [name];  Service: General;  Laterality: Left;   CARDIAC CATHETERIZATION  08/19/2012   [name]; [facility]   COLONOSCOPY     COLONOSCOPY WITH PROPOFOL N/A 03/02/2016   Procedure: COLONOSCOPY WITH PROPOFOL;  Surgeon: Scot Jun, MD;  Location: [facility] ENDOSCOPY;  Service: Endoscopy;  Laterality: N/A;   COLONOSCOPY WITH PROPOFOL N/A 09/26/2017   Procedure: COLONOSCOPY WITH PROPOFOL;  Surgeon: Scot Jun, MD;  Location: [facility] ENDOSCOPY;  Service: Endoscopy;  Laterality: N/A;   COLONOSCOPY WITH PROPOFOL N/A 03/11/2020   Procedure: COLONOSCOPY WITH PROPOFOL;  Surgeon: Sung Amabile, DO;  Location: [name] ENDOSCOPY;  Service: General;  Laterality: N/A;   CORONARY ANGIOPLASTY WITH STENT PLACEMENT     x3 stents   CORONARY ANGIOPLASTY WITH STENT PLACEMENT     CORONARY ARTERY BYPASS GRAFT     ESOPHAGOGASTRODUODENOSCOPY (EGD) WITH PROPOFOL N/A 03/02/2016   Procedure: ESOPHAGOGASTRODUODENOSCOPY (EGD) WITH PROPOFOL;  Surgeon: Scot Jun, MD;  Location: [facility] ENDOSCOPY;  Service: Endoscopy;  Laterality: N/A;   ESOPHAGOGASTRODUODENOSCOPY (EGD) WITH PROPOFOL N/A 04/14/2019   Procedure: ESOPHAGOGASTRODUODENOSCOPY (EGD) WITH PROPOFOL;  Surgeon: Toledo, Boykin Nearing, MD;  Location: [name] ENDOSCOPY;  Service: Gastroenterology;  Laterality: N/A;   ESOPHAGOGASTRODUODENOSCOPY (EGD) WITH PROPOFOL N/A 12/07/2020   Procedure: ESOPHAGOGASTRODUODENOSCOPY (EGD) WITH PROPOFOL;  Surgeon: Regis Bill, MD;  Location: [name] ENDOSCOPY;  Service: Endoscopy;  Laterality:  N/A;   LYMPH NODE BIOPSY Right 07/24/2011   Dr Lemar Livings   MOHS SURGERY     bilateral shoulders   MOHS SURGERY Right 02/27/2022   Squamous cell   PTCA     skin cancer removal     TOTAL ABDOMINAL HYSTERECTOMY W/ BILATERAL SALPINGOOPHORECTOMY      Current Medications: Current Meds  Medication Sig   acetaminophen (TYLENOL) 650 MG CR tablet Take 650 mg by mouth every 8 (eight) hours as needed for pain.   aspirin 81 MG tablet Take 81 mg by mouth every morning.    carvedilol (COREG) 6.25 MG tablet Take 1 tablet (6.25 mg total) by mouth 2 (two) times daily with a meal.   Cholecalciferol (VITAMIN D-3) 5000 UNITS TABS Take 2,000 Int'l Units by mouth every morning.    DULoxetine (CYMBALTA) 20 MG capsule Take 20 mg by mouth daily.   DULoxetine (CYMBALTA) 30 MG capsule Take 30 mg by mouth daily.   fexofenadine (ALLEGRA) 180 MG tablet Take 180 mg by mouth daily as needed for rhinitis.   ibuprofen (  ADVIL) 800 MG tablet Take 800 mg by mouth every 6 (six) hours as needed.   latanoprost (XALATAN) 0.005 % ophthalmic solution Place 1 drop into both eyes at bedtime.   LORazepam (ATIVAN) 0.5 MG tablet Take 0.5 mg by mouth every 8 (eight) hours as needed for anxiety.   losartan (COZAAR) 25 MG tablet Take 1 tablet (25 mg total) by mouth daily.   pantoprazole (PROTONIX) 40 MG tablet TAKE ONE (1) TABLET BY MOUTH TWO TIMES PER DAY   rosuvastatin (CRESTOR) 10 MG tablet TAKE 1 TABLET BY MOUTH EVERY OTHER DAY   timolol (TIMOPTIC) 0.5 % ophthalmic solution Place 1 drop into both eyes 2 (two) times daily.   traMADol (ULTRAM) 50 MG tablet Take by mouth as needed.    Allergies:   Ace inhibitors, Benadryl [diphenhydramine hcl], Codeine, Diphenhydramine, Guaiacol, Hydrocodone, Cardizem [diltiazem], and Guaifenesin & derivatives   Social History   Socioeconomic History   Marital status: Divorced    Spouse name: Not on file   Number of children: Not on file   Years of education: Not on file   Highest education  level: Not on file  Occupational History   Not on file  Tobacco Use   Smoking status: Former    Current packs/day: 0.00    Types: Cigarettes    Quit date: 1950    Years since quitting: 75.1   Smokeless tobacco: Never  Vaping Use   Vaping status: Never Used  Substance and Sexual Activity   Alcohol use: No    Alcohol/week: 0.0 standard drinks of alcohol   Drug use: No   Sexual activity: Yes    Birth control/protection: Surgical  Other Topics Concern   Not on file  Social History Narrative   Not on file   Social Drivers of Health   Financial Resource Strain: Low Risk  (04/17/2023)   Received from [facility]   Overall Financial Resource Strain (CARDIA)    Difficulty of Paying Living Expenses: Not hard at all  Food Insecurity: No Food Insecurity (04/17/2023)   Received from [facility]   Hunger Vital Sign    Worried About Running Out of Food in the Last Year: Never true    Ran Out of Food in the Last Year: Never true  Transportation Needs: No Transportation Needs (04/17/2023)   Received from [facility] - Transportation    In the past 12 months, has lack of transportation kept you from medical appointments or from getting medications?: No    Lack of Transportation (Non-Medical): No  Physical Activity: Not on file  Stress: Not on file  Social Connections: Unknown (07/12/2018)   [facility], [facility]   Social Connection and Isolation Panel [NHANES]    Frequency of Communication with Friends and Family: Patient declined    Frequency of Social Gatherings with Friends and Family: Patient declined    Attends Religious Services: Patient declined    Database administrator or Organizations: Patient declined    Attends Engineer, structural: Patient declined    Marital Status: Patient declined     Family History:  The patient's family history includes Heart attack in her father; Heart  disease in her brother; Leukemia in her mother. There is no history of Bladder Cancer, Prostate cancer, Kidney cancer, or Breast cancer.  ROS:   12-point review of systems is negative unless otherwise noted in the HPI.   EKGs/Labs/Other Studies Reviewed:  Studies reviewed were summarized above. The additional studies were reviewed today:  2D echo 02/06/2023: 1. Hypokinesis/akinesis of the inferior/inferoseptal wall (mid/distal)  and apex.. Left ventricular ejection fraction, by estimation, is 40%. Left  ventricular diastolic parameters are consistent with Grade I diastolic  dysfunction (impaired relaxation).  Elevated left atrial pressure.   2. Right ventricular systolic function is normal. The right ventricular  size is normal. There is normal pulmonary artery systolic pressure.   3. Mild mitral valve regurgitation.   4. Tricuspid valve regurgitation is mild to moderate.   5. The aortic valve is tricuspid. Aortic valve regurgitation is mild.  Aortic valve sclerosis/calcification is present, without any evidence of  aortic stenosis.   6. Pulmonic valve regurgitation is moderate to severe.   7. The inferior vena cava is normal in size with <50% respiratory  variability, suggesting right atrial pressure of 8 mmHg.   Comparison(s): The left ventricular function is unchanged.  __________   Luci Bank patch 10/08/2020: Patient had a min HR of 51 bpm, max HR of 179 bpm, and avg HR of 73 bpm.  Predominant underlying rhythm was Sinus Rhythm.  21 Supraventricular Tachycardia runs occurred, the run with the fastest interval lasting 5 beats with a max rate of 179 bpm, the longest lasting 14 beats with an avg rate of 117 bpm.  Rare PACs and rare PVCs. __________  2D echo 10/29/2020: 1. Left ventricular ejection fraction, by estimation, is 35 to 40%. The  left ventricle has moderately decreased function. The left ventricle  demonstrates regional wall motion abnormalities (Large region of severe   hypokinesis of the anterior,  anteroseptal and apical region). Left ventricular diastolic parameters are  consistent with Grade I diastolic dysfunction (impaired relaxation).   2. Right ventricular systolic function is normal. The right ventricular  size is normal. There is normal pulmonary artery systolic pressure. The  estimated right ventricular systolic pressure is 32.7 mmHg.   3. The mitral valve is normal in structure. Mild mitral valve  regurgitation. No evidence of mitral stenosis.   4. Tricuspid valve regurgitation is moderate.  __________  2D echo 01/2020: 1. Left ventricular ejection fraction, by estimation, is 35 to 40%. The  left ventricle has moderately decreased function. The left ventricle  demonstrates regional wall motion abnormalities (see scoring  diagram/findings for description). There is  moderate left ventricular hypertrophy. Left ventricular diastolic  parameters are consistent with Grade I diastolic dysfunction (impaired  relaxation). There is severe hypokinesis of the left ventricular,  mid-apical anteroseptal wall and apical segment.   2. There is at least mild pulmonary hypertension (PASP 35 mmHg plus  central venous pressure). Right ventricular systolic function is normal.  The right ventricular size is normal.   3. The mitral valve is grossly normal. Mild mitral valve regurgitation.  No evidence of mitral stenosis.   4. Tricuspid valve regurgitation is moderate to severe.   5. The aortic valve is tricuspid. Aortic valve regurgitation is mild.  Mild aortic valve sclerosis is present, with no evidence of aortic valve  Stenosis. __________   2D echo 07/2019: 1. Left ventricular ejection fraction, by visual estimation, is 40 to  45%. The left ventricle has mild to moderately decreased function. There  is no left ventricular hypertrophy.   2. Left ventricular diastolic parameters are indeterminate.   3. The left ventricle demonstrates regional wall  motion abnormalities.   4. There is mid to apical anteroseptal akinesis and apical akinesis.      proximal/basal septal bulge noted,  measuring 1.6 cm.   5. Global right ventricle has normal systolic function.The right  ventricular size is normal. Right vetricular wall thickness was not  assessed.   6. Left atrial size was normal.   7. Right atrial size was normal.   8. The pericardium was not assessed.   9. The mitral valve is normal in structure. Trivial mitral valve  regurgitation.  10. The tricuspid valve is normal in structure.  11. The aortic valve is tricuspid. Aortic valve regurgitation is trivial.  12. The pulmonic valve was not well visualized. Pulmonic valve  regurgitation is trivial. __________   2D echo 03/2017: - Left ventricle: The cavity size was normal. There was moderate    focal basal hypertrophy of the septum. Systolic function was    mildly to moderately reduced. The estimated ejection fraction was    in the range of 40% to 45%. Severe hypokinesis of the    mid-apicalanteroseptal, anterior, and apical myocardium. Doppler    parameters are consistent with abnormal left ventricular    relaxation (grade 1 diastolic dysfunction).  - Aortic valve: There was mild regurgitation.  - Mitral valve: There was mild regurgitation.  - Tricuspid valve: There was moderate regurgitation.  - Pulmonary arteries: Systolic pressure was mildly increased. PA    peak pressure: 40 mm Hg (S). __________   2D echo 04/2013: - Left ventricle: The cavity size was normal. There was mild    focal basal hypertrophy of the septum. Systolic function    was mildly reduced. The estimated ejection fraction was in    the range of 45% to 50%. Hypokinesis of the anteroseptal    myocardium. Hypokinesis of the anterior myocardium.    Hypokinesis of the apical myocardium. Doppler parameters    are consistent with abnormal left ventricular relaxation    (grade 1 diastolic dysfunction).  - Aortic valve:  Mild regurgitation.  - Right ventricle: Systolic function was normal.  - Tricuspid valve: Moderate regurgitation.  - Pulmonary arteries: Systolic pressure was moderately    elevated. PA peak pressure: 49mm Hg (S).   __________   2D echo 09/2012: - Left ventricle: The cavity size was normal. There was    focal basal and mild concentric hypertrophy. Systolic    function was moderately reduced. The estimated ejection    fraction was in the range of 35% to 40%. There is severe    hypokinesis of the mid-distalanterior and apical    myocardium. Doppler parameters are consistent with    abnormal left ventricular relaxation (grade 1 diastolic    dysfunction).  - Aortic valve: Mild regurgitation.  - Tricuspid valve: Moderate regurgitation.  - Pulmonary arteries: Systolic pressure was mildly    increased. PA peak pressure: 42mm Hg (S). __________   [facility] 07/2012: Proximal LAD 20%, mid LAD 100% s/p PCI/DES x 2, proximal RCA 95% s/p PCI/DES x 1    EKG:  EKG is ordered today.  The EKG ordered today demonstrates NSR, 72 bpm, LVH, septal infarct, nonspecific ST-T changes  Recent Labs: 09/03/2023: ALT 12; BUN 25; Creatinine 0.95; Hemoglobin 13.1; Platelet Count 148; Potassium 3.7; Sodium 139  Recent Lipid Panel    Component Value Date/Time   CHOL 172 09/02/2015 0824   TRIG 111 09/02/2015 0824   HDL 51 09/02/2015 0824   CHOLHDL 3.4 09/02/2015 0824   LDLCALC 99 09/02/2015 0824    PHYSICAL EXAM:    VS:  BP 132/80 (BP Location: Left Arm, Patient Position: Sitting, Cuff Size: Normal)   Pulse  72   Ht 5\' 2"  (1.575 m)   Wt 98 lb (44.5 kg)   BMI 17.92 kg/m   BMI: Body mass index is 17.92 kg/m.  Physical Exam Vitals reviewed.  Constitutional:      Appearance: She is well-developed.  HENT:     Head: Normocephalic and atraumatic.  Eyes:     General:        Right eye: No discharge.        Left eye: No discharge.  Neck:     Vascular: No JVD.  Cardiovascular:     Rate and Rhythm: Normal  rate and regular rhythm.     Heart sounds: Normal heart sounds, S1 normal and S2 normal. Heart sounds not distant. No midsystolic click and no opening snap. No murmur heard.    No friction rub.  Pulmonary:     Effort: Pulmonary effort is normal. No respiratory distress.     Breath sounds: Normal breath sounds. No decreased breath sounds, wheezing, rhonchi or rales.  Chest:     Chest wall: No tenderness.  Abdominal:     General: There is no distension.  Musculoskeletal:     Cervical back: Normal range of motion.     Right lower leg: No edema.     Left lower leg: No edema.  Skin:    General: Skin is warm and dry.     Nails: There is no clubbing.  Neurological:     Mental Status: She is alert and oriented to person, place, and time.  Psychiatric:        Speech: Speech normal.        Behavior: Behavior normal.        Thought Content: Thought content normal.        Judgment: Judgment normal.     Wt Readings from Last 3 Encounters:  09/21/23 98 lb (44.5 kg)  09/03/23 100 lb 9.6 oz (45.6 kg)  07/30/23 103 lb 6.4 oz (46.9 kg)     ASSESSMENT & PLAN:   CAD involving the native coronary arteries without angina: She continues to do well and is without symptoms of angina or cardiac decompensation.  Continue aggressive risk factor modification and secondary prevention including aspirin 81 mg, carvedilol 6.25 mg twice daily, and rosuvastatin 10 mg.   HFrEF secondary to ICM: Euvolemic and well compensated with NYHA class III symptoms, though largely sedentary at baseline.  Remains on carvedilol and losartan.  Defer addition of MRA or SGLT2 inhibitor, or transition from ARB to ARNI in the setting of stable symptoms, and advanced age with poor oral intake at times in an effort to prevent off target effects.  PAF: Occurred in the setting of acute illness.  Subsequent Zio patch showed no evidence of A-fib.  HTN: Blood pressure is well-controlled in the office today.  Continue pharmacotherapy as  outlined above.  HLD: LDL 62 in 03/2023.  She remains on rosuvastatin.    Disposition: F/u with Dr. Kirke Corin or an APP in 6 months.   Medication Adjustments/Labs and Tests Ordered: Current medicines are reviewed at length with the patient today.  Concerns regarding medicines are outlined above. Medication changes, Labs and Tests ordered today are summarized above and listed in the Patient Instructions accessible in Encounters.   Signed, Eula Listen, PA-C 09/21/2023 11:43 AM     [facility] [place] [address] [place], [place] [address] [phone]

## 2023-09-21 NOTE — Patient Instructions (Signed)
Medication Instructions:  Your physician recommends that you continue on your current medications as directed. Please refer to the Current Medication list given to you today.  *If you need a refill on your cardiac medications before your next appointment, please call your pharmacy*      Follow-Up: At [facility], you and your health needs are our priority.  As part of our continuing mission to provide you with exceptional heart care, we have created designated Provider Care Teams.  These Care Teams include your primary Cardiologist (physician) and Advanced Practice Providers (APPs -  Physician Assistants and Nurse Practitioners) who all work together to provide you with the care you need, when you need it.  We recommend signing up for the patient portal called "MyChart".  Sign up information is provided on this After Visit Summary.  MyChart is used to connect with patients for Virtual Visits (Telemedicine).  Patients are able to view lab/test results, encounter notes, upcoming appointments, etc.  Non-urgent messages can be sent to your provider as well.   To learn more about what you can do with MyChart, go to [email or web address].    Your next appointment:   6 month(s)  Provider:   You may see Lorine Bears, MD or one of the following Advanced Practice Providers on your designated Care Team:   Nicolasa Ducking, NP Eula Listen, PA-C Cadence Fransico Michael, PA-C Charlsie Quest, NP Carlos Levering, NP

## 2023-10-31 ENCOUNTER — Other Ambulatory Visit: Payer: Self-pay | Admitting: Cardiovascular Disease

## 2023-11-16 ENCOUNTER — Other Ambulatory Visit: Payer: Self-pay

## 2023-11-21 ENCOUNTER — Ambulatory Visit: Payer: Medicare Other

## 2023-11-27 ENCOUNTER — Other Ambulatory Visit: Payer: Medicare Other

## 2023-11-27 ENCOUNTER — Ambulatory Visit: Payer: Medicare Other | Admitting: Internal Medicine

## 2023-11-28 ENCOUNTER — Ambulatory Visit: Payer: Medicare Other

## 2023-12-03 ENCOUNTER — Ambulatory Visit: Payer: Medicare Other | Admitting: Internal Medicine

## 2023-12-03 ENCOUNTER — Telehealth: Payer: Self-pay | Admitting: *Deleted

## 2023-12-03 ENCOUNTER — Ambulatory Visit: Payer: Medicare Other

## 2023-12-03 ENCOUNTER — Other Ambulatory Visit: Payer: Medicare Other

## 2023-12-03 ENCOUNTER — Encounter: Payer: Self-pay | Admitting: Internal Medicine

## 2023-12-03 NOTE — Telephone Encounter (Signed)
 Jeanne Pittman her son says that pt. terrible cold, chest cold for over 2 months. The PCP every thing she got is not helping, he would like to see if Dr. Valentine Gasmen about this issue

## 2023-12-03 NOTE — Progress Notes (Signed)
 I called the patient's son, unable to reach left a voicemail to call us  back- GB

## 2023-12-06 ENCOUNTER — Other Ambulatory Visit: Payer: Self-pay | Admitting: Cardiovascular Disease

## 2023-12-06 ENCOUNTER — Ambulatory Visit: Payer: Medicare Other | Admitting: Internal Medicine

## 2023-12-06 ENCOUNTER — Other Ambulatory Visit: Payer: Medicare Other

## 2023-12-06 NOTE — Telephone Encounter (Signed)
 Dr. B states he has tried calling the son.

## 2023-12-07 ENCOUNTER — Other Ambulatory Visit: Payer: Self-pay

## 2023-12-07 DIAGNOSIS — C8304 Small cell B-cell lymphoma, lymph nodes of axilla and upper limb: Secondary | ICD-10-CM

## 2023-12-07 NOTE — Telephone Encounter (Signed)
 Labs done.

## 2023-12-14 ENCOUNTER — Inpatient Hospital Stay: Attending: Internal Medicine

## 2023-12-14 ENCOUNTER — Inpatient Hospital Stay (HOSPITAL_BASED_OUTPATIENT_CLINIC_OR_DEPARTMENT_OTHER): Admitting: Internal Medicine

## 2023-12-14 VITALS — BP 126/55 | HR 84 | Temp 98.1°F | Resp 16 | Wt 97.3 lb

## 2023-12-14 DIAGNOSIS — M549 Dorsalgia, unspecified: Secondary | ICD-10-CM | POA: Diagnosis not present

## 2023-12-14 DIAGNOSIS — C8308 Small cell B-cell lymphoma, lymph nodes of multiple sites: Secondary | ICD-10-CM | POA: Diagnosis present

## 2023-12-14 DIAGNOSIS — Z87891 Personal history of nicotine dependence: Secondary | ICD-10-CM | POA: Diagnosis not present

## 2023-12-14 DIAGNOSIS — Z860101 Personal history of adenomatous and serrated colon polyps: Secondary | ICD-10-CM | POA: Insufficient documentation

## 2023-12-14 DIAGNOSIS — R634 Abnormal weight loss: Secondary | ICD-10-CM | POA: Insufficient documentation

## 2023-12-14 DIAGNOSIS — R5383 Other fatigue: Secondary | ICD-10-CM | POA: Insufficient documentation

## 2023-12-14 DIAGNOSIS — Z9049 Acquired absence of other specified parts of digestive tract: Secondary | ICD-10-CM | POA: Insufficient documentation

## 2023-12-14 DIAGNOSIS — I2510 Atherosclerotic heart disease of native coronary artery without angina pectoris: Secondary | ICD-10-CM | POA: Diagnosis not present

## 2023-12-14 DIAGNOSIS — Z8719 Personal history of other diseases of the digestive system: Secondary | ICD-10-CM | POA: Diagnosis not present

## 2023-12-14 DIAGNOSIS — R059 Cough, unspecified: Secondary | ICD-10-CM | POA: Insufficient documentation

## 2023-12-14 DIAGNOSIS — Z7982 Long term (current) use of aspirin: Secondary | ICD-10-CM | POA: Insufficient documentation

## 2023-12-14 DIAGNOSIS — I5042 Chronic combined systolic (congestive) and diastolic (congestive) heart failure: Secondary | ICD-10-CM | POA: Insufficient documentation

## 2023-12-14 DIAGNOSIS — Z8249 Family history of ischemic heart disease and other diseases of the circulatory system: Secondary | ICD-10-CM | POA: Diagnosis not present

## 2023-12-14 DIAGNOSIS — Z79899 Other long term (current) drug therapy: Secondary | ICD-10-CM | POA: Insufficient documentation

## 2023-12-14 DIAGNOSIS — Z806 Family history of leukemia: Secondary | ICD-10-CM | POA: Insufficient documentation

## 2023-12-14 DIAGNOSIS — M1990 Unspecified osteoarthritis, unspecified site: Secondary | ICD-10-CM | POA: Insufficient documentation

## 2023-12-14 DIAGNOSIS — Z888 Allergy status to other drugs, medicaments and biological substances status: Secondary | ICD-10-CM | POA: Diagnosis not present

## 2023-12-14 DIAGNOSIS — Z90722 Acquired absence of ovaries, bilateral: Secondary | ICD-10-CM | POA: Diagnosis not present

## 2023-12-14 DIAGNOSIS — Z90710 Acquired absence of both cervix and uterus: Secondary | ICD-10-CM | POA: Insufficient documentation

## 2023-12-14 DIAGNOSIS — Z885 Allergy status to narcotic agent status: Secondary | ICD-10-CM | POA: Diagnosis not present

## 2023-12-14 DIAGNOSIS — C8304 Small cell B-cell lymphoma, lymph nodes of axilla and upper limb: Secondary | ICD-10-CM

## 2023-12-14 DIAGNOSIS — I252 Old myocardial infarction: Secondary | ICD-10-CM | POA: Diagnosis not present

## 2023-12-14 DIAGNOSIS — M2550 Pain in unspecified joint: Secondary | ICD-10-CM | POA: Diagnosis not present

## 2023-12-14 DIAGNOSIS — Z9221 Personal history of antineoplastic chemotherapy: Secondary | ICD-10-CM | POA: Diagnosis not present

## 2023-12-14 LAB — CMP (CANCER CENTER ONLY)
ALT: 11 U/L (ref 0–44)
AST: 15 U/L (ref 15–41)
Albumin: 3.8 g/dL (ref 3.5–5.0)
Alkaline Phosphatase: 69 U/L (ref 38–126)
Anion gap: 9 (ref 5–15)
BUN: 22 mg/dL (ref 8–23)
CO2: 25 mmol/L (ref 22–32)
Calcium: 9.1 mg/dL (ref 8.9–10.3)
Chloride: 104 mmol/L (ref 98–111)
Creatinine: 0.85 mg/dL (ref 0.44–1.00)
GFR, Estimated: >60 mL/min (ref >60)
Glucose, Bld: 126 mg/dL — ABNORMAL HIGH (ref 70–99)
Potassium: 3.6 mmol/L (ref 3.5–5.1)
Sodium: 138 mmol/L (ref 135–145)
Total Bilirubin: 0.7 mg/dL (ref 0.0–1.2)
Total Protein: 7.1 g/dL (ref 6.5–8.1)

## 2023-12-14 LAB — CBC WITH DIFFERENTIAL (CANCER CENTER ONLY)
Abs Immature Granulocytes: 0.04 10*3/uL (ref 0.00–0.07)
Basophils Absolute: 0.1 10*3/uL (ref 0.0–0.1)
Basophils Relative: 1 %
Eosinophils Absolute: 0.2 10*3/uL (ref 0.0–0.5)
Eosinophils Relative: 2 %
HCT: 37.7 % (ref 36.0–46.0)
Hemoglobin: 12.5 g/dL (ref 12.0–15.0)
Immature Granulocytes: 1 %
Lymphocytes Relative: 31 %
Lymphs Abs: 2.4 10*3/uL (ref 0.7–4.0)
MCH: 31.6 pg (ref 26.0–34.0)
MCHC: 33.2 g/dL (ref 30.0–36.0)
MCV: 95.4 fL (ref 80.0–100.0)
Monocytes Absolute: 0.5 10*3/uL (ref 0.1–1.0)
Monocytes Relative: 7 %
Neutro Abs: 4.4 10*3/uL (ref 1.7–7.7)
Neutrophils Relative %: 58 %
Platelet Count: 197 10*3/uL (ref 150–400)
RBC: 3.95 MIL/uL (ref 3.87–5.11)
RDW: 14.1 % (ref 11.5–15.5)
WBC Count: 7.6 10*3/uL (ref 4.0–10.5)
nRBC: 0 % (ref 0.0–0.2)

## 2023-12-14 LAB — LACTATE DEHYDROGENASE: LDH: 106 U/L (ref 98–192)

## 2023-12-14 MED ORDER — PREDNISONE 20 MG PO TABS: 20.0000 mg | ORAL_TABLET | Freq: Every day | ORAL | 0 refills | Status: DC

## 2023-12-14 NOTE — Progress Notes (Signed)
 [facility] OFFICE PROGRESS NOTE  Patient Care Team: Yehuda Helms, MD as PCP - General (Unknown Physician Specialty) Wenona Hamilton, MD as PCP - Cardiology (Cardiology) Byrnett, Magali Schmitz, MD (General Surgery) Malone Sear, MD (Oncology) Wenona Hamilton, MD as Consulting Physician (Cardiology) Gwyn Leos, MD as Consulting Physician (Hematology and Oncology)   Cancer Staging  No matching staging information was found for the patient.     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  #May 24, 2021-rituximab  weekly x4; FEB 2023- PET significant partial response.  #MAY 2024- PET progression- June 24th, 2024-  cycle #1 of rituximab  weekly infusion x 4.  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  Accompanied by son.  Elyce Hams 89 y.o.  female pleasant patient above history of recurrent low-grade lymphoma/B cell non-Hodgkin-s/p  rituximab  infusion weekly currently on  proceed with IVIG infusion for severe immunodeficiency.  Pt has a cold and cough now for 2 months s/p z-pack.Aaron Aas Dyspnea at times. No appetite. Eats sweets. Bowels okay.   Occ lightheadedness when she stands up. Has a terrible itch from Left ear down to chin , no rash , redness or swellin   Otherwise, she denies any worsening night sweats every night.  Overall weight is stable.  Review of Systems  Constitutional:  Positive for malaise/fatigue and weight loss. Negative for chills, diaphoresis and fever.  HENT:  Negative for nosebleeds and sore throat.   Eyes:  Negative for double vision.  Respiratory:  Negative for cough, hemoptysis, sputum production, shortness of breath and wheezing.   Cardiovascular:  Negative for chest pain, palpitations, orthopnea and leg swelling.  Gastrointestinal:  Negative for abdominal pain, blood in stool, constipation, diarrhea, heartburn, melena, nausea and vomiting.  Genitourinary:  Negative for dysuria, frequency and urgency.  Musculoskeletal:  Positive for back pain and joint pain.  Skin: Negative.  Negative for itching and rash.  Neurological:  Negative for dizziness, tingling, focal weakness, weakness and headaches.  Endo/Heme/Allergies:  Does not bruise/bleed easily.  Psychiatric/Behavioral:  Negative for depression. The patient is not nervous/anxious and does not have insomnia.     PAST MEDICAL HISTORY :  Past Medical History:  Diagnosis Date   A-fib (HCC)    07/2012: A. fib with RVR in the setting of myocardial infarction. Converted to sinus rhythm with amiodarone . No recurrence.   Anxiety     Atrophic vaginitis    Chronic combined systolic (congestive) and diastolic (congestive) heart failure (HCC)    a. EF was 25-35% post MI but improved to 45-50% in 04/2013; b. 03/2017 Echo: EF 40-45%, mod focal/basal hypertrophy of septum. Sev mid-apicalanteroseptal, ant, and apical HK. Gr1 DD, mild AI/MR, mod TR, PASP .   Colon adenomas    Coronary artery disease    a. 07/2012 STEMI complicated by cardiogenic shock. Cath/PCI: LAD 177m (DESx2), RCA 95p(DES). EF 25%; b. 03/2017 MV: EF 57%, fixed apical, periapical, mid to distal anteroseptal defect. No ischemia.   Eczema    Fibrocystic breast disease    Gastritis    GERD (gastroesophageal reflux disease)    Glaucoma    Headache    Hyperlipidemia    Hypertension    Hypothyroidism    Insomnia    Ischemic cardiomyopathy    a. 07/2012 EF 25-35% following MI-->Improved to 45-50%;  b. 03/2017 Echo: EF 40-45%.   Myocardial infarction (HCC) 08/18/2014   Non Hodgkin's lymphoma (HCC) 2005   reoccurance 2007 and 2012   Osteoarthritis    Osteoporosis    Polyneuropathy    Polyposis of colon    Restless leg syndrome    Shingles    right   Urinary, incontinence, stress female     PAST SURGICAL HISTORY :   Past Surgical History:  Procedure Laterality Date   ABDOMINAL HYSTERECTOMY  1956   APPENDECTOMY     AXILLARY LYMPH NODE BIOPSY Left 03/18/2015   Procedure: AXILLARY LYMPH NODE BIOPSY;  Surgeon: Marshall Skeeter, MD;  Location: [name];  Service: General;  Laterality: Left;   CARDIAC CATHETERIZATION  08/19/2012   [name]; [facility]   COLONOSCOPY     COLONOSCOPY WITH PROPOFOL  N/A 03/02/2016   Procedure: COLONOSCOPY WITH PROPOFOL ;  Surgeon: Cassie Click, MD;  Location: [facility] ENDOSCOPY;  Service: Endoscopy;  Laterality: N/A;   COLONOSCOPY WITH PROPOFOL  N/A 09/26/2017   Procedure: COLONOSCOPY WITH PROPOFOL ;  Surgeon: Cassie Click, MD;  Location: [facility] ENDOSCOPY;  Service: Endoscopy;  Laterality: N/A;   COLONOSCOPY WITH PROPOFOL  N/A  03/11/2020   Procedure: COLONOSCOPY WITH PROPOFOL ;  Surgeon: Conrado Delay, DO;  Location: [name] ENDOSCOPY;  Service: General;  Laterality: N/A;   CORONARY ANGIOPLASTY WITH STENT PLACEMENT     x3 stents   CORONARY ANGIOPLASTY WITH STENT PLACEMENT     CORONARY ARTERY BYPASS GRAFT     ESOPHAGOGASTRODUODENOSCOPY (EGD) WITH PROPOFOL  N/A 03/02/2016   Procedure: ESOPHAGOGASTRODUODENOSCOPY (EGD) WITH PROPOFOL ;  Surgeon: Cassie Click, MD;  Location[facility] ENDOSCOPY;  Service: Endoscopy;  Laterality: N/A;   ESOPHAGOGASTRODUODENOSCOPY (EGD) WITH PROPOFOL  N/A 04/14/2019   Procedure: ESOPHAGOGASTRODUODENOSCOPY (EGD) WITH PROPOFOL ;  Surgeon: Toledo, Alphonsus Jeans, MD;  Location: [name] ENDOSCOPY;  Service: Gastroenterology;  Laterality: N/A;   ESOPHAGOGASTRODUODENOSCOPY (EGD) WITH PROPOFOL  N/A 12/07/2020   Procedure: ESOPHAGOGASTRODUODENOSCOPY (EGD) WITH PROPOFOL ;  Surgeon: Shane Darling, MD;  Location: [name] ENDOSCOPY;  Service: Endoscopy;  Laterality: N/A;   LYMPH NODE BIOPSY Right 07/24/2011   Dr Marquita Situ   MOHS SURGERY     bilateral shoulders   MOHS SURGERY Right 02/27/2022   Squamous cell   PTCA     skin cancer removal     TOTAL ABDOMINAL HYSTERECTOMY W/ BILATERAL SALPINGOOPHORECTOMY      FAMILY HISTORY :   Family  History  Problem Relation Age of Onset   Heart attack Father    Heart disease Brother    Leukemia Mother    Bladder Cancer Neg Hx    Prostate cancer Neg Hx    Kidney cancer Neg Hx    Breast cancer Neg Hx     SOCIAL HISTORY:   Social History   Tobacco Use   Smoking status: Former    Current packs/day: 0.00    Types: Cigarettes    Quit date: 1950    Years since quitting: 75.3   Smokeless tobacco: Never  Vaping Use   Vaping status: Never Used  Substance Use Topics   Alcohol use: No    Alcohol/week: 0.0 standard drinks of alcohol   Drug use: No    ALLERGIES:  is allergic to ace inhibitors, benadryl  [diphenhydramine  hcl], codeine, diphenhydramine , guaiacol,  hydrocodone , cardizem  [diltiazem ], and guaifenesin & derivatives.  MEDICATIONS:  Current Outpatient Medications  Medication Sig Dispense Refill   acetaminophen  (TYLENOL ) 650 MG CR tablet Take 650 mg by mouth every 8 (eight) hours as needed for pain.     aspirin 81 MG tablet Take 81 mg by mouth every morning.      carvedilol  (COREG ) 6.25 MG tablet TAKE 1 TABLET BY MOUTH TWICE DAILY WITH A MEAL 180 tablet 3   Cholecalciferol (VITAMIN D-3) 5000 UNITS TABS Take 2,000 Int'l Units by mouth every morning.      DULoxetine (CYMBALTA) 30 MG capsule Take 30 mg by mouth daily.     fexofenadine (ALLEGRA) 180 MG tablet Take 180 mg by mouth daily as needed for rhinitis.     latanoprost (XALATAN) 0.005 % ophthalmic solution Place 1 drop into both eyes at bedtime.     LORazepam  (ATIVAN ) 0.5 MG tablet Take 0.5 mg by mouth every 8 (eight) hours as needed for anxiety.     losartan  (COZAAR ) 25 MG tablet TAKE 1 TABLET BY MOUTH DAILY 90 tablet 3   pantoprazole  (PROTONIX ) 40 MG tablet TAKE ONE (1) TABLET BY MOUTH TWO TIMES PER DAY 180 tablet 0   predniSONE (DELTASONE) 20 MG tablet Take 1 tablet (20 mg total) by mouth daily with breakfast. 15 tablet 0   rosuvastatin  (CRESTOR ) 10 MG tablet TAKE 1 TABLET BY MOUTH EVERY OTHER DAY AS DIRECTED. 45 tablet 3   timolol (TIMOPTIC) 0.5 % ophthalmic solution Place 1 drop into both eyes 2 (two) times daily.     ibuprofen (ADVIL) 800 MG tablet Take 800 mg by mouth every 6 (six) hours as needed.     traMADol (ULTRAM) 50 MG tablet Take by mouth as needed. (Patient not taking: Reported on 12/14/2023)     No current facility-administered medications for this visit.    PHYSICAL EXAMINATION: ECOG PERFORMANCE STATUS: 0 - Asymptomatic  BP (!) 126/55   Pulse 84   Temp 98.1 F (36.7 C) (Tympanic)   Resp 16   Wt 97 lb 4.8 oz (44.1 kg)   SpO2 99%   BMI 17.80 kg/m   Filed Weights   12/14/23 0950  Weight: 97 lb 4.8 oz (44.1 kg)   Excoriation/skin rash associated with itching on  the abdomen posterior/right side.  But no evidence of active shingles.  No lymphadenopathy noted.  Physical Exam HENT:     Head: Normocephalic and atraumatic.     Mouth/Throat:     Pharynx: No oropharyngeal exudate.  Eyes:     Pupils: Pupils are equal, round, and reactive to light.  Cardiovascular:     Rate  and Rhythm: Normal rate and regular rhythm.  Pulmonary:     Effort: No respiratory distress.     Breath sounds: No wheezing.  Abdominal:     General: Bowel sounds are normal. There is no distension.     Palpations: Abdomen is soft. There is no mass.     Tenderness: There is no abdominal tenderness. There is no guarding or rebound.  Musculoskeletal:        General: No tenderness. Normal range of motion.     Cervical back: Normal range of motion and neck supple.  Skin:    General: Skin is warm.  Neurological:     Mental Status: She is alert and oriented to person, place, and time.  Psychiatric:        Mood and Affect: Affect normal.      LABORATORY DATA:  I have reviewed the data as listed    Component Value Date/Time   NA 138 12/14/2023 0927   NA 141 09/07/2014 1449   K 3.6 12/14/2023 0927   K 4.0 09/07/2014 1449   CL 104 12/14/2023 0927   CL 106 09/07/2014 1449   CO2 25 12/14/2023 0927   CO2 29 09/07/2014 1449   GLUCOSE 126 (H) 12/14/2023 0927   GLUCOSE 109 (H) 09/07/2014 1449   BUN 22 12/14/2023 0927   BUN 19 (H) 09/07/2014 1449   CREATININE 0.85 12/14/2023 0927   CREATININE 1.01 09/07/2014 1449   CALCIUM  9.1 12/14/2023 0927   CALCIUM  8.8 09/07/2014 1449   PROT 7.1 12/14/2023 0927   PROT 6.3 09/02/2015 0824   PROT 6.5 09/07/2014 1449   ALBUMIN 3.8 12/14/2023 0927   ALBUMIN 4.2 09/02/2015 0824   ALBUMIN 3.8 09/07/2014 1449   AST 15 12/14/2023 0927   ALT 11 12/14/2023 0927   ALT 22 09/07/2014 1449   ALKPHOS 69 12/14/2023 0927   ALKPHOS 75 09/07/2014 1449   BILITOT 0.7 12/14/2023 0927   GFRNONAA >60 12/14/2023 0927   GFRNONAA 56 (L) 09/07/2014 1449    GFRNONAA 44 (L) 02/09/2014 1351   GFRAA 59 (L) 02/27/2020 1023   GFRAA >60 09/07/2014 1449   GFRAA 51 (L) 02/09/2014 1351    No results found for: "SPEP", "UPEP"  Lab Results  Component Value Date   WBC 7.6 12/14/2023   NEUTROABS 4.4 12/14/2023   HGB 12.5 12/14/2023   HCT 37.7 12/14/2023   MCV 95.4 12/14/2023   PLT 197 12/14/2023      Chemistry      Component Value Date/Time   NA 138 12/14/2023 0927   NA 141 09/07/2014 1449   K 3.6 12/14/2023 0927   K 4.0 09/07/2014 1449   CL 104 12/14/2023 0927   CL 106 09/07/2014 1449   CO2 25 12/14/2023 0927   CO2 29 09/07/2014 1449   BUN 22 12/14/2023 0927   BUN 19 (H) 09/07/2014 1449   CREATININE 0.85 12/14/2023 0927   CREATININE 1.01 09/07/2014 1449      Component Value Date/Time   CALCIUM  9.1 12/14/2023 0927   CALCIUM  8.8 09/07/2014 1449   ALKPHOS 69 12/14/2023 0927   ALKPHOS 75 09/07/2014 1449   AST 15 12/14/2023 0927   ALT 11 12/14/2023 0927   ALT 22 09/07/2014 1449   BILITOT 0.7 12/14/2023 0927      RADIOGRAPHIC STUDIES: I have personally reviewed the radiological images as listed and agreed with the findings in the report. No results found.   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 Status post multiple lines of therapy in the past; October 2022 finished -s/p Rituxan  weely x4.  Jan 11, 2023-  Progressive bilateral axillary and subpectoral lymphadenopathy (Deauville 4);  Small bilateral cervical lymph nodes with low level hypermetabolism ; Enlarging retroperitoneal and pelvic lymph nodes (Deauville 4); Right inguinal lymph nodes with low level hypermetabolism.  No findings for osseous lymphoma-  s/p rituximab  weekly infusion x 4 [finished JUNE 2024.] 10/04-PET scan- complete response.  Stable-awaiting repeating PET scan in 1-2 months after resolution of acute issues. Clinically stable.   #  acute/chornic bronchitis- Z-pack; no steroids- add prednisone 20 mg/day x 2 weeks- / Severe immunoglobulin deficiency- [status post rituximab ]-high risk of infections-s/p IVIG infusion [400 mg/kg] monthly x3. Await imunoglobulins- APRIL 2025- X-ray -PCP- neg.   # PET scan- May 2024- Small bilateral pulmonary nodules with mild hypermetabolism- OCT 2024- PET- no worsening lung nodules. Stable.   # Fatigue: ? Lymphoma vs other- cardiac [Dr.Arida; Feb 2024]-status post re-evaluation with cardiology;  March 08, 2023 2D echo- Stable.   # Weight loss: UNlikley from Lymphoma [given improvement of PET scan AUG 2023].s/p Nutrition; Joli- stable.   # Post herpetic neurlagia/Itch- currently  pramoxine topical-stable.   # Vaccinations: Ok with flu shot [nov 24] next covid/RSV.   # DISPOSITION:  # HOLD PET for now.  #  keep appts for 5/12 as planned- add Possible IVIG infusion [same day is possible]; no labs- Dr.B   No orders of the defined types were placed in this encounter.  All questions were answered. The patient knows to call the clinic with any problems, questions or concerns.      [place] Nick R Suren Payne, MD 12/14/2023 11:00 AM

## 2023-12-14 NOTE — Assessment & Plan Note (Addendum)
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 Status post multiple lines of therapy in the past; October 2022 finished -s/p Rituxan  weely x4.  Jan 11, 2023-  Progressive bilateral axillary and subpectoral lymphadenopathy (Deauville 4);  Small bilateral cervical lymph nodes with low level hypermetabolism ; Enlarging retroperitoneal and pelvic lymph nodes (Deauville 4); Right inguinal lymph nodes with low level hypermetabolism.  No findings for osseous lymphoma-  s/p rituximab  weekly infusion x 4 [finished JUNE 2024.] 10/04-PET scan- complete response.  Stable-awaiting repeating PET scan in 1-2 months after resolution of acute issues. Clinically stable.   # acute/chornic bronchitis- Z-pack; no steroids- add prednisone 20 mg/day x 2 weeks- / Severe immunoglobulin deficiency- [status post rituximab ]-high risk of infections-s/p IVIG infusion [400 mg/kg] monthly x3. Await imunoglobulins- APRIL 2025- X-ray -PCP- neg.   # PET scan- May 2024- Small bilateral pulmonary nodules with mild hypermetabolism- OCT 2024- PET- no worsening lung nodules. Stable.   # Fatigue: ? Lymphoma vs other- cardiac [Dr.Arida; Feb 2024]-status post re-evaluation with cardiology;  March 08, 2023 2D echo- Stable.   # Weight loss: UNlikley from Lymphoma [given improvement of PET scan AUG 2023].s/p Nutrition; Joli- stable.   # Post herpetic neurlagia/Itch- currently  pramoxine topical-stable.   # Vaccinations: Ok with flu shot [nov 24] next covid/RSV.   # DISPOSITION:  # HOLD PET for now.  #  keep appts for 5/12 as planned- add Possible IVIG infusion [same day is possible]; no labs- Dr.B

## 2023-12-14 NOTE — Progress Notes (Signed)
 Pt has a cold and cough now for 2 months. Dyspnea at times. No appetite. Eats sweets. Bowels okay. Occ lightheadedness when she stands up. Has a terrible itch from Left ear down to chin , no rash , redness or swelling.

## 2023-12-17 ENCOUNTER — Other Ambulatory Visit: Payer: Self-pay

## 2023-12-19 LAB — IMMUNOGLOBULINS A/E/G/M, SERUM
IgA: 583 mg/dL — ABNORMAL HIGH (ref 64–422)
IgE (Immunoglobulin E), Serum: <2 [IU]/mL — ABNORMAL LOW (ref 6–495)
IgG (Immunoglobin G), Serum: 381 mg/dL — ABNORMAL LOW (ref 586–1602)
IgM (Immunoglobulin M), Srm: 18 mg/dL — ABNORMAL LOW (ref 26–217)

## 2023-12-21 ENCOUNTER — Ambulatory Visit

## 2023-12-26 ENCOUNTER — Telehealth: Payer: Self-pay | Admitting: *Deleted

## 2023-12-26 NOTE — Telephone Encounter (Signed)
 He needs to know if you are talking about ivig or is she going to get it on Monday .

## 2023-12-26 NOTE — Telephone Encounter (Signed)
 Jeanne Pittman would like to talk to [name] and give him some updates as well as he wants to know is the IVIG planned for Monday when she comes in.

## 2023-12-31 ENCOUNTER — Inpatient Hospital Stay: Attending: Internal Medicine | Admitting: Internal Medicine

## 2023-12-31 ENCOUNTER — Other Ambulatory Visit

## 2023-12-31 ENCOUNTER — Encounter: Payer: Self-pay | Admitting: Internal Medicine

## 2023-12-31 ENCOUNTER — Ambulatory Visit: Admitting: Internal Medicine

## 2023-12-31 ENCOUNTER — Inpatient Hospital Stay

## 2023-12-31 VITALS — BP 158/65 | HR 67 | Temp 97.8°F | Resp 16

## 2023-12-31 DIAGNOSIS — D801 Nonfamilial hypogammaglobulinemia: Secondary | ICD-10-CM | POA: Insufficient documentation

## 2023-12-31 DIAGNOSIS — I5042 Chronic combined systolic (congestive) and diastolic (congestive) heart failure: Secondary | ICD-10-CM | POA: Diagnosis not present

## 2023-12-31 DIAGNOSIS — I2510 Atherosclerotic heart disease of native coronary artery without angina pectoris: Secondary | ICD-10-CM | POA: Insufficient documentation

## 2023-12-31 DIAGNOSIS — M2550 Pain in unspecified joint: Secondary | ICD-10-CM | POA: Insufficient documentation

## 2023-12-31 DIAGNOSIS — Z90710 Acquired absence of both cervix and uterus: Secondary | ICD-10-CM | POA: Insufficient documentation

## 2023-12-31 DIAGNOSIS — R059 Cough, unspecified: Secondary | ICD-10-CM | POA: Insufficient documentation

## 2023-12-31 DIAGNOSIS — Z90722 Acquired absence of ovaries, bilateral: Secondary | ICD-10-CM | POA: Insufficient documentation

## 2023-12-31 DIAGNOSIS — I252 Old myocardial infarction: Secondary | ICD-10-CM | POA: Diagnosis not present

## 2023-12-31 DIAGNOSIS — Z8719 Personal history of other diseases of the digestive system: Secondary | ICD-10-CM | POA: Diagnosis not present

## 2023-12-31 DIAGNOSIS — R0989 Other specified symptoms and signs involving the circulatory and respiratory systems: Secondary | ICD-10-CM | POA: Diagnosis not present

## 2023-12-31 DIAGNOSIS — Z87891 Personal history of nicotine dependence: Secondary | ICD-10-CM | POA: Insufficient documentation

## 2023-12-31 DIAGNOSIS — Z9049 Acquired absence of other specified parts of digestive tract: Secondary | ICD-10-CM | POA: Diagnosis not present

## 2023-12-31 DIAGNOSIS — C8304 Small cell B-cell lymphoma, lymph nodes of axilla and upper limb: Secondary | ICD-10-CM | POA: Diagnosis not present

## 2023-12-31 DIAGNOSIS — Z9221 Personal history of antineoplastic chemotherapy: Secondary | ICD-10-CM | POA: Insufficient documentation

## 2023-12-31 DIAGNOSIS — Z885 Allergy status to narcotic agent status: Secondary | ICD-10-CM | POA: Insufficient documentation

## 2023-12-31 DIAGNOSIS — Z860101 Personal history of adenomatous and serrated colon polyps: Secondary | ICD-10-CM | POA: Diagnosis not present

## 2023-12-31 DIAGNOSIS — R5383 Other fatigue: Secondary | ICD-10-CM | POA: Insufficient documentation

## 2023-12-31 DIAGNOSIS — C8308 Small cell B-cell lymphoma, lymph nodes of multiple sites: Secondary | ICD-10-CM | POA: Insufficient documentation

## 2023-12-31 DIAGNOSIS — Z8249 Family history of ischemic heart disease and other diseases of the circulatory system: Secondary | ICD-10-CM | POA: Diagnosis not present

## 2023-12-31 DIAGNOSIS — Z888 Allergy status to other drugs, medicaments and biological substances status: Secondary | ICD-10-CM | POA: Insufficient documentation

## 2023-12-31 DIAGNOSIS — R634 Abnormal weight loss: Secondary | ICD-10-CM | POA: Insufficient documentation

## 2023-12-31 DIAGNOSIS — Z806 Family history of leukemia: Secondary | ICD-10-CM | POA: Diagnosis not present

## 2023-12-31 DIAGNOSIS — M549 Dorsalgia, unspecified: Secondary | ICD-10-CM | POA: Insufficient documentation

## 2023-12-31 DIAGNOSIS — Z79899 Other long term (current) drug therapy: Secondary | ICD-10-CM | POA: Insufficient documentation

## 2023-12-31 MED ORDER — DULOXETINE HCL 30 MG PO CPEP: 30.0000 mg | ORAL_CAPSULE | Freq: Every day | ORAL | 1 refills | Status: AC

## 2023-12-31 MED ORDER — ALBUTEROL SULFATE HFA 108 (90 BASE) MCG/ACT IN AERS: INHALATION_SPRAY | RESPIRATORY_TRACT | 2 refills | Status: DC

## 2023-12-31 MED ORDER — DEXTROSE 5 % IV SOLN
INTRAVENOUS | Status: DC
  Filled 2023-12-31: qty 250

## 2023-12-31 MED ORDER — PREDNISONE 20 MG PO TABS: ORAL_TABLET | ORAL | 0 refills | Status: DC

## 2023-12-31 MED ORDER — ACETAMINOPHEN 325 MG PO TABS
650.0000 mg | ORAL_TABLET | Freq: Once | ORAL | Status: AC
Start: 2023-12-31 — End: 2023-12-31
  Administered 2023-12-31: 650 mg via ORAL
  Filled 2023-12-31: qty 2

## 2023-12-31 MED ORDER — IMMUNE GLOBULIN (HUMAN) 10 GM/100ML IV SOLN
20.0000 g | Freq: Once | INTRAVENOUS | Status: AC
  Administered 2023-12-31: 20 g via INTRAVENOUS
  Filled 2023-12-31: qty 200

## 2023-12-31 MED ORDER — DIPHENHYDRAMINE HCL 25 MG PO CAPS
25.0000 mg | ORAL_CAPSULE | Freq: Once | ORAL | Status: AC
Start: 2023-12-31 — End: 2023-12-31
  Administered 2023-12-31: 25 mg via ORAL
  Filled 2023-12-31: qty 1

## 2023-12-31 NOTE — Assessment & Plan Note (Addendum)
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 Status post multiple lines of therapy in the past; October 2022 finished -s/p Rituxan  weely x4.  Jan 11, 2023-  Progressive bilateral axillary and subpectoral lymphadenopathy (Deauville 4);  Small bilateral cervical lymph nodes with low level hypermetabolism ; Enlarging retroperitoneal and pelvic lymph nodes (Deauville 4); Right inguinal lymph nodes with low level hypermetabolism.  No findings for osseous lymphoma-  s/p rituximab  weekly infusion x 4 [finished JUNE 2024.] 10/04-PET scan- complete response.  Clinically stable.will HOLD OFF PET scan until after IVIG infusions.    # Acute/chornic bronchitis- Z-pack; NOT resolved- add prednisone - Add albutreol- / Severe immunoglobulin deficiency- [status post rituximab ]-high risk of infections-s/p IVIG infusion [400 mg/kg] monthly x3. MAY 2025- start infusion IVIG monthly x3-   # PET scan- May 2024- Small bilateral pulmonary nodules with mild hypermetabolism- OCT 2024- PET- no worsening lung nodules. Awaiting repeat PET scan after acute issues resolve.  # Fatigue: ? Lymphoma vs other- cardiac [Dr.Arida; Feb 2024]-status post re-evaluation with cardiology;  March 08, 2023 2D echo- Stable.   # Weight loss: UNlikley from Lymphoma [given improvement of PET scan AUG 2023].s/p Nutrition; Joli-Stable.    # Post herpetic neurlagia/Itch- currently  pramoxine topical-Stable.   # Vaccinations: Ok with flu shot [nov 24] next covid/RSV.   # DISPOSITION:  # IVIG infusion today # in 1 month- MD; labs- cbc/bmp;  IVIG infusion [same day if possible]-  Dr.B

## 2023-12-31 NOTE — Progress Notes (Signed)
 C/o runny nose and cough x2 months. Slightly getting better.  Refill duloxetine, pended.  Fell 10 days ago outside, no injuries other than bruises.

## 2023-12-31 NOTE — Progress Notes (Signed)
 [facility] OFFICE PROGRESS NOTE  Patient Care Team: Yehuda Helms, MD as PCP - General (Unknown Physician Specialty) Wenona Hamilton, MD as PCP - Cardiology (Cardiology) Byrnett, Magali Schmitz, MD (General Surgery) Malone Sear, MD (Oncology) Wenona Hamilton, MD as Consulting Physician (Cardiology) Gwyn Leos, MD as Consulting Physician (Hematology and Oncology)   Cancer Staging  No matching staging information was found for the patient.     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  #May 24, 2021-rituximab  weekly x4; FEB 2023- PET significant partial response.  #MAY 2024- PET progression- June 24th, 2024-  cycle #1 of rituximab  weekly infusion x 4.  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  Accompanied by son.  Jeanne Pittman 89 y.o.  female pleasant patient above history of recurrent low-grade lymphoma/B cell non-Hodgkin-s/p  rituximab  infusion weekly currently on  proceed with IVIG infusion for severe immunodeficiency.  C/o runny nose and cough x2 months. Slightly getting better- on prednisone . No fevers or chills.    Fell 10 days ago outside, no injuries other than bruises. No trauma to head.   Otherwise, she denies any worsening night sweats every night.  Overall weight is stable.  Review of Systems  Constitutional:  Positive for malaise/fatigue and weight loss. Negative for chills, diaphoresis and fever.  HENT:  Negative for nosebleeds and sore throat.   Eyes:  Negative for double vision.  Respiratory:  Negative for cough, hemoptysis, sputum production, shortness of breath and wheezing.   Cardiovascular:  Negative for chest pain, palpitations, orthopnea and leg swelling.  Gastrointestinal:  Negative for abdominal pain, blood in stool, constipation, diarrhea, heartburn, melena, nausea and vomiting.  Genitourinary:  Negative for dysuria, frequency and urgency.  Musculoskeletal:  Positive for back pain and joint pain.  Skin: Negative.  Negative for itching and rash.  Neurological:  Negative for dizziness, tingling, focal weakness, weakness and headaches.  Endo/Heme/Allergies:  Does not bruise/bleed easily.  Psychiatric/Behavioral:  Negative for depression. The patient is not nervous/anxious and does not have insomnia.     PAST MEDICAL HISTORY :  Past Medical History:  Diagnosis Date   A-fib (HCC)    07/2012: A. fib  with RVR in the setting of myocardial infarction. Converted to sinus rhythm with amiodarone . No recurrence.   Anxiety    Atrophic vaginitis    Chronic combined systolic (congestive)  and diastolic (congestive) heart failure (HCC)    a. EF was 25-35% post MI but improved to 45-50% in 04/2013; b. 03/2017 Echo: EF 40-45%, mod focal/basal hypertrophy of septum. Sev mid-apicalanteroseptal, ant, and apical HK. Gr1 DD, mild AI/MR, mod TR, PASP .   Colon adenomas    Coronary artery disease    a. 07/2012 STEMI complicated by cardiogenic shock. Cath/PCI: LAD 165m (DESx2), RCA 95p(DES). EF 25%; b. 03/2017 MV: EF 57%, fixed apical, periapical, mid to distal anteroseptal defect. No ischemia.   Eczema    Fibrocystic breast disease    Gastritis    GERD (gastroesophageal reflux disease)    Glaucoma    Headache    Hyperlipidemia    Hypertension    Hypothyroidism    Insomnia    Ischemic cardiomyopathy    a. 07/2012 EF 25-35% following MI-->Improved to 45-50%;  b. 03/2017 Echo: EF 40-45%.   Myocardial infarction (HCC) 08/18/2014   Non Hodgkin's lymphoma (HCC) 2005   reoccurance 2007 and 2012   Osteoarthritis    Osteoporosis    Polyneuropathy    Polyposis of colon    Restless leg syndrome    Shingles    right   Urinary, incontinence, stress female     PAST SURGICAL HISTORY :   Past Surgical History:  Procedure Laterality Date   ABDOMINAL HYSTERECTOMY  1956   APPENDECTOMY     AXILLARY LYMPH NODE BIOPSY Left 03/18/2015   Procedure: AXILLARY LYMPH NODE BIOPSY;  Surgeon: Marshall Skeeter, MD;  Location: [name];  Service: General;  Laterality: Left;   CARDIAC CATHETERIZATION  08/19/2012   [name]; [facility]   COLONOSCOPY     COLONOSCOPY WITH PROPOFOL  N/A 03/02/2016   Procedure: COLONOSCOPY WITH PROPOFOL ;  Surgeon: Cassie Click, MD;  Location: [facility] ENDOSCOPY;  Service: Endoscopy;  Laterality: N/A;   COLONOSCOPY WITH PROPOFOL  N/A 09/26/2017   Procedure: COLONOSCOPY WITH PROPOFOL ;  Surgeon: Cassie Click, MD;  Location: [facility] ENDOSCOPY;  Service: Endoscopy;  Laterality: N/A;   COLONOSCOPY WITH PROPOFOL  N/A 03/11/2020   Procedure: COLONOSCOPY WITH PROPOFOL ;  Surgeon: Conrado Delay, DO;  Location: [name] ENDOSCOPY;  Service: General;  Laterality: N/A;   CORONARY ANGIOPLASTY WITH STENT PLACEMENT     x3 stents   CORONARY ANGIOPLASTY WITH STENT PLACEMENT     CORONARY ARTERY BYPASS GRAFT     ESOPHAGOGASTRODUODENOSCOPY (EGD) WITH PROPOFOL  N/A 03/02/2016   Procedure: ESOPHAGOGASTRODUODENOSCOPY (EGD) WITH PROPOFOL ;  Surgeon: Cassie Click, MD;  Location: [facility] ENDOSCOPY;  Service: Endoscopy;  Laterality: N/A;   ESOPHAGOGASTRODUODENOSCOPY (EGD) WITH PROPOFOL  N/A 04/14/2019   Procedure: ESOPHAGOGASTRODUODENOSCOPY (EGD) WITH PROPOFOL ;  Surgeon: Toledo, Alphonsus Jeans, MD;  Location: [name] ENDOSCOPY;  Service: Gastroenterology;  Laterality: N/A;   ESOPHAGOGASTRODUODENOSCOPY (EGD) WITH PROPOFOL  N/A 12/07/2020   Procedure: ESOPHAGOGASTRODUODENOSCOPY (EGD) WITH PROPOFOL ;  Surgeon: Shane Darling, MD;  Location: [name] ENDOSCOPY;  Service: Endoscopy;  Laterality: N/A;   LYMPH NODE BIOPSY Right 07/24/2011   Dr Marquita Situ   MOHS SURGERY     bilateral shoulders   MOHS SURGERY Right 02/27/2022   Squamous cell   PTCA     skin cancer removal     TOTAL ABDOMINAL HYSTERECTOMY W/ BILATERAL SALPINGOOPHORECTOMY      FAMILY HISTORY :   Family History  Problem Relation Age of Onset   Heart attack Father  Heart disease Brother    Leukemia Mother    Bladder Cancer Neg Hx    Prostate cancer Neg Hx    Kidney cancer Neg Hx    Breast cancer Neg Hx     SOCIAL HISTORY:   Social History   Tobacco Use   Smoking status: Former    Current packs/day: 0.00    Types: Cigarettes    Quit date: 1950    Years since quitting: 75.4   Smokeless tobacco: Never  Vaping Use   Vaping status: Never Used  Substance Use Topics   Alcohol use: No    Alcohol/week: 0.0 standard drinks of alcohol   Drug use: No    ALLERGIES:  is allergic to ace inhibitors, benadryl  [diphenhydramine  hcl], codeine, diphenhydramine , guaiacol, hydrocodone , cardizem  [diltiazem ], and guaifenesin &  derivatives.  MEDICATIONS:  Current Outpatient Medications  Medication Sig Dispense Refill   acetaminophen  (TYLENOL ) 650 MG CR tablet Take 650 mg by mouth every 8 (eight) hours as needed for pain.     albuterol  (VENTOLIN  HFA) 108 (90 Base) MCG/ACT inhaler Inhale 2 puffs into the lungs every 6- 8 hours as needed for cough or shortness of breath 8 g 2   aspirin 81 MG tablet Take 81 mg by mouth every morning.      carvedilol  (COREG ) 6.25 MG tablet TAKE 1 TABLET BY MOUTH TWICE DAILY WITH A MEAL 180 tablet 3   Cholecalciferol (VITAMIN D-3) 5000 UNITS TABS Take 2,000 Int'l Units by mouth every morning.      fexofenadine (ALLEGRA) 180 MG tablet Take 180 mg by mouth daily as needed for rhinitis.     ibuprofen (ADVIL) 800 MG tablet Take 800 mg by mouth every 6 (six) hours as needed.     latanoprost (XALATAN) 0.005 % ophthalmic solution Place 1 drop into both eyes at bedtime.     LORazepam  (ATIVAN ) 0.5 MG tablet Take 0.5 mg by mouth every 8 (eight) hours as needed for anxiety.     losartan  (COZAAR ) 25 MG tablet TAKE 1 TABLET BY MOUTH DAILY 90 tablet 3   pantoprazole  (PROTONIX ) 40 MG tablet TAKE ONE (1) TABLET BY MOUTH TWO TIMES PER DAY 180 tablet 0   rosuvastatin  (CRESTOR ) 10 MG tablet TAKE 1 TABLET BY MOUTH EVERY OTHER DAY AS DIRECTED. 45 tablet 3   timolol (TIMOPTIC) 0.5 % ophthalmic solution Place 1 drop into both eyes 2 (two) times daily.     DULoxetine (CYMBALTA) 30 MG capsule Take 1 capsule (30 mg total) by mouth daily. 30 capsule 1   predniSONE  (DELTASONE ) 20 MG tablet 2 pills a day x 1 week; and then 1 pill a day x 1 week; and the 1/2 a pill x 2 week; and then STOP- Take in AM- 30 tablet 0   traMADol (ULTRAM) 50 MG tablet Take by mouth as needed. (Patient not taking: Reported on 12/31/2023)     No current facility-administered medications for this visit.    PHYSICAL EXAMINATION: ECOG PERFORMANCE STATUS: 0 - Asymptomatic  BP (!) 149/55 (BP Location: Left Arm, Patient Position: Sitting, Cuff  Size: Small)   Pulse 83   Temp 97.6 F (36.4 C) (Tympanic)   Resp 16   Ht 5\' 2"  (1.575 m)   Wt 96 lb 4.8 oz (43.7 kg)   SpO2 100%   BMI 17.61 kg/m   Filed Weights   12/31/23 0914  Weight: 96 lb 4.8 oz (43.7 kg)   No lymphadenopathy noted.  Physical Exam HENT:     Head:  Normocephalic and atraumatic.     Mouth/Throat:     Pharynx: No oropharyngeal exudate.  Eyes:     Pupils: Pupils are equal, round, and reactive to light.  Cardiovascular:     Rate and Rhythm: Normal rate and regular rhythm.  Pulmonary:     Effort: No respiratory distress.     Breath sounds: No wheezing.  Abdominal:     General: Bowel sounds are normal. There is no distension.     Palpations: Abdomen is soft. There is no mass.     Tenderness: There is no abdominal tenderness. There is no guarding or rebound.  Musculoskeletal:        General: No tenderness. Normal range of motion.     Cervical back: Normal range of motion and neck supple.  Skin:    General: Skin is warm.  Neurological:     Mental Status: She is alert and oriented to person, place, and time.  Psychiatric:        Mood and Affect: Affect normal.      LABORATORY DATA:  I have reviewed the data as listed    Component Value Date/Time   NA 138 12/14/2023 0927   NA 141 09/07/2014 1449   K 3.6 12/14/2023 0927   K 4.0 09/07/2014 1449   CL 104 12/14/2023 0927   CL 106 09/07/2014 1449   CO2 25 12/14/2023 0927   CO2 29 09/07/2014 1449   GLUCOSE 126 (H) 12/14/2023 0927   GLUCOSE 109 (H) 09/07/2014 1449   BUN 22 12/14/2023 0927   BUN 19 (H) 09/07/2014 1449   CREATININE 0.85 12/14/2023 0927   CREATININE 1.01 09/07/2014 1449   CALCIUM  9.1 12/14/2023 0927   CALCIUM  8.8 09/07/2014 1449   PROT 7.1 12/14/2023 0927   PROT 6.3 09/02/2015 0824   PROT 6.5 09/07/2014 1449   ALBUMIN 3.8 12/14/2023 0927   ALBUMIN 4.2 09/02/2015 0824   ALBUMIN 3.8 09/07/2014 1449   AST 15 12/14/2023 0927   ALT 11 12/14/2023 0927   ALT 22 09/07/2014 1449    ALKPHOS 69 12/14/2023 0927   ALKPHOS 75 09/07/2014 1449   BILITOT 0.7 12/14/2023 0927   GFRNONAA >60 12/14/2023 0927   GFRNONAA 56 (L) 09/07/2014 1449   GFRNONAA 44 (L) 02/09/2014 1351   GFRAA 59 (L) 02/27/2020 1023   GFRAA >60 09/07/2014 1449   GFRAA 51 (L) 02/09/2014 1351    No results found for: "SPEP", "UPEP"  Lab Results  Component Value Date   WBC 7.6 12/14/2023   NEUTROABS 4.4 12/14/2023   HGB 12.5 12/14/2023   HCT 37.7 12/14/2023   MCV 95.4 12/14/2023   PLT 197 12/14/2023      Chemistry      Component Value Date/Time   NA 138 12/14/2023 0927   NA 141 09/07/2014 1449   K 3.6 12/14/2023 0927   K 4.0 09/07/2014 1449   CL 104 12/14/2023 0927   CL 106 09/07/2014 1449   CO2 25 12/14/2023 0927   CO2 29 09/07/2014 1449   BUN 22 12/14/2023 0927   BUN 19 (H) 09/07/2014 1449   CREATININE 0.85 12/14/2023 0927   CREATININE 1.01 09/07/2014 1449      Component Value Date/Time   CALCIUM  9.1 12/14/2023 0927   CALCIUM  8.8 09/07/2014 1449   ALKPHOS 69 12/14/2023 0927   ALKPHOS 75 09/07/2014 1449   AST 15 12/14/2023 0927   ALT 11 12/14/2023 0927   ALT 22 09/07/2014 1449   BILITOT 0.7 12/14/2023 2956  RADIOGRAPHIC STUDIES: I have personally reviewed the radiological images as listed and agreed with the findings in the report. No results found.   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 Status post multiple lines of therapy in the past; October 2022 finished -s/p Rituxan  weely x4.  Jan 11, 2023-  Progressive bilateral axillary and subpectoral lymphadenopathy ([place] 4);  Small bilateral cervical lymph nodes with low level hypermetabolism ; Enlarging retroperitoneal and pelvic lymph nodes ([place] 4); Right inguinal lymph nodes with low level hypermetabolism.  No findings for osseous lymphoma-  s/p rituximab  weekly infusion x 4 [finished  JUNE 2024.] 10/04-PET scan- complete response.  Clinically stable.will HOLD OFF PET scan until after IVIG infusions.    # Acute/chornic bronchitis- Z-pack; NOT resolved- add prednisone - Add albutreol- / Severe immunoglobulin deficiency- [status post rituximab ]-high risk of infections-s/p IVIG infusion [400 mg/kg] monthly x3. MAY 2025- start infusion IVIG monthly x3-   # PET scan- May 2024- Small bilateral pulmonary nodules with mild hypermetabolism- OCT 2024- PET- no worsening lung nodules. Awaiting repeat PET scan after acute issues resolve.  # Fatigue: ? Lymphoma vs other- cardiac [Dr.Arida; Feb 2024]-status post re-evaluation with cardiology;  March 08, 2023 2D echo- Stable.   # Weight loss: UNlikley from Lymphoma [given improvement of PET scan AUG 2023].s/p Nutrition; Joli-Stable.    # Post herpetic neurlagia/Itch- currently  pramoxine topical-Stable.   # Vaccinations: Ok with flu shot [nov 24] next covid/RSV.   # DISPOSITION:  # IVIG infusion today # in 1 month- MD; labs- cbc/bmp;  IVIG infusion [same day if possible]-  Dr.B   Orders Placed This Encounter  Procedures   CBC with Differential (Cancer Center Only)    Standing Status:   Future    Expected Date:   01/28/2024    Expiration Date:   12/30/2024   Basic Metabolic Panel - Cancer Center Only    Standing Status:   Future    Expected Date:   01/28/2024    Expiration Date:   12/30/2024   All questions were answered. The patient knows to call the clinic with any problems, questions or concerns.      Gwyn Leos, MD 12/31/2023 10:19 AM

## 2023-12-31 NOTE — Patient Instructions (Signed)
 Immune Globulin Injection What is this medication? IMMUNE GLOBULIN (im MUNE GLOB yoo lin) treats many immune system conditions. It works by Designer, multimedia extra antibodies. Antibodies are proteins made by the immune system that help protect the body. This medicine may be used for other purposes; ask your health care provider or pharmacist if you have questions. COMMON BRAND NAME(S): ASCENIV, Baygam, BIVIGAM, Carimune, Carimune NF, cutaquig, Cuvitru, Flebogamma, Flebogamma DIF, GamaSTAN, GamaSTAN S/D, Gamimune N, Gammagard, Gammagard S/D, Gammaked, Gammaplex, Gammar-P IV, Gamunex, Gamunex-C, Hizentra, Iveegam, Iveegam EN, Octagam, Panglobulin, Panglobulin NF, panzyga, Polygam S/D, Privigen, Sandoglobulin, Venoglobulin-S, Vigam, Vivaglobulin, Xembify What should I tell my care team before I take this medication? They need to know if you have any of these conditions: Blood clotting disorder Condition where you have excess fluid in your body, such as heart failure or edema Dehydration Diabetes Have had blood clots Heart disease Immune system conditions Kidney disease Low levels of IgA Recent or upcoming vaccine An unusual or allergic reaction to immune globulin, other medications, foods, dyes, or preservatives Pregnant or trying to get pregnant Breastfeeding How should I use this medication? This medication is infused into a vein or under the skin. It is usually given by your care team in a hospital or clinic setting. It may also be given at home. If you get this medication at home, you will be taught how to prepare and give it. Use exactly as directed. Take it as directed on the prescription label at the same time every day. Keep taking it unless your care team tells you to stop. It is important that you put your used needles and syringes in a special sharps container. Do not put them in a trash can. If you do not have a sharps container, call your pharmacist or care team to get one. Talk to  your care team about the use of this medication in children. While it may be given to children for selected conditions, precautions do apply. Overdosage: If you think you have taken too much of this medicine contact a poison control center or emergency room at once. NOTE: This medicine is only for you. Do not share this medicine with others. What if I miss a dose? If you get this medication at the hospital or clinic: It is important not to miss your dose. Call your care team if you are unable to keep an appointment. If you give yourself this medication at home: If you miss a dose, take it as soon as you can. Then continue your normal schedule. If it is almost time for your next dose, take only that dose. Do not take double or extra doses. Call your care team with questions. What may interact with this medication? Live virus vaccines This list may not describe all possible interactions. Give your health care provider a list of all the medicines, herbs, non-prescription drugs, or dietary supplements you use. Also tell them if you smoke, drink alcohol, or use illegal drugs. Some items may interact with your medicine. What should I watch for while using this medication? Your condition will be monitored carefully while you are receiving this medication. Tell your care team if your symptoms do not start to get better or if they get worse. You may need blood work done while you are taking this medication. This medication increases the risk of blood clots. People with heart, blood vessel, or blood clotting conditions are more likely to develop a blood clot. Other risk factors include advanced age, estrogen  use, tobacco use, lack of movement, and being overweight. This medication can decrease the response to a vaccine. If you need to get vaccinated, tell your care team if you have received this medication within the last year. Extra booster doses may be needed. Talk to your care team to see if a different  vaccination schedule is needed. If you have diabetes, you may get a falsely elevated blood sugar reading. Talk to your care team about how to check your blood sugar while taking this medication. What side effects may I notice from receiving this medication? Side effects that you should report to your care team as soon as possible: Allergic reactions--skin rash, itching, hives, swelling of the face, lips, tongue, or throat Blood clot--pain, swelling, or warmth in the leg, shortness of breath, chest pain Fever, neck pain or stiffness, sensitivity to light, headache, nausea, vomiting, confusion, which may be signs of meningitis Hemolytic anemia--unusual weakness or fatigue, dizziness, headache, trouble breathing, dark urine, yellowing skin or eyes Kidney injury--decrease in the amount of urine, swelling of the ankles, hands, or feet Low sodium level--muscle weakness, fatigue, dizziness, headache, confusion Shortness of breath or trouble breathing, cough, unusual weakness or fatigue, blue skin or lips Side effects that usually do not require medical attention (report these to your care team if they continue or are bothersome): Chills Diarrhea Fever Headache Nausea This list may not describe all possible side effects. Call your doctor for medical advice about side effects. You may report side effects to FDA at 1-800-FDA-1088. Where should I keep my medication? Keep out of the reach of children and pets. You will be instructed on how to store this medication. Get rid of any unused medication after the expiration date. To get rid of medications that are no longer needed or have expired: Take the medication to a medication take-back program. Check with your pharmacy or law enforcement to find a location. If you cannot return the medication, ask your pharmacist or care team how to get rid of this medication safely. NOTE: This sheet is a summary. It may not cover all possible information. If you have  questions about this medicine, talk to your doctor, pharmacist, or health care provider.  2024 Elsevier/Gold Standard (2023-07-20 00:00:00)

## 2024-01-01 ENCOUNTER — Other Ambulatory Visit: Payer: Self-pay

## 2024-01-09 ENCOUNTER — Other Ambulatory Visit: Payer: Self-pay

## 2024-01-31 ENCOUNTER — Inpatient Hospital Stay (HOSPITAL_BASED_OUTPATIENT_CLINIC_OR_DEPARTMENT_OTHER): Admitting: Internal Medicine

## 2024-01-31 ENCOUNTER — Inpatient Hospital Stay

## 2024-01-31 ENCOUNTER — Inpatient Hospital Stay: Attending: Internal Medicine

## 2024-01-31 ENCOUNTER — Encounter: Payer: Self-pay | Admitting: Internal Medicine

## 2024-01-31 VITALS — BP 152/64 | HR 77 | Temp 96.2°F | Resp 19

## 2024-01-31 DIAGNOSIS — C8308 Small cell B-cell lymphoma, lymph nodes of multiple sites: Secondary | ICD-10-CM | POA: Diagnosis present

## 2024-01-31 DIAGNOSIS — R634 Abnormal weight loss: Secondary | ICD-10-CM | POA: Diagnosis not present

## 2024-01-31 DIAGNOSIS — I252 Old myocardial infarction: Secondary | ICD-10-CM | POA: Diagnosis not present

## 2024-01-31 DIAGNOSIS — R059 Cough, unspecified: Secondary | ICD-10-CM | POA: Diagnosis not present

## 2024-01-31 DIAGNOSIS — D8481 Immunodeficiency due to conditions classified elsewhere: Secondary | ICD-10-CM | POA: Insufficient documentation

## 2024-01-31 DIAGNOSIS — C8304 Small cell B-cell lymphoma, lymph nodes of axilla and upper limb: Secondary | ICD-10-CM | POA: Diagnosis not present

## 2024-01-31 DIAGNOSIS — R5383 Other fatigue: Secondary | ICD-10-CM | POA: Insufficient documentation

## 2024-01-31 DIAGNOSIS — R591 Generalized enlarged lymph nodes: Secondary | ICD-10-CM | POA: Insufficient documentation

## 2024-01-31 DIAGNOSIS — D801 Nonfamilial hypogammaglobulinemia: Secondary | ICD-10-CM | POA: Diagnosis not present

## 2024-01-31 DIAGNOSIS — Z79899 Other long term (current) drug therapy: Secondary | ICD-10-CM | POA: Insufficient documentation

## 2024-01-31 LAB — BASIC METABOLIC PANEL - CANCER CENTER ONLY
Anion gap: 8 (ref 5–15)
BUN: 32 mg/dL — ABNORMAL HIGH (ref 8–23)
CO2: 25 mmol/L (ref 22–32)
Calcium: 8.8 mg/dL — ABNORMAL LOW (ref 8.9–10.3)
Chloride: 106 mmol/L (ref 98–111)
Creatinine: 1.02 mg/dL — ABNORMAL HIGH (ref 0.44–1.00)
GFR, Estimated: 54 mL/min — ABNORMAL LOW (ref >60)
Glucose, Bld: 102 mg/dL — ABNORMAL HIGH (ref 70–99)
Potassium: 4 mmol/L (ref 3.5–5.1)
Sodium: 139 mmol/L (ref 135–145)

## 2024-01-31 LAB — CBC WITH DIFFERENTIAL (CANCER CENTER ONLY)
Abs Immature Granulocytes: 0.05 10*3/uL (ref 0.00–0.07)
Basophils Absolute: 0.1 10*3/uL (ref 0.0–0.1)
Basophils Relative: 1 %
Eosinophils Absolute: 0.1 10*3/uL (ref 0.0–0.5)
Eosinophils Relative: 2 %
HCT: 36.1 % (ref 36.0–46.0)
Hemoglobin: 12 g/dL (ref 12.0–15.0)
Immature Granulocytes: 1 %
Lymphocytes Relative: 35 %
Lymphs Abs: 2.9 10*3/uL (ref 0.7–4.0)
MCH: 31.7 pg (ref 26.0–34.0)
MCHC: 33.2 g/dL (ref 30.0–36.0)
MCV: 95.3 fL (ref 80.0–100.0)
Monocytes Absolute: 0.7 10*3/uL (ref 0.1–1.0)
Monocytes Relative: 8 %
Neutro Abs: 4.7 10*3/uL (ref 1.7–7.7)
Neutrophils Relative %: 53 %
Platelet Count: 188 10*3/uL (ref 150–400)
RBC: 3.79 MIL/uL — ABNORMAL LOW (ref 3.87–5.11)
RDW: 14.4 % (ref 11.5–15.5)
WBC Count: 8.5 10*3/uL (ref 4.0–10.5)
nRBC: 0 % (ref 0.0–0.2)

## 2024-01-31 MED ORDER — ACETAMINOPHEN 325 MG PO TABS
650.0000 mg | ORAL_TABLET | Freq: Once | ORAL | Status: AC
  Administered 2024-01-31: 650 mg via ORAL

## 2024-01-31 MED ORDER — DIPHENHYDRAMINE HCL 25 MG PO CAPS
25.0000 mg | ORAL_CAPSULE | Freq: Once | ORAL | Status: AC
  Administered 2024-01-31: 25 mg via ORAL

## 2024-01-31 MED ORDER — IMMUNE GLOBULIN (HUMAN) 10 GM/100ML IV SOLN
20.0000 g | Freq: Once | INTRAVENOUS | Status: AC
  Administered 2024-01-31: 20 g via INTRAVENOUS
  Filled 2024-01-31: qty 200

## 2024-01-31 MED ORDER — DEXTROSE 5 % IV SOLN
INTRAVENOUS | Status: DC
Start: 2024-01-31 — End: 2024-01-31
  Filled 2024-01-31: qty 250

## 2024-01-31 NOTE — Progress Notes (Signed)
 I connected with Jeanne Pittman on 01/31/24 at  8:45 AM EDT by video enabled telemedicine visit and verified that I am speaking with the correct person using two identifiers.  I discussed the limitations, risks, security and privacy concerns of performing an evaluation and management service by telemedicine and the availability of in-person appointments. I also discussed with the patient that there may be a patient responsible charge related to this service. The patient expressed understanding and agreed to proceed.    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  #May 24, 2021-rituximab  weekly x4; FEB 2023- PET significant partial response.  #MAY 2024- PET progression- June 24th, 2024-  cycle #1 of rituximab  weekly infusion x 4.  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  female with history of low-grade B-cell lymphoma/SLL and secondary immunodeficiency is here for follow-up.  Patient states she was again recently treated with steroids and repeat dose of antibiotics for her bronchitis.  Her cough and breathing symptoms are better-but not back to baseline.  No hospitalizations.  Denies any lumps or bumps.  Observation/objective: Alert & oriented x 3. In No acute distress.   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 Status post multiple lines of therapy in the past; October 2022 finished -s/p Rituxan  weely x4.  Jan 11, 2023-  Progressive bilateral axillary and subpectoral lymphadenopathy (Deauville 4);  Small bilateral cervical lymph nodes with low level hypermetabolism ; Enlarging retroperitoneal and pelvic lymph nodes (Deauville 4); Right inguinal lymph nodes with low level hypermetabolism.  No findings for osseous lymphoma-  s/p rituximab  weekly infusion x 4 [finished JUNE 2024.] 10/04-PET scan- complete response.  Clinically stable. will HOLD OFF PET scan until after IVIG infusions.    # Acute/chornic bronchitis- Z-pack; improved prednisone - albuterol  prn-- / Severe immunoglobulin deficiency- [status post rituximab ]-high risk of infections-s/p IVIG infusion [400 mg/kg] monthly . MAY 2025-  infusion IVIG monthly x3- proceed with #2  of planned IVIG today- one more in july- and then PET scan- will order PET scan at next visit.    # PET scan- May 2024- Small bilateral pulmonary nodules with mild hypermetabolism- OCT 2024- PET- no worsening lung nodules. Awaiting repeat PET scan after acute issues resolve.  # Fatigue: ? Lymphoma vs other- cardiac [Dr.Arida; Feb 2024]-status post re-evaluation with cardiology;  March 08, 2023 2D echo- Stable.   # Weight loss: UNlikley from Lymphoma [given improvement of PET scan AUG 2023].s/p Nutrition; Joli-Stable.    # Post herpetic neurlagia/Itch- currently  pramoxine topical-Stable.   # Vaccinations: Ok with flu shot [nov 24] next covid/RSV.   # DISPOSITION:  # IVIG infusion today # in 1 month- MD; labs- cbc/bmp;  IVIG infusion [same day if possible]-  Dr.B   Follow-up instructions:  I discussed the assessment and treatment plan with the patient.  The patient was provided an opportunity to ask questions and all were answered.  The patient agreed with the plan and demonstrated understanding of instructions.  The patient was advised to call back or seek an in person evaluation if the symptoms worsen or if the condition fails to improve as anticipated.    Dr. Camiah Humm [facility] at [facility] 01/31/2024 9:50 AM

## 2024-01-31 NOTE — Assessment & Plan Note (Addendum)
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 Status post multiple lines of therapy in the past; October 2022 finished -s/p Rituxan  weely x4.  Jan 11, 2023-  Progressive bilateral axillary and subpectoral lymphadenopathy (Deauville 4);  Small bilateral cervical lymph nodes with low level hypermetabolism ; Enlarging retroperitoneal and pelvic lymph nodes (Deauville 4); Right inguinal lymph nodes with low level hypermetabolism.  No findings for osseous lymphoma-  s/p rituximab  weekly infusion x 4 [finished JUNE 2024.] 10/04-PET scan- complete response.  Clinically stable. will HOLD OFF PET scan until after IVIG infusions.    # Acute/chornic bronchitis- Z-pack; improved prednisone - albuterol  prn-- / Severe immunoglobulin deficiency- [status post rituximab ]-high risk of infections-s/p IVIG infusion [400 mg/kg] monthly . MAY 2025-  infusion IVIG monthly x3- proceed with #2 of planned IVIG today- one more in july- and then PET scan- will order PET scan at next visit.   # PET scan- May 2024- Small bilateral pulmonary nodules with mild hypermetabolism- OCT 2024- PET- no worsening lung nodules. Awaiting repeat PET scan after acute issues resolve.  # Fatigue: ? Lymphoma vs other- cardiac [Dr.Arida; Feb 2024]-status post re-evaluation with cardiology;  March 08, 2023 2D echo- Stable.   # Weight loss: UNlikley from Lymphoma [given improvement of PET scan AUG 2023].s/p Nutrition; Joli-Stable.    # Post herpetic neurlagia/Itch- currently  pramoxine topical-Stable.   # Vaccinations: Ok with flu shot [nov 24] next covid/RSV.   # DISPOSITION:  # IVIG infusion today # in 1 month- MD; labs- cbc/bmp;  IVIG infusion [same day if possible]-  Dr.B

## 2024-01-31 NOTE — Progress Notes (Signed)
 Patient states she just finished up z-pack that was given by Dr. Claudius Cumins. Other than that, she states she's doing pretty good.

## 2024-02-01 ENCOUNTER — Other Ambulatory Visit: Payer: Self-pay

## 2024-03-04 ENCOUNTER — Encounter: Payer: Self-pay | Admitting: Internal Medicine

## 2024-03-04 ENCOUNTER — Inpatient Hospital Stay (HOSPITAL_BASED_OUTPATIENT_CLINIC_OR_DEPARTMENT_OTHER): Admitting: Internal Medicine

## 2024-03-04 ENCOUNTER — Inpatient Hospital Stay: Attending: Internal Medicine

## 2024-03-04 ENCOUNTER — Inpatient Hospital Stay

## 2024-03-04 VITALS — BP 167/60 | HR 61 | Temp 96.3°F | Resp 19

## 2024-03-04 VITALS — BP 123/56 | HR 75 | Temp 97.0°F | Resp 24 | Ht 62.0 in | Wt 96.9 lb

## 2024-03-04 DIAGNOSIS — Z79899 Other long term (current) drug therapy: Secondary | ICD-10-CM | POA: Diagnosis not present

## 2024-03-04 DIAGNOSIS — I2510 Atherosclerotic heart disease of native coronary artery without angina pectoris: Secondary | ICD-10-CM | POA: Diagnosis not present

## 2024-03-04 DIAGNOSIS — M2550 Pain in unspecified joint: Secondary | ICD-10-CM | POA: Diagnosis not present

## 2024-03-04 DIAGNOSIS — Z87891 Personal history of nicotine dependence: Secondary | ICD-10-CM | POA: Diagnosis not present

## 2024-03-04 DIAGNOSIS — Z8719 Personal history of other diseases of the digestive system: Secondary | ICD-10-CM | POA: Insufficient documentation

## 2024-03-04 DIAGNOSIS — Z90722 Acquired absence of ovaries, bilateral: Secondary | ICD-10-CM | POA: Insufficient documentation

## 2024-03-04 DIAGNOSIS — M549 Dorsalgia, unspecified: Secondary | ICD-10-CM | POA: Insufficient documentation

## 2024-03-04 DIAGNOSIS — Z9049 Acquired absence of other specified parts of digestive tract: Secondary | ICD-10-CM | POA: Insufficient documentation

## 2024-03-04 DIAGNOSIS — D801 Nonfamilial hypogammaglobulinemia: Secondary | ICD-10-CM | POA: Diagnosis not present

## 2024-03-04 DIAGNOSIS — R634 Abnormal weight loss: Secondary | ICD-10-CM | POA: Insufficient documentation

## 2024-03-04 DIAGNOSIS — C8304 Small cell B-cell lymphoma, lymph nodes of axilla and upper limb: Secondary | ICD-10-CM

## 2024-03-04 DIAGNOSIS — R5383 Other fatigue: Secondary | ICD-10-CM | POA: Diagnosis not present

## 2024-03-04 DIAGNOSIS — D649 Anemia, unspecified: Secondary | ICD-10-CM | POA: Diagnosis not present

## 2024-03-04 DIAGNOSIS — I255 Ischemic cardiomyopathy: Secondary | ICD-10-CM | POA: Diagnosis not present

## 2024-03-04 DIAGNOSIS — L989 Disorder of the skin and subcutaneous tissue, unspecified: Secondary | ICD-10-CM | POA: Diagnosis not present

## 2024-03-04 DIAGNOSIS — Z9221 Personal history of antineoplastic chemotherapy: Secondary | ICD-10-CM | POA: Diagnosis not present

## 2024-03-04 DIAGNOSIS — Z90710 Acquired absence of both cervix and uterus: Secondary | ICD-10-CM | POA: Diagnosis not present

## 2024-03-04 DIAGNOSIS — Z7982 Long term (current) use of aspirin: Secondary | ICD-10-CM | POA: Insufficient documentation

## 2024-03-04 DIAGNOSIS — Z888 Allergy status to other drugs, medicaments and biological substances status: Secondary | ICD-10-CM | POA: Insufficient documentation

## 2024-03-04 DIAGNOSIS — Z8249 Family history of ischemic heart disease and other diseases of the circulatory system: Secondary | ICD-10-CM | POA: Diagnosis not present

## 2024-03-04 DIAGNOSIS — Z885 Allergy status to narcotic agent status: Secondary | ICD-10-CM | POA: Diagnosis not present

## 2024-03-04 DIAGNOSIS — C8308 Small cell B-cell lymphoma, lymph nodes of multiple sites: Secondary | ICD-10-CM | POA: Diagnosis present

## 2024-03-04 DIAGNOSIS — I252 Old myocardial infarction: Secondary | ICD-10-CM | POA: Diagnosis not present

## 2024-03-04 DIAGNOSIS — Z860101 Personal history of adenomatous and serrated colon polyps: Secondary | ICD-10-CM | POA: Diagnosis not present

## 2024-03-04 DIAGNOSIS — Z806 Family history of leukemia: Secondary | ICD-10-CM | POA: Diagnosis not present

## 2024-03-04 LAB — CBC WITH DIFFERENTIAL (CANCER CENTER ONLY)
Abs Immature Granulocytes: 0.02 K/uL (ref 0.00–0.07)
Basophils Absolute: 0.1 K/uL (ref 0.0–0.1)
Basophils Relative: 1 %
Eosinophils Absolute: 0.1 K/uL (ref 0.0–0.5)
Eosinophils Relative: 2 %
HCT: 35.3 % — ABNORMAL LOW (ref 36.0–46.0)
Hemoglobin: 11.6 g/dL — ABNORMAL LOW (ref 12.0–15.0)
Immature Granulocytes: 0 %
Lymphocytes Relative: 40 %
Lymphs Abs: 2.5 K/uL (ref 0.7–4.0)
MCH: 31.4 pg (ref 26.0–34.0)
MCHC: 32.9 g/dL (ref 30.0–36.0)
MCV: 95.7 fL (ref 80.0–100.0)
Monocytes Absolute: 0.3 K/uL (ref 0.1–1.0)
Monocytes Relative: 5 %
Neutro Abs: 3.2 K/uL (ref 1.7–7.7)
Neutrophils Relative %: 52 %
Platelet Count: 191 K/uL (ref 150–400)
RBC: 3.69 MIL/uL — ABNORMAL LOW (ref 3.87–5.11)
RDW: 13.8 % (ref 11.5–15.5)
WBC Count: 6.3 K/uL (ref 4.0–10.5)
nRBC: 0 % (ref 0.0–0.2)

## 2024-03-04 LAB — BASIC METABOLIC PANEL WITH GFR
Anion gap: 7 (ref 5–15)
BUN: 25 mg/dL — ABNORMAL HIGH (ref 8–23)
CO2: 25 mmol/L (ref 22–32)
Calcium: 9.1 mg/dL (ref 8.9–10.3)
Chloride: 105 mmol/L (ref 98–111)
Creatinine, Ser: 1.06 mg/dL — ABNORMAL HIGH (ref 0.44–1.00)
GFR, Estimated: 51 mL/min — ABNORMAL LOW (ref >60)
Glucose, Bld: 128 mg/dL — ABNORMAL HIGH (ref 70–99)
Potassium: 4.1 mmol/L (ref 3.5–5.1)
Sodium: 137 mmol/L (ref 135–145)

## 2024-03-04 MED ORDER — DIPHENHYDRAMINE HCL 25 MG PO CAPS
25.0000 mg | ORAL_CAPSULE | Freq: Once | ORAL | Status: AC
  Administered 2024-03-04: 25 mg via ORAL
  Filled 2024-03-04: qty 1

## 2024-03-04 MED ORDER — IMMUNE GLOBULIN (HUMAN) 10 GM/100ML IV SOLN
20.0000 g | Freq: Once | INTRAVENOUS | Status: AC
  Administered 2024-03-04: 20 g via INTRAVENOUS
  Filled 2024-03-04: qty 200

## 2024-03-04 MED ORDER — ACETAMINOPHEN 325 MG PO TABS
650.0000 mg | ORAL_TABLET | Freq: Once | ORAL | Status: AC
  Administered 2024-03-04: 650 mg via ORAL
  Filled 2024-03-04: qty 2

## 2024-03-04 MED ORDER — DEXTROSE 5 % IV SOLN
INTRAVENOUS | Status: DC
  Filled 2024-03-04: qty 250

## 2024-03-04 NOTE — Patient Instructions (Signed)
#  Recommend gentle iron [iron biglycinate; 28 mg ] 1 pill a day.  This pill is unlikely to cause stomach upset or cause constipation. Available Over the counter or talk to pharmacist.

## 2024-03-04 NOTE — Progress Notes (Signed)
 No concerns today

## 2024-03-04 NOTE — Addendum Note (Signed)
 Addended by: LAEL BROWNING A on: 03/04/2024 09:43 AM   Modules accepted: Orders

## 2024-03-04 NOTE — Progress Notes (Signed)
 [facility] OFFICE PROGRESS NOTE  Patient Care Team: Auston Reyes BIRCH, MD as PCP - General (Unknown Physician Specialty) Darron Deatrice LABOR, MD as PCP - Cardiology (Cardiology) Byrnett, Reyes ORN, MD (General Surgery) Wilder Pink, MD (Oncology) Darron Deatrice LABOR, MD as Consulting Physician (Cardiology) Rennie Cindy SAUNDERS, MD as Consulting Physician (Hematology and Oncology)   Cancer Staging  No matching staging information was found for the patient.     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  #May 24, 2021-rituximab  weekly x4; FEB 2023- PET significant partial response.  #MAY 2024- PET progression- June 24th, 2024-  cycle #1 of rituximab  weekly infusion x 4.  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  Alone.   Jeanne Pittman 89 y.o.  female pleasant patient above history of recurrent low-grade lymphoma/B cell non-Hodgkin-s/p  rituximab  infusion weekly currently on is here to proceed with IVIG infusion for severe immunodeficiency.  Patient states cough is better. No fevers or chills.   Otherwise, she denies any worsening night sweats every night.  Overall weight is stable.  Intermittent dizzied spells.   Review of Systems  Constitutional:  Positive for malaise/fatigue and weight loss. Negative for chills, diaphoresis and fever.  HENT:  Negative for nosebleeds and sore throat.   Eyes:  Negative for double vision.  Respiratory:  Negative for cough, hemoptysis, sputum production, shortness of breath and wheezing.   Cardiovascular:  Negative for chest pain, palpitations, orthopnea and leg swelling.  Gastrointestinal:  Negative for abdominal pain, blood in stool, constipation, diarrhea, heartburn, melena, nausea and vomiting.  Genitourinary:  Negative for dysuria, frequency and urgency.  Musculoskeletal:  Positive for back pain and joint pain.  Skin: Negative.  Negative for itching and rash.  Neurological:  Negative for dizziness, tingling, focal weakness, weakness and headaches.  Endo/Heme/Allergies:  Does not bruise/bleed easily.  Psychiatric/Behavioral:  Negative for depression. The patient is not nervous/anxious and does not have insomnia.     PAST MEDICAL HISTORY :  Past Medical History:  Diagnosis Date   A-fib (HCC)    07/2012: A. fib with RVR in the setting of myocardial infarction. Converted to sinus rhythm with amiodarone . No recurrence.  Anxiety    Atrophic vaginitis    Chronic combined systolic (congestive) and diastolic (congestive) heart failure (HCC)    a. EF was 25-35% post MI but improved to  45-50% in 04/2013; b. 03/2017 Echo: EF 40-45%, mod focal/basal hypertrophy of septum. Sev mid-apicalanteroseptal, ant, and apical HK. Gr1 DD, mild AI/MR, mod TR, PASP .   Colon adenomas    Coronary artery disease    a. 07/2012 STEMI complicated by cardiogenic shock. Cath/PCI: LAD 139m (DESx2), RCA 95p(DES). EF 25%; b. 03/2017 MV: EF 57%, fixed apical, periapical, mid to distal anteroseptal defect. No ischemia.   Eczema    Fibrocystic breast disease    Gastritis    GERD (gastroesophageal reflux disease)    Glaucoma    Headache    Hyperlipidemia    Hypertension    Hypothyroidism    Insomnia    Ischemic cardiomyopathy    a. 07/2012 EF 25-35% following MI-->Improved to 45-50%;  b. 03/2017 Echo: EF 40-45%.   Myocardial infarction (HCC) 08/18/2014   Non Hodgkin's lymphoma (HCC) 2005   reoccurance 2007 and 2012   Osteoarthritis    Osteoporosis    Polyneuropathy    Polyposis of colon    Restless leg syndrome    Shingles    right   Urinary, incontinence, stress female     PAST SURGICAL HISTORY :   Past Surgical History:  Procedure Laterality Date   ABDOMINAL HYSTERECTOMY  1956   APPENDECTOMY     AXILLARY LYMPH NODE BIOPSY Left 03/18/2015   Procedure: AXILLARY LYMPH NODE BIOPSY;  Surgeon: Reyes LELON Cota, MD;  Location: [name];  Service: General;  Laterality: Left;   CARDIAC CATHETERIZATION  08/19/2012   [name]; [facility]   COLONOSCOPY     COLONOSCOPY WITH PROPOFOL  N/A 03/02/2016   Procedure: COLONOSCOPY WITH PROPOFOL ;  Surgeon: Lamar ONEIDA Holmes, MD;  Location: [facility] ENDOSCOPY;  Service: Endoscopy;  Laterality: N/A;   COLONOSCOPY WITH PROPOFOL  N/A 09/26/2017   Procedure: COLONOSCOPY WITH PROPOFOL ;  Surgeon: Holmes Lamar ONEIDA, MD;  Location: [facility] ENDOSCOPY;  Service: Endoscopy;  Laterality: N/A;   COLONOSCOPY WITH PROPOFOL  N/A 03/11/2020   Procedure: COLONOSCOPY WITH PROPOFOL ;  Surgeon: Tye Millet, DO;  Location: [name] ENDOSCOPY;  Service: General;  Laterality: N/A;   CORONARY  ANGIOPLASTY WITH STENT PLACEMENT     x3 stents   CORONARY ANGIOPLASTY WITH STENT PLACEMENT     CORONARY ARTERY BYPASS GRAFT     ESOPHAGOGASTRODUODENOSCOPY (EGD) WITH PROPOFOL  N/A 03/02/2016   Procedure: ESOPHAGOGASTRODUODENOSCOPY (EGD) WITH PROPOFOL ;  Surgeon: Lamar ONEIDA Holmes, MD;  Location: [facility] ENDOSCOPY;  Service: Endoscopy;  Laterality: N/A;   ESOPHAGOGASTRODUODENOSCOPY (EGD) WITH PROPOFOL  N/A 04/14/2019   Procedure: ESOPHAGOGASTRODUODENOSCOPY (EGD) WITH PROPOFOL ;  Surgeon: Toledo, Ladell POUR, MD;  Location: [name] ENDOSCOPY;  Service: Gastroenterology;  Laterality: N/A;   ESOPHAGOGASTRODUODENOSCOPY (EGD) WITH PROPOFOL  N/A 12/07/2020   Procedure: ESOPHAGOGASTRODUODENOSCOPY (EGD) WITH PROPOFOL ;  Surgeon: Maryruth Ole ONEIDA, MD;  Location: [name] ENDOSCOPY;  Service: Endoscopy;  Laterality: N/A;   LYMPH NODE BIOPSY Right 07/24/2011   Dr Cota   MOHS SURGERY     bilateral shoulders   MOHS SURGERY Right 02/27/2022   Squamous cell   PTCA     skin cancer removal     TOTAL ABDOMINAL HYSTERECTOMY W/ BILATERAL SALPINGOOPHORECTOMY      FAMILY HISTORY :   Family History  Problem Relation Age of Onset   Heart attack Father    Heart disease Brother    Leukemia Mother    Bladder Cancer Neg Hx  Prostate cancer Neg Hx    Kidney cancer Neg Hx    Breast cancer Neg Hx     SOCIAL HISTORY:   Social History   Tobacco Use   Smoking status: Former    Current packs/day: 0.00    Types: Cigarettes    Quit date: 1950    Years since quitting: 75.5   Smokeless tobacco: Never  Vaping Use   Vaping status: Never Used  Substance Use Topics   Alcohol use: No    Alcohol/week: 0.0 standard drinks of alcohol   Drug use: No    ALLERGIES:  is allergic to ace inhibitors, benadryl  [diphenhydramine  hcl], codeine, diphenhydramine , guaiacol, hydrocodone , cardizem  [diltiazem ], and guaifenesin & derivatives.  MEDICATIONS:  Current Outpatient Medications  Medication Sig Dispense Refill   acetaminophen   (TYLENOL ) 650 MG CR tablet Take 650 mg by mouth every 8 (eight) hours as needed for pain.     albuterol  (VENTOLIN  HFA) 108 (90 Base) MCG/ACT inhaler Inhale 2 puffs into the lungs every 6- 8 hours as needed for cough or shortness of breath 8 g 2   aspirin 81 MG tablet Take 81 mg by mouth every morning.      carvedilol  (COREG ) 6.25 MG tablet TAKE 1 TABLET BY MOUTH TWICE DAILY WITH A MEAL 180 tablet 3   Cholecalciferol (VITAMIN D-3) 5000 UNITS TABS Take 2,000 Int'l Units by mouth every morning.      cyanocobalamin (VITAMIN B12) 1000 MCG tablet Take 1,000 mcg by mouth.     DULoxetine  (CYMBALTA ) 30 MG capsule Take 1 capsule (30 mg total) by mouth daily. 30 capsule 1   fexofenadine (ALLEGRA) 180 MG tablet Take 180 mg by mouth daily as needed for rhinitis.     ibuprofen (ADVIL) 800 MG tablet Take 800 mg by mouth every 6 (six) hours as needed.     latanoprost (XALATAN) 0.005 % ophthalmic solution Place 1 drop into both eyes at bedtime.     LORazepam  (ATIVAN ) 0.5 MG tablet Take 0.5 mg by mouth every 8 (eight) hours as needed for anxiety.     losartan  (COZAAR ) 25 MG tablet TAKE 1 TABLET BY MOUTH DAILY (Patient taking differently: Take 12.5 mg by mouth daily.) 90 tablet 3   mirtazapine (REMERON) 15 MG tablet Take 1 tablet by mouth at bedtime.     pantoprazole  (PROTONIX ) 40 MG tablet TAKE ONE (1) TABLET BY MOUTH TWO TIMES PER DAY 180 tablet 0   rosuvastatin  (CRESTOR ) 10 MG tablet TAKE 1 TABLET BY MOUTH EVERY OTHER DAY AS DIRECTED. (Patient taking differently: Take 10 mg by mouth. TAKE 1 TABLET BY MOUTH MONDAY, WEDNESDAY AND FRIDAY) 45 tablet 3   timolol (TIMOPTIC) 0.5 % ophthalmic solution Place 1 drop into both eyes 2 (two) times daily.     predniSONE  (DELTASONE ) 20 MG tablet 2 pills a day x 1 week; and then 1 pill a day x 1 week; and the 1/2 a pill x 2 week; and then STOP- Take in AM- (Patient not taking: Reported on 03/04/2024) 30 tablet 0   traMADol (ULTRAM) 50 MG tablet Take by mouth as needed. (Patient not  taking: Reported on 03/04/2024)     No current facility-administered medications for this visit.    PHYSICAL EXAMINATION: ECOG PERFORMANCE STATUS: 0 - Asymptomatic  BP (!) 123/56 (BP Location: Left Arm, Patient Position: Sitting, Cuff Size: Small)   Pulse 75   Temp (!) 97 F (36.1 C) (Tympanic)   Resp (!) 24   Ht 5' 2 (1.575 m)  Wt 96 lb 14.4 oz (44 kg)   SpO2 99%   BMI 17.72 kg/m   Filed Weights   03/04/24 0818  Weight: 96 lb 14.4 oz (44 kg)   No lymphadenopathy noted.  Physical Exam HENT:     Head: Normocephalic and atraumatic.     Mouth/Throat:     Pharynx: No oropharyngeal exudate.  Eyes:     Pupils: Pupils are equal, round, and reactive to light.  Cardiovascular:     Rate and Rhythm: Normal rate and regular rhythm.  Pulmonary:     Effort: No respiratory distress.     Breath sounds: No wheezing.  Abdominal:     General: Bowel sounds are normal. There is no distension.     Palpations: Abdomen is soft. There is no mass.     Tenderness: There is no abdominal tenderness. There is no guarding or rebound.  Musculoskeletal:        General: No tenderness. Normal range of motion.     Cervical back: Normal range of motion and neck supple.  Skin:    General: Skin is warm.  Neurological:     Mental Status: She is alert and oriented to person, place, and time.  Psychiatric:        Mood and Affect: Affect normal.      LABORATORY DATA:  I have reviewed the data as listed    Component Value Date/Time   NA 137 03/04/2024 0818   NA 141 09/07/2014 1449   K 4.1 03/04/2024 0818   K 4.0 09/07/2014 1449   CL 105 03/04/2024 0818   CL 106 09/07/2014 1449   CO2 25 03/04/2024 0818   CO2 29 09/07/2014 1449   GLUCOSE 128 (H) 03/04/2024 0818   GLUCOSE 109 (H) 09/07/2014 1449   BUN 25 (H) 03/04/2024 0818   BUN 19 (H) 09/07/2014 1449   CREATININE 1.06 (H) 03/04/2024 0818   CREATININE 1.02 (H) 01/31/2024 0813   CREATININE 1.01 09/07/2014 1449   CALCIUM  9.1 03/04/2024 0818    CALCIUM  8.8 09/07/2014 1449   PROT 7.1 12/14/2023 0927   PROT 6.3 09/02/2015 0824   PROT 6.5 09/07/2014 1449   ALBUMIN 3.8 12/14/2023 0927   ALBUMIN 4.2 09/02/2015 0824   ALBUMIN 3.8 09/07/2014 1449   AST 15 12/14/2023 0927   ALT 11 12/14/2023 0927   ALT 22 09/07/2014 1449   ALKPHOS 69 12/14/2023 0927   ALKPHOS 75 09/07/2014 1449   BILITOT 0.7 12/14/2023 0927   GFRNONAA 51 (L) 03/04/2024 0818   GFRNONAA 54 (L) 01/31/2024 0813   GFRNONAA 56 (L) 09/07/2014 1449   GFRNONAA 44 (L) 02/09/2014 1351   GFRAA 59 (L) 02/27/2020 1023   GFRAA >60 09/07/2014 1449   GFRAA 51 (L) 02/09/2014 1351    No results found for: SPEP, UPEP  Lab Results  Component Value Date   WBC 6.3 03/04/2024   NEUTROABS 3.2 03/04/2024   HGB 11.6 (L) 03/04/2024   HCT 35.3 (L) 03/04/2024   MCV 95.7 03/04/2024   PLT 191 03/04/2024      Chemistry      Component Value Date/Time   NA 137 03/04/2024 0818   NA 141 09/07/2014 1449   K 4.1 03/04/2024 0818   K 4.0 09/07/2014 1449   CL 105 03/04/2024 0818   CL 106 09/07/2014 1449   CO2 25 03/04/2024 0818   CO2 29 09/07/2014 1449   BUN 25 (H) 03/04/2024 0818   BUN 19 (H) 09/07/2014 1449   CREATININE 1.06 (  H) 03/04/2024 0818   CREATININE 1.02 (H) 01/31/2024 0813   CREATININE 1.01 09/07/2014 1449      Component Value Date/Time   CALCIUM  9.1 03/04/2024 0818   CALCIUM  8.8 09/07/2014 1449   ALKPHOS 69 12/14/2023 0927   ALKPHOS 75 09/07/2014 1449   AST 15 12/14/2023 0927   ALT 11 12/14/2023 0927   ALT 22 09/07/2014 1449   BILITOT 0.7 12/14/2023 0927      RADIOGRAPHIC STUDIES: I have personally reviewed the radiological images as listed and agreed with the findings in the report. No results found.   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 Status post multiple lines of therapy in the past; October 2022 finished -s/p  Rituxan  weely x4.  Jan 11, 2023-  Progressive bilateral axillary and subpectoral lymphadenopathy (Deauville 4);  Small bilateral cervical lymph nodes with low level hypermetabolism ; Enlarging retroperitoneal and pelvic lymph nodes (Deauville 4); Right inguinal lymph nodes with low level hypermetabolism.  No findings for osseous lymphoma-  s/p rituximab  weekly infusion x 4 [finished JUNE 2024.] 10/04-PET scan- complete response.    # Clinically stable. Will order PET scan- today  # Mild anemia- prior- I sat- 13- #Recommend gentle iron [iron biglycinate; 28 mg ] 1 pill a day.  This pill is unlikely to cause stomach upset or cause constipation. check iron studies;ferritin  # Severe immunoglobulin deficiency- [status post rituximab ]- Acute/chronic bronchitis--high risk of infections-s/p IVIG infusion [400 mg/kg] monthly . MAY 2025-  infusion IVIG monthly x3- proceed with #3 /#3 of planned IVIG today-.   # PET scan- May 2024- Small bilateral pulmonary nodules with mild hypermetabolism- OCT 2024- PET- no worsening lung nodules. Awaiting repeat PET scan after acute issues resolve.  Await PET scan.  # Fatigue: ? Lymphoma vs other- cardiac [Dr.Arida; Feb 2024]-status post re-evaluation with cardiology;  March 08, 2023 2D echo- Stable.   # Weight loss: UNlikley from Lymphoma [given improvement of PET scan AUG 2023].s/p Nutrition; Joli-Stable.   # Post herpetic neurlagia/Itch- currently  pramoxine topical-Stable.   # Vaccinations: Ok with flu shot [nov 24] covid/RSV.   # DISPOSITION:  # IVIG infusion today # in 2 month- MD; labs- cbc/CMP: LDH-  quant immunoglobulin PET scan; iron studies; ferritin; LDH-  Dr.B    Orders Placed This Encounter  Procedures   NM PET Image Restage (PS) Skull Base to Thigh (F-18 FDG)    Standing Status:   Future    Expected Date:   05/05/2024    Expiration Date:   03/04/2025    If indicated for the ordered procedure, I authorize the administration of a radiopharmaceutical  per Radiology protocol:   Yes    Preferred imaging location?:   [facility]   All questions were answered. The patient knows to call the clinic with any problems, questions or concerns.      Cindy JONELLE Joe, MD 03/04/2024 9:23 AM

## 2024-03-04 NOTE — Addendum Note (Signed)
 Addended by: JASMINE DELON POUR on: 03/04/2024 09:35 AM   Modules accepted: Orders

## 2024-03-04 NOTE — Assessment & Plan Note (Addendum)
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 Status post multiple lines of therapy in the past; October 2022 finished -s/p Rituxan  weely x4.  Jan 11, 2023-  Progressive bilateral axillary and subpectoral lymphadenopathy (Deauville 4);  Small bilateral cervical lymph nodes with low level hypermetabolism ; Enlarging retroperitoneal and pelvic lymph nodes (Deauville 4); Right inguinal lymph nodes with low level hypermetabolism.  No findings for osseous lymphoma-  s/p rituximab  weekly infusion x 4 [finished JUNE 2024.] 10/04-PET scan- complete response.    # Clinically stable. Will order PET scan- today  # Mild anemia- prior- I sat- 13- #Recommend gentle iron [iron biglycinate; 28 mg ] 1 pill a day.  This pill is unlikely to cause stomach upset or cause constipation. check iron studies;ferritin  # Severe immunoglobulin deficiency- [status post rituximab ]- Acute/chronic bronchitis--high risk of infections-s/p IVIG infusion [400 mg/kg] monthly . MAY 2025-  infusion IVIG monthly x3- proceed with #3 /#3 of planned IVIG today-.   # PET scan- May 2024- Small bilateral pulmonary nodules with mild hypermetabolism- OCT 2024- PET- no worsening lung nodules. Awaiting repeat PET scan after acute issues resolve.  Await PET scan.  # Fatigue: ? Lymphoma vs other- cardiac [Dr.Arida; Feb 2024]-status post re-evaluation with cardiology;  March 08, 2023 2D echo- Stable.   # Weight loss: UNlikley from Lymphoma [given improvement of PET scan AUG 2023].s/p Nutrition; Joli-Stable.   # Post herpetic neurlagia/Itch- currently  pramoxine topical-Stable.   # Vaccinations: Ok with flu shot [nov 24] covid/RSV.   # DISPOSITION:  # IVIG infusion today # in 2 month- MD; labs- cbc/CMP: LDH-  quant immunoglobulin PET scan; iron studies; ferritin; LDH-  Dr.B

## 2024-03-05 ENCOUNTER — Other Ambulatory Visit: Payer: Self-pay

## 2024-03-06 ENCOUNTER — Telehealth: Payer: Self-pay | Admitting: *Deleted

## 2024-03-06 NOTE — Telephone Encounter (Signed)
 Cathlyn called and said last week on Tuesday he had to step out of the room and the patient was supposed to tell Dr. Rennie that there is knots on the legs.  He says that he would love to have an appointment next week either Tuesday Wednesday Thursday or Friday to look at this or if he thinks it needs dermatology we can do that to.  Please call back to [place] at [phone]

## 2024-03-11 ENCOUNTER — Encounter: Admitting: Hospice and Palliative Medicine

## 2024-03-12 ENCOUNTER — Inpatient Hospital Stay (HOSPITAL_BASED_OUTPATIENT_CLINIC_OR_DEPARTMENT_OTHER): Admitting: Hospice and Palliative Medicine

## 2024-03-12 ENCOUNTER — Encounter: Payer: Self-pay | Admitting: Hospice and Palliative Medicine

## 2024-03-12 VITALS — BP 125/50 | HR 62 | Temp 96.8°F | Resp 18 | Wt 96.6 lb

## 2024-03-12 DIAGNOSIS — L989 Disorder of the skin and subcutaneous tissue, unspecified: Secondary | ICD-10-CM | POA: Diagnosis not present

## 2024-03-12 DIAGNOSIS — C8308 Small cell B-cell lymphoma, lymph nodes of multiple sites: Secondary | ICD-10-CM | POA: Diagnosis not present

## 2024-03-12 MED ORDER — CEPHALEXIN 500 MG PO CAPS: 500.0000 mg | ORAL_CAPSULE | Freq: Three times a day (TID) | ORAL | 0 refills | Status: DC

## 2024-03-12 NOTE — Progress Notes (Signed)
 d  [facility] at [facility] [phone] [phone]  Patient Care Team: Auston Reyes BIRCH, MD as PCP - General (Unknown Physician Specialty) Darron Deatrice LABOR, MD as PCP - Cardiology (Cardiology) Byrnett, Reyes ORN, MD (General Surgery) Wilder Pink, MD (Oncology) Darron Deatrice LABOR, MD as Consulting Physician (Cardiology) Rennie Cindy SAUNDERS, MD as Consulting Physician (Oncology)   NAME OF PATIENT: Jeanne Pittman  [id number]  20-Jul-1935   DATE OF VISIT: 03/12/24  REASON FOR CONSULT: Jeanne Pittman is a 89 y.o. female with multiple medical problems including stage IV follicular marginal zone lymphoma status post multiple lines of previous treatment.   INTERVAL HISTORY: Patient presents [facility] today for evaluation of bilateral lower extremity skin lesions.  Patient states that she has had knots for the past 3 to 4 weeks in various stages of healing.  She has noticed them on her knees, legs, and feet.  This started as boils but then will harden and itch and cause pain.  She denies any drainage or discharge.  No changes in medications or topical creams or lotions.  No history of rashes.  Patient is not currently using any skin treatments.  Denies any neurologic complaints. Denies recent fevers or illnesses. Denies any easy bleeding or bruising. Reports good appetite and denies weight loss. Denies chest pain. Denies any nausea, vomiting, constipation, or diarrhea. Denies urinary complaints. Patient offers no further specific complaints today.   PAST MEDICAL HISTORY: Past Medical History:  Diagnosis Date   A-fib (HCC)    07/2012: A. fib with RVR in the setting of myocardial infarction. Converted to sinus rhythm with amiodarone . No recurrence.   Anxiety    Atrophic vaginitis    Chronic combined systolic (congestive) and diastolic (congestive) heart failure (HCC)    a. EF was 25-35% post MI but improved to 45-50% in  04/2013; b. 03/2017 Echo: EF 40-45%, mod focal/basal hypertrophy of septum. Sev mid-apicalanteroseptal, ant, and apical HK. Gr1 DD, mild AI/MR, mod TR, PASP .   Colon adenomas    Coronary artery disease    a. 07/2012 STEMI complicated by cardiogenic shock. Cath/PCI: LAD 129m (DESx2), RCA 95p(DES). EF 25%; b. 03/2017 MV: EF 57%, fixed apical, periapical, mid to distal anteroseptal defect. No ischemia.   Eczema    Fibrocystic breast disease    Gastritis    GERD (gastroesophageal reflux disease)    Glaucoma    Headache    Hyperlipidemia    Hypertension    Hypothyroidism    Insomnia    Ischemic cardiomyopathy    a. 07/2012 EF 25-35% following MI-->Improved to 45-50%;  b. 03/2017 Echo: EF 40-45%.   Myocardial infarction (HCC) 08/18/2014   Non Hodgkin's lymphoma (HCC) 2005   reoccurance 2007 and 2012   Osteoarthritis    Osteoporosis    Polyneuropathy    Polyposis of colon    Restless leg syndrome    Shingles    right   Urinary, incontinence, stress female     PAST SURGICAL HISTORY:  Past Surgical History:  Procedure Laterality Date   ABDOMINAL HYSTERECTOMY  1956   APPENDECTOMY     AXILLARY LYMPH NODE BIOPSY Left 03/18/2015   Procedure: AXILLARY LYMPH NODE BIOPSY;  Surgeon: Reyes ORN Cota, MD;  Location: [name];  Service: General;  Laterality: Left;   CARDIAC CATHETERIZATION  08/19/2012   [name]; [facility]   COLONOSCOPY     COLONOSCOPY WITH PROPOFOL  N/A 03/02/2016   Procedure: COLONOSCOPY WITH PROPOFOL ;  Surgeon: Lamar DASEN  Viktoria, MD;  Location: [name] ENDOSCOPY;  Service: Endoscopy;  Laterality: N/A;   COLONOSCOPY WITH PROPOFOL  N/A 09/26/2017   Procedure: COLONOSCOPY WITH PROPOFOL ;  Surgeon: Viktoria Lamar DASEN, MD;  [facility] END[facility]COPY;  Service: Endoscopy;  Laterality: N/A;   COLONOSCOPY WITH PROPOFOL  N/A 03/11/2020   Procedure: COLONOSCOPY WITH PROPOFOL ;  Surgeon: Tye Millet, DO;  Location: [name] ENDOSCOPY;  Service: General;  Laterality: N/A;   CORONARY ANGIOPLASTY WITH  STENT PLACEMENT     x3 stents   CORONARY ANGIOPLASTY WITH STENT PLACEMENT     CORONARY ARTERY BYPASS GRAFT     ESOPHAGOGASTRODUODENOSCOPY (EGD) WITH PROPOFOL  N/A 03/02/2016   Procedure: ESOPHAGOGASTRODUODENOSCOPY (EGD) WITH PROPOFOL ;  Surgeon: Lamar DASEN Viktoria, MD;  Location: [facility] ENDOSCOPY;  Service: Endoscopy;  Laterality: N/A;   ESOPHAGOGASTRODUODENOSCOPY (EGD) WITH PROPOFOL  N/A 04/14/2019   Procedure: ESOPHAGOGASTRODUODENOSCOPY (EGD) WITH PROPOFOL ;  Surgeon: Toledo, Ladell POUR, MD;  Location: [name] ENDOSCOPY;  Service: Gastroenterology;  Laterality: N/A;   ESOPHAGOGASTRODUODENOSCOPY (EGD) WITH PROPOFOL  N/A 12/07/2020   Procedure: ESOPHAGOGASTRODUODENOSCOPY (EGD) WITH PROPOFOL ;  Surgeon: Maryruth Ole DASEN, MD;  Location: [name] ENDOSCOPY;  Service: Endoscopy;  Laterality: N/A;   LYMPH NODE BIOPSY Right 07/24/2011   Dr Dessa   MOHS SURGERY     bilateral shoulders   MOHS SURGERY Right 02/27/2022   Squamous cell   PTCA     skin cancer removal     TOTAL ABDOMINAL HYSTERECTOMY W/ BILATERAL SALPINGOOPHORECTOMY      HEMATOLOGY/ONCOLOGY HISTORY:  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; Status post chemotherapy [R-CVP] and maintenance Rituxan  therapy; zavelin therapy February of 2010  # 2012- .biopsy from the right axillary lymph node (November, 2012) biopsy suggest marginal   zone B cell lymphoma;  Rituxan  once a week started in January of 2013  # July 2016-RECURRENCE- LYMPH NODE, LEFT AXILLA; EXCISIONAL BIOPSY:  - LOW-GRADE B-CELL LYMPHOMA, IMMUNOPHENOTYPICALLY MOST CONSISTENT WITH CLL/SLL, PET April 2018- STABLE/slight progression   # July 2022-progression of lymphoma on imaging.  #May 24, 2021-rituximab  weekly x4; FEB 2023- PET significant partial response.  #MAY 2024- PET progression- June 24th, 2024-  cycle #1 of rituximab  weekly infusion x 4.  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  MEDICATIONS:  Current Outpatient Medications  Medication Sig Dispense Refill   acetaminophen  (TYLENOL ) 650 MG CR tablet Take 650 mg by mouth every 8 (eight) hours as needed for pain.     albuterol  (VENTOLIN  HFA) 108 (90 Base) MCG/ACT inhaler Inhale 2 puffs into the lungs every 6- 8 hours as needed for cough or shortness of breath  8 g 2   aspirin 81 MG tablet Take 81 mg by mouth every morning.      carvedilol  (COREG ) 6.25 MG tablet TAKE 1 TABLET BY MOUTH TWICE DAILY WITH A MEAL 180 tablet 3   Cholecalciferol (VITAMIN D-3) 5000 UNITS TABS Take 2,000 Int'l Units by mouth every morning.      cyanocobalamin (VITAMIN B12) 1000 MCG tablet Take 1,000 mcg by mouth.     DULoxetine  (CYMBALTA ) 30 MG capsule Take 1 capsule (30 mg total) by mouth daily. 30 capsule 1   fexofenadine (ALLEGRA) 180 MG tablet Take 180 mg by mouth daily as needed for rhinitis.     ibuprofen (ADVIL) 800 MG tablet Take 800 mg by mouth every 6 (six) hours as needed.     latanoprost (XALATAN) 0.005 % ophthalmic solution Place 1 drop into both eyes at bedtime.      LORazepam  (ATIVAN ) 0.5 MG tablet Take 0.5 mg by mouth every 8 (eight) hours as needed for anxiety.     losartan  (COZAAR ) 25 MG tablet TAKE 1 TABLET BY MOUTH DAILY (Patient taking differently: Take 12.5 mg by mouth daily.) 90 tablet 3   mirtazapine (REMERON) 15 MG tablet Take 1 tablet by mouth at bedtime.     pantoprazole  (PROTONIX ) 40 MG tablet TAKE ONE (1) TABLET BY MOUTH TWO TIMES PER DAY 180 tablet 0   predniSONE  (DELTASONE ) 20 MG tablet 2 pills a day x 1 week; and then 1 pill a day x 1 week; and the 1/2 a pill x 2 week; and then STOP- Take in AM- (Patient not taking: Reported on 03/04/2024) 30 tablet 0   rosuvastatin  (CRESTOR ) 10 MG tablet TAKE 1 TABLET BY MOUTH EVERY OTHER DAY AS DIRECTED. (Patient taking differently: Take 10 mg by mouth. TAKE 1 TABLET BY MOUTH MONDAY, WEDNESDAY AND FRIDAY) 45 tablet 3   timolol (TIMOPTIC) 0.5 % ophthalmic solution Place 1 drop into both eyes 2 (two) times daily.     traMADol (ULTRAM) 50 MG tablet Take by mouth as needed. (Patient not taking: Reported on 03/04/2024)     No current facility-administered medications for this visit.    VITAL SIGNS: There were no vitals taken for this visit. There were no vitals filed for this visit.  Estimated body mass index is 17.72 kg/m as calculated from the following:   Height as of 03/04/24: 5' 2 (1.575 m).   Weight as of 03/04/24: 96 lb 14.4 oz (44 kg).  LABS: CBC:    Component Value Date/Time   WBC 6.3 03/04/2024 0818   WBC 7.3 01/16/2023 1304   HGB 11.6 (L) 03/04/2024 0818   HGB 13.6 09/07/2014 1449   HCT 35.3 (L) 03/04/2024 0818   HCT 40.0 09/07/2014 1449   PLT 191 03/04/2024 0818   PLT 147 (L) 09/07/2014 1449   MCV 95.7 03/04/2024 0818   MCV 95 09/07/2014 1449   NEUTROABS 3.2 03/04/2024 0818   NEUTROABS 2.9 09/07/2014 1449   LYMPHSABS 2.5 03/04/2024 0818   LYMPHSABS 2.5 09/07/2014 1449   MONOABS 0.3 03/04/2024 0818   MONOABS 0.4 09/07/2014 1449   EOSABS 0.1 03/04/2024 0818   EOSABS 0.2 09/07/2014  1449   BASOSABS 0.1 03/04/2024 0818   BASOSABS 0.1 09/07/2014 1449   Comprehensive Metabolic Panel:    Component Value Date/Time   NA 137 03/04/2024 0818   NA 141 09/07/2014 1449   K 4.1 03/04/2024 0818   K 4.0 09/07/2014 1449   CL 105 03/04/2024 0818   CL 106 09/07/2014  1449   CO2 25 03/04/2024 0818   CO2 29 09/07/2014 1449   BUN 25 (H) 03/04/2024 0818   BUN 19 (H) 09/07/2014 1449   CREATININE 1.06 (H) 03/04/2024 0818   CREATININE 1.02 (H) 01/31/2024 0813   CREATININE 1.01 09/07/2014 1449   GLUCOSE 128 (H) 03/04/2024 0818   GLUCOSE 109 (H) 09/07/2014 1449   CALCIUM  9.1 03/04/2024 0818   CALCIUM  8.8 09/07/2014 1449   AST 15 12/14/2023 0927   ALT 11 12/14/2023 0927   ALT 22 09/07/2014 1449   ALKPHOS 69 12/14/2023 0927   ALKPHOS 75 09/07/2014 1449   BILITOT 0.7 12/14/2023 0927   PROT 7.1 12/14/2023 0927   PROT 6.3 09/02/2015 0824   PROT 6.5 09/07/2014 1449   ALBUMIN 3.8 12/14/2023 0927   ALBUMIN 4.2 09/02/2015 0824   ALBUMIN 3.8 09/07/2014 1449    RADIOGRAPHIC STUDIES: No results found.  PERFORMANCE STATUS (ECOG) : 1 - Symptomatic but completely ambulatory  Review of Systems Unless otherwise noted, a complete review of systems is negative.  Physical Exam General: NAD Cardiovascular: regular rate and rhythm Pulmonary: clear ant fields Abdomen: soft, nontender, + bowel sounds GU: no suprapubic tenderness Extremities: no edema, no joint deformities Skin: Multiple small, hard, circular soft tissue lesions to bilateral legs Neurological: Weakness but otherwise nonfocal           IMPRESSION/PLAN: Follicular lymphoma/marginal zone lymphoma/SLL -patient status post weekly rituximab  x 4 (finished in June 2024) with PET showing complete response.  Patient is pending repeat PET scan in September.  Skin lesions -unclear etiology.  One of the lesions on her right leg has surrounding erythema.  Discussed with Dr. Rennie who also saw the patient.  Will start  patient on 10 days of cephalexin  and refer to dermatology.  Patient is established with [facility].  Patient son says he will call dermatologist.  Can follow-up with [facility] if patient is not able to be seen.  Patient expressed understanding and was in agreement with this plan. She also understands that She can call clinic at any time with any questions, concerns, or complaints.   Thank you for allowing me to participate in the care of this very pleasant patient.   Time Total: 15 minutes  Visit consisted of counseling and education dealing with the complex and emotionally intense issues of symptom management in the setting of serious illness.Greater than 50%  of this time was spent counseling and coordinating care related to the above assessment and plan.  Signed by: Fonda Mower, PhD, NP-C

## 2024-03-24 ENCOUNTER — Ambulatory Visit: Admitting: Dermatology

## 2024-04-16 ENCOUNTER — Other Ambulatory Visit: Payer: Self-pay

## 2024-04-29 ENCOUNTER — Ambulatory Visit
Admission: RE | Admit: 2024-04-29 | Discharge: 2024-04-29 | Disposition: A | Discharge status: Home / Self Care | Source: Ambulatory Visit | Attending: Internal Medicine | Admitting: Internal Medicine

## 2024-04-29 DIAGNOSIS — R918 Other nonspecific abnormal finding of lung field: Secondary | ICD-10-CM | POA: Diagnosis not present

## 2024-04-29 DIAGNOSIS — I700 Atherosclerosis of aorta: Secondary | ICD-10-CM | POA: Diagnosis not present

## 2024-04-29 DIAGNOSIS — E119 Type 2 diabetes mellitus without complications: Secondary | ICD-10-CM | POA: Insufficient documentation

## 2024-04-29 DIAGNOSIS — C8304 Small cell B-cell lymphoma, lymph nodes of axilla and upper limb: Secondary | ICD-10-CM | POA: Insufficient documentation

## 2024-04-29 DIAGNOSIS — R5383 Other fatigue: Secondary | ICD-10-CM | POA: Diagnosis not present

## 2024-04-29 LAB — GLUCOSE, CAPILLARY: Glucose-Capillary: 69 mg/dL — ABNORMAL LOW (ref 70–99)

## 2024-04-29 MED ORDER — FLUDEOXYGLUCOSE F - 18 (FDG) INJECTION
5.2000 | Freq: Once | INTRAVENOUS | Status: AC | PRN
Start: 2024-04-29 — End: 2024-04-29
  Administered 2024-04-29: 5.22 via INTRAVENOUS

## 2024-05-01 ENCOUNTER — Other Ambulatory Visit: Payer: Self-pay

## 2024-05-05 ENCOUNTER — Other Ambulatory Visit

## 2024-05-05 ENCOUNTER — Inpatient Hospital Stay (HOSPITAL_BASED_OUTPATIENT_CLINIC_OR_DEPARTMENT_OTHER): Admitting: Internal Medicine

## 2024-05-05 ENCOUNTER — Inpatient Hospital Stay: Attending: Internal Medicine

## 2024-05-05 ENCOUNTER — Ambulatory Visit: Admitting: Internal Medicine

## 2024-05-05 ENCOUNTER — Encounter: Payer: Self-pay | Admitting: Internal Medicine

## 2024-05-05 VITALS — BP 155/59 | HR 61 | Temp 97.1°F | Resp 18 | Ht 62.0 in | Wt 95.1 lb

## 2024-05-05 DIAGNOSIS — Z9221 Personal history of antineoplastic chemotherapy: Secondary | ICD-10-CM | POA: Insufficient documentation

## 2024-05-05 DIAGNOSIS — D649 Anemia, unspecified: Secondary | ICD-10-CM | POA: Insufficient documentation

## 2024-05-05 DIAGNOSIS — C8308 Small cell B-cell lymphoma, lymph nodes of multiple sites: Secondary | ICD-10-CM | POA: Insufficient documentation

## 2024-05-05 DIAGNOSIS — M810 Age-related osteoporosis without current pathological fracture: Secondary | ICD-10-CM | POA: Diagnosis not present

## 2024-05-05 DIAGNOSIS — C8304 Small cell B-cell lymphoma, lymph nodes of axilla and upper limb: Secondary | ICD-10-CM

## 2024-05-05 DIAGNOSIS — Z87891 Personal history of nicotine dependence: Secondary | ICD-10-CM | POA: Diagnosis not present

## 2024-05-05 LAB — CBC WITH DIFFERENTIAL (CANCER CENTER ONLY)
Abs Immature Granulocytes: 0.02 K/uL (ref 0.00–0.07)
Basophils Absolute: 0 K/uL (ref 0.0–0.1)
Basophils Relative: 1 %
Eosinophils Absolute: 0.1 K/uL (ref 0.0–0.5)
Eosinophils Relative: 1 %
HCT: 34.8 % — ABNORMAL LOW (ref 36.0–46.0)
Hemoglobin: 11.7 g/dL — ABNORMAL LOW (ref 12.0–15.0)
Immature Granulocytes: 0 %
Lymphocytes Relative: 50 %
Lymphs Abs: 2.5 K/uL (ref 0.7–4.0)
MCH: 31 pg (ref 26.0–34.0)
MCHC: 33.6 g/dL (ref 30.0–36.0)
MCV: 92.3 fL (ref 80.0–100.0)
Monocytes Absolute: 0.3 K/uL (ref 0.1–1.0)
Monocytes Relative: 6 %
Neutro Abs: 2.1 K/uL (ref 1.7–7.7)
Neutrophils Relative %: 42 %
Platelet Count: 131 K/uL — ABNORMAL LOW (ref 150–400)
RBC: 3.77 MIL/uL — ABNORMAL LOW (ref 3.87–5.11)
RDW: 13.6 % (ref 11.5–15.5)
WBC Count: 5.1 K/uL (ref 4.0–10.5)
nRBC: 0 % (ref 0.0–0.2)

## 2024-05-05 LAB — CMP (CANCER CENTER ONLY)
ALT: 8 U/L (ref 0–44)
AST: 16 U/L (ref 15–41)
Albumin: 3.7 g/dL (ref 3.5–5.0)
Alkaline Phosphatase: 62 U/L (ref 38–126)
Anion gap: 8 (ref 5–15)
BUN: 20 mg/dL (ref 8–23)
CO2: 26 mmol/L (ref 22–32)
Calcium: 8.8 mg/dL — ABNORMAL LOW (ref 8.9–10.3)
Chloride: 101 mmol/L (ref 98–111)
Creatinine: 0.89 mg/dL (ref 0.44–1.00)
GFR, Estimated: >60 mL/min (ref >60)
Glucose, Bld: 87 mg/dL (ref 70–99)
Potassium: 4.3 mmol/L (ref 3.5–5.1)
Sodium: 135 mmol/L (ref 135–145)
Total Bilirubin: 0.7 mg/dL (ref 0.0–1.2)
Total Protein: 6.3 g/dL — ABNORMAL LOW (ref 6.5–8.1)

## 2024-05-05 LAB — IRON AND TIBC
Iron: 124 ug/dL (ref 28–170)
Saturation Ratios: 33 % — ABNORMAL HIGH (ref 10.4–31.8)
TIBC: 377 ug/dL (ref 250–450)
UIBC: 253 ug/dL

## 2024-05-05 LAB — FERRITIN: Ferritin: 16 ng/mL (ref 11–307)

## 2024-05-05 LAB — LACTATE DEHYDROGENASE: LDH: 105 U/L (ref 98–192)

## 2024-05-05 NOTE — Assessment & Plan Note (Addendum)
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 Status post multiple lines of therapy in the past; October 2022 finished -s/p Rituxan  weely x4.  Jan 11, 2023-  Progressive bilateral axillary and subpectoral lymphadenopathy (Deauville 4);  Small bilateral cervical lymph nodes with low level hypermetabolism ; Enlarging retroperitoneal and pelvic lymph nodes (Deauville 4); Right inguinal lymph nodes with low level hypermetabolism.  No findings for osseous lymphoma-  s/p rituximab  weekly infusion x 4 [finished JUNE 2024.] 10/04/ 2024--PET scan- complete response.    #  04/29/2024- PET scan: Small hypermetabolic pulmonary nodules (or intrapulmonary lymph nodes) and small pleural lesion in the chest as discussed above. Findings are indeterminate but somewhat suspicious for recurrent lymphoma. Recommend follow-up PET-CT in 6 months or so to reassess.No enlarged or hypermetabolic lymph nodes in the neck, abdomen or pelvis;  No findings for osseous lymphoma.continue surveillance. Stable.   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 MAY 2025-  infusion IVIG monthly x3- currently-  awaiting levels-if significantly low would recommend IVIG.  # Fatigue: ? Lymphoma vs other- cardiac [Dr.Arida; Feb 2024]-status post re-evaluation with cardiology;  March 08, 2023 2D echo- Stable.   # Weight loss: UNlikley from Lymphoma [given improvement of PET scan AUG 2023].s/p Nutrition; Joli-Stable.   # Post herpetic neurlagia/Itch- currently pramoxine topical-Stable.   # Vaccinations: Ok with flu shot []  covid/RSV.   # DISPOSITION:  # in 4 month- MD; labs- cbc/CMP: LDH-  quant immunoglobulin; iron studies; ferritin; LDH-  Dr.B  # I reviewed the  blood work- with the patient in detail; also reviewed the imaging independently [as summarized above]; and with the patient in detail.

## 2024-05-05 NOTE — Progress Notes (Signed)
 2 SCC removed from left ankle this morning, Dr. Dela.  Dr. Auston gave her 2 B12 injections last week.  PET 04/29/24.

## 2024-05-05 NOTE — Progress Notes (Signed)
 [facility] OFFICE PROGRESS NOTE  Patient Care Team: Auston Reyes BIRCH, MD as PCP - General (Unknown Physician Specialty) Darron Deatrice LABOR, MD as PCP - Cardiology (Cardiology) Byrnett, Reyes ORN, MD (General Surgery) Wilder Pink, MD (Oncology) Darron Deatrice LABOR, MD as Consulting Physician (Cardiology) Rennie Cindy SAUNDERS, MD as Consulting Physician (Oncology)   Cancer Staging  No matching staging information was found for the patient.     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  #May 24, 2021-rituximab  weekly x4; FEB 2023- PET significant partial response.  #MAY 2024- PET progression- June 24th, 2024-  cycle #1 of rituximab  weekly infusion x 4.  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  Alone.   Jeanne Pittman 89 y.o.  female pleasant patient above history of recurrent low-grade lymphoma/B cell non-Hodgkin-s/p  rituximab  infusion weekly [2024] currently on is here to proceed with IVIG infusion for severe immunodeficiency- review the results of the PET scan.  Patient has not had any further episodes of bronchitis.  Cough resolved.  No further antibiotics.  Patient had a recent resection of the SCC removed from left ankle this morning, Dr. Dela.  No fever no chills.  Appetite fair.  No weight loss.   Also patient received with her PCP Dr. Auston gave her 2 B12 injections last week.  Review of Systems  Constitutional:  Positive for malaise/fatigue and weight loss. Negative for chills, diaphoresis and fever.  HENT:  Negative for nosebleeds and sore throat.   Eyes:  Negative for double vision.  Respiratory:  Negative for cough, hemoptysis, sputum production, shortness of breath and wheezing.   Cardiovascular:  Negative for chest pain, palpitations, orthopnea and leg swelling.  Gastrointestinal:  Negative for abdominal pain, blood in stool, constipation, diarrhea, heartburn, melena, nausea and vomiting.  Genitourinary:  Negative for dysuria, frequency and urgency.  Musculoskeletal:  Positive for back pain and joint pain.  Skin: Negative.  Negative for itching and rash.  Neurological:  Negative for dizziness, tingling, focal weakness, weakness and headaches.  Endo/Heme/Allergies:  Does not bruise/bleed easily.  Psychiatric/Behavioral:  Negative for depression. The patient is not nervous/anxious and does not have insomnia.     PAST  MEDICAL HISTORY :  Past Medical History:  Diagnosis Date   A-fib (HCC)    07/2012: A. fib with RVR in the setting of myocardial infarction. Converted to sinus rhythm with amiodarone . No  recurrence.   Anxiety    Atrophic vaginitis    Chronic combined systolic (congestive) and diastolic (congestive) heart failure (HCC)    a. EF was 25-35% post MI but improved to 45-50% in 04/2013; b. 03/2017 Echo: EF 40-45%, mod focal/basal hypertrophy of septum. Sev mid-apicalanteroseptal, ant, and apical HK. Gr1 DD, mild AI/MR, mod TR, PASP .   Colon adenomas    Coronary artery disease    a. 07/2012 STEMI complicated by cardiogenic shock. Cath/PCI: LAD 121m (DESx2), RCA 95p(DES). EF 25%; b. 03/2017 MV: EF 57%, fixed apical, periapical, mid to distal anteroseptal defect. No ischemia.   Eczema    Fibrocystic breast disease    Gastritis    GERD (gastroesophageal reflux disease)    Glaucoma    Headache    Hyperlipidemia    Hypertension    Hypothyroidism    Insomnia    Ischemic cardiomyopathy    a. 07/2012 EF 25-35% following MI-->Improved to 45-50%;  b. 03/2017 Echo: EF 40-45%.   Myocardial infarction (HCC) 08/18/2014   Non Hodgkin's lymphoma (HCC) 2005   reoccurance 2007 and 2012   Osteoarthritis    Osteoporosis    Polyneuropathy    Polyposis of colon    Restless leg syndrome    Shingles    right   Urinary, incontinence, stress female     PAST SURGICAL HISTORY :   Past Surgical History:  Procedure Laterality Date   ABDOMINAL HYSTERECTOMY  1956   APPENDECTOMY     AXILLARY LYMPH NODE BIOPSY Left 03/18/2015   Procedure: AXILLARY LYMPH NODE BIOPSY;  Surgeon: Reyes LELON Cota, MD;  Location: [name];  Service: General;  Laterality: Left;   CARDIAC CATHETERIZATION  08/19/2012   [name]; [facility]   COLONOSCOPY     COLONOSCOPY WITH PROPOFOL  N/A 03/02/2016   Procedure: COLONOSCOPY WITH PROPOFOL ;  Surgeon: Lamar ONEIDA Holmes, MD;  Location: [facility];  Service: Endoscopy;  Laterality: N/A;   COLONOSCOPY WITH PROPOFOL  N/A 09/26/2017   Procedure: COLONOSCOPY WITH PROPOFOL ;  Surgeon: Holmes Lamar ONEIDA, MD;  Location: [facility] ENDOSCOPY;  Service: Endoscopy;  Laterality: N/A;    COLONOSCOPY WITH PROPOFOL  N/A 03/11/2020   Procedure: COLONOSCOPY WITH PROPOFOL ;  Surgeon: Tye Millet, DO;  Location: [name] ENDOSCOPY;  Service: General;  Laterality: N/A;   CORONARY ANGIOPLASTY WITH STENT PLACEMENT     x3 stents   CORONARY ANGIOPLASTY WITH STENT PLACEMENT     CORONARY ARTERY BYPASS GRAFT     ESOPHAGOGASTRODUODENOSCOPY (EGD) WITH PROPOFOL  N/A 03/02/2016   Procedure: ESOPHAGOGASTRODUODENOSCOPY (EGD) WITH PROPOFOL ;  Surgeon: Lamar ONEIDA Holmes, MD;  Location: [facility] ENDOSCOPY;  Service: Endoscopy;  Laterality: N/A;   ESOPHAGOGASTRODUODENOSCOPY (EGD) WITH PROPOFOL  N/A 04/14/2019   Procedure: ESOPHAGOGASTRODUODENOSCOPY (EGD) WITH PROPOFOL ;  Surgeon: Toledo, Ladell POUR, MD;  Location: [name] ENDOSCOPY;  Service: Gastroenterology;  Laterality: N/A;   ESOPHAGOGASTRODUODENOSCOPY (EGD) WITH PROPOFOL  N/A 12/07/2020   Procedure: ESOPHAGOGASTRODUODENOSCOPY (EGD) WITH PROPOFOL ;  Surgeon: Maryruth Ole ONEIDA, MD;  Location: [name] ENDOSCOPY;  Service: Endoscopy;  Laterality: N/A;   LYMPH NODE BIOPSY Right 07/24/2011   Dr Cota   MOHS SURGERY     bilateral shoulders   MOHS SURGERY Right 02/27/2022   Squamous cell   PTCA     skin cancer removal     TOTAL ABDOMINAL HYSTERECTOMY W/ BILATERAL SALPINGOOPHORECTOMY  FAMILY HISTORY :   Family History  Problem Relation Age of Onset   Heart attack Father    Heart disease Brother    Leukemia Mother    Bladder Cancer Neg Hx    Prostate cancer Neg Hx    Kidney cancer Neg Hx    Breast cancer Neg Hx     SOCIAL HISTORY:   Social History   Tobacco Use   Smoking status: Former    Current packs/day: 0.00    Types: Cigarettes    Quit date: 1950    Years since quitting: 75.7   Smokeless tobacco: Never  Vaping Use   Vaping status: Never Used  Substance Use Topics   Alcohol use: No    Alcohol/week: 0.0 standard drinks of alcohol   Drug use: No    ALLERGIES:  is allergic to ace inhibitors, benadryl  [diphenhydramine  hcl], codeine,  diphenhydramine , guaiacol, hydrocodone , cardizem  [diltiazem ], and guaifenesin & derivatives.  MEDICATIONS:  Current Outpatient Medications  Medication Sig Dispense Refill   acetaminophen  (TYLENOL ) 650 MG CR tablet Take 650 mg by mouth every 8 (eight) hours as needed for pain.     aspirin 81 MG tablet Take 81 mg by mouth every morning.      carvedilol  (COREG ) 6.25 MG tablet TAKE 1 TABLET BY MOUTH TWICE DAILY WITH A MEAL 180 tablet 3   Cholecalciferol (VITAMIN D-3) 5000 UNITS TABS Take 2,000 Int'l Units by mouth every morning.      cyanocobalamin (VITAMIN B12) 1000 MCG tablet Take 1,000 mcg by mouth.     DULoxetine  (CYMBALTA ) 30 MG capsule Take 1 capsule (30 mg total) by mouth daily. 30 capsule 1   fexofenadine (ALLEGRA) 180 MG tablet Take 180 mg by mouth daily as needed for rhinitis.     ibuprofen (ADVIL) 800 MG tablet Take 800 mg by mouth every 6 (six) hours as needed.     latanoprost (XALATAN) 0.005 % ophthalmic solution Place 1 drop into both eyes at bedtime.     LORazepam  (ATIVAN ) 0.5 MG tablet Take 0.5 mg by mouth every 8 (eight) hours as needed for anxiety.     losartan  (COZAAR ) 25 MG tablet TAKE 1 TABLET BY MOUTH DAILY (Patient taking differently: Take 12.5 mg by mouth daily. Per son takes 1/2 of the 12.5 mg) 90 tablet 3   pantoprazole  (PROTONIX ) 40 MG tablet TAKE ONE (1) TABLET BY MOUTH TWO TIMES PER DAY 180 tablet 0   rosuvastatin  (CRESTOR ) 10 MG tablet TAKE 1 TABLET BY MOUTH EVERY OTHER DAY AS DIRECTED. (Patient taking differently: Take 10 mg by mouth. TAKE 1 TABLET BY MOUTH MONDAY, WEDNESDAY AND FRIDAY) 45 tablet 3   timolol (TIMOPTIC) 0.5 % ophthalmic solution Place 1 drop into both eyes 2 (two) times daily.     traMADol (ULTRAM) 50 MG tablet Take by mouth as needed.     No current facility-administered medications for this visit.    PHYSICAL EXAMINATION: ECOG PERFORMANCE STATUS: 0 - Asymptomatic  BP (!) 155/59 (BP Location: Left Arm, Patient Position: Sitting, Cuff Size:  Normal) Comment: pt advised bp elevated, keep check at home, contact pcp if con't to stay elevated  Pulse 61   Temp (!) 97.1 F (36.2 C) (Tympanic)   Resp 18   Ht 5' 2 (1.575 m)   Wt 95 lb 1.6 oz (43.1 kg)   SpO2 99%   BMI 17.39 kg/m   Filed Weights   05/05/24 1250  Weight: 95 lb 1.6 oz (43.1 kg)   No lymphadenopathy noted.  Physical Exam HENT:     Head: Normocephalic and atraumatic.     Mouth/Throat:     Pharynx: No oropharyngeal exudate.  Eyes:     Pupils: Pupils are equal, round, and reactive to light.  Cardiovascular:     Rate and Rhythm: Normal rate and regular rhythm.  Pulmonary:     Effort: No respiratory distress.     Breath sounds: No wheezing.  Abdominal:     General: Bowel sounds are normal. There is no distension.     Palpations: Abdomen is soft. There is no mass.     Tenderness: There is no abdominal tenderness. There is no guarding or rebound.  Musculoskeletal:        General: No tenderness. Normal range of motion.     Cervical back: Normal range of motion and neck supple.  Skin:    General: Skin is warm.  Neurological:     Mental Status: She is alert and oriented to person, place, and time.  Psychiatric:        Mood and Affect: Affect normal.      LABORATORY DATA:  I have reviewed the data as listed    Component Value Date/Time   NA 135 05/05/2024 1312   NA 141 09/07/2014 1449   K 4.3 05/05/2024 1312   K 4.0 09/07/2014 1449   CL 101 05/05/2024 1312   CL 106 09/07/2014 1449   CO2 26 05/05/2024 1312   CO2 29 09/07/2014 1449   GLUCOSE 87 05/05/2024 1312   GLUCOSE 109 (H) 09/07/2014 1449   BUN 20 05/05/2024 1312   BUN 19 (H) 09/07/2014 1449   CREATININE 0.89 05/05/2024 1312   CREATININE 1.01 09/07/2014 1449   CALCIUM  8.8 (L) 05/05/2024 1312   CALCIUM  8.8 09/07/2014 1449   PROT 6.3 (L) 05/05/2024 1312   PROT 6.3 09/02/2015 0824   PROT 6.5 09/07/2014 1449   ALBUMIN 3.7 05/05/2024 1312   ALBUMIN 4.2 09/02/2015 0824   ALBUMIN 3.8  09/07/2014 1449   AST 16 05/05/2024 1312   ALT 8 05/05/2024 1312   ALT 22 09/07/2014 1449   ALKPHOS 62 05/05/2024 1312   ALKPHOS 75 09/07/2014 1449   BILITOT 0.7 05/05/2024 1312   GFRNONAA >60 05/05/2024 1312   GFRNONAA 56 (L) 09/07/2014 1449   GFRNONAA 44 (L) 02/09/2014 1351   GFRAA 59 (L) 02/27/2020 1023   GFRAA >60 09/07/2014 1449   GFRAA 51 (L) 02/09/2014 1351    No results found for: SPEP, UPEP  Lab Results  Component Value Date   WBC 5.1 05/05/2024   NEUTROABS 2.1 05/05/2024   HGB 11.7 (L) 05/05/2024   HCT 34.8 (L) 05/05/2024   MCV 92.3 05/05/2024   PLT 131 (L) 05/05/2024      Chemistry      Component Value Date/Time   NA 135 05/05/2024 1312   NA 141 09/07/2014 1449   K 4.3 05/05/2024 1312   K 4.0 09/07/2014 1449   CL 101 05/05/2024 1312   CL 106 09/07/2014 1449   CO2 26 05/05/2024 1312   CO2 29 09/07/2014 1449   BUN 20 05/05/2024 1312   BUN 19 (H) 09/07/2014 1449   CREATININE 0.89 05/05/2024 1312   CREATININE 1.01 09/07/2014 1449      Component Value Date/Time   CALCIUM  8.8 (L) 05/05/2024 1312   CALCIUM  8.8 09/07/2014 1449   ALKPHOS 62 05/05/2024 1312   ALKPHOS 75 09/07/2014 1449   AST 16 05/05/2024 1312   ALT 8 05/05/2024 1312  ALT 22 09/07/2014 1449   BILITOT 0.7 05/05/2024 1312      RADIOGRAPHIC STUDIES: I have personally reviewed the radiological images as listed and agreed with the findings in the report. No results found.   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 Status post multiple lines of therapy in the past; October 2022 finished -s/p Rituxan  weely x4.  Jan 11, 2023-  Progressive bilateral axillary and subpectoral lymphadenopathy ([place] 4);  Small bilateral cervical lymph nodes with low level hypermetabolism ; Enlarging retroperitoneal and pelvic lymph nodes ([place] 4); Right inguinal lymph nodes  with low level hypermetabolism.  No findings for osseous lymphoma-  s/p rituximab  weekly infusion x 4 [finished JUNE 2024.] 10/04/ 2024--PET scan- complete response.    #  04/29/2024- PET scan: Small hypermetabolic pulmonary nodules (or intrapulmonary lymph nodes) and small pleural lesion in the chest as discussed above. Findings are indeterminate but somewhat suspicious for recurrent lymphoma. Recommend follow-up PET-CT in 6 months or so to reassess.No enlarged or hypermetabolic lymph nodes in the neck, abdomen or pelvis;  No findings for osseous lymphoma.continue surveillance. Stable.   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 MAY 2025-  infusion IVIG monthly x3- currently-  awaiting levels-if significantly low would recommend IVIG.  # Fatigue: ? Lymphoma vs other- cardiac [Dr.Arida; Feb 2024]-status post re-evaluation with cardiology;  March 08, 2023 2D echo- Stable.   # Weight loss: UNlikley from Lymphoma [given improvement of PET scan AUG 2023].s/p Nutrition; [place]-Stable.   # Post herpetic neurlagia/Itch- currently pramoxine topical-Stable.   # Vaccinations: Ok with flu shot []  covid/RSV.   # DISPOSITION:  # in 4 month- MD; labs- cbc/CMP: LDH-  quant immunoglobulin; iron studies; ferritin; LDH-  Dr.B  # I reviewed the blood work- with the patient in detail; also reviewed the imaging independently [as summarized above]; and with the patient in detail.       Orders Placed This Encounter  Procedures   CBC with Differential (Cancer Center Only)    Standing Status:   Future    Expected Date:   09/01/2024    Expiration Date:   11/30/2024   CMP (Cancer Center only)    Standing Status:   Future    Expected Date:   09/01/2024    Expiration Date:   11/30/2024    Lactate dehydrogenase    Standing Status:   Future    Expected Date:   09/01/2024    Expiration Date:   11/30/2024   Iron and TIBC    Standing Status:   Future    Expected Date:   09/01/2024    Expiration Date:   11/30/2024   Ferritin    Standing Status:   Future    Expected Date:   09/01/2024    Expiration Date:   11/30/2024   All questions were answered. The patient knows to call the clinic with any problems, questions or concerns.      Cindy JONELLE Joe, MD 05/05/2024 2:25 PM

## 2024-05-06 ENCOUNTER — Other Ambulatory Visit: Payer: Self-pay

## 2024-05-07 LAB — IMMUNOGLOBULINS A/E/G/M, SERUM
IgA: 496 mg/dL — ABNORMAL HIGH (ref 64–422)
IgE (Immunoglobulin E), Serum: <2 [IU]/mL — ABNORMAL LOW (ref 6–495)
IgG (Immunoglobin G), Serum: 553 mg/dL — ABNORMAL LOW (ref 586–1602)
IgM (Immunoglobulin M), Srm: 17 mg/dL — ABNORMAL LOW (ref 26–217)

## 2024-05-08 ENCOUNTER — Ambulatory Visit: Payer: Self-pay | Admitting: Internal Medicine

## 2024-06-20 ENCOUNTER — Other Ambulatory Visit: Payer: Self-pay

## 2024-06-20 ENCOUNTER — Encounter: Payer: Self-pay | Admitting: Internal Medicine

## 2024-06-30 ENCOUNTER — Telehealth: Payer: Self-pay

## 2024-06-30 NOTE — Telephone Encounter (Signed)
 Patient noticed an inner right ear area yesterday that is burning, itching, inflamed yesterday.  Now noticing clear fluid drainage.  The area is not painful.  She was requesting an appt with Dr. B because she thinks the area is infected.

## 2024-06-30 NOTE — Telephone Encounter (Signed)
 Discussed with Dr. B who advise she contact PCP.  Patient notified and agrees with recommendation.

## 2024-09-04 ENCOUNTER — Ambulatory Visit: Attending: Infectious Diseases | Admitting: Infectious Diseases

## 2024-09-04 ENCOUNTER — Encounter: Payer: Self-pay | Admitting: Infectious Diseases

## 2024-09-04 ENCOUNTER — Other Ambulatory Visit
Admission: RE | Admit: 2024-09-04 | Discharge: 2024-09-04 | Disposition: A | Discharge status: Home / Self Care | Source: Ambulatory Visit | Attending: Infectious Diseases | Admitting: Infectious Diseases

## 2024-09-04 VITALS — BP 149/77 | HR 68 | Temp 97.5°F | Ht 60.0 in | Wt 96.6 lb

## 2024-09-04 DIAGNOSIS — S01301A Unspecified open wound of right ear, initial encounter: Secondary | ICD-10-CM | POA: Insufficient documentation

## 2024-09-04 DIAGNOSIS — L98468 Non-pressure chronic ulcer of face with other specified severity: Secondary | ICD-10-CM | POA: Insufficient documentation

## 2024-09-04 DIAGNOSIS — Z85828 Personal history of other malignant neoplasm of skin: Secondary | ICD-10-CM | POA: Diagnosis not present

## 2024-09-04 DIAGNOSIS — Z8572 Personal history of non-Hodgkin lymphomas: Secondary | ICD-10-CM | POA: Insufficient documentation

## 2024-09-04 DIAGNOSIS — B001 Herpesviral vesicular dermatitis: Secondary | ICD-10-CM | POA: Diagnosis not present

## 2024-09-04 DIAGNOSIS — D84821 Immunodeficiency due to drugs: Secondary | ICD-10-CM | POA: Diagnosis not present

## 2024-09-04 DIAGNOSIS — B9789 Other viral agents as the cause of diseases classified elsewhere: Secondary | ICD-10-CM | POA: Insufficient documentation

## 2024-09-04 DIAGNOSIS — L0889 Other specified local infections of the skin and subcutaneous tissue: Secondary | ICD-10-CM | POA: Diagnosis not present

## 2024-09-04 DIAGNOSIS — A609 Anogenital herpesviral infection, unspecified: Secondary | ICD-10-CM | POA: Insufficient documentation

## 2024-09-04 DIAGNOSIS — T451X5A Adverse effect of antineoplastic and immunosuppressive drugs, initial encounter: Secondary | ICD-10-CM | POA: Diagnosis not present

## 2024-09-04 NOTE — Progress Notes (Signed)
 NAME: Jeanne Pittman  DOB: [birth year]  MRN: [id number]  Date/Time: 09/04/2024 9:51 AM  Subjective:  REASON FOR CONSULT:  Pt is here with her son Agreed to the use of AI scribe ? Jeanne Pittman is an 89 year old female with a history of non-Hodgkin's marginal cell lymphoma and herpes infection who presents with a non-healing lesion on her right ear. She is accompanied by her daughter, who is her primary caregiver. She was referred by Dr. Dela, her dermatologist, for evaluation of a persistent herpes infection on her right ear.  She has been experiencing a non-healing lesion on her right ear for nearly two years. The lesion began as a small irritation and has progressively worsened. It itches and burns, and she has been scratching it, which may be contributing to its persistence. A swab taken in April 2025 was positive for herpes infection, but it was not treated at that time. She has not received any valacyclovir  since May 2024.  Her past medical history is significant for non-Hodgkin's marginal cell lymphoma, which has been in remission. She underwent her last round of Rituxan  treatment approximately a year and a half ago, which led to a significant drop in her immunoglobulins . Consequently, she received multiple rounds of IVIG, with the last treatment occurring on March 04, 2024.  She has a history of skin cancer, with previous removals of spots on her legs and ankles. Her oncologist is Dr. Verlin, who has been treating her lymphoma for eighteen years.  Her current medications include carvedilol  for heart rate control, a small dose of losartan  for blood pressure, a low-dose cholesterol medication three times a week, aspirin daily, and Cymbalta  (duloxetine ) since her shingles episode.  Past Medical History:  Diagnosis Date   A-fib (HCC)    07/2012: A. fib with RVR in the setting of myocardial infarction. Converted to sinus rhythm with amiodarone . No recurrence.   Anxiety    Atrophic  vaginitis    Chronic combined systolic (congestive) and diastolic (congestive) heart failure (HCC)    a. EF was 25-35% post MI but improved to 45-50% in 04/2013; b. 03/2017 Echo: EF 40-45%, mod focal/basal hypertrophy of septum. Sev mid-apicalanteroseptal, ant, and apical HK. Gr1 DD, mild AI/MR, mod TR, PASP .   Colon adenomas    Coronary artery disease    a. 07/2012 STEMI complicated by cardiogenic shock. Cath/PCI: LAD 173m (DESx2), RCA 95p(DES). EF 25%; b. 03/2017 MV: EF 57%, fixed apical, periapical, mid to distal anteroseptal defect. No ischemia.   Eczema    Fibrocystic breast disease    Gastritis    GERD (gastroesophageal reflux disease)    Glaucoma    Headache    Hyperlipidemia    Hypertension    Hypothyroidism    Insomnia    Ischemic cardiomyopathy    a. 07/2012 EF 25-35% following MI-->Improved to 45-50%;  b. 03/2017 Echo: EF 40-45%.   Myocardial infarction (HCC) 08/18/2014   Non Hodgkin's lymphoma (HCC) 2005   reoccurance 2007 and 2012   Osteoarthritis    Osteoporosis    Polyneuropathy    Polyposis of colon    Restless leg syndrome    Shingles    right   Urinary, incontinence, stress female     Past Surgical History:  Procedure Laterality Date   ABDOMINAL HYSTERECTOMY  1956   APPENDECTOMY     AXILLARY LYMPH NODE BIOPSY Left 03/18/2015   Procedure: AXILLARY LYMPH NODE BIOPSY;  Surgeon: Reyes LELON Cota, MD;  Location: [name];  Service: General;  Laterality: Left;   CARDIAC CATHETERIZATION  08/19/2012   [name]; [facility]   COLONOSCOPY     COLONOSCOPY WITH PROPOFOL  N/A 03/02/2016   Procedure: COLONOSCOPY WITH PROPOFOL ;  Surgeon: Lamar ONEIDA Holmes, MD;  Location: [facility] ENDOSCOPY;  Service: Endoscopy;  Laterality: N/A;   COLONOSCOPY WITH PROPOFOL  N/A 09/26/2017   Procedure: COLONOSCOPY WITH PROPOFOL ;  Surgeon: Holmes Lamar ONEIDA, MD;  Location: [facility] ENDOSCOPY;  Service: Endoscopy;  Laterality: N/A;   COLONOSCOPY WITH PROPOFOL  N/A 03/11/2020   Procedure: COLONOSCOPY WITH  PROPOFOL ;  Surgeon: Tye Millet, DO;  Location: [name] ENDOSCOPY;  Service: General;  Laterality: N/A;   CORONARY ANGIOPLASTY WITH STENT PLACEMENT     x3 stents   CORONARY ANGIOPLASTY WITH STENT PLACEMENT     CORONARY ARTERY BYPASS GRAFT     ESOPHAGOGASTRODUODENOSCOPY (EGD) WITH PROPOFOL  N/A 03/02/2016   Procedure: ESOPHAGOGASTRODUODENOSCOPY (EGD) WITH PROPOFOL ;  Surgeon: Lamar ONEIDA Holmes, MD;  Location: [facility] ENDOSCOPY;  Service: Endoscopy;  Laterality: N/A;   ESOPHAGOGASTRODUODENOSCOPY (EGD) WITH PROPOFOL  N/A 04/14/2019   Procedure: ESOPHAGOGASTRODUODENOSCOPY (EGD) WITH PROPOFOL ;  Surgeon: Toledo, Ladell POUR, MD;  Location: [name] ENDOSCOPY;  Service: Gastroenterology;  Laterality: N/A;   ESOPHAGOGASTRODUODENOSCOPY (EGD) WITH PROPOFOL  N/A 12/07/2020   Procedure: ESOPHAGOGASTRODUODENOSCOPY (EGD) WITH PROPOFOL ;  Surgeon: Maryruth Ole ONEIDA, MD;  Location: [name] ENDOSCOPY;  Service: Endoscopy;  Laterality: N/A;   LYMPH NODE BIOPSY Right 07/24/2011   Dr Dessa   MOHS SURGERY     bilateral shoulders   MOHS SURGERY Right 02/27/2022   Squamous cell   PTCA     skin cancer removal     TOTAL ABDOMINAL HYSTERECTOMY W/ BILATERAL SALPINGOOPHORECTOMY      Social History   Socioeconomic History   Marital status: Divorced    Spouse name: Not on file   Number of children: Not on file   Years of education: Not on file   Highest education level: Not on file  Occupational History   Not on file  Tobacco Use   Smoking status: Former    Current packs/day: 0.00    Types: Cigarettes    Quit date: 1950    Years since quitting: 76.0   Smokeless tobacco: Never  Vaping Use   Vaping status: Never Used  Substance and Sexual Activity   Alcohol use: No    Alcohol/week: 0.0 standard drinks of alcohol   Drug use: No   Sexual activity: Yes    Birth control/protection: Surgical  Other Topics Concern   Not on file  Social History Narrative   Not on file   Social Drivers of Health   Tobacco Use: Medium  Risk (09/04/2024)   Patient History    Smoking Tobacco Use: Former    Smokeless Tobacco Use: Never    Passive Exposure: Not on Actuary Strain: Low Risk  (10/19/2023)   Received from [facility]   Overall Financial Resource Strain (CARDIA)    Difficulty of Paying Living Expenses: Not hard at all  Food Insecurity: No Food Insecurity (10/19/2023)   Received from [facility]   Epic    Within the past 12 months, you worried that your food would run out before you got the money to buy more.: Never true    Within the past 12 months, the food you bought just didn't last and you didn't have money to get more.: Never true  Transportation Needs: No Transportation Needs (10/19/2023)   Received from [facility]   [facility] - Transportation  In the past 12 months, has lack of transportation kept you from medical appointments or from getting medications?: No    Lack of Transportation (Non-Medical): No  Physical Activity: Not on file  Stress: Not on file  Social Connections: Not on file  Intimate Partner Violence: Not on file  Depression (PHQ2-9): Low Risk (05/05/2024)   Depression (PHQ2-9)    PHQ-2 Score: 0  Alcohol Screen: Not on file  Housing: Unknown (04/30/2024)   Received from [facility]   Epic    In the last 12 months, was there a time when you were not able to pay the mortgage or rent on time?: No    Number of Times Moved in the Last Year: Not on file    At any time in the past 12 months, were you homeless or living in a shelter (including now)?: No  Utilities: Not At Risk (10/19/2023)   Received from [facility] Utilities    Threatened with loss of utilities: No  Health Literacy: Not on file    Family History  Problem Relation Age of Onset   Heart attack Father    Heart disease Brother    Leukemia Mother    Bladder Cancer Neg Hx    Prostate cancer Neg Hx    Kidney cancer  Neg Hx    Breast cancer Neg Hx    Allergies[1] I? Current Outpatient Medications  Medication Sig Dispense Refill   acetaminophen  (TYLENOL ) 650 MG CR tablet Take 650 mg by mouth every 8 (eight) hours as needed for pain.     aspirin 81 MG tablet Take 81 mg by mouth every morning.      carvedilol  (COREG ) 6.25 MG tablet TAKE 1 TABLET BY MOUTH TWICE DAILY WITH A MEAL 180 tablet 3   Cholecalciferol (VITAMIN D-3) 5000 UNITS TABS Take 2,000 Int'l Units by mouth every morning.      cyanocobalamin (VITAMIN B12) 1000 MCG tablet Take 1,000 mcg by mouth.     DULoxetine  (CYMBALTA ) 30 MG capsule Take 1 capsule (30 mg total) by mouth daily. 30 capsule 1   fexofenadine (ALLEGRA) 180 MG tablet Take 180 mg by mouth daily as needed for rhinitis.     ibuprofen (ADVIL) 800 MG tablet Take 800 mg by mouth every 6 (six) hours as needed.     latanoprost (XALATAN) 0.005 % ophthalmic solution Place 1 drop into both eyes at bedtime.     LORazepam  (ATIVAN ) 0.5 MG tablet Take 0.5 mg by mouth every 8 (eight) hours as needed for anxiety.     losartan  (COZAAR ) 25 MG tablet TAKE 1 TABLET BY MOUTH DAILY (Patient taking differently: Take 12.5 mg by mouth daily. Per son takes 1/2 of the 12.5 mg) 90 tablet 3   mupirocin ointment (BACTROBAN) 2 % APPLY TO AFFECTED AREAS TOPICALLY 3 TIMES DAILY FOR SEVEN DAYS     pantoprazole  (PROTONIX ) 40 MG tablet TAKE ONE (1) TABLET BY MOUTH TWO TIMES PER DAY 180 tablet 0   rosuvastatin  (CRESTOR ) 10 MG tablet TAKE 1 TABLET BY MOUTH EVERY OTHER DAY AS DIRECTED. (Patient taking differently: Take 10 mg by mouth. TAKE 1 TABLET BY MOUTH MONDAY, WEDNESDAY AND FRIDAY) 45 tablet 3   timolol (TIMOPTIC) 0.5 % ophthalmic solution Place 1 drop into both eyes 2 (two) times daily.     traMADol (ULTRAM) 50 MG tablet Take by mouth as needed.     triamcinolone cream (KENALOG) 0.1 % Apply topically 2 (two) times daily  No current facility-administered medications for this visit.     Abtx:  Anti-infectives  (From admission, onward)    None       REVIEW OF SYSTEMS:  Const: negative fever, negative chills, negative weight loss Eyes: negative diplopia or visual changes, negative eye pain ENT: negative coryza, negative sore throat Resp: negative cough, hemoptysis, dyspnea Cards: negative for chest pain, palpitations, lower extremity edema GU: negative for frequency, dysuria and hematuria GI: Negative for abdominal pain, diarrhea, bleeding, constipation Skin: negative for rash and pruritus Heme: negative for easy bruising and gum/nose bleeding MS: negative for myalgias, arthralgias, back pain and muscle weakness Neurolo:negative for headaches, dizziness, vertigo, memory problems  Psych: negative for feelings of anxiety, depression  Endocrine: negative for thyroid , diabetes Allergy/Immunology- negative for any medication or food allergies  Objective:  VITALS:  BP (!) 149/77   Pulse 68   Temp (!) 97.5 F (36.4 C) (Temporal)   Ht 5' (1.524 m)   Wt 96 lb 9.6 oz (43.8 kg)   SpO2 98%   BMI 18.87 kg/m  LDA Foley Central line Other drainage tubes PHYSICAL EXAM:  General: Alert, cooperative, no distress, appears stated age.  Head: Normocephalic, without obvious abnormality, atraumatic. Eyes: Conjunctivae clear, anicteric sclerae. Pupils are equal ENT Nares normal. No drainage or sinus tenderness. Lips, mucosa, and tongue normal. No Thrush Neck: Supple, symmetrical, no adenopathy, thyroid : non tender no carotid bruit and no JVD. Back: No CVA tenderness. Lungs: Clear to auscultation bilaterally. No Wheezing or Rhonchi. No rales. Heart: Regular rate and rhythm, no murmur, rub or gallop. Abdomen: Soft, non-tender,not distended. Bowel sounds normal. No masses Extremities: atraumatic, no cyanosis. No edema. No clubbing Skin: No rashes or lesions. Or bruising Lymph: Cervical, supraclavicular normal. Neurologic: Grossly non-focal Pertinent Labs Lab Results CBC    Component Value  Date/Time   WBC 5.1 05/05/2024 1312   WBC 7.3 01/16/2023 1304   RBC 3.77 (L) 05/05/2024 1312   HGB 11.7 (L) 05/05/2024 1312   HGB 13.6 09/07/2014 1449   HCT 34.8 (L) 05/05/2024 1312   HCT 40.0 09/07/2014 1449   PLT 131 (L) 05/05/2024 1312   PLT 147 (L) 09/07/2014 1449   MCV 92.3 05/05/2024 1312   MCV 95 09/07/2014 1449   MCH 31.0 05/05/2024 1312   MCHC 33.6 05/05/2024 1312   RDW 13.6 05/05/2024 1312   RDW 13.7 09/07/2014 1449   LYMPHSABS 2.5 05/05/2024 1312   LYMPHSABS 2.5 09/07/2014 1449   MONOABS 0.3 05/05/2024 1312   MONOABS 0.4 09/07/2014 1449   EOSABS 0.1 05/05/2024 1312   EOSABS 0.2 09/07/2014 1449   BASOSABS 0.0 05/05/2024 1312   BASOSABS 0.1 09/07/2014 1449       Latest Ref Rng & Units 05/05/2024    1:12 PM 03/04/2024    8:18 AM 01/31/2024    8:13 AM  CMP  Glucose 70 - 99 mg/dL 87  871  897   BUN 8 - 23 mg/dL 20  25  32   Creatinine 0.44 - 1.00 mg/dL 9.10  8.93  8.97   Sodium 135 - 145 mmol/L 135  137  139   Potassium 3.5 - 5.1 mmol/L 4.3  4.1  4.0   Chloride 98 - 111 mmol/L 101  105  106   CO2 22 - 32 mmol/L 26  25  25    Calcium  8.9 - 10.3 mg/dL 8.8  9.1  8.8   Total Protein 6.5 - 8.1 g/dL 6.3     Total Bilirubin 0.0 - 1.2  mg/dL 0.7     Alkaline Phos 38 - 126 U/L 62     AST 15 - 41 U/L 16     ALT 0 - 44 U/L 8         Microbiology: No results found for this or any previous visit (from the past 240 hours).  ? 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 Previous treatments with valacyclovir  were ineffective. The infection has led to cartilage damage, likely due to trauma rather than herpes alone. . - Performed culture and swab  ( DNA) to assess viral presence and phenotypic  resistance to acyclovir . - Consulted with Dr. Mardella to check immunoglobulin levels and consider boosting if necessary. - Advised against using triamcinolone acetonide cream; recommended using Vaseline or other moisturizers to protect the area. - Suggested using an ear cover or dressing to prevent further trauma and irritation.   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 Current immunoglobulin levels need assessment to determine if further boosting is necessary to support healing of the herpes infection. - discuss with Dr. Amey to check immunoglobulin levels and consider boosting if necessary. ? Pts son was wondering about a 2nd derm opinion Gave information ______________________________________________ Discussed with patient, requesting provider Note:  This document was prepared using Dragon voice recognition software and may include unintentional dictation errors.     [1]  Allergies Allergen Reactions   Ace Inhibitors     Other reaction(s): Cough   Benadryl  [Diphenhydramine  Hcl]     Other reaction(s): Pt states it makes her wired   Codeine     Other reaction(s): Hallucination   Diphenhydramine      Pt states it makes her wired   Guaiacol    Hydrocodone      Other reaction(s): Hallucination   Cardizem  [Diltiazem ] Rash   Guaifenesin & Derivatives Rash

## 2024-09-04 NOTE — Patient Instructions (Signed)
" °  During your visit, we discussed the non-healing lesion on your right ear, which has been persistent for nearly two years. This lesion has been identified as a chronic herpes simplex infection, likely exacerbated by scratching and touching. We also reviewed your history of immunodeficiency due to previous rituximab  therapy for non-Hodgkin's marginal cell lymphoma.  YOUR PLAN:  -CHRONIC HERPES SIMPLEX INFECTION OF THE RIGHT EAR WITH TRAUMATIC IRRITATION: This is a long-lasting herpes infection on your right ear, worsened by scratching and touching. We performed a culture and swab to check for the virus and its resistance to valacyclovir . You should avoid using triamcinolone acetonide cream and instead use Vaseline  to protect the area. 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 We will check your current immunoglobulin levels and consider boosting them if necessary to help heal the herpes infection.  INSTRUCTIONS:  We will follow up with Dr. Verlin to check your immunoglobulin levels and determine if further boosting is necessary. If you need another derm opinion as requested by your son we can refer you to Dr. Rexene Rattler  "

## 2024-09-05 ENCOUNTER — Other Ambulatory Visit: Payer: Self-pay | Admitting: Infectious Diseases

## 2024-09-05 LAB — HSV DNA BY PCR (REFERENCE LAB)
HSV 1 DNA: POSITIVE — AB
HSV 2 DNA: NEGATIVE

## 2024-09-05 MED ORDER — VALACYCLOVIR HCL 1 G PO TABS: 1000.0000 mg | ORAL_TABLET | Freq: Every day | ORAL | 0 refills | Status: AC

## 2024-09-05 NOTE — Progress Notes (Signed)
 HSV 1 DNA positive from rt ear- sent prescription for valtrex  to [facility]- 1 gram every day X 10 days ( adjusted to crcl of 25) Spoke to patient

## 2024-09-06 LAB — AEROBIC CULTURE W GRAM STAIN (SUPERFICIAL SPECIMEN): Gram Stain: NONE SEEN

## 2024-09-08 ENCOUNTER — Inpatient Hospital Stay: Admitting: Internal Medicine

## 2024-09-08 ENCOUNTER — Telehealth: Payer: Self-pay | Admitting: *Deleted

## 2024-09-08 ENCOUNTER — Inpatient Hospital Stay

## 2024-09-08 ENCOUNTER — Encounter: Payer: Self-pay | Admitting: Internal Medicine

## 2024-09-08 ENCOUNTER — Inpatient Hospital Stay: Attending: Internal Medicine

## 2024-09-08 VITALS — BP 179/89 | HR 84 | Temp 97.5°F | Resp 18 | Ht 60.0 in | Wt 94.9 lb

## 2024-09-08 DIAGNOSIS — C8304 Small cell B-cell lymphoma, lymph nodes of axilla and upper limb: Secondary | ICD-10-CM | POA: Diagnosis not present

## 2024-09-08 DIAGNOSIS — F03B0 Unspecified dementia, moderate, without behavioral disturbance, psychotic disturbance, mood disturbance, and anxiety: Secondary | ICD-10-CM

## 2024-09-08 LAB — CMP (CANCER CENTER ONLY)
ALT: 8 U/L (ref 0–44)
AST: 16 U/L (ref 15–41)
Albumin: 4.4 g/dL (ref 3.5–5.0)
Alkaline Phosphatase: 83 U/L (ref 38–126)
Anion gap: 12 (ref 5–15)
BUN: 25 mg/dL — ABNORMAL HIGH (ref 8–23)
CO2: 24 mmol/L (ref 22–32)
Calcium: 9.8 mg/dL (ref 8.9–10.3)
Chloride: 102 mmol/L (ref 98–111)
Creatinine: 0.91 mg/dL (ref 0.44–1.00)
GFR, Estimated: >60 mL/min
Glucose, Bld: 128 mg/dL — ABNORMAL HIGH (ref 70–99)
Potassium: 3.8 mmol/L (ref 3.5–5.1)
Sodium: 139 mmol/L (ref 135–145)
Total Bilirubin: 0.7 mg/dL (ref 0.0–1.2)
Total Protein: 7.1 g/dL (ref 6.5–8.1)

## 2024-09-08 LAB — CBC WITH DIFFERENTIAL (CANCER CENTER ONLY)
Abs Immature Granulocytes: 0.03 K/uL (ref 0.00–0.07)
Basophils Absolute: 0 K/uL (ref 0.0–0.1)
Basophils Relative: 1 %
Eosinophils Absolute: 0.1 K/uL (ref 0.0–0.5)
Eosinophils Relative: 1 %
HCT: 37.3 % (ref 36.0–46.0)
Hemoglobin: 12.3 g/dL (ref 12.0–15.0)
Immature Granulocytes: 0 %
Lymphocytes Relative: 43 %
Lymphs Abs: 3.2 K/uL (ref 0.7–4.0)
MCH: 30.7 pg (ref 26.0–34.0)
MCHC: 33 g/dL (ref 30.0–36.0)
MCV: 93 fL (ref 80.0–100.0)
Monocytes Absolute: 0.5 K/uL (ref 0.1–1.0)
Monocytes Relative: 7 %
Neutro Abs: 3.6 K/uL (ref 1.7–7.7)
Neutrophils Relative %: 48 %
Platelet Count: 183 K/uL (ref 150–400)
RBC: 4.01 MIL/uL (ref 3.87–5.11)
RDW: 13.7 % (ref 11.5–15.5)
WBC Count: 7.5 K/uL (ref 4.0–10.5)
nRBC: 0 % (ref 0.0–0.2)

## 2024-09-08 LAB — IRON AND TIBC
Iron: 71 ug/dL (ref 28–170)
Saturation Ratios: 17 % (ref 10.4–31.8)
TIBC: 424 ug/dL (ref 250–450)
UIBC: 353 ug/dL

## 2024-09-08 LAB — FERRITIN: Ferritin: 20 ng/mL (ref 11–307)

## 2024-09-08 LAB — LACTATE DEHYDROGENASE: LDH: 164 U/L (ref 105–235)

## 2024-09-08 NOTE — Telephone Encounter (Signed)
 Added these concerns to today's progress note.
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
## 2024-09-08 NOTE — Assessment & Plan Note (Addendum)
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 Status post multiple lines of therapy in the past; October 2022 finished -s/p Rituxan  weely x4.  Jan 11, 2023-  Progressive bilateral axillary and subpectoral lymphadenopathy ([place] 4);  Small bilateral cervical lymph nodes with low level hypermetabolism ; Enlarging retroperitoneal and pelvic lymph nodes ([place] 4); Right inguinal lymph nodes with low level hypermetabolism.  No findings for osseous lymphoma-  s/p rituximab  weekly infusion x 4 [finished JUNE 2024.] 10/04/ 2024--PET scan- complete response.    #  04/29/2024- PET scan: Small hypermetabolic pulmonary nodules (or intrapulmonary lymph nodes) and small pleural lesion in the chest as discussed above. Findings are indeterminate but somewhat suspicious for recurrent lymphoma. Recommend follow-up PET-CT in 6 months or so to reassess.No enlarged or hypermetabolic lymph nodes in the neck, abdomen or pelvis;  No findings for osseous lymphoma.continue surveillance. Stable-n hold off on imaging at this time.  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 MAY 2025-  infusion IVIG monthly x3- currently-September 2025 IgG levels-553; repeat immunoglobulins levels pending today.  Consider IVIG infusion if less than 500.  # Multiple skin cancers-squamous cell carcinoma recommend follow-up with dermatology. [Dr.Dasher]  # Fatigue: ? Lymphoma vs other- cardiac [Dr.Arida; Feb 2024]-status post re-evaluation with cardiology;  March 08, 2023 2D echo- Stable.   # Memory lapses-concern for dementia will refer to neurology.  # Weight loss: UNlikley from Lymphoma [given improvement of PET scan AUG 2023].s/p Nutrition; Joli-Stable.   #  Post herpetic neurlagia/Itch- currently pramoxine topical-Stable.   # Vaccinations: Ok with flu shot []  covid/RSV.   # DISPOSITION:  # lab today-  quant immunoglobulin- please order # referral to Dr.Vaslow re: dementia.   # follow up TBD- Dr.B  # in 4 month- MD; labs- cbc/CMP: [facility]-  quant immunoglobulin; iron studies; ferritin; [facility]-  Dr.B

## 2024-09-08 NOTE — Progress Notes (Signed)
 Son feels like pt's dementia is getter worse and would like referral to a dementia specialist or even refer her to Dr. Buckley.  She has also has a non-healing lesion on her R ear. She is being treated for simplex herpes infection in the outer part of her ear. They have put her on valtrex , which she will start today. The infection disease doctor said she would contact Dr. Rennie to see if patient could be a candidate for additional IVIG treatments.   New dx of neoplasm right achilles. Now has red lesion/bump rt for forearm she would like you to look at that has been there x2 weeks.

## 2024-09-08 NOTE — Progress Notes (Signed)
 " [facility] OFFICE PROGRESS NOTE  Patient Care Team: Auston Reyes BIRCH, MD as PCP - General (Unknown Physician Specialty) Darron Deatrice LABOR, MD as PCP - Cardiology (Cardiology) Byrnett, Reyes ORN, MD (General Surgery) Wilder Pink, MD (Oncology) Darron Deatrice LABOR, MD as Consulting Physician (Cardiology) Rennie Cindy SAUNDERS, MD as Consulting Physician (Oncology)   Cancer Staging  No matching staging information was found for the patient.     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  #May 24, 2021-rituximab  weekly x4; FEB 2023- PET significant partial response.  #MAY 2024- PET progression- June 24th, 2024-  cycle #1 of rituximab  weekly infusion x 4.  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  Alone.   Alisa KANDICE Pouch 89 y.o.  female pleasant patient above history of recurrent low-grade lymphoma/B cell non-Hodgkin-s/p  rituximab  infusion weekly [2024] currently on is here to proceed with IVIG infusion for severe immunodeficiency.  Discussed the use of AI scribe software for clinical note transcription with the patient, who gave verbal consent to proceed.  History of Present Illness   CHEVONNE BOSTROM is an 89 year old female with recurrent marginal zone lymphoma, common variable immunodeficiency, and chronic herpes simplex infection who presents for hematology-oncology follow-up.  She remains under surveillance for marginal zone lymphoma of the axillary lymph node.  She receives intermittent IVIG infusions for secondary immunodeficiency, with her last infusion in July 2025 and the previous one approximately five months prior. Her immunoglobulin level in September 2025 was borderline low at 550. She experienced frequent episodes of bronchitis in the summer and fall of last year, with prolonged recovery, but during the current winter, she has had only a few self-limited upper respiratory infections. She is awaiting updated immunoglobulin levels to determine the need for further IVIG infusions.  She has a chronic, non-healing herpes simplex infection involving the ear, present for approximately one year, refractory to ongoing care. She is initiating valacyclovir  therapy and uses ear covers to prevent excoriation, applying topical medication to the affected area. Her infectious disease physician is involved in management.  She has developed new cutaneous lesions. A lesion on her leg was excised by dermatology and has healed well. A  new lesion on her forearm appeared within the past month. She has not yet been evaluated by dermatology for  this new lesion, and there is concern for possible skin malignancy.  She and her son have noted changes in memory, with occasional lapses and subjective cognitive decline. She lives alone, finds these changes bothersome, and is open to further neurologic evaluation.      Review of Systems  Constitutional:  Positive for malaise/fatigue and weight loss. Negative for chills, diaphoresis and fever.  HENT:  Negative for nosebleeds and sore throat.   Eyes:  Negative for double vision.  Respiratory:  Negative for cough, hemoptysis, sputum production, shortness of breath and wheezing.   Cardiovascular:  Negative for chest pain, palpitations, orthopnea and leg swelling.  Gastrointestinal:  Negative for abdominal pain, blood in stool, constipation, diarrhea, heartburn, melena, nausea and vomiting.  Genitourinary:  Negative for dysuria, frequency and urgency.  Musculoskeletal:  Positive for back pain and joint pain.  Skin: Negative.  Negative for itching and rash.  Neurological:  Negative for dizziness, tingling, focal weakness, weakness and headaches.  Endo/Heme/Allergies:  Does not bruise/bleed easily.  Psychiatric/Behavioral:  Negative for depression. The patient is not nervous/anxious and does not have insomnia.     PAST MEDICAL HISTORY :  Past Medical History:  Diagnosis Date   A-fib (HCC)    07/2012: A. fib with RVR in the setting of myocardial infarction. Converted to sinus rhythm with amiodarone . No recurrence.   Anxiety    Atrophic vaginitis    Chronic combined systolic (congestive) and diastolic (congestive) heart failure (HCC)    a. EF was 25-35% post MI but improved to 45-50% in 04/2013; b. 03/2017 Echo: EF 40-45%, mod focal/basal hypertrophy of septum. Sev mid-apicalanteroseptal, ant, and apical HK. Gr1 DD, mild AI/MR, mod TR, PASP .   Colon adenomas    Coronary artery disease    a. 07/2012 STEMI complicated by cardiogenic shock. Cath/PCI: LAD 175m (DESx2), RCA 95p(DES). EF 25%;  b. 03/2017 MV: EF 57%, fixed apical, periapical, mid to distal anteroseptal defect. No ischemia.   Eczema    Fibrocystic breast disease    Gastritis    GERD (gastroesophageal reflux disease)    Glaucoma    Headache    Hyperlipidemia    Hypertension    Hypothyroidism    Insomnia    Ischemic cardiomyopathy    a. 07/2012 EF 25-35% following MI-->Improved to 45-50%;  b. 03/2017 Echo: EF 40-45%.   Myocardial infarction (HCC) 08/18/2014   Non Hodgkin's lymphoma (HCC) 2005   reoccurance 2007 and 2012   Osteoarthritis    Osteoporosis    Polyneuropathy    Polyposis of colon    Restless leg syndrome    Shingles    right   Urinary, incontinence, stress female     PAST SURGICAL HISTORY :   Past Surgical History:  Procedure Laterality Date   ABDOMINAL HYSTERECTOMY  1956   APPENDECTOMY     AXILLARY LYMPH NODE BIOPSY Left 03/18/2015   Procedure: AXILLARY LYMPH NODE BIOPSY;  Surgeon: Reyes LELON Cota, MD;  Location: [name];  Service: General;  Laterality: Left;   CARDIAC CATHETERIZATION  08/19/2012   [name]; [facility]   COLONOSCOPY     COLONOSCOPY WITH PROPOFOL  N/A 03/02/2016   Procedure: COLONOSCOPY WITH PROPOFOL ;  Surgeon: Lamar ONEIDA Holmes, MD;  Location: [facility] ENDOSCOPY;  Service: Endoscopy;  Laterality: N/A;   COLONOSCOPY WITH PROPOFOL  N/A 09/26/2017   Procedure: COLONOSCOPY WITH PROPOFOL ;  Surgeon: Holmes Lamar ONEIDA, MD;  Location:  [name] ENDOSCOPY;  Service: Endoscopy;  Laterality: N/A;   COLONOSCOPY WITH PROPOFOL  N/A 03/11/2020   Procedure: COLONOSCOPY WITH PROPOFOL ;  Surgeon: Tye Millet, DO;  Location: [name] ENDOSCOPY;  Service: General;  Laterality: N/A;   CORONARY ANGIOPLASTY WITH STENT PLACEMENT     x3 stents   CORONARY ANGIOPLASTY WITH STENT PLACEMENT     CORONARY ARTERY BYPASS GRAFT     ESOPHAGOGASTRODUODENOSCOPY (EGD) WITH PROPOFOL  N/A 03/02/2016   Procedure: ESOPHAGOGASTRODUODENOSCOPY (EGD) WITH PROPOFOL ;  Surgeon: Lamar ONEIDA Holmes, MD;  Location: [facility] ENDOSCOPY;  Service:  Endoscopy;  Laterality: N/A;   ESOPHAGOGASTRODUODENOSCOPY (EGD) WITH PROPOFOL  N/A 04/14/2019   Procedure: ESOPHAGOGASTRODUODENOSCOPY (EGD) WITH PROPOFOL ;  Surgeon: Toledo, Ladell POUR, MD;  Location: [name] ENDOSCOPY;  Service: Gastroenterology;  Laterality: N/A;   ESOPHAGOGASTRODUODENOSCOPY (EGD) WITH PROPOFOL  N/A 12/07/2020   Procedure: ESOPHAGOGASTRODUODENOSCOPY (EGD) WITH PROPOFOL ;  Surgeon: Maryruth Ole ONEIDA, MD;  Location: [name] ENDOSCOPY;  Service: Endoscopy;  Laterality: N/A;   LYMPH NODE BIOPSY Right 07/24/2011   Dr Dessa   MOHS SURGERY     bilateral shoulders   MOHS SURGERY Right 02/27/2022   Squamous cell   PTCA     skin cancer removal     TOTAL ABDOMINAL HYSTERECTOMY W/ BILATERAL SALPINGOOPHORECTOMY      FAMILY HISTORY :   Family History  Problem Relation Age of Onset   Heart attack Father    Heart disease Brother    Leukemia Mother    Bladder Cancer Neg Hx    Prostate cancer Neg Hx    Kidney cancer Neg Hx    Breast cancer Neg Hx     SOCIAL HISTORY:   Social History   Tobacco Use   Smoking status: Former    Current packs/day: 0.00    Types: Cigarettes    Quit date: 1950    Years since quitting: 76.1   Smokeless tobacco: Never  Vaping Use   Vaping status: Never Used  Substance Use Topics   Alcohol use: No    Alcohol/week: 0.0 standard drinks of alcohol   Drug use: No    ALLERGIES:  is allergic to ace inhibitors, benadryl  [diphenhydramine  hcl], codeine, diphenhydramine , guaiacol, hydrocodone , cardizem  [diltiazem ], and guaifenesin & derivatives.  MEDICATIONS:  Current Outpatient Medications  Medication Sig Dispense Refill   acetaminophen  (TYLENOL ) 650 MG CR tablet Take 650 mg by mouth every 8 (eight) hours as needed for pain.     aspirin 81 MG tablet Take 81 mg by mouth every morning.      carvedilol  (COREG ) 6.25 MG tablet TAKE 1 TABLET BY MOUTH TWICE DAILY WITH A MEAL 180 tablet 3   Cholecalciferol (VITAMIN D-3) 5000 UNITS TABS Take 2,000 Int'l Units by  mouth every morning.      cyanocobalamin (VITAMIN B12) 1000 MCG tablet Take 1,000 mcg by mouth.     DULoxetine  (CYMBALTA ) 30 MG capsule Take 1 capsule (30 mg total) by mouth daily. 30 capsule 1   fexofenadine (ALLEGRA) 180 MG tablet Take 180 mg by mouth daily as needed for rhinitis.     ibuprofen (ADVIL) 800 MG tablet Take 800 mg by mouth every 6 (six) hours as needed.     latanoprost (XALATAN) 0.005 % ophthalmic solution Place 1 drop into both eyes at bedtime.     LORazepam  (ATIVAN ) 0.5 MG tablet Take 0.5 mg by mouth every 8 (eight) hours as needed for anxiety.     losartan  (COZAAR ) 25 MG tablet TAKE 1 TABLET BY MOUTH DAILY (Patient taking differently: Take 12.5 mg by mouth  daily. Per son takes 1/2 of the 12.5 mg) 90 tablet 3   timolol (TIMOPTIC) 0.5 % ophthalmic solution Place 1 drop into both eyes 2 (two) times daily.     traMADol (ULTRAM) 50 MG tablet Take by mouth as needed.     valACYclovir  (VALTREX ) 1000 MG tablet Take 1 tablet (1,000 mg total) by mouth daily. 10 tablet 0   mupirocin ointment (BACTROBAN) 2 % APPLY TO AFFECTED AREAS TOPICALLY 3 TIMES DAILY FOR SEVEN DAYS     pantoprazole  (PROTONIX ) 40 MG tablet TAKE ONE (1) TABLET BY MOUTH TWO TIMES PER DAY 180 tablet 0   rosuvastatin  (CRESTOR ) 10 MG tablet TAKE 1 TABLET BY MOUTH EVERY OTHER DAY AS DIRECTED. (Patient taking differently: Take 10 mg by mouth. TAKE 1 TABLET BY MOUTH MONDAY, WEDNESDAY AND FRIDAY) 45 tablet 3   No current facility-administered medications for this visit.    PHYSICAL EXAMINATION: ECOG PERFORMANCE STATUS: 0 - Asymptomatic  BP (!) 179/89 (BP Location: Left Arm, Patient Position: Sitting, Cuff Size: Small) Comment: pt advised bp elevated, keep check at home, contact pcp if con't to stay elevated  Pulse 84   Temp (!) 97.5 F (36.4 C) (Tympanic)   Resp 18   Ht 5' (1.524 m)   Wt 94 lb 14.4 oz (43 kg)   SpO2 100%   BMI 18.53 kg/m   Filed Weights   09/08/24 1308  Weight: 94 lb 14.4 oz (43 kg)   No  lymphadenopathy noted.  Physical Exam HENT:     Head: Normocephalic and atraumatic.     Mouth/Throat:     Pharynx: No oropharyngeal exudate.  Eyes:     Pupils: Pupils are equal, round, and reactive to light.  Cardiovascular:     Rate and Rhythm: Normal rate and regular rhythm.  Pulmonary:     Effort: No respiratory distress.     Breath sounds: No wheezing.  Abdominal:     General: Bowel sounds are normal. There is no distension.     Palpations: Abdomen is soft. There is no mass.     Tenderness: There is no abdominal tenderness. There is no guarding or rebound.  Musculoskeletal:        General: No tenderness. Normal range of motion.     Cervical back: Normal range of motion and neck supple.  Skin:    General: Skin is warm.  Neurological:     Mental Status: She is alert and oriented to person, place, and time.  Psychiatric:        Mood and Affect: Affect normal.      LABORATORY DATA:  I have reviewed the data as listed    Component Value Date/Time   NA 139 09/08/2024 1257   NA 141 09/07/2014 1449   K 3.8 09/08/2024 1257   K 4.0 09/07/2014 1449   CL 102 09/08/2024 1257   CL 106 09/07/2014 1449   CO2 24 09/08/2024 1257   CO2 29 09/07/2014 1449   GLUCOSE 128 (H) 09/08/2024 1257   GLUCOSE 109 (H) 09/07/2014 1449   BUN 25 (H) 09/08/2024 1257   BUN 19 (H) 09/07/2014 1449   CREATININE 0.91 09/08/2024 1257   CREATININE 1.01 09/07/2014 1449   CALCIUM  9.8 09/08/2024 1257   CALCIUM  8.8 09/07/2014 1449   PROT 7.1 09/08/2024 1257   PROT 6.3 09/02/2015 0824   PROT 6.5 09/07/2014 1449   ALBUMIN 4.4 09/08/2024 1257   ALBUMIN 4.2 09/02/2015 0824   ALBUMIN 3.8 09/07/2014 1449   AST 16  09/08/2024 1257   ALT 8 09/08/2024 1257   ALT 22 09/07/2014 1449   ALKPHOS 83 09/08/2024 1257   ALKPHOS 75 09/07/2014 1449   BILITOT 0.7 09/08/2024 1257   GFRNONAA >60 09/08/2024 1257   GFRNONAA 56 (L) 09/07/2014 1449   GFRNONAA 44 (L) 02/09/2014 1351   GFRAA 59 (L) 02/27/2020 1023   GFRAA  >60 09/07/2014 1449   GFRAA 51 (L) 02/09/2014 1351    No results found for: SPEP, UPEP  Lab Results  Component Value Date   WBC 7.5 09/08/2024   NEUTROABS 3.6 09/08/2024   HGB 12.3 09/08/2024   HCT 37.3 09/08/2024   MCV 93.0 09/08/2024   PLT 183 09/08/2024      Chemistry      Component Value Date/Time   NA 139 09/08/2024 1257   NA 141 09/07/2014 1449   K 3.8 09/08/2024 1257   K 4.0 09/07/2014 1449   CL 102 09/08/2024 1257   CL 106 09/07/2014 1449   CO2 24 09/08/2024 1257   CO2 29 09/07/2014 1449   BUN 25 (H) 09/08/2024 1257   BUN 19 (H) 09/07/2014 1449   CREATININE 0.91 09/08/2024 1257   CREATININE 1.01 09/07/2014 1449      Component Value Date/Time   CALCIUM  9.8 09/08/2024 1257   CALCIUM  8.8 09/07/2014 1449   ALKPHOS 83 09/08/2024 1257   ALKPHOS 75 09/07/2014 1449   AST 16 09/08/2024 1257   ALT 8 09/08/2024 1257   ALT 22 09/07/2014 1449   BILITOT 0.7 09/08/2024 1257      RADIOGRAPHIC STUDIES: I have personally reviewed the radiological images as listed and agreed with the findings in the report. No results found.   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 Status post multiple lines of therapy in the past; October 2022 finished -s/p Rituxan  weely x4.  Jan 11, 2023-  Progressive bilateral axillary and subpectoral lymphadenopathy ([place] 4);  Small bilateral cervical lymph nodes with low level hypermetabolism ; Enlarging retroperitoneal and pelvic lymph nodes ([place] 4); Right inguinal lymph nodes with low level hypermetabolism.  No findings for osseous lymphoma-  s/p rituximab  weekly infusion x 4 [finished JUNE 2024.] 10/04/ 2024--PET scan- complete response.    #  04/29/2024- PET scan: Small hypermetabolic pulmonary nodules (or intrapulmonary lymph nodes) and small pleural lesion in the chest as discussed above. Findings are  indeterminate but somewhat suspicious for recurrent lymphoma. Recommend follow-up PET-CT in 6 months or so to reassess.No enlarged or hypermetabolic lymph nodes in the neck, abdomen or pelvis;  No findings for osseous lymphoma.continue surveillance. Stable-n hold off on imaging at this time.  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 MAY 2025-  infusion IVIG monthly x3- currently-September 2025 IgG levels-553; repeat immunoglobulins levels pending today.  Consider IVIG infusion if less than 500.  # Multiple skin cancers-squamous cell carcinoma recommend follow-up with dermatology. [Dr.Dasher]  # Fatigue: ? Lymphoma vs other- cardiac [Dr.Arida; Feb 2024]-status post re-evaluation with cardiology;  March 08, 2023 2D echo- Stable.   # Memory lapses-concern for dementia will refer to neurology.  # Weight loss: UNlikley from Lymphoma [given improvement of PET scan AUG 2023].s/p Nutrition; Joli-Stable.   # Post herpetic neurlagia/Itch-  currently pramoxine topical-Stable.   # Vaccinations: Ok with flu shot []  covid/RSV.   # DISPOSITION:  # lab today-  quant immunoglobulin- please order # referral to Dr.Vaslow re: dementia.   # follow up TBD- Dr.B  # in 4 month- MD; labs- cbc/CMP: [facility]-  quant immunoglobulin; iron studies; ferritin; [facility]-  Dr.B       Orders Placed This Encounter  Procedures   Immunoglobulins, QN, A/E/G/M    Standing Status:   Future    Number of Occurrences:   1    Expected Date:   09/08/2024    Expiration Date:   09/08/2025   Ambulatory referral to Neurology    Referral Priority:   Routine    Referral Type:   Consultation    Referral Reason:   Specialty Services Required    Referred to Provider:   Vaslow, Zachary K, MD    Requested  Specialty:   Neurology    Number of Visits Requested:   1   All questions were answered. The patient knows to call the clinic with any problems, questions or concerns.      Cindy JONELLE Joe, MD 09/08/2024 2:14 PM "

## 2024-09-09 ENCOUNTER — Other Ambulatory Visit: Payer: Self-pay

## 2024-09-10 ENCOUNTER — Other Ambulatory Visit: Payer: Self-pay

## 2024-09-10 ENCOUNTER — Ambulatory Visit: Payer: Self-pay | Admitting: Internal Medicine

## 2024-09-10 ENCOUNTER — Other Ambulatory Visit: Payer: Self-pay | Admitting: *Deleted

## 2024-09-10 DIAGNOSIS — C8304 Small cell B-cell lymphoma, lymph nodes of axilla and upper limb: Secondary | ICD-10-CM

## 2024-09-10 LAB — IMMUNOGLOBULINS A/E/G/M, SERUM
IgA: 669 mg/dL — ABNORMAL HIGH (ref 64–422)
IgE (Immunoglobulin E), Serum: <2 [IU]/mL — ABNORMAL LOW (ref 6–495)
IgG (Immunoglobin G), Serum: 268 mg/dL — ABNORMAL LOW (ref 586–1602)
IgM (Immunoglobulin M), Srm: 59 mg/dL (ref 26–217)

## 2024-09-11 ENCOUNTER — Other Ambulatory Visit: Payer: Self-pay

## 2024-09-12 ENCOUNTER — Inpatient Hospital Stay: Admitting: Internal Medicine

## 2024-09-12 ENCOUNTER — Inpatient Hospital Stay

## 2024-09-21 ENCOUNTER — Other Ambulatory Visit: Payer: Self-pay

## 2024-09-22 LAB — MISC LABCORP TEST (SEND OUT): Labcorp test code: 138370

## 2024-09-29 ENCOUNTER — Inpatient Hospital Stay: Attending: Internal Medicine

## 2024-10-17 ENCOUNTER — Inpatient Hospital Stay

## 2024-10-17 ENCOUNTER — Inpatient Hospital Stay: Admitting: Internal Medicine

## 2024-10-27 ENCOUNTER — Inpatient Hospital Stay

## 2024-11-24 ENCOUNTER — Inpatient Hospital Stay

## 2024-11-24 ENCOUNTER — Inpatient Hospital Stay: Admitting: Internal Medicine
